# Patient Record
Sex: Female | Born: 1945
Health system: Southern US, Community
[De-identification: ages and names within clinical notes are randomized; demographics above are authoritative.]

## PROBLEM LIST (undated history)

## (undated) DIAGNOSIS — F438 Other reactions to severe stress: Secondary | ICD-10-CM

## (undated) DIAGNOSIS — C801 Malignant (primary) neoplasm, unspecified: Secondary | ICD-10-CM

## (undated) DIAGNOSIS — G5600 Carpal tunnel syndrome, unspecified upper limb: Secondary | ICD-10-CM

## (undated) DIAGNOSIS — Z86718 Personal history of other venous thrombosis and embolism: Secondary | ICD-10-CM

## (undated) DIAGNOSIS — Z7901 Long term (current) use of anticoagulants: Secondary | ICD-10-CM

## (undated) DIAGNOSIS — K219 Gastro-esophageal reflux disease without esophagitis: Secondary | ICD-10-CM

## (undated) DIAGNOSIS — R002 Palpitations: Secondary | ICD-10-CM

## (undated) DIAGNOSIS — E049 Nontoxic goiter, unspecified: Secondary | ICD-10-CM

## (undated) DIAGNOSIS — K8020 Calculus of gallbladder without cholecystitis without obstruction: Secondary | ICD-10-CM

## (undated) DIAGNOSIS — N189 Chronic kidney disease, unspecified: Secondary | ICD-10-CM

## (undated) DIAGNOSIS — F5089 Other specified eating disorder: Secondary | ICD-10-CM

## (undated) DIAGNOSIS — M1990 Unspecified osteoarthritis, unspecified site: Secondary | ICD-10-CM

## (undated) DIAGNOSIS — M353 Polymyalgia rheumatica: Secondary | ICD-10-CM

## (undated) DIAGNOSIS — R011 Cardiac murmur, unspecified: Secondary | ICD-10-CM

## (undated) DIAGNOSIS — I10 Essential (primary) hypertension: Secondary | ICD-10-CM

## (undated) DIAGNOSIS — Z86711 Personal history of pulmonary embolism: Secondary | ICD-10-CM

## (undated) DIAGNOSIS — I2699 Other pulmonary embolism without acute cor pulmonale: Secondary | ICD-10-CM

## (undated) DIAGNOSIS — M7712 Lateral epicondylitis, left elbow: Secondary | ICD-10-CM

## (undated) DIAGNOSIS — IMO0002 Reserved for concepts with insufficient information to code with codable children: Secondary | ICD-10-CM

## (undated) DIAGNOSIS — M546 Pain in thoracic spine: Secondary | ICD-10-CM

## (undated) DIAGNOSIS — T7840XA Allergy, unspecified, initial encounter: Secondary | ICD-10-CM

## (undated) DIAGNOSIS — D509 Iron deficiency anemia, unspecified: Secondary | ICD-10-CM

## (undated) DIAGNOSIS — E119 Type 2 diabetes mellitus without complications: Secondary | ICD-10-CM

## (undated) DIAGNOSIS — M65342 Trigger finger, left ring finger: Secondary | ICD-10-CM

## (undated) DIAGNOSIS — Z86010 Personal history of colonic polyps: Secondary | ICD-10-CM

## (undated) DIAGNOSIS — N19 Unspecified kidney failure: Secondary | ICD-10-CM

## (undated) DIAGNOSIS — E079 Disorder of thyroid, unspecified: Secondary | ICD-10-CM

## (undated) DIAGNOSIS — E785 Hyperlipidemia, unspecified: Secondary | ICD-10-CM

## (undated) DIAGNOSIS — D352 Benign neoplasm of pituitary gland: Secondary | ICD-10-CM

## (undated) HISTORY — DX: Personal history of other venous thrombosis and embolism: Z86.718

## (undated) HISTORY — PX: KNEE ARTHROSCOPY: SUR90

## (undated) HISTORY — DX: Type 2 diabetes mellitus without complications: E11.9

## (undated) HISTORY — DX: Essential (primary) hypertension: I10

## (undated) HISTORY — DX: Nontoxic goiter, unspecified: E04.9

## (undated) HISTORY — DX: Unspecified kidney failure: N19

## (undated) HISTORY — DX: Benign neoplasm of pituitary gland: D35.2

## (undated) HISTORY — DX: Trigger finger, left ring finger: M65.342

## (undated) HISTORY — DX: Gastro-esophageal reflux disease without esophagitis: K21.9

## (undated) HISTORY — PX: TUBAL LIGATION: SHX77

## (undated) HISTORY — DX: Pain in thoracic spine: M54.6

## (undated) HISTORY — DX: Carpal tunnel syndrome, unspecified upper limb: G56.00

## (undated) HISTORY — DX: Disorder of thyroid, unspecified: E07.9

## (undated) HISTORY — DX: Allergy, unspecified, initial encounter: T78.40XA

## (undated) HISTORY — DX: Lateral epicondylitis, left elbow: M77.12

## (undated) HISTORY — DX: Personal history of colonic polyps: Z86.010

## (undated) HISTORY — PX: UPPER GASTROINTESTINAL ENDOSCOPY: SHX188

## (undated) HISTORY — DX: Calculus of gallbladder without cholecystitis without obstruction: K80.20

## (undated) HISTORY — DX: Reserved for concepts with insufficient information to code with codable children: IMO0002

## (undated) HISTORY — DX: Iron deficiency anemia, unspecified: D50.9

## (undated) HISTORY — DX: Other reactions to severe stress: F43.8

## (undated) HISTORY — DX: Other pulmonary embolism without acute cor pulmonale: I26.99

## (undated) HISTORY — DX: Other specified eating disorder: F50.89

## (undated) HISTORY — DX: Polymyalgia rheumatica: M35.3

## (undated) HISTORY — DX: Chronic kidney disease, unspecified: N18.9

## (undated) HISTORY — DX: Unspecified osteoarthritis, unspecified site: M19.90

## (undated) HISTORY — DX: Long term (current) use of anticoagulants: Z79.01

## (undated) HISTORY — DX: Palpitations: R00.2

## (undated) HISTORY — PX: ABDOMINAL HYSTERECTOMY: SHX81

## (undated) HISTORY — PX: COLONOSCOPY: SHX174

## (undated) HISTORY — DX: Personal history of pulmonary embolism: Z86.711

## (undated) HISTORY — PX: OTHER SURGICAL HISTORY: SHX169

## (undated) HISTORY — DX: Hyperlipidemia, unspecified: E78.5

---

## 1994-08-11 HISTORY — PX: CARPAL TUNNEL RELEASE: SHX101

## 1999-08-26 ENCOUNTER — Ambulatory Visit (HOSPITAL_COMMUNITY): Admission: RE | Admit: 1999-08-26 | Discharge: 1999-08-26 | Payer: Self-pay | Admitting: Internal Medicine

## 1999-08-26 ENCOUNTER — Encounter: Payer: Self-pay | Admitting: Internal Medicine

## 2000-06-10 ENCOUNTER — Other Ambulatory Visit: Admission: RE | Admit: 2000-06-10 | Discharge: 2000-06-10 | Payer: Self-pay | Admitting: Obstetrics and Gynecology

## 2000-07-31 ENCOUNTER — Encounter: Payer: Self-pay | Admitting: Emergency Medicine

## 2000-07-31 ENCOUNTER — Emergency Department (HOSPITAL_COMMUNITY): Admission: EM | Admit: 2000-07-31 | Discharge: 2000-07-31 | Payer: Self-pay | Admitting: Emergency Medicine

## 2000-10-24 ENCOUNTER — Ambulatory Visit (HOSPITAL_COMMUNITY): Admission: RE | Admit: 2000-10-24 | Discharge: 2000-10-24 | Payer: Self-pay | Admitting: Internal Medicine

## 2000-10-24 ENCOUNTER — Encounter: Payer: Self-pay | Admitting: Internal Medicine

## 2001-07-07 ENCOUNTER — Other Ambulatory Visit: Admission: RE | Admit: 2001-07-07 | Discharge: 2001-07-07 | Payer: Self-pay | Admitting: Obstetrics and Gynecology

## 2001-09-24 ENCOUNTER — Encounter: Payer: Self-pay | Admitting: Emergency Medicine

## 2001-09-24 ENCOUNTER — Emergency Department (HOSPITAL_COMMUNITY): Admission: EM | Admit: 2001-09-24 | Discharge: 2001-09-24 | Payer: Self-pay | Admitting: Emergency Medicine

## 2001-11-10 ENCOUNTER — Encounter: Admission: RE | Admit: 2001-11-10 | Discharge: 2001-11-10 | Payer: Self-pay | Admitting: Family Medicine

## 2001-12-14 ENCOUNTER — Encounter: Admission: RE | Admit: 2001-12-14 | Discharge: 2001-12-14 | Payer: Self-pay | Admitting: Family Medicine

## 2002-01-29 ENCOUNTER — Encounter: Admission: RE | Admit: 2002-01-29 | Discharge: 2002-01-29 | Payer: Self-pay | Admitting: Orthopedic Surgery

## 2002-01-29 ENCOUNTER — Encounter: Payer: Self-pay | Admitting: Orthopedic Surgery

## 2002-02-14 ENCOUNTER — Encounter: Admission: RE | Admit: 2002-02-14 | Discharge: 2002-02-14 | Payer: Self-pay | Admitting: Sports Medicine

## 2002-03-02 ENCOUNTER — Encounter: Admission: RE | Admit: 2002-03-02 | Discharge: 2002-03-02 | Payer: Self-pay | Admitting: Family Medicine

## 2002-03-08 ENCOUNTER — Ambulatory Visit (HOSPITAL_COMMUNITY): Admission: RE | Admit: 2002-03-08 | Discharge: 2002-03-08 | Payer: Self-pay | Admitting: Cardiology

## 2002-03-10 ENCOUNTER — Encounter: Payer: Self-pay | Admitting: *Deleted

## 2002-03-10 ENCOUNTER — Ambulatory Visit (HOSPITAL_COMMUNITY): Admission: RE | Admit: 2002-03-10 | Discharge: 2002-03-10 | Payer: Self-pay | Admitting: *Deleted

## 2002-04-07 ENCOUNTER — Encounter: Admission: RE | Admit: 2002-04-07 | Discharge: 2002-04-07 | Payer: Self-pay | Admitting: Family Medicine

## 2002-05-04 ENCOUNTER — Encounter: Admission: RE | Admit: 2002-05-04 | Discharge: 2002-05-04 | Payer: Self-pay | Admitting: Family Medicine

## 2002-06-15 ENCOUNTER — Encounter: Admission: RE | Admit: 2002-06-15 | Discharge: 2002-06-15 | Payer: Self-pay | Admitting: Family Medicine

## 2002-07-12 ENCOUNTER — Other Ambulatory Visit: Admission: RE | Admit: 2002-07-12 | Discharge: 2002-07-12 | Payer: Self-pay | Admitting: Obstetrics and Gynecology

## 2002-08-26 ENCOUNTER — Encounter: Admission: RE | Admit: 2002-08-26 | Discharge: 2002-08-26 | Payer: Self-pay | Admitting: Family Medicine

## 2002-11-04 ENCOUNTER — Encounter: Admission: RE | Admit: 2002-11-04 | Discharge: 2002-11-04 | Payer: Self-pay | Admitting: Family Medicine

## 2002-11-07 ENCOUNTER — Encounter: Admission: RE | Admit: 2002-11-07 | Discharge: 2002-11-07 | Payer: Self-pay | Admitting: Family Medicine

## 2002-11-08 ENCOUNTER — Encounter: Admission: RE | Admit: 2002-11-08 | Discharge: 2002-11-08 | Payer: Self-pay | Admitting: Family Medicine

## 2002-11-11 ENCOUNTER — Emergency Department (HOSPITAL_COMMUNITY): Admission: EM | Admit: 2002-11-11 | Discharge: 2002-11-11 | Payer: Self-pay | Admitting: Emergency Medicine

## 2002-11-11 ENCOUNTER — Encounter: Payer: Self-pay | Admitting: Emergency Medicine

## 2002-12-20 ENCOUNTER — Encounter: Admission: RE | Admit: 2002-12-20 | Discharge: 2002-12-20 | Payer: Self-pay | Admitting: Family Medicine

## 2002-12-27 ENCOUNTER — Encounter: Admission: RE | Admit: 2002-12-27 | Discharge: 2002-12-27 | Payer: Self-pay | Admitting: Family Medicine

## 2003-01-06 ENCOUNTER — Ambulatory Visit (HOSPITAL_BASED_OUTPATIENT_CLINIC_OR_DEPARTMENT_OTHER): Admission: RE | Admit: 2003-01-06 | Discharge: 2003-01-06 | Payer: Self-pay | Admitting: Orthopedic Surgery

## 2003-01-18 ENCOUNTER — Ambulatory Visit (HOSPITAL_COMMUNITY): Admission: RE | Admit: 2003-01-18 | Discharge: 2003-01-18 | Payer: Self-pay | Admitting: *Deleted

## 2003-01-18 ENCOUNTER — Encounter: Admission: RE | Admit: 2003-01-18 | Discharge: 2003-01-18 | Payer: Self-pay | Admitting: Family Medicine

## 2003-01-26 ENCOUNTER — Encounter: Admission: RE | Admit: 2003-01-26 | Discharge: 2003-01-26 | Payer: Self-pay | Admitting: Family Medicine

## 2003-04-25 ENCOUNTER — Encounter: Admission: RE | Admit: 2003-04-25 | Discharge: 2003-04-25 | Payer: Self-pay | Admitting: Sports Medicine

## 2003-07-11 ENCOUNTER — Ambulatory Visit (HOSPITAL_COMMUNITY): Admission: RE | Admit: 2003-07-11 | Discharge: 2003-07-11 | Payer: Self-pay | Admitting: Orthopedic Surgery

## 2003-08-02 ENCOUNTER — Encounter: Admission: RE | Admit: 2003-08-02 | Discharge: 2003-08-02 | Payer: Self-pay | Admitting: Sports Medicine

## 2003-08-07 ENCOUNTER — Other Ambulatory Visit: Admission: RE | Admit: 2003-08-07 | Discharge: 2003-08-07 | Payer: Self-pay | Admitting: Obstetrics and Gynecology

## 2003-10-19 ENCOUNTER — Encounter: Admission: RE | Admit: 2003-10-19 | Discharge: 2003-10-19 | Payer: Self-pay | Admitting: Sports Medicine

## 2004-03-28 ENCOUNTER — Encounter: Admission: RE | Admit: 2004-03-28 | Discharge: 2004-03-28 | Payer: Self-pay | Admitting: Sports Medicine

## 2004-04-26 ENCOUNTER — Ambulatory Visit: Payer: Self-pay | Admitting: Family Medicine

## 2004-08-19 ENCOUNTER — Ambulatory Visit: Payer: Self-pay | Admitting: Family Medicine

## 2004-12-04 ENCOUNTER — Ambulatory Visit: Payer: Self-pay | Admitting: Family Medicine

## 2004-12-10 ENCOUNTER — Ambulatory Visit: Payer: Self-pay | Admitting: Family Medicine

## 2004-12-10 ENCOUNTER — Ambulatory Visit (HOSPITAL_COMMUNITY): Admission: RE | Admit: 2004-12-10 | Discharge: 2004-12-10 | Payer: Self-pay | Admitting: Family Medicine

## 2004-12-11 ENCOUNTER — Ambulatory Visit: Payer: Self-pay | Admitting: Family Medicine

## 2005-01-07 ENCOUNTER — Ambulatory Visit: Payer: Self-pay | Admitting: Family Medicine

## 2005-01-27 ENCOUNTER — Ambulatory Visit: Payer: Self-pay | Admitting: Family Medicine

## 2005-06-20 ENCOUNTER — Ambulatory Visit: Payer: Self-pay | Admitting: Family Medicine

## 2005-07-07 ENCOUNTER — Other Ambulatory Visit: Admission: RE | Admit: 2005-07-07 | Discharge: 2005-07-07 | Payer: Self-pay | Admitting: Obstetrics and Gynecology

## 2005-07-09 ENCOUNTER — Ambulatory Visit (HOSPITAL_BASED_OUTPATIENT_CLINIC_OR_DEPARTMENT_OTHER): Admission: RE | Admit: 2005-07-09 | Discharge: 2005-07-09 | Payer: Self-pay | Admitting: Orthopedic Surgery

## 2005-07-09 ENCOUNTER — Ambulatory Visit (HOSPITAL_COMMUNITY): Admission: RE | Admit: 2005-07-09 | Discharge: 2005-07-09 | Payer: Self-pay | Admitting: Orthopedic Surgery

## 2005-09-01 ENCOUNTER — Ambulatory Visit: Payer: Self-pay | Admitting: Sports Medicine

## 2005-09-08 ENCOUNTER — Ambulatory Visit (HOSPITAL_COMMUNITY): Admission: RE | Admit: 2005-09-08 | Discharge: 2005-09-08 | Payer: Self-pay | Admitting: Family Medicine

## 2005-10-24 ENCOUNTER — Ambulatory Visit: Payer: Self-pay | Admitting: Sports Medicine

## 2005-11-06 ENCOUNTER — Encounter: Admission: RE | Admit: 2005-11-06 | Discharge: 2005-11-06 | Payer: Self-pay | Admitting: Sports Medicine

## 2005-11-06 ENCOUNTER — Ambulatory Visit: Payer: Self-pay | Admitting: Family Medicine

## 2006-01-15 ENCOUNTER — Emergency Department (HOSPITAL_COMMUNITY): Admission: EM | Admit: 2006-01-15 | Discharge: 2006-01-15 | Payer: Self-pay | Admitting: Emergency Medicine

## 2006-05-18 ENCOUNTER — Ambulatory Visit: Payer: Self-pay | Admitting: Family Medicine

## 2006-05-21 ENCOUNTER — Ambulatory Visit: Payer: Self-pay | Admitting: Family Medicine

## 2006-06-04 ENCOUNTER — Encounter: Admission: RE | Admit: 2006-06-04 | Discharge: 2006-06-26 | Payer: Self-pay | Admitting: Chiropractic Medicine

## 2006-06-04 ENCOUNTER — Ambulatory Visit: Payer: Self-pay | Admitting: Family Medicine

## 2006-06-22 ENCOUNTER — Ambulatory Visit: Payer: Self-pay | Admitting: Sports Medicine

## 2006-08-06 ENCOUNTER — Ambulatory Visit: Payer: Self-pay | Admitting: Family Medicine

## 2006-08-11 ENCOUNTER — Encounter (INDEPENDENT_AMBULATORY_CARE_PROVIDER_SITE_OTHER): Payer: Self-pay | Admitting: *Deleted

## 2006-08-11 LAB — CONVERTED CEMR LAB

## 2006-08-24 ENCOUNTER — Ambulatory Visit (HOSPITAL_COMMUNITY): Admission: RE | Admit: 2006-08-24 | Discharge: 2006-08-24 | Payer: Self-pay | Admitting: Family Medicine

## 2006-08-24 ENCOUNTER — Ambulatory Visit: Payer: Self-pay | Admitting: Family Medicine

## 2006-09-04 ENCOUNTER — Ambulatory Visit: Payer: Self-pay

## 2006-09-04 ENCOUNTER — Encounter: Payer: Self-pay | Admitting: Cardiology

## 2006-09-08 ENCOUNTER — Ambulatory Visit: Payer: Self-pay

## 2006-09-29 ENCOUNTER — Encounter (INDEPENDENT_AMBULATORY_CARE_PROVIDER_SITE_OTHER): Payer: Self-pay | Admitting: Family Medicine

## 2006-09-29 ENCOUNTER — Ambulatory Visit: Payer: Self-pay | Admitting: Family Medicine

## 2006-09-29 LAB — CONVERTED CEMR LAB
BUN: 11 mg/dL (ref 6–23)
CO2: 29 meq/L (ref 19–32)
Calcium: 9.6 mg/dL (ref 8.4–10.5)
Chloride: 101 meq/L (ref 96–112)
Creatinine, Ser: 1.03 mg/dL (ref 0.40–1.20)
Glucose, Bld: 81 mg/dL (ref 70–99)
Potassium: 4.7 meq/L (ref 3.5–5.3)
Sodium: 139 meq/L (ref 135–145)

## 2006-10-08 DIAGNOSIS — K219 Gastro-esophageal reflux disease without esophagitis: Secondary | ICD-10-CM | POA: Insufficient documentation

## 2006-10-08 DIAGNOSIS — E669 Obesity, unspecified: Secondary | ICD-10-CM | POA: Insufficient documentation

## 2006-10-08 DIAGNOSIS — M159 Polyosteoarthritis, unspecified: Secondary | ICD-10-CM | POA: Insufficient documentation

## 2006-10-08 DIAGNOSIS — IMO0002 Reserved for concepts with insufficient information to code with codable children: Secondary | ICD-10-CM | POA: Insufficient documentation

## 2006-10-08 HISTORY — DX: Gastro-esophageal reflux disease without esophagitis: K21.9

## 2006-10-09 ENCOUNTER — Encounter (INDEPENDENT_AMBULATORY_CARE_PROVIDER_SITE_OTHER): Payer: Self-pay | Admitting: *Deleted

## 2006-11-27 ENCOUNTER — Ambulatory Visit: Payer: Self-pay | Admitting: Family Medicine

## 2006-11-27 DIAGNOSIS — I10 Essential (primary) hypertension: Secondary | ICD-10-CM

## 2006-11-27 HISTORY — DX: Essential (primary) hypertension: I10

## 2006-11-27 LAB — CONVERTED CEMR LAB: Hgb A1c MFr Bld: 6 %

## 2006-12-22 ENCOUNTER — Ambulatory Visit: Payer: Self-pay | Admitting: Family Medicine

## 2006-12-22 LAB — CONVERTED CEMR LAB: Blood Glucose, AC Bkfst: 112 mg/dL

## 2007-07-02 ENCOUNTER — Encounter (INDEPENDENT_AMBULATORY_CARE_PROVIDER_SITE_OTHER): Payer: Self-pay | Admitting: Family Medicine

## 2007-08-06 ENCOUNTER — Ambulatory Visit: Payer: Self-pay | Admitting: Family Medicine

## 2007-10-21 ENCOUNTER — Encounter (INDEPENDENT_AMBULATORY_CARE_PROVIDER_SITE_OTHER): Payer: Self-pay | Admitting: Family Medicine

## 2007-10-21 ENCOUNTER — Ambulatory Visit: Payer: Self-pay | Admitting: Sports Medicine

## 2007-10-21 DIAGNOSIS — E049 Nontoxic goiter, unspecified: Secondary | ICD-10-CM

## 2007-10-21 HISTORY — DX: Nontoxic goiter, unspecified: E04.9

## 2007-10-21 LAB — CONVERTED CEMR LAB
ALT: 10 units/L (ref 0–35)
AST: 16 units/L (ref 0–37)
Albumin: 3.8 g/dL (ref 3.5–5.2)
Alkaline Phosphatase: 65 units/L (ref 39–117)
BUN: 9 mg/dL (ref 6–23)
CO2: 25 meq/L (ref 19–32)
Calcium: 9 mg/dL (ref 8.4–10.5)
Chloride: 106 meq/L (ref 96–112)
Creatinine, Ser: 0.94 mg/dL (ref 0.40–1.20)
Direct LDL: 107 mg/dL — ABNORMAL HIGH
Glucose, Bld: 77 mg/dL (ref 70–99)
LDL Cholesterol: 107 mg/dL
Potassium: 4.5 meq/L (ref 3.5–5.3)
Sodium: 143 meq/L (ref 135–145)
TSH: 2.403 microintl units/mL (ref 0.350–5.50)
Total Bilirubin: 0.5 mg/dL (ref 0.3–1.2)
Total Protein: 8.2 g/dL (ref 6.0–8.3)

## 2007-10-22 ENCOUNTER — Encounter (INDEPENDENT_AMBULATORY_CARE_PROVIDER_SITE_OTHER): Payer: Self-pay | Admitting: Family Medicine

## 2007-10-26 ENCOUNTER — Telehealth: Payer: Self-pay | Admitting: *Deleted

## 2007-11-23 ENCOUNTER — Ambulatory Visit: Payer: Self-pay | Admitting: Family Medicine

## 2008-01-14 ENCOUNTER — Encounter (INDEPENDENT_AMBULATORY_CARE_PROVIDER_SITE_OTHER): Payer: Self-pay | Admitting: Family Medicine

## 2008-02-15 ENCOUNTER — Ambulatory Visit: Payer: Self-pay | Admitting: Family Medicine

## 2008-02-29 ENCOUNTER — Telehealth: Payer: Self-pay | Admitting: *Deleted

## 2008-06-05 ENCOUNTER — Ambulatory Visit: Payer: Self-pay | Admitting: Family Medicine

## 2008-06-05 DIAGNOSIS — E119 Type 2 diabetes mellitus without complications: Secondary | ICD-10-CM | POA: Insufficient documentation

## 2008-06-05 HISTORY — DX: Type 2 diabetes mellitus without complications: E11.9

## 2008-06-05 LAB — CONVERTED CEMR LAB: Hgb A1c MFr Bld: 6.1 %

## 2008-06-29 ENCOUNTER — Encounter: Payer: Self-pay | Admitting: Family Medicine

## 2008-08-08 ENCOUNTER — Encounter: Payer: Self-pay | Admitting: Family Medicine

## 2008-08-08 DIAGNOSIS — E1169 Type 2 diabetes mellitus with other specified complication: Secondary | ICD-10-CM | POA: Insufficient documentation

## 2008-08-08 DIAGNOSIS — E785 Hyperlipidemia, unspecified: Secondary | ICD-10-CM

## 2008-08-08 HISTORY — DX: Hyperlipidemia, unspecified: E78.5

## 2008-10-31 ENCOUNTER — Encounter: Payer: Self-pay | Admitting: Family Medicine

## 2008-10-31 ENCOUNTER — Ambulatory Visit: Payer: Self-pay | Admitting: Family Medicine

## 2008-10-31 LAB — CONVERTED CEMR LAB: Hgb A1c MFr Bld: 6.1 %

## 2008-11-02 ENCOUNTER — Encounter: Payer: Self-pay | Admitting: Family Medicine

## 2008-11-02 LAB — CONVERTED CEMR LAB
ALT: 13 units/L (ref 0–35)
AST: 15 units/L (ref 0–37)
Albumin: 3.6 g/dL (ref 3.5–5.2)
Alkaline Phosphatase: 71 units/L (ref 39–117)
BUN: 14 mg/dL (ref 6–23)
CO2: 23 meq/L (ref 19–32)
Calcium: 9.2 mg/dL (ref 8.4–10.5)
Chloride: 106 meq/L (ref 96–112)
Cholesterol: 187 mg/dL (ref 0–200)
Creatinine, Ser: 1.02 mg/dL (ref 0.40–1.20)
Glucose, Bld: 91 mg/dL (ref 70–99)
HDL: 63 mg/dL (ref >39)
LDL Cholesterol: 113 mg/dL — ABNORMAL HIGH (ref 0–99)
Potassium: 4.2 meq/L (ref 3.5–5.3)
Sodium: 143 meq/L (ref 135–145)
TSH: 2.484 microintl units/mL (ref 0.350–4.500)
Total Bilirubin: 0.6 mg/dL (ref 0.3–1.2)
Total CHOL/HDL Ratio: 3
Total Protein: 8.1 g/dL (ref 6.0–8.3)
Triglycerides: 53 mg/dL (ref <150)
VLDL: 11 mg/dL (ref 0–40)

## 2008-11-14 ENCOUNTER — Encounter: Payer: Self-pay | Admitting: Family Medicine

## 2008-12-07 ENCOUNTER — Ambulatory Visit (HOSPITAL_COMMUNITY): Admission: RE | Admit: 2008-12-07 | Discharge: 2008-12-07 | Payer: Self-pay | Admitting: Family Medicine

## 2008-12-07 ENCOUNTER — Ambulatory Visit: Payer: Self-pay | Admitting: Family Medicine

## 2008-12-18 ENCOUNTER — Ambulatory Visit: Payer: Self-pay

## 2009-04-06 ENCOUNTER — Ambulatory Visit: Payer: Self-pay | Admitting: Family Medicine

## 2009-04-06 DIAGNOSIS — F4389 Other reactions to severe stress: Secondary | ICD-10-CM

## 2009-04-06 DIAGNOSIS — F438 Other reactions to severe stress: Secondary | ICD-10-CM

## 2009-04-06 HISTORY — DX: Other reactions to severe stress: F43.8

## 2009-04-06 HISTORY — DX: Other reactions to severe stress: F43.89

## 2009-04-06 LAB — CONVERTED CEMR LAB: Hgb A1c MFr Bld: 6.1 %

## 2009-05-01 ENCOUNTER — Encounter: Payer: Self-pay | Admitting: Family Medicine

## 2009-08-02 ENCOUNTER — Telehealth: Payer: Self-pay | Admitting: *Deleted

## 2009-08-08 ENCOUNTER — Ambulatory Visit: Payer: Self-pay | Admitting: Family Medicine

## 2009-08-08 ENCOUNTER — Encounter: Payer: Self-pay | Admitting: Family Medicine

## 2009-08-08 DIAGNOSIS — F5089 Other specified eating disorder: Secondary | ICD-10-CM | POA: Insufficient documentation

## 2009-08-08 HISTORY — DX: Other specified eating disorder: F50.89

## 2009-08-08 LAB — CONVERTED CEMR LAB: Hgb A1c MFr Bld: 6.3 %

## 2009-08-09 ENCOUNTER — Encounter: Payer: Self-pay | Admitting: Family Medicine

## 2009-08-09 LAB — CONVERTED CEMR LAB
Ferritin: 61 ng/mL (ref 10–291)
Iron: 34 ug/dL — ABNORMAL LOW (ref 42–145)
Saturation Ratios: 12 % — ABNORMAL LOW (ref 20–55)
TIBC: 288 ug/dL (ref 250–470)
UIBC: 254 ug/dL

## 2009-08-14 LAB — CONVERTED CEMR LAB
ALT: 9 units/L (ref 0–35)
AST: 25 units/L (ref 0–37)
Albumin: 4 g/dL (ref 3.5–5.2)
Alkaline Phosphatase: 76 units/L (ref 39–117)
BUN: 17 mg/dL (ref 6–23)
CO2: 22 meq/L (ref 19–32)
Calcium: 9.2 mg/dL (ref 8.4–10.5)
Chloride: 102 meq/L (ref 96–112)
Creatinine, Ser: 1.41 mg/dL — ABNORMAL HIGH (ref 0.40–1.20)
Glucose, Bld: 88 mg/dL (ref 70–99)
HCT: 32.2 % — ABNORMAL LOW (ref 36.0–46.0)
Hemoglobin: 9.9 g/dL — ABNORMAL LOW (ref 12.0–15.0)
MCHC: 30.7 g/dL (ref 30.0–36.0)
MCV: 85.9 fL (ref 78.0–100.0)
Platelets: 272 10*3/uL (ref 150–400)
Potassium: 4.4 meq/L (ref 3.5–5.3)
RBC: 3.75 M/uL — ABNORMAL LOW (ref 3.87–5.11)
RDW: 16.1 % — ABNORMAL HIGH (ref 11.5–15.5)
Sodium: 137 meq/L (ref 135–145)
Total Bilirubin: 0.5 mg/dL (ref 0.3–1.2)
Total Protein: 8.4 g/dL — ABNORMAL HIGH (ref 6.0–8.3)
WBC: 5.5 10*3/uL (ref 4.0–10.5)

## 2009-08-15 ENCOUNTER — Telehealth: Payer: Self-pay | Admitting: *Deleted

## 2009-09-20 ENCOUNTER — Encounter: Payer: Self-pay | Admitting: Family Medicine

## 2009-10-02 ENCOUNTER — Encounter: Payer: Self-pay | Admitting: Family Medicine

## 2009-10-02 ENCOUNTER — Ambulatory Visit: Payer: Self-pay | Admitting: Family Medicine

## 2009-10-02 DIAGNOSIS — M353 Polymyalgia rheumatica: Secondary | ICD-10-CM | POA: Insufficient documentation

## 2009-10-02 DIAGNOSIS — D509 Iron deficiency anemia, unspecified: Secondary | ICD-10-CM | POA: Insufficient documentation

## 2009-10-02 HISTORY — DX: Polymyalgia rheumatica: M35.3

## 2009-10-10 ENCOUNTER — Encounter (INDEPENDENT_AMBULATORY_CARE_PROVIDER_SITE_OTHER): Payer: Self-pay | Admitting: *Deleted

## 2009-10-10 ENCOUNTER — Telehealth: Payer: Self-pay | Admitting: Family Medicine

## 2009-10-11 ENCOUNTER — Telehealth: Payer: Self-pay | Admitting: Family Medicine

## 2009-10-15 LAB — CONVERTED CEMR LAB
ALT: 11 units/L (ref 0–35)
AST: 17 units/L (ref 0–37)
Albumin: 4.2 g/dL (ref 3.5–5.2)
Alkaline Phosphatase: 70 units/L (ref 39–117)
BUN: 22 mg/dL (ref 6–23)
CO2: 25 meq/L (ref 19–32)
Calcium: 9.7 mg/dL (ref 8.4–10.5)
Chloride: 101 meq/L (ref 96–112)
Creatinine, Ser: 1.45 mg/dL — ABNORMAL HIGH (ref 0.40–1.20)
Glucose, Bld: 91 mg/dL (ref 70–99)
HCT: 33.6 % — ABNORMAL LOW (ref 36.0–46.0)
Hemoglobin: 10.3 g/dL — ABNORMAL LOW (ref 12.0–15.0)
MCHC: 30.7 g/dL (ref 30.0–36.0)
MCV: 86.2 fL (ref 78.0–100.0)
Platelets: 286 10*3/uL (ref 150–400)
Potassium: 4 meq/L (ref 3.5–5.3)
RBC: 3.9 M/uL (ref 3.87–5.11)
RDW: 15.8 % — ABNORMAL HIGH (ref 11.5–15.5)
Sodium: 138 meq/L (ref 135–145)
Total Bilirubin: 0.3 mg/dL (ref 0.3–1.2)
Total Protein: 8.5 g/dL — ABNORMAL HIGH (ref 6.0–8.3)
WBC: 9.1 10*3/uL (ref 4.0–10.5)

## 2009-10-18 ENCOUNTER — Ambulatory Visit: Payer: Self-pay | Admitting: Family Medicine

## 2009-10-18 ENCOUNTER — Encounter: Payer: Self-pay | Admitting: Family Medicine

## 2009-10-18 DIAGNOSIS — N19 Unspecified kidney failure: Secondary | ICD-10-CM | POA: Insufficient documentation

## 2009-10-18 LAB — CONVERTED CEMR LAB
ANA Titer 1: 1:160 {titer} — ABNORMAL HIGH
Albumin ELP: 45.6 % — ABNORMAL LOW (ref 55.8–66.1)
Alpha-1-Globulin: 4.2 % (ref 2.9–4.9)
Alpha-2-Globulin: 10.5 % (ref 7.1–11.8)
Anti Nuclear Antibody(ANA): POSITIVE — AB
Beta Globulin: 6.7 % (ref 4.7–7.2)
Bilirubin Urine: NEGATIVE
Blood in Urine, dipstick: NEGATIVE
Gamma Globulin: 24.5 % — ABNORMAL HIGH (ref 11.1–18.8)
Glucose, Urine, Semiquant: NEGATIVE
Ketones, urine, test strip: NEGATIVE
Nitrite: NEGATIVE
Protein, U semiquant: NEGATIVE
Specific Gravity, Urine: 1.01
Total Protein, Serum Electrophoresis: 7.9 g/dL (ref 6.0–8.3)
Urobilinogen, UA: 0.2
WBC Urine, dipstick: NEGATIVE
pH: 5.5

## 2009-10-19 ENCOUNTER — Encounter: Payer: Self-pay | Admitting: Family Medicine

## 2009-10-19 ENCOUNTER — Ambulatory Visit (HOSPITAL_COMMUNITY): Admission: RE | Admit: 2009-10-19 | Discharge: 2009-10-19 | Payer: Self-pay | Admitting: Family Medicine

## 2009-10-20 ENCOUNTER — Encounter: Payer: Self-pay | Admitting: Family Medicine

## 2009-10-22 ENCOUNTER — Ambulatory Visit (HOSPITAL_COMMUNITY): Admission: RE | Admit: 2009-10-22 | Discharge: 2009-10-22 | Payer: Self-pay | Admitting: Family Medicine

## 2009-10-23 ENCOUNTER — Telehealth: Payer: Self-pay | Admitting: Family Medicine

## 2009-10-24 ENCOUNTER — Ambulatory Visit: Payer: Self-pay | Admitting: Gastroenterology

## 2009-10-24 ENCOUNTER — Encounter: Payer: Self-pay | Admitting: Family Medicine

## 2009-10-24 DIAGNOSIS — Z860100 Personal history of colon polyps, unspecified: Secondary | ICD-10-CM | POA: Insufficient documentation

## 2009-10-24 HISTORY — DX: Personal history of colonic polyps: Z86.010

## 2009-10-24 HISTORY — DX: Personal history of colon polyps, unspecified: Z86.0100

## 2009-10-26 LAB — CONVERTED CEMR LAB
Albumin, U: DETECTED %
Alpha 1, Urine: DETECTED % — AB
Alpha 2, Urine: DETECTED % — AB
Beta, Urine: DETECTED % — AB
Collection Interval-CRCL: 24 hr
Creatinine 24 HR UR: 2108 mg/24hr — ABNORMAL HIGH (ref 700–1800)
Creatinine Clearance: 91 mL/min (ref 75–115)
Creatinine, Urine: 75.3 mg/dL
Free Kappa Lt Chains,Ur: 12.8 mg/dL — ABNORMAL HIGH (ref 0.04–1.51)
Free Kappa/Lambda Ratio: 12.31 — ABNORMAL HIGH (ref 0.46–4.00)
Free Lambda Lt Chains,Ur: 1.04 mg/dL — ABNORMAL HIGH (ref 0.08–1.01)
Gamma Globulin, Urine: DETECTED % — AB
Protein, Ur: 84 mg/24hr (ref 50–100)
Time: 24
Total Protein, Urine-Ur/day: 400 mg/24hr — ABNORMAL HIGH (ref 10–140)
Total Protein, Urine: 14.3 mg/dL
Volume, Urine: 2800 mL

## 2009-10-29 ENCOUNTER — Telehealth (INDEPENDENT_AMBULATORY_CARE_PROVIDER_SITE_OTHER): Payer: Self-pay | Admitting: Family Medicine

## 2009-11-02 ENCOUNTER — Telehealth (INDEPENDENT_AMBULATORY_CARE_PROVIDER_SITE_OTHER): Payer: Self-pay | Admitting: *Deleted

## 2009-11-02 ENCOUNTER — Ambulatory Visit: Payer: Self-pay | Admitting: Gastroenterology

## 2009-11-02 LAB — CONVERTED CEMR LAB: Fecal Occult Bld: NEGATIVE

## 2009-11-06 ENCOUNTER — Ambulatory Visit: Payer: Self-pay | Admitting: Gastroenterology

## 2009-11-06 ENCOUNTER — Ambulatory Visit (HOSPITAL_COMMUNITY): Admission: RE | Admit: 2009-11-06 | Discharge: 2009-11-06 | Payer: Self-pay | Admitting: Gastroenterology

## 2009-11-07 ENCOUNTER — Encounter (INDEPENDENT_AMBULATORY_CARE_PROVIDER_SITE_OTHER): Payer: Self-pay | Admitting: *Deleted

## 2009-11-12 ENCOUNTER — Encounter: Payer: Self-pay | Admitting: Family Medicine

## 2009-11-12 ENCOUNTER — Ambulatory Visit: Payer: Self-pay | Admitting: Family Medicine

## 2009-11-12 LAB — CONVERTED CEMR LAB: Hgb A1c MFr Bld: 6.7 %

## 2009-11-13 ENCOUNTER — Encounter: Payer: Self-pay | Admitting: Family Medicine

## 2009-11-13 ENCOUNTER — Telehealth: Payer: Self-pay | Admitting: *Deleted

## 2009-11-14 LAB — CONVERTED CEMR LAB
ALT: 11 units/L (ref 0–35)
AST: 19 units/L (ref 0–37)
Albumin: 3.9 g/dL (ref 3.5–5.2)
Alkaline Phosphatase: 63 units/L (ref 39–117)
BUN: 21 mg/dL (ref 6–23)
CO2: 22 meq/L (ref 19–32)
Calcium: 9.5 mg/dL (ref 8.4–10.5)
Chloride: 105 meq/L (ref 96–112)
Cholesterol: 176 mg/dL (ref 0–200)
Creatinine, Ser: 1.43 mg/dL — ABNORMAL HIGH (ref 0.40–1.20)
Glucose, Bld: 96 mg/dL (ref 70–99)
HCT: 32.4 % — ABNORMAL LOW (ref 36.0–46.0)
HDL: 56 mg/dL (ref >39)
Hemoglobin: 9.9 g/dL — ABNORMAL LOW (ref 12.0–15.0)
LDL Cholesterol: 108 mg/dL — ABNORMAL HIGH (ref 0–99)
MCHC: 30.6 g/dL (ref 30.0–36.0)
MCV: 86.4 fL (ref 78.0–100.0)
Platelets: 252 10*3/uL (ref 150–400)
Potassium: 4.4 meq/L (ref 3.5–5.3)
RBC: 3.75 M/uL — ABNORMAL LOW (ref 3.87–5.11)
RDW: 16.1 % — ABNORMAL HIGH (ref 11.5–15.5)
Sodium: 141 meq/L (ref 135–145)
TSH: 1.3 microintl units/mL (ref 0.350–4.500)
Total Bilirubin: 0.4 mg/dL (ref 0.3–1.2)
Total CHOL/HDL Ratio: 3.1
Total Protein: 8.2 g/dL (ref 6.0–8.3)
Triglycerides: 61 mg/dL (ref <150)
VLDL: 12 mg/dL (ref 0–40)
WBC: 4.6 10*3/uL (ref 4.0–10.5)

## 2009-11-23 ENCOUNTER — Telehealth: Payer: Self-pay | Admitting: Family Medicine

## 2009-11-23 ENCOUNTER — Encounter: Payer: Self-pay | Admitting: Family Medicine

## 2009-11-26 ENCOUNTER — Telehealth: Payer: Self-pay | Admitting: Family Medicine

## 2009-11-29 ENCOUNTER — Ambulatory Visit: Payer: Self-pay | Admitting: Family Medicine

## 2009-12-07 ENCOUNTER — Ambulatory Visit (HOSPITAL_COMMUNITY)
Admission: RE | Admit: 2009-12-07 | Discharge: 2009-12-07 | Payer: Self-pay | Source: Home / Self Care | Admitting: Family Medicine

## 2009-12-12 ENCOUNTER — Telehealth: Payer: Self-pay | Admitting: Family Medicine

## 2010-01-22 ENCOUNTER — Encounter: Payer: Self-pay | Admitting: Family Medicine

## 2010-01-31 ENCOUNTER — Encounter: Payer: Self-pay | Admitting: Family Medicine

## 2010-02-04 ENCOUNTER — Encounter: Payer: Self-pay | Admitting: Family Medicine

## 2010-02-04 ENCOUNTER — Ambulatory Visit: Payer: Self-pay | Admitting: Family Medicine

## 2010-02-05 LAB — CONVERTED CEMR LAB
ALT: <8 units/L (ref 0–35)
AST: 14 units/L (ref 0–37)
Albumin: 3.6 g/dL (ref 3.5–5.2)
Alkaline Phosphatase: 61 units/L (ref 39–117)
BUN: 19 mg/dL (ref 6–23)
Basophils Absolute: 0 10*3/uL (ref 0.0–0.1)
Basophils Relative: 0 % (ref 0–1)
CO2: 23 meq/L (ref 19–32)
Calcium: 9.2 mg/dL (ref 8.4–10.5)
Chloride: 108 meq/L (ref 96–112)
Creatinine, Ser: 1.34 mg/dL — ABNORMAL HIGH (ref 0.40–1.20)
Eosinophils Absolute: 0.1 10*3/uL (ref 0.0–0.7)
Eosinophils Relative: 3 % (ref 0–5)
Glucose, Bld: 93 mg/dL (ref 70–99)
HCT: 32.5 % — ABNORMAL LOW (ref 36.0–46.0)
Hemoglobin: 10 g/dL — ABNORMAL LOW (ref 12.0–15.0)
Lymphocytes Relative: 44 % (ref 12–46)
Lymphs Abs: 1.7 10*3/uL (ref 0.7–4.0)
MCHC: 30.8 g/dL (ref 30.0–36.0)
MCV: 87.1 fL (ref 78.0–100.0)
Monocytes Absolute: 0.4 10*3/uL (ref 0.1–1.0)
Monocytes Relative: 10 % (ref 3–12)
Neutro Abs: 1.7 10*3/uL (ref 1.7–7.7)
Neutrophils Relative %: 42 % — ABNORMAL LOW (ref 43–77)
Platelets: 232 10*3/uL (ref 150–400)
Potassium: 4.4 meq/L (ref 3.5–5.3)
RBC: 3.73 M/uL — ABNORMAL LOW (ref 3.87–5.11)
RDW: 16.8 % — ABNORMAL HIGH (ref 11.5–15.5)
Sodium: 141 meq/L (ref 135–145)
Total Bilirubin: 0.5 mg/dL (ref 0.3–1.2)
Total Protein: 8 g/dL (ref 6.0–8.3)
WBC: 3.9 10*3/uL — ABNORMAL LOW (ref 4.0–10.5)

## 2010-02-08 ENCOUNTER — Telehealth: Payer: Self-pay | Admitting: Family Medicine

## 2010-02-26 ENCOUNTER — Encounter: Payer: Self-pay | Admitting: Family Medicine

## 2010-05-07 ENCOUNTER — Telehealth: Payer: Self-pay | Admitting: Family Medicine

## 2010-05-17 ENCOUNTER — Telehealth: Payer: Self-pay | Admitting: *Deleted

## 2010-05-28 ENCOUNTER — Telehealth (INDEPENDENT_AMBULATORY_CARE_PROVIDER_SITE_OTHER): Payer: Self-pay | Admitting: *Deleted

## 2010-06-13 ENCOUNTER — Ambulatory Visit: Payer: Self-pay | Admitting: Family Medicine

## 2010-06-13 ENCOUNTER — Encounter: Payer: Self-pay | Admitting: Family Medicine

## 2010-06-13 DIAGNOSIS — M6530 Trigger finger, unspecified finger: Secondary | ICD-10-CM | POA: Insufficient documentation

## 2010-06-13 LAB — CONVERTED CEMR LAB
HCT: 32.7 % — ABNORMAL LOW (ref 36.0–46.0)
Hemoglobin: 10.3 g/dL — ABNORMAL LOW (ref 12.0–15.0)
Hgb A1c MFr Bld: 6.1 %
MCHC: 31.5 g/dL (ref 30.0–36.0)
MCV: 87.9 fL (ref 78.0–100.0)
Platelets: 252 10*3/uL (ref 150–400)
RBC: 3.72 M/uL — ABNORMAL LOW (ref 3.87–5.11)
RDW: 16.2 % — ABNORMAL HIGH (ref 11.5–15.5)
WBC: 5.4 10*3/uL (ref 4.0–10.5)

## 2010-06-18 ENCOUNTER — Encounter: Payer: Self-pay | Admitting: Family Medicine

## 2010-08-06 ENCOUNTER — Encounter: Payer: Self-pay | Admitting: Family Medicine

## 2010-08-14 ENCOUNTER — Telehealth: Payer: Self-pay | Admitting: Family Medicine

## 2010-09-10 NOTE — Consult Note (Signed)
Summary: [facility]   Imported By: Raymond Gurney 12/03/2009 12:11:37  _____________________________________________________________________  External Attachment:    Type:   Image     Comment:   External Document

## 2010-09-10 NOTE — Assessment & Plan Note (Signed)
Summary: f/u,df   Vital Signs:  Patient profile:   65 year old female Height:      69 inches Weight:      307.3 pounds BMI:     45.54 Temp:     98.2 degrees F oral Pulse rate:   71 / minute BP sitting:   129 / 67  (left arm) Cuff size:   large  Vitals Entered By: Levert Feinstein LPN (August 27, 624THL 10:27 AM) CC: f/u dm, HTN, arthritis, anxiety Is Patient Diabetic? Yes  Pain Assessment Patient in pain? yes     Location: all over   Primary Care Provider:  Patria Mane  MD  CC:  f/u dm, HTN, arthritis, and anxiety.  History of Present Illness: DM: overall doing well. taking meds as prescribed without side effects.  not checking sugars at home.  A1C reviewed today - excellent. has lost some weight since last visit.   HTN: taking meds without problems. no CP, SOB, dizziness, edema.     arthritis: pain not under good control.  arthritis in shoulder helped with steroid injection [facility] but is wearing off.  diclofenac isn't helping. hasn't tried anything like ultram before.  anxiety: situational. has some dental work coming up and thinks her anxiety is so bad she may need to be put to sleep for it.  would like to know if there is a pill to hlep anxiety so she won't have to pay even more out of pocket for her dental work.   Current Medications (verified): 1)  Aspir-81 81 Mg Tbec (Aspirin) .... Take 1 Tablet By Mouth Every Morning 2)  Diovan Hct 320-25 Mg Tabs (Valsartan-Hydrochlorothiazide) .[name].. 1 By Mouth Once Daily For Blood Pressure 3)  Metformin Hcl 500 Mg Tabs (Metformin Hcl) .[name].. 1 Po Two Times A Day For Diabetes 4)  Metoprolol Tartrate 50 Mg Tabs (Metoprolol Tartrate) .... Take 1 Tablet By Mouth Twice A Day 5)  Prilosec 20 Mg Cpdr (Omeprazole) .... Take 1 Capsule By Mouth Once A Day 6)  Tramadol Hcl 50 Mg Tabs (Tramadol Hcl) .[name].. 1 By Mouth Three Times A Day As Needed Pain.  Works [employer] When Taken With Tylenol. 7)  Alprazolam 0.5 Mg Tabs (Alprazolam) .[name].. 1-2 By  Mouth As Needed Severe Anxiety/dental Visit  Allergies (verified): 1)  Lisinopril (Lisinopril)  Past History:  Past medical, surgical, family and social histories (including risk factors) reviewed for relevance to current acute and chronic problems.  Past Medical History: Reviewed history from 10/31/2008 and no changes required. H/O Redux use,  hyperthryroidism resolved 06/04-PTU d/c`d, -- goiter Recurrent vulvovaginitis HTN Obesity Paresthesias Osteoarthrtis/ DJD GERD Back pain DM2.  stress echo normal 02/08  Past Surgical History: Reviewed history from 10/31/2008 and no changes required. ABI=1.06 (normal) - 12/14/2001 carpel tunnel release - 11/10/2001 EGD nl 12/2002 - 01/18/2003 ECHO 1/08: mild, subtle inferior hypokinesis - 09/08/2006 Holter -symptomatic PVCs only 03/00 - 01/18/2003 Hysterectomy/BSO - Supracervical - 11/10/2001 L foot surgery - 11/10/2001 R knee arthroscopy 05/04 Thyroid uptake scan normal - 02/08/2002  Family History: Reviewed history from 10/21/2007 and no changes required. father - DM, HTN, CRF, COPD, motehr - DM, HTN, PUD, MI 3s,  sisters and brothers - DM, HTN Leukemia - sister died at 59 yo Sister died of malnutrition  **early FHx of CAD - diffuse family history ot MI in 64s.  Social History: Reviewed history from 10/21/2007 and no changes required. no tob/ETOH/drugs; widow, 3 grown children Myrna Blazer, Barbette Or);  7 grandchildren and 5 great grandchildren.   On disablility 12/04 for OA knees/spine.  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  Walks occasionally.  Review of Systems       per HPI otherwise negative  Physical Exam  General:  morbidly obese pleasant AAF, NAD Mouth:  poor dentition Lungs:  Normal respiratory effort, chest expands symmetrically. Lungs are clear to auscultation, no crackles or wheezes. Heart:  Normal rate and regular rhythm. S1 and S2 normal without gallop,  click, rub. 2/6 SEM at upper sternal borders bilaterally. Extremities:  no edema.  2+ peripheral pulses.    Impression & Recommendations:  Problem # 1:  DIABETES MELLITUS, TYPE II, CONTROLLED, MILD (ICD-250.00) Assessment Unchanged well controlled.  no changes.   Her updated medication list for this problem includes:    Aspir-81 81 Mg Tbec (Aspirin) .[name]... Take 1 tablet by mouth every morning    Diovan Hct 320-25 Mg Tabs (Valsartan-hydrochlorothiazide) .[name]... 1 by mouth once daily for blood pressure    Metformin Hcl 500 Mg Tabs (Metformin hcl) .[name]... 1 po two times a day for diabetes  Orders: A1C-[facility] [id number]) [place]- Est  Level 4 [id number])  Problem # 2:  HYPERTENSION, BENIGN ESSENTIAL (ICD-401.1) Assessment: Unchanged  well controlled.  no changes. Her updated medication list for this problem includes:    Diovan Hct 320-25 Mg Tabs (Valsartan-hydrochlorothiazide) .[name]... 1 by mouth once daily for blood pressure    Metoprolol Tartrate 50 Mg Tabs (Metoprolol tartrate) .[name]... Take 1 tablet by mouth twice a day  Orders: [place]- Est  Level 4 [id number])  Problem # 3:  OSTEOARTHRITIS, MULTI SITES (ICD-715.98) Assessment: Deteriorated changed pain medications slightly.  advised return trip to sports medicine if this helped.   The following medications were removed from the medication list:    Diclofenac Sodium 75 Mg Tbec (Diclofenac sodium) .[name]... 1 by mouth two times a day for pain Her updated medication list for this problem includes:    Aspir-81 81 Mg Tbec (Aspirin) .[name]... Take 1 tablet by mouth every morning    Tramadol Hcl 50 Mg Tabs (Tramadol hcl) .[name]... 1 by mouth three times a day as needed pain.  works best when taken with tylenol.  Orders: [place]- Est  Level 4 (99214)  Problem # 4:  ANXIETY, SITUATIONAL (ICD-308.3) Assessment: New for dental visit prescribed xanax as needed.  Orders: [facility]- Est  Level 4 [id number])  Complete Medication List: 1)  Aspir-81 81 Mg Tbec (Aspirin) .... Take 1 tablet  by mouth every morning 2)  Diovan Hct 320-25 Mg Tabs (Valsartan-hydrochlorothiazide) .[name].. 1 by mouth once daily for blood pressure 3)  Metformin Hcl 500 Mg Tabs (Metformin hcl) .[name].. 1 po two times a day for diabetes 4)  Metoprolol Tartrate 50 Mg Tabs (Metoprolol tartrate) .... Take 1 tablet by mouth twice a day 5)  Prilosec 20 Mg Cpdr (Omeprazole) .... Take 1 capsule by mouth once a day 6)  Tramadol Hcl 50 Mg Tabs (Tramadol hcl) .[name].. 1 by mouth three times a day as needed pain.  works best when taken with tylenol. 7)  Alprazolam 0.5 Mg Tabs (Alprazolam) .[name].. 1-2 by mouth as needed severe anxiety/dental visit  Patient Instructions: 1)  Please follow up in 3 months or of course sooner if needed. 2)  Your A1C was 6.1 today!  Excellent! 3)  Your blood pressure looks excellent too! Prescriptions: ALPRAZOLAM 0.5 MG TABS (ALPRAZOLAM) 1-2 by mouth as needed severe anxiety/dental visit  #20 x 0  Entered and Authorized by:   Patria Mane  MD   Signed by:   Patria Mane  MD on 04/06/2009   Method used:   Print then Give to Patient   [id number] TRAMADOL HCL 50 MG TABS (TRAMADOL HCL) 1 by mouth three times a day as needed pain.  Works best when taken with tylenol.  #60 x 1   Entered and Authorized by:   Patria Mane  MD   Signed by:   Patria Mane  MD on 04/06/2009   Method used:   Electronically to        [facility]. [id number]* (retail)       [address]       [place], [place]  [address]       Ph: [phone] or [phone]       Fax: [phone]   RxID:   [id number]   Prevention & Chronic Care Immunizations   Influenza vaccine: Fluvax Non-MCR  (06/05/2008)   Influenza vaccine due: 06/05/2009    Tetanus booster: 02/08/2002: Done.   Tetanus booster due: 02/09/2012    Pneumococcal vaccine: Not documented    H. zoster vaccine: Not documented  Colorectal Screening   Hemoccult: Done.  (04/11/2002)   Hemoccult due: Not Indicated     Colonoscopy: Done.  (12/10/2002)   Colonoscopy due: 12/2012  Other Screening   Pap smear: Done.  (08/11/2006)   Pap smear due: 08/2009    Mammogram: Normal  (07/17/2008)   Mammogram due: 07/2010    DXA bone density scan: Not documented   Smoking status: never  (12/07/2008)  Diabetes Mellitus   HgbA1C: 6.1  (04/06/2009)   Hemoglobin A1C due: 01/31/2009    Eye exam: normal  (11/14/2008)   Eye exam due: 11/14/2009    Foot exam: yes  (10/31/2008)   High risk foot: Not documented   Foot care education: Not documented   Foot exam due: 10/31/2009    Urine microalbumin/creatinine ratio: Not documented   Urine microalbumin action/deferral: Not indicated  Lipids   Total Cholesterol: 187  (10/31/2008)   LDL: 113  (10/31/2008)   LDL Direct: 107  (10/21/2007)   HDL: 63  (10/31/2008)   Triglycerides: 53  (10/31/2008)    SGOT (AST): 15  (10/31/2008)   SGPT (ALT): 13  (10/31/2008)   Alkaline phosphatase: 71  (10/31/2008)   Total bilirubin: 0.6  (10/31/2008)  Hypertension   Last Blood Pressure: 129 / 67  (04/06/2009)   Serum creatinine: 1.02  (10/31/2008)   Serum potassium 4.2  (10/31/2008)  Self-Management Support :    Diabetes self-management support: Not documented    Hypertension self-management support: Not documented    Lipid self-management support: Not documented    Laboratory Results   Blood Tests   Date/Time Received: April 06, 2009 10:25 AM  Date/Time Reported: April 06, 2009 10:45 AM   HGBA1C: 6.1%   (Normal Range: Non-Diabetic - 3-6%   Control Diabetic - 6-8%)  Comments: ...........test performed by...........Marland KitchenHedy Camara, CMA     Appended Document: f/u,df     Allergies: 1)  Lisinopril (Lisinopril)   Complete Medication List: 1)  Aspir-81 81 Mg Tbec (Aspirin) .... Take 1 tablet by mouth every morning 2)  Diovan Hct 320-25 Mg Tabs (Valsartan-hydrochlorothiazide) .[name].. 1 by mouth once daily for blood pressure 3)  Metformin Hcl 500 Mg  Tabs (Metformin hcl) .[name].. 1 po two times a day for diabetes  4)  Metoprolol Tartrate 50 Mg Tabs (Metoprolol tartrate) .... Take 1 tablet by mouth twice a day 5)  Prilosec 20 Mg Cpdr (Omeprazole) .... Take 1 capsule by mouth once a day 6)  Tramadol Hcl 50 Mg Tabs (Tramadol hcl) .[name].. 1 by mouth three times a day as needed pain.  works best when taken with tylenol. 7)  Alprazolam 0.5 Mg Tabs (Alprazolam) .[name].. 1-2 by mouth as needed severe anxiety/dental visit   Prevention & Chronic Care Immunizations   Influenza vaccine: Fluvax Non-MCR  (06/05/2008)   Influenza vaccine deferral: Not available  (04/06/2009)   Influenza vaccine due: 06/05/2009    Tetanus booster: 02/08/2002: Done.   Tetanus booster due: 02/09/2012    Pneumococcal vaccine: Not documented    H. zoster vaccine: Not documented  Colorectal Screening   Hemoccult: Done.  (04/11/2002)   Hemoccult due: Not Indicated    Colonoscopy: Done.  (12/10/2002)   Colonoscopy due: 12/2012  Other Screening   Pap smear: Done.  (08/11/2006)   Pap smear due: 08/2009    Mammogram: Normal  (07/17/2008)   Mammogram due: 07/2010    DXA bone density scan: Not documented   Smoking status: never  (12/07/2008)  Diabetes Mellitus   HgbA1C: 6.1  (04/06/2009)   Hemoglobin A1C due: 01/31/2009    Eye exam: normal  (11/14/2008)   Eye exam due: 11/14/2009    Foot exam: yes  (10/31/2008)   High risk foot: Not documented   Foot care education: Not documented   Foot exam due: 10/31/2009    Urine microalbumin/creatinine ratio: Not documented   Urine microalbumin action/deferral: Not indicated    Diabetes flowsheet reviewed?: Yes   Progress toward A1C goal: At goal  Lipids   Total Cholesterol: 187  (10/31/2008)   LDL: 113  (10/31/2008)   LDL Direct: 107  (10/21/2007)   HDL: 63  (10/31/2008)   Triglycerides: 53  (10/31/2008)    SGOT (AST): 15  (10/31/2008)   SGPT (ALT): 13  (10/31/2008)   Alkaline phosphatase: 71  (10/31/2008)    Total bilirubin: 0.6  (10/31/2008)    Lipid flowsheet reviewed?: Yes   Progress toward LDL goal: Unchanged  Hypertension   Last Blood Pressure: 129 / 67  (04/06/2009)   Serum creatinine: 1.02  (10/31/2008)   Serum potassium 4.2  (10/31/2008)    Hypertension flowsheet reviewed?: Yes   Progress toward BP goal: At goal  Self-Management Support :   Personal Goals (by the next clinic visit) :     Personal A1C goal: 7  (04/06/2009)     Personal blood pressure goal: 130/80  (04/06/2009)     Personal LDL goal: 100  (04/06/2009)    Patient will work on the following items until the next clinic visit to reach self-care goals:     Medications and monitoring: take my medicines every day, bring all of my medications to every visit, examine my feet every day  (04/06/2009)     Activity: take a 30 minute walk every day  (04/06/2009)    Diabetes self-management support: Written self-care plan  (04/06/2009)   Diabetes care plan printed    Hypertension self-management support: Written self-care plan  (04/06/2009)   Hypertension self-care plan printed.    Lipid self-management support: Not documented     Lipid self-management support not done because: Not indicated  (04/06/2009)

## 2010-09-10 NOTE — Progress Notes (Signed)
Summary: refill  Phone Note Refill Request Call back at [phone] Message from:  Patient  Refills Requested: Medication #1:  METFORMIN HCL 500 MG TABS 1 po two times a day for diabetes has appt next Wed AM and needs enough to last until then  Initial call taken by: Audie Clear,  August 02, 2009 8:48 AM  Follow-up for Phone Call        told her it was refilled yesterday Follow-up by: Elige Radon RN,  August 02, 2009 8:50 AM     Appended Document: refill it was not at the pharm and she states they have not rec'd it. pls call pharmacy to verify.[name]  appt Wed.

## 2010-09-10 NOTE — Miscellaneous (Signed)
Summary: triamcinolone refill  Clinical Lists Changes  Medications: Added new medication of TRIAMCINOLONE ACETONIDE 0.1 % CREA (TRIAMCINOLONE ACETONIDE) apply to affected area three times a day as needed. Disp 60g tube - Signed Rx of TRIAMCINOLONE ACETONIDE 0.1 % CREA (TRIAMCINOLONE ACETONIDE) apply to affected area three times a day as needed. Disp 60g tube;  #60 x 3;  Signed;  Entered by: Patria Mane  MD;  Authorized by: Patria Mane  MD;  Method used: Electronically to [facility]. [id number]*, [address], [place]  [address], [place], [place]  [address], Ph: [phone] or [phone], Fax: [phone]    Prescriptions: TRIAMCINOLONE ACETONIDE 0.1 % CREA (TRIAMCINOLONE ACETONIDE) apply to affected area three times a day as needed. Disp 60g tube  #60 x 3   Entered and Authorized by:   Patria Mane  MD   Signed by:   Patria Mane  MD on 05/01/2009   Method used:   Electronically to        [facility]. [id number]* (retail)       [address]       [place],   [address]       Ph: [phone] or [phone]       Fax: [phone]   RxID:   [id number]

## 2010-09-10 NOTE — Assessment & Plan Note (Signed)
Summary: back pain,df   Vital Signs:  Patient profile:   65 year old female Weight:      301.2 pounds BMI:     44.64 Temp:     98.4 degrees F oral Pulse rate:   73 / minute Pulse rhythm:   regular BP sitting:   152 / 77  Vitals Entered By: Audelia Hives CMA (October 18, 2009 8:34 AM) CC: back pain, anemia, renal insufficiency, elevated esr Pain Assessment Patient in pain? yes     Location: lower back Intensity: 8 Type: burning Onset of pain  Constant Comments back pain on right side meds not helping, worse at night when lying down. it hurts when she coughs or takes a deep breath   Primary Care Provider:  Patria Mane  MD  CC:  back pain, anemia, renal insufficiency, and elevated esr.  History of Present Illness: 1.  right thoracic back pain been going on for one year.   pain is stabbing, and now sometimes burning. lasts 2-3 min at time on a bad day, it bothers her all day  intermittent  worse with deep breath, cough, or turn over at night.  certain movements make it worse tried muscle relaxers, tylenol,diclofenac.  not much relief better if she lays on the right side on a pillow.  massaging it makes it better.   also has hx of "bulging discs" and lumbar back pain, but this is different the pain actually does not bother her that much; she just wants to make sure there is not something serious wrong  Here second concern today is results of the labs she recently had drawn.  We went through her lab results from 2/22 and discussed the following issues:  2.  anemia--hgb 9.9 in december.  iron studies showed iron deficiency.  started on iron.  now hgb is 10.3.  no significant symptoms from this.  no longer menstruates.  no BRBPR or melena.  no hx of ulcers.  Dr Otilio Carpen has arranged a colonoscopy for her.    3.  renal insufficiency--Cr since 2008 had hovered around 1.0.  However, in December 2010, Cr found to be 1.4.  repeat bmet on 2/22 showed cr of 1.45.  had taken daily  diclofenac for a while--hard to get an exact time frame for this.  on diovan as well, but has been on this for a while (since at least 2009).  has hypertension and diabetes.  it looks like the diabetes has been well controlled.  It looks like bp in the office has often been on the higher side (but some appts had forgotten to take meds prior).    4.  elevated ESR--ESR checked and found to be 117.  also with pain in multiple joints; so question of PMR was raised and pt started on prednisone; which has helped, esp with her shoulder pain.  currently taking 10mg  daily     (931) [phone] cell (mssg ok)  Allergies: 1)  Lisinopril (Lisinopril)  Past History:  Past Medical History: Last updated: 10/31/2008 H/O Redux use,  hyperthryroidism resolved 06/04-PTU d/c`d, -- goiter Recurrent vulvovaginitis HTN Obesity Paresthesias Osteoarthrtis/ DJD GERD Back pain DM2.  stress echo normal 02/08  Review of Systems General:  Denies fatigue, fever, and loss of appetite. GU:  Complains of nocturia. MS:  Complains of low back pain and mid back pain; denies muscle weakness. Heme:  Denies abnormal bruising, fevers, and pallor.  Physical Exam  General:  obese, in no acute distress; alert,appropriate and cooperative throughout  examination Msk:  t-spine--mild ttp over right posterior rib cage.  no bruising or deformity.    shoulders--full rom of shoulders, but with pain, esp with over head movement.  5/5 strength in the major muscle groups, but pain with resistance.  no joint swelling and no joint warmth.   Additional Exam:  vital signs reviewed    Impression & Recommendations:  Problem # 1:  RENAL FAILURE (ICD-586) Assessment Unchanged perhaps this is chronic from hypertension compounded by nsaid use and an arb, but think it warrants a work-up since her cr is still high.  CrCl is 44.1 (cockcroft-gault).  will get labs and studies as below.  follow up in 3 weeks.  stop diclofenac.  left her on  the diovan for now.  avoid adding any nephro toxins Orders: Urinalysis-[facility] (00000) Sed Rate (ESR)-[facility] [phone]) ANA-[facility] [id number]) Miscellaneous Lab Charge-[facility] [id number]) SPEP- [place] [phone]) 24hr. Urine CC- [place] [phone]) 24hr. Urine TP- [place] [phone]) Ultrasound (Ultrasound) [place]- Est  Level 4 [id number])  Problem # 2:  UNSPECIFIED IRON DEFICIENCY ANEMIA (ICD-280.9) Assessment: Unchanged  continue iron.  has colonoscopy consult appt set up for 3/16.  recheck ESR  Her updated medication list for this problem includes:    Ferrous Sulfate 325 (65 Fe) Mg Tabs (Ferrous sulfate) .[name]... 1 by mouth once daily for anemia.  Orders: [facility]- Est  Level 4 ([id number])  Problem # 3:  BACK PAIN, THORACIC REGION (ICD-724.1) Assessment: Unchanged  she really does not want anything for pain; just wants reassurance that nothing serious is wrong.  try heat and ice and work up other problems as above.   check t-spine and l-spine Her updated medication list for this problem includes:    Aspir-81 81 Mg Tbec (Aspirin) .[name]... Take 1 tablet by mouth every morning  Orders: Radiology other (Radiology Other) [facility]- Est  Level 4 [id number])  Problem # 4:  other commments big picture is somewhat concerning in pt over 50 with iron deficincy anemia in non-menstruating woman, elevated esr and renal insufficiency.  can't think of anything that links this all together, but want to rule out malignancy or other ominous causes.  Complete Medication List: 1)  Aspir-81 81 Mg Tbec (Aspirin) .... Take 1 tablet by mouth every morning 2)  Diovan Hct 320-25 Mg Tabs (Valsartan-hydrochlorothiazide) .[name].. 1 by mouth once daily for blood pressure 3)  Metformin Hcl 500 Mg Tabs (Metformin hcl) .[name].. 1 po two times a day for diabetes 4)  Metoprolol Tartrate 50 Mg Tabs (Metoprolol tartrate) .... Take 1 tablet by mouth twice a day 5)  Prilosec 20 Mg Cpdr (Omeprazole) .... Take 1 capsule by mouth once a day 6)  Prednisone 10 Mg Tabs  (Prednisone) .[name].. 1 tab by mouth once daily for shoulder pain. 7)  Ferrous Sulfate 325 (65 Fe) Mg Tabs (Ferrous sulfate) .[name].. 1 by mouth once daily for anemia. 8)  Contour Blood Glucose System W/device Kit (Blood glucose monitoring suppl) .... For checking sugars once daily 9)  Bayer Contour Test Strp (Glucose blood) .... For checking sugars once daily  Patient Instructions: 1)  It was nice to see you today. 2)  For your back pain, try heat and ice.   3)  STOP the diclofenac. 4)  If you need something else for pain, call me 5)  Go get your kidney ultrasound and do your 24 hour urine test.  Horris Latino and Butch Penny will explain it to you. 6)  I will be talking with you as lab results come in. 7)  Please schedule a follow-up appointment within the next 3 weeks with Dr. [name], or with me if she is booked up.  Laboratory Results   Urine Tests  Date/Time Received: October 18, 2009 9:54 AM  Date/Time Reported: October 18, 2009 12:13 PM   Routine Urinalysis   Color: yellow Appearance: Clear Glucose: negative   (Normal Range: Negative) Bilirubin: negative   (Normal Range: Negative) Ketone: negative   (Normal Range: Negative) Spec. Gravity: 1.010   (Normal Range: 1.003-1.035) Blood: negative   (Normal Range: Negative) pH: 5.5   (Normal Range: 5.0-8.0) Protein: negative   (Normal Range: Negative) Urobilinogen: 0.2   (Normal Range: 0-1) Nitrite: negative   (Normal Range: Negative) Leukocyte Esterace: negative   (Normal Range: Negative)    Comments: ...............test performed by......Marland KitchenBonnie A. [name], MLS (ASCP)cm   Blood Tests   Date/Time Received: October 18, 2009 9:54 AM  Date/Time Reported: October 18, 2009 12:12 PM   SED rate: 80 mm/hr  Comments: ...............test performed by......Marland KitchenBonnie A. [name], MLS (ASCP)cm     Prevention & Chronic Care Immunizations   Influenza vaccine: Fluvax 3+  (08/08/2009)   Influenza vaccine deferral: Not available  (04/06/2009)   Influenza  vaccine due: 06/05/2009    Tetanus booster: 02/08/2002: Done.   Tetanus booster due: 02/09/2012    Pneumococcal vaccine: Not documented    H. zoster vaccine: Not documented  Colorectal Screening   Hemoccult: Done.  (04/11/2002)   Hemoccult due: Not Indicated    Colonoscopy: Done.  (12/10/2002)   Colonoscopy due: 12/2012  Other Screening   Pap smear: Done.  (08/11/2006)   Pap smear due: 08/2009    Mammogram: Normal  (10/06/2009)   Mammogram action/deferral: Not indicated  (10/02/2009)   Mammogram due: 10/2010    DXA bone density scan: Not documented   DXA bone density action/deferral: Deferred  (10/02/2009)   Smoking status: never  (10/02/2009)  Diabetes Mellitus   HgbA1C: 6.3  (08/08/2009)   Hemoglobin A1C due: 01/31/2009    Eye exam: normal  (11/14/2008)   Eye exam due: 11/14/2009    Foot exam: yes  (08/08/2009)   High risk foot: Not documented   Foot care education: Not documented   Foot exam due: 10/31/2009    Urine microalbumin/creatinine ratio: Not documented   Urine microalbumin action/deferral: Not indicated    Diabetes flowsheet reviewed?: Yes   Progress toward A1C goal: At goal  Lipids   Total Cholesterol: 187  (10/31/2008)   LDL: 113  (10/31/2008)   LDL Direct: 107  (10/21/2007)   HDL: 63  (10/31/2008)   Triglycerides: 53  (10/31/2008)    SGOT (AST): 17  (10/02/2009)   SGPT (ALT): 11  (10/02/2009)   Alkaline phosphatase: 70  (10/02/2009)   Total bilirubin: 0.3  (10/02/2009)    Lipid flowsheet reviewed?: Yes   Progress toward LDL goal: Unchanged    Stage of readiness to change (lipid management): Contemplation  Hypertension   Last Blood Pressure: 152 / 77  (10/18/2009)   Serum creatinine: 1.45  (10/02/2009)   Serum potassium 4.0  (10/02/2009)    Hypertension flowsheet reviewed?: Yes   Progress toward BP goal: Unchanged    Stage of readiness to change (hypertension management): Contemplation  Self-Management Support :   Personal  Goals (by the next clinic visit) :     Personal A1C goal: 8  (08/08/2009)     Personal blood pressure goal: 130/80  (04/06/2009)     Personal LDL goal: 100  (04/06/2009)    Diabetes self-management support:  Written self-care plan  (10/02/2009)    Diabetes self-management support not done because: Good outcomes  (08/08/2009)    Hypertension self-management support: BP self-monitoring log, Written self-care plan  (10/18/2009)   Hypertension self-care plan printed.    Lipid self-management support: Lipid monitoring log, Written self-care plan  (10/18/2009)   Lipid self-care plan printed.    Lipid self-management support not done because: Not indicated  (08/08/2009)

## 2010-09-10 NOTE — Consult Note (Signed)
Summary: Ophthalmology  Ophthalmology   Imported By: Audie Clear 11/21/2008 15:50:22  _____________________________________________________________________  External Attachment:    Type:   Image     Comment:   External Document  Appended Document: Ophthalmology    Clinical Lists Changes  Observations: Added new observation of POTASSIUMDUE: 10/31/2009 (11/22/2008 9:41) Added new observation of CREATNXTDUE: 10/31/2009 (11/22/2008 9:41) Added new observation of HGBA1CNXTDUE: 01/31/2009 (11/22/2008 9:41) Added new observation of DB FT EX DUE: 10/31/2009 (11/22/2008 9:41) Added new observation of DMEYEEXAMNXT: 11/14/2009 (11/22/2008 9:41) Added new observation of HDLNXTDUE: [id number] (11/22/2008 9:41) Added new observation of DBT EY CK DT: 11/14/2008 (11/14/2008 9:42) Added new observation of DIAB EYE EX: normal (11/14/2008 9:42)      Last Diabetes Foot Check:  yes (10/31/2008 8:28:02 AM) Diabetes Foot Check Next Due:  1 yr Last Diabetes Eye Exam:  normal (05/19/2007 3:25:51 PM) Diabetic Eye Exam Date:  11/14/2008 Diabetes Eye Exam Result:  normal Diabetes Eye Exam Due:  1 yr Last HGBA1C:  6.1 (10/31/2008 8:28:02 AM) HGBA1C Next Due:  3 mo Last Creatinine:  1.02 (10/31/2008 8:13:00 PM) Creatinine Next Due: 1 yr Last Potassium:  4.2 (10/31/2008 8:13:00 PM) Potassium Next Due:  1 yr

## 2010-09-10 NOTE — Progress Notes (Signed)
Summary: test results  Phone Note Call from Patient Call back at [phone]   Caller: Patient Summary of Call: pt is calling about lab test results Initial call taken by: Audie Clear,  October 29, 2009 9:07 AM  Follow-up for Phone Call        md talked to her 3/15. awaiting other results. told her I will send this to her md & will call her or md will call her Follow-up by: Elige Radon RN,  October 29, 2009 9:13 AM  Additional Follow-up for Phone Call Additional follow up Details #1::        called patient to discuss results and that at this point in time we would like for her to have a visit with her.  patient didn't answer so left message.    please let her know when she calls back that we are going to refer her to a kidney doctor that we think should be able to put all of this together.  they will let us know if we should have her visit any other specialists.   Additional Follow-up by: Patria Mane  MD,  October 29, 2009 12:22 PM    Additional Follow-up for Phone Call Additional follow up Details #2::    told her she was going to be referred to a kidney md. she was ok with that. does not want it on a wednesday & has to be after the 24th of this month.  Follow-up by: Elige Radon RN,  October 29, 2009 4:43 PM  Additional Follow-up for Phone Call Additional follow up Details #3:: Details for Additional Follow-up Action Taken: noted. will make referral. Additional Follow-up by: Marcell Barlow RN,  October 30, 2009 10:07 AM

## 2010-09-10 NOTE — Procedures (Signed)
Summary: Colonoscopy  Patient: Sidonia Knake Note: All result statuses are Final unless otherwise noted.  Tests: (1) Colonoscopy (COL)   COL Colonoscopy           [facility]     [place], [place]  [address]           COLONOSCOPY PROCEDURE REPORT           PATIENT:  Erienne, Alexy  MR#:  [id number]     BIRTHDATE:  01-14-1946, 63 yrs. old  GENDER:  female           ENDOSCOPIST:  Sandy Salaam. [name], MD     Referred by:  Madison Hickman, M.D.           PROCEDURE DATE:  11/06/2009     PROCEDURE:  Colonoscopy with snare polypectomy     ASA CLASS:  Class II     INDICATIONS:  1) heme positive stool  2) Iron Deficiency Anemia Pt     had been taking NSAIDS. Recent hemeoccult was negative.           MEDICATIONS:   Fentanyl 75 mcg IV, Versed 8 mg IV           DESCRIPTION OF PROCEDURE:   After the risks benefits and     alternatives of the procedure were thoroughly explained, informed     consent was obtained.  Digital rectal exam was performed and     revealed no abnormalities.   The [id number] endoscope was     introduced through the anus and advanced to the cecum, which was     identified by both the appendix and ileocecal valve, without     limitations.  The quality of the prep was excellent, using     MoviPrep.  The instrument was then slowly withdrawn as the colon     was fully examined.     <<PROCEDUREIMAGES>>           FINDINGS:  A sessile polyp was found in the sigmoid colon. It was     2 mm in size. It was found 30 cm from the point of entry. Polyp     was snared without cautery. Retrieval was successful (see     image011). snare polyp  Mild diverticulosis was found in the     sigmoid colon (see image013).  This was otherwise a normal     examination of the colon (see image002, image004, image005,     image006, image008, image010, image012, and image014).     Retroflexed views in the rectum revealed no abnormalities.    The    time to cecum =  1) withdraw time 07:20  minutes. The scope was     then withdrawn (time =  min) from the patient and the procedure     completed.           COMPLICATIONS:  None           ENDOSCOPIC IMPRESSION:     1) 2 mm sessile polyp in the sigmoid colon     2) Mild diverticulosis in the sigmoid colon     3) Otherwise normal examination           Fe deficiency anemia is probably related to NSAID use.  Currently     heme negative.  No further GI w/u.     RECOMMENDATIONS:     1) If the polyp(s)  removed today are proven to be adenomatous     (pre-cancerous) polyps, you will need a repeat colonoscopy in 5     years. Otherwise you should continue to follow colorectal cancer     screening guidelines for "routine risk" patients with colonoscopy     in 10 years.           REPEAT EXAM:   You will receive a letter from Dr. [name] in 1-2     weeks, after reviewing the final pathology, with followup     recommendations.           ______________________________     Sandy Salaam [name], MD           CC:           n.     eSIGNED:   Sandy Salaam. Mauri Tolen at 11/06/2009 10:17 AM           [place] Caster, [id number]  Note: An exclamation mark (!) indicates a result that was not dispersed into the flowsheet. Document Creation Date: 11/06/2009 10:18 AM _______________________________________________________________________  (1) Order result status: Final Collection or observation date-time: 11/06/2009 10:10 Requested date-time:  Receipt date-time:  Reported date-time:  Referring Physician:   Ordering Physician: Erskine Emery [phone]) Specimen Source:  Source: Tawanna Cooler Order Number: [id number] Lab site:   Appended Document: Colonoscopy Recall is in [place] for 10/2019. REV Scheduled for 12-10-09 at 9:30am. Pt. instructed to call back as needed.

## 2010-09-10 NOTE — Progress Notes (Signed)
Summary: phn msg  Phone Note Call from Patient Call back at Work Phone [phone]   Caller: Patient Summary of Call: Pt calling Dr. [name] back.  Can be reached on her cell number. Initial call taken by: Raymond Gurney,  Dec 12, 2009 3:31 PM  Follow-up for Phone Call        let  her know what previous phone note said.  she is going to think about if she wants to just wait and monitor her symptoms and watch her diet or if she wants to have an appt with the surgeons to discuss her options.   Follow-up by: Patria Mane  MD,  Dec 13, 2009 9:07 AM

## 2010-09-10 NOTE — Letter (Signed)
Summary: Lipid Letter  [facility]  [address]   [place], [place] [address]   Phone: [phone]  Fax: [phone]    11/02/2008  Mahdia Kor [address] [place], [place]  [address]  Dear Ms. Buchholz:  We have carefully reviewed your last lipid profile from 10/31/2008 and the results are noted below with a summary of recommendations for lipid management.    Cholesterol:       187     Goal: < 200   HDL "good" Cholesterol:   63     Goal: > 40 (excellent!)   LDL "bad" Cholesterol:   113     Goal: < 70   Triglycerides:       53     Goal: < 150    Overall these numbers are good but with your diabetes could be a little better.  I think it would be helpful to start you on a medicine called a statin to help get your cholesterol down a little bit further.  I think we will only need a low dose and could choose from a medicine that is on the $4 list.  Please let me know what you think of this.  Your other blood tests checking your kidney, liver and thyroid function were all normal.    TLC Diet (Therapeutic Lifestyle Change): Saturated Fats & Transfatty acids should be kept < 7% of total calories ***Reduce Saturated Fats Polyunstaurated Fat can be up to 10% of total calories Monounsaturated Fat Fat can be up to 20% of total calories Total Fat should be no greater than 25-35% of total calories Carbohydrates should be 50-60% of total calories Protein should be approximately 15% of total calories Fiber should be at least 20-30 grams a day ***Increased fiber may help lower LDL Total Cholesterol should be < 200mg /day Consider adding plant stanol/sterols to diet (example: Benacol spread) ***A higher intake of unsaturated fat may reduce Triglycerides and Increase HDL    Adjunctive Measures (may lower LIPIDS and reduce risk of Heart Attack) include: Aerobic Exercise (20-30 minutes 3-4 times a week) Limit Alcohol Consumption Weight Reduction Aspirin 75-81 mg a day  by mouth (if not allergic or contraindicated) Dietary Fiber 20-30 grams a day by mouth     Current Medications: 1)    Aspir-81 81 Mg Tbec (Aspirin) .... Take 1 tablet by mouth every morning 2)    Diovan Hct 320-25 Mg Tabs (Valsartan-hydrochlorothiazide) .[name].. 1 by mouth once daily for blood pressure 3)    Metformin Hcl 500 Mg Tabs (Metformin hcl) .[name].. 1 po two times a day.  last refill before visit with primary doctor. 4)    Metoprolol Tartrate 50 Mg Tabs (Metoprolol tartrate) .... Take 1 tablet by mouth twice a day 5)    Prilosec 20 Mg Cpdr (Omeprazole) .... Take 1 capsule by mouth once a day 6)    Diclofenac Sodium 75 Mg  Tbec (Diclofenac sodium) .[name].. 1 by mouth two times a day for pain 7)    Cyclobenzaprine Hcl 10 Mg Tabs (Cyclobenzaprine hcl) .... 1/2 - 1 tablets 1-2 times daily as needed for muscle spasm  If you have any questions, please call. We appreciate being able to work with you.   Sincerely,    [facility] Patria Mane  MD  Appended Document: Lipid Letter Mailed

## 2010-09-10 NOTE — Progress Notes (Signed)
  Phone Note Outgoing Call   Call placed by: Carin Hock MD,  October 23, 2009 12:35 PM Summary of Call: Called pt to give results of t-spine and renal ultrasound.  T-spine showed DISH (diffuse idiopathic skeletal hyperostosis).  Renal ultrasound was normal.  ANA was positive.  Explained results.  Still awaiting parts of the SPEP/UPEP to come back.  Will call pt when these are resulted.  Has follow up with Dr [name] in early April.    She may need referral to renal.  Will discuss with Dr. [name]. Initial call taken by: Carin Hock MD,  October 23, 2009 12:37 PM

## 2010-09-10 NOTE — Assessment & Plan Note (Signed)
Summary: R shoulder pain wp   Vital Signs:  Patient Profile:   65 Years Old Female Height:     69 inches Weight:      307.7 pounds BMI:     45.60 Temp:     98.3 degrees F oral Pulse rate:   74 / minute BP sitting:   136 / 78 Cuff size:   regular  Pt. in pain?   yes    Location:   back    Intensity:   8  Vitals Entered By: Elige Radon RN (February 15, 2008 8:24 AM)                  Chief Complaint:  lower back pain.    Current Allergies: LISINOPRIL (LISINOPRIL)        Complete Medication List: 1)  Aspir-81 81 Mg Tbec (Aspirin) .... Take 1 tablet by mouth every morning 2)  Diovan 160 Mg Tabs (Valsartan) .[name].. 1 by mouth daily 3)  Ibuprofen 800 Mg Tabs (Ibuprofen) .... Take 1 tablet by mouth three times a day 4)  Metformin Hcl 500 Mg Tabs (Metformin hcl) .... Two times a day 5)  Metoprolol Tartrate 50 Mg Tabs (Metoprolol tartrate) .... Take 1 tablet by mouth twice a day 6)  Prilosec 20 Mg Cpdr (Omeprazole) .... Take 1 capsule by mouth once a day 7)  Zithromax Z-pak 250 Mg Tabs (Azithromycin) .... As directed 8)  Hydromet 5-1.5 Mg/3ml Syrp (Hydrocodone-homatropine) .... One teasp three times a day as needed., 120 cc 9)  Norvasc 10 Mg Tabs (Amlodipine besylate) .[name].. 1 by mouth daily 10)  Diclofenac Sodium 75 Mg Tbec (Diclofenac sodium) .[name].. 1 by mouth bid 11)  Flexeril 10 Mg Tabs (Cyclobenzaprine hcl) .[name].. 1 by mouth at bedtime prn   Patient Instructions: 1)  Back strain - take Flexeril at night - this is a muscle relaxant that will make you sleepy.  Take Diclofenac twice a day for 2 weeks.  Take with food.  Do not take Ibuprofen, Aleve, Advil, Motrin while taking this.  You can take Tylenol.  Use a heating pad on area 20 minutse 3x/day.  Or stand in a warm shower and let teh water run down your back.    Prescriptions: FLEXERIL 10 MG  TABS (CYCLOBENZAPRINE HCL) 1 by mouth at bedtime prn  #10 x 0   Entered and Authorized by:   Hortencia Pilar MD   Signed by:   Hortencia Pilar MD on 02/15/2008   Method used:   Print then Give to Patient   RxID:   [id number] DICLOFENAC SODIUM 75 MG  TBEC (DICLOFENAC SODIUM) 1 by mouth bid  #30 x 0   Entered and Authorized by:   Hortencia Pilar MD   Signed by:   Hortencia Pilar MD on 02/15/2008   Method used:   Print then Give to Patient   RxID:   [id number]  ]  Appended Document: R shoulder pain wp      Chief Complaint:  Back Pain.  History of Present Illness: 65 yo F with:  1. back pain - started 5 days ago.  was reaching for something in church with her right hand.  has happneed before but got better quickly.  started out as a catch but now a nagging all the time.  hard to get into bed, wakes her up at night.  using tyelnol arthritis which doesn't help much.  hard for her to put heating pad on her back.  no fevers.  No urinary, fecal incontinence.  Had numbness for a bit but it went away - she attributes this is standing too much..    Current Allergies: LISINOPRIL (LISINOPRIL)      Physical Exam  General:     Well-developed,well-nourished,in no acute distress; alert,appropriate and cooperative throughout examination Msk:     good ROM at back.  pain over L. dorsi on right.  good ROM R arm.  No pain over spinous processes, paraspinous muscles.  Normal patellar reflex.  good sensation bilateral feet.    Impression & Recommendations:  Problem # 1:  BACK STRAIN ([id number]) Assessment: New see pt instrauctions. Orders: [facility]- Est Level  3 [id number])   Complete Medication List: 1)  Aspir-81 81 Mg Tbec (Aspirin) .... Take 1 tablet by mouth every morning 2)  Diovan 160 Mg Tabs (Valsartan) .[name].. 1 by mouth daily 3)  Ibuprofen 800 Mg Tabs (Ibuprofen) .... Take 1 tablet by mouth three times a day 4)  Metformin Hcl 500 Mg Tabs (Metformin hcl) .... Two times a day 5)  Metoprolol Tartrate 50 Mg Tabs (Metoprolol tartrate) .... Take 1 tablet by mouth twice a day 6)  Prilosec 20 Mg Cpdr (Omeprazole) ....  Take 1 capsule by mouth once a day 7)  Zithromax Z-pak 250 Mg Tabs (Azithromycin) .... As directed 8)  Hydromet 5-1.5 Mg/78ml Syrp (Hydrocodone-homatropine) .... One teasp three times a day as needed., 120 cc 9)  Norvasc 10 Mg Tabs (Amlodipine besylate) .[name].. 1 by mouth daily 10)  Diclofenac Sodium 75 Mg Tbec (Diclofenac sodium) .[name].. 1 by mouth bid 11)  Flexeril 10 Mg Tabs (Cyclobenzaprine hcl) .[name].. 1 by mouth at bedtime prn    ]

## 2010-09-10 NOTE — Assessment & Plan Note (Signed)
Summary: f/u meds,df   Vital Signs:  Patient profile:   65 year old female Height:      69 inches Weight:      297.1 pounds BMI:     44.03 Temp:     99.3 degrees F oral Pulse rate:   96 / minute BP sitting:   156 / 77  (left arm) Cuff size:   large  Vitals Entered By: Levert Feinstein LPN (December 29, 624THL 8:37 AM) CC: f/u DM, HTN, shoulder pain, PICA Is Patient Diabetic? Yes Did you bring your meter with you today? No Pain Assessment Patient in pain? yes     Location: headache   Primary Care Provider:  Patria Mane  MD  CC:  f/u DM, HTN, shoulder pain, and PICA.  History of Present Illness: DM: overall doing well. taking meds as prescribed and without complaints.  not checking sugars at home.  denies any symptoms of having low sugars (shakiness, etc).  HTN: didn't take meds this AM but normally reports being very good about taking meds.  doesn't check at home.  denies meds causing dizziness, SOB, edema, chest pain  shoulder pain: persists.  still with decreased ROM over head.  meds previously tried didn't help and she doesn't at this point want a different med.  not sure if she wants to have shoulder injected or not again.;  aspercreme helps somewhat when she uses it.   PICA: has noticed for past months she has been craving starch.  unsure why. denies seeing any blood in stool, urine or any vaginal bleeding.  is up to date on colonoscopy.   Habits & Providers  Alcohol-Tobacco-Diet     Tobacco Status: never  Current Medications (verified): 1)  Aspir-81 81 Mg Tbec (Aspirin) .... Take 1 Tablet By Mouth Every Morning 2)  Diovan Hct 320-25 Mg Tabs (Valsartan-Hydrochlorothiazide) .[name].. 1 By Mouth Once Daily For Blood Pressure 3)  Metformin Hcl 500 Mg Tabs (Metformin Hcl) .[name].. 1 Po Two Times A Day For Diabetes 4)  Metoprolol Tartrate 50 Mg Tabs (Metoprolol Tartrate) .... Take 1 Tablet By Mouth Twice A Day 5)  Prilosec 20 Mg Cpdr (Omeprazole) .... Take 1 Capsule By Mouth Once A  Day  Allergies (verified): 1)  Lisinopril (Lisinopril)  Past History:  Past medical, surgical, family and social histories (including risk factors) reviewed for relevance to current acute and chronic problems.  Past Medical History: Reviewed history from 10/31/2008 and no changes required. H/O Redux use,  hyperthryroidism resolved 06/04-PTU d/c`d, -- goiter Recurrent vulvovaginitis HTN Obesity Paresthesias Osteoarthrtis/ DJD GERD Back pain DM2.  stress echo normal 02/08  Past Surgical History: Reviewed history from 10/31/2008 and no changes required. ABI=1.06 (normal) - 12/14/2001 carpel tunnel release - 11/10/2001 EGD nl 12/2002 - 01/18/2003 ECHO 1/08: mild, subtle inferior hypokinesis - 09/08/2006 Holter -symptomatic PVCs only 03/00 - 01/18/2003 Hysterectomy/BSO - Supracervical - 11/10/2001 L foot surgery - 11/10/2001 R knee arthroscopy 05/04 Thyroid uptake scan normal - 02/08/2002  Family History: Reviewed history from 10/21/2007 and no changes required. father - DM, HTN, CRF, COPD, motehr - DM, HTN, PUD, MI 24s,  sisters and brothers - DM, HTN Leukemia - sister died at 88 yo Sister died of malnutrition  **early FHx of CAD - diffuse family history ot MI in 34s.  Social History: Reviewed history from 10/21/2007 and no changes required. no tob/ETOH/drugs; widow, 3 grown children Anaysha Rhude, Barbette Or); 7 grandchildren and 5 great grandchildren.   On disablility 12/04 for OA  knees/spine.  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  Walks occasionally.  Review of Systems       per HPI otherwise negative  Physical Exam  General:  morbidly obese pleasant AAF, NAD Lungs:  Normal respiratory effort, chest expands symmetrically. Lungs are clear to auscultation, no crackles or wheezes. Heart:  Normal rate and regular rhythm. S1 and S2 normal without gallop, click, rub. 2/6 SEM at upper sternal borders bilaterally.  Extremities:  no edema. 2+ pulses  Diabetes Management Exam:    Foot Exam (with socks and/or shoes not present):       Sensory-Pinprick/Light touch:          Left medial foot (L-4): normal          Left dorsal foot (L-5): normal          Left lateral foot (S-1): normal          Right medial foot (L-4): normal          Right dorsal foot (L-5): normal          Right lateral foot (S-1): normal       Sensory-Monofilament:          Left foot: normal          Right foot: normal       Inspection:          Left foot: normal          Right foot: normal       Nails:          Left foot: normal          Right foot: normal   Impression & Recommendations:  Problem # 1:  DIABETES MELLITUS, TYPE II, CONTROLLED, MILD (ICD-250.00) Assessment Unchanged doing well.  no changes  Her updated medication list for this problem includes:    Aspir-81 81 Mg Tbec (Aspirin) .[name]... Take 1 tablet by mouth every morning    Diovan Hct 320-25 Mg Tabs (Valsartan-hydrochlorothiazide) .[name]... 1 by mouth once daily for blood pressure    Metformin Hcl 500 Mg Tabs (Metformin hcl) .[name]... 1 po two times a day for diabetes  Orders: A1C-[facility] [id number]) [place]- Est  Level 4 [id number])  Problem # 2:  HYPERTENSION, BENIGN ESSENTIAL (ICD-401.1) Assessment: Deteriorated encouraged her to check bp at home, be sure to take meds before visits.   Her updated medication list for this problem includes:    Diovan Hct 320-25 Mg Tabs (Valsartan-hydrochlorothiazide) .[name]... 1 by mouth once daily for blood pressure    Metoprolol Tartrate 50 Mg Tabs (Metoprolol tartrate) .[name]... Take 1 tablet by mouth twice a day  Orders: Comp Met-[facility] [id number]) [place]- Est  Level 4 [id number])  Problem # 3:  SHOULDER PAIN, BILATERAL (ICD-719.41) Assessment: Deteriorated ? if could be polymyalgia rheumatica.  also does have hip pain at times.  check ESR.     The following medications were removed from the medication list:    Tramadol Hcl 50 Mg Tabs (Tramadol  hcl) .[name]... 1 by mouth three times a day as needed pain.  works best when taken with tylenol.    Diclofenac Sodium 75 Mg Tbec (Diclofenac sodium) .[name]... 1 by mouth two times a day as needed pain Her updated medication list for this problem includes:    Aspir-81 81 Mg Tbec (Aspirin) .[name]... Take 1 tablet by mouth every morning  Orders: Sed Rate (ESR)-[facility] ([id number]) [place]- Est  Level 4 [id number])  Problem # 4:  PICA (ICD-307.52)  Assessment: New check CBC, iron studies.  ? source of blood if low.  consider hemacult cards.   Orders: CBC-[facility] [id number]) Iron -[facility] [id number]) Iron Binding Cap (TIBC)-[facility] ([id number]) Ferritin-[facility] ([id number]) [facility]- Est  Level 4 [id number])  Complete Medication List: 1)  Aspir-81 81 Mg Tbec (Aspirin) .... Take 1 tablet by mouth every morning 2)  Diovan Hct 320-25 Mg Tabs (Valsartan-hydrochlorothiazide) .[name].. 1 by mouth once daily for blood pressure 3)  Metformin Hcl 500 Mg Tabs (Metformin hcl) .[name].. 1 po two times a day for diabetes 4)  Metoprolol Tartrate 50 Mg Tabs (Metoprolol tartrate) .... Take 1 tablet by mouth twice a day 5)  Prilosec 20 Mg Cpdr (Omeprazole) .... Take 1 capsule by mouth once a day  Other Orders: Flu Vaccine 62yrs + [id number]) Admin 1st Vaccine [phone]) Admin 1st Vaccine [facility]) [phone])  Patient Instructions: 1)  Please follow up in about 3 months or of course sooner if needed. 2)  Let me know if you need refills on any medicines.  3)  I will let you know the results of your blood tests.  4)  It is fine to continue the aspercreme to the shoulders.  5)  It was nice to see you today!  Laboratory Results   Blood Tests   Date/Time Received: August 08, 2009 8:41 AM  Date/Time Reported: August 08, 2009 10:42 AM    HGBA1C: 6.3%   (Normal Range: Non-Diabetic - 3-6%   Control Diabetic - 6-8%) SED rate: 117 mm/hr  Comments: ...........test performed by...........Marland KitchenHedy Camara, CMA      Influenza Vaccine    Vaccine Type: Fluvax 3+    Site:  left deltoid    Mfr: GlaxoSmithKline    Dose: 0.5 ml    Route: IM    Given by: Levert Feinstein LPN    Exp. Date: 02/07/2010    Lot #: [id number]    VIS given: 03/04/07 version given August 08, 2009.  Flu Vaccine Consent Questions    Do you have a history of severe allergic reactions to this vaccine? no    Any prior history of allergic reactions to egg and/or gelatin? no    Do you have a sensitivity to the preservative Thimersol? no    Do you have a past history of Guillan-Barre Syndrome? no    Do you currently have an acute febrile illness? no    Have you ever had a severe reaction to latex? no    Vaccine information given and explained to patient? yes    Are you currently pregnant? no   Prevention & Chronic Care Immunizations   Influenza vaccine: Fluvax 3+  (08/08/2009)   Influenza vaccine deferral: Not available  (04/06/2009)   Influenza vaccine due: 06/05/2009    Tetanus booster: 02/08/2002: Done.   Tetanus booster due: 02/09/2012    Pneumococcal vaccine: Not documented    H. zoster vaccine: Not documented  Colorectal Screening   Hemoccult: Done.  (04/11/2002)   Hemoccult due: Not Indicated    Colonoscopy: Done.  (12/10/2002)   Colonoscopy due: 12/2012  Other Screening   Pap smear: Done.  (08/11/2006)   Pap smear due: 08/2009    Mammogram: Normal  (07/17/2008)   Mammogram due: 07/17/2009    DXA bone density scan: Not documented   Smoking status: never  (08/08/2009)  Diabetes Mellitus   HgbA1C: 6.3  (08/08/2009)   Hemoglobin A1C due: 01/31/2009    Eye exam: normal  (11/14/2008)   Eye exam due: 11/14/2009    Foot exam: yes  (  08/08/2009)   High risk foot: Not documented   Foot care education: Not documented   Foot exam due: 10/31/2009    Urine microalbumin/creatinine ratio: Not documented   Urine microalbumin action/deferral: Not indicated    Diabetes flowsheet reviewed?: Yes   Progress toward A1C goal: At goal  Lipids   Total Cholesterol: 187   (10/31/2008)   LDL: 113  (10/31/2008)   LDL Direct: 107  (10/21/2007)   HDL: 63  (10/31/2008)   Triglycerides: 53  (10/31/2008)    SGOT (AST): 15  (10/31/2008)   SGPT (ALT): 13  (10/31/2008) CMP ordered    Alkaline phosphatase: 71  (10/31/2008)   Total bilirubin: 0.6  (10/31/2008)    Lipid flowsheet reviewed?: Yes   Progress toward LDL goal: Unchanged  Hypertension   Last Blood Pressure: 156 / 77  (08/08/2009)   Serum creatinine: 1.02  (10/31/2008)   Serum potassium 4.2  (10/31/2008) CMP ordered     Hypertension flowsheet reviewed?: Yes   Progress toward BP goal: Deteriorated  Self-Management Support :   Personal Goals (by the next clinic visit) :     Personal A1C goal: 8  (08/08/2009)     Personal blood pressure goal: 130/80  (04/06/2009)     Personal LDL goal: 100  (04/06/2009)    Diabetes self-management support: Written self-care plan  (04/06/2009)    Diabetes self-management support not done because: Good outcomes  (08/08/2009)    Hypertension self-management support: BP self-monitoring log, Written self-care plan, Education handout  (08/08/2009)   Hypertension self-care plan printed.   Hypertension education handout printed    Lipid self-management support: Not documented     Lipid self-management support not done because: Not indicated  (08/08/2009)

## 2010-09-10 NOTE — Progress Notes (Signed)
Summary: Rx Req  Phone Note Refill Request Call back at Home Phone [phone] Message from:  Patient  Refills Requested: Medication #1:  DIOVAN HCT 320-25 MG TABS 1 by mouth once daily for blood pressure  Medication #2:  METFORMIN HCL 500 MG TABS 1 po two times a day for diabetes [facility] HAS APPT ON THE 28TH.  Initial call taken by: Raymond Gurney,  May 28, 2010 4:41 PM  Follow-up for Phone Call        enough medication sent in for 2 weeks . patient notified. Follow-up by: Marcell Barlow RN,  May 28, 2010 5:16 PM

## 2010-09-10 NOTE — Letter (Signed)
Summary: Appt Reminder [facility]  [place], [place] [address]   Phone: [phone]  Fax: [phone]        November 07, 2009 MRN: [id number]    DOSHIA DILLEHAY [address] [place], [place]  [address]    Dear Ms. GUERRIERO,   You have a return appointment with Dr.Robert [name] on 12-10-09 at 9:30am. Please remember to bring a complete list of the medicines you are taking, your insurance card and your co-pay.  If you have to cancel or reschedule this appointment, please call before 5:00 pm the evening before to avoid a cancellation fee.  If you have any questions or concerns, please call [phone].    Sincerely,    Vivia Ewing LPN  Appended Document: Appt Reminder 2 Letter mailed to patient.

## 2010-09-10 NOTE — Progress Notes (Signed)
Summary: results  Phone Note Call from Patient Call back at Work Phone [phone]   Caller: Patient Summary of Call: pt wants results of recent lab work  Initial call taken by: Audie Clear,  November 23, 2009 12:14 PM  Follow-up for Phone Call        gave her an overview & discussed lipids in detail & reviewed dietary sources of cholesterol. gave xamples of how to reduce portion size & eat less meat. she loves vegt. told her to eat at least 5 servings a day & told her about the fiber bonus, good nutrition, no cholesterol. she will increase them & reduce meats Follow-up by: Elige Radon RN,  November 23, 2009 12:17 PM

## 2010-09-10 NOTE — Letter (Signed)
Summary: Diabetic Instructions  [facility]  [place], [place] [address]   Phone: [phone]  Fax: [phone]    Erin Coffey 21-Apr-1946 MRN: [id number]   X   ORAL DIABETIC MEDICATION INSTRUCTIONS METFORMIN  The day before your procedure:   Take your diabetic pill as you do normally  The day of your procedure:   Do not take your diabetic pill    We will check your blood sugar levels during the admission process and again in Recovery before discharging you home  ________________________________________________________________________  _  _   INSULIN (LONG ACTING) MEDICATION INSTRUCTIONS (Lantus, NPH, 70/30, Humulin, Novolin-N)   The day before your procedure:   Take  your regular evening dose    The day of your procedure:   Do not take your morning dose    _  _   INSULIN (SHORT ACTING) MEDICATION INSTRUCTIONS (Regular, Humulog, Novolog)   The day before your procedure:   Do not take your evening dose   The day of your procedure:   Do not take your morning dose   _  _   INSULIN PUMP MEDICATION INSTRUCTIONS  We will contact the physician managing your diabetic care for written dosage instructions for the day before your procedure and the day of your procedure.  Once we have received the instructions, we will contact you.

## 2010-09-10 NOTE — Assessment & Plan Note (Signed)
Summary: meet new md/meds/wp   Vital Signs:  Patient Profile:   65 Years Old Female Height:     69 inches Weight:      310.6 pounds BMI:     46.03 Temp:     98.4 degrees F oral Pulse rate:   71 / minute BP sitting:   157 / 84  (left arm) Cuff size:   large  Pt. in pain?   no  Vitals Entered By: Levert Feinstein LPN (October 26, 579FGE 8:27 AM)                 LDL Result Date:  10/21/2007 LDL Result:  107 direct ldl LDL Next Due:  1 yr Flex Sig Result Date:  06/05/2008 Flex Sig Next Due:  Not Indicated Last Hemoccult Result: Done. (04/11/2002 12:00:00 AM) Hemoccult Next Due:  Not Indicated Last PAP:  Done. (08/11/2006 12:00:00 AM) PAP Next Due:  3 yr Last HGBA1C:  6.0 (11/27/2006 10:33:59 AM) HGBA1C Next Due:  6 mo   Chief Complaint:  need refill on meds.  History of Present Illness: OVerall doing quite well.   Back Pain:  continues on the right side if she moves funny and causes it to act up.  using tylenol when it gets flared up which helps. previous meds she was on as needed also helped significnatly.  Using a pillow on the right side at night which also helps.  usually her pain is only bad 1 day at a time.  denies any pain radiating down the legs.  does state she has a history of DDD in her lower back dx by Dr Percell Miller of [facility].  DM:  sugars have been ranging from 87-120.  She is watching what she eats and is tolerating the metformin without diffculty.  needs a refill. is due for her eye check which she is aware of.  Dyspepsia:  omeprazole doign well for her.  HTN:  checks her BP at home.  normally running with systolics in the [id number].  is taking ARB and metoprolol without diffuclty.  norvasc caused her to feel funny.  hctz caused her to have to go to the bathroom and unable to always hold her water.  lisinopril caused side effects in the past as well.  tolerating current regimen without diffculty or side effects such as chest pain, dizziness, shortenss of breath.   medications and past medical history reviewed with patient and no changes required except as noted.     Current Allergies: LISINOPRIL (LISINOPRIL)  Past Medical History:    H/O Redux use,     hyperthryroidism resolved 06/04-PTU d/c`d, -- goiter    Recurrent vulvovaginitis    HTN    Obesity    Paresthesias    Osteoarthrtis    GERD    Back pain    Glucose intolerance.    stress echo normal 02/08    Risk Factors:  Mammogram History:     Date of Last Mammogram:  06/28/2007   PAP Smear History:     Date of Last PAP Smear:  08/11/2006   Colonoscopy History:     Date of Last Colonoscopy:  12/10/2002    Review of Systems      See HPI   Physical Exam  General:     Well-developed,well-nourished,in no acute distress; alert,appropriate and cooperative throughout examination Nose:     External nasal examination shows no deformity or inflammation. Nasal mucosa are pink and moist without lesions or exudates. Mouth:  Oral mucosa and oropharynx without lesions or exudates. no upper teeth - dentures.  her own on bottom. Neck:     goiter. Lungs:     Normal respiratory effort, chest expands symmetrically. Lungs are clear to auscultation, no crackles or wheezes. Heart:     Normal rate and regular rhythm. S1 and S2 normal without gallop, murmur, click, rub or other extra sounds. Extremities:     no edema    Impression & Recommendations:  Problem # 1:  BACK STRAIN (ICD-847.9) Assessment: Unchanged given handout on low back exercises.  also refilled meds to use as needed for back strain.  no indiction for xrays at this point.  Orders: [place]- Est  Level 4 [id number])   Problem # 2:  HYPERTENSION, BENIGN ESSENTIAL (ICD-401.1) Assessment: Unchanged very close to goal based on home numbers.  hadn't taken her med this AM.  increase diovan as likely will get to goal with this slight change. follow BP at home.  f/u on 3 months.  The following medications were removed from the  medication list:    Norvasc 10 Mg Tabs (Amlodipine besylate) .[name]... 1 by mouth daily  Her updated medication list for this problem includes:    Diovan 320 Mg Tabs (Valsartan) .[name]... 1  by mouth once daily    Metoprolol Tartrate 50 Mg Tabs (Metoprolol tartrate) .[name]... Take 1 tablet by mouth twice a day  Orders: [place]- Est  Level 4 [id number])   Problem # 3:  DIABETES MELLITUS, TYPE II, CONTROLLED, MILD (ICD-250.00) Assessment: Unchanged continue current regimen.  well controlled.  no changes.  a1c every 6 mo.  Her updated medication list for this problem includes:    Aspir-81 81 Mg Tbec (Aspirin) .[name]... Take 1 tablet by mouth every morning    Diovan 320 Mg Tabs (Valsartan) .[name]... 1  by mouth once daily    Metformin Hcl 500 Mg Tabs (Metformin hcl) .[name]... 1 po two times a day.  last refill before visit with primary doctor.  Orders: [place]- Est  Level 4 ([id number])   Problem # 4:  GASTROESOPHAGEAL REFLUX, NO ESOPHAGITIS (ICD-530.81) Assessment: Unchanged prilosec continues to work well.  no red flag symptoms.  Her updated medication list for this problem includes:    Prilosec 20 Mg Cpdr (Omeprazole) .[name]... Take 1 capsule by mouth once a day  Orders: [facility]- Est  Level 4 [id number])   Complete Medication List: 1)  Aspir-81 81 Mg Tbec (Aspirin) .... Take 1 tablet by mouth every morning 2)  Diovan 320 Mg Tabs (Valsartan) .[name].. 1  by mouth once daily 3)  Metformin Hcl 500 Mg Tabs (Metformin hcl) .[name].. 1 po two times a day.  last refill before visit with primary doctor. 4)  Metoprolol Tartrate 50 Mg Tabs (Metoprolol tartrate) .... Take 1 tablet by mouth twice a day 5)  Prilosec 20 Mg Cpdr (Omeprazole) .... Take 1 capsule by mouth once a day 6)  Diclofenac Sodium 75 Mg Tbec (Diclofenac sodium) .[name].. 1 by mouth bid 7)  Flexeril 10 Mg Tabs (Cyclobenzaprine hcl) .[name].. 1 by mouth at bedtime prn  Other Orders: A1C-[facility] [id number]) Influenza Vaccine NON MCR [id number])   Patient Instructions: 1)  Lets plan on seeing each  other again in 3 months (or of course sooner) to recheck your blood pressure to see how it is doing. 2)  Remember to get your eye exam set up.  3)  It was good to meet you!   Prescriptions: DIOVAN 320 MG TABS (VALSARTAN) 1  by  mouth once daily  #30 x 3   Entered and Authorized by:   Patria Mane  MD   Signed by:   Patria Mane  MD on 06/05/2008   Method used:   Electronically to        [facility]. [id number]* (retail)       [address]       [place], [place]  [address]       Ph: [phone] or [phone]       Fax: [phone]   RxID:   [id number] FLEXERIL 10 MG  TABS (CYCLOBENZAPRINE HCL) 1 by mouth at bedtime prn  #10 x 0   Entered and Authorized by:   Patria Mane  MD   Signed by:   Patria Mane  MD on 06/05/2008   Method used:   Electronically to        [facility]. [id number]* (retail)       [address]       [place], [place]  [address]       Ph: [phone] or [phone]       Fax: [phone]   RxID:   [id number] DICLOFENAC SODIUM 75 MG  TBEC (DICLOFENAC SODIUM) 1 by mouth bid  #30 x 0   Entered and Authorized by:   Patria Mane  MD   Signed by:   Patria Mane  MD on 06/05/2008   Method used:   Electronically to        [facility]. [id number]* (retail)       [address]       [place], [place]  [address]       Ph: [phone] or [phone]       Fax: [phone]   RxID:   [id number] METFORMIN HCL 500 MG TABS (METFORMIN HCL) 1 po two times a day.  last refill before visit with primary doctor.  #60 x 5   Entered and Authorized by:   Patria Mane  MD   Signed by:   Patria Mane  MD on 06/05/2008   Method used:   Electronically to        [facility]. [id number]* (retail)       [address]       [place], [place]  [address]       Ph: [phone] or [phone]       Fax: [phone]    RxID:   [id number]  ] Laboratory Results   Blood Tests   Date/Time Received: June 05, 2008 8:31 AM  Date/Time Reported: June 05, 2008 9:04 AM   HGBA1C: 6.1%   (Normal Range: Non-Diabetic - 3-6%   Control Diabetic - 6-8%)  Comments: ...............test performed by......Marland KitchenBonnie A. [name], [place] (ASCP)      Influenza Vaccine    Vaccine Type: Fluvax Non-MCR    Site: left deltoid    Mfr: GlaxoSmithKline    Dose: 0.5 ml    Route: IM    Given by: DELORES PATE CMA,    Exp. Date: 02/07/2009    Lot #: [id number]    VIS given: 03/04/07 version given June 05, 2008.  Flu Vaccine Consent Questions    Do you have a  history of severe allergic reactions to this vaccine? no    Any prior history of allergic reactions to egg and/or gelatin? no    Do you have a sensitivity to the preservative Thimersol? no    Do you have a past history of Guillan-Barre Syndrome? no    Do you currently have an acute febrile illness? no    Have you ever had a severe reaction to latex? no    Vaccine information given and explained to patient? yes    Are you currently pregnant? no

## 2010-09-10 NOTE — Consult Note (Signed)
Summary: [facility]  [facility]   Imported By: Beryle Lathe 11/01/2009 16:43:48  _____________________________________________________________________  External Attachment:    Type:   Image     Comment:   External Document

## 2010-09-10 NOTE — Assessment & Plan Note (Signed)
    Current Allergies: LISINOPRIL (LISINOPRIL)    Risk Factors:  Mammogram History:     Date of Last Mammogram:  06/28/2007    Results:  normal      Complete Medication List: 1)  Aspir-81 81 Mg Tbec (Aspirin) .... Take 1 tablet by mouth every morning 2)  Diovan Hct 160-25 Mg Tabs (Valsartan-hydrochlorothiazide) .... Take 1 tablet by mouth once a day 3)  Ibuprofen 800 Mg Tabs (Ibuprofen) .... Take 1 tablet by mouth three times a day 4)  Metformin Hcl 500 Mg Tabs (Metformin hcl) 5)  Metoprolol Tartrate 50 Mg Tabs (Metoprolol tartrate) .... Take 1 tablet by mouth twice a day 6)  Prilosec 20 Mg Cpdr (Omeprazole) .... Take 1 capsule by mouth once a day     ]  Preventive Care Screening  Mammogram:    Date:  06/28/2007    Next Due:  07/2008    Results:  normal

## 2010-09-10 NOTE — Progress Notes (Signed)
Summary: pls call  Phone Note Call from Patient Call back at Home Phone [phone]   Caller: Patient Summary of Call: pt returned call Initial call taken by: Audie Clear,  October 11, 2009 4:01 PM  Follow-up for Phone Call        spoke with patient about her appointment.  she will see Dr. [name] on 10/24/2009.  patient needs refill on glucose strips and fax to [facility], [phone]. will forward message to MD. Follow-up by: Marcell Barlow RN,  October 11, 2009 5:18 PM  Additional Follow-up for Phone Call Additional follow up Details #1::        did I not fax the strips to the right place? thanks Additional Follow-up by: Eugenie Norrie  MD,  October 12, 2009 10:50 AM    Additional Follow-up for Phone Call Additional follow up Details #2::    patient notified that Rx were sent on 10/10/2009. Follow-up by: Marcell Barlow RN,  October 12, 2009 11:01 AM

## 2010-09-10 NOTE — Procedures (Signed)
Summary: Instructions for procedure/[facility] (Out pt)  Instructions for procedure/[facility] (Out pt)   Imported By: Phillis Knack 10/26/2009 09:53:21  _____________________________________________________________________  External Attachment:    Type:   Image     Comment:   External Document

## 2010-09-10 NOTE — Assessment & Plan Note (Signed)
Summary: F/U HTN/[facility]   Vital Signs:  Patient Profile:   65 Years Old Female Weight:      309 pounds Temp:     99.2 degrees F Pulse rate:   82 / minute BP sitting:   157 / 81  Pt. in pain?   no  Vitals Entered By: Christen Bame CMA (Dec 22, 2006 8:50 AM)                Chief Complaint:  F/U HTN.  History of Present Illness: 65 yo w:  1. Glucose intolerance - comes in fasting to check a fasting blood sugar.  am sugards 118 or so.  postprandial is 147 or so.  No problems with vision, feet.  wears comfortable shoes.  no problems with metformin.   2. Weight - continues to loose weight.  has decreased sodas, is eating better.  exercisese 2x/weeks and plans to increase that.    3. HTN.  CP with exertion but normal stress adenosine 2/08.  No SOB, no edema.  No problems with meds.      Past Medical History:    Reviewed history from 10/08/2006 and no changes required:       H/O Redux use,        hyperthryroidism resolved 06/04-PTU d/c`d,        Recurrent vulvovaginitis       HTN       Obesity       Paresthesias       Osteoarthrtis       GERD       Back pain       Glucose intolerance.  Past Surgical History:    Reviewed history from 10/08/2006 and no changes required:       ABI=1.06 (normal) - 12/14/2001, Cardiolite-EF 71% - 03/11/1996, carpel tunnel release - 11/10/2001, colonoscopy/EGD nl 12/2002 - 01/18/2003, ECHO 1/08: mild, subtle inferior hypokinesis - 09/08/2006, echo-concentric LVH, EF 70-75% - 01/09/2002, Echo-mild to mod MR,LVH, nl EF - 03/11/1996, EF 60% - 09/08/2006, fasting CBG nl 11/03 - 06/15/2002, Holter -symptomatic PVCs only 03/00 - 01/18/2003, Hysterectomy/BSO - Supracervical - 11/10/2001, Knee injection - 12/20/2002, L foot surgery - 11/10/2001, Lipid 06/22/06 TC=164, LDL=97, HDL=53 - 06/23/2006, R knee arthroscopy 05/04 - 01/26/2003, stress test, adenosine.  No signs of ischemia.  LV upper limits of normal. - 09/14/2006, Thyroid uptake scan normal - 02/08/2002   Family History:   Reviewed history from 11/27/2006 and no changes required:       father - DM, HTN, CRF, COPD, motehr - DM, HTN, PUD, MI 55s, sisters and brothers - DM, HTN       **early FHx of CAD - diffuse family history ot MI in 20s.  Social History:    Reviewed history from 11/27/2006 and no changes required:       no tob/ETOH/drugs; widow, 3 grown children Deedee, Covin); 7 grandchildren and 3 great grandchildren.  . On disablility 12/04 for OA knees/spine.  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  Walks 2x/week.   Risk Factors:  Tobacco use:  never Passive smoke exposure:  no Drug use:  no HIV high-risk behavior:  no Alcohol use:  no Exercise:  yes    Times per week:  2    Type:  walking Seatbelt use:  100 %  Family History Risk Factors:    Family History of MI in females < 46 years old:  yes    Family History of MI  in males < 18 years old:  yes  Colonoscopy History:    Date of Last Colonoscopy:  12/10/2002  Mammogram History:    Date of Last Mammogram:  06/11/2006  PAP Smear History:    Date of Last PAP Smear:  08/11/2006    Physical Exam  General:     Well-developed,well-nourished,in no acute distress; alert,appropriate and cooperative throughout examination Neck:     No deformities, masses, or tenderness noted. Chest Wall:     No deformities, masses, or tenderness noted. Lungs:     Normal respiratory effort, chest expands symmetrically. Lungs are clear to auscultation, no crackles or wheezes. Heart:     Normal rate and regular rhythm. S1 and S2 normal without gallop, murmur, click, rub or other extra sounds. Extremities:     No clubbing, cyanosis, edema, or deformity noted with normal full range of motion of all joints.      Impression & Recommendations:  Problem # 1:  HYPERTENSION, BENIGN SYSTEMIC (ICD-401.1) Assessment: Unchanged did not take her medications today due to fasting.  Usually well controlled.   grandchildren broke her machine but checks occasionally at [employer].  No concerns.   Orders: [facility]- Est Level  3 [id number])   Problem # 2:  GLUCOSE INTOLERANCE (ICD-271.3) Assessment: Unchanged Fasting blood sugar.  112.  Doing well on metformin.   Orders: Glucose Cap-[facility] [id number]) [facility]- Est Level  3 [id number])   Problem # 3:  OBESITY, NOS (ICD-278.00) Assessment: Improved lsoing weight.  increase walking.     Laboratory Results   Blood Tests   Date/Time Recieved: Dec 22, 2006 9:20 AM  Date/Time Reported: Dec 22, 2006 9:26 AM   CBG Fasting: 112  Comments: ...................................................................DON[facility]  Dec 22, 2006 9:26 AM

## 2010-09-10 NOTE — Miscellaneous (Signed)
Summary: testing frequency  [facility] called to find out how often she tests. we have once a day. pt told them twice a day. told her I will check with pcp monday & call her back if md wants to increase times. [phone]. ref [id number].Elige Radon RN  October 19, 2009 5:03 PM  Medications: Changed medication from [place] W/DEVICE KIT (BLOOD GLUCOSE MONITORING SUPPL) for checking sugars once daily to [place] W/DEVICE KIT (BLOOD GLUCOSE MONITORING SUPPL) for checking sugars two times a day Changed medication from BAYER CONTOUR TEST  STRP (GLUCOSE BLOOD) for checking sugars once daily to BAYER CONTOUR TEST  STRP (GLUCOSE BLOOD) for checking sugars two times a day; disp QS for one month - Signed Rx of BAYER CONTOUR TEST  STRP (GLUCOSE BLOOD) for checking sugars two times a day; disp QS for one month;  #200 x 11;  Signed;  Entered by: Eugenie Norrie  MD;  Authorized by: Patria Mane  MD;  Method used: [facility]ed to [facility], , , [place]  , Ph: , Fax: [phone]    Prescriptions: BAYER CONTOUR TEST  STRP (GLUCOSE BLOOD) for checking sugars two times a day; disp QS for one month  #200 x 11   Entered by:   Eugenie Norrie  MD   Authorized by:   Patria Mane  MD   Signed by:   Eugenie Norrie  MD on 10/19/2009   Method used:   Faxed to ...       [facility]             , [place]         Ph:        Fax: [phone]   RxID:   [id number]

## 2010-09-10 NOTE — Letter (Signed)
Summary: [facility] Instructions  [facility]  [place], [place] [address]   Phone: [phone]  Fax: [phone]       Erin Coffey    March 31, 1946    MRN: [id number]        Procedure Day /Date:TUESDAY 11/06/2009     Arrival Time:8:30AM     Procedure Time:9:30AM     Location of Procedure:                     X   [facility] ( Outpatient Registration)                        [place]   Starting 5 days prior to your procedure 3/24/2011do not eat nuts, seeds, popcorn, corn, beans, peas,  salads, or any raw vegetables.  Do not take any fiber supplements (e.g. Metamucil, Citrucel, and Benefiber).  THE DAY BEFORE YOUR PROCEDURE         DATE: 11/05/2009  DAY: MONDAY  1.  Drink clear liquids the entire day-NO SOLID FOOD  2.  Do not drink anything colored red or purple.  Avoid juices with pulp.  No orange juice.  3.  Drink at least 64 oz. (8 glasses) of fluid/clear liquids during the day to prevent dehydration and help the prep work efficiently.  CLEAR LIQUIDS INCLUDE: Water Jello Ice Popsicles Tea (sugar ok, no milk/cream) Powdered fruit flavored drinks Coffee (sugar ok, no milk/cream) Gatorade Juice: apple, white grape, white cranberry  Lemonade Clear bullion, consomm, broth Carbonated beverages (any kind) Strained chicken noodle soup Hard Candy                             4.  In the morning, mix first dose of MoviPrep solution:    Empty 1 Pouch A and 1 Pouch B into the disposable container    Add lukewarm drinking water to the top line of the container. Mix to dissolve    Refrigerate (mixed solution should be used within 24 hrs)  5.  Begin drinking the prep at 5:00 p.m. The MoviPrep container is divided by 4 marks.   Every 15 minutes drink the solution down to the next mark (approximately 8 oz) until the full liter is complete.   6.  Follow completed prep with 16 oz of clear liquid of your  choice (Nothing red or purple).  Continue to drink clear liquids until bedtime.  7.  Before going to bed, mix second dose of MoviPrep solution:    Empty 1 Pouch A and 1 Pouch B into the disposable container    Add lukewarm drinking water to the top line of the container. Mix to dissolve    Refrigerate  THE DAY OF YOUR PROCEDURE      DATE:11/06/2009 DAY: TUESDAY  Beginning at 4:30a.m. (5 hours before procedure):         1. Every 15 minutes, drink the solution down to the next mark (approx 8 oz) until the full liter is complete.  2. Follow completed prep with 16 oz. of clear liquid of your choice.    3. You may drink clear liquids until 6:30AM (4 HOURS BEFORE PROCEDURE).   MEDICATION INSTRUCTIONS  Unless otherwise instructed, you should take regular prescription medications with a small sip of water   as early as possible the morning of your procedure.  Diabetic patients - see  separate instructions.   Additional medication instructions:  HOLD IRON 7 DAYS PRIOR TO PROCEDURE         OTHER INSTRUCTIONS  You will need a responsible adult at least 63 years of age to accompany you and drive you home.   This person must remain in the waiting room during your procedure.  Wear loose fitting clothing that is easily removed.  Leave jewelry and other valuables at home.  However, you may wish to bring a book to read or  an iPod/MP3 player to listen to music as you wait for your procedure to start.  Remove all body piercing jewelry and leave at home.  Total time from sign-in until discharge is approximately 2-3 hours.  You should go home directly after your procedure and rest.  You can resume normal activities the  day after your procedure.  The day of your procedure you should not:   Drive   Make legal decisions   Operate machinery   Drink alcohol   Return to work  You will receive specific instructions about eating, activities and medications before you  leave.    The above instructions have been reviewed and explained to me by   _______________________    I fully understand and can verbalize these instructions _____________________________ Date _________

## 2010-09-10 NOTE — Consult Note (Signed)
Summary: Diabetic Eye Examination Report  Diabetic Eye Examination Report   Imported By: Raymond Gurney 02/12/2010 16:21:13  _____________________________________________________________________  External Attachment:    Type:   Image     Comment:   External Document

## 2010-09-10 NOTE — Letter (Signed)
Summary: [facility]: Immunoassay Fecal Occult Blood (iFOB) Order [facility]  [address] [place], [place] [address]   Phone: [phone]  Fax: [phone]      [facility]: Immunoassay Fecal Occult Blood (iFOB) Order Form   October 24, 2009 MRN: [id number]   Erin Coffey [birth year]   Physicican Name:Kiri Hinderliter Diagnosis Code:280.9     Genella Mech CMA ([place])

## 2010-09-10 NOTE — Progress Notes (Signed)
Summary: Question  Phone Note Call from Patient Call back at Home Phone [phone]   Summary of Call: Pt has a question about lab results letter she recieved, mainly cholesterol. Initial call taken by: Drucie Ip,  October 26, 2007 12:20 PM  Follow-up for Phone Call        reviewed results with her & discussed diet & exercise. Gave specific examples of food high in chol & lower chol alternatives. suggested a food diary so md can review at next visit. told her we could print out foods & chol content for her or she can get the same via internet at [employer] Follow-up by: Elige Radon RN,  October 26, 2007 1:34 PM

## 2010-09-10 NOTE — Progress Notes (Signed)
  Phone Note Call from Patient   Caller: Patient Call For: Patria Mane  MD Summary of Call: ms Doffing called pharmacy regarding her rx, but was told it was not there.  Please call them and let me know when ready. Initial call taken by: Pamala Hurry mcgregor  Follow-up for Phone Call        [facility] make sure [facility] received [facility] prescriptions for prednisone and iron. thanks Follow-up by: Patria Mane  MD,  August 15, 2009 3:40 PM  Additional Follow-up for Phone Call Additional follow up Details #1::        LM for pt to call back. I want to confirm which [facility] she uses. I sent rx to the one in chart Additional Follow-up by: Elige Radon RN,  August 15, 2009 3:43 PM    Prescriptions: FERROUS SULFATE 325 (65 FE) MG TABS (FERROUS SULFATE) 1 by mouth once daily for anemia.  #32 x 3   Entered by:   Elige Radon RN   Authorized by:   Patria Mane  MD   Signed by:   Elige Radon RN on 08/15/2009   Method used:   Electronically to        [facility] (retail)       [place], [place]  [id number]       Ph: [phone]       Fax: [phone]   RxID:   [id number] PREDNISONE 10 MG TABS (PREDNISONE) 1.5 tabs by mouth once daily for shoulder pain.  #48 x 1   Entered by:   Elige Radon RN   Authorized by:   Patria Mane  MD   Signed by:   Elige Radon RN on 08/15/2009   Method used:   Electronically to        [facility] (retail)       [place], [place]  [id number]       Ph: [phone]       Fax: [phone]   [id number]   Appended Document:  states she just picked them up. advised methods to help with constipation form taking iron

## 2010-09-10 NOTE — Assessment & Plan Note (Signed)
Summary: meet new doc/bp meds refills/bmc   Vital Signs:  Patient profile:   65 year old female Height:      68.25 inches Weight:      294.6 pounds BMI:     44.63 Temp:     98.6 degrees F oral Pulse rate:   70 / minute BP sitting:   151 / 69  (left arm) Cuff size:   large  Vitals Entered By: Enid Skeens, CMA (June 13, 2010 2:54 PM) CC: Meet new doc Is Patient Diabetic? Yes Did you bring your meter with you today? No   Referring Provider:  Madison Hickman, MD Primary Provider:  Cletus Gash MD  CC:  Meet new doc.  History of Present Illness: Hand pain- in between pointer and middle digit on right hand. Has been bothering her for a few months. Hurts to grasp. Pt. spends lots of time cooking and it interferes with this. No shooting pains up her wrist, no numbness.   BP- runs high when she checks it at home, systolics [id number]. Denies HA, chest pain, shortness of breath.   DM- under control, 90's in am, 120's pm. Denies hypoglycemic episodes.   Anemia- hx of, pt has no current complaints of fatigue, but wonders how her blood counts are.   Preventive Screening-Counseling & Management  Alcohol-Tobacco     Alcohol drinks/day: 0     Smoking Status: never     Passive Smoke Exposure: no     Tobacco Counseling: not indicated; no tobacco use  Allergies: 1)  Lisinopril (Lisinopril)  Review of Systems       Negative except noted in HPI.   Physical Exam  General:  Well-developed,well-nourished,in no acute distress; alert,appropriate and cooperative throughout examination Head:  Normocephalic and atraumatic without obvious abnormalities.  Mouth:  Oral mucosa and oropharynx without lesions or exudates.  Teeth in good repair. Neck:  No deformities, masses, or tenderness noted. Lungs:  Normal respiratory effort, chest expands symmetrically. Lungs are clear to auscultation, no crackles or wheezes. Heart:  Normal rate and regular rhythm. S1 and S2 normal without gallop,  murmur, click, rub or other extra sounds. Abdomen:  Bowel sounds positive,abdomen soft and non-tender without masses, organomegaly or hernias noted. Msk:  Pain with palpation between second and third metatarsals in right hand.  Negative tinnells sign.  Grip strength limited by pain on right side.  No obvious swelling or deformity.  Pulses:  R and L carotid,radial,femoral,dorsalis pedis and posterior tibial pulses are full and equal bilaterally Extremities:  No lower extremity edema Neurologic:  No sensory or motor deficit in right hand compared to left.    Impression & Recommendations:  Problem # 1:  DIABETES MELLITUS, TYPE II, CONTROLLED, MILD (ICD-250.00) Pt. doing well, A1c is 6.1%.  No medication changes.  Her updated medication list for this problem includes:    Aspir-81 81 Mg Tbec (Aspirin) .[name]... Take 1 tablet by mouth every morning    Diovan Hct 320-25 Mg Tabs (Valsartan-hydrochlorothiazide) .[name]... 1 by mouth once daily for blood pressure    Metformin Hcl 500 Mg Tabs (Metformin hcl) .[name]... 1 po two times a day for diabetes  Orders: A1C-[facility] [id number]) [place]- Est  Level 4 [id number])  Problem # 2:  HYPERTENSION, BENIGN ESSENTIAL (ICD-401.1) Assessment: Unchanged Not controlled on current regimen.  Will increase metoprolol and continue to monitor.  Her updated medication list for this problem includes:    Diovan Hct 320-25 Mg Tabs (Valsartan-hydrochlorothiazide) .[name]... 1 by mouth once daily for blood  pressure    Metoprolol Tartrate 100 Mg Tabs (Metoprolol tartrate) ..... One by mouth daily  Orders: [place]- Est  Level 4 (99214)  Problem # 3:  TRIGGER FINGER, RIGHT MIDDLE (ICD-727.03) Gave pt. education info on trigger finger.  Advised pt. that she could take 1000 mg of Tylenol three times a day to control the pain.  Also advised rest.  Advised not taking NSAIDs due to history of renal insufficiency.  Will consider referral to sports medicine if pain does not improve.  Orders: [place]- Est  Level 4  [id number])  Problem # 4:  UNSPECIFIED IRON DEFICIENCY ANEMIA (ICD-280.9) Pt. is currently asymptomatic, but I will check a CBC to monitor her anemia. Continue iron supplementation.  Her updated medication list for this problem includes:    Ferrous Sulfate 325 (65 Fe) Mg Tabs (Ferrous sulfate) .[name]... 1 by mouth once daily for anemia.  Orders: CBC-[facility] [id number]) [place]- Est  Level 4 [id number])  Complete Medication List: 1)  Aspir-81 81 Mg Tbec (Aspirin) .... Take 1 tablet by mouth every morning 2)  Diovan Hct 320-25 Mg Tabs (Valsartan-hydrochlorothiazide) .[name].. 1 by mouth once daily for blood pressure 3)  Metformin Hcl 500 Mg Tabs (Metformin hcl) .[name].. 1 po two times a day for diabetes 4)  Metoprolol Tartrate 100 Mg Tabs (Metoprolol tartrate) .... One by mouth daily 5)  Prilosec 20 Mg Cpdr (Omeprazole) .... Take 1 capsule by mouth once a day 6)  Ferrous Sulfate 325 (65 Fe) Mg Tabs (Ferrous sulfate) .[name].. 1 by mouth once daily for anemia. 7)  Contour Blood Glucose System W/device Kit (Blood glucose monitoring suppl) .... For checking sugars two times a day 8)  Bayer Contour Test Strp (Glucose blood) .... For checking sugars two times a day; disp qs for one month 9)  Calcium Plus Vitamin D 500-50 Mg-unit Caps (Calcium carbonate-vitamin d) .[name].. 1 capsule by mouth once daily 10)  Ra Col-rite 250 Mg Caps (Docusate sodium) .[name].. 1 capsule by mouth once daily 11)  Triamcinolone Acetonide 0.1 % Crea (Triamcinolone acetonide) .... Apply to affected area once daily when itching  Patient Instructions: 1)  It was nice to meet you today. 2)  For your hand pain, I want you to take tylenol, two pills three times a day.  Do not take more than this. Please rest your hand as much as possible. If it does not improve, please let me know.  3)  For your blood pressure, I have increased metoprolol to 100 mg twice a day.  You may take two pills twice a day until you run out of the 50 mg size pills.  4)  I will send you a letter  with your lab results.  5)  Please contact the office if you have questions.  Prescriptions: TRIAMCINOLONE ACETONIDE 0.1 % CREA (TRIAMCINOLONE ACETONIDE) apply to affected area once daily when itching  #1 x 2   Entered and Authorized by:   Cletus Gash MD   Signed by:   Cletus Gash MD on 06/13/2010   Method used:   Electronically to        [facility] (retail)       [place], [place]  [id number]       Ph: [phone]       Fax: [phone]   [id number] METOPROLOL TARTRATE 100 MG TABS (METOPROLOL TARTRATE) one by mouth daily  #60 x 3   Entered and Authorized  by:   Cletus Gash MD   Signed by:   Cletus Gash MD on 06/13/2010   Method used:   Electronically to        [facility] (retail)       [place], [place]  [id number]       Ph: [phone]       Fax: [phone]   RxID:   [id number] METFORMIN HCL 500 MG TABS (METFORMIN HCL) 1 po two times a day for diabetes  #60 x 3   Entered and Authorized by:   Cletus Gash MD   Signed by:   Cletus Gash MD on 06/13/2010   Method used:   Electronically to        [facility] (retail)       [place], [place]  [id number]       Ph: [phone]       Fax: [phone]   [id number] DIOVAN HCT 320-25 MG TABS (VALSARTAN-HYDROCHLOROTHIAZIDE) 1 by mouth once daily for blood pressure  #30 x 3   Entered and Authorized by:   Cletus Gash MD   Signed by:   Cletus Gash MD on 06/13/2010   Method used:   Electronically to        [facility] (retail)       [place], [place]  [id number]       Ph: [phone]       Fax: [phone]   RxID:   [id number]    Orders Added: 1)  A1C-[facility] [83036] 2)  CBC-[facility] [id number] 3)  [facility]- Est  Level 4 [id number]    Laboratory Results   Blood Tests     Date/Time Received: June 13, 2010 2:59 PM  Date/Time Reported: June 13, 2010 3:18 PM   HGBA1C: 6.1%   (Normal Range: Non-Diabetic - 3-6%   Control Diabetic - 6-8%)  Comments: ...............test performed by......Marland KitchenBonnie A. [name], MLS (ASCP)cm

## 2010-09-10 NOTE — Miscellaneous (Signed)
Summary: Handicap placard request form  Clinical Lists Changes Form left in box in Triage [facility]  January 14, 2008 9:00 AM form to md chart box for signature.Gay Filler CALDWELL RN  January 14, 2008 9:45 AM   mailed to patient...WHITNEY POOLE 01/14/08 1:44pm

## 2010-09-10 NOTE — Assessment & Plan Note (Signed)
Summary: yearly physical wp   Vital Signs:  Patient profile:   65 year old female Height:      69 inches Weight:      316 pounds BMI:     46.83 Temp:     99 degrees F Pulse rate:   66 / minute BP sitting:   178 / 80  Vitals Entered By: Elray Mcgregor RN (October 31, 2008 8:43 AM)  Serial Vital Signs/Assessments:  Time      Position  BP       Pulse  Resp  Temp     By                     170/84                         Patria Mane  MD  CC: CPE Is Patient Diabetic? Yes  Pain Assessment Patient in pain? yes     Location: r hip Intensity: 8   History of Present Illness: CPE: no major complaints today.  Due for cholesterol check and Cmet check today for preventative care.  otherwise up to date on screening.  Does not desire Zostravax - cannot afford it.  HLD: not on any med currently.  depending on result today may need to be on statin.  will also check CMet today  DM2: doing well.  CBGs running 87-111 fasting in AM. has planned eye exam on Monday. Is seeing podiatrist as well for foot and nail checks.  OA/DJD: having trouble with back and hip as of recent.  worse with weather changes.  being followed by Dr Percell Miller of [facility].  Doesn't want any stronger pain meds than diclofenac  GERD: doing well with prilosec alone  HTN: has been checking BP at home and typically running 130s/60s. she thinks maybe pain is why she is elevated today.  Has noticed some edema since being off of the water pill and would like to be back on the water pill.  No chest pain.  occasional shortness of breath with exercise.  occasional palpitations but unchanged from prior.    Goiter: has been a while since she last had her thyroid checked.  weight has been up and down.  also having some tenderness in R neck.  would like her thyroid checked.   Habits & Providers     Alcohol drinks/day: 0     Tobacco Status: never     Tobacco Counseling: not indicated; no tobacco use     Does Patient Exercise:  yes     Exercise Counseling: to improve exercise regimen     Type of exercise: walking     STD Risk: never     Drug Use: never     Orthopedist: Dr Percell Miller, [facility]     Podiatrist: [facility]  Current Medications (verified): 1)  Aspir-81 81 Mg Tbec (Aspirin) .... Take 1 Tablet By Mouth Every Morning 2)  Diovan Hct 320-25 Mg Tabs (Valsartan-Hydrochlorothiazide) .[name].. 1 By Mouth Once Daily For Blood Pressure 3)  Metformin Hcl 500 Mg Tabs (Metformin Hcl) .[name].. 1 Po Two Times A Day.  Last Refill Before Visit With Primary Doctor. 4)  Metoprolol Tartrate 50 Mg Tabs (Metoprolol Tartrate) .... Take 1 Tablet By Mouth Twice A Day 5)  Prilosec 20 Mg Cpdr (Omeprazole) .... Take 1 Capsule By Mouth Once A Day 6)  Diclofenac Sodium 75 Mg  Tbec (Diclofenac Sodium) .[name].. 1 By Mouth  Two Times A Day For Pain 7)  Cyclobenzaprine Hcl 10 Mg Tabs (Cyclobenzaprine Hcl) .... 1/2 - 1 Tablets 1-2 Times Daily As Needed For Muscle Spasm  Allergies (verified): 1)  Lisinopril (Lisinopril)  Past History:  Family History:    father - DM, HTN, CRF, COPD,    motehr - DM, HTN, PUD, MI 23s,     sisters and brothers - DM, HTN    Leukemia - sister died at 104 yo    Sister died of malnutrition    **early FHx of CAD - diffuse family history ot MI in 22s. (10/21/2007)  Social History:    no tob/ETOH/drugs; widow, 3 grown children Ketrina Murchison, Candyce, Alcantara); 7 grandchildren and 5 great grandchildren.   On disablility 12/04 for OA knees/spine.  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  Walks occasionally. (10/21/2007)  Past Medical History:    H/O Redux use,     hyperthryroidism resolved 06/04-PTU d/c`d, -- goiter    Recurrent vulvovaginitis    HTN    Obesity    Paresthesias    Osteoarthrtis/ DJD    GERD    Back pain    DM2.     stress echo normal 02/08  Past Surgical History:    ABI=1.06 (normal) - 12/14/2001    carpel tunnel release -  11/10/2001    EGD nl 12/2002 - 01/18/2003    ECHO 1/08: mild, subtle inferior hypokinesis - 09/08/2006    Holter -symptomatic PVCs only 03/00 - 01/18/2003    Hysterectomy/BSO - Supracervical - 11/10/2001    L foot surgery - 11/10/2001    R knee arthroscopy 05/04    Thyroid uptake scan normal - 02/08/2002  Social History:    STD Risk:  never    Drug Use:  never  Review of Systems       per HPI otherwise negative on > 12 points  Physical Exam  General:  Well-developed,well-nourished,in no acute distress; alert,appropriate and cooperative throughout examination.  Morbidly obese Head:  Normocephalic and atraumatic without obvious abnormalities. No apparent alopecia or balding. Eyes:  No corneal or conjunctival inflammation noted. EOMI. Perrla. Vision grossly normal. Ears:  External ear exam shows no significant lesions or deformities.   Nose:  External nasal examination shows no deformity or inflammation. Nasal mucosa are pink and moist without lesions or exudates. Mouth:  Oral mucosa and oropharynx without lesions or exudates. no upper teeth - dentures.  her own on bottom..  poor dentition.   Neck:  goiter. some tenderness R submandibular lymph nodes.  all <1cm and mobile.  Lungs:  Normal respiratory effort, chest expands symmetrically. Lungs are clear to auscultation, no crackles or wheezes. Heart:  Normal rate and regular rhythm. S1 and S2 normal without gallop, murmur, click, rub or other extra sounds. Abdomen:  obese, nentender. +bs Msk:  good ROM at back.  No pain over spinous processes, paraspinous muscles.  good sensation bilateral feet. negative bilateral seated straight leg raise.   Diabetes Management Exam:    Foot Exam (with socks and/or shoes not present):       Sensory-Pinprick/Light touch:          Left medial foot (L-4): normal          Left dorsal foot (L-5): normal          Left lateral foot (S-1): normal          Right medial foot (L-4): normal  Right dorsal foot  (L-5): normal          Right lateral foot (S-1): normal       Sensory-Monofilament:          Left foot: normal          Right foot: normal       Inspection:          Left foot: normal          Right foot: normal       Nails:          Left foot: thickened          Right foot: thickened   Impression & Recommendations:  Problem # 1:  WELL WOMAN (ICD-V70.0) Assessment Unchanged  overall doing well.  working on her chronic issues.  will continue to monitor and keep up to date on screening.   Orders: [place] - Est  40-64 yrs [id number])  Problem # 2:  HYPERLIPIDEMIA (ICD-272.4) Assessment: Unchanged may need statin.  await results.  Orders: Lipid-[facility] [id number]) Comp Met-[facility] [id number])  Problem # 3:  DIABETES MELLITUS, TYPE II, CONTROLLED, MILD (ICD-250.00) Assessment: Unchanged  A1C is good.  continue to monitor. Her updated medication list for this problem includes:    Aspir-81 81 Mg Tbec (Aspirin) .[name]... Take 1 tablet by mouth every morning    Diovan Hct 320-25 Mg Tabs (Valsartan-hydrochlorothiazide) .[name]... 1 by mouth once daily for blood pressure    Metformin Hcl 500 Mg Tabs (Metformin hcl) .[name]... 1 po two times a day.  last refill before visit with primary doctor.  Orders: A1C-[facility] [id number]) TSH-[facility] [id number])  Problem # 4:  GOITER, UNSPECIFIED (ICD-240.9) Assessment: Unchanged check TSH.   Problem # 5:  OSTEOARTHRITIS, MULTI SITES (ICD-715.98) Assessment: Unchanged refilled diclofenac  Her updated medication list for this problem includes:    Aspir-81 81 Mg Tbec (Aspirin) .[name]... Take 1 tablet by mouth every morning    Diclofenac Sodium 75 Mg Tbec (Diclofenac sodium) .[name]... 1 by mouth two times a day for pain  Problem # 6:  GASTROESOPHAGEAL REFLUX, NO ESOPHAGITIS (ICD-530.81) Assessment: Unchanged doing well on prilosec  Her updated medication list for this problem includes:    Prilosec 20 Mg Cpdr (Omeprazole) .[name]... Take 1 capsule by mouth once a day  Problem #  7:  HYPERTENSION, BENIGN ESSENTIAL (ICD-401.1) Assessment: Deteriorated not at goal at visit.  monitor at home.  added back HCTZ.   Her updated medication list for this problem includes:    Diovan Hct 320-25 Mg Tabs (Valsartan-hydrochlorothiazide) .[name]... 1 by mouth once daily for blood pressure    Metoprolol Tartrate 50 Mg Tabs (Metoprolol tartrate) .[name]... Take 1 tablet by mouth twice a day  Complete Medication List: 1)  Aspir-81 81 Mg Tbec (Aspirin) .... Take 1 tablet by mouth every morning 2)  Diovan Hct 320-25 Mg Tabs (Valsartan-hydrochlorothiazide) .[name].. 1 by mouth once daily for blood pressure 3)  Metformin Hcl 500 Mg Tabs (Metformin hcl) .[name].. 1 po two times a day.  last refill before visit with primary doctor. 4)  Metoprolol Tartrate 50 Mg Tabs (Metoprolol tartrate) .... Take 1 tablet by mouth twice a day 5)  Prilosec 20 Mg Cpdr (Omeprazole) .... Take 1 capsule by mouth once a day 6)  Diclofenac Sodium 75 Mg Tbec (Diclofenac sodium) .[name].. 1 by mouth two times a day for pain 7)  Cyclobenzaprine Hcl 10 Mg Tabs (Cyclobenzaprine hcl) .... 1/2 - 1 tablets 1-2 times daily as needed for muscle spasm  Patient Instructions:  1)  Please follow up in 3 months or of course sooner if needed.  2)  we have added a water pill back to your blood pressure medicines.   be sure to keep track of your blood pressure at home and if it is still high like it has been today please let me know and get an appointment for sooner. 3)  Your diabetes A1C is 6.1 today.... EXCELLENT!  Keep up the good work!  4)  I will let you know the results of your blood tests with a letter. 5)  It was good to see you today!!! Prescriptions: CYCLOBENZAPRINE HCL 10 MG TABS (CYCLOBENZAPRINE HCL) 1/2 - 1 tablets 1-2 times daily as needed for muscle spasm  #30 x 0   Entered and Authorized by:   Patria Mane  MD   Signed by:   Patria Mane  MD on 10/31/2008   Method used:   Electronically to        [facility] [id number][facility]  (retail)       [address]       [place], [place]  [address]       Ph: [phone] or [phone]       Fax: [phone]   RxID:   [id number] DICLOFENAC SODIUM 75 MG  TBEC (DICLOFENAC SODIUM) 1 by mouth two times a day for pain  #60 x 5   Entered and Authorized by:   Patria Mane  MD   Signed by:   Patria Mane  MD on 10/31/2008   Method used:   Electronically to        [facility] [id number]* (retail)       [address]       [place], [place]  [address]       Ph: [phone] or [phone]       Fax: [phone]   RxID:   [id number] DIOVAN HCT 320-25 MG TABS (VALSARTAN-HYDROCHLOROTHIAZIDE) 1 by mouth once daily for blood pressure  #30 x 5   Entered and Authorized by:   Patria Mane  MD   Signed by:   Patria Mane  MD on 10/31/2008   Method used:   Electronically to        [facility]. [id number]* (retail)       [address]       [place], [place]  [address]       Ph: [phone] or [phone]       Fax: [phone]   RxID:   [id number]   Flex Sig Next Due:  Not Indicated  Preventive Care Screening  Colonoscopy:    Next Due:  12/2012  Pap Smear:    Next Due:  08/2009   Laboratory Results   Blood Tests   Date/Time Received: October 31, 2008 9:06 AM Date/Time Reported: October 31, 2008 9:06 AM  HGBA1C: 6.1%   (Normal Range: Non-Diabetic - 3-6%   Control Diabetic - 6-8%)  Comments: .dcb

## 2010-09-10 NOTE — Progress Notes (Signed)
Summary: Rx for bp machine  Phone Note Call from Patient Call back at [facility] Phone [phone]   Summary of Call: Pt is requesting that we write rx for bp machine.  She states she cannot afford without rx. Initial call taken by: Drucie Ip,  October 26, 2007 3:00 PM  Follow-up for Phone Call        PT [place] BP  MACHINE AND THEY ARE VERY EXPENSIVE.  SHE WOULD LIKE AN RX FOR A BP CUFF HER INSURANCE WILL COVER 80 % OF THE COST.  CELL #  [id number]   Follow-up by: Carolyne Littles,  October 26, 2007 3:28 PM  Additional Follow-up for Phone Call Additional follow up Details #1::        script given to Ayisha Pol - please fax in. Additional Follow-up by: Hortencia Pilar MD,  October 27, 2007 12:00 PM    Additional Follow-up for Phone Call Additional follow up Details #2::    Left messgae on voicemail, asking pt where she would like script faxed to? Or if she would like to come pick it up? Follow-up by: Somya Jauregui LPN,  March 18, [id number] 12:23 PM  Additional Follow-up for Phone Call Additional follow up Details #3:: Details for Additional Follow-up Action Taken: pt is returning call, would like to pick up. requesting it to be left up front Additional Follow-up by: ERIN LEVAN,  October 27, 2007 2:45 PM  Script placed up front....................................................................Kendricks Reap LPN  March 18, [id number] [id number] PM

## 2010-09-10 NOTE — Assessment & Plan Note (Signed)
Summary: cold symptoms 2 weeks wp   Vital Signs:  Patient Profile:   65 Years Old Female Weight:      308 pounds Temp:     98.4 degrees F Pulse rate:   77 / minute BP sitting:   158 / 75  Pt. in pain?   no  Vitals Entered By: Christen Bame CMA (August 06, 2007 10:50 AM)                  Chief Complaint:  cold symptoms x 2 weeks.  History of Present Illness: 2 weeks of cough and congestion.  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  Cannot shake, worried since she has diabetes.  Current Allergies: LISINOPRIL (LISINOPRIL)      Physical Exam  General:     Morbidly obese Ears:     R ear normal and L ear normal.   Nose:     no nasal discharge.   Mouth:     postnasal drip.   Lungs:     Course breath sounds no wheeze Heart:     normal rate and regular rhythm.      Impression & Recommendations:  Problem # 1:  BRONCHITIS, ACUTE (ICD-466.0)  Her updated medication list for this problem includes:    Zithromax Z-pak 250 Mg Tabs (Azithromycin) .[name]... As directed    Hydromet 5-1.5 Mg/48ml Syrp (Hydrocodone-homatropine) ..... One teasp three times a day as needed., 120 cc   Complete Medication List: 1)  Aspir-81 81 Mg Tbec (Aspirin) .... Take 1 tablet by mouth every morning 2)  Diovan Hct 160-25 Mg Tabs (Valsartan-hydrochlorothiazide) .... Take 1 tablet by mouth once a day 3)  Ibuprofen 800 Mg Tabs (Ibuprofen) .... Take 1 tablet by mouth three times a day 4)  Metformin Hcl 500 Mg Tabs (Metformin hcl) .... Two times a day 5)  Metoprolol Tartrate 50 Mg Tabs (Metoprolol tartrate) .... Take 1 tablet by mouth twice a day 6)  Prilosec 20 Mg Cpdr (Omeprazole) .... Take 1 capsule by mouth once a day 7)  Zithromax Z-pak 250 Mg Tabs (Azithromycin) .... As directed 8)  Hydromet 5-1.5 Mg/74ml Syrp (Hydrocodone-homatropine) .... One teasp three times a day as needed., 120 cc   Patient Instructions: 1)  Drink as much fluid as  you can tolerate for the next few days.    Prescriptions: HYDROMET 5-1.5 MG/5ML  SYRP (HYDROCODONE-HOMATROPINE) one teasp three times a day as needed., 120 cc  #1 x 0   Entered and Authorized by:   Tereasa Coop NP   Signed by:   Tereasa Coop NP on 08/06/2007   Method used:   Handwritten   [id number] METFORMIN HCL 500 MG TABS (METFORMIN HCL) two times a day  #60 x 6   Entered and Authorized by:   Tereasa Coop NP   Signed by:   Tereasa Coop NP on 08/06/2007   Method used:   Electronically sent to ...       [facility] [id number]*       [address]       [place], [place]  [address]       Ph: [phone] or [phone]       Fax: [phone]   RxID:   [id number] ZITHROMAX Z-PAK 250 MG  TABS (AZITHROMYCIN) as directed  #1 x 0  Entered and Authorized by:   Tereasa Coop NP   Signed by:   Tereasa Coop NP on 08/06/2007   Method used:   Electronically sent to ...       [facility] [id number]*       [facility]  [address]       [place], [place]  [address]       Ph: [phone] or [phone]       Fax: [phone]   RxID:   [id number]  ]

## 2010-09-10 NOTE — Consult Note (Signed)
Summary: [facility]   Imported By: Raymond Gurney 03/05/2010 16:43:28  _____________________________________________________________________  External Attachment:    Type:   Image     Comment:   External Document

## 2010-09-10 NOTE — Progress Notes (Signed)
Summary: phn msg  Phone Note Call from Patient Call back at [phone]   Caller: Patient Summary of Call: shoulder has started hurting her again and was given prednisone and it disrupted the creatnine - wants to know if she can try to start again since it is hurting again. Initial call taken by: Audie Clear,  May 07, 2010 4:36 PM  Follow-up for Phone Call        Phone Call Completed Discussed shoulder pain with pt.  It first began last January and Dr. [name] wrote a perscription for Prednisone in February, which helped the pain.  I told patient she could take the 10 mg tabs of prednisone again, but only for 5 days.  I also told her to monitor her blood sugars while taking the prednisone.  I also informed her she needs to make an appointment to be seen for her shoulder within a week.   Follow-up by: Cletus Gash, MD

## 2010-09-10 NOTE — Assessment & Plan Note (Signed)
Summary: f/u,df   Vital Signs:  Patient profile:   65 year old female Height:      68.25 inches Weight:      299.2 pounds BMI:     45.32 Temp:     98.3 degrees F Pulse rate:   79 / minute BP sitting:   140 / 50  Vitals Entered By: Elige Radon RN (February 04, 2010 8:35 AM)  Primary Care Provider:  Patria Mane  MD  CC:  f/u DM, HTN, back pain, and kidney function.  History of Present Illness: DM: doing well.  taking metformin as prescribed.  checking sugars - typically 120s in evenings, mornings 90s-100s.  denies any hypoglycemic episodes.  denies any GI upset with metformin  HTN: taking her meds as prescribed.  not dizzy. no chest pains.  no shortness of breath.  no swelling in legs though occasionally will have a vein pop out on her leg.  does report eating watermelon with salt recently which has raised her BP a little  back pain: saw Dr Micheline Chapman who thinks her p-ain is at least in part because of a strain.  prescibed heat which is helping.  kidneys: has appt early july with Dr Lorrene Reid.  needs repeat lab work today and this faxed to them.  denies any urinary symptoms today.  Habits & Providers  Alcohol-Tobacco-Diet     Alcohol drinks/day: 0     Tobacco Status: never     Diet Comments: diabetic  Exercise-Depression-Behavior     Does Patient Exercise: no     Have you felt down or hopeless? no     Have you felt little pleasure in things? no     Drug Use: never     Seat Belt Use: always  Current Medications (verified): 1)  Aspir-81 81 Mg Tbec (Aspirin) .... Take 1 Tablet By Mouth Every Morning 2)  Diovan Hct 320-25 Mg Tabs (Valsartan-Hydrochlorothiazide) .[name].. 1 By Mouth Once Daily For Blood Pressure 3)  Metformin Hcl 500 Mg Tabs (Metformin Hcl) .[name].. 1 Po Two Times A Day For Diabetes 4)  Metoprolol Tartrate 50 Mg Tabs (Metoprolol Tartrate) .... Take 1 Tablet By Mouth Twice A Day 5)  Prilosec 20 Mg Cpdr (Omeprazole) .... Take 1 Capsule By Mouth Once A Day 6)  Ferrous Sulfate  325 (65 Fe) Mg Tabs (Ferrous Sulfate) .[name].. 1 By Mouth Once Daily For Anemia. 7)  Contour Blood Glucose System W/device Kit (Blood Glucose Monitoring Suppl) .... For Checking Sugars Two Times A Day 8)  Bayer Contour Test  Strp (Glucose Blood) .... For Checking Sugars Two Times A Day; Disp Qs For One Month 9)  Calcium Plus Vitamin D 500-50 Mg-Unit Caps (Calcium Carbonate-Vitamin D) .[name].. 1 Capsule By Mouth Once Daily 10)  Ra Col-Rite 250 Mg Caps (Docusate Sodium) .[name].. 1 Capsule By Mouth Once Daily  Allergies (verified): 1)  Lisinopril (Lisinopril)  Past History:  Past medical, surgical, family and social histories (including risk factors) reviewed for relevance to current acute and chronic problems.  Past Medical History: Reviewed history from 01/22/2010 and no changes required. H/O Redux use,  Recurrent vulvovaginitis GERD DM2.  stress echo normal 02/08 Adenomatous Colon Polyps h/o acute renal failure - seen by [facility].  - closely monitoring her cbcs, renal functions, rheumatologic tests.  BACK PAIN, THORACIC REGION (ICD-724.1) POLYMYALGIA RHEUMATICA (ICD-725) - SHOULDER PAIN, BILATERAL (ICD-719.41) UNSPECIFIED IRON DEFICIENCY ANEMIA (ICD-280.9) - PICA (ICD-307.52) ANXIETY, SITUATIONAL (ICD-308.3) HYPERLIPIDEMIA (ICD-272.4) DIABETES MELLITUS, TYPE II, CONTROLLED, MILD (ICD-250.00) GOITER, UNSPECIFIED (ICD-240.9) -  hyperthryroidism resolved 06/04-PTU d/c`d OSTEOARTHRITIS, MULTI SITES (ICD-715.98)     BACK PAIN W/RADIATION, UNSPECIFIED (ICD-724.4) OBESITY, NOS (ICD-278.00) HYPERTENSION, BENIGN ESSENTIAL (ICD-401.1)  Past Surgical History: Reviewed history from 10/31/2008 and no changes required. ABI=1.06 (normal) - 12/14/2001 carpel tunnel release - 11/10/2001 EGD nl 12/2002 - 01/18/2003 ECHO 1/08: mild, subtle inferior hypokinesis - 09/08/2006 Holter -symptomatic PVCs only 03/00 - 01/18/2003 Hysterectomy/BSO - Supracervical - 11/10/2001 L foot surgery - 11/10/2001 R knee  arthroscopy 05/04 Thyroid uptake scan normal - 02/08/2002  Family History: Reviewed history from 01/22/2010 and no changes required. father - DM, HTN, CRF, COPD, mother - DM, HTN, PUD, MI 68s,  sisters and brothers - DM, HTN Leukemia - sister died at 52 yo Sister died of malnutrition **early FHx of CAD - diffuse family history ot MI in 79s. Family History of Colon Cancer:MGF Family History of Colon Polyps:MGF   Social History: Reviewed history from 10/24/2009 and no changes required. no tob/ETOH/drugs;  widow, 3 grown children Arnetha Rollo, Barbette Or); 7 grandchildren and 5 great grandchildren.    On disablility 12/04 for OA knees/spine.  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  Walks occasionally. Daily Caffeine Use3 per day Seat Belt Use:  always Does Patient Exercise:  no  Review of Systems       per HPI  Physical Exam  General:  obese, in no acute distress; alert,appropriate and cooperative throughout examination. VS noted - BP a little higher than normal for her but reports eating watermelon with salt Lungs:  Normal respiratory effort, chest expands symmetrically. Lungs are clear to auscultation, no crackles or wheezes. winces with taking deep breath Heart:  Normal rate and regular rhythm. S1 and S2 normal without gallop, murmur, click, rub or other extra sounds. Abdomen:  morbidly obese.  soft.  nondistended.  normal bowel sounds.   Msk:  t-spine--mild ttp over right posterior rib cage.  no bruising or deformity.    Extremities:  no edema. 2+ pulses   Impression & Recommendations:  Problem # 1:  DIABETES MELLITUS, TYPE II, CONTROLLED, MILD (ICD-250.00) Assessment Unchanged  doing well. check A1C today but I expect it will be good. f/u 3 months  Her updated medication list for this problem includes:    Aspir-81 81 Mg Tbec (Aspirin) .[name]... Take 1 tablet by mouth every morning    Diovan Hct 320-25 Mg Tabs  (Valsartan-hydrochlorothiazide) .[name]... 1 by mouth once daily for blood pressure    Metformin Hcl 500 Mg Tabs (Metformin hcl) .[name]... 1 po two times a day for diabetes  Orders: Comp Met-[facility] [id number]) A1C-[facility] [id number]) [facility]- Est  Level 4 [id number])  Labs Reviewed: Creat: 1.43 (11/12/2009)     Last Eye Exam: normal (11/14/2008) Reviewed HgBA1c results: 6.7 (11/12/2009)  6.3 (08/08/2009)  Problem # 2:  HYPERTENSION, BENIGN ESSENTIAL (ICD-401.1) Assessment: Unchanged  overall doing well.  avoid salt.  Her updated medication list for this problem includes:    Diovan Hct 320-25 Mg Tabs (Valsartan-hydrochlorothiazide) .[name]... 1 by mouth once daily for blood pressure    Metoprolol Tartrate 50 Mg Tabs (Metoprolol tartrate) .[name]... Take 1 tablet by mouth twice a day  BP today: 140/68 Prior BP: 147/71 (11/29/2009)  Labs Reviewed: K+: 4.4 (11/12/2009) Creat: : 1.43 (11/12/2009)   Chol: 176 (11/12/2009)   HDL: 56 (11/12/2009)   LDL: 108 (11/12/2009)   TG: 61 (11/12/2009)  Orders: [facility]- Est  Level 4 ([id number])  Problem # 3:  BACK PAIN, THORACIC REGION (ICD-724.1) Assessment: Unchanged  agree with dr Micheline Chapman.  continue heat as needed  Her updated medication list for this problem includes:    Aspir-81 81 Mg Tbec (Aspirin) .[name]... Take 1 tablet by mouth every morning  Orders: [facility]- Est  Level 4 [id number])  Problem # 4:  Hx of RENAL FAILURE (ICD-586) Assessment: Unchanged  check labs today and fax to dr Lorrene Reid at [facility]  Orders: [facility]- Est  Level 4 [id number])  Complete Medication List: 1)  Aspir-81 81 Mg Tbec (Aspirin) .... Take 1 tablet by mouth every morning 2)  Diovan Hct 320-25 Mg Tabs (Valsartan-hydrochlorothiazide) .[name].. 1 by mouth once daily for blood pressure 3)  Metformin Hcl 500 Mg Tabs (Metformin hcl) .[name].. 1 po two times a day for diabetes 4)  Metoprolol Tartrate 50 Mg Tabs (Metoprolol tartrate) .... Take 1 tablet by mouth twice a day 5)  Prilosec 20 Mg Cpdr (Omeprazole) ....  Take 1 capsule by mouth once a day 6)  Ferrous Sulfate 325 (65 Fe) Mg Tabs (Ferrous sulfate) .[name].. 1 by mouth once daily for anemia. 7)  Contour Blood Glucose System W/device Kit (Blood glucose monitoring suppl) .... For checking sugars two times a day 8)  Bayer Contour Test Strp (Glucose blood) .... For checking sugars two times a day; disp qs for one month 9)  Calcium Plus Vitamin D 500-50 Mg-unit Caps (Calcium carbonate-vitamin d) .[name].. 1 capsule by mouth once daily 10)  Ra Col-rite 250 Mg Caps (Docusate sodium) .[name].. 1 capsule by mouth once daily  Other Orders: CBC w/Diff-[facility] [id number])  Patient Instructions: 1)  Please follow up in 3 months or of course sooner if needed. 2)  Watch out for that watermelon with salt!  It will increase your blood pressure! 3)  It was nice to see you today and it has been a pleasure taking care of you!  Prevention & Chronic Care Immunizations   Influenza vaccine: Fluvax 3+  (08/08/2009)   Influenza vaccine deferral: Not available  (04/06/2009)   Influenza vaccine due: 06/05/2009    Tetanus booster: 02/08/2002: Done.   Tetanus booster due: 02/09/2012    Pneumococcal vaccine: Not documented    H. zoster vaccine: Not documented  Colorectal Screening   Hemoccult: Done.  (04/11/2002)   Hemoccult due: Not Indicated    Colonoscopy: DONE  (11/06/2009)   Colonoscopy due: 12/2012  Other Screening   Pap smear: Done.  (08/11/2006)   Pap smear due: 08/2009    Mammogram: Normal  (10/06/2009)   Mammogram action/deferral: Not indicated  (10/02/2009)   Mammogram due: 10/2010    DXA bone density scan: Not documented   DXA bone density action/deferral: Deferred  (10/02/2009)   Smoking status: never  (02/04/2010)  Diabetes Mellitus   HgbA1C: 6.7  (11/12/2009)   Hemoglobin A1C due: 01/31/2009    Eye exam: normal  (11/14/2008)   Eye exam due: 11/14/2009    Foot exam: yes  (08/08/2009)   High risk foot: Not documented   Foot care education: Not  documented   Foot exam due: 10/31/2009    Urine microalbumin/creatinine ratio: Not documented   Urine microalbumin action/deferral: Not indicated    Diabetes flowsheet reviewed?: Yes   Progress toward A1C goal: At goal  Lipids   Total Cholesterol: 176  (11/12/2009)   LDL: 108  (11/12/2009)   LDL Direct: 107  (10/21/2007)   HDL: 56  (11/12/2009)   Triglycerides: 61  (11/12/2009)    SGOT (AST): 19  (11/12/2009)   SGPT (ALT): 11  (11/12/2009) CMP  ordered    Alkaline phosphatase: 63  (11/12/2009)   Total bilirubin: 0.4  (11/12/2009)    Lipid flowsheet reviewed?: Yes   Progress toward LDL goal: Unchanged  Hypertension   Last Blood Pressure: 140 / 68  (02/04/2010)   Serum creatinine: 1.43  (11/12/2009)   Serum potassium 4.4  (11/12/2009) CMP ordered     Hypertension flowsheet reviewed?: Yes   Progress toward BP goal: Improved  Self-Management Support :   Personal Goals (by the next clinic visit) :     Personal A1C goal: 8  (08/08/2009)     Personal blood pressure goal: 130/80  (04/06/2009)     Personal LDL goal: 100  (04/06/2009)    Diabetes self-management support: Written self-care plan  (10/02/2009)    Diabetes self-management support not done because: Good outcomes  (08/08/2009)    Hypertension self-management support: BP self-monitoring log, Written self-care plan  (10/18/2009)    Hypertension self-management support not done because: Good outcomes  (02/04/2010)    Lipid self-management support: Lipid monitoring log, Written self-care plan  (10/18/2009)     Lipid self-management support not done because: Good outcomes  (02/04/2010)  Appended Document: Lab Order    Lab Visit  Laboratory Results   Blood Tests   Date/Time Received: February 04, 2010 9:03 AM  Date/Time Reported: February 04, 2010 9:29 AM   HGBA1C: 6.4%   (Normal Range: Non-Diabetic - 3-6%   Control Diabetic - 6-8%)  Comments: test performed by Audelia Hives CMA    Orders Today:

## 2010-09-10 NOTE — Progress Notes (Signed)
Summary: Ref Msg  Phone Note Call from Patient   Caller: Patient Summary of Call: Pt asking that her referral to the kidney center not be on a wednesday.  Would like early mornings and also please call her cell phone [phone]. Initial call taken by: Raymond Gurney,  November 02, 2009 8:38 AM  Follow-up for Phone Call        spoke with patient and this request is noted. Follow-up by: Marcell Barlow RN,  November 02, 2009 9:14 AM

## 2010-09-10 NOTE — Progress Notes (Signed)
Summary: Rx Req  Phone Note Refill Request Call back at [phone] Message from:  Patient  Needs a rx for Contour machine and the test strips to go with this.  Pt uses [facility] Fax# [phone].  Initial call taken by: Raymond Gurney,  October 10, 2009 3:48 PM  Follow-up for Phone Call        will forward to MD. Follow-up by: Marcell Barlow RN,  October 10, 2009 4:01 PM    New/Updated Medications: CONTOUR BLOOD GLUCOSE SYSTEM W/DEVICE KIT (BLOOD GLUCOSE MONITORING SUPPL) for checking sugars once daily BAYER CONTOUR TEST  STRP (GLUCOSE BLOOD) for checking sugars once daily Prescriptions: BAYER CONTOUR TEST  STRP (GLUCOSE BLOOD) for checking sugars once daily  #100 x 11   Entered and Authorized by:   Eugenie Norrie  MD   Signed by:   Eugenie Norrie  MD on 10/11/2009   Method used:   Faxed to ...       [facility] (retail)             , [place]         Ph:        Fax: [phone]   RxID:   [id number] CONTOUR BLOOD GLUCOSE SYSTEM W/DEVICE KIT (BLOOD GLUCOSE MONITORING SUPPL) for checking sugars once daily  #1 x 0   Entered and Authorized by:   Eugenie Norrie  MD   Signed by:   Eugenie Norrie  MD on 10/11/2009   Method used:   Faxed to ...       [facility] (retail)             , [place]         Ph:        Fax: [phone]   RxID:   [id number]

## 2010-09-10 NOTE — Miscellaneous (Signed)
Summary: patient summary  Clinical Lists Changes  Problems: Changed problem from RENAL FAILURE (ICD-586) to History of  RENAL FAILURE (ICD-586) Observations: Added new observation of FAMILY HX: father - DM, HTN, CRF, COPD, mother - DM, HTN, PUD, MI 30s,  sisters and brothers - DM, HTN Leukemia - sister died at 63 yo Sister died of malnutrition **early FHx of CAD - diffuse family history ot MI in 89s. Family History of Colon Cancer:MGF Family History of Colon Polyps:MGF   (01/22/2010 12:02) Added new observation of PAST MED HX: H/O Redux use,  Recurrent vulvovaginitis GERD DM2.  stress echo normal 02/08 Adenomatous Colon Polyps h/o acute renal failure - seen by [facility].  - closely monitoring her cbcs, renal functions, rheumatologic tests.  BACK PAIN, THORACIC REGION (ICD-724.1) POLYMYALGIA RHEUMATICA (ICD-725) - SHOULDER PAIN, BILATERAL (ICD-719.41) UNSPECIFIED IRON DEFICIENCY ANEMIA (ICD-280.9) - PICA (ICD-307.52) ANXIETY, SITUATIONAL (ICD-308.3) HYPERLIPIDEMIA (ICD-272.4) DIABETES MELLITUS, TYPE II, CONTROLLED, MILD (ICD-250.00) GOITER, UNSPECIFIED (ICD-240.9) - hyperthryroidism resolved 06/04-PTU d/c`d OSTEOARTHRITIS, MULTI SITES (ICD-715.98)     BACK PAIN W/RADIATION, UNSPECIFIED (ICD-724.4) OBESITY, NOS (ICD-278.00) HYPERTENSION, BENIGN ESSENTIAL (ICD-401.1)   (01/22/2010 12:02)      Past History:  Past Medical History: H/O Redux use,  Recurrent vulvovaginitis GERD DM2.  stress echo normal 02/08 Adenomatous Colon Polyps h/o acute renal failure - seen by [facility].  - closely monitoring her cbcs, renal functions, rheumatologic tests.  BACK PAIN, THORACIC REGION (ICD-724.1) POLYMYALGIA RHEUMATICA (ICD-725) - SHOULDER PAIN, BILATERAL (ICD-719.41) UNSPECIFIED IRON DEFICIENCY ANEMIA (ICD-280.9) - PICA (ICD-307.52) ANXIETY, SITUATIONAL (ICD-308.3) HYPERLIPIDEMIA (ICD-272.4) DIABETES MELLITUS, TYPE II, CONTROLLED, MILD (ICD-250.00) GOITER,  UNSPECIFIED (ICD-240.9) - hyperthryroidism resolved 06/04-PTU d/c`d OSTEOARTHRITIS, MULTI SITES (ICD-715.98)     BACK PAIN W/RADIATION, UNSPECIFIED (ICD-724.4) OBESITY, NOS (ICD-278.00) HYPERTENSION, BENIGN ESSENTIAL (ICD-401.1)  Family History: father - DM, HTN, CRF, COPD, mother - DM, HTN, PUD, MI 53s,  sisters and brothers - DM, HTN Leukemia - sister died at 69 yo Sister died of malnutrition **early FHx of CAD - diffuse family history ot MI in 19s. Family History of Colon Cancer:MGF Family History of Colon Polyps:MGF

## 2010-09-10 NOTE — Assessment & Plan Note (Signed)
Summary: FU/KH   Vital Signs:  Patient profile:   65 year old female Height:      69 inches Weight:      300.6 pounds BMI:     44.55 Temp:     98.7 degrees F oral Pulse rate:   69 / minute BP sitting:   121 / 76  (right arm) Cuff size:   regular  Vitals Entered By: Levert Feinstein LPN (April  4, 624THL [id number] AM) CC: f/u dm, ARF, HTN, anemia Is Patient Diabetic? Yes Did you bring your meter with you today? No Pain Assessment Patient in pain? yes     Location: back   Primary Care Provider:  Patria Mane  MD  CC:  f/u dm, ARF, HTN, and anemia.  History of Present Illness: DM: overall doing well.  checking sugars and typically in AMs are around 100 and in PM around 140.  not having problems with medications. denies any low sugar episodes.  not having gi upset or muscle aches with meds.  ARF: continues to urinate regularly.  wants to review all of what has happened so far with blood work, urine tests and ultrasounds.  stopped diclofenac as recommended. awaiting appt with [facility]  HTN: taking med as prescribed.  denies dizziness, lightheadedness, chest pain, shortness of breath or change in leg swelling  anemia: taking iron as prescribed.  requiring her to take stool softener with it.  still hasn't noticed any bleeding from anywhere - no periods, no gi bleed.  recent colonoscopy good.   Habits & Providers  Alcohol-Tobacco-Diet     Tobacco Status: never  Current Medications (verified): 1)  Aspir-81 81 Mg Tbec (Aspirin) .... Take 1 Tablet By Mouth Every Morning 2)  Diovan Hct 320-25 Mg Tabs (Valsartan-Hydrochlorothiazide) .[name].. 1 By Mouth Once Daily For Blood Pressure 3)  Metformin Hcl 500 Mg Tabs (Metformin Hcl) .[name].. 1 Po Two Times A Day For Diabetes 4)  Metoprolol Tartrate 50 Mg Tabs (Metoprolol Tartrate) .... Take 1 Tablet By Mouth Twice A Day 5)  Prilosec 20 Mg Cpdr (Omeprazole) .... Take 1 Capsule By Mouth Once A Day 6)  Prednisone 10 Mg Tabs (Prednisone)  .[name].. 1 Tab By Mouth Once Daily For Shoulder Pain. 7)  Ferrous Sulfate 325 (65 Fe) Mg Tabs (Ferrous Sulfate) .[name].. 1 By Mouth Once Daily For Anemia. 8)  Contour Blood Glucose System W/device Kit (Blood Glucose Monitoring Suppl) .... For Checking Sugars Two Times A Day 9)  Bayer Contour Test  Strp (Glucose Blood) .... For Checking Sugars Two Times A Day; Disp Qs For One Month 10)  Calcium Plus Vitamin D 500-50 Mg-Unit Caps (Calcium Carbonate-Vitamin D) .[name].. 1 Capsule By Mouth Once Daily 11)  Ra Col-Rite 250 Mg Caps (Docusate Sodium) .[name].. 1 Capsule By Mouth Once Daily 12)  Moviprep 100 Gm  Solr (Peg-Kcl-Nacl-Nasulf-Na Asc-C) .... As Per Prep Instructions.  Allergies (verified): 1)  Lisinopril (Lisinopril)  Past History:  PMH reviewed- no thanges required  Review of Systems       per HPI  Physical Exam  General:  obese, in no acute distress; alert,appropriate and cooperative throughout examination Lungs:  Normal respiratory effort, chest expands symmetrically. Lungs are clear to auscultation, no crackles or wheezes. Heart:  Normal rate and regular rhythm. S1 and S2 normal without gallop, murmur, click, rub or other extra sounds.   Impression & Recommendations:  Problem # 1:  DIABETES MELLITUS, TYPE II, CONTROLLED, MILD (ICD-250.00) Assessment Unchanged at goal.  no changes.  GFR 47 so okay at this time to continue metformin though may have to decrease if renal function worsens    Her updated medication list for this problem includes:    Aspir-81 81 Mg Tbec (Aspirin) .[name]... Take 1 tablet by mouth every morning    Diovan Hct 320-25 Mg Tabs (Valsartan-hydrochlorothiazide) .[name]... 1 by mouth once daily for blood pressure    Metformin Hcl 500 Mg Tabs (Metformin hcl) .[name]... 1 po two times a day for diabetes  Orders: A1C-[facility] [id number]) Lipid-[facility] [id number]) TSH-[facility] [phone]) [place]- Est  Level 4 [id number])  Labs Reviewed: Creat: 1.45 (10/02/2009)     Last Eye Exam: normal  (11/14/2008) Reviewed HgBA1c results: 6.7 (11/12/2009)  6.3 (08/08/2009)  Problem # 2:  RENAL FAILURE (ICD-586) Assessment: Unchanged  recheck today.  appt tomorrow with [facility].  likely multifactorial. extensively went over all of her tests and studies thus far.   Orders: Comp Met-[facility] [phone]) [facility]- Est  Level 4 [id number])  Labs Reviewed: BUN: 22 (10/02/2009)   Cr: 1.45 (10/02/2009)    Hgb: 10.3 (10/02/2009)   Hct: 33.6 (10/02/2009)   Ca++: 9.7 (10/02/2009)    TP: 8.5 (10/02/2009)   Alb: 4.2 (10/02/2009)  Problem # 3:  UNSPECIFIED IRON DEFICIENCY ANEMIA (ICD-280.9) Assessment: Unchanged continue iron.  check response today with CBC.  ? if partially renal etiology.  colonoscopy normal.   Her updated medication list for this problem includes:    Ferrous Sulfate 325 (65 Fe) Mg Tabs (Ferrous sulfate) .[name]... 1 by mouth once daily for anemia.  Orders: CBC-[facility] [id number]) [place]- Est  Level 4 [id number])  Problem # 4:  HYPERTENSION, BENIGN ESSENTIAL (ICD-401.1) Assessment: Unchanged  at goal  Her updated medication list for this problem includes:    Diovan Hct 320-25 Mg Tabs (Valsartan-hydrochlorothiazide) .[name]... 1 by mouth once daily for blood pressure    Metoprolol Tartrate 50 Mg Tabs (Metoprolol tartrate) .[name]... Take 1 tablet by mouth twice a day  BP today: 121/76 Prior BP: 134/82 (10/24/2009)  Labs Reviewed: K+: 4.0 (10/02/2009) Creat: : 1.45 (10/02/2009)   Chol: 187 (10/31/2008)   HDL: 63 (10/31/2008)   LDL: 113 (10/31/2008)   TG: 53 (10/31/2008)  Orders: [place]- Est  Level 4 [id number])  Complete Medication List: 1)  Aspir-81 81 Mg Tbec (Aspirin) .... Take 1 tablet by mouth every morning 2)  Diovan Hct 320-25 Mg Tabs (Valsartan-hydrochlorothiazide) .[name].. 1 by mouth once daily for blood pressure 3)  Metformin Hcl 500 Mg Tabs (Metformin hcl) .[name].. 1 po two times a day for diabetes 4)  Metoprolol Tartrate 50 Mg Tabs (Metoprolol tartrate) .... Take 1 tablet by mouth twice a day 5)   Prilosec 20 Mg Cpdr (Omeprazole) .... Take 1 capsule by mouth once a day 6)  Prednisone 10 Mg Tabs (Prednisone) .[name].. 1 tab by mouth once daily for shoulder pain. 7)  Ferrous Sulfate 325 (65 Fe) Mg Tabs (Ferrous sulfate) .[name].. 1 by mouth once daily for anemia. 8)  Contour Blood Glucose System W/device Kit (Blood glucose monitoring suppl) .... For checking sugars two times a day 9)  Bayer Contour Test Strp (Glucose blood) .... For checking sugars two times a day; disp qs for one month 10)  Calcium Plus Vitamin D 500-50 Mg-unit Caps (Calcium carbonate-vitamin d) .[name].. 1 capsule by mouth once daily 11)  Ra Col-rite 250 Mg Caps (Docusate sodium) .[name].. 1 capsule by mouth once daily 12)  Moviprep 100 Gm Solr (Peg-kcl-nacl-nasulf-na asc-c) .... As per prep instructions.  Patient Instructions: 1)  Please follow  up with me in 3 months to recheck your blood pressure, diabetes, etc.  2)  I'll let you know if there is anything we need to worry about with your blood work today. 3)  Its okay to take tylenol (acetaminophen) as needed for back pain.   Laboratory Results   Blood Tests   Date/Time Received: November 12, 2009 9:12 AM  Date/Time Reported: November 12, 2009 9:26 AM   HGBA1C: 6.7%   (Normal Range: Non-Diabetic - 3-6%   Control Diabetic - 6-8%)  Comments: ...............test performed by......Marland KitchenBonnie A. [name], MLS (ASCP)cm

## 2010-09-10 NOTE — Assessment & Plan Note (Signed)
Summary: f/u back pain/[place]   Vital Signs:  Patient profile:   65 year old female Height:      69 inches Weight:      302.8 pounds BMI:     44.88 Temp:     98.8 degrees F oral Pulse rate:   69 / minute BP sitting:   147 / 71  (left arm) Cuff size:   regular  Vitals Entered By: Levert Feinstein LPN (April 21, 624THL 624THL PM) CC: f/u back pain Is Patient Diabetic? Yes Did you bring your meter with you today? No Pain Assessment Patient in pain? no        Primary Care Provider:  Patria Mane  MD  CC:  f/u back pain.  History of Present Illness: back pain has been intermittent for months but has become more noticable over past few weeks.  it is depressing her that she doesn't know what is going on and she is worried about it.  states she feels a stabbing pain that starts under her right ribs and goes back towards her shoulder blade.  worsened with deep breath but does come sometimes without having to take a breath.  lasts seconds to minutes when it occurs.  she cannot trace any certain foods to the pain at this time.  she is taking her omeprazole as prescribed as well.  she states prednisone, ibuprofen, aleve no help (hasn't been on these for a few months now) and tylenol doesn't help either when it comes.  resolves on it's own.  denies associated shortness of breath or chest pains  has been undergoing large workup for kidney disease and serologic markers.  renal ultrasound reviewed from 10/22/09 which shows incidental gallstones but did not comment on the gallbladder architecture itself.  all Cmets have shown LFTS that were WNL.    Habits & Providers  Alcohol-Tobacco-Diet     Tobacco Status: never  Current Medications (verified): 1)  Aspir-81 81 Mg Tbec (Aspirin) .... Take 1 Tablet By Mouth Every Morning 2)  Diovan Hct 320-25 Mg Tabs (Valsartan-Hydrochlorothiazide) .[name].. 1 By Mouth Once Daily For Blood Pressure 3)  Metformin Hcl 500 Mg Tabs (Metformin Hcl) .[name].. 1 Po Two Times A Day For  Diabetes 4)  Metoprolol Tartrate 50 Mg Tabs (Metoprolol Tartrate) .... Take 1 Tablet By Mouth Twice A Day 5)  Prilosec 20 Mg Cpdr (Omeprazole) .... Take 1 Capsule By Mouth Once A Day 6)  Prednisone 10 Mg Tabs (Prednisone) .[name].. 1 Tab By Mouth Once Daily For Shoulder Pain. 7)  Ferrous Sulfate 325 (65 Fe) Mg Tabs (Ferrous Sulfate) .[name].. 1 By Mouth Once Daily For Anemia. 8)  Contour Blood Glucose System W/device Kit (Blood Glucose Monitoring Suppl) .... For Checking Sugars Two Times A Day 9)  Bayer Contour Test  Strp (Glucose Blood) .... For Checking Sugars Two Times A Day; Disp Qs For One Month 10)  Calcium Plus Vitamin D 500-50 Mg-Unit Caps (Calcium Carbonate-Vitamin D) .[name].. 1 Capsule By Mouth Once Daily 11)  Ra Col-Rite 250 Mg Caps (Docusate Sodium) .[name].. 1 Capsule By Mouth Once Daily  Allergies (verified): 1)  Lisinopril (Lisinopril)  Past History:  Past medical, surgical, family and social histories (including risk factors) reviewed for relevance to current acute and chronic problems.  Past Medical History: Reviewed history from 10/24/2009 and no changes required. H/O Redux use,  hyperthryroidism resolved 06/04-PTU d/c`d, -- goiter Recurrent vulvovaginitis HTN Obesity Paresthesias Osteoarthrtis/ DJD GERD Back pain DM2.  stress echo normal 02/08 Adenomatous Colon Polyps  Past Surgical History: Reviewed history from 10/31/2008 and no changes required. ABI=1.06 (normal) - 12/14/2001 carpel tunnel release - 11/10/2001 EGD nl 12/2002 - 01/18/2003 ECHO 1/08: mild, subtle inferior hypokinesis - 09/08/2006 Holter -symptomatic PVCs only 03/00 - 01/18/2003 Hysterectomy/BSO - Supracervical - 11/10/2001 L foot surgery - 11/10/2001 R knee arthroscopy 05/04 Thyroid uptake scan normal - 02/08/2002  Family History: Reviewed history from 10/24/2009 and no changes required. father - DM, HTN, CRF, COPD, motehr - DM, HTN, PUD, MI 59s,  sisters and brothers - DM, HTN Leukemia - sister died at 58  yo Sister died of malnutrition **early FHx of CAD - diffuse family history ot MI in 39s. Family History of Colon Cancer:MGF Family History of Colon Polyps:MGF   Social History: Reviewed history from 10/24/2009 and no changes required. no tob/ETOH/drugs;  widow, 3 grown children Asal Tignor, Barbette Or); 7 grandchildren and 5 great grandchildren.    On disablility 12/04 for OA knees/spine.  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  Walks occasionally. Daily Caffeine Use3 per day  Review of Systems       per HPI  Physical Exam  General:  obese, in no acute distress; alert,appropriate and cooperative throughout examination. VS noted Lungs:  Normal respiratory effort, chest expands symmetrically. Lungs are clear to auscultation, no crackles or wheezes. winces with taking deep breath Heart:  Normal rate and regular rhythm. S1 and S2 normal without gallop, murmur, click, rub or other extra sounds. Abdomen:  morbidly obese.  soft.  nondistended.  normal bowel sounds.  no masses.  no rebound or guarding.  mildly tender with deep palpation in RUQ that causes similar pain to that she describes.  exam somewhat limited by patient's habitus   Impression & Recommendations:  Problem # 1:  BACK PAIN, THORACIC REGION (ICD-724.1) Assessment Deteriorated i think this possibly could be related to gallstone disease.  unlikely to be something more serious (such as PNA, AAA, etc) as it has been present now for months with relatively little changes and blood work during this time has been primarily WNL. no recent travel or other risk factors to suggest PE (and again, not short of breath).  to help determine get U/S Abdomen but also CXR (since pain is pleuritic).  will call patient once results back to discuss where to go next with plan.  may need to call surgeons for referral if gallstone disease found.  Her updated medication list for this problem  includes:    Aspir-81 81 Mg Tbec (Aspirin) .[name]... Take 1 tablet by mouth every morning  Orders: CXR- 2view (CXR) Ultrasound (Ultrasound) [facility]- Est  Level 4 [id number])  Complete Medication List: 1)  Aspir-81 81 Mg Tbec (Aspirin) .... Take 1 tablet by mouth every morning 2)  Diovan Hct 320-25 Mg Tabs (Valsartan-hydrochlorothiazide) .[name].. 1 by mouth once daily for blood pressure 3)  Metformin Hcl 500 Mg Tabs (Metformin hcl) .[name].. 1 po two times a day for diabetes 4)  Metoprolol Tartrate 50 Mg Tabs (Metoprolol tartrate) .... Take 1 tablet by mouth twice a day 5)  Prilosec 20 Mg Cpdr (Omeprazole) .... Take 1 capsule by mouth once a day 6)  Prednisone 10 Mg Tabs (Prednisone) .[name].. 1 tab by mouth once daily for shoulder pain. 7)  Ferrous Sulfate 325 (65 Fe) Mg Tabs (Ferrous sulfate) .[name].. 1 by mouth once daily for anemia. 8)  Contour Blood Glucose System W/device Kit (Blood glucose monitoring suppl) .... For checking sugars two times a  day 9)  Bayer Contour Test Strp (Glucose blood) .... For checking sugars two times a day; disp qs for one month 10)  Calcium Plus Vitamin D 500-50 Mg-unit Caps (Calcium carbonate-vitamin d) .[name].. 1 capsule by mouth once daily 11)  Ra Col-rite 250 Mg Caps (Docusate sodium) .[name].. 1 capsule by mouth once daily  Patient Instructions: 1)  I think most likely your pain is from your gallbladder.  to help [name] figure this out we are going to get an ultrasound to look specifically at that and also a chest xray to be certain the lungs look okay. Once I get all of the results I will call you and we can discuss where we go next. 2)  continue tylenol for now, watch to see if certain foods make it worse or not.

## 2010-09-10 NOTE — Consult Note (Signed)
Summary: [facility]   Imported By: Raymond Gurney 11/22/2009 08:59:22  _____________________________________________________________________  External Attachment:    Type:   Image     Comment:   External Document  Appended Document: [facility] also noted on this lab work a mild anemia, mild leukopenia.  will need monitoring over time.  now ANCA negative (? 2/2 prednisone use for PMR) but ANA still 1:160.  Discussed with Dr Lorrene Reid who plans to watch  until summer, repea labs and depending on results consider renal biopsy at that time.

## 2010-09-10 NOTE — Progress Notes (Signed)
Summary: triage  Phone Note Call from Patient Call back at Work Phone [phone]   Caller: Patient Summary of Call: pt is requesting to talk to Dr [name] about her back - concerned about it not being arthritis - feels like a stabbing pain near her shoulder blade when she takes a deep breath - thinks she needs MRI Initial call taken by: Audie Clear,  November 26, 2009 3:07 PM  Follow-up for Phone Call        only happens when she takes a deep breath. hot tub or shower helps while she is in the water. comes back when she gets out. tylenol does nothing for her & cannot use NSAIDS due to her kidneys. told her I will send pcp this message & will call her tomorrow with the response. Follow-up by: Elige Radon RN,  November 26, 2009 3:27 PM  Additional Follow-up for Phone Call Additional follow up Details #1::        she should be examined in clinic to hear the whole story, do an examination and possibly order some labs/studies Additional Follow-up by: Patria Mane  MD,  November 26, 2009 9:22 PM    Additional Follow-up for Phone Call Additional follow up Details #2::    she will see her pcp thursday. had other things going on & cannot come before that Follow-up by: Elige Radon RN,  November 27, 2009 8:36 AM

## 2010-09-10 NOTE — Assessment & Plan Note (Signed)
Summary: iron def. anemia...em   History of Present Illness Visit Type: Initial Consult Primary GI MD: Erskine Emery MD [facility] Primary Provider: Patria Mane  MD Requesting Provider: Madison Hickman, MD Chief Complaint: iron def. anemia History of Present Illness:   Ms. Balderaz is a 65 year old Afro-American female referred at the request of Dr. Andria Frames for evaluation of anemia.  She had been taking diclofenac which was discontinued 2 months ago.  Prior to that she was on ibuprofen.  She has been taking supplementary iron.  Hemoglobin from October 02, 2009 was 10.3.  She has no GI complaints including change of bowel habits, abdominal pain, melena or hematochezia.  The patient has a history of colon polyps and was last examined approximately 8 years ago.  She is unsure of the type of polyp.  The patient has non-insulin-dependent diabetes mellitus and renal insufficiency.   GI Review of Systems    Reports acid reflux.      Denies abdominal pain, belching, bloating, chest pain, dysphagia with liquids, dysphagia with solids, heartburn, loss of appetite, nausea, vomiting, vomiting blood, weight loss, and  weight gain.      Reports change in bowel habits, constipation, and  diarrhea.     Denies anal fissure, black tarry stools, diverticulosis, fecal incontinence, heme positive stool, hemorrhoids, irritable bowel syndrome, jaundice, light color stool, liver problems, rectal bleeding, and  rectal pain.    Current Medications (verified): 1)  Aspir-81 81 Mg Tbec (Aspirin) .... Take 1 Tablet By Mouth Every Morning 2)  Diovan Hct 320-25 Mg Tabs (Valsartan-Hydrochlorothiazide) .[name].. 1 By Mouth Once Daily For Blood Pressure 3)  Metformin Hcl 500 Mg Tabs (Metformin Hcl) .[name].. 1 Po Two Times A Day For Diabetes 4)  Metoprolol Tartrate 50 Mg Tabs (Metoprolol Tartrate) .... Take 1 Tablet By Mouth Twice A Day 5)  Prilosec 20 Mg Cpdr (Omeprazole) .... Take 1 Capsule By Mouth Once A Day 6)  Prednisone 10 Mg Tabs  (Prednisone) .[name].. 1 Tab By Mouth Once Daily For Shoulder Pain. 7)  Ferrous Sulfate 325 (65 Fe) Mg Tabs (Ferrous Sulfate) .[name].. 1 By Mouth Once Daily For Anemia. 8)  Contour Blood Glucose System W/device Kit (Blood Glucose Monitoring Suppl) .... For Checking Sugars Two Times A Day 9)  Bayer Contour Test  Strp (Glucose Blood) .... For Checking Sugars Two Times A Day; Disp Qs For One Month 10)  Calcium Plus Vitamin D 500-50 Mg-Unit Caps (Calcium Carbonate-Vitamin D) .[name].. 1 Capsule By Mouth Once Daily 11)  Ra Col-Rite 250 Mg Caps (Docusate Sodium) .[name].. 1 Capsule By Mouth Once Daily  Allergies (verified): 1)  Lisinopril (Lisinopril)  Past History:  Past Medical History: H/O Redux use,  hyperthryroidism resolved 06/04-PTU d/c`d, -- goiter Recurrent vulvovaginitis HTN Obesity Paresthesias Osteoarthrtis/ DJD GERD Back pain DM2.  stress echo normal 02/08 Adenomatous Colon Polyps  Past Surgical History: Reviewed history from 10/31/2008 and no changes required. ABI=1.06 (normal) - 12/14/2001 carpel tunnel release - 11/10/2001 EGD nl 12/2002 - 01/18/2003 ECHO 1/08: mild, subtle inferior hypokinesis - 09/08/2006 Holter -symptomatic PVCs only 03/00 - 01/18/2003 Hysterectomy/BSO - Supracervical - 11/10/2001 L foot surgery - 11/10/2001 R knee arthroscopy 05/04 Thyroid uptake scan normal - 02/08/2002  Family History: father - DM, HTN, CRF, COPD, motehr - DM, HTN, PUD, MI 68s,  sisters and brothers - DM, HTN Leukemia - sister died at 34 yo Sister died of malnutrition **early FHx of CAD - diffuse family history ot MI in 41s. Family History of Colon Cancer:MGF Family History of  Colon Polyps:MGF   Social History: Reviewed history from 10/21/2007 and no changes required. no tob/ETOH/drugs;  widow, 3 grown children Annastazia Mcmurphy, Barbette Or); 7 grandchildren and 5 great grandchildren.    On disablility 12/04 for OA knees/spine.  Worked in Production designer, theatre/television/film.  Volunteers at  non-denominal chruch 3x/week.  Walks occasionally. Daily Caffeine Use3 per day  Review of Systems       The patient complains of arthritis/joint pain, back pain, and change in vision.  The patient denies allergy/sinus, anemia, anxiety-new, blood in urine, breast changes/lumps, confusion, cough, coughing up blood, depression-new, fainting, fatigue, fever, headaches-new, hearing problems, heart murmur, heart rhythm changes, itching, muscle pains/cramps, night sweats, nosebleeds, shortness of breath, skin rash, sleeping problems, sore throat, swelling of feet/legs, swollen lymph glands, thirst - excessive, urination - excessive, urination changes/pain, urine leakage, vision changes, and voice change.         All other systems were reviewed and were negative   Vital Signs:  Patient profile:   65 year old female Height:      69 inches Weight:      302.25 pounds BMI:     44.80 BSA:     2.46 Pulse rate:   76 / minute Pulse rhythm:   regular BP sitting:   134 / 82  (left arm) Cuff size:   regular  Vitals Entered By: Randye Lobo NCMA (October 24, 2009 9:55 AM)  Physical Exam  Additional Exam:  On physical exam she is an obese female  skin: anicteric HEENT: normocephalic; PEERLA; no nasal or pharyngeal abnormalities neck: supple nodes: no cervical lymphadenopathy chest: clear to ausculatation and percussion heart: no murmurs, gallops, or rubs abd: soft, nontender; BS normoactive; no abdominal masses, tenderness, organomegaly rectal: deferred ext: no cynanosis, clubbing, edema skeletal: no deformities neuro: oriented x 3; no focal abnormalities    Impression & Recommendations:  Problem # 1:  UNSPECIFIED IRON DEFICIENCY ANEMIA (ICD-280.9)  History of iron deficiency anemia could be NSAID-related.  Other sources of chronic GI blood loss including polyps AVMs or neoplasm are considerations.  Recommendations  #1 serial Hemoccults #2 colonoscopy  Risks, alternatives, and  complications of the procedure, including bleeding, perforation, and possible need for surgery, were explained to the patient.  Patient's questions were answered.  Orders: ZCOL (ZCOL)  Problem # 2:  PERSONAL HISTORY OF COLONIC POLYPS (ICD-V12.72)  Followup colonoscopy  Orders: ZCOL (ZCOL)  Problem # 3:  RENAL FAILURE (ICD-586) Assessment: Comment Only  Problem # 4:  DIABETES MELLITUS, TYPE II, CONTROLLED, MILD (ICD-250.00) Assessment: Comment Only  Patient Instructions: 1)  Constipation and Hemorrhoids brochure given.  2)  Colonoscopy and Flexible Sigmoidoscopy brochure given.  3)  Your colonoscopy is scheduled for 11/06/2009 at 9:30 at [facility], Per your request 4)  We are calling in your MoviPrep to your pharmacy today 5)  You need to go to the basement to pick up your hemocclt kit today 6)  The medication list was reviewed and reconciled.  All changed / newly prescribed medications were explained.  A complete medication list was provided to the patient / caregiver. Prescriptions: MOVIPREP 100 GM  SOLR (PEG-KCL-NACL-NASULF-NA ASC-C) As per prep instructions.  #1 x 0   Entered by:   Genella Mech CMA (AAMA)   Authorized by:   Inda Castle MD   Signed by:   Genella Mech CMA ([place]) on 10/24/2009   Method used:   Electronically to        [facility] ([facility])       [place]       [place], [place]  [id number]       Ph: [phone]       Fax: [phone]   RxID:   [id number]

## 2010-09-10 NOTE — Assessment & Plan Note (Signed)
Summary: right side/back not any better,df   Vital Signs:  Patient profile:   65 year old female Height:      69 inches Weight:      316.4 pounds BMI:     46.89 Temp:     98.4 degrees F oral Pulse rate:   103 / minute BP sitting:   141 / 81  (right arm) Cuff size:   large  Vitals Entered By: Levert Feinstein LPN (April 29, 624THL D34-534 PM) CC: back pain Pain Assessment Patient in pain? yes     Location: back pain Intensity: 8   History of Present Illness: 65yr old obese AAF presents with:  1) R shoulder blade pain - Has been intermittent for the last year.  No specific injury.  Will flare and then recover completely after course of Flexeril. Has been on flexeril and diclofenac x 1 month without much relief.  Pain is sharp and catches her. Positionally worsening. Cannot take a deep breath without pain.  No arm numbness or weakness.  Does also report shoulder tenderness bilaterally but no neck pain. No SOB or h/o swollen legs. No abd pain or n/v/d.  Habits & Providers     Tobacco Status: never  Allergies (verified): 1)  Lisinopril (Lisinopril)  Physical Exam  General:  morbidly obese pleasant AAF, NAD Msk:  B shoulders with diffuse tenderness but no point tenderness. symmetric and no effusions or warmth. Mild ttp over R scalupar muscles. No CVA tenderness.  ROM abduction and forward flexion only 90degrees bilaterally actively but 120 passively. Negative spurlings  R shoulder:  - limited internal ROM on R due to pain, normal extrenal ROM R - no scapular winging with pushing - normal sensation in r arm - positive empty can - pain with internal rotation and abduction against resistance - 2+ reflexes  2+ Radial pulses bilatearlly.    Impression & Recommendations:  Problem # 1:  SHOULDER PAIN, RIGHT (ICD-719.41) Assessment Unchanged Concern for rotator cuff impingement with muscle spasm but pt also with h/o OA so will start with plain films. Precepted with Dr Lindell Noe.  Pending  plain films, likely referral to sports med for further eval. Flexeril, stretches, and heat for comfort. Limited use currently.   Her updated medication list for this problem includes:    Aspir-81 81 Mg Tbec (Aspirin) .[name]... Take 1 tablet by mouth every morning    Diclofenac Sodium 75 Mg Tbec (Diclofenac sodium) .[name]... 1 by mouth two times a day for pain    Cyclobenzaprine Hcl 10 Mg Tabs (Cyclobenzaprine hcl) .[name]... 1/2 - 1 tablets 1-2 times daily as needed for muscle spasm  Orders: Diagnostic X-Ray/Fluoroscopy (Diagnostic X-Ray/Flu) [facility]- Est  Level 4 [id number])  Complete Medication List: 1)  Aspir-81 81 Mg Tbec (Aspirin) .... Take 1 tablet by mouth every morning 2)  Diovan Hct 320-25 Mg Tabs (Valsartan-hydrochlorothiazide) .[name].. 1 by mouth once daily for blood pressure 3)  Metformin Hcl 500 Mg Tabs (Metformin hcl) .[name].. 1 po two times a day.  last refill before visit with primary doctor. 4)  Metoprolol Tartrate 50 Mg Tabs (Metoprolol tartrate) .... Take 1 tablet by mouth twice a day 5)  Prilosec 20 Mg Cpdr (Omeprazole) .... Take 1 capsule by mouth once a day 6)  Diclofenac Sodium 75 Mg Tbec (Diclofenac sodium) .[name].. 1 by mouth two times a day for pain 7)  Cyclobenzaprine Hcl 10 Mg Tabs (Cyclobenzaprine hcl) .... 1/2 - 1 tablets 1-2 times daily as needed for muscle spasm  Patient  Instructions: 1)  Xrays of your shoulder. 2)  I will call you at [phone] with results and the next step. 3)  Continue the Flexeril as needed. Also heat and gentle stretching. 4)  Don't do any major movements with that right arm for now like stirring big pots. 5)  It was nice to meet you.

## 2010-09-10 NOTE — Miscellaneous (Signed)
Summary: prevention update  Clinical Lists Changes  Problems: Added new problem of HYPERLIPIDEMIA (ICD-272.4) Observations: Added new observation of HGBA1CNXTDUE: 09/05/2008 (08/08/2008 11:04) Added new observation of FLUVAXDUE: 06/05/2009 (08/08/2008 11:04)     Last HGBA1C:  6.1 (06/05/2008 8:14:32 AM) HGBA1C Next Due:  3 mo

## 2010-09-10 NOTE — Progress Notes (Signed)
Summary: results  Phone Note Call from Patient Call back at Work Phone [phone]   Caller: Patient Summary of Call: pt would like lab results  Initial call taken by: Audie Clear,  February 08, 2010 8:57 AM  Follow-up for Phone Call        she wanted to know about iron & creatnine. told her iron slightly low. states she is taking her iron pills & feels fine. wanted to know if cr is going up or down. told her it was down from last time it was checked. she wants to know what to do about the iron. told her to keep taking the pills & I will send to pcp & will call her back with Md response Follow-up by: Elige Radon RN,  February 08, 2010 9:09 AM  Additional Follow-up for Phone Call Additional follow up Details #1::        agree. continue iron supplementation.  i am happy about improvement in kidney function.  Additional Follow-up by: Patria Mane  MD,  February 09, 2010 8:55 PM    Additional Follow-up for Phone Call Additional follow up Details #2::    gave her md response. advised stool softners or prune juice daily as needed Follow-up by: Elige Radon RN,  February 12, 2010 9:41 AM

## 2010-09-10 NOTE — Letter (Signed)
Summary: Results Letter  [facility]  [place], [place] [address]   Phone: [phone]  Fax: [phone]        October 24, 2009 MRN: [id number]    [place], [place]  [address]    Dear Erin Coffey,  It is my pleasure to have treated you recently as a new patient in my office. I appreciate your confidence and the opportunity to participate in your care.  Since I do have a busy inpatient endoscopy schedule and office schedule, my office hours vary weekly. I am, however, available for emergency calls everyday through my office. If I am not available for an urgent office appointment, another one of our gastroenterologist will be able to assist you.  My well-trained staff are prepared to help you at all times. For emergencies after office hours, a physician from our Gastroenterology section is always available through my 24 hour answering service  Once again I welcome you as a new patient and I look forward to a happy and healthy relationship             Sincerely,  Inda Castle MD  This letter has been electronically signed by your physician.  Appended Document: Results Letter LETTER MAILED

## 2010-09-10 NOTE — Assessment & Plan Note (Signed)
Summary: cpe wp   Vital Signs:  Patient Profile:   65 Years Old Female Weight:      304.7 pounds Temp:     98.5 degrees F oral Pulse rate:   58 / minute BP sitting:   147 / 71  (left arm)  Pt. in pain?   yes    Location:   neck    Intensity:   5  Vitals Entered By: Levert Feinstein LPN (March 12, 579FGE 075-GRM PM)                Vision Comments: 05/2008   Chief Complaint:  physical.  History of Present Illness: obesity - lost another 5 pounds.  sisster just died of malnutriation secondary to gastric bypass surgery.  HTN: No shortness of breath, chest pain, edema.  Tolerating medications well.  Knows medication regimen.  No side effects.  bps high at home too.  ididn't take diovan-hct today b/c she doesn't take it if going out - makes her pee too much.  skips it often.  dm - saw optho in fall. taking metformin s problems.  last blood sugar 118  has had recent mammo, tetnus, pap (every 3 years),  colonoscopy 5/04.    has h/o goiter.  was on medication for it once but not anymore.  others in family also have goiter.    neck sore since he rsister died.  doesn't manifest stress verbally, represses it.    Diabetes Management History:      She has not been enrolled in the "Diabetic Education Program".  She states understanding of dietary principles and is following her diet appropriately.  No sensory loss is reported.  Self foot exams are being performed.  She is checking home blood sugars.  She says that she is exercising.  Type of exercise includes: walking.  Duration of exercise is estimated to be 30 min.  She is doing this 2 times per week.        Current Allergies: LISINOPRIL (LISINOPRIL)  Past Medical History:    H/O Redux use,     hyperthryroidism resolved 06/04-PTU d/c`d,     Recurrent vulvovaginitis    HTN    Obesity    Paresthesias    Osteoarthrtis    GERD    Back pain    Glucose intolerance.    stress echo normal 02/08   Family History:    father - DM, HTN,  CRF, COPD,    motehr - DM, HTN, PUD, MI 23s,     sisters and brothers - DM, HTN    Leukemia - sister died at 37 yo    Sister died of malnutrition        **early FHx of CAD - diffuse family history ot MI in 59s.  Social History:    no tob/ETOH/drugs; widow, 3 grown children Tomasa, Heisser); 7 grandchildren and 5 great grandchildren.   On disablility 12/04 for OA knees/spine.  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  Walks occasionally.     Physical Exam  General:     Well-developed,well-nourished,in no acute distress; alert,appropriate and cooperative throughout examination Head:     Normocephalic and atraumatic without obvious abnormalities. No apparent alopecia or balding. Eyes:     No corneal or conjunctival inflammation noted. EOMI. Perrla. Funduscopic exam benign, without hemorrhages, exudates or papilledema. Vision grossly normal. Ears:     External ear exam shows no significant lesions or deformities.  Otoscopic examination  reveals clear canals, tympanic membranes are intact bilaterally without bulging, retraction, inflammation or discharge. Hearing is grossly normal bilaterally. Nose:     External nasal examination shows no deformity or inflammation. Nasal mucosa are pink and moist without lesions or exudates. Mouth:     Oral mucosa and oropharynx without lesions or exudates. no upper teeth - dentures.  her own on bottom. Neck:     goiter. Lungs:     Normal respiratory effort, chest expands symmetrically. Lungs are clear to auscultation, no crackles or wheezes. Heart:     Normal rate and regular rhythm. S1 and S2 normal without gallop, murmur, click, rub or other extra sounds. Abdomen:     obese, nentender. +bs Msk:     neck muscles tight and sore. Neurologic:     alert & oriented X3 and cranial nerves II-XII intact.   Skin:     skin tags on neck Psych:     Cognition and judgment appear intact. Alert  and cooperative with normal attention span and concentration. No apparent delusions, illusions, hallucinations  Diabetes Management Exam:    Foot Exam (with socks and/or shoes not present):       Sensory-Pinprick/Light touch:          Left medial foot (L-4): normal          Left dorsal foot (L-5): normal          Left lateral foot (S-1): normal          Right medial foot (L-4): normal          Right dorsal foot (L-5): normal          Right lateral foot (S-1): normal       Sensory-Monofilament:          Left foot: normal          Right foot: normal       Inspection:          Left foot: normal          Right foot: normal       Nails:          Left foot: normal          Right foot: normal    Eye Exam:       Eye Exam done elsewhere          Date: 05/19/2007          Results: normal    Impression & Recommendations:  Problem # 1:  HYPERTENSION, BENIGN ESSENTIAL (ICD-401.1) Assessment: Deteriorated will switch diovan-hct to plain diovan to try to decrease peeing if it prevents her from going out.  will start norvasc for bp.  f/u 1 month. Her updated medication list for this problem includes:    Diovan 160 Mg Tabs (Valsartan) .[name]... 1 by mouth daily    Metoprolol Tartrate 50 Mg Tabs (Metoprolol tartrate) .[name]... Take 1 tablet by mouth twice a day    Norvasc 10 Mg Tabs (Amlodipine besylate) .[name]... 1 by mouth daily   Problem # 2:  GLUCOSE INTOLERANCE (ICD-271.3) Assessment: Unchanged stable good control.  need to check HbA1c at next visit. Orders: [facility]- Est  Level 4 [id number])   Problem # 3:  OBESITY, NOS (ICD-278.00) Assessment: Improved losing weight.  congrats. Orders: [facility]- Est  Level 4 ([id number])   Problem # 4:  GOITER, UNSPECIFIED (ICD-240.9) Assessment: Unchanged h/o hyperthrodism rexolved will recheck and follow as needed. Orders: [facility] [id number]) [facility]- Est  Level 4 [id number])  Complete Medication List: 1)  Aspir-81 81 Mg Tbec (Aspirin) .... Take 1 tablet by mouth  every morning 2)  Diovan 160 Mg Tabs (Valsartan) .[name].. 1 by mouth daily 3)  Ibuprofen 800 Mg Tabs (Ibuprofen) .... Take 1 tablet by mouth three times a day 4)  Metformin Hcl 500 Mg Tabs (Metformin hcl) .... Two times a day 5)  Metoprolol Tartrate 50 Mg Tabs (Metoprolol tartrate) .... Take 1 tablet by mouth twice a day 6)  Prilosec 20 Mg Cpdr (Omeprazole) .... Take 1 capsule by mouth once a day 7)  Zithromax Z-pak 250 Mg Tabs (Azithromycin) .... As directed 8)  Hydromet 5-1.5 Mg/62ml Syrp (Hydrocodone-homatropine) .... One teasp three times a day as needed., 120 cc 9)  Norvasc 10 Mg Tabs (Amlodipine besylate) .[name].. 1 by mouth daily  Other Orders: Direct LDL[facility] [phone] [phone]) Comp Met[facility] [id number])  Diabetes Management Assessment/Plan:      The following lipid goals have been established for the patient: Total cholesterol goal of 200; LDL cholesterol goal of 100; HDL cholesterol goal of 40; Triglyceride goal of 200.  Her blood pressure goal is < 130/80.       Prescriptions: NORVASC 10 MG  TABS (AMLODIPINE BESYLATE) 1 by mouth daily  #34 x 11   Entered and Authorized by:   Hortencia Pilar MD   Signed by:   Hortencia Pilar MD on 10/21/2007   Method used:   Print then Give to Patient   RxID:   [id number] METOPROLOL TARTRATE 50 MG TABS (METOPROLOL TARTRATE) Take 1 tablet by mouth twice a day  #68 x 3   Entered and Authorized by:   Hortencia Pilar MD   Signed by:   Hortencia Pilar MD on 10/21/2007   Method used:   Print then Give to Patient   RxID:   [id number] DIOVAN 160 MG  TABS (VALSARTAN) 1 by mouth daily  #34 x 11   Entered and Authorized by:   Hortencia Pilar MD   Signed by:   Hortencia Pilar MD on 10/21/2007   Method used:   Print then Give to Patient   RxID:   [id number] METOPROLOL TARTRATE 50 MG TABS (METOPROLOL TARTRATE) Take 1 tablet by mouth twice a day  #68 x 3   Entered and Authorized by:   Hortencia Pilar MD   Signed by:   Hortencia Pilar MD on 10/21/2007   Method  used:   Electronically sent to ...       [facility]. [id number]*       [address]       [place], [place]  [address]       Ph: [phone] or [phone]       Fax: [phone]   RxID:   [id number] DIOVAN 160 MG  TABS (VALSARTAN) 1 by mouth daily  #34 x 11   Entered and Authorized by:   Hortencia Pilar MD   Signed by:   Hortencia Pilar MD on 10/21/2007   Method used:   Electronically sent to ...       [facility]. [id number]*       [address]       [place], [place]  [address]       Ph: [phone] or [phone]       Fax: [phone]   RxID:   [id number] [place] 10 MG  TABS (AMLODIPINE BESYLATE) 1 by mouth daily  #34 x 11   Entered and Authorized by:   Hortencia Pilar MD   Signed by:   Hortencia Pilar MD on 10/21/2007   Method used:   Electronically sent to ...       [facility]. [id number]*       [address]       [place], [place]  [address]       Ph: [phone] or [phone]       Fax: [phone]   RxID:   [id number]  ]  Vital Signs:  Patient Profile:   65 Years Old Female Weight:      304.7 pounds Temp:     98.5 degrees F oral Pulse rate:   58 / minute BP sitting:   147 / 71    Location:   neck    Intensity:   5               Vision Comments: 05/2008

## 2010-09-10 NOTE — Assessment & Plan Note (Signed)
Summary: f/u shoulders per shaw   Vital Signs:  Patient profile:   65 year old female Height:      69 inches Weight:      311 pounds BP sitting:   130 / 70  Vitals Entered By: April Manson CMA (Dec 18, 2008 8:59 AM)  Primary Provider:  Patria Mane  MD   History of Present Illness: Pain in both shoulders but mostly the L-     Avoids using L-shoulder as much as possible  Pain is constant throughout the day and climaxes at night     ---Erin Coffey describes pain as beginning in anterior-superior region of L-shoulder and radiating down into the L-hand & Fingers; depicted as burning and nagging     ---Erin Coffey has had pain in her left shoulder since she admitted to swinging at her son in law; [name] does not remember when this occurred but describes it as "years ago"  R-shoulder pain is present as well,        ---Erin Coffey caught a 50-pound bag in 2002 and pain has been present since this incident      ----Caused a "pulling" pain that has been present since 2002       ---Pain on the right shoots down into the hand as well; pain on the R-shoulder originates in the shoulder blade      Allergies: 1)  Lisinopril (Lisinopril)  Physical Exam  Extremities:  L-shoulder is worse on exam;  Limited ER on both sides and limited IR on both; Left demonstrated more limitations than the R- Patient is unable to fully abduct her arms and begins to externally rotate her arms at approximately 75-80 degrees      During manual muscle testing of the biceps pain was present bilaterally but L-shoulder was worse       No pain with Speed's test on R-shoulder but pain is present with Speed's test in L-shoulder     Bilateral pain and discomfort, with noticeable signs of apprehension during Yergason's test; L- is worse than R-shoulder       Positive empty can test bilaterally [facility] pathology present bilaterally with more pain/discomfort on left during crossover test  Diminished sensation in medial &  lateral forearm on R  No Scapular Winging present  No pain in neck and negative Spurling's test Additional Exam:  Note Xrays reviewed severe DJD of AC joints she has spurring on humeral head spurring also present on coracoid prob intobiciptial tendon    Impression & Recommendations:  Problem # 1:  OSTEOARTHRITIS, MULTI SITES (ICD-715.98) Assessment Unchanged  Bilateral shoulder injections for significant AC arthritis and bursitis. Patient tolerated well. See instructions. FU with Dr [name] for improvement.   Her updated medication list for this problem includes:    Aspir-81 81 Mg Tbec (Aspirin) .[name]... Take 1 tablet by mouth every morning    Diclofenac Sodium 75 Mg Tbec (Diclofenac sodium) .[name]... 1 by mouth two times a day for pain  Orders: Joint Aspirate / Injection, Large (20610) Joint Aspirate / Injection, Large (20610)  Problem # 2:  SHOULDER PAIN, BILATERAL (ICD-719.41)  Her updated medication list for this problem includes:    Aspir-81 81 Mg Tbec (Aspirin) .[name]... Take 1 tablet by mouth every morning    Diclofenac Sodium 75 Mg Tbec (Diclofenac sodium) .[name]... 1 by mouth two times a day for pain    Cyclobenzaprine Hcl 10 Mg Tabs (Cyclobenzaprine hcl) .[name]... 1/2 - 1 tablets 1-2 times daily as needed for muscle spasm  Orders:  Joint Aspirate / Injection, Large (20610) Joint Aspirate / Injection, Large (20610)  should continue pendulm and ROM exercises we will see back 3 to 4 mos in [facility]  Complete Medication List: 1)  Aspir-81 81 Mg Tbec (Aspirin) .... Take 1 tablet by mouth every morning 2)  Diovan Hct 320-25 Mg Tabs (Valsartan-hydrochlorothiazide) .[name].. 1 by mouth once daily for blood pressure 3)  Metformin Hcl 500 Mg Tabs (Metformin hcl) .[name].. 1 po two times a day.  last refill before visit with primary doctor. 4)  Metoprolol Tartrate 50 Mg Tabs (Metoprolol tartrate) .... Take 1 tablet by mouth twice a day 5)  Prilosec 20 Mg Cpdr (Omeprazole) .... Take 1 capsule by mouth once a  day 6)  Diclofenac Sodium 75 Mg Tbec (Diclofenac sodium) .[name].. 1 by mouth two times a day for pain 7)  Cyclobenzaprine Hcl 10 Mg Tabs (Cyclobenzaprine hcl) .... 1/2 - 1 tablets 1-2 times daily as needed for muscle spasm  Patient Instructions: 1)  We injected both your shoulders today. 2)  You will be a little more sore for the next few days but you should notice a significant relief of your pain, especially at night, over the next week or two. 3)  Ice your shoulders for the next few days as much as possible.  4)  Work on moving your shoulders around, climbing the wall with your fingers on both sides like Dr [name] showed you.  5)  Please follow up with Dr [name] in 2-3 months for this, if not sooner.      Procedure Note Last Tetanus: Done. (02/08/2002)  Injections:  Procedure # 1: joint injection    Region: R shoulder    Technique: 3in needle used     Medication: 1cc kenalog 40    Anesthesia: 5cc marcaine  Procedure # 2: joint injection    Region: L shoulder    Technique: 3 in needle used    Medication: 1cc kenalog 40    Anesthesia: 5cc marcain    Comment: use EC for numbing  Cleaned and prepped with: alcohol Wound dressing: bandaid Instructions: ice

## 2010-09-10 NOTE — Progress Notes (Signed)
Summary: Requesting labs  Phone Note From Other Clinic Call back at [phone] ext [phone]   Caller: [facility] Summary of Call: Requesting labs from yesterday be faxed to them once the doctor has signed off on them, fax to [phone] Initial call taken by: [name],  November 13, 2009 9:48 AM  Follow-up for Phone Call        please fax over. thanks Follow-up by: Patria Mane  MD,  November 14, 2009 8:40 AM  Additional Follow-up for Phone Call Additional follow up Details #1::        Labs faxed. Additional Follow-up by: Levert Feinstein LPN,  April  6, 624THL 10:07 AM

## 2010-09-10 NOTE — Assessment & Plan Note (Signed)
Summary: f/up on lab wk,tcb   Vital Signs:  Patient profile:   65 year old female Height:      69 inches Weight:      299.3 pounds BMI:     44.36 Temp:     95.5 degrees F oral Pulse rate:   81 / minute BP sitting:   155 / 74  (left arm) Cuff size:   large  Vitals Entered By: Levert Feinstein LPN (February 22, 624THL 1:51 PM) CC: f/u labs and meds Is Patient Diabetic? No Pain Assessment Patient in pain? yes     Location: rt hip   Primary Care Provider:  Patria Mane  MD  CC:  f/u labs and meds.  History of Present Illness: PMR-ESR was 117, had good response to prednisone in 2-3 days.  better than the shoulder injections.  has stopped taking pain medicine  PICA-no symptoms now, taking iron qday.  no dark stools, ofter constipated, takes stool softener. last colonoscpy 2004.  BP- takes at home, often SBP of 145-150.  no h/a, dizziness  DM- checks CBGs at home, around 120s in AM.    Habits & Providers  Alcohol-Tobacco-Diet     Tobacco Status: never   Prevention & Chronic Care Immunizations   Influenza vaccine: Fluvax 3+  (08/08/2009)   Influenza vaccine deferral: Not available  (04/06/2009)   Influenza vaccine due: 06/05/2009    Tetanus booster: 02/08/2002: Done.   Tetanus booster due: 02/09/2012    Pneumococcal vaccine: Not documented    H. zoster vaccine: Not documented  Colorectal Screening   Hemoccult: Done.  (04/11/2002)   Hemoccult due: Not Indicated    Colonoscopy: Done.  (12/10/2002)   Colonoscopy due: 12/2012  Other Screening   Pap smear: Done.  (08/11/2006)   Pap smear due: 08/2009    Mammogram: Normal  (07/17/2008)   Mammogram action/deferral: Not indicated  (10/02/2009)   Mammogram due: 07/17/2009    DXA bone density scan: Not documented   DXA bone density action/deferral: Deferred  (10/02/2009)    Smoking status: never  (10/02/2009)  Diabetes Mellitus   HgbA1C: 6.3  (08/08/2009)   Hemoglobin A1C due: 01/31/2009    Eye exam: normal   (11/14/2008)   Eye exam due: 11/14/2009    Foot exam: yes  (08/08/2009)   High risk foot: Not documented   Foot care education: Not documented   Foot exam due: 10/31/2009    Urine microalbumin/creatinine ratio: Not documented   Urine microalbumin action/deferral: Not indicated    Diabetes flowsheet reviewed?: Yes   Progress toward A1C goal: At goal  Hypertension   Last Blood Pressure: 155 / 74  (10/02/2009)   Serum creatinine: 1.41  (08/08/2009)   Serum potassium 4.4  (08/08/2009)    Hypertension flowsheet reviewed?: Yes   Progress toward BP goal: Unchanged  Lipids   Total Cholesterol: 187  (10/31/2008)   LDL: 113  (10/31/2008)   LDL Direct: 107  (10/21/2007)   HDL: 63  (10/31/2008)   Triglycerides: 53  (10/31/2008)    SGOT (AST): 25  (08/08/2009)   SGPT (ALT): 9  (08/08/2009)   Alkaline phosphatase: 76  (08/08/2009)   Total bilirubin: 0.5  (08/08/2009)    Lipid flowsheet reviewed?: Yes   Progress toward LDL goal: Unchanged  Self-Management Support :   Personal Goals (by the next clinic visit) :     Personal A1C goal: 8  (08/08/2009)     Personal blood pressure goal: 130/80  (04/06/2009)     Personal LDL  goal: 100  (04/06/2009)    Patient will work on the following items until the next clinic visit to reach self-care goals:     Medications and monitoring: take my medicines every day, bring all of my medications to every visit, examine my feet every day  (04/06/2009)     Activity: take a 30 minute walk every day  (04/06/2009)    Diabetes self-management support: Written self-care plan  (10/02/2009)   Diabetes care plan printed    Diabetes self-management support not done because: Good outcomes  (08/08/2009)    Hypertension self-management support: Written self-care plan  (10/02/2009)   Hypertension self-care plan printed.    Lipid self-management support: Written self-care plan  (10/02/2009)   Lipid self-care plan printed.    Lipid self-management support not  done because: Not indicated  (08/08/2009)  Allergies: 1)  Lisinopril (Lisinopril)  Physical Exam  General:  obese, in no acute distress; alert,appropriate and cooperative throughout examination Neck:  No deformities, masses, or tenderness noted. Lungs:  Normal respiratory effort, chest expands symmetrically. Lungs are clear to auscultation, no crackles or wheezes. Heart:  Normal rate and regular rhythm. S1 and S2 normal without gallop, murmur, click, rub or other extra sounds. Abdomen:  Bowel sounds positive,abdomen soft and non-tender without masses, organomegaly or hernias noted. Rectal:  small external hemorrhoid, not  swollen stool FOB negative soft brown scant stool in rectal vault   Impression & Recommendations:  Problem # 1:  UNSPECIFIED IRON DEFICIENCY ANEMIA (ICD-280.9) Assessment Unchanged checked a CBC today.  has been taking iron, but low dose.  if hgb now normal, less concerning for source of bleed.  however, will send to GI for c/s because has not had colonoscopy since 2004.  would consider check for h pylori at later date. continue iron until check CBC, asked her to increase dose to BID FOB negative  Her updated medication list for this problem includes:    Ferrous Sulfate 325 (65 Fe) Mg Tabs (Ferrous sulfate) .[name]... 1 by mouth once daily for anemia.  Orders: Comp Met-[facility] [phone]) CBC-[facility] [id number]) Gastroenterology Referral (GI) [facility]- Est  Level 4 [id number])  Problem # 2:  POLYMYALGIA RHEUMATICA (ICD-725) Assessment: Improved Since symptoms are improved, asked pt to try ten mg, if symptoms return, increase dose.  Other diagnoses for high ESR are infection and malignancies.   Her updated medication list for this problem includes:    Aspir-81 81 Mg Tbec (Aspirin) .[name]... Take 1 tablet by mouth every morning    Prednisone 10 Mg Tabs (Prednisone) .[name]... 1 tab by mouth once daily for shoulder pain.  Orders: [facility]- Est  Level 4 [id number])  Problem # 3:  DIABETES MELLITUS,  TYPE II, CONTROLLED, MILD (ICD-250.00) Assessment: Unchanged last A1c 6.3, continues to check CBG, will recheck in 1 month as has been on prednisone. if cr high on this check, need to think about metoformin.  will likely keep it on and keep rechecking cr q month. Her updated medication list for this problem includes:    Aspir-81 81 Mg Tbec (Aspirin) .[name]... Take 1 tablet by mouth every morning    Diovan Hct 320-25 Mg Tabs (Valsartan-hydrochlorothiazide) .[name]... 1 by mouth once daily for blood pressure    Metformin Hcl 500 Mg Tabs (Metformin hcl) .[name]... 1 po two times a day for diabetes  Orders: [facility]- Est  Level 4 [id number])  Problem # 4:  HYPERTENSION, BENIGN ESSENTIAL (ICD-401.1) Assessment: Unchanged BP elevated today. pt only taking metop once a day.  asked her  to increase to two times a day, return to clinic for nurse visit and bp check. Her updated medication list for this problem includes:    Diovan Hct 320-25 Mg Tabs (Valsartan-hydrochlorothiazide) .[name]... 1 by mouth once daily for blood pressure    Metoprolol Tartrate 50 Mg Tabs (Metoprolol tartrate) .[name]... Take 1 tablet by mouth twice a day  Orders: [facility]- Est  Level 4 [id number])  Complete Medication List: 1)  Aspir-81 81 Mg Tbec (Aspirin) .... Take 1 tablet by mouth every morning 2)  Diovan Hct 320-25 Mg Tabs (Valsartan-hydrochlorothiazide) .[name].. 1 by mouth once daily for blood pressure 3)  Metformin Hcl 500 Mg Tabs (Metformin hcl) .[name].. 1 po two times a day for diabetes 4)  Metoprolol Tartrate 50 Mg Tabs (Metoprolol tartrate) .... Take 1 tablet by mouth twice a day 5)  Prilosec 20 Mg Cpdr (Omeprazole) .... Take 1 capsule by mouth once a day 6)  Prednisone 10 Mg Tabs (Prednisone) .[name].. 1 tab by mouth once daily for shoulder pain. 7)  Ferrous Sulfate 325 (65 Fe) Mg Tabs (Ferrous sulfate) .[name].. 1 by mouth once daily for anemia.  Patient Instructions: 1)  It was nice to meet you today 2)  We rechecked your blood work today.  I will mail you the  results. 3)  I will refer you to the GI doctor for evaluation.  They will call you with the appt. 4)  Lets try your prednisone at 10mg  per day.  Try this for 1 week.  If your shoulder symptoms return, go back to 15mg  and give me a call. 5)  for your blood pressure, take your metoprolol twice per day. 6)  Also, for your nutrition and bone health, take a calcium and vitamin D supplement.  You can take this twice a day if the tablet if 500 of calcium.   7)  Also, if you have pain, you can take 1000mg  tylenol 3 times per day. Prescriptions: PREDNISONE 10 MG TABS (PREDNISONE) 1 tab by mouth once daily for shoulder pain.  #30 x 3   Entered and Authorized by:   Marlana Salvage MD   Signed by:   Marlana Salvage MD on 10/02/2009   Method used:   Electronically to        [facility] (retail)       [place], [place]  [id number]       Ph: [phone]       Fax: [phone]   RxID:   [id number]

## 2010-09-10 NOTE — Progress Notes (Signed)
Summary: Triage  Phone Note Call from Patient Call back at [phone]   Summary of Call: Pt wants to know if she can go get an xray of her back, as it is still hurting her very badily. Initial call taken by: Drucie Ip,  February 29, 2008 3:28 PM  Follow-up for Phone Call        uses [facility] on [address]. out of meds. still having bad pain. leaving for [place] ( son's wife is having a baby). wants to know if a back xray will help & wants refills on meds. told her I will send request to md & will call her with response Follow-up by: Elige Radon RN,  February 29, 2008 3:31 PM  Additional Follow-up for Phone Call Additional follow up Details #1::        Xrays aren't very good for the back - if we find a reason to image it, we would use something else.  Willing to refill meds but should be seen soon if not getting better or if gets any fevers, loss of bowel or bladder function, changes in sensation. Additional Follow-up by: Hortencia Pilar MD,  March 01, 2008 8:33 AM    Additional Follow-up for Phone Call Additional follow up Details #2:: Follow-up by: Elige Radon RN,  March 01, 2008 8:37 AM    Prescriptions: FLEXERIL 10 MG  TABS (CYCLOBENZAPRINE HCL) 1 by mouth at bedtime prn  #10 x 0   Entered and Authorized by:   Hortencia Pilar MD   Signed by:   Hortencia Pilar MD on 03/01/2008   Method used:   Electronically sent to ...       [facility]. [id number]*       [address]       [place], [place]  [address]       Ph: [phone] or [phone]       Fax: [phone]   RxID:   [id number] DICLOFENAC SODIUM 75 MG  TBEC (DICLOFENAC SODIUM) 1 by mouth bid  #30 x 0   Entered and Authorized by:   Hortencia Pilar MD   Signed by:   Hortencia Pilar MD on 03/01/2008   Method used:   Electronically sent to ...       [facility]. [id number]*       [address] [place] [address]       [place],   [address]       Ph: [phone]  or [phone]       Fax: [phone]   RxID:   [id number]  gave her above info. she will try the meds & will call for appt if not improved. denies fever, loss of continence or sensation changes. told her to call asap if they develop.she agreed.Elige Radon RN  March 01, 2008 8:39 AM

## 2010-09-10 NOTE — Progress Notes (Signed)
Summary: refill  Phone Note Refill Request Call back at Home Phone [phone] Message from:  Patient  Refills Requested: Medication #1:  METOPROLOL TARTRATE 50 MG TABS Take 1 tablet by mouth twice a day states pharm has been trying to call it in  Initial call taken by: Audie Clear,  May 17, 2010 9:21 AM  Follow-up for Phone Call       Follow-up by: Elige Radon RN,  May 17, 2010 9:23 AM    Prescriptions: METOPROLOL TARTRATE 50 MG TABS (METOPROLOL TARTRATE) Take 1 tablet by mouth twice a day  #60 x 0   Entered by:   Elige Radon RN   Authorized by:   Cletus Gash MD   Signed by:   Elige Radon RN on 05/17/2010   Method used:   Electronically to        [facility] (retail)       [place], [place]  [id number]       Ph: [phone]       Fax: [phone]   RxID:   [id number]

## 2010-09-10 NOTE — Letter (Signed)
Summary: Generic Letter  [facility]  [address]   [place], [place] [address]   Phone: [phone]  Fax: [phone]    06/18/2010  Erin Coffey [address] [place], [place]  [address]  Dear [name],  Your complete blood counts remain stable Your hemoglobin was 10.3, a little bit higher (better) than the last time it was checked. Please continue to take your Iron supplementation.  If you have any symptoms of weakness or fatigue, or have any questions please contact the office.       Sincerely,   Cletus Gash MD  Appended Document: Generic Letter mailed

## 2010-09-10 NOTE — Letter (Signed)
Summary: Generic Letter  [facility]  [address]   [place], [place] [address]   Phone: [phone]  Fax: [phone]    10/22/2007 MRN: [id number]  [address] [place], [place]  [address]  Dear Ms. Erin Coffey,  Your electrolytes, kidney function, and liver function were normal.    Sodium                    143 mEq/L                   135-145   Potassium                 4.5 mEq/L                   3.5-5.3   Chloride                  106 mEq/L                   96-112   CO2                       25 mEq/L                    19-32   Glucose                   77 mg/dL                    70-99   BUN                       9 mg/dL                     6-23   Creatinine                0.94 mg/dL                  0.40-1.20   Bilirubin, Total          0.5 mg/dL                   0.3-1.2   Alkaline Phosphatase      65 U/L                      39-117   AST/SGOT                  16 U/L                      0-37   ALT/SGPT                  10 U/L                      0-35   Total Protein             8.2 g/dL                    6.0-8.3   Albumin                   3.8 g/dL                    3.5-5.2   Calcium  9.0 mg/dL                   8.4-10.5  Your thyroid was normal.    TSH                       2.403 uIU/mL                0.350-5.50      Your "Bad" cholesterol goal is less than 100.  Please try to work on your diet and try to exercise every day.  You are close to you goal but if you don't reach it, we will need to start a medication.  We will discuss this more at your next visit.    LDL, Direct       [H]    107 mg/dL     Sincerely,      Hortencia Pilar MD [place]

## 2010-09-10 NOTE — Assessment & Plan Note (Signed)
Summary: FU/KH   Vital Signs:  Patient Profile:   65 Years Old Female Weight:      303.7 pounds Temp:     98.4 degrees F oral Pulse rate:   75 / minute BP sitting:   134 / 76  (right arm)  Pt. in pain?   no  Vitals Entered By: Levert Feinstein LPN (April 14, 579FGE 579FGE AM)                  Chief Complaint:  f/u visit.  History of Present Illness: HTN: No shortness of breath, chest pain, edema.  Tolerating medications well.  Knows medication regimen.  No side effects.     Current Allergies: LISINOPRIL (LISINOPRIL)      Physical Exam  General:     Well-developed,well-nourished,in no acute distress; alert,appropriate and cooperative throughout examination Lungs:     Normal respiratory effort, chest expands symmetrically. Lungs are clear to auscultation, no crackles or wheezes. Heart:     Normal rate and regular rhythm. S1 and S2 normal without gallop, murmur, click, rub or other extra sounds. Extremities:     no edema    Impression & Recommendations:  Problem # 1:  HYPERTENSION, BENIGN SYSTEMIC (ICD-401.1) doing better off hctz - not peeing as much.  no side effects from new norvasc.  rtc 3 mo. Her updated medication list for this problem includes:    Diovan 160 Mg Tabs (Valsartan) .[name]... 1 by mouth daily    Metoprolol Tartrate 50 Mg Tabs (Metoprolol tartrate) .[name]... Take 1 tablet by mouth twice a day    Norvasc 10 Mg Tabs (Amlodipine besylate) .[name]... 1 by mouth daily  Orders: [facility]- Est Level  3 [id number])   Complete Medication List: 1)  Aspir-81 81 Mg Tbec (Aspirin) .... Take 1 tablet by mouth every morning 2)  Diovan 160 Mg Tabs (Valsartan) .[name].. 1 by mouth daily 3)  Ibuprofen 800 Mg Tabs (Ibuprofen) .... Take 1 tablet by mouth three times a day 4)  Metformin Hcl 500 Mg Tabs (Metformin hcl) .... Two times a day 5)  Metoprolol Tartrate 50 Mg Tabs (Metoprolol tartrate) .... Take 1 tablet by mouth twice a day 6)  Prilosec 20 Mg Cpdr (Omeprazole) .... Take 1 capsule by  mouth once a day 7)  Zithromax Z-pak 250 Mg Tabs (Azithromycin) .... As directed 8)  Hydromet 5-1.5 Mg/46ml Syrp (Hydrocodone-homatropine) .... One teasp three times a day as needed., 120 cc 9)  Norvasc 10 Mg Tabs (Amlodipine besylate) .[name].. 1 by mouth daily     ]

## 2010-09-10 NOTE — Progress Notes (Signed)
  Phone Note Outgoing Call Call back at [facility] Phone [phone]   Summary of Call: again gallstones are noted without acute inflammation.  i suspect this is the cause for her abdominal pain she describes.  called her to discuss this result and plan from here.  no answer on her phone.  please let her know there is no reason to acutely do anything about this but if she desires we can set up an appt for her with a surgeon at [facility] to discuss possibly getting gallbladder taken out for treatment.   Patria Mane  MD  Dec 12, 2009 12:10 PM Initial call taken by: Patria Mane  MD,  Dec 12, 2009 12:11 PM

## 2010-09-10 NOTE — Assessment & Plan Note (Signed)
Summary: 2 month f/u/el   Vital Signs:  Patient Profile:   65 Years Old Female Weight:      312.2 pounds Temp:     98.8 degrees F Pulse rate:   69 / minute BP sitting:   131 / 71  Pt. in pain?   no  Vitals Entered By: Sutton, (November 27, 2006 10:41 AM)                Chief Complaint:  2 mo f/u.  History of Present Illness: "f/u DM"  Highest blood sugar was 149.  Lowest 78.  Doing well on metformin and diet.  Check feet often.  Walks in tennis shoes.  seeing optopmologiest next month 5/9.    bp well controlled though she doesn't check it often at home b/c her machie broke.  no chest pain, sob, edema.    Diabetes Management History:      She has not been enrolled in the "Diabetic Education Program".  She states understanding of dietary principles and is following her diet appropriately.  No sensory loss is reported.  Self foot exams are being performed.  She is checking home blood sugars.  She says that she is exercising.  Type of exercise includes: walking.  Duration of exercise is estimated to be 30 min.  She is doing this 5 times per week.        Hypoglycemic symptoms are not occurring.  No hyperglycemic symptoms are reported.        There are no symptoms to suggest diabetic complications.  Since her last visit, no infections have occurred.  No changes have been made to her treatment plan since last visit.        Family History:    Reviewed history from 10/08/2006 and no changes required:       father - DM, HTN, CRF, COPD, motehr - DM, HTN, PUD, MI 33s, sisters and brothers - DM, HTN       **early FHx of CAD - diffuse family history ot MI in 19s.  Social History:    Reviewed history from 10/08/2006 and no changes required:       no tob/ETOH/drugs; widow, 3 grown children Julyana, Bedrick); 7 grandchildren and 3 great grandchildren.  . On disablility 12/04 for OA knees/spine.  Worked in Production designer, theatre/television/film.   Risk Factors:  Exercise:   yes    Times per week:  5    Type:  walking    Physical Exam  General:     Well-developed,well-nourished,in no acute distress; alert,appropriate and cooperative throughout examination Neck:     No deformities, masses, or tenderness noted. Chest Wall:     No deformities, masses, or tenderness noted. Lungs:     Normal respiratory effort, chest expands symmetrically. Lungs are clear to auscultation, no crackles or wheezes. Heart:     Normal rate and regular rhythm. S1 and S2 normal without gallop, murmur, click, rub or other extra sounds. Extremities:     No clubbing, cyanosis, edema, or deformity noted with normal full range of motion of all joints.    Feet without sores or ulcers.  good pedal puslses bilatearlly.    Impression & Recommendations:  Problem # 1:  DM (ICD-250.00) Assessment: Unchanged see optho next month.  blood sugars well cotnrolled on metformin.  no concerns.  understands diet.  increasing her walking.  will check HBA1C next month.  refilled metformin. Orders: [facility]- Est Level  3 [id number])  Problem # 2:  HYPERTENSION, BENIGN ESSENTIAL (ICD-401.1) Assessment: Unchanged bps wellcontrolled.  no end organ damage.  taking metorpolol, diovan HCT.  brought in bottles and has been refilling appropriately.   Orders: [facility]- Est Level  3 [id number])   Problem # 3:  OBESITY NOS (ICD-278.00) Assessment: Improved Had from 340 to 328, to 311.  Now 312. Continueing walking, diet.  encouraged and congratuatlated patient.    Other Orders: A1C-[facility] [id number])  Diabetes Management Assessment/Plan:      The following lipid goals have been established for the patient: Total cholesterol goal of 200; LDL cholesterol goal of 100; HDL cholesterol goal of 40; Triglyceride goal of 200.     Laboratory Results   Blood Tests   Date/Time Recieved: November 27, 2006 10:47 AM  Date/Time Reported: November 27, 2006 10:59 AM   HGBA1C: 6.0%   (Normal Range: Non-Diabetic - 3-6%   Control Diabetic -  6-8%)  Comments: ...................................................................DONNA [facility]  November 27, 2006 10:59 AM

## 2010-09-10 NOTE — Letter (Signed)
Summary: New Patient letter  [facility]  [address] [place], [place] [address]   Phone: [phone]  Fax: [phone]       10/10/2009 MRN: [id number]  Erin Coffey [address] [place], [place]  [address]  Dear Erin Coffey,  Welcome to the Gastroenterology Division at [facility].    You are scheduled to see Dr.  Erskine Emery on October 24, 2009 at 10:00am on the 3rd floor at [facility] [place] [facility].  We ask that you try to arrive at our office 15 minutes prior to your appointment time to allow for check-in.  We would like you to complete the enclosed self-administered evaluation form prior to your visit and bring it with you on the day of your appointment.  We will review it with you.  Also, please bring a complete list of all your medications or, if you prefer, bring the medication bottles and we will list them.  Please bring your insurance card so that we may make a copy of it.  If your insurance requires a referral to see a specialist, please bring your referral form from your primary care physician.  Co-payments are due at the time of your visit and may be paid by cash, check or credit card.     Your office visit will consist of a consult with your physician (includes a physical exam), any laboratory testing he/she may order, scheduling of any necessary diagnostic testing (e.g. x-ray, ultrasound, CT-scan), and scheduling of a procedure (e.g. Endoscopy, Colonoscopy) if required.  Please allow enough time on your schedule to allow for any/all of these possibilities.    If you cannot keep your appointment, please call [phone] to cancel or reschedule prior to your appointment date.  This allows [name] the opportunity to schedule an appointment for another patient in need of care.  If you do not cancel or reschedule by 5 p.m. the business day prior to your appointment date, you will be charged a $50.00 late cancellation/no-show fee.    Thank  you for choosing [facility] for your medical needs.  We appreciate the opportunity to care for you.  Please visit [name] at our website  to learn more about our practice.                     Sincerely,                                                             The Gastroenterology Division

## 2010-09-12 NOTE — Miscellaneous (Signed)
  Clinical Lists Changes  Problems: Removed problem of BACK PAIN, THORACIC REGION (ICD-724.1) Removed problem of SHOULDER PAIN, BILATERAL (ICD-719.41)

## 2010-09-12 NOTE — Progress Notes (Signed)
Summary: Referral  Phone Note Call from Patient   Call For: [phone] Summary of Call: Ms. Cichosz need a referral for the hand doctor.  Want a morning appt for any day except Wednesday. Initial call taken by: Eusebio Friendly,  August 14, 2010 4:22 PM

## 2010-10-07 ENCOUNTER — Other Ambulatory Visit: Payer: Self-pay | Admitting: Family Medicine

## 2010-10-14 ENCOUNTER — Other Ambulatory Visit: Payer: Self-pay | Admitting: *Deleted

## 2010-10-14 DIAGNOSIS — I10 Essential (primary) hypertension: Secondary | ICD-10-CM

## 2010-10-14 MED ORDER — VALSARTAN-HYDROCHLOROTHIAZIDE 320-25 MG PO TABS: 1.0000 | ORAL_TABLET | Freq: Every day | ORAL | Status: DC

## 2010-10-24 ENCOUNTER — Encounter: Payer: Self-pay | Admitting: *Deleted

## 2010-10-29 ENCOUNTER — Ambulatory Visit (INDEPENDENT_AMBULATORY_CARE_PROVIDER_SITE_OTHER): Payer: Medicare Other | Admitting: Family Medicine

## 2010-10-29 ENCOUNTER — Encounter: Payer: Self-pay | Admitting: Family Medicine

## 2010-10-29 VITALS — BP 101/58 | HR 71 | Temp 99.6°F | Wt 286.9 lb

## 2010-10-29 DIAGNOSIS — E785 Hyperlipidemia, unspecified: Secondary | ICD-10-CM

## 2010-10-29 DIAGNOSIS — D509 Iron deficiency anemia, unspecified: Secondary | ICD-10-CM

## 2010-10-29 DIAGNOSIS — N19 Unspecified kidney failure: Secondary | ICD-10-CM

## 2010-10-29 DIAGNOSIS — E669 Obesity, unspecified: Secondary | ICD-10-CM

## 2010-10-29 DIAGNOSIS — E119 Type 2 diabetes mellitus without complications: Secondary | ICD-10-CM

## 2010-10-29 LAB — CBC
HCT: 31.2 % — ABNORMAL LOW (ref 36.0–46.0)
Hemoglobin: 10.1 g/dL — ABNORMAL LOW (ref 12.0–15.0)
MCH: 27.7 pg (ref 26.0–34.0)
MCHC: 32.4 g/dL (ref 30.0–36.0)
MCV: 85.5 fL (ref 78.0–100.0)
Platelets: 248 10*3/uL (ref 150–400)
RBC: 3.65 MIL/uL — ABNORMAL LOW (ref 3.87–5.11)
RDW: 15.5 % (ref 11.5–15.5)
WBC: 6.7 10*3/uL (ref 4.0–10.5)

## 2010-10-29 LAB — BASIC METABOLIC PANEL
BUN: 28 mg/dL — ABNORMAL HIGH (ref 6–23)
CO2: 20 mEq/L (ref 19–32)
Calcium: 9.3 mg/dL (ref 8.4–10.5)
Chloride: 104 mEq/L (ref 96–112)
Creat: 1.89 mg/dL — ABNORMAL HIGH (ref 0.40–1.20)
Glucose, Bld: 89 mg/dL (ref 70–99)
Potassium: 4.3 mEq/L (ref 3.5–5.3)
Sodium: 138 mEq/L (ref 135–145)

## 2010-10-29 LAB — POCT GLYCOSYLATED HEMOGLOBIN (HGB A1C): Hemoglobin A1C: 6.3

## 2010-10-29 NOTE — Patient Instructions (Signed)
It was good to see you.  You have lost almost 10 lbs since November- great job, keep it up.  We will draw labs to check on your Diabetes, Kidney function, and anemia today.  Please make a lab appointment for first thing in the morning to have your cholesterol checked.  Do not eat or drink anything (water is OK) before you have that lab drawn.  I will let you know the results of your tests in 1-2 weeks.

## 2010-10-31 ENCOUNTER — Other Ambulatory Visit: Payer: Medicare Other

## 2010-10-31 DIAGNOSIS — E785 Hyperlipidemia, unspecified: Secondary | ICD-10-CM

## 2010-10-31 LAB — LIPID PANEL
Cholesterol: 181 mg/dL (ref 0–200)
HDL: 58 mg/dL (ref >39)
LDL Cholesterol: 112 mg/dL — ABNORMAL HIGH (ref 0–99)
Total CHOL/HDL Ratio: 3.1 Ratio
Triglycerides: 54 mg/dL (ref <150)
VLDL: 11 mg/dL (ref 0–40)

## 2010-10-31 NOTE — Progress Notes (Signed)
Drew pt for a fasting lipid. 1sst. AC

## 2010-11-03 LAB — GLUCOSE, CAPILLARY: Glucose-Capillary: 113 mg/dL — ABNORMAL HIGH (ref 70–99)

## 2010-11-05 ENCOUNTER — Encounter: Payer: Self-pay | Admitting: Family Medicine

## 2010-11-05 NOTE — Assessment & Plan Note (Signed)
Will check creatinine to monitor kidney function.

## 2010-11-05 NOTE — Progress Notes (Signed)
Subjective:     Patient ID: Erin Coffey, female   DOB: 01/31/1946, 65 y.o.   MRN: [id number]  HPI: Erin Coffey presents for follow up of her Diabetes, HLD, Anemia, and Chronic Kidney disease.  DM- Pt reports her blood sugars have been between 80-120 in the mornings, and 100-150's after dinner.  She reports no hypoglycemic events, and no problems with polyuria, polydipsia.  She denies any ulcers or numbness in her feet.   Obesity- pt has lost about 10 lbs since her last visit.  She says she has been eating healthier, more vegetables. She says she is "on her feet a lot" at church most days of the week, but does not necessarily exercise.   Anemia- pt is not having troublesome side effects from taking iron.  She denies fatigue or PICA symptoms at this time.  CKD- Pt is followed at [facility] for CKD III, but has not seen them since July.  She says she thinks that is the last time her kidney function was checked.  She reports no difficulty urinating, no swelling.    Review of Systems Negative except stated in HPI.     Objective:   Physical Exam BP 101/58  Pulse 71  Temp(Src) 99.6 F (37.6 C) (Oral)  Wt 286 lb 14.4 oz (130.137 kg) General appearance: alert, cooperative and no distress Eyes: conjunctivae/corneas clear. PERRL, EOM's intact. Fundi benign. Throat: lips, mucosa, and tongue normal; teeth and gums normal Neck: no adenopathy, no JVD and supple, symmetrical, trachea midline Lungs: clear to auscultation bilaterally Heart: regular rate and rhythm, S1, S2 normal, no murmur, click, rub or gallop Abdomen: soft, non-tender; bowel sounds normal; no masses,  no organomegaly Extremities: extremities normal, atraumatic, no cyanosis or edema, no ulcers, gangrene or trophic changes and good sensation througout feet.  Pulses: 2+ and symmetric

## 2010-11-05 NOTE — Assessment & Plan Note (Signed)
Will check hemoglobin.  Pt currently taking iron with no problems and no symptoms of anemia.  Continue Iron.

## 2010-11-05 NOTE — Assessment & Plan Note (Signed)
Will check a fasting lipid panel to monitor pt's HLD.  She is not currently on a statin as she has been diet controlled previously.

## 2010-11-05 NOTE — Assessment & Plan Note (Signed)
Pt has been eating healthier and has lost about 10 lbs.  Encouraged her to add exercise to her routine as well.

## 2010-11-05 NOTE — Assessment & Plan Note (Signed)
Pt continues to report good blood sugar control, will check HgA1c to monitor.  No medication changes today.

## 2010-11-06 ENCOUNTER — Telehealth: Payer: Self-pay | Admitting: Family Medicine

## 2010-11-06 NOTE — Telephone Encounter (Signed)
Erin Coffey,  Have you seen these yet?  Is it ok to send a letter?

## 2010-11-06 NOTE — Telephone Encounter (Signed)
Pt is wanting to have labs mailed to her - has not heard results

## 2010-11-19 ENCOUNTER — Other Ambulatory Visit: Payer: Self-pay | Admitting: *Deleted

## 2010-11-19 MED ORDER — METFORMIN HCL 500 MG PO TABS: 500.0000 mg | ORAL_TABLET | Freq: Two times a day (BID) | ORAL | Status: DC

## 2010-12-23 ENCOUNTER — Encounter: Payer: Self-pay | Admitting: Family Medicine

## 2010-12-27 NOTE — Op Note (Signed)
NAMEELIZABTH, MCCARRY NO.:  [id number]   MEDICAL RECORD NO.:  [id number]                   PATIENT TYPE:  AMB   LOCATION:  [place]                                  FACILITY:  [facility]   PHYSICIAN:  Ninetta Lights, M.D.              DATE OF BIRTH:  [birth year]   DATE OF PROCEDURE:  01/06/2003  DATE OF DISCHARGE:                                 OPERATIVE REPORT   PREOPERATIVE DIAGNOSIS:  Right knee degenerative medial meniscus tear with  chondromalacia patella and degenerative arthritis.   POSTOPERATIVE DIAGNOSES:  Right knee degenerative medial meniscus tear with  chondromalacia patella and degenerative arthritis with degenerative tear in  the medial and lateral meniscus and at least grade 3 changes all three  compartments with focal grade 4 changes patellofemoral joint and medial  compartment with reactive synovitis.   PROCEDURE:  Right knee exam under anesthesia, arthroscopy, partial medial  and lateral meniscectomy, chondroplasty patellofemoral joint as well as  medial and lateral compartments.  Partial synovectomy.   SURGEON:  Ninetta Lights, M.D.   ASSISTANT:  Aaron Edelman D. Petrarca, P.A.-C.   ANESTHESIA:  General.   ESTIMATED BLOOD LOSS:  Minimal.   SPECIMENS:  None.   CULTURES:  None.   COMPLICATIONS:  None.   DRESSINGS:  Soft compressive.   PROCEDURE IN DETAIL:  The patient was brought to the operating room and  placed on the operating table in supine position.  After adequate anesthesia  had been obtained the right knee was examined.  Marked patellofemoral  crepitus but stable ligaments.  Lateral tracking but did not have a lot of  tethering patellofemoral joint.  Tourniquet and leg holder applied.  The leg  was prepped and draped in the usual sterile fashion.  Three portals created,  one superolateral, one each medial and lateral peripatellar.  Inflow  catheter introduced.  The knee was extended.  Arthroscope introduced and  knee  inspected.  Grade 3 and 4 chondromalacia throughout the patellofemoral  joint.  Treated with chondroplasty to a stable surface.  Exposed bone over  the lateral patella and some on the trochlea.  Although she tracks laterally  it did not tether enough to warrant lateral release.  Hypertrophic synovitis  throughout debrided.  Cruciate ligaments intact.  Medial compartment  revealed extensive degenerative tearing, posterior half of medial meniscus,  which was saucerized out to the rim and attached to the remaining meniscus.  Grade 3 changes over the entire medial femoral condyle with some focal grade  4 changes posteriorly.  The tibia looked reasonably good.  Uneven cartilage  debrided and loose bodies removed.  The lateral compartment revealed some  grade 2 and 3 changes on the condyle but it looked considerably better than  the medial compartment.  Degenerative tear in middle and anterior third of  the meniscus debrided out until it was smooth.  At completion all recesses  examined, all loose  bodies removed.  Instruments and fluid removed.  Portals and knee injected  with Marcaine.  The portals were closed with 4-0 nylon.  A sterile  compressive dressing applied.  Anesthesia reversed.  Brought to recovery  room.  Tolerated surgery well.  No complications.                                               Ninetta Lights, M.D.    DFM/MEDQ  D:  01/06/2003  T:  01/07/2003  Job:  [id number]

## 2010-12-27 NOTE — Op Note (Signed)
[name], Erin Coffey NO.:  [id number]   MEDICAL RECORD NO.:  [id number]          PATIENT TYPE:  AMB   LOCATION:  [place]                          FACILITY:  [facility]   PHYSICIAN:  Ninetta Lights, M.D. DATE OF BIRTH:  [birth year]   DATE OF PROCEDURE:  07/09/2005  DATE OF DISCHARGE:                                 OPERATIVE REPORT   PREOPERATIVE DIAGNOSIS:  Left knee tricompartmental degenerative arthritis  with medial meniscus tears and loose body.   POSTOPERATIVE DIAGNOSIS:  Left knee tricompartmental degenerative arthritis with medial meniscus tears  and loose body, grade 3 approaching grade 4 chondromalacia, especially  lateral patella, diffuse deep grade 3 chondromalacia weight bearing dome  medial femoral condyle, torn posterior horn medial meniscus, intra-articular  chondral loose bodies, frayed radial tears mid portion of the lateral  meniscus.   PROCEDURE:  Left knee exam under anesthesia, arthroscopy, debridement of  medial and lateral meniscus, removal of loose bodies, chondroplasty of the  medial femoral condyle and patella.   SURGEON:  Ninetta Lights, M.D.   ASSISTANT:  Alyson Locket. Ricard Dillon, P.A.-C.   ANESTHESIA:  General.   ESTIMATED BLOOD LOSS:  Minimal.   TOURNIQUET TIME:  Not employed.   SPECIMENS:  None.   CULTURES:  None.   COMPLICATIONS:  None.   DRESSINGS:  Soft compressive.   PROCEDURE:  The patient was brought to the operating room and after adequate  anesthesia had been obtained, tourniquet and leg holder applied.  Prepped  and draped in the usual sterile fashion.  Three portals were created, one  superolateral and one each medial and lateral parapatellar.  Inflow catheter  introduced, knee distended, arthroscope introduced, knee inspected.  Some  lateral tracking on extreme patellofemoral joint.  Marked grade 3 changes  entire patellofemoral joint, most marked laterally where she was almost bone  on bone lateral half of the  patellofemoral joint.  Chondroplasty throughout.  Numerous chondral loose bodies throughout the knee debrided.  Medial  compartment deep grade 3 changes weight-bearing dome chondroplasty to a  stable surface.  Medial meniscus tear posterior taken down to a stable rim  tapered into remaining meniscus salvaging the anterior half.  The cruciate  ligament is intact.  All recess examined to be sure I could remove all loose  bodies that were seen.  Radial tearing mid portion lateral meniscus  debrided.  Lateral compartment, otherwise, looked reasonably good without  much degenerative change.  At completion, all surfaces examined to be sure  they were smooth.  The instruments and fluid were removed.  The portals of  the knee were injected with Marcaine.  The portals were closed with 4-0  nylon.  Sterile compressive dressing applied.  Anesthesia reversed.  Brought  to the recovery room.  Tolerated the surgery well without complications.      Ninetta Lights, M.D.  Electronically Signed     DFM/MEDQ  D:  07/09/2005  T:  07/09/2005  Job:  [id number]

## 2011-02-21 ENCOUNTER — Other Ambulatory Visit: Payer: Self-pay | Admitting: Family Medicine

## 2011-02-21 NOTE — Telephone Encounter (Signed)
Refill request

## 2011-04-28 ENCOUNTER — Emergency Department (HOSPITAL_COMMUNITY): Payer: Medicare Other

## 2011-04-28 ENCOUNTER — Ambulatory Visit: Payer: Medicare Other | Admitting: Family Medicine

## 2011-04-28 ENCOUNTER — Inpatient Hospital Stay (HOSPITAL_COMMUNITY)
Admission: EM | Admit: 2011-04-28 | Discharge: 2011-05-07 | DRG: 280 | Disposition: A | Discharge status: Home / Self Care | Payer: Medicare Other | Source: Ambulatory Visit | Attending: Cardiology | Admitting: Cardiology

## 2011-04-28 DIAGNOSIS — IMO0002 Reserved for concepts with insufficient information to code with codable children: Secondary | ICD-10-CM | POA: Diagnosis present

## 2011-04-28 DIAGNOSIS — I2692 Saddle embolus of pulmonary artery without acute cor pulmonale: Secondary | ICD-10-CM | POA: Diagnosis present

## 2011-04-28 DIAGNOSIS — I2699 Other pulmonary embolism without acute cor pulmonale: Secondary | ICD-10-CM

## 2011-04-28 DIAGNOSIS — Z7901 Long term (current) use of anticoagulants: Secondary | ICD-10-CM

## 2011-04-28 DIAGNOSIS — E785 Hyperlipidemia, unspecified: Secondary | ICD-10-CM | POA: Diagnosis present

## 2011-04-28 DIAGNOSIS — D509 Iron deficiency anemia, unspecified: Secondary | ICD-10-CM | POA: Diagnosis present

## 2011-04-28 DIAGNOSIS — R079 Chest pain, unspecified: Secondary | ICD-10-CM

## 2011-04-28 DIAGNOSIS — Z79899 Other long term (current) drug therapy: Secondary | ICD-10-CM

## 2011-04-28 DIAGNOSIS — N183 Chronic kidney disease, stage 3 unspecified: Secondary | ICD-10-CM | POA: Diagnosis present

## 2011-04-28 DIAGNOSIS — M1990 Unspecified osteoarthritis, unspecified site: Secondary | ICD-10-CM | POA: Diagnosis present

## 2011-04-28 DIAGNOSIS — E119 Type 2 diabetes mellitus without complications: Secondary | ICD-10-CM | POA: Diagnosis present

## 2011-04-28 DIAGNOSIS — Z8249 Family history of ischemic heart disease and other diseases of the circulatory system: Secondary | ICD-10-CM

## 2011-04-28 DIAGNOSIS — K219 Gastro-esophageal reflux disease without esophagitis: Secondary | ICD-10-CM | POA: Diagnosis present

## 2011-04-28 DIAGNOSIS — R7989 Other specified abnormal findings of blood chemistry: Secondary | ICD-10-CM

## 2011-04-28 DIAGNOSIS — E0590 Thyrotoxicosis, unspecified without thyrotoxic crisis or storm: Secondary | ICD-10-CM | POA: Diagnosis present

## 2011-04-28 DIAGNOSIS — I214 Non-ST elevation (NSTEMI) myocardial infarction: Principal | ICD-10-CM | POA: Diagnosis present

## 2011-04-28 DIAGNOSIS — I129 Hypertensive chronic kidney disease with stage 1 through stage 4 chronic kidney disease, or unspecified chronic kidney disease: Secondary | ICD-10-CM | POA: Diagnosis present

## 2011-04-28 HISTORY — DX: Other pulmonary embolism without acute cor pulmonale: I26.99

## 2011-04-28 LAB — LIPID PANEL
Cholesterol: 186 mg/dL (ref 0–200)
HDL: 69 mg/dL (ref >39)
LDL Cholesterol: 107 mg/dL — ABNORMAL HIGH (ref 0–99)
Total CHOL/HDL Ratio: 2.7 RATIO
Triglycerides: 52 mg/dL (ref <150)
VLDL: 10 mg/dL (ref 0–40)

## 2011-04-28 LAB — DIFFERENTIAL
Basophils Absolute: 0 10*3/uL (ref 0.0–0.1)
Basophils Relative: 0 % (ref 0–1)
Eosinophils Absolute: 0.2 10*3/uL (ref 0.0–0.7)
Eosinophils Relative: 4 % (ref 0–5)
Lymphocytes Relative: 38 % (ref 12–46)
Lymphs Abs: 1.9 10*3/uL (ref 0.7–4.0)
Monocytes Absolute: 0.3 10*3/uL (ref 0.1–1.0)
Monocytes Relative: 7 % (ref 3–12)
Neutro Abs: 2.6 10*3/uL (ref 1.7–7.7)
Neutrophils Relative %: 51 % (ref 43–77)

## 2011-04-28 LAB — COMPREHENSIVE METABOLIC PANEL
ALT: 8 U/L (ref 0–35)
AST: 15 U/L (ref 0–37)
Albumin: 3.1 g/dL — ABNORMAL LOW (ref 3.5–5.2)
Alkaline Phosphatase: 70 U/L (ref 39–117)
BUN: 21 mg/dL (ref 6–23)
CO2: 24 mEq/L (ref 19–32)
Calcium: 9.3 mg/dL (ref 8.4–10.5)
Chloride: 103 mEq/L (ref 96–112)
Creatinine, Ser: 1.44 mg/dL — ABNORMAL HIGH (ref 0.50–1.10)
GFR calc Af Amer: 44 mL/min — ABNORMAL LOW (ref >60)
GFR calc non Af Amer: 37 mL/min — ABNORMAL LOW (ref >60)
Glucose, Bld: 200 mg/dL — ABNORMAL HIGH (ref 70–99)
Potassium: 3.3 mEq/L — ABNORMAL LOW (ref 3.5–5.1)
Sodium: 139 mEq/L (ref 135–145)
Total Bilirubin: 0.3 mg/dL (ref 0.3–1.2)
Total Protein: 8.3 g/dL (ref 6.0–8.3)

## 2011-04-28 LAB — CBC
HCT: 30 % — ABNORMAL LOW (ref 36.0–46.0)
Hemoglobin: 9.8 g/dL — ABNORMAL LOW (ref 12.0–15.0)
MCH: 27.3 pg (ref 26.0–34.0)
MCHC: 32.7 g/dL (ref 30.0–36.0)
MCV: 83.6 fL (ref 78.0–100.0)
Platelets: 218 10*3/uL (ref 150–400)
RBC: 3.59 MIL/uL — ABNORMAL LOW (ref 3.87–5.11)
RDW: 14.9 % (ref 11.5–15.5)
WBC: 5 10*3/uL (ref 4.0–10.5)

## 2011-04-28 LAB — CK TOTAL AND CKMB (NOT AT ARMC)
CK, MB: 4.8 ng/mL — ABNORMAL HIGH (ref 0.3–4.0)
CK, MB: 10.1 ng/mL (ref 0.3–4.0)
Relative Index: 3.5 — ABNORMAL HIGH (ref 0.0–2.5)
Relative Index: 5.6 — ABNORMAL HIGH (ref 0.0–2.5)
Total CK: 136 U/L (ref 7–177)
Total CK: 181 U/L — ABNORMAL HIGH (ref 7–177)

## 2011-04-28 LAB — PROTIME-INR
INR: 1.09 (ref 0.00–1.49)
Prothrombin Time: 14.3 seconds (ref 11.6–15.2)

## 2011-04-28 LAB — HEMOGLOBIN A1C
Hgb A1c MFr Bld: 6.1 % — ABNORMAL HIGH (ref <5.7)
Mean Plasma Glucose: 128 mg/dL — ABNORMAL HIGH (ref <117)

## 2011-04-28 LAB — POCT I-STAT TROPONIN I: Troponin i, poc: 0.6 ng/mL (ref 0.00–0.08)

## 2011-04-28 LAB — GLUCOSE, CAPILLARY
Glucose-Capillary: 120 mg/dL — ABNORMAL HIGH (ref 70–99)
Glucose-Capillary: 136 mg/dL — ABNORMAL HIGH (ref 70–99)
Glucose-Capillary: 144 mg/dL — ABNORMAL HIGH (ref 70–99)

## 2011-04-28 LAB — PRO B NATRIURETIC PEPTIDE: Pro B Natriuretic peptide (BNP): 131 pg/mL — ABNORMAL HIGH (ref 0–125)

## 2011-04-28 LAB — CARDIAC PANEL(CRET KIN+CKTOT+MB+TROPI)
CK, MB: 11.5 ng/mL (ref 0.3–4.0)
Relative Index: 5.9 — ABNORMAL HIGH (ref 0.0–2.5)
Total CK: 194 U/L — ABNORMAL HIGH (ref 7–177)
Troponin I: 3.23 ng/mL (ref <0.30)

## 2011-04-28 LAB — HEPARIN LEVEL (UNFRACTIONATED): Heparin Unfractionated: 0.29 IU/mL — ABNORMAL LOW (ref 0.30–0.70)

## 2011-04-28 LAB — APTT: aPTT: 34 seconds (ref 24–37)

## 2011-04-28 LAB — TSH: TSH: 1.236 u[IU]/mL (ref 0.350–4.500)

## 2011-04-28 LAB — TROPONIN I: Troponin I: 2.13 ng/mL (ref <0.30)

## 2011-04-29 ENCOUNTER — Inpatient Hospital Stay (HOSPITAL_COMMUNITY): Payer: Medicare Other

## 2011-04-29 HISTORY — PX: CARDIAC CATHETERIZATION: SHX172

## 2011-04-29 LAB — CARDIAC PANEL(CRET KIN+CKTOT+MB+TROPI)
CK, MB: 8.2 ng/mL (ref 0.3–4.0)
Relative Index: 4.8 — ABNORMAL HIGH (ref 0.0–2.5)
Total CK: 170 U/L (ref 7–177)
Troponin I: 1.88 ng/mL (ref <0.30)

## 2011-04-29 LAB — CBC
HCT: 28.2 % — ABNORMAL LOW (ref 36.0–46.0)
Hemoglobin: 9.2 g/dL — ABNORMAL LOW (ref 12.0–15.0)
MCH: 27.6 pg (ref 26.0–34.0)
MCHC: 32.6 g/dL (ref 30.0–36.0)
MCV: 84.7 fL (ref 78.0–100.0)
Platelets: 177 10*3/uL (ref 150–400)
RBC: 3.33 MIL/uL — ABNORMAL LOW (ref 3.87–5.11)
RDW: 15.6 % — ABNORMAL HIGH (ref 11.5–15.5)
WBC: 7.1 10*3/uL (ref 4.0–10.5)

## 2011-04-29 LAB — BASIC METABOLIC PANEL
BUN: 23 mg/dL (ref 6–23)
CO2: 23 mEq/L (ref 19–32)
Calcium: 9 mg/dL (ref 8.4–10.5)
Chloride: 107 mEq/L (ref 96–112)
Creatinine, Ser: 1.49 mg/dL — ABNORMAL HIGH (ref 0.50–1.10)
GFR calc Af Amer: 43 mL/min — ABNORMAL LOW (ref >60)
GFR calc non Af Amer: 35 mL/min — ABNORMAL LOW (ref >60)
Glucose, Bld: 146 mg/dL — ABNORMAL HIGH (ref 70–99)
Potassium: 4.6 mEq/L (ref 3.5–5.1)
Sodium: 139 mEq/L (ref 135–145)

## 2011-04-29 LAB — GLUCOSE, CAPILLARY
Glucose-Capillary: 118 mg/dL — ABNORMAL HIGH (ref 70–99)
Glucose-Capillary: 108 mg/dL — ABNORMAL HIGH (ref 70–99)
Glucose-Capillary: 117 mg/dL — ABNORMAL HIGH (ref 70–99)
Glucose-Capillary: 130 mg/dL — ABNORMAL HIGH (ref 70–99)
Glucose-Capillary: 90 mg/dL (ref 70–99)

## 2011-04-29 LAB — D-DIMER, QUANTITATIVE: D-Dimer, Quant: 8.87 ug/mL-FEU — ABNORMAL HIGH (ref 0.00–0.48)

## 2011-04-29 LAB — HEPARIN LEVEL (UNFRACTIONATED): Heparin Unfractionated: 0.25 IU/mL — ABNORMAL LOW (ref 0.30–0.70)

## 2011-04-29 LAB — POCT ACTIVATED CLOTTING TIME: Activated Clotting Time: 111 seconds

## 2011-04-30 ENCOUNTER — Inpatient Hospital Stay (HOSPITAL_COMMUNITY): Payer: Medicare Other

## 2011-04-30 DIAGNOSIS — I214 Non-ST elevation (NSTEMI) myocardial infarction: Secondary | ICD-10-CM

## 2011-04-30 DIAGNOSIS — I369 Nonrheumatic tricuspid valve disorder, unspecified: Secondary | ICD-10-CM

## 2011-04-30 DIAGNOSIS — I2699 Other pulmonary embolism without acute cor pulmonale: Secondary | ICD-10-CM

## 2011-04-30 LAB — CBC
HCT: 26.6 % — ABNORMAL LOW (ref 36.0–46.0)
Hemoglobin: 8.7 g/dL — ABNORMAL LOW (ref 12.0–15.0)
MCH: 27.4 pg (ref 26.0–34.0)
MCHC: 32.7 g/dL (ref 30.0–36.0)
MCV: 83.9 fL (ref 78.0–100.0)
Platelets: 162 10*3/uL (ref 150–400)
RBC: 3.17 MIL/uL — ABNORMAL LOW (ref 3.87–5.11)
RDW: 15.5 % (ref 11.5–15.5)
WBC: 7.4 10*3/uL (ref 4.0–10.5)

## 2011-04-30 LAB — FOLATE: Folate: 11.1 ng/mL

## 2011-04-30 LAB — HEPARIN LEVEL (UNFRACTIONATED)
Heparin Unfractionated: 0.22 IU/mL — ABNORMAL LOW (ref 0.30–0.70)
Heparin Unfractionated: 0.27 IU/mL — ABNORMAL LOW (ref 0.30–0.70)
Heparin Unfractionated: 0.33 IU/mL (ref 0.30–0.70)

## 2011-04-30 LAB — IRON AND TIBC
Iron: 34 ug/dL — ABNORMAL LOW (ref 42–135)
Saturation Ratios: 14 % — ABNORMAL LOW (ref 20–55)
TIBC: 237 ug/dL — ABNORMAL LOW (ref 250–470)
UIBC: 203 ug/dL (ref 125–400)

## 2011-04-30 LAB — BASIC METABOLIC PANEL
BUN: 19 mg/dL (ref 6–23)
CO2: 22 mEq/L (ref 19–32)
Calcium: 8.7 mg/dL (ref 8.4–10.5)
Chloride: 107 mEq/L (ref 96–112)
Creatinine, Ser: 1.39 mg/dL — ABNORMAL HIGH (ref 0.50–1.10)
GFR calc Af Amer: 46 mL/min — ABNORMAL LOW (ref >60)
GFR calc non Af Amer: 38 mL/min — ABNORMAL LOW (ref >60)
Glucose, Bld: 117 mg/dL — ABNORMAL HIGH (ref 70–99)
Potassium: 3.8 mEq/L (ref 3.5–5.1)
Sodium: 139 mEq/L (ref 135–145)

## 2011-04-30 LAB — FERRITIN: Ferritin: 117 ng/mL (ref 10–291)

## 2011-04-30 LAB — GLUCOSE, CAPILLARY
Glucose-Capillary: 135 mg/dL — ABNORMAL HIGH (ref 70–99)
Glucose-Capillary: 85 mg/dL (ref 70–99)
Glucose-Capillary: 87 mg/dL (ref 70–99)
Glucose-Capillary: 104 mg/dL — ABNORMAL HIGH (ref 70–99)

## 2011-04-30 LAB — VITAMIN B12: Vitamin B-12: 301 pg/mL (ref 211–911)

## 2011-04-30 MED ORDER — IOHEXOL 350 MG/ML SOLN
80.0000 mL | Freq: Once | INTRAVENOUS | Status: AC | PRN
  Administered 2011-04-30: 80 mL via INTRAVENOUS

## 2011-05-01 LAB — GLUCOSE, CAPILLARY
Glucose-Capillary: 100 mg/dL — ABNORMAL HIGH (ref 70–99)
Glucose-Capillary: 98 mg/dL (ref 70–99)
Glucose-Capillary: 76 mg/dL (ref 70–99)
Glucose-Capillary: 110 mg/dL — ABNORMAL HIGH (ref 70–99)

## 2011-05-01 LAB — BASIC METABOLIC PANEL
BUN: 15 mg/dL (ref 6–23)
CO2: 22 mEq/L (ref 19–32)
Calcium: 9 mg/dL (ref 8.4–10.5)
Chloride: 107 mEq/L (ref 96–112)
Creatinine, Ser: 1.42 mg/dL — ABNORMAL HIGH (ref 0.50–1.10)
GFR calc Af Amer: 45 mL/min — ABNORMAL LOW (ref >60)
GFR calc non Af Amer: 37 mL/min — ABNORMAL LOW (ref >60)
Glucose, Bld: 100 mg/dL — ABNORMAL HIGH (ref 70–99)
Potassium: 3.7 mEq/L (ref 3.5–5.1)
Sodium: 137 mEq/L (ref 135–145)

## 2011-05-01 LAB — CBC
HCT: 26 % — ABNORMAL LOW (ref 36.0–46.0)
Hemoglobin: 8.6 g/dL — ABNORMAL LOW (ref 12.0–15.0)
MCH: 27.4 pg (ref 26.0–34.0)
MCHC: 33.1 g/dL (ref 30.0–36.0)
MCV: 82.8 fL (ref 78.0–100.0)
Platelets: 150 10*3/uL (ref 150–400)
RBC: 3.14 MIL/uL — ABNORMAL LOW (ref 3.87–5.11)
RDW: 15 % (ref 11.5–15.5)
WBC: 6.7 10*3/uL (ref 4.0–10.5)

## 2011-05-01 LAB — PROTIME-INR
INR: 1.2 (ref 0.00–1.49)
Prothrombin Time: 15.5 seconds — ABNORMAL HIGH (ref 11.6–15.2)

## 2011-05-01 LAB — HEPARIN LEVEL (UNFRACTIONATED): Heparin Unfractionated: 0.38 IU/mL (ref 0.30–0.70)

## 2011-05-01 LAB — OCCULT BLOOD X 1 CARD TO LAB, STOOL
Fecal Occult Bld: NEGATIVE
Fecal Occult Bld: NEGATIVE

## 2011-05-02 DIAGNOSIS — I2699 Other pulmonary embolism without acute cor pulmonale: Secondary | ICD-10-CM

## 2011-05-02 LAB — CBC
HCT: 27 % — ABNORMAL LOW (ref 36.0–46.0)
Hemoglobin: 8.9 g/dL — ABNORMAL LOW (ref 12.0–15.0)
MCH: 27.4 pg (ref 26.0–34.0)
MCHC: 33 g/dL (ref 30.0–36.0)
MCV: 83.1 fL (ref 78.0–100.0)
Platelets: 160 10*3/uL (ref 150–400)
RBC: 3.25 MIL/uL — ABNORMAL LOW (ref 3.87–5.11)
RDW: 14.7 % (ref 11.5–15.5)
WBC: 5.8 10*3/uL (ref 4.0–10.5)

## 2011-05-02 LAB — GLUCOSE, CAPILLARY
Glucose-Capillary: 100 mg/dL — ABNORMAL HIGH (ref 70–99)
Glucose-Capillary: 93 mg/dL (ref 70–99)
Glucose-Capillary: 78 mg/dL (ref 70–99)
Glucose-Capillary: 103 mg/dL — ABNORMAL HIGH (ref 70–99)

## 2011-05-02 LAB — PROTIME-INR
INR: 1.16 (ref 0.00–1.49)
Prothrombin Time: 15 seconds (ref 11.6–15.2)

## 2011-05-02 LAB — BASIC METABOLIC PANEL
BUN: 14 mg/dL (ref 6–23)
CO2: 22 mEq/L (ref 19–32)
Calcium: 9.1 mg/dL (ref 8.4–10.5)
Chloride: 107 mEq/L (ref 96–112)
Creatinine, Ser: 1.43 mg/dL — ABNORMAL HIGH (ref 0.50–1.10)
GFR calc Af Amer: 45 mL/min — ABNORMAL LOW (ref >60)
GFR calc non Af Amer: 37 mL/min — ABNORMAL LOW (ref >60)
Glucose, Bld: 104 mg/dL — ABNORMAL HIGH (ref 70–99)
Potassium: 3.5 mEq/L (ref 3.5–5.1)
Sodium: 137 mEq/L (ref 135–145)

## 2011-05-02 LAB — HEPARIN LEVEL (UNFRACTIONATED): Heparin Unfractionated: 0.32 IU/mL (ref 0.30–0.70)

## 2011-05-03 LAB — GLUCOSE, CAPILLARY
Glucose-Capillary: 94 mg/dL (ref 70–99)
Glucose-Capillary: 87 mg/dL (ref 70–99)
Glucose-Capillary: 91 mg/dL (ref 70–99)
Glucose-Capillary: 105 mg/dL — ABNORMAL HIGH (ref 70–99)

## 2011-05-03 LAB — PROTIME-INR
INR: 1.32 (ref 0.00–1.49)
Prothrombin Time: 16.6 seconds — ABNORMAL HIGH (ref 11.6–15.2)

## 2011-05-03 LAB — HEPARIN LEVEL (UNFRACTIONATED): Heparin Unfractionated: 0.29 IU/mL — ABNORMAL LOW (ref 0.30–0.70)

## 2011-05-04 LAB — CBC
HCT: 25.6 % — ABNORMAL LOW (ref 36.0–46.0)
Hemoglobin: 8.4 g/dL — ABNORMAL LOW (ref 12.0–15.0)
MCH: 27.4 pg (ref 26.0–34.0)
MCHC: 32.8 g/dL (ref 30.0–36.0)
MCV: 83.4 fL (ref 78.0–100.0)
Platelets: 167 10*3/uL (ref 150–400)
RBC: 3.07 MIL/uL — ABNORMAL LOW (ref 3.87–5.11)
RDW: 14.7 % (ref 11.5–15.5)
WBC: 5.4 10*3/uL (ref 4.0–10.5)

## 2011-05-04 LAB — HEPARIN LEVEL (UNFRACTIONATED): Heparin Unfractionated: 0.38 IU/mL (ref 0.30–0.70)

## 2011-05-04 LAB — URINALYSIS, DIPSTICK ONLY
Bilirubin Urine: NEGATIVE
Glucose, UA: NEGATIVE mg/dL
Hgb urine dipstick: NEGATIVE
Ketones, ur: NEGATIVE mg/dL
Nitrite: NEGATIVE
Protein, ur: NEGATIVE mg/dL
Specific Gravity, Urine: 1.012 (ref 1.005–1.030)
Urobilinogen, UA: 1 mg/dL (ref 0.0–1.0)
pH: 6 (ref 5.0–8.0)

## 2011-05-04 LAB — GLUCOSE, CAPILLARY
Glucose-Capillary: 92 mg/dL (ref 70–99)
Glucose-Capillary: 92 mg/dL (ref 70–99)
Glucose-Capillary: 94 mg/dL (ref 70–99)
Glucose-Capillary: 125 mg/dL — ABNORMAL HIGH (ref 70–99)

## 2011-05-04 LAB — PROTIME-INR
INR: 1.55 — ABNORMAL HIGH (ref 0.00–1.49)
Prothrombin Time: 18.9 seconds — ABNORMAL HIGH (ref 11.6–15.2)

## 2011-05-05 LAB — GLUCOSE, CAPILLARY
Glucose-Capillary: 85 mg/dL (ref 70–99)
Glucose-Capillary: 109 mg/dL — ABNORMAL HIGH (ref 70–99)
Glucose-Capillary: 113 mg/dL — ABNORMAL HIGH (ref 70–99)

## 2011-05-05 LAB — HEPARIN LEVEL (UNFRACTIONATED): Heparin Unfractionated: 0.51 IU/mL (ref 0.30–0.70)

## 2011-05-05 LAB — CBC
HCT: 24.9 % — ABNORMAL LOW (ref 36.0–46.0)
Hemoglobin: 8.1 g/dL — ABNORMAL LOW (ref 12.0–15.0)
MCH: 27 pg (ref 26.0–34.0)
MCHC: 32.5 g/dL (ref 30.0–36.0)
MCV: 83 fL (ref 78.0–100.0)
Platelets: 157 10*3/uL (ref 150–400)
RBC: 3 MIL/uL — ABNORMAL LOW (ref 3.87–5.11)
RDW: 14.8 % (ref 11.5–15.5)
WBC: 5.5 10*3/uL (ref 4.0–10.5)

## 2011-05-05 LAB — PROTIME-INR
INR: 1.74 — ABNORMAL HIGH (ref 0.00–1.49)
Prothrombin Time: 20.7 seconds — ABNORMAL HIGH (ref 11.6–15.2)

## 2011-05-06 LAB — CBC
HCT: 25.3 % — ABNORMAL LOW (ref 36.0–46.0)
Hemoglobin: 8.3 g/dL — ABNORMAL LOW (ref 12.0–15.0)
MCH: 27.4 pg (ref 26.0–34.0)
MCHC: 32.8 g/dL (ref 30.0–36.0)
MCV: 83.5 fL (ref 78.0–100.0)
Platelets: 193 10*3/uL (ref 150–400)
RBC: 3.03 MIL/uL — ABNORMAL LOW (ref 3.87–5.11)
RDW: 14.9 % (ref 11.5–15.5)
WBC: 6.2 10*3/uL (ref 4.0–10.5)

## 2011-05-06 LAB — PROTIME-INR
INR: 2.1 — ABNORMAL HIGH (ref 0.00–1.49)
Prothrombin Time: 23.9 seconds — ABNORMAL HIGH (ref 11.6–15.2)

## 2011-05-06 LAB — GLUCOSE, CAPILLARY
Glucose-Capillary: 109 mg/dL — ABNORMAL HIGH (ref 70–99)
Glucose-Capillary: 115 mg/dL — ABNORMAL HIGH (ref 70–99)
Glucose-Capillary: 86 mg/dL (ref 70–99)
Glucose-Capillary: 98 mg/dL (ref 70–99)

## 2011-05-06 LAB — HEPARIN LEVEL (UNFRACTIONATED): Heparin Unfractionated: 0.56 IU/mL (ref 0.30–0.70)

## 2011-05-07 LAB — CBC
HCT: 25.6 % — ABNORMAL LOW (ref 36.0–46.0)
Hemoglobin: 8.4 g/dL — ABNORMAL LOW (ref 12.0–15.0)
MCH: 27.5 pg (ref 26.0–34.0)
MCHC: 32.8 g/dL (ref 30.0–36.0)
MCV: 83.7 fL (ref 78.0–100.0)
Platelets: 194 10*3/uL (ref 150–400)
RBC: 3.06 MIL/uL — ABNORMAL LOW (ref 3.87–5.11)
RDW: 14.9 % (ref 11.5–15.5)
WBC: 6.1 10*3/uL (ref 4.0–10.5)

## 2011-05-07 LAB — HEPARIN LEVEL (UNFRACTIONATED): Heparin Unfractionated: 0.5 IU/mL (ref 0.30–0.70)

## 2011-05-07 LAB — GLUCOSE, CAPILLARY: Glucose-Capillary: 86 mg/dL (ref 70–99)

## 2011-05-07 LAB — PROTIME-INR
INR: 2.7 — ABNORMAL HIGH (ref 0.00–1.49)
Prothrombin Time: 29.1 seconds — ABNORMAL HIGH (ref 11.6–15.2)

## 2011-05-08 ENCOUNTER — Encounter: Payer: Self-pay | Admitting: Family Medicine

## 2011-05-08 ENCOUNTER — Encounter: Payer: Self-pay | Admitting: *Deleted

## 2011-05-08 DIAGNOSIS — Z86711 Personal history of pulmonary embolism: Secondary | ICD-10-CM | POA: Insufficient documentation

## 2011-05-08 DIAGNOSIS — Z7901 Long term (current) use of anticoagulants: Secondary | ICD-10-CM | POA: Insufficient documentation

## 2011-05-08 DIAGNOSIS — I2699 Other pulmonary embolism without acute cor pulmonale: Secondary | ICD-10-CM

## 2011-05-09 ENCOUNTER — Ambulatory Visit (INDEPENDENT_AMBULATORY_CARE_PROVIDER_SITE_OTHER): Payer: Medicare Other | Admitting: *Deleted

## 2011-05-09 DIAGNOSIS — Z7901 Long term (current) use of anticoagulants: Secondary | ICD-10-CM

## 2011-05-09 DIAGNOSIS — I2699 Other pulmonary embolism without acute cor pulmonale: Secondary | ICD-10-CM

## 2011-05-09 LAB — POCT INR: INR: 3.6

## 2011-05-10 LAB — CULTURE, BLOOD (ROUTINE X 2)
Culture  Setup Time: 201209231754
Culture  Setup Time: 201209231754
Culture: NO GROWTH
Culture: NO GROWTH

## 2011-05-13 ENCOUNTER — Ambulatory Visit (INDEPENDENT_AMBULATORY_CARE_PROVIDER_SITE_OTHER): Payer: Medicare Other | Admitting: *Deleted

## 2011-05-13 DIAGNOSIS — I2699 Other pulmonary embolism without acute cor pulmonale: Secondary | ICD-10-CM

## 2011-05-13 DIAGNOSIS — Z7901 Long term (current) use of anticoagulants: Secondary | ICD-10-CM

## 2011-05-13 LAB — POCT INR: INR: 1.8

## 2011-05-15 ENCOUNTER — Ambulatory Visit (INDEPENDENT_AMBULATORY_CARE_PROVIDER_SITE_OTHER): Payer: Medicare Other | Admitting: Family Medicine

## 2011-05-15 ENCOUNTER — Encounter: Payer: Self-pay | Admitting: Family Medicine

## 2011-05-15 VITALS — BP 155/81 | HR 51 | Temp 98.0°F | Ht 68.25 in | Wt 276.1 lb

## 2011-05-15 DIAGNOSIS — Z23 Encounter for immunization: Secondary | ICD-10-CM

## 2011-05-15 DIAGNOSIS — I10 Essential (primary) hypertension: Secondary | ICD-10-CM

## 2011-05-15 DIAGNOSIS — D509 Iron deficiency anemia, unspecified: Secondary | ICD-10-CM

## 2011-05-15 DIAGNOSIS — I2699 Other pulmonary embolism without acute cor pulmonale: Secondary | ICD-10-CM

## 2011-05-15 MED ORDER — TRIAMCINOLONE ACETONIDE 0.1 % EX CREA: TOPICAL_CREAM | Freq: Every day | CUTANEOUS | Status: DC

## 2011-05-15 NOTE — Patient Instructions (Signed)
It was good to see you, I am glad you are feeling better.  Please call the office if your blood pressure at home is greater than [phone] systolic (top number).  [name] will be in contact with me about your INR (coumadin level).  Remember if you are having any bleeding that will not stop, you should seek medical care.

## 2011-05-18 DIAGNOSIS — D509 Iron deficiency anemia, unspecified: Secondary | ICD-10-CM | POA: Insufficient documentation

## 2011-05-18 NOTE — Assessment & Plan Note (Signed)
Continue iron replacement, will re-check hemoglobin next visit.

## 2011-05-18 NOTE — Assessment & Plan Note (Signed)
Symptomatically much improved.  Continue coumadin, adjust per coumadin clinic recs..  Will discuss duration of treatment at next visit (3 months).

## 2011-05-18 NOTE — Assessment & Plan Note (Signed)
Slightly elevated today, but pt reports normal pressures at home.  Pt had medications decreased in hospital due to low bp's.  Will continue to follow, consider increasing again if problems with elevated readings continue.

## 2011-05-18 NOTE — Progress Notes (Signed)
  Subjective:    Patient ID: Erin Coffey, female    DOB: Oct 28, 1945, 65 y.o.   MRN: [id number]  HPI  Pt presents for follow up after hospitalization.  She presented with chest pain and was thought to be having an MI, but cath was negative, and she was found to have a PE.  She was started on coumadin.  Erin Coffey is following in our Coumadin clinic for management.   Patient had problems with low blood pressures while in the hospital and her beta blocker was cut in half.  She reports blood pressures at home [id number] - [id number] systolic.    Overall, patient says she feels much better, denies dyspnea and chest pain.  Pt denies any bleeding or tarry stools. Blood sugars have been well controlled.   Review of Systems See HPI.     Objective:   Physical Exam  BP 155/81  Pulse 51  Temp(Src) 98 F (36.7 C) (Oral)  Ht 5' 8.25" (1.734 m)  Wt 276 lb 1.6 oz (125.238 kg)  BMI 41.67 kg/m2 General appearance: alert, cooperative and no distress Eyes: conjunctivae/corneas clear. PERRL, EOM's intact. Fundi benign. Neck: no adenopathy, no JVD, supple, symmetrical, trachea midline and thyroid not enlarged, symmetric, no tenderness/mass/nodules Back: symmetric, no curvature. ROM normal. No CVA tenderness. Lungs: clear to auscultation bilaterally Heart: regular rate and rhythm, S1, S2 normal, no murmur, click, rub or gallop Abdomen: soft, non-tender; bowel sounds normal; no masses,  no organomegaly Extremities: extremities normal, atraumatic, no cyanosis or edema Pulses: 2+ and symmetric      Assessment & Plan:

## 2011-05-20 ENCOUNTER — Ambulatory Visit (INDEPENDENT_AMBULATORY_CARE_PROVIDER_SITE_OTHER): Payer: Medicare Other | Admitting: *Deleted

## 2011-05-20 DIAGNOSIS — I2699 Other pulmonary embolism without acute cor pulmonale: Secondary | ICD-10-CM

## 2011-05-20 DIAGNOSIS — Z7901 Long term (current) use of anticoagulants: Secondary | ICD-10-CM

## 2011-05-20 LAB — POCT INR: INR: 1.8

## 2011-05-20 NOTE — H&P (Signed)
NAMEMarland Kitchen  Erin Coffey, Erin Coffey NO.:  [id number]  MEDICAL RECORD NO.:  [id number]  LOCATION:  2038                         FACILITY:  [facility]  PHYSICIAN:  Erin Coffey, M.D.  DATE OF BIRTH:  July 19, 1946  DATE OF ADMISSION:  04/28/2011 DATE OF DISCHARGE:                             HISTORY & PHYSICAL   Primary care physician is at the [facility].  PRIMARY CARDIOLOGIST:  None.  CHIEF COMPLAINT:  Chest pain and elevated cardiac enzymes.  HISTORY OF PRESENT ILLNESS:  Erin Coffey is a 65 year old female with no previous history of coronary artery disease.  She was wakened by tachy palpitations this a.m.  She sat up in chair to take her blood pressure which was 110/53 with a heart rate of 62.  When she stood up, she developed dizziness.  She woke up from the floor but denies any injuries.  When she got up, she developed chest pain that she describes as a pressure.  It was a 9/10.  It was associated with diaphoresis, nausea, and shortness of breath.  The palpitations continued.  She contacted EMS.  They gave her aspirin and sublingual nitroglycerin x2 which lessened the pain.  She felt groggy and sleepy and weak.  By arrival in the emergency room, her pain was a 7 or 8/10.  She received morphine 2 mg as well as Zofran 4 mg.  The initial symptoms of chest pain and nausea, etc., resolved.  In the ambulance, she began having left-sided back pain.  She has had this before but all of the other symptoms are new.  She has a remote history of being on Redux and had palpitations years ago with this but she describes those as sharp stabbing type chest pains that were extremely brief and resolved once the medication was discontinued.  She has never had symptoms like the one she had today.  Currently she is resting comfortably.  PAST MEDICAL HISTORY: 1. Hypertension. 2. Diabetes. 3. Morbid obesity. 4. Family history of coronary artery disease. 5. Dyslipidemia with  an elevated LDL. 6. Reflux symptoms. 7. Degenerative joint disease and degenerative disk disease. 8. Hyperthyroidism, status post PTU in 2004. 9. History of Redux use. 10.Chronic kidney disease stage III. 11.History of iron deficiency anemia.  SURGICAL HISTORY:  She is status post bilateral arthroscopic knee surgery and tubal ligation.  ALLERGIES:  LISINOPRIL which cause cough.  CURRENT MEDICATIONS: 1. Diovan HCT 320/25 mg daily. 2. Metformin 500 mg b.i.d. 3. Metoprolol tartrate 100 mg daily. 4. Fish oil 1200 mg 3 days a week. 5. Iron 65 mg 3 days a week. 6. Colace 2 tablets daily p.r.n., approximately 3 days a week. 7. Prilosec OTC fairly often.  SOCIAL HISTORY:  She lives in [place] with family nearby.  She is disabled because of disk disease.  She has no history of alcohol, tobacco, or drug abuse.  She feels that she eats a fairly good diabetic diet and her hemoglobin A1c is usually in the 6 or low 7.  FAMILY HISTORY:  Mother was in her 67s when she died with a history of cardiac enlargement.  Her father was almost 14 when he died and he had a history of  EtOH abuse but also coronary artery disease and siblings have coronary artery disease.  REVIEW OF SYSTEMS:  She had diaphoresis today as well as the palpitations, chest pain, and shortness of breath described above.  She has never had syncope before.  She was alone and no seizure activity was witnessed.  There was no incontinence.  She has chronic dyspnea on exertion but feels that she is able to walk around a lot on flat ground without any difficulty.  She still feels weak.  She has chronic arthralgias and joint pains.  The nausea has resolved and she feels that her reflux symptoms are fairly well controlled.  She has not had any melena.  She has probable chronic constipation.  Full 14 point review of systems is otherwise negative except as stated in the HPI.  PHYSICAL EXAMINATION:  VITAL SIGNS:  Temperature is 97.8,  blood pressure 107/59, heart rate 92, respiratory rate 20, O2 saturation 99% on 2 liters. GENERAL:  She is a well-developed, well-nourished African American female in no acute distress at rest. HEENT:  Normal. NECK:  There is no lymphadenopathy, thyromegaly, bruit, or JVD noted. CV:  Heart is regular in rate and rhythm with an S1, S2 and a systolic murmur is noted at the right upper sternal border and the left lower sternal border.  Distal pulses are intact in all 4 extremities. LUNGS:  Clear to auscultation bilaterally. SKIN:  No rashes or lesions are noted. ABDOMEN:  Soft and nontender with active bowel sounds. EXTREMITIES:  There is no cyanosis, clubbing, or edema noted. MUSCULOSKELETAL:  There is no joint deformity or effusion and no spine or CVA tenderness.  Her chest and left side of her back up are nontender. NEURO:  She is alert and oriented.  Cranial nerves II-XII grossly intact.  CHEST X-RAY:  No acute disease.  EKG:  Sinus rhythm rate 96 with no ST elevation.  An EKG that was performed after her symptoms resolved showed diffuse inferolateral T- wave flattening of unclear significance.  LABORATORY VALUES:  Hemoglobin 9.8, hematocrit 30, WBCs 5.0, platelets 218,000.  Sodium 139, potassium 3.3 (supplemented), chloride 103, CO2 24, BUN 21, creatinine 1.44, glucose 200, GFR 44.  BNP 131, CK-MB 136/4.8 with a repeat pending and troponin I 0.60, repeat pending, INR 1.09.  IMPRESSION:  [name] was seen today by Dr. [name], the patient evaluated and the data reviewed.  She is a 65 year old African American female with a history of diabetes, hypertension, and chronic kidney disease who presents with dizziness and syncope followed by diaphoresis and chest pain.  Her EKG shows no acute changes but initial cardiac enzymes are positive.  We will admit for myocardial infarction rule out. Her Diovan and HCTZ will be held.  She will be gently hydrated.  We will follow her serial  cardiac enzymes and check a lipid profile and TSH as well as hemoglobin A1c.  If her cardiac enzymes are positive, she will need a cardiac catheterization.  The risks and benefits of the procedure were discussed with the patient and her family and she agrees to proceed.  We will check orthostatic vital signs as well.  Her potassium has been supplemented by the ER physician.     Rosaria Ferries, PA-C   ______________________________ Erin Coffey, M.D.    RB/MEDQ  D:  04/28/2011  T:  04/28/2011  Job:  [id number]  Electronically Signed by Rosaria Ferries PA-C on 05/19/2011 06:54:43 AM Electronically Signed by Aahil Fredin Coffey M.D. on 05/20/2011 01:02:09 PM

## 2011-05-20 NOTE — Discharge Summary (Signed)
[name], VULLO NO.:  [id number]  MEDICAL RECORD NO.:  [id number]  LOCATION:  2038                         FACILITY:  [facility]  PHYSICIAN:  Peter M. [name], M.D.  DATE OF BIRTH:  Dec 24, 1945  DATE OF ADMISSION:  04/28/2011 DATE OF DISCHARGE:  05/07/2011                              DISCHARGE SUMMARY   PROCEDURES: 1. Cardiac catheterization. 2. Coronary arteriogram. 3. Two-D echocardiogram. 4. CT angiogram of the chest. 5. CT of the head without contrast. 6. Two-view chest x-ray.  PRIMARY FINAL DISCHARGE DIAGNOSIS:  Saddle pulmonary embolus with additional pulmonary emboli at the bifurcation of the main pulmonary arteries, moderate to large clot burden.  SECONDARY DIAGNOSES: 1. Non-ST-segment elevation myocardial infarction, type 2 with no     significant coronary artery disease at cath. 2. Morbid obesity. 3. Diabetes. 4. Hypertension. 5. Family history of coronary artery disease. 6. Dyslipidemia with an elevated LDL. 7. Gastroesophageal reflux disease. 8. Degenerative joint disease and degenerative disk disease. 9. Hyperthyroidism status post propylthiouracil in 2004 with a TSH     this admission with a normal limits at 1.2-6. 10.Iron deficiency anemia with a hemoglobin of 8.4 and hematocrit 25.6     at discharge. 11.Chronic kidney disease stage III with a BUN of 14, creatinine 1.43,     and GFR of 45 this admission. 12.Status post arthroscopic knee surgery and tubal ligation. 13.Allergy or intolerance to LISINOPRIL with cough.  TIME AT DISCHARGE:  41 minutes.  HOSPITAL COURSE:  Erin Coffey is a 65 year old female with no previous history of coronary artery disease.  She had palpitations and dizziness as well as syncope and chest pain.  She came to the hospital where her initial cardiac enzymes showed some elevation.  She has multiple cardiac risk factors and her initial troponin was elevated.  She was admitted for further evaluation and  treatment.  She ruled in for a non-ST-segment elevation MI with a peak CK-MB of 194/11.5 and a peak troponin of 3.23.  She was taken to the Cath Lab on April 29, 2011.  The cardiac catheterization showed no significant coronary artery disease.  A D-dimer was ordered which was elevated at 8.87.  The next day she had a CT angiogram of the chest which showed subtle pulmonary embolus extending into the lower lobe pulmonary arteries with overall moderate to large clot burden.  She had been anticoagulated with heparin because of the MI.  Coumadin was added to her medication regimen.  She was anemic on admission with a hemoglobin of 9.8 and a hematocrit of 30.  This was followed closely and by discharge she was at 8.4 with a hematocrit of 25.6.  She was iron deficient with an iron level of 34, but her B12 and ferritin levels were within normal limits.  She will receive iron supplementation and follow up with primary care.  She was loaded with Coumadin while the heparin was continued.  By May 06, 2011, she was therapeutic at 2.1 and then on May 07, 2011, she was at 2.7.  An echocardiogram had been done which showed an EF of 55-60% and grade 1 diastolic dysfunction.  There was increased right atrial pressure and a reduction in right  ventricular systolic function with pulmonary arterial pressure mildly to moderately increased.  This will be followed as an outpatient.  Her blood sugars were managed with sliding scale insulin and hemoglobin A1c was 6.1.  She had some fevers and blood cultures were drawn, but these are negative so far.  Her fevers resolved and her white count did not trend up significantly, so no antibiotics were added.  By May 07, 2011, she had fully cross covered with heparin to Coumadin.  She is followed by Dr. Barbra Sarks who stated she would be happy to follow her Coumadin as an outpatient. Dr. [name] felt that Erin Coffey was stable for discharge on  May 07, 2011, in improved condition.  DISCHARGE INSTRUCTIONS: 1. Her activity level is to be increased gradually. 2. She is encouraged to stick to a low-sodium, diabetic diet. 3. She is to call the [place] office for problems with the cath site. 4. She is to get an INR check on May 09, 2011, at 1:30 at the     [facility] and follow up with Dr. Barbra Sarks on     May 15, 2011, at 10 a.m. 5. She is to follow up with Dr. [name] as needed.  DISCHARGE MEDICATIONS: 1. Tylenol OTC 2 tablets q.6 h. p.r.n. 2. Fish oil 1 capsule 3 times a week. 3. Lopressor 100 mg is discontinued. 4. Lopressor 50 mg b.i.d. 5. Metformin 500 mg b.i.d. 6. Colace 100 mg 2 tablets b.i.d. 7. Diovan/hydrochlorothiazide is discontinued. 8. Diovan 160 mg daily. 9. Benadryl 25 mg p.r.n. 10.Iron 65 mg t.i.d. is discontinued. 11.Prilosec OTC daily. 12.Coumadin 5 mg, take as directed.     Rosaria Ferries, PA-C   ______________________________ Peter M. [name], M.D.   RB/MEDQ  D:  05/07/2011  T:  05/07/2011  Job:  [id number]  Electronically Signed by Rosaria Ferries PA-C on 05/19/2011 06:54:08 AM Electronically Signed by PETER [name] M.D. on 05/20/2011 01:02:14 PM

## 2011-05-22 NOTE — Cardiovascular Report (Signed)
  NAMELOYE, HAFEY NO.:  [id number]  MEDICAL RECORD NO.:  [id number]  LOCATION:  2038                         FACILITY:  [facility]  PHYSICIAN:  Loretha Brasil. Lia Foyer, MD, FACCDATE OF BIRTH:  [birth year]  DATE OF PROCEDURE:  04/29/2011 DATE OF DISCHARGE:                           CARDIAC CATHETERIZATION   INDICATIONS:  Erin Coffey is a 65 year old woman who presented with both syncope and positive cardiac enzymes.  There are no definite electrocardiographic changes.  She does have a history of some diabetes and some renal insufficiency.  Her creatinine clearance today was in excess of 60 mL.  She was brought to the catheterization laboratory and we discussed the risks, she had been seen earlier by Dr. [name].  Of note, the patient is anemic.  PROCEDURE: 1. Left heart catheterization. 2. Selective coronary arteriography.  DESCRIPTION OF PROCEDURE:  The procedure was performed from the right femoral artery.  We used a Smart needle to gain access.  4-French catheters were utilized.  Views of left and right coronary arteries were obtained.  A 3DRC was used for the right coronary injection.  Central aortic and left ventricular pressures were measured with pigtail, pullback was performed.  No ventriculography was performed to reduce contrast load.  HEMODYNAMIC DATA: 1. Central aortic pressure 111/72, mean 89. 2. LV pressure 122/18. 3. There was no gradient or pullback across the aortic valve.  ANGIOGRAPHIC DATA: 1. The left main coronary artery is very large in caliber and free of     critical disease. 2. The left anterior descending artery is a large-caliber vessel that     divides distally into a diagonal and an LAD bifurcation, and the     vessel is large in caliber and without significant focal     obstruction. 3. There is a large ramus intermedius.  It divides proximally into 2     moderate sized branches, it is free of critical disease. 4. The circumflex  is a large-caliber vessel that courses posteriorly     and provides a marginal and then provides 2 posterolateral branches     and a posterior descending system.  I am unable to identify high-     grade obstruction in this vessel which is nearly a 6-7 mm vessel     angiographically.  The distal vessel terminates as a PDA and     appears to be free of critical disease.  CONCLUSIONS:  Large caliber coronary vessels without significant evidence of high-grade focal obstruction.  DISPOSITION:  The patient clearly had positive enzymes and syncope.  She has not had a D-dimer.  We will go ahead and get a D-dimer and reevaluate her situation.  If positive, she may require a CT angio to rule out pulmonary embolus.     Loretha Brasil. Lia Foyer, MD, [facility]     TDS/MEDQ  D:  04/29/2011  T:  04/29/2011  Job:  [id number]  cc:   Peter M. [name], M.D. CV Laboratory  Electronically Signed by Bing Quarry MD [facility] on 05/22/2011 05:43:18 AM

## 2011-05-30 ENCOUNTER — Ambulatory Visit (INDEPENDENT_AMBULATORY_CARE_PROVIDER_SITE_OTHER): Payer: Medicare Other | Admitting: *Deleted

## 2011-05-30 DIAGNOSIS — I2699 Other pulmonary embolism without acute cor pulmonale: Secondary | ICD-10-CM

## 2011-05-30 DIAGNOSIS — Z7901 Long term (current) use of anticoagulants: Secondary | ICD-10-CM

## 2011-05-30 LAB — POCT INR: INR: 2.4

## 2011-06-13 ENCOUNTER — Ambulatory Visit (INDEPENDENT_AMBULATORY_CARE_PROVIDER_SITE_OTHER): Payer: Medicare Other | Admitting: *Deleted

## 2011-06-13 DIAGNOSIS — Z7901 Long term (current) use of anticoagulants: Secondary | ICD-10-CM

## 2011-06-13 DIAGNOSIS — I2699 Other pulmonary embolism without acute cor pulmonale: Secondary | ICD-10-CM

## 2011-06-13 LAB — POCT INR: INR: 2.2

## 2011-06-30 ENCOUNTER — Other Ambulatory Visit: Payer: Self-pay | Admitting: Family Medicine

## 2011-06-30 NOTE — Telephone Encounter (Signed)
Refill request

## 2011-07-02 ENCOUNTER — Ambulatory Visit (INDEPENDENT_AMBULATORY_CARE_PROVIDER_SITE_OTHER): Payer: Medicare Other | Admitting: *Deleted

## 2011-07-02 DIAGNOSIS — Z7901 Long term (current) use of anticoagulants: Secondary | ICD-10-CM

## 2011-07-02 DIAGNOSIS — I2699 Other pulmonary embolism without acute cor pulmonale: Secondary | ICD-10-CM

## 2011-07-02 LAB — POCT INR: INR: 1.8

## 2011-07-07 ENCOUNTER — Ambulatory Visit: Payer: Medicare Other

## 2011-07-17 ENCOUNTER — Ambulatory Visit (INDEPENDENT_AMBULATORY_CARE_PROVIDER_SITE_OTHER): Payer: Medicare Other | Admitting: *Deleted

## 2011-07-17 DIAGNOSIS — I2699 Other pulmonary embolism without acute cor pulmonale: Secondary | ICD-10-CM

## 2011-07-17 DIAGNOSIS — Z7901 Long term (current) use of anticoagulants: Secondary | ICD-10-CM

## 2011-07-17 LAB — POCT INR: INR: 2.4

## 2011-07-31 ENCOUNTER — Ambulatory Visit (INDEPENDENT_AMBULATORY_CARE_PROVIDER_SITE_OTHER): Payer: Medicare Other | Admitting: *Deleted

## 2011-07-31 DIAGNOSIS — I2699 Other pulmonary embolism without acute cor pulmonale: Secondary | ICD-10-CM

## 2011-07-31 DIAGNOSIS — Z7901 Long term (current) use of anticoagulants: Secondary | ICD-10-CM

## 2011-07-31 LAB — POCT INR: INR: 2.5

## 2011-08-28 ENCOUNTER — Ambulatory Visit (INDEPENDENT_AMBULATORY_CARE_PROVIDER_SITE_OTHER): Payer: Medicare Other | Admitting: *Deleted

## 2011-08-28 DIAGNOSIS — I2699 Other pulmonary embolism without acute cor pulmonale: Secondary | ICD-10-CM

## 2011-08-28 DIAGNOSIS — Z7901 Long term (current) use of anticoagulants: Secondary | ICD-10-CM

## 2011-08-28 LAB — POCT INR: INR: 2.2

## 2011-09-11 ENCOUNTER — Other Ambulatory Visit: Payer: Self-pay | Admitting: Physician Assistant

## 2011-09-12 ENCOUNTER — Other Ambulatory Visit: Payer: Self-pay | Admitting: Physician Assistant

## 2011-09-15 ENCOUNTER — Other Ambulatory Visit: Payer: Self-pay | Admitting: Family Medicine

## 2011-09-15 MED ORDER — POLYSACCHARIDE IRON COMPLEX 150 MG PO CAPS: 150.0000 mg | ORAL_CAPSULE | Freq: Two times a day (BID) | ORAL | Status: DC

## 2011-09-25 ENCOUNTER — Ambulatory Visit (INDEPENDENT_AMBULATORY_CARE_PROVIDER_SITE_OTHER): Payer: Medicare Other | Admitting: *Deleted

## 2011-09-25 DIAGNOSIS — Z7901 Long term (current) use of anticoagulants: Secondary | ICD-10-CM

## 2011-09-25 DIAGNOSIS — I2699 Other pulmonary embolism without acute cor pulmonale: Secondary | ICD-10-CM

## 2011-09-25 LAB — POCT INR: INR: 2.7

## 2011-10-01 ENCOUNTER — Other Ambulatory Visit: Payer: Self-pay | Admitting: Family Medicine

## 2011-10-02 NOTE — Telephone Encounter (Signed)
Refill request

## 2011-10-07 ENCOUNTER — Encounter: Payer: Self-pay | Admitting: Family Medicine

## 2011-10-07 ENCOUNTER — Ambulatory Visit (INDEPENDENT_AMBULATORY_CARE_PROVIDER_SITE_OTHER): Payer: Medicare Other | Admitting: Family Medicine

## 2011-10-07 ENCOUNTER — Other Ambulatory Visit (HOSPITAL_COMMUNITY)
Admission: RE | Admit: 2011-10-07 | Discharge: 2011-10-07 | Disposition: A | Discharge status: Home / Self Care | Payer: Medicare Other | Source: Ambulatory Visit | Attending: Family Medicine | Admitting: Family Medicine

## 2011-10-07 VITALS — BP 140/72 | HR 58 | Temp 98.7°F | Ht 68.25 in | Wt 277.0 lb

## 2011-10-07 DIAGNOSIS — I10 Essential (primary) hypertension: Secondary | ICD-10-CM

## 2011-10-07 DIAGNOSIS — D509 Iron deficiency anemia, unspecified: Secondary | ICD-10-CM

## 2011-10-07 DIAGNOSIS — E119 Type 2 diabetes mellitus without complications: Secondary | ICD-10-CM

## 2011-10-07 DIAGNOSIS — Z01419 Encounter for gynecological examination (general) (routine) without abnormal findings: Secondary | ICD-10-CM | POA: Insufficient documentation

## 2011-10-07 DIAGNOSIS — Z124 Encounter for screening for malignant neoplasm of cervix: Secondary | ICD-10-CM

## 2011-10-07 LAB — CBC
HCT: 34.9 % — ABNORMAL LOW (ref 36.0–46.0)
Hemoglobin: 10.6 g/dL — ABNORMAL LOW (ref 12.0–15.0)
MCH: 26.5 pg (ref 26.0–34.0)
MCHC: 30.4 g/dL (ref 30.0–36.0)
MCV: 87.3 fL (ref 78.0–100.0)
Platelets: 226 10*3/uL (ref 150–400)
RBC: 4 MIL/uL (ref 3.87–5.11)
RDW: 16.9 % — ABNORMAL HIGH (ref 11.5–15.5)
WBC: 4.4 10*3/uL (ref 4.0–10.5)

## 2011-10-07 LAB — BASIC METABOLIC PANEL
BUN: 16 mg/dL (ref 6–23)
CO2: 25 mEq/L (ref 19–32)
Calcium: 9.1 mg/dL (ref 8.4–10.5)
Chloride: 105 mEq/L (ref 96–112)
Creat: 1.19 mg/dL — ABNORMAL HIGH (ref 0.50–1.10)
Glucose, Bld: 90 mg/dL (ref 70–99)
Potassium: 4.1 mEq/L (ref 3.5–5.3)
Sodium: 140 mEq/L (ref 135–145)

## 2011-10-07 LAB — POCT GLYCOSYLATED HEMOGLOBIN (HGB A1C): Hemoglobin A1C: 5.7

## 2011-10-07 LAB — POCT UA - GLUCOSE/PROTEIN: Glucose, UA: NEGATIVE

## 2011-10-07 NOTE — Assessment & Plan Note (Signed)
Elevated reading in clinic today, but well controlled at home.  Will not make changes today, but will monitor closely.  Will also check bmet.

## 2011-10-07 NOTE — Patient Instructions (Signed)
It was good to see you today.  Your Hemoglobin A1C is  Lab Results  Component Value Date   HGBA1C 5.7 10/07/2011  .  Remember your goal for A1C is less than 7.  Your goal for fasting morning blood sugar is 80-120.  Great Job.   Also, great job on your weight loss.  You weigh 277 pounds, you have lost about 10 pounds in the past year.    Please think about the risks and benefits of continuing coumadin.  We can talk about it more at your next visit.

## 2011-10-07 NOTE — Assessment & Plan Note (Signed)
Pap done today, pt up to date on mammogram.  No abnormalities on exam today.

## 2011-10-07 NOTE — Progress Notes (Signed)
  Subjective:    Patient ID: Erin Coffey, female    DOB: 12/04/1945, 66 y.o.   MRN: [id number]  HPI  Erin Coffey comes in for her Well woman exam and for follow up.  She had her last pap in 2010.  She has never had an abnormal pap.  She says this is going to be her last pap smear.  She says she had her mammogram and it was normal.   BP- she checks it at home and it is usually 120's-130's/ 70's- 80's.  She denies any chest pain, shortness of breath, dyspnea.    DM- she says she is doing well on her current medications, and her blood sugar is almost always under 120 first thing in the morning.  No hypoglycemic episodes.  She is going to the eye doctor soon.   PE- pt still taking coumadin, she has not had much difficulty with it.  She wants to know if it affects her liver or her kidneys.  No blood in stool or urine.  She does say she seems to bruise more easily that before.   Anemia- pt with iron deficiency anemia, causes fatigue.  She is taking her iron, and colace for constipation.  She says she is feeling a bit better from an energy standpoint.   Review of Systems Pertinent items in HPI.     Objective:   Physical Exam BP 140/72  Pulse 58  Temp(Src) 98.7 F (37.1 C) (Oral)  Ht 5' 8.25" (1.734 m)  Wt 277 lb (125.646 kg)  BMI 41.81 kg/m2 General appearance: alert, cooperative and no distress Eyes: conjunctivae/corneas clear. PERRL, EOM's intact. Fundi benign. Neck: no adenopathy, no carotid bruit, no JVD, supple, symmetrical, trachea midline and thyroid not enlarged, symmetric, no tenderness/mass/nodules Lungs: clear to auscultation bilaterally Breasts: normal appearance, no masses or tenderness Heart: regular rate and rhythm, S1, S2 normal, no murmur, click, rub or gallop Abdomen: soft, non-tender; bowel sounds normal; no masses,  no organomegaly Pelvic: cervix normal in appearance, external genitalia normal, no adnexal masses or tenderness, no cervical motion tenderness,  rectovaginal septum normal, vagina normal without discharge and Surgical absence of uterus and ovaries.  Extremities: extremities normal, atraumatic, no cyanosis or edema Pulses: 2+ and symmetric       Assessment & Plan:

## 2011-10-07 NOTE — Assessment & Plan Note (Signed)
Will re-check cbc to monitor.

## 2011-10-07 NOTE — Assessment & Plan Note (Signed)
Well controlled, pt has appt for eye exam.  Will check urine micro albumin. No medication changes.

## 2011-10-21 ENCOUNTER — Ambulatory Visit (INDEPENDENT_AMBULATORY_CARE_PROVIDER_SITE_OTHER): Payer: Medicare Other | Admitting: *Deleted

## 2011-10-21 DIAGNOSIS — I2699 Other pulmonary embolism without acute cor pulmonale: Secondary | ICD-10-CM

## 2011-10-21 DIAGNOSIS — Z7901 Long term (current) use of anticoagulants: Secondary | ICD-10-CM

## 2011-10-21 LAB — POCT INR: INR: 2.1

## 2011-10-31 ENCOUNTER — Encounter: Payer: Self-pay | Admitting: Family Medicine

## 2011-11-20 ENCOUNTER — Ambulatory Visit (INDEPENDENT_AMBULATORY_CARE_PROVIDER_SITE_OTHER): Payer: Medicare Other | Admitting: *Deleted

## 2011-11-20 DIAGNOSIS — Z7901 Long term (current) use of anticoagulants: Secondary | ICD-10-CM

## 2011-11-20 DIAGNOSIS — I2699 Other pulmonary embolism without acute cor pulmonale: Secondary | ICD-10-CM

## 2011-11-20 LAB — POCT INR: INR: 2.1

## 2011-12-18 ENCOUNTER — Ambulatory Visit (INDEPENDENT_AMBULATORY_CARE_PROVIDER_SITE_OTHER): Payer: Medicare Other | Admitting: *Deleted

## 2011-12-18 DIAGNOSIS — I2699 Other pulmonary embolism without acute cor pulmonale: Secondary | ICD-10-CM

## 2011-12-18 DIAGNOSIS — Z7901 Long term (current) use of anticoagulants: Secondary | ICD-10-CM

## 2011-12-18 LAB — POCT INR: INR: 2.1

## 2011-12-29 ENCOUNTER — Telehealth: Payer: Self-pay | Admitting: Family Medicine

## 2011-12-29 NOTE — Telephone Encounter (Signed)
Pt bumped her toe Saturday night and now her toe is burning - not bleeding - is on coumadin and would like to speak to nurse

## 2011-12-29 NOTE — Telephone Encounter (Signed)
Bumped left 4th toe getting out of shower.  No bruising, bleeding, or swelling.  Applied ice for few minutes but unable to tolerate ice.  C/o toe "burning."  Work-in appt scheduled for tomorrow morning with [facility].  Nolene Ebbs, RN

## 2011-12-30 ENCOUNTER — Ambulatory Visit: Payer: Medicare Other

## 2012-01-15 ENCOUNTER — Ambulatory Visit (INDEPENDENT_AMBULATORY_CARE_PROVIDER_SITE_OTHER): Payer: Medicare Other | Admitting: *Deleted

## 2012-01-15 DIAGNOSIS — I2699 Other pulmonary embolism without acute cor pulmonale: Secondary | ICD-10-CM

## 2012-01-15 DIAGNOSIS — Z7901 Long term (current) use of anticoagulants: Secondary | ICD-10-CM

## 2012-01-15 LAB — POCT INR: INR: 2

## 2012-01-28 ENCOUNTER — Encounter: Payer: Self-pay | Admitting: Cardiology

## 2012-02-05 ENCOUNTER — Ambulatory Visit (INDEPENDENT_AMBULATORY_CARE_PROVIDER_SITE_OTHER): Payer: Medicare Other | Admitting: *Deleted

## 2012-02-05 ENCOUNTER — Ambulatory Visit: Payer: Medicare Other

## 2012-02-05 ENCOUNTER — Encounter: Payer: Self-pay | Admitting: Family Medicine

## 2012-02-05 ENCOUNTER — Ambulatory Visit (INDEPENDENT_AMBULATORY_CARE_PROVIDER_SITE_OTHER): Payer: Medicare Other | Admitting: Family Medicine

## 2012-02-05 VITALS — BP 149/76 | HR 58 | Temp 99.1°F | Ht 68.25 in | Wt 280.0 lb

## 2012-02-05 DIAGNOSIS — Z86711 Personal history of pulmonary embolism: Secondary | ICD-10-CM

## 2012-02-05 DIAGNOSIS — E119 Type 2 diabetes mellitus without complications: Secondary | ICD-10-CM

## 2012-02-05 DIAGNOSIS — I2699 Other pulmonary embolism without acute cor pulmonale: Secondary | ICD-10-CM

## 2012-02-05 DIAGNOSIS — Z7901 Long term (current) use of anticoagulants: Secondary | ICD-10-CM

## 2012-02-05 DIAGNOSIS — I10 Essential (primary) hypertension: Secondary | ICD-10-CM

## 2012-02-05 LAB — POCT GLYCOSYLATED HEMOGLOBIN (HGB A1C): Hemoglobin A1C: 5.7

## 2012-02-05 LAB — POCT INR: INR: 2.1

## 2012-02-05 MED ORDER — VALSARTAN 320 MG PO TABS: 320.0000 mg | ORAL_TABLET | Freq: Every day | ORAL | Status: DC

## 2012-02-05 MED ORDER — CYCLOBENZAPRINE HCL 5 MG PO TABS: 5.0000 mg | ORAL_TABLET | Freq: Every evening | ORAL | Status: AC | PRN

## 2012-02-05 NOTE — Assessment & Plan Note (Signed)
Elevated today, and she reports some elevated pressures at home.  Will double diovan and have her come in about 2 weeks for nurse visit for BP check.  I also asked her to bring her wrist cuff to compare.

## 2012-02-05 NOTE — Assessment & Plan Note (Signed)
Patient has been taking coumadin for about 9 months.  Spent the majority of this visit counseling about the risks of chronic coumadin vs. The risk of repeat VTE.  Patient and I plan to decide in about 3 months if she wants to stop taking coumadin after a year of treatment or continue it chronically.

## 2012-02-05 NOTE — Patient Instructions (Signed)
It was good to see you.  Your blood pressure today was BP: 149/76 mmHg.  Remember your goal blood pressure is about 120/80.  Please be sure to take your medication every day.  I am increasing your Diovan.  Please make a nurse visit for two weeks to come in and have your blood pressure checked.   Your Hemoglobin A1C today:  Lab Results  Component Value Date   HGBA1C 5.7 02/05/2012  Great Job!  Please come back and see me in about 3 months and we will talk about whether or not you want to stop taking coumadin or not.

## 2012-02-05 NOTE — Progress Notes (Signed)
  Subjective:    Patient ID: Erin Coffey, female    DOB: August 07, 1946, 66 y.o.   MRN: [id number]  HPI  Erin Coffey comes in for follow up  HTN- she says she has a wrist cuff at home and it ranges from 130/80-150/90.  Denies dyspnea, palpitations, chest pain, LE edema.  No dizziness.  Took BP medications today.    DM- taking metformin, no hypo or hypoglycemic episodes that she knows of.  Does not check her blood sugar regularly.  No polyuria or polydypsia.   Chronic coumadin use due to PE last September- taking coumadin without much difficulty.  Never had stomach ulcer or GI bleed.  She has only had one PE, and has no family history of clotting disorders.   Past Medical History  Diagnosis Date  . Allergy   . Arthritis   . Anemia, iron deficiency   . Diabetes mellitus   . GERD (gastroesophageal reflux disease)   . Hypertension   . Chronic kidney disease   . Thyroid disease   . Pulmonary embolism 04/28/11   Family History  Problem Relation Age of Onset  . Diabetes Mother   . Heart disease Mother   . Hypertension Mother   . Heart disease Father   . Hypertension Father   . Cancer Sister   . Diabetes Sister   . Hypertension Brother    History  Substance Use Topics  . Smoking status: Never Smoker   . Smokeless tobacco: Never Used  . Alcohol Use: No    Review of Systems Pertinent items in HPI.     Objective:   Physical Exam BP 149/76  Pulse 58  Temp 99.1 F (37.3 C) (Oral)  Ht 5' 8.25" (1.734 m)  Wt 280 lb (127.007 kg)  BMI 42.26 kg/m2 General appearance: alert, cooperative and no distress Throat: lips, mucosa, and tongue normal; teeth and gums normal Lungs: clear to auscultation bilaterally Heart: regular rate and rhythm, S1, S2 normal, no murmur, click, rub or gallop Abdomen: soft, non-tender; bowel sounds normal; no masses,  no organomegaly Extremities: extremities normal, atraumatic, no cyanosis or edema Pulses: 2+ and symmetric       Assessment & Plan:

## 2012-02-05 NOTE — Assessment & Plan Note (Signed)
Well controlled, continue metformin.

## 2012-03-04 ENCOUNTER — Ambulatory Visit (INDEPENDENT_AMBULATORY_CARE_PROVIDER_SITE_OTHER): Payer: Medicare Other | Admitting: *Deleted

## 2012-03-04 DIAGNOSIS — Z7901 Long term (current) use of anticoagulants: Secondary | ICD-10-CM

## 2012-03-04 DIAGNOSIS — I2699 Other pulmonary embolism without acute cor pulmonale: Secondary | ICD-10-CM

## 2012-03-04 DIAGNOSIS — Z86711 Personal history of pulmonary embolism: Secondary | ICD-10-CM

## 2012-03-04 LAB — POCT INR: INR: 2

## 2012-03-17 ENCOUNTER — Telehealth: Payer: Self-pay | Admitting: Family Medicine

## 2012-03-17 NOTE — Telephone Encounter (Signed)
Attempted call back. Message left on voicemail to return call.

## 2012-03-17 NOTE — Telephone Encounter (Signed)
Patient is calling because she has dropped a heavy can on her foot and she has injured her toe.  She is concerned because she is on blood thinner and she doesn't know if she needs to be seen.

## 2012-03-17 NOTE — Telephone Encounter (Signed)
Spoke with patient .  Denies any change of color , redness, swelling. She is able to move toe as usual.  States she does have normal feeling in her feet.  Advised to watch for changes and call back if any changes noted. Consulted with Dr. Lindell Noe.

## 2012-04-01 ENCOUNTER — Ambulatory Visit (INDEPENDENT_AMBULATORY_CARE_PROVIDER_SITE_OTHER): Payer: Medicare Other | Admitting: *Deleted

## 2012-04-01 DIAGNOSIS — Z7901 Long term (current) use of anticoagulants: Secondary | ICD-10-CM

## 2012-04-01 DIAGNOSIS — Z86711 Personal history of pulmonary embolism: Secondary | ICD-10-CM

## 2012-04-01 DIAGNOSIS — I2699 Other pulmonary embolism without acute cor pulmonale: Secondary | ICD-10-CM

## 2012-04-01 LAB — POCT INR: INR: 2.3

## 2012-04-05 ENCOUNTER — Other Ambulatory Visit: Payer: Self-pay | Admitting: Family Medicine

## 2012-04-29 ENCOUNTER — Encounter: Payer: Self-pay | Admitting: Family Medicine

## 2012-04-29 ENCOUNTER — Ambulatory Visit (INDEPENDENT_AMBULATORY_CARE_PROVIDER_SITE_OTHER): Payer: Medicare Other | Admitting: Family Medicine

## 2012-04-29 ENCOUNTER — Ambulatory Visit (INDEPENDENT_AMBULATORY_CARE_PROVIDER_SITE_OTHER): Payer: Medicare Other | Admitting: *Deleted

## 2012-04-29 VITALS — BP 176/76 | HR 70 | Temp 99.1°F | Ht 68.25 in | Wt 285.0 lb

## 2012-04-29 DIAGNOSIS — Z86711 Personal history of pulmonary embolism: Secondary | ICD-10-CM

## 2012-04-29 DIAGNOSIS — E669 Obesity, unspecified: Secondary | ICD-10-CM

## 2012-04-29 DIAGNOSIS — E119 Type 2 diabetes mellitus without complications: Secondary | ICD-10-CM

## 2012-04-29 DIAGNOSIS — I10 Essential (primary) hypertension: Secondary | ICD-10-CM

## 2012-04-29 DIAGNOSIS — Z23 Encounter for immunization: Secondary | ICD-10-CM

## 2012-04-29 DIAGNOSIS — Z7901 Long term (current) use of anticoagulants: Secondary | ICD-10-CM

## 2012-04-29 DIAGNOSIS — I2699 Other pulmonary embolism without acute cor pulmonale: Secondary | ICD-10-CM

## 2012-04-29 DIAGNOSIS — E785 Hyperlipidemia, unspecified: Secondary | ICD-10-CM

## 2012-04-29 DIAGNOSIS — N19 Unspecified kidney failure: Secondary | ICD-10-CM

## 2012-04-29 LAB — POCT INR: INR: 2.6

## 2012-04-29 LAB — POCT GLYCOSYLATED HEMOGLOBIN (HGB A1C): Hemoglobin A1C: 5.8

## 2012-04-29 MED ORDER — CYCLOBENZAPRINE HCL 5 MG PO TABS: 5.0000 mg | ORAL_TABLET | Freq: Three times a day (TID) | ORAL | Status: DC | PRN

## 2012-04-29 MED ORDER — METOPROLOL TARTRATE 50 MG PO TABS: 50.0000 mg | ORAL_TABLET | Freq: Two times a day (BID) | ORAL | Status: DC

## 2012-04-29 MED ORDER — TETANUS-DIPHTH-ACELL PERTUSSIS 5-2.5-18.5 LF-MCG/0.5 IM SUSP: 0.5000 mL | Freq: Once | INTRAMUSCULAR | Status: DC

## 2012-04-29 NOTE — Assessment & Plan Note (Signed)
LDL last year was 107, goal is <100.  Will re-check fasting lipids.

## 2012-04-29 NOTE — Patient Instructions (Signed)
It was good to see you today.  Your Hemoglobin A1C is  Lab Results  Component Value Date   HGBA1C 5.8 04/29/2012  .  Remember your goal for A1C is less than 7.  Your goal for fasting morning blood sugar is 80-120.  Great Job.   Your repeat blood pressure today was 138/72.  Please check your blood pressure three times a week, and if it is running above 140/90 on a regular basis, please call the office for an appointment.   For your lab work, please make a lab appointment at the front desk, and when you come to have the labs draw, please do not eat or drink anything (but water) for 8 hours before the appointment.

## 2012-04-29 NOTE — Assessment & Plan Note (Signed)
Initial reading elevated, but re-check was 138/72.  She has been well controlled at home.  I will not make BP medication changes today, but have asked her to check BP's regularly, contact me if consistently above 140/90.

## 2012-04-29 NOTE — Assessment & Plan Note (Signed)
Cr. Has been stable around 1.8- will check renal function with lab draw.

## 2012-04-29 NOTE — Progress Notes (Signed)
Patient ID: Erin Coffey, female   DOB: 05/22/1946, 66 y.o.   MRN: [id number]  Subjective:    HPI:   Patient comes in for follow up of chronic medical problems  Hx of PE, on coumadin: Ki just passed the year anniversary of being hospitalized for a pulmonary embolus.  She has been taking coumadin since then.  This was her first blood clot, and she does not have a family history of blood clots.  She and I have discussed whether or not she should stop taking the coumadin after a year of anticoagulation over her past few check ups.  She says today that she is not having any problems with the coumadin, and the thought of stopping it makes her nervous.  She asks if the coumadin with cause any liver or kidney problems.    Palpitations: she says this has been going on for mor than a year.  She has occasional episodes that her heart races for a few minutes.  She says they can start when she is lying down in bed, are not brought on by anything in particular, nor are they relived by any thing except sometimes taking a deep breath helps.  She denies any associated chest pain, headaches, vision changes.   DM: Patient is taking metformin.  Patient is checking blood sugars.  Fasting sugars range from 80 to 124.  No hyper or hypoglycemic episodes, no polyuria or polydypisa.  Saw eye doctor last June, they reported no problems.   HTN: Taking metoprolol and diovan without difficulty.  Denies chest pain, dizziness, LE edema.  Patient does check blood pressure, they run around 130/75 at home.   HLD- not taking medications, LDL last year was 107, patient has been trying to do life-style modifications.   Past Medical History  Diagnosis Date  . Allergy   . Arthritis   . Anemia, iron deficiency   . Diabetes mellitus   . GERD (gastroesophageal reflux disease)   . Hypertension   . Chronic kidney disease   . Thyroid disease   . Pulmonary embolism 04/28/11   Family History  Problem Relation Age of Onset   . Diabetes Mother   . Heart disease Mother   . Hypertension Mother   . Heart disease Father   . Hypertension Father   . Cancer Sister   . Diabetes Sister   . Hypertension Brother    History  Substance Use Topics  . Smoking status: Never Smoker   . Smokeless tobacco: Never Used  . Alcohol Use: No    ROS:  Negative except stated in HPI    Objective:  Physical Exam:  BP 176/76  Pulse 70  Temp 99.1 F (37.3 C) (Oral)  Ht 5' 8.25" (1.734 m)  Wt 285 lb (129.275 kg)  BMI 43.02 kg/m2 General appearance: alert, cooperative and no distress Neck: no adenopathy,supple, symmetrical, trachea midline and thyroid not enlarged, symmetric, no tenderness/mass/nodules Lungs: clear to auscultation bilaterally Heart: regular rate and rhythm, S1, S2 normal, no murmur, click, rub or gallop Extremities: extremities normal, atraumatic, no cyanosis or edema Pulses: 2+ and symmetric       Assessment & Plan:

## 2012-04-29 NOTE — Assessment & Plan Note (Signed)
Relatively stable, but considering risk for metabolic syndrome, will check CMET ensure not fatty liver changes.

## 2012-04-29 NOTE — Assessment & Plan Note (Signed)
Well controlled on metformin, no changes to medications.

## 2012-04-30 LAB — COMPREHENSIVE METABOLIC PANEL
ALT: 9 U/L (ref 0–35)
AST: 15 U/L (ref 0–37)
Albumin: 3.6 g/dL (ref 3.5–5.2)
Alkaline Phosphatase: 61 U/L (ref 39–117)
BUN: 14 mg/dL (ref 6–23)
CO2: 27 mEq/L (ref 19–32)
Calcium: 9.1 mg/dL (ref 8.4–10.5)
Chloride: 107 mEq/L (ref 96–112)
Creat: 1.18 mg/dL — ABNORMAL HIGH (ref 0.50–1.10)
Glucose, Bld: 73 mg/dL (ref 70–99)
Potassium: 4.3 mEq/L (ref 3.5–5.3)
Sodium: 140 mEq/L (ref 135–145)
Total Bilirubin: 0.4 mg/dL (ref 0.3–1.2)
Total Protein: 7.6 g/dL (ref 6.0–8.3)

## 2012-05-03 ENCOUNTER — Encounter: Payer: Self-pay | Admitting: Family Medicine

## 2012-05-04 ENCOUNTER — Other Ambulatory Visit: Payer: Self-pay | Admitting: Physician Assistant

## 2012-05-06 ENCOUNTER — Other Ambulatory Visit: Payer: Medicare Other

## 2012-05-06 DIAGNOSIS — E669 Obesity, unspecified: Secondary | ICD-10-CM

## 2012-05-06 DIAGNOSIS — E119 Type 2 diabetes mellitus without complications: Secondary | ICD-10-CM

## 2012-05-06 NOTE — Progress Notes (Signed)
FLP DONE TODAY Erin Coffey 

## 2012-05-07 ENCOUNTER — Encounter: Payer: Self-pay | Admitting: Family Medicine

## 2012-05-07 LAB — LIPID PANEL
Cholesterol: 188 mg/dL (ref 0–200)
HDL: 72 mg/dL (ref >39)
LDL Cholesterol: 106 mg/dL — ABNORMAL HIGH (ref 0–99)
Total CHOL/HDL Ratio: 2.6 Ratio
Triglycerides: 51 mg/dL (ref <150)
VLDL: 10 mg/dL (ref 0–40)

## 2012-05-27 ENCOUNTER — Ambulatory Visit (INDEPENDENT_AMBULATORY_CARE_PROVIDER_SITE_OTHER): Payer: Medicare Other | Admitting: *Deleted

## 2012-05-27 ENCOUNTER — Ambulatory Visit: Payer: Medicare Other | Admitting: Family Medicine

## 2012-05-27 DIAGNOSIS — Z86711 Personal history of pulmonary embolism: Secondary | ICD-10-CM

## 2012-05-27 DIAGNOSIS — Z7901 Long term (current) use of anticoagulants: Secondary | ICD-10-CM

## 2012-05-27 LAB — POCT INR: INR: 3.2

## 2012-06-10 ENCOUNTER — Ambulatory Visit (INDEPENDENT_AMBULATORY_CARE_PROVIDER_SITE_OTHER): Payer: Medicare Other | Admitting: *Deleted

## 2012-06-10 DIAGNOSIS — Z7901 Long term (current) use of anticoagulants: Secondary | ICD-10-CM

## 2012-06-10 DIAGNOSIS — Z86711 Personal history of pulmonary embolism: Secondary | ICD-10-CM

## 2012-06-10 LAB — POCT INR: INR: 2.6

## 2012-06-24 ENCOUNTER — Encounter: Payer: Self-pay | Admitting: Family Medicine

## 2012-06-24 ENCOUNTER — Ambulatory Visit (INDEPENDENT_AMBULATORY_CARE_PROVIDER_SITE_OTHER): Payer: Medicare Other | Admitting: Family Medicine

## 2012-06-24 VITALS — BP 177/74 | HR 66 | Temp 98.1°F | Ht 68.25 in | Wt 290.0 lb

## 2012-06-24 DIAGNOSIS — R1011 Right upper quadrant pain: Secondary | ICD-10-CM

## 2012-06-24 DIAGNOSIS — Z7901 Long term (current) use of anticoagulants: Secondary | ICD-10-CM

## 2012-06-24 LAB — CBC WITH DIFFERENTIAL/PLATELET
Basophils Absolute: 0 10*3/uL (ref 0.0–0.1)
Basophils Relative: 0 % (ref 0–1)
Eosinophils Absolute: 0.1 10*3/uL (ref 0.0–0.7)
Eosinophils Relative: 3 % (ref 0–5)
HCT: 33.3 % — ABNORMAL LOW (ref 36.0–46.0)
Hemoglobin: 11 g/dL — ABNORMAL LOW (ref 12.0–15.0)
Lymphocytes Relative: 42 % (ref 12–46)
Lymphs Abs: 1.9 10*3/uL (ref 0.7–4.0)
MCH: 27.4 pg (ref 26.0–34.0)
MCHC: 33 g/dL (ref 30.0–36.0)
MCV: 82.8 fL (ref 78.0–100.0)
Monocytes Absolute: 0.5 10*3/uL (ref 0.1–1.0)
Monocytes Relative: 12 % (ref 3–12)
Neutro Abs: 1.9 10*3/uL (ref 1.7–7.7)
Neutrophils Relative %: 43 % (ref 43–77)
Platelets: 222 10*3/uL (ref 150–400)
RBC: 4.02 MIL/uL (ref 3.87–5.11)
RDW: 16.1 % — ABNORMAL HIGH (ref 11.5–15.5)
WBC: 4.5 10*3/uL (ref 4.0–10.5)

## 2012-06-24 LAB — COMPREHENSIVE METABOLIC PANEL
ALT: 11 U/L (ref 0–35)
AST: 16 U/L (ref 0–37)
Albumin: 3.9 g/dL (ref 3.5–5.2)
Alkaline Phosphatase: 69 U/L (ref 39–117)
BUN: 14 mg/dL (ref 6–23)
CO2: 25 mEq/L (ref 19–32)
Calcium: 9.4 mg/dL (ref 8.4–10.5)
Chloride: 107 mEq/L (ref 96–112)
Creat: 1.26 mg/dL — ABNORMAL HIGH (ref 0.50–1.10)
Glucose, Bld: 129 mg/dL — ABNORMAL HIGH (ref 70–99)
Potassium: 4.5 mEq/L (ref 3.5–5.3)
Sodium: 139 mEq/L (ref 135–145)
Total Bilirubin: 0.4 mg/dL (ref 0.3–1.2)
Total Protein: 8.2 g/dL (ref 6.0–8.3)

## 2012-06-24 LAB — POCT URINALYSIS DIPSTICK
Bilirubin, UA: NEGATIVE
Ketones, UA: NEGATIVE
Nitrite, UA: NEGATIVE
Protein, UA: 30
Spec Grav, UA: 1.02
Urobilinogen, UA: 0.2
pH, UA: 6

## 2012-06-24 LAB — POCT UA - MICROSCOPIC ONLY

## 2012-06-24 NOTE — Assessment & Plan Note (Signed)
Continue coumadin right now, will have to d/c if she needs surgery.

## 2012-06-24 NOTE — Patient Instructions (Signed)
I am sorry your side is still hurting.  I am going to get some lab work and an ultrasound to find out what is going on.  I will call you next week after the ultrasound is done so we can discuss the results.

## 2012-06-24 NOTE — Assessment & Plan Note (Signed)
Pt with known history of gall stones, she is afebrile with normal vital signs, does not appear to be acutely ill with Cholecystitis.   - will check UA for blood to decrease concern for kidney stone - will check CMET and CBC to look for signs of cholecystitis - will repeat RUQ [name] as last one was in 2011 - Consider referral to surgery pending results.  I did spend some time discussing indications for gall bladder removal and risks of surgery as this patient is on coumadin for hx of PE and also has HTN, HLD, DM.  Fortunately she does not have hx of CAD.

## 2012-06-24 NOTE — Progress Notes (Signed)
  Subjective:    Patient ID: Erin Coffey, female    DOB: 10/31/1945, 66 y.o.   MRN: [id number]  HPI  Ms Rumple comes in for right upper quadrant pain that radiates around her flank.  It has been going on for at least a month, but she has had several episodes in the past too.  She says that a few weeks ago she had some nausea and one episode of vomiting, but denies bilious vomiting.  She says the pain seems worse at night when she lays down to go to bed, but it is now constant ache throughout the day, and she has intermittent sharp stabbing pains too.  She had an [name] in 2011 that showed gall stones, but no cholecystitis or choledocholithiasis.  She says the pain is worse if she eats greasy foods.    Past Medical History  Diagnosis Date  . Allergy   . Arthritis   . Anemia, iron deficiency   . Diabetes mellitus   . GERD (gastroesophageal reflux disease)   . Hypertension   . Chronic kidney disease   . Thyroid disease   . Pulmonary embolism 04/28/11   Past Surgical History  Procedure Date  . Abdominal hysterectomy   . Tubal ligation   . Knee arthroscopy Bilateral  . Cardiac catheterization 04/29/11    Clean cardiac cath, no blockages.    Family History  Problem Relation Age of Onset  . Diabetes Mother   . Heart disease Mother   . Hypertension Mother   . Heart disease Father   . Hypertension Father   . Cancer Sister   . Diabetes Sister   . Hypertension Brother    History  Substance Use Topics  . Smoking status: Never Smoker   . Smokeless tobacco: Never Used  . Alcohol Use: No     Review of Systems See HPI    Objective:   Physical Exam  BP 177/74  Pulse 66  Temp 98.1 F (36.7 C) (Oral)  Ht 5' 8.25" (1.734 m)  Wt 290 lb (131.543 kg)  BMI 43.77 kg/m2 General appearance: alert, cooperative and no distress Lungs: clear to auscultation bilaterally Heart: RRR, no m/r/g Back: No CVA tenderness Abdomen: +BS, soft, mild RUQ pain, no rebound or guarding  Extremities:  extremities normal, atraumatic, no cyanosis or edema Pulses: 2+ and symmetric       Assessment & Plan:

## 2012-06-25 ENCOUNTER — Ambulatory Visit (HOSPITAL_COMMUNITY)
Admission: RE | Admit: 2012-06-25 | Discharge: 2012-06-25 | Disposition: A | Discharge status: Home / Self Care | Payer: Medicare Other | Source: Ambulatory Visit | Attending: Family Medicine | Admitting: Family Medicine

## 2012-06-25 ENCOUNTER — Telehealth: Payer: Self-pay | Admitting: Family Medicine

## 2012-06-25 DIAGNOSIS — R1011 Right upper quadrant pain: Secondary | ICD-10-CM

## 2012-06-25 DIAGNOSIS — K8020 Calculus of gallbladder without cholecystitis without obstruction: Secondary | ICD-10-CM | POA: Insufficient documentation

## 2012-06-25 DIAGNOSIS — D739 Disease of spleen, unspecified: Secondary | ICD-10-CM | POA: Insufficient documentation

## 2012-06-25 DIAGNOSIS — K7689 Other specified diseases of liver: Secondary | ICD-10-CM | POA: Insufficient documentation

## 2012-06-25 HISTORY — DX: Calculus of gallbladder without cholecystitis without obstruction: K80.20

## 2012-06-25 NOTE — Telephone Encounter (Signed)
Called Erin Coffey to discuss results of abdominal US and lab tests.  Told her she does still have gall stones, and they are likely causing her pain.  However, no signs of infection or blockage of biliary tree.  Told her if she wants to I can refer her to surgery to discuss elective gall bladder removal, but we would have to stop her coumadin for that.  She says that the pain is intermittent and she feels she can deal with it, she does not want to have surgery if she does not have to, she just wanted to make sure nothing dangerous was going on.    I reviewed red flags that might mean her gall bladder is infected or there is a blockage: persistent nausea/vomiting, bilious vomiting, fevers, increasing abdominal pain.  She voices understanding.   Kaikoa Magro 06/25/2012 10:41 AM

## 2012-07-04 ENCOUNTER — Other Ambulatory Visit: Payer: Self-pay | Admitting: Family Medicine

## 2012-07-15 ENCOUNTER — Ambulatory Visit (INDEPENDENT_AMBULATORY_CARE_PROVIDER_SITE_OTHER): Payer: Medicare Other | Admitting: *Deleted

## 2012-07-15 DIAGNOSIS — Z7901 Long term (current) use of anticoagulants: Secondary | ICD-10-CM

## 2012-07-15 DIAGNOSIS — Z86711 Personal history of pulmonary embolism: Secondary | ICD-10-CM

## 2012-07-15 LAB — POCT INR: INR: 2.8

## 2012-07-26 ENCOUNTER — Telehealth: Payer: Self-pay | Admitting: Family Medicine

## 2012-07-26 MED ORDER — CYCLOBENZAPRINE HCL 5 MG PO TABS: 5.0000 mg | ORAL_TABLET | Freq: Three times a day (TID) | ORAL | Status: DC | PRN

## 2012-07-26 NOTE — Telephone Encounter (Signed)
Pt went to [place] last night and forgot her muscle relaxer pills, wants to know if we can call in script - she is there for 2 weeks  [facility] - [address] [place], [place]

## 2012-07-26 NOTE — Telephone Encounter (Signed)
Advised pt of med sent to pharmacy

## 2012-07-26 NOTE — Telephone Encounter (Signed)
Rx sent to pharmacy   

## 2012-08-19 ENCOUNTER — Ambulatory Visit (INDEPENDENT_AMBULATORY_CARE_PROVIDER_SITE_OTHER): Payer: Medicare Other | Admitting: *Deleted

## 2012-08-19 DIAGNOSIS — Z86711 Personal history of pulmonary embolism: Secondary | ICD-10-CM

## 2012-08-19 DIAGNOSIS — Z7901 Long term (current) use of anticoagulants: Secondary | ICD-10-CM

## 2012-08-19 LAB — POCT INR: INR: 2.7

## 2012-09-23 ENCOUNTER — Ambulatory Visit: Payer: Medicare Other | Admitting: *Deleted

## 2012-09-23 NOTE — Progress Notes (Signed)
Encounter opened in error [facility], MLS

## 2012-10-01 ENCOUNTER — Telehealth: Payer: Self-pay | Admitting: Family Medicine

## 2012-10-01 ENCOUNTER — Encounter: Payer: Self-pay | Admitting: Family Medicine

## 2012-10-01 ENCOUNTER — Ambulatory Visit (INDEPENDENT_AMBULATORY_CARE_PROVIDER_SITE_OTHER): Payer: Medicare Other | Admitting: *Deleted

## 2012-10-01 ENCOUNTER — Ambulatory Visit (INDEPENDENT_AMBULATORY_CARE_PROVIDER_SITE_OTHER): Payer: Medicare Other | Admitting: Family Medicine

## 2012-10-01 VITALS — BP 150/73 | HR 57 | Temp 99.1°F | Ht 69.0 in | Wt 291.2 lb

## 2012-10-01 DIAGNOSIS — R002 Palpitations: Secondary | ICD-10-CM | POA: Insufficient documentation

## 2012-10-01 DIAGNOSIS — R1011 Right upper quadrant pain: Secondary | ICD-10-CM

## 2012-10-01 DIAGNOSIS — K8020 Calculus of gallbladder without cholecystitis without obstruction: Secondary | ICD-10-CM

## 2012-10-01 DIAGNOSIS — D509 Iron deficiency anemia, unspecified: Secondary | ICD-10-CM

## 2012-10-01 DIAGNOSIS — E119 Type 2 diabetes mellitus without complications: Secondary | ICD-10-CM

## 2012-10-01 DIAGNOSIS — I10 Essential (primary) hypertension: Secondary | ICD-10-CM

## 2012-10-01 DIAGNOSIS — E785 Hyperlipidemia, unspecified: Secondary | ICD-10-CM

## 2012-10-01 DIAGNOSIS — Z86711 Personal history of pulmonary embolism: Secondary | ICD-10-CM

## 2012-10-01 DIAGNOSIS — Z7901 Long term (current) use of anticoagulants: Secondary | ICD-10-CM

## 2012-10-01 LAB — POCT INR: INR: 2.9

## 2012-10-01 LAB — POCT GLYCOSYLATED HEMOGLOBIN (HGB A1C): Hemoglobin A1C: 6

## 2012-10-01 NOTE — Patient Instructions (Signed)
It was good to see you today.  Your Hemoglobin A1C is  Lab Results  Component Value Date   HGBA1C 6.0 10/01/2012  .  Remember your goal for A1C is less than 7.  Your goal for fasting morning blood sugar is 80-120.  Great Job!   Your blood pressure today was BP: 150/73 mmHg.  Remember your goal blood pressure is about 140/80.  Please be sure to take your medication every day.  I want you to check your blood pressures several times a week and call the office for an appointment if it is running above 150/90.   I will have the nurses call you to schedule the HIDA Scan (to look at the gall bladder function).    Please make your next lab appointment for the morning and do not eat or drink before hand so we can get your blood work. I will send you a letter with your lab results, or call you if anything is abnormal.

## 2012-10-01 NOTE — Assessment & Plan Note (Signed)
Well controlled, no medication changes.

## 2012-10-01 NOTE — Telephone Encounter (Signed)
Noted. Erin Coffey, Erin Coffey

## 2012-10-01 NOTE — Telephone Encounter (Signed)
Erin Coffey called back to say that she will be out of town btwn 3/13 and 3/20.  Please wait until she return or schedule appt for other tests before she leaves.

## 2012-10-01 NOTE — Progress Notes (Signed)
  Subjective:    Patient ID: Erin Coffey, female    DOB: February 28, 1946, 67 y.o.   MRN: [id number]  HPI:  Erin Coffey comes in for follow up:   RUQ/Flank Pain: this is still bothering her.  She has known gall stones, but [name] did not show cholelithiasis, and she has had normal lab tests.  She also has degenerative disc disease in her back.  She describes the pain as staring in her back and wrapping around to the front.  She does not associate it with meals, but does think some dietary changes (avoiding fried foods) have helped. No fevers chills, nausea, vomiting.   DM: Taking metformin without difficultly.  No hypo or hyperglycemia.  No polyuria or polydypsia.   Palpitations: she has had these for a long time, has difficulty saying why she decided to bring this up right now.  She says she has occasional palpitations, it happens spontaneously, often when sitting still, but she does not associate it with emotional stress.  Does not associate with exertion or chest pain.  She takes a few deep breaths and it stops.    HLD: has not had lipids checked in >1 year, Takes fish oil but no other medications.  Not getting much exercise, trying to watch cholesterol in her diet.   Past Medical History  Diagnosis Date  . Allergy   . Arthritis   . Anemia, iron deficiency   . Diabetes mellitus   . GERD (gastroesophageal reflux disease)   . Hypertension   . Chronic kidney disease   . Thyroid disease   . Pulmonary embolism 04/28/11    History  Substance Use Topics  . Smoking status: Never Smoker   . Smokeless tobacco: Never Used  . Alcohol Use: No    Family History  Problem Relation Age of Onset  . Diabetes Mother   . Heart disease Mother   . Hypertension Mother   . Heart disease Father   . Hypertension Father   . Cancer Sister   . Diabetes Sister   . Hypertension Brother      ROS Pertinent items in HPI    Objective:  Physical Exam:  BP 150/73  Pulse 57  Temp(Src) 99.1 F (37.3 C)  (Oral)  Ht 5\' 9"  (1.753 m)  Wt 291 lb 4 oz (132.11 kg)  BMI 42.99 kg/m2 General appearance: alert, cooperative and no distress Head: Normocephalic, without obvious abnormality, atraumatic Lungs: clear to auscultation bilaterally Heart: regular rate and rhythm, S1, S2 normal, no murmur, click, rub or gallop ABD: +BS, obese, soft, mild RUQ tenderness to palpation, but negative Murphy's sign.  Pulses: 2+ and symmetric       Assessment & Plan:

## 2012-10-01 NOTE — Assessment & Plan Note (Signed)
Slightly elevated today, I have asked her to start monitoring it at home again to see if we need to make adjustments.

## 2012-10-01 NOTE — Assessment & Plan Note (Signed)
Normal heart sounds today with pulse of 57.  Discussed possibility of SVT vs. intermittent A-fib (already on coumadin).  Offered referral to Cards, which she declines.  I reviewed signs of MI to go to ER for, and will check TSH.

## 2012-10-01 NOTE — Assessment & Plan Note (Signed)
Unclear if this is biliary pain or not.  Will obtain HIDA scan.

## 2012-10-01 NOTE — Addendum Note (Signed)
Addended by: Cletus Gash on: 10/01/2012 05:36 PM   Modules accepted: Orders

## 2012-10-01 NOTE — Assessment & Plan Note (Signed)
Recheck lipids

## 2012-10-04 ENCOUNTER — Other Ambulatory Visit: Payer: Self-pay | Admitting: Family Medicine

## 2012-10-14 ENCOUNTER — Encounter (HOSPITAL_COMMUNITY): Payer: Medicare Other

## 2012-10-14 ENCOUNTER — Encounter (HOSPITAL_COMMUNITY)
Admission: RE | Admit: 2012-10-14 | Discharge: 2012-10-14 | Disposition: A | Discharge status: Home / Self Care | Payer: Medicare Other | Source: Ambulatory Visit | Attending: Family Medicine | Admitting: Family Medicine

## 2012-10-14 ENCOUNTER — Telehealth: Payer: Self-pay | Admitting: Family Medicine

## 2012-10-14 DIAGNOSIS — M549 Dorsalgia, unspecified: Secondary | ICD-10-CM

## 2012-10-14 DIAGNOSIS — R1011 Right upper quadrant pain: Secondary | ICD-10-CM | POA: Insufficient documentation

## 2012-10-14 DIAGNOSIS — K8020 Calculus of gallbladder without cholecystitis without obstruction: Secondary | ICD-10-CM | POA: Insufficient documentation

## 2012-10-14 MED ORDER — TECHNETIUM TC 99M MEBROFENIN IV KIT
5.0000 | PACK | Freq: Once | INTRAVENOUS | Status: AC | PRN
  Administered 2012-10-14: 5 via INTRAVENOUS

## 2012-10-14 NOTE — Telephone Encounter (Signed)
Called patient to discuss hepatobiliary imaging.  This imaging was relatively normal- making it less likely that her pain is from her gall bladder.  This makes me more suspicious of pain coming from her back and radiating around.  She wants to get to the bottom of the pain.  I have ordered x-rays of her thoracic spine, she can go to [facility] to have them done.    Shakari Qazi 10/14/2012 3:41 PM

## 2012-11-01 ENCOUNTER — Telehealth: Payer: Self-pay | Admitting: Family Medicine

## 2012-11-01 NOTE — Telephone Encounter (Signed)
Pt is asking about her referral to have an xray - (see orders but no referral) is that necessary to have referral in?

## 2012-11-01 NOTE — Telephone Encounter (Signed)
I think there was confusion when she called [facility].  I advised that the orders are in the computer, and to tell them that when she goes and they should be able to see them. Pt agreeable Jeff Mccallum, Moses Manners, Good question!!  Xrays, MRI, Ultrasounds, etc. are orders and not referrals. Lanis Storlie, Salome Spotted

## 2012-11-04 ENCOUNTER — Ambulatory Visit
Admission: RE | Admit: 2012-11-04 | Discharge: 2012-11-04 | Disposition: A | Discharge status: Home / Self Care | Payer: Medicare Other | Source: Ambulatory Visit | Attending: Family Medicine | Admitting: Family Medicine

## 2012-11-04 ENCOUNTER — Other Ambulatory Visit: Payer: Self-pay | Admitting: Family Medicine

## 2012-11-04 ENCOUNTER — Ambulatory Visit (INDEPENDENT_AMBULATORY_CARE_PROVIDER_SITE_OTHER): Payer: Medicare Other | Admitting: *Deleted

## 2012-11-04 DIAGNOSIS — Z7901 Long term (current) use of anticoagulants: Secondary | ICD-10-CM

## 2012-11-04 DIAGNOSIS — I10 Essential (primary) hypertension: Secondary | ICD-10-CM

## 2012-11-04 DIAGNOSIS — Z86711 Personal history of pulmonary embolism: Secondary | ICD-10-CM

## 2012-11-04 DIAGNOSIS — D509 Iron deficiency anemia, unspecified: Secondary | ICD-10-CM

## 2012-11-04 DIAGNOSIS — M549 Dorsalgia, unspecified: Secondary | ICD-10-CM

## 2012-11-04 DIAGNOSIS — R002 Palpitations: Secondary | ICD-10-CM

## 2012-11-04 LAB — CBC
HCT: 31.7 % — ABNORMAL LOW (ref 36.0–46.0)
Hemoglobin: 10.3 g/dL — ABNORMAL LOW (ref 12.0–15.0)
MCH: 26.6 pg (ref 26.0–34.0)
MCHC: 32.5 g/dL (ref 30.0–36.0)
MCV: 81.9 fL (ref 78.0–100.0)
Platelets: 206 10*3/uL (ref 150–400)
RBC: 3.87 MIL/uL (ref 3.87–5.11)
RDW: 16.9 % — ABNORMAL HIGH (ref 11.5–15.5)
WBC: 4.4 10*3/uL (ref 4.0–10.5)

## 2012-11-04 LAB — BASIC METABOLIC PANEL
BUN: 17 mg/dL (ref 6–23)
CO2: 26 mEq/L (ref 19–32)
Calcium: 9.2 mg/dL (ref 8.4–10.5)
Chloride: 107 mEq/L (ref 96–112)
Creat: 1.18 mg/dL — ABNORMAL HIGH (ref 0.50–1.10)
Glucose, Bld: 90 mg/dL (ref 70–99)
Potassium: 4.6 mEq/L (ref 3.5–5.3)
Sodium: 140 mEq/L (ref 135–145)

## 2012-11-04 LAB — POCT INR: INR: 2.8

## 2012-11-04 LAB — LDL CHOLESTEROL, DIRECT: Direct LDL: 93 mg/dL

## 2012-11-04 LAB — TSH: TSH: 1.67 u[IU]/mL (ref 0.350–4.500)

## 2012-11-05 ENCOUNTER — Telehealth: Payer: Self-pay | Admitting: Family Medicine

## 2012-11-05 ENCOUNTER — Encounter: Payer: Self-pay | Admitting: Family Medicine

## 2012-11-05 DIAGNOSIS — IMO0002 Reserved for concepts with insufficient information to code with codable children: Secondary | ICD-10-CM

## 2012-11-05 NOTE — Telephone Encounter (Signed)
Called patient to notify her X-ray of her back is read.  There were no acute changes, there is some spurring - meaning arthritis, and this could be causing her pain.    I told her that if the pain bothers her a lot, she could go see a doctor that does cortisone injections into the back, and I told her they would want to get an MRI of her back to make sure they think those kinds of shots would help her.   She says she does not want to do that right now, just glad to understand where her pain is coming from.

## 2012-11-15 NOTE — Telephone Encounter (Addendum)
Pt is asking to be referred to a phys therapist for her back - likes mornings and not Wednesday

## 2012-11-15 NOTE — Telephone Encounter (Signed)
Reviewed last encounter and x-ray for back pain. Referral sent for PT.  Please inform patient.

## 2012-11-15 NOTE — Addendum Note (Signed)
Addended by: Boykin Nearing on: 11/15/2012 05:43 PM   Modules accepted: Orders

## 2012-11-16 NOTE — Telephone Encounter (Signed)
LMOVM for pt to return call.  Please inform when she returns call that we have sent her referral to PT and they should be contacting her. Marino Rogerson, Salome Spotted

## 2012-11-30 ENCOUNTER — Ambulatory Visit: Payer: Medicare Other | Attending: Family Medicine | Admitting: Physical Therapy

## 2012-11-30 DIAGNOSIS — M545 Low back pain, unspecified: Secondary | ICD-10-CM | POA: Insufficient documentation

## 2012-11-30 DIAGNOSIS — IMO0001 Reserved for inherently not codable concepts without codable children: Secondary | ICD-10-CM | POA: Insufficient documentation

## 2012-11-30 DIAGNOSIS — M2560 Stiffness of unspecified joint, not elsewhere classified: Secondary | ICD-10-CM | POA: Insufficient documentation

## 2012-11-30 DIAGNOSIS — M546 Pain in thoracic spine: Secondary | ICD-10-CM | POA: Insufficient documentation

## 2012-12-09 ENCOUNTER — Ambulatory Visit: Payer: Medicare Other | Admitting: Physical Therapy

## 2012-12-09 ENCOUNTER — Ambulatory Visit (INDEPENDENT_AMBULATORY_CARE_PROVIDER_SITE_OTHER): Payer: Medicare Other | Admitting: *Deleted

## 2012-12-09 DIAGNOSIS — Z7901 Long term (current) use of anticoagulants: Secondary | ICD-10-CM

## 2012-12-09 DIAGNOSIS — Z86711 Personal history of pulmonary embolism: Secondary | ICD-10-CM

## 2012-12-09 LAB — POCT INR: INR: 2.8

## 2012-12-10 ENCOUNTER — Ambulatory Visit: Payer: Medicare Other | Admitting: Physical Therapy

## 2012-12-16 ENCOUNTER — Ambulatory Visit: Payer: Medicare Other | Admitting: Physical Therapy

## 2012-12-17 ENCOUNTER — Ambulatory Visit: Payer: Medicare Other | Admitting: Physical Therapy

## 2012-12-20 ENCOUNTER — Ambulatory Visit (INDEPENDENT_AMBULATORY_CARE_PROVIDER_SITE_OTHER): Payer: Medicare Other | Admitting: Family Medicine

## 2012-12-20 ENCOUNTER — Encounter: Payer: Self-pay | Admitting: Family Medicine

## 2012-12-20 VITALS — BP 170/89 | HR 61 | Temp 97.9°F | Ht 69.0 in | Wt 290.0 lb

## 2012-12-20 DIAGNOSIS — M546 Pain in thoracic spine: Secondary | ICD-10-CM

## 2012-12-20 HISTORY — DX: Pain in thoracic spine: M54.6

## 2012-12-20 MED ORDER — TRAMADOL HCL 50 MG PO TABS: 50.0000 mg | ORAL_TABLET | Freq: Three times a day (TID) | ORAL | Status: DC | PRN

## 2012-12-20 NOTE — Patient Instructions (Signed)
Please try taking the tramadol as needed for pain, especially at night time.  Also be sure to continue tot take your calcium and Vitamin D supplements.   I am referring you to see the Orthopedist for your back pain.

## 2012-12-20 NOTE — Assessment & Plan Note (Signed)
Patient with worsening pain, now radiating around her side.  Concern for disc pathology given symptoms.  She has tried physical therapy.  Will refer to [facility] to consider MRI and/or epidural steroid injections if they feel these would benefit her.  In the mean time, Rx for tramadol to help control pain, and advised to continue tylenol PRN.

## 2012-12-20 NOTE — Progress Notes (Signed)
  Subjective:    Patient ID: Erin Coffey, female    DOB: 1946-04-04, 67 y.o.   MRN: [id number]  HPI  Erin Coffey comes in with worsening back/side pain.  She has had issues for several months with this.  She initially described it as RUQ/Side pain and was worked up for gall bladder etiologies, but no cause of pain was found.  She had X-rays of her spine done that showed some degenerative changes.  She went to Physical therapy.  The pain has been getting worse.  It starts in her mid (thoracic) back, and radiates around her R side.  It hurts especially when she is lying down trying to go to sleep.  She denies weakness/numbness in hands or legs, incontinence.  She is taking tylenol for the pain, says it helps the arthritis in other parts of her body but not this.   Review of Systems See HPI    Objective:   Physical Exam BP 170/89  Pulse 61  Temp(Src) 97.9 F (36.6 C) (Oral)  Ht 5\' 9"  (1.753 m)  Wt 290 lb (131.543 kg)  BMI 42.81 kg/m2 General appearance: alert, cooperative, no distress and morbidly obese Back: no spinal tenderness to palpation in cervical or thoracic spine.  There is paraspinal tenderness in thoracic region. She has normal strength, sensation, and reflexes of upper extremities, and normal cervical range of motion.  She has pain with thoracic range of motion (twisting/leaning).      Assessment & Plan:

## 2013-01-06 ENCOUNTER — Ambulatory Visit (INDEPENDENT_AMBULATORY_CARE_PROVIDER_SITE_OTHER): Payer: Medicare Other | Admitting: *Deleted

## 2013-01-06 DIAGNOSIS — Z7901 Long term (current) use of anticoagulants: Secondary | ICD-10-CM

## 2013-01-06 DIAGNOSIS — Z86711 Personal history of pulmonary embolism: Secondary | ICD-10-CM

## 2013-01-06 LAB — POCT INR: INR: 2.6

## 2013-01-21 ENCOUNTER — Telehealth: Payer: Self-pay | Admitting: *Deleted

## 2013-01-21 NOTE — Telephone Encounter (Signed)
Immunization entered as historical and all immunizations entered [facility] -  ew

## 2013-01-26 ENCOUNTER — Other Ambulatory Visit: Payer: Self-pay | Admitting: Family Medicine

## 2013-02-10 ENCOUNTER — Telehealth: Payer: Self-pay | Admitting: Family Medicine

## 2013-02-10 ENCOUNTER — Ambulatory Visit (INDEPENDENT_AMBULATORY_CARE_PROVIDER_SITE_OTHER): Payer: Medicare Other | Admitting: *Deleted

## 2013-02-10 DIAGNOSIS — Z7901 Long term (current) use of anticoagulants: Secondary | ICD-10-CM

## 2013-02-10 DIAGNOSIS — Z86711 Personal history of pulmonary embolism: Secondary | ICD-10-CM

## 2013-02-10 LAB — POCT INR: INR: 3

## 2013-02-10 NOTE — Telephone Encounter (Signed)
Due to scheduling and the holiday weekend, this will be completed on Monday 02/14/13. I will mail it when completed.  Thank you, Basil Blakesley M. Leoma Folds, M.D.

## 2013-02-10 NOTE — Telephone Encounter (Signed)
Patient dropped off handicapped placard form to be filled out.  Please mail when completed.

## 2013-02-10 NOTE — Telephone Encounter (Signed)
Handicap Placard placed in Dr. Darnelle Going box for signature.  Lauralyn Primes

## 2013-02-14 NOTE — Telephone Encounter (Signed)
Handicap Placard mailed to [place], [place], [place] [address].  Lauralyn Primes

## 2013-02-17 ENCOUNTER — Other Ambulatory Visit: Payer: Self-pay

## 2013-03-10 ENCOUNTER — Ambulatory Visit (INDEPENDENT_AMBULATORY_CARE_PROVIDER_SITE_OTHER): Payer: Medicare Other | Admitting: Family Medicine

## 2013-03-10 ENCOUNTER — Ambulatory Visit (INDEPENDENT_AMBULATORY_CARE_PROVIDER_SITE_OTHER): Payer: Medicare Other | Admitting: *Deleted

## 2013-03-10 ENCOUNTER — Encounter: Payer: Self-pay | Admitting: Family Medicine

## 2013-03-10 VITALS — BP 173/85 | HR 67 | Temp 98.2°F | Wt 285.0 lb

## 2013-03-10 DIAGNOSIS — N19 Unspecified kidney failure: Secondary | ICD-10-CM

## 2013-03-10 DIAGNOSIS — M546 Pain in thoracic spine: Secondary | ICD-10-CM

## 2013-03-10 DIAGNOSIS — Z7901 Long term (current) use of anticoagulants: Secondary | ICD-10-CM

## 2013-03-10 DIAGNOSIS — E119 Type 2 diabetes mellitus without complications: Secondary | ICD-10-CM

## 2013-03-10 DIAGNOSIS — Z86711 Personal history of pulmonary embolism: Secondary | ICD-10-CM

## 2013-03-10 DIAGNOSIS — I10 Essential (primary) hypertension: Secondary | ICD-10-CM

## 2013-03-10 LAB — POCT INR: INR: 2.8

## 2013-03-10 LAB — POCT GLYCOSYLATED HEMOGLOBIN (HGB A1C): Hemoglobin A1C: 5.9

## 2013-03-10 MED ORDER — TRAMADOL HCL 50 MG PO TABS: 50.0000 mg | ORAL_TABLET | Freq: Four times a day (QID) | ORAL | Status: DC | PRN

## 2013-03-10 MED ORDER — METFORMIN HCL 500 MG PO TABS: ORAL_TABLET | ORAL | Status: DC

## 2013-03-10 MED ORDER — METOPROLOL TARTRATE 50 MG PO TABS: ORAL_TABLET | ORAL | Status: DC

## 2013-03-10 NOTE — Progress Notes (Signed)
Patient ID: Erin Coffey, female   DOB: 01/18/1946, 66 y.o.   MRN: [id number]  [name], MD Phone: [phone]   Subjective: HPI: Patient is a 67 y.o. female presenting to clinic today for follow up appointment. Concerns today include: None, just to meet new doctor  1. Back pain- Followed by Dr. Hennie Duos for back pain. Improving. Still on Ultram, and needs refill. No new concerns.  2. CKD - Followed by Dr. Lorrene Reid, last seen July 17. No changes, she states everything is ok. Dr. Lorrene Reid is however concerned about her iron being low.  3. DM-  High at home: 124 Low at home: 80's, no symptoms Taking medications: Metformi Side effects: None ROS: denies fever, chills, dizziness, LOC, polyuria, polydipsia, numbness or tingling in extremities or chest pain. Last eye exam: June 2014 - Dr. Sabra Heck (on [address]) Nephropathy screen indicated?: Followed by [facility] Last flu, zoster and/or pneumovax:  4. Hypertension Blood pressure at home: 140's/70's Blood pressure today:  173/85 Taking Meds: Valsartan, Metoprolol BID Side effects:  None ROS: Denies headache, visual changes, nausea, vomiting, chest pain, abdominal pain or shortness of breath.  5. Chronic anticoagulation Had 2 blood clots in the past. I am not familiar enough with her history at this point, but she has been on Coumadin for 2 years per her report. Due for INR today  History Reviewed: Non smoker. Health Maintenance: Needs pneumonia vaccine  ROS: Please see HPI above.  Objective: Office vital signs reviewed. BP 173/85  Pulse 67  Temp(Src) 98.2 F (36.8 C) (Oral)  Wt 285 lb (129.275 kg)  BMI 42.07 kg/m2  Physical Examination:  General: Awake, alert. NAD. Pleasant HEENT: Atraumatic, normocephalic. MMM Neck: No masses palpated. No LAD Pulm: CTAB, no wheezes Cardio: RRR, no murmurs appreciated Abdomen:+BS, soft, nontender, nondistended Extremities: No edema, good  distal pulses. Arthritis in hands Neuro: Grossly intact  Assessment: 67 y.o. female follow up.  Plan: See Problem List and After Visit Summary

## 2013-03-10 NOTE — Assessment & Plan Note (Signed)
Elevated in triage, but reports better blood pressures at home. SInce this is my first time meeting her, will continue current medications and recheck at next visit before changing meds.

## 2013-03-10 NOTE — Assessment & Plan Note (Signed)
Good reported blood sugar. Continue Metformin. A1C today pending.

## 2013-03-10 NOTE — Assessment & Plan Note (Signed)
INR today pending. Continue Coumadin for now until I can review her chart then we can discuss stopping the anticoagulation

## 2013-03-10 NOTE — Assessment & Plan Note (Signed)
Followed by Ortho with some improvement. Tramadol refilled.

## 2013-03-10 NOTE — Assessment & Plan Note (Signed)
Stable. Followed by [facility]

## 2013-03-10 NOTE — Patient Instructions (Addendum)
It was nice to meet you today!  I am glad everything is going well.  If you need addition refills, please let your pharmacy know.  I will see you back after Sept 26 for blood work and follow up appointment.  Estanislado Surgeon M. Setareh Rom, M.D.

## 2013-03-23 ENCOUNTER — Other Ambulatory Visit: Payer: Self-pay | Admitting: Physical Medicine and Rehabilitation

## 2013-03-23 DIAGNOSIS — M549 Dorsalgia, unspecified: Secondary | ICD-10-CM

## 2013-03-28 ENCOUNTER — Ambulatory Visit
Admission: RE | Admit: 2013-03-28 | Discharge: 2013-03-28 | Disposition: A | Discharge status: Home / Self Care | Payer: Medicare Other | Source: Ambulatory Visit | Attending: Physical Medicine and Rehabilitation | Admitting: Physical Medicine and Rehabilitation

## 2013-03-28 DIAGNOSIS — M549 Dorsalgia, unspecified: Secondary | ICD-10-CM

## 2013-03-31 ENCOUNTER — Telehealth: Payer: Self-pay | Admitting: Family Medicine

## 2013-03-31 NOTE — Telephone Encounter (Signed)
Please see note below. Tildon Husky, RN-BSN

## 2013-03-31 NOTE — Telephone Encounter (Signed)
Pt called and would like Dr. Sheral Apley to call and explain what her MRI results that she received in the mail. JW

## 2013-04-01 NOTE — Telephone Encounter (Signed)
This MRI was ordered by her orthopedist. Please have her call them to discuss the results and their plans.  Thank you Calum Cormier M. Shaneca Orne, M.D.

## 2013-04-01 NOTE — Telephone Encounter (Signed)
Pt states that she did not understand what their office said " they were using big words and I couldn't understand and I just wanted someone to explain it better" Pulled up pt record and explained MRI results - pt verbalized appreciation and states that the plan is to send her to neurosurgeon ( explained what that was ) - no further questions. Tildon Husky, RN-BSN

## 2013-04-01 NOTE — Telephone Encounter (Signed)
Patient is asking for Dr. Sheral Apley to explain them better to her.  She said that the orthopedist said that there was a lesion but she doesn't know what that means and they couldn't explain it to her so she was hoping that Dr. Sheral Apley could explain it better.

## 2013-04-07 ENCOUNTER — Ambulatory Visit (INDEPENDENT_AMBULATORY_CARE_PROVIDER_SITE_OTHER): Payer: Medicare Other | Admitting: *Deleted

## 2013-04-07 DIAGNOSIS — Z7901 Long term (current) use of anticoagulants: Secondary | ICD-10-CM

## 2013-04-07 DIAGNOSIS — Z86711 Personal history of pulmonary embolism: Secondary | ICD-10-CM

## 2013-04-07 LAB — POCT INR: INR: 2.9

## 2013-04-21 ENCOUNTER — Telehealth: Payer: Self-pay | Admitting: Family Medicine

## 2013-04-21 NOTE — Telephone Encounter (Signed)
Patient dropped off paper to filled out for handicapped plate.  Please call her when completed.

## 2013-04-22 ENCOUNTER — Other Ambulatory Visit: Payer: Self-pay | Admitting: Family Medicine

## 2013-04-25 NOTE — Telephone Encounter (Signed)
Forms that need completion and signature was placed in you box. Maven Rosander, Lewie Loron

## 2013-04-25 NOTE — Telephone Encounter (Signed)
Forms completed and returned.  Eliodoro Gullett M. Barbarita Hutmacher, M.D.

## 2013-05-05 ENCOUNTER — Ambulatory Visit (INDEPENDENT_AMBULATORY_CARE_PROVIDER_SITE_OTHER): Payer: Medicare Other | Admitting: Family Medicine

## 2013-05-05 ENCOUNTER — Ambulatory Visit (INDEPENDENT_AMBULATORY_CARE_PROVIDER_SITE_OTHER): Payer: Medicare Other | Admitting: *Deleted

## 2013-05-05 ENCOUNTER — Encounter: Payer: Self-pay | Admitting: Family Medicine

## 2013-05-05 VITALS — BP 160/80 | HR 62 | Temp 98.9°F | Wt 281.0 lb

## 2013-05-05 DIAGNOSIS — Z23 Encounter for immunization: Secondary | ICD-10-CM

## 2013-05-05 DIAGNOSIS — Z86718 Personal history of other venous thrombosis and embolism: Secondary | ICD-10-CM | POA: Insufficient documentation

## 2013-05-05 DIAGNOSIS — M25569 Pain in unspecified knee: Secondary | ICD-10-CM

## 2013-05-05 DIAGNOSIS — Z86711 Personal history of pulmonary embolism: Secondary | ICD-10-CM

## 2013-05-05 DIAGNOSIS — M546 Pain in thoracic spine: Secondary | ICD-10-CM

## 2013-05-05 DIAGNOSIS — M25561 Pain in right knee: Secondary | ICD-10-CM

## 2013-05-05 DIAGNOSIS — I10 Essential (primary) hypertension: Secondary | ICD-10-CM

## 2013-05-05 DIAGNOSIS — Z7901 Long term (current) use of anticoagulants: Secondary | ICD-10-CM

## 2013-05-05 HISTORY — DX: Personal history of other venous thrombosis and embolism: Z86.718

## 2013-05-05 LAB — COMPREHENSIVE METABOLIC PANEL
ALT: 13 U/L (ref 0–35)
AST: 20 U/L (ref 0–37)
Albumin: 3.8 g/dL (ref 3.5–5.2)
Alkaline Phosphatase: 59 U/L (ref 39–117)
BUN: 12 mg/dL (ref 6–23)
CO2: 24 mEq/L (ref 19–32)
Calcium: 9.3 mg/dL (ref 8.4–10.5)
Chloride: 107 mEq/L (ref 96–112)
Creat: 1.15 mg/dL — ABNORMAL HIGH (ref 0.50–1.10)
Glucose, Bld: 76 mg/dL (ref 70–99)
Potassium: 4.3 mEq/L (ref 3.5–5.3)
Sodium: 140 mEq/L (ref 135–145)
Total Bilirubin: 0.5 mg/dL (ref 0.3–1.2)
Total Protein: 8.3 g/dL (ref 6.0–8.3)

## 2013-05-05 LAB — CBC
HCT: 31.4 % — ABNORMAL LOW (ref 36.0–46.0)
Hemoglobin: 10.6 g/dL — ABNORMAL LOW (ref 12.0–15.0)
MCH: 27.5 pg (ref 26.0–34.0)
MCHC: 33.8 g/dL (ref 30.0–36.0)
MCV: 81.6 fL (ref 78.0–100.0)
Platelets: 208 10*3/uL (ref 150–400)
RBC: 3.85 MIL/uL — ABNORMAL LOW (ref 3.87–5.11)
RDW: 16.3 % — ABNORMAL HIGH (ref 11.5–15.5)
WBC: 5 10*3/uL (ref 4.0–10.5)

## 2013-05-05 LAB — POCT INR: INR: 3.8

## 2013-05-05 NOTE — Progress Notes (Signed)
Patient ID: Erin Coffey, female   DOB: 02-15-1946, 67 y.o.   MRN: [id number]  [facility] Erin Coffey M. Erin Scheiber, MD Phone: [phone]   Subjective: HPI: Patient is a 67 y.o. female presenting to clinic today for follow up appointment. Concerns today include leg pain  1. Leg pain - Feels sharp stabbing pain in posterior right leg for last several months. It does not last long. Comes and goes. Does not know of triggers. She notices it more when she is sitting in her recliner. Has not missed any coumadin doses. She has plans to fly to [place] in a few days, and again in November.   2. Back pain - Followed by neuro. Had MRI that showed a cyst, but does not seem to be correlated to her back pain. She states overall it is well controlled at this time.   3. Hypertension Blood pressure at home: Usually ok at home, states it is always higher at the doctor's office Blood pressure today: 160/80 Taking Meds: Yes, no missed doses Side effects: None ROS: Denies headache, visual changes, nausea, vomiting, chest pain, abdominal pain or shortness of breath.  History Reviewed: Never smoker. Health Maintenance: Needs Flu shot today and Prevnar   ROS: Please see HPI above.  Objective: Office vital signs reviewed. BP 160/80  Pulse 62  Temp(Src) 98.9 F (37.2 C) (Oral)  Wt 281 lb (127.461 kg)  BMI 41.48 kg/m2  Physical Examination:  General: Awake, alert. NAD HEENT: Atraumatic, normocephalic Neck: No masses palpated. No LAD Pulm: CTAB, no wheezes Cardio: RRR, no murmurs appreciated Abdomen:+BS, soft, nontender, nondistended Extremities: No edema. No posterior calf pain, no redness. Negative homans Neuro: Grossly intact  Assessment: 67 y.o. female follow up appointment  Plan: See Problem List and After Visit Summary

## 2013-05-05 NOTE — Assessment & Plan Note (Signed)
BP still elevated. She did not want recheck. Con't current regimen and follow up. CMEt and CBC today.

## 2013-05-05 NOTE — Assessment & Plan Note (Signed)
Unlikely her leg pain is related to DVT since she is on Coumadin and her history does not sound suspicious. Will check PT/INR today and send for duplex ultrasound to evaluate for bakers cyst. Given precautions for travel. F/u in 1 month.

## 2013-05-05 NOTE — Assessment & Plan Note (Signed)
Followed by neuro. No changes.

## 2013-05-05 NOTE — Patient Instructions (Signed)
We will schedule you for an ultrasound after your trip.  I will call you with your lab results if anything comes back abnormal.  If anything changes during your travel (leg swelling, increased pain, shortness of breath, coughing up blood) please seek medical care.  I will see you in 4 weeks for a check up, or sooner if you need anything.  Arjay Jaskiewicz M. Jefferie Holston, M.D.

## 2013-05-18 ENCOUNTER — Encounter (HOSPITAL_COMMUNITY): Payer: Medicare Other

## 2013-05-19 ENCOUNTER — Ambulatory Visit (INDEPENDENT_AMBULATORY_CARE_PROVIDER_SITE_OTHER): Payer: Medicare Other | Admitting: *Deleted

## 2013-05-19 ENCOUNTER — Ambulatory Visit (HOSPITAL_COMMUNITY)
Admission: RE | Admit: 2013-05-19 | Discharge: 2013-05-19 | Disposition: A | Discharge status: Home / Self Care | Payer: Medicare Other | Source: Ambulatory Visit | Attending: Family Medicine | Admitting: Family Medicine

## 2013-05-19 DIAGNOSIS — Z7901 Long term (current) use of anticoagulants: Secondary | ICD-10-CM

## 2013-05-19 DIAGNOSIS — M79609 Pain in unspecified limb: Secondary | ICD-10-CM

## 2013-05-19 DIAGNOSIS — Z86711 Personal history of pulmonary embolism: Secondary | ICD-10-CM

## 2013-05-19 DIAGNOSIS — Z86718 Personal history of other venous thrombosis and embolism: Secondary | ICD-10-CM

## 2013-05-19 DIAGNOSIS — M25561 Pain in right knee: Secondary | ICD-10-CM

## 2013-05-19 LAB — POCT INR: INR: 1.9

## 2013-05-19 NOTE — Progress Notes (Signed)
VASCULAR LAB PRELIMINARY  PRELIMINARY  PRELIMINARY  PRELIMINARY  Right lower extremity venous duplex completed.    Preliminary report:  Right:  No evidence of DVT, superficial thrombosis, or Baker's cyst.  Diane Mochizuki, RVS 05/19/2013, 12:16 PM

## 2013-06-02 ENCOUNTER — Ambulatory Visit (INDEPENDENT_AMBULATORY_CARE_PROVIDER_SITE_OTHER): Payer: Medicare Other | Admitting: *Deleted

## 2013-06-02 DIAGNOSIS — Z7901 Long term (current) use of anticoagulants: Secondary | ICD-10-CM

## 2013-06-02 DIAGNOSIS — Z86711 Personal history of pulmonary embolism: Secondary | ICD-10-CM

## 2013-06-02 LAB — POCT INR: INR: 2.5

## 2013-06-30 ENCOUNTER — Ambulatory Visit (INDEPENDENT_AMBULATORY_CARE_PROVIDER_SITE_OTHER): Payer: Medicare Other | Admitting: *Deleted

## 2013-06-30 DIAGNOSIS — Z86711 Personal history of pulmonary embolism: Secondary | ICD-10-CM

## 2013-06-30 DIAGNOSIS — Z7901 Long term (current) use of anticoagulants: Secondary | ICD-10-CM

## 2013-06-30 LAB — POCT INR: INR: 2.6

## 2013-07-28 ENCOUNTER — Ambulatory Visit (INDEPENDENT_AMBULATORY_CARE_PROVIDER_SITE_OTHER): Payer: Medicare Other | Admitting: *Deleted

## 2013-07-28 DIAGNOSIS — Z7901 Long term (current) use of anticoagulants: Secondary | ICD-10-CM

## 2013-07-28 DIAGNOSIS — Z86711 Personal history of pulmonary embolism: Secondary | ICD-10-CM

## 2013-07-28 LAB — POCT INR: INR: 3.7

## 2013-08-01 ENCOUNTER — Ambulatory Visit (INDEPENDENT_AMBULATORY_CARE_PROVIDER_SITE_OTHER): Payer: Medicare Other | Admitting: *Deleted

## 2013-08-01 DIAGNOSIS — Z7901 Long term (current) use of anticoagulants: Secondary | ICD-10-CM

## 2013-08-01 DIAGNOSIS — Z86711 Personal history of pulmonary embolism: Secondary | ICD-10-CM

## 2013-08-01 LAB — POCT INR: INR: 2.5

## 2013-08-07 ENCOUNTER — Other Ambulatory Visit: Payer: Self-pay | Admitting: Family Medicine

## 2013-09-01 ENCOUNTER — Ambulatory Visit (INDEPENDENT_AMBULATORY_CARE_PROVIDER_SITE_OTHER): Payer: Medicare Other | Admitting: *Deleted

## 2013-09-01 DIAGNOSIS — Z86711 Personal history of pulmonary embolism: Secondary | ICD-10-CM

## 2013-09-01 DIAGNOSIS — Z7901 Long term (current) use of anticoagulants: Secondary | ICD-10-CM

## 2013-09-01 LAB — POCT INR: INR: 2.8

## 2013-09-09 ENCOUNTER — Encounter: Payer: Self-pay | Admitting: Family Medicine

## 2013-09-09 ENCOUNTER — Ambulatory Visit (INDEPENDENT_AMBULATORY_CARE_PROVIDER_SITE_OTHER): Payer: Medicare Other | Admitting: Family Medicine

## 2013-09-09 VITALS — BP 130/88 | HR 72 | Temp 98.5°F | Ht 69.0 in | Wt 275.0 lb

## 2013-09-09 DIAGNOSIS — E119 Type 2 diabetes mellitus without complications: Secondary | ICD-10-CM

## 2013-09-09 DIAGNOSIS — M1990 Unspecified osteoarthritis, unspecified site: Secondary | ICD-10-CM

## 2013-09-09 DIAGNOSIS — D509 Iron deficiency anemia, unspecified: Secondary | ICD-10-CM

## 2013-09-09 DIAGNOSIS — I10 Essential (primary) hypertension: Secondary | ICD-10-CM

## 2013-09-09 LAB — CBC
HCT: 32.6 % — ABNORMAL LOW (ref 36.0–46.0)
Hemoglobin: 10.8 g/dL — ABNORMAL LOW (ref 12.0–15.0)
MCH: 27.6 pg (ref 26.0–34.0)
MCHC: 33.1 g/dL (ref 30.0–36.0)
MCV: 83.2 fL (ref 78.0–100.0)
Platelets: 216 10*3/uL (ref 150–400)
RBC: 3.92 MIL/uL (ref 3.87–5.11)
RDW: 16.7 % — ABNORMAL HIGH (ref 11.5–15.5)
WBC: 4.6 10*3/uL (ref 4.0–10.5)

## 2013-09-09 LAB — LIPID PANEL
Cholesterol: 189 mg/dL (ref 0–200)
HDL: 64 mg/dL (ref >39)
LDL Cholesterol: 115 mg/dL — ABNORMAL HIGH (ref 0–99)
Total CHOL/HDL Ratio: 3 Ratio
Triglycerides: 50 mg/dL (ref <150)
VLDL: 10 mg/dL (ref 0–40)

## 2013-09-09 LAB — POCT GLYCOSYLATED HEMOGLOBIN (HGB A1C): Hemoglobin A1C: 5.8

## 2013-09-09 NOTE — Progress Notes (Signed)
Patient ID: Erin Coffey, female   DOB: 1945/12/08, 68 y.o.   MRN: [id number]    Subjective: HPI: Patient is a 68 y.o. female presenting to clinic today for follow up. Concerns today include left leg pain  1. Hypertension Blood pressure at home: 130's/70's Blood pressure today: 150/70 at triage, recheck was 130/88 Taking Meds: Diovan, metoprolol Side effects: None, tolerating well. ROS: Denies headache, visual changes, nausea, vomiting, chest pain, abdominal pain or shortness of breath.  2. Diabetes:  Does not check at home Taking medications: Metformin twice daily Side effects: None ROS: denies fever, chills, dizziness, LOC, polyuria, polydipsia, numbness or tingling in extremities or chest pain. Last eye exam: May 2014, Dr. Sabra Heck Last foot exam: Needs today Nephropathy screen indicated?: Follow[facility] Lor[facility] at [facility] Last flu, zoster and/or pneumovax: Had flu shot this year, UTD on all others  3. Leg pain- 2 days ago. Used Biofreeze and propped leg up and pain went away. States felt heavy, but went away. Does not feel like previous clot. No missed coumadin doses, travel was many weeks ago. No injury. No problems walking.   History Reviewed: Non smoker. Health Maintenance: Colonoscopy 2011  ROS: Please see HPI above.  Objective: Office vital signs reviewed. BP 150/70  Pulse 72  Temp(Src) 98.5 F (36.9 C) (Oral)  Ht 5\' 9"  (1.753 m)  Wt 275 lb (124.739 kg)  BMI 40.59 kg/m2  Physical Examination:  General: Awake, alert. NAD HEENT: Atraumatic, normocephalic Neck: No masses palpated. No LAD Pulm: CTAB, no wheezes Cardio: RRR, no murmurs appreciated Abdomen: Obese, +BS, soft, nontender, nondistended Extremities: No edema. TTP behind left knee without mass or fluctuance  Neuro: Grossly intact  Assessment: 68 y.o. female follow up  Plan: See Problem List and After Visit Summary

## 2013-09-09 NOTE — Patient Instructions (Addendum)
Decrease Metformin to once daily.  I will see you back in May or June. Please let me know if you need anything before then. We will do a memory test at that time.  Erin Coffey, M.D.

## 2013-09-12 ENCOUNTER — Encounter: Payer: Self-pay | Admitting: Family Medicine

## 2013-09-12 NOTE — Assessment & Plan Note (Signed)
Pain in leg does not seem to be like DVT and her coumadin has been stable. Most likely from OA. Exam is normal. Con't Tylenol and Ultram prn. Return if pain worsens or changes.

## 2013-09-12 NOTE — Assessment & Plan Note (Signed)
Chronic due to kidneys. Will check CBC today. Con't iron supplementation and f/u with [facility] as previously scheduled.

## 2013-09-12 NOTE — Assessment & Plan Note (Signed)
Well controlled for a long period of time. WIll decrease metformin to once daily as patient is not ready to stop completely. Recheck A!C in 3-6 months.

## 2013-09-12 NOTE — Assessment & Plan Note (Signed)
BP improved with rest. Con't current regimen

## 2013-09-21 ENCOUNTER — Other Ambulatory Visit: Payer: Self-pay | Admitting: *Deleted

## 2013-09-21 MED ORDER — METFORMIN HCL 500 MG PO TABS: ORAL_TABLET | ORAL | Status: DC

## 2013-09-29 ENCOUNTER — Ambulatory Visit (INDEPENDENT_AMBULATORY_CARE_PROVIDER_SITE_OTHER): Payer: Medicare Other | Admitting: *Deleted

## 2013-09-29 DIAGNOSIS — Z7901 Long term (current) use of anticoagulants: Secondary | ICD-10-CM

## 2013-09-29 DIAGNOSIS — Z86711 Personal history of pulmonary embolism: Secondary | ICD-10-CM

## 2013-09-29 LAB — POCT INR: INR: 3.5

## 2013-10-06 ENCOUNTER — Ambulatory Visit: Payer: Medicare Other

## 2013-10-13 ENCOUNTER — Ambulatory Visit (INDEPENDENT_AMBULATORY_CARE_PROVIDER_SITE_OTHER): Payer: Medicare Other | Admitting: *Deleted

## 2013-10-13 DIAGNOSIS — Z7901 Long term (current) use of anticoagulants: Secondary | ICD-10-CM

## 2013-10-13 DIAGNOSIS — Z86711 Personal history of pulmonary embolism: Secondary | ICD-10-CM

## 2013-10-13 LAB — POCT INR: INR: 3.1

## 2013-10-18 ENCOUNTER — Other Ambulatory Visit: Payer: Self-pay | Admitting: *Deleted

## 2013-10-18 MED ORDER — METOPROLOL TARTRATE 50 MG PO TABS: ORAL_TABLET | ORAL | Status: DC

## 2013-10-20 ENCOUNTER — Other Ambulatory Visit: Payer: Self-pay | Admitting: Family Medicine

## 2013-10-24 ENCOUNTER — Other Ambulatory Visit: Payer: Self-pay | Admitting: Family Medicine

## 2013-10-24 MED ORDER — TRAMADOL HCL 50 MG PO TABS: 50.0000 mg | ORAL_TABLET | Freq: Four times a day (QID) | ORAL | Status: DC | PRN

## 2013-10-27 ENCOUNTER — Encounter: Payer: Self-pay | Admitting: Home Health Services

## 2013-10-27 ENCOUNTER — Ambulatory Visit (INDEPENDENT_AMBULATORY_CARE_PROVIDER_SITE_OTHER): Payer: Medicare Other | Admitting: Home Health Services

## 2013-10-27 ENCOUNTER — Ambulatory Visit (INDEPENDENT_AMBULATORY_CARE_PROVIDER_SITE_OTHER): Payer: Medicare Other | Admitting: *Deleted

## 2013-10-27 VITALS — BP 138/70 | HR 50 | Temp 97.9°F | Ht 69.0 in | Wt 277.0 lb

## 2013-10-27 DIAGNOSIS — Z86711 Personal history of pulmonary embolism: Secondary | ICD-10-CM

## 2013-10-27 DIAGNOSIS — Z0000 Encounter for general adult medical examination without abnormal findings: Secondary | ICD-10-CM

## 2013-10-27 DIAGNOSIS — Z7901 Long term (current) use of anticoagulants: Secondary | ICD-10-CM

## 2013-10-27 LAB — POCT INR: INR: 3.1

## 2013-10-27 NOTE — Progress Notes (Signed)
Patient here for annual wellness visit, patient reports: Risk Factors/Conditions needing evaluation or treatment: Pt does not have any new risk factors that need evaluations. Home Safety: Pt lives by self in 1 story home.  Pt reports having smoke detectors and adaptive equipment in bathroom.  Other Information: Corrective lens: Pt wears daily corrective lens.  Pt has regular eye exams. Dentures: Pt has full upper denture.   Memory: Pt denies any memory problems. Patient's Mini Mental Score (recorded in doc. flowsheet): 30 Balance/Gait: Pt reported 1 fall this past year and hurt her left shoulder.  We discussed strategies for home safety and fall prevention. Med Adherence: We discussed importance of taking prescribed medications daily for DM, HTN, and Cholesterol.  Pt reports 0 missed days in the past 2 weeks.  ADL/IADL:  Pt reports independence in all functions. BMI/Exercise:  We discussed BMI and to continue with her weight loss.  Pt reports no regular exercise routine but does have an active lifestyle. Bladder:  Pt reports no problems with bladder control.    Annual Wellness Visit Requirements Recorded Today In  Medical, family, social history Past Medical, Family, Social History Section  Current providers Care team  Current medications Medications  Wt, BP, Ht, BMI Vital signs  Hearing assessment (welcome visit) Hearing/vision  Tobacco, alcohol, illicit drug use History  ADL Nurse Assessment  Depression Screening Nurse Assessment  Cognitive impairment Nurse Assessment  Mini Mental Status Document Flowsheet  Fall Risk Fall/Depression  Home Safety Progress Note  End of Life Planning (welcome visit) Social Documentation  Medicare preventative services Progress Note  Risk factors/conditions needing evaluation/treatment Progress Note  Personalized health advice Patient Instructions, goals, letter  Diet & Exercise Social Documentation  Emergency Contact Social Documentation  Seat  Belts Social Documentation  Sun exposure/protection Social Documentation    Agree with above. Amber M. Hairford, M.D.

## 2013-11-01 ENCOUNTER — Other Ambulatory Visit: Payer: Self-pay | Admitting: Family Medicine

## 2013-11-03 ENCOUNTER — Ambulatory Visit (INDEPENDENT_AMBULATORY_CARE_PROVIDER_SITE_OTHER): Payer: Medicare Other | Admitting: Family Medicine

## 2013-11-03 ENCOUNTER — Encounter: Payer: Self-pay | Admitting: Family Medicine

## 2013-11-03 DIAGNOSIS — M545 Low back pain, unspecified: Secondary | ICD-10-CM

## 2013-11-03 MED ORDER — ACETAMINOPHEN-CODEINE 300-30 MG PO TABS: 1.0000 | ORAL_TABLET | ORAL | Status: DC | PRN

## 2013-11-03 MED ORDER — CYCLOBENZAPRINE HCL 5 MG PO TABS: ORAL_TABLET | ORAL | Status: DC

## 2013-11-03 NOTE — Patient Instructions (Signed)
Cervical Sprain A cervical sprain is an injury in the neck in which the strong, fibrous tissues (ligaments) that connect your neck bones stretch or tear. Cervical sprains can range from mild to severe. Severe cervical sprains can cause the neck vertebrae to be unstable. This can lead to damage of the spinal cord and can result in serious nervous system problems. The amount of time it takes for a cervical sprain to get better depends on the cause and extent of the injury. Most cervical sprains heal in 1 to 3 weeks. CAUSES  Severe cervical sprains may be caused by:   Contact sport injuries (such as from football, rugby, wrestling, hockey, auto racing, gymnastics, diving, martial arts, or boxing).   Motor vehicle collisions.   Whiplash injuries. This is an injury from a sudden forward-and backward whipping movement of the head and neck.  Falls.  Mild cervical sprains may be caused by:   Being in an awkward position, such as while cradling a telephone between your ear and shoulder.   Sitting in a chair that does not offer proper support.   Working at a poorly designed computer station.   Looking up or down for long periods of time.  SYMPTOMS   Pain, soreness, stiffness, or a burning sensation in the front, back, or sides of the neck. This discomfort may develop immediately after the injury or slowly, 24 hours or more after the injury.   Pain or tenderness directly in the middle of the back of the neck.   Shoulder or upper back pain.   Limited ability to move the neck.   Headache.   Dizziness.   Weakness, numbness, or tingling in the hands or arms.   Muscle spasms.   Difficulty swallowing or chewing.   Tenderness and swelling of the neck.  DIAGNOSIS  Most of the time your health care provider can diagnose a cervical sprain by taking your history and doing a physical exam. Your health care provider will ask about previous neck injuries and any known neck  problems, such as arthritis in the neck. X-rays may be taken to find out if there are any other problems, such as with the bones of the neck. Other tests, such as a CT scan or MRI, may also be needed.  TREATMENT  Treatment depends on the severity of the cervical sprain. Mild sprains can be treated with rest, keeping the neck in place (immobilization), and pain medicines. Severe cervical sprains are immediately immobilized. Further treatment is done to help with pain, muscle spasms, and other symptoms and may include:  Medicines, such as pain relievers, numbing medicines, or muscle relaxants.   Physical therapy. This may involve stretching exercises, strengthening exercises, and posture training. Exercises and improved posture can help stabilize the neck, strengthen muscles, and help stop symptoms from returning.  HOME CARE INSTRUCTIONS   Put ice on the injured area.   Put ice in a plastic bag.   Place a towel between your skin and the bag.   Leave the ice on for 15 20 minutes, 3 4 times a day.   If your injury was severe, you may have been given a cervical collar to wear. A cervical collar is a two-piece collar designed to keep your neck from moving while it heals.  Do not remove the collar unless instructed by your health care provider.  If you have long hair, keep it outside of the collar.  Ask your health care provider before making any adjustments to your collar.   Minor adjustments may be required over time to improve comfort and reduce pressure on your chin or on the back of your head.  Ifyou are allowed to remove the collar for cleaning or bathing, follow your health care provider's instructions on how to do so safely.  Keep your collar clean by wiping it with mild soap and water and drying it completely. If the collar you have been given includes removable pads, remove them every 1 2 days and hand wash them with soap and water. Allow them to air dry. They should be completely  dry before you wear them in the collar.  If you are allowed to remove the collar for cleaning and bathing, wash and dry the skin of your neck. Check your skin for irritation or sores. If you see any, tell your health care provider.  Do not drive while wearing the collar.   Only take over-the-counter or prescription medicines for pain, discomfort, or fever as directed by your health care provider.   Keep all follow-up appointments as directed by your health care provider.   Keep all physical therapy appointments as directed by your health care provider.   Make any needed adjustments to your workstation to promote good posture.   Avoid positions and activities that make your symptoms worse.   Warm up and stretch before being active to help prevent problems.  SEEK MEDICAL CARE IF:   Your pain is not controlled with medicine.   You are unable to decrease your pain medicine over time as planned.   Your activity level is not improving as expected.  SEEK IMMEDIATE MEDICAL CARE IF:   You develop any bleeding.  You develop stomach upset.  You have signs of an allergic reaction to your medicine.   Your symptoms get worse.   You develop new, unexplained symptoms.   You have numbness, tingling, weakness, or paralysis in any part of your body.  MAKE SURE YOU:   Understand these instructions.  Will watch your condition.  Will get help right away if you are not doing well or get worse. Document Released: 05/25/2007 Document Revised: 05/18/2013 Document Reviewed: 02/02/2013 [employer] Patient Information 2014 [employer].  
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
## 2013-11-10 ENCOUNTER — Ambulatory Visit (INDEPENDENT_AMBULATORY_CARE_PROVIDER_SITE_OTHER): Payer: Medicare Other | Admitting: *Deleted

## 2013-11-10 DIAGNOSIS — Z7901 Long term (current) use of anticoagulants: Secondary | ICD-10-CM

## 2013-11-10 DIAGNOSIS — Z86711 Personal history of pulmonary embolism: Secondary | ICD-10-CM

## 2013-11-10 LAB — POCT INR: INR: 3

## 2013-11-24 ENCOUNTER — Ambulatory Visit (INDEPENDENT_AMBULATORY_CARE_PROVIDER_SITE_OTHER): Payer: Medicare Other | Admitting: *Deleted

## 2013-11-24 DIAGNOSIS — Z7901 Long term (current) use of anticoagulants: Secondary | ICD-10-CM

## 2013-11-24 DIAGNOSIS — Z86711 Personal history of pulmonary embolism: Secondary | ICD-10-CM

## 2013-11-24 LAB — POCT INR: INR: 3

## 2013-12-23 ENCOUNTER — Ambulatory Visit (INDEPENDENT_AMBULATORY_CARE_PROVIDER_SITE_OTHER): Payer: Medicare Other | Admitting: Family Medicine

## 2013-12-23 ENCOUNTER — Ambulatory Visit (INDEPENDENT_AMBULATORY_CARE_PROVIDER_SITE_OTHER): Payer: Medicare Other | Admitting: *Deleted

## 2013-12-23 ENCOUNTER — Encounter: Payer: Self-pay | Admitting: Family Medicine

## 2013-12-23 VITALS — BP 178/90 | HR 58 | Temp 98.6°F | Wt 282.0 lb

## 2013-12-23 DIAGNOSIS — E119 Type 2 diabetes mellitus without complications: Secondary | ICD-10-CM

## 2013-12-23 DIAGNOSIS — I10 Essential (primary) hypertension: Secondary | ICD-10-CM

## 2013-12-23 DIAGNOSIS — M1990 Unspecified osteoarthritis, unspecified site: Secondary | ICD-10-CM

## 2013-12-23 DIAGNOSIS — Z7901 Long term (current) use of anticoagulants: Secondary | ICD-10-CM

## 2013-12-23 DIAGNOSIS — Z86711 Personal history of pulmonary embolism: Secondary | ICD-10-CM

## 2013-12-23 DIAGNOSIS — D509 Iron deficiency anemia, unspecified: Secondary | ICD-10-CM

## 2013-12-23 LAB — POCT INR: INR: 2.7

## 2013-12-23 LAB — POCT GLYCOSYLATED HEMOGLOBIN (HGB A1C): Hemoglobin A1C: 5.9

## 2013-12-23 MED ORDER — TRAMADOL HCL 50 MG PO TABS: 50.0000 mg | ORAL_TABLET | Freq: Four times a day (QID) | ORAL | Status: DC | PRN

## 2013-12-23 NOTE — Assessment & Plan Note (Signed)
Iron panel today

## 2013-12-23 NOTE — Assessment & Plan Note (Addendum)
A: A1C today 5.9. Doing well.  P: - Bmet  - Con't Metformin - F/u 3 months

## 2013-12-23 NOTE — Patient Instructions (Signed)
It was good to see you today.  I will send you a letter with your results.  You will need labs and a pneumonia shot in the fall.  You will need cholesterol check and your colonoscopy next spring.  Amber M. Hairford, M.D.

## 2013-12-23 NOTE — Assessment & Plan Note (Signed)
A: Slightly above goal, but previously doing well.   P: - No changes today - Recheck in 3 months and consider adjusting medications - F/u with [facility]

## 2013-12-23 NOTE — Assessment & Plan Note (Signed)
Refilled tramadol today #60/2R

## 2013-12-23 NOTE — Progress Notes (Signed)
Patient ID: Erin Coffey, female   DOB: 1946-06-02, 68 y.o.   MRN: [id number]    Subjective: HPI: Patient is a 68 y.o. female presenting to clinic today for follow up appointment. Concerns today include pain in right side  1. Diabetes: Does not check Taking medications: Metformin 500mg  in the mornings Side effects: None ROS: denies fever, chills, dizziness, LOC, polyuria, polydipsia, numbness or tingling in extremities or chest pain. Last eye exam: Due June 2 Last foot exam: Today Nephropathy screen indicated?: Due today Last flu, zoster and/or pneumovax: UTD  2. Hypertension Blood pressure today: 160/82 on recheck Taking Meds: Yes Side effects: None ROS: Denies headache, visual changes, nausea, vomiting, chest pain, abdominal pain or shortness of breath.  3. Pain Pain in right side of back, chronic. No new trauma. On Tramadol, needs refill today. H/o FM.   History Reviewed: Non smoker. Health Maintenance: Mammogram March 2015- Recommend one year. Colonoscopy 2011 recommend 5 year  ROS: Please see HPI above.  Objective: Office vital signs reviewed. BP 178/90  Pulse 58  Temp(Src) 98.6 F (37 C) (Oral)  Wt 282 lb (127.914 kg)  Physical Examination:  General: Awake, alert. NAD HEENT: Atraumatic, normocephalic. MMM Neck: No masses palpated. No LAD Back: NO TTP, good ROM Pulm: CTAB, no wheezes Cardio: RRR, no murmurs appreciated Abdomen:+BS, soft, nontender, nondistended Extremities: No edema Foot exam: Wnl, no open sores or skin breakdown. Sensation intact. Pulses normal. Neuro: Grossly intact  Assessment: 68 y.o. female follow up  Plan: See Problem List and After Visit Summary

## 2013-12-24 LAB — BASIC METABOLIC PANEL
BUN: 16 mg/dL (ref 6–23)
CO2: 26 mEq/L (ref 19–32)
Calcium: 9.1 mg/dL (ref 8.4–10.5)
Chloride: 106 mEq/L (ref 96–112)
Creat: 1.17 mg/dL — ABNORMAL HIGH (ref 0.50–1.10)
Glucose, Bld: 87 mg/dL (ref 70–99)
Potassium: 4.7 mEq/L (ref 3.5–5.3)
Sodium: 140 mEq/L (ref 135–145)

## 2013-12-24 LAB — IBC PANEL
%SAT: 16 % — ABNORMAL LOW (ref 20–55)
TIBC: 258 ug/dL (ref 250–470)
UIBC: 217 ug/dL (ref 125–400)

## 2013-12-24 LAB — IRON: Iron: 41 ug/dL — ABNORMAL LOW (ref 42–145)

## 2013-12-26 ENCOUNTER — Encounter: Payer: Self-pay | Admitting: Family Medicine

## 2014-01-19 ENCOUNTER — Ambulatory Visit (INDEPENDENT_AMBULATORY_CARE_PROVIDER_SITE_OTHER): Payer: Medicare Other | Admitting: *Deleted

## 2014-01-19 DIAGNOSIS — Z86711 Personal history of pulmonary embolism: Secondary | ICD-10-CM

## 2014-01-19 DIAGNOSIS — Z7901 Long term (current) use of anticoagulants: Secondary | ICD-10-CM

## 2014-01-19 LAB — POCT INR: INR: 3.6

## 2014-01-23 ENCOUNTER — Other Ambulatory Visit: Payer: Self-pay | Admitting: Family Medicine

## 2014-02-02 ENCOUNTER — Ambulatory Visit (INDEPENDENT_AMBULATORY_CARE_PROVIDER_SITE_OTHER): Payer: Medicare Other | Admitting: *Deleted

## 2014-02-02 DIAGNOSIS — E785 Hyperlipidemia, unspecified: Secondary | ICD-10-CM

## 2014-02-02 DIAGNOSIS — Z86711 Personal history of pulmonary embolism: Secondary | ICD-10-CM

## 2014-02-02 DIAGNOSIS — Z7901 Long term (current) use of anticoagulants: Secondary | ICD-10-CM

## 2014-02-02 DIAGNOSIS — E119 Type 2 diabetes mellitus without complications: Secondary | ICD-10-CM

## 2014-02-02 LAB — LDL CHOLESTEROL, DIRECT: Direct LDL: 98 mg/dL

## 2014-02-02 LAB — POCT INR: INR: 3.5

## 2014-02-02 NOTE — Addendum Note (Signed)
Addended by: [name], Roth Ress on: 02/02/2014 02:54 PM   Modules accepted: Orders

## 2014-02-03 ENCOUNTER — Encounter: Payer: Self-pay | Admitting: Family Medicine

## 2014-02-16 ENCOUNTER — Ambulatory Visit (INDEPENDENT_AMBULATORY_CARE_PROVIDER_SITE_OTHER): Payer: Medicare Other | Admitting: *Deleted

## 2014-02-16 DIAGNOSIS — Z86711 Personal history of pulmonary embolism: Secondary | ICD-10-CM

## 2014-02-16 DIAGNOSIS — Z7901 Long term (current) use of anticoagulants: Secondary | ICD-10-CM

## 2014-02-16 LAB — POCT INR: INR: 2.9

## 2014-03-16 ENCOUNTER — Ambulatory Visit (INDEPENDENT_AMBULATORY_CARE_PROVIDER_SITE_OTHER): Payer: Medicare Other | Admitting: Family Medicine

## 2014-03-16 ENCOUNTER — Encounter: Payer: Self-pay | Admitting: Family Medicine

## 2014-03-16 ENCOUNTER — Ambulatory Visit (INDEPENDENT_AMBULATORY_CARE_PROVIDER_SITE_OTHER): Payer: Medicare Other | Admitting: *Deleted

## 2014-03-16 VITALS — BP 126/70 | HR 60 | Ht 69.0 in | Wt 281.0 lb

## 2014-03-16 DIAGNOSIS — I10 Essential (primary) hypertension: Secondary | ICD-10-CM

## 2014-03-16 DIAGNOSIS — E119 Type 2 diabetes mellitus without complications: Secondary | ICD-10-CM

## 2014-03-16 DIAGNOSIS — Z7901 Long term (current) use of anticoagulants: Secondary | ICD-10-CM

## 2014-03-16 DIAGNOSIS — Z86711 Personal history of pulmonary embolism: Secondary | ICD-10-CM

## 2014-03-16 DIAGNOSIS — M1990 Unspecified osteoarthritis, unspecified site: Secondary | ICD-10-CM

## 2014-03-16 LAB — POCT INR: INR: 2.9

## 2014-03-16 LAB — POCT GLYCOSYLATED HEMOGLOBIN (HGB A1C): Hemoglobin A1C: 5.9

## 2014-03-16 NOTE — Patient Instructions (Signed)
Nice to meet you. Your diabetes is doing well. You do not need the second pneumonia shot until your next visit. We will plan to have you back in 3 months.

## 2014-03-16 NOTE — Assessment & Plan Note (Signed)
Patient with osteoarthritis of the back followed by Percell Miller and Noemi Chapel. Has been well controlled on tramadol and tylenol. No neurological deficits. She is to continue current treatment and f/u if worsens.

## 2014-03-16 NOTE — Assessment & Plan Note (Signed)
Is at goal at this time. Will continue current medications. F/u in 3 months with [name] and f/u with [facility] as scheduled later this month.

## 2014-03-16 NOTE — Assessment & Plan Note (Signed)
Had discussion with patient regarding risks and benefits of continued coumadin therapy. Patient had what appears to be an unprovoked PE in 2012. Given that there was no precipitating factor she would likely benefit from continued coumadin therapy. She agrees with this and notes she would be more comfortable continuing this as long as it is not causing harm. She is to continue INR checks.

## 2014-03-16 NOTE — Progress Notes (Signed)
   Patient ID: Erin Coffey, female   DOB: 1946/07/28, 68 y.o.   MRN: [id number]  HPI Review of Systems  Physical Exam  [name], MD Phone: [phone]  Erin Coffey is a 68 y.o. female who presents today for f/u.  Back pain: patient reports no current back pain. She notes she was told her back pain was caused by arthritis. She notes she had an MRI of her back last year that revealed a thoracic cyst. She was followed by [facility] for this back pain and sent to neurology when they got this MRI. She states the neurologist told her it was not changing compared to prior scans and should not cause an issue and she would need to have repeat scans every 1-2 years to follow this. She denies any leg weakness or numbness, saddle paraesthesias, urinary incontinence, or bowel incontinence. She denies fevers, chills, night sweats, or unintentional weight loss. She denies a personal history of cancer. She currently takes Tramadol + 2 tylenols every 3-4 days for her back pain due to her osteoarthritis and reports her pain is currently well-controlled.   DIABETES Disease Monitoring: Blood Sugar ranges-does not check often, last check was 118 Polyuria/phagia/dipsia- no      Visual problems- no - last eye exam June 1015 Medications: Compliance- taking metformin Hypoglycemic symptoms- once a week if she does not eat  HYPERTENSION Disease Monitoring Home BP Monitoring not checking Chest pain- no    Dyspnea- no Medications Compliance-  Taking valsartan and metoprolol. Lightheadedness-  no  Edema- no  History of PE: Patient diagnosed with PE in 04/28/11. She has been on warfarin since that time. She had no precipitating cause. No prior travel, surgery, or malignancy. No DVT prior. She has been on coumadin since that time. She currently denies any abnormal bleeding or bruising. She also denies any falls within the past 12 months. Patient notes she would like to continue Warfarin at this time  as she has not had any issues with it and she would feel more comfortable taking this given her clot history and future airline travel plans.  Patient is a nonsmoker    ROS: Per HPI   Physical Exam Filed Vitals:   03/16/14 1342  BP: 126/70  Pulse: 60    Gen: Well NAD HEENT: PERRL,  MMM Lungs: CTABL Nl WOB Heart: RRR no MRG Neuro: 5/5 strength in bilateral quads, hamstrings, plantar and dorsiflexion, sensation to light touch intact in bilateral LE, normal gait, 2+ patellar reflexes Back: no tenderness to palpation of any portion of her back Exts: Non edematous BL  LE, warm and well perfused.    Assessment/Plan: Please see individual problem list.  # Healthcare maintenance: needs flu shot when available.

## 2014-03-16 NOTE — Assessment & Plan Note (Signed)
A1c remains at goal. Will continue metformin. F/u in 3 months.

## 2014-04-13 ENCOUNTER — Ambulatory Visit (INDEPENDENT_AMBULATORY_CARE_PROVIDER_SITE_OTHER): Payer: Medicare Other | Admitting: *Deleted

## 2014-04-13 DIAGNOSIS — Z86711 Personal history of pulmonary embolism: Secondary | ICD-10-CM

## 2014-04-13 DIAGNOSIS — Z7901 Long term (current) use of anticoagulants: Secondary | ICD-10-CM

## 2014-04-13 LAB — POCT INR: INR: 3.7

## 2014-04-21 ENCOUNTER — Other Ambulatory Visit: Payer: Self-pay | Admitting: *Deleted

## 2014-04-23 MED ORDER — WARFARIN SODIUM 5 MG PO TABS: ORAL_TABLET | ORAL | Status: DC

## 2014-04-23 MED ORDER — METOPROLOL TARTRATE 50 MG PO TABS: ORAL_TABLET | ORAL | Status: DC

## 2014-04-27 ENCOUNTER — Ambulatory Visit (INDEPENDENT_AMBULATORY_CARE_PROVIDER_SITE_OTHER): Payer: Medicare Other | Admitting: *Deleted

## 2014-04-27 DIAGNOSIS — Z7901 Long term (current) use of anticoagulants: Secondary | ICD-10-CM

## 2014-04-27 DIAGNOSIS — Z86711 Personal history of pulmonary embolism: Secondary | ICD-10-CM

## 2014-04-27 LAB — POCT INR: INR: 2.6

## 2014-05-18 ENCOUNTER — Ambulatory Visit (INDEPENDENT_AMBULATORY_CARE_PROVIDER_SITE_OTHER): Payer: Medicare Other | Admitting: *Deleted

## 2014-05-18 DIAGNOSIS — Z86711 Personal history of pulmonary embolism: Secondary | ICD-10-CM

## 2014-05-18 DIAGNOSIS — Z7901 Long term (current) use of anticoagulants: Secondary | ICD-10-CM

## 2014-05-18 LAB — POCT INR: INR: 3.1

## 2014-05-24 ENCOUNTER — Other Ambulatory Visit: Payer: Self-pay | Admitting: *Deleted

## 2014-05-24 MED ORDER — METFORMIN HCL 500 MG PO TABS: ORAL_TABLET | ORAL | Status: DC

## 2014-05-24 NOTE — Telephone Encounter (Signed)
Shane,pharmacist request a 90 day supply.  Pt is has not taken medication in a while.  Please send in [place]. Derl Barrow, RN

## 2014-05-24 NOTE — Telephone Encounter (Signed)
Refill sent to pharmacy.   

## 2014-06-08 ENCOUNTER — Ambulatory Visit (INDEPENDENT_AMBULATORY_CARE_PROVIDER_SITE_OTHER): Payer: Medicare Other | Admitting: *Deleted

## 2014-06-08 DIAGNOSIS — Z86711 Personal history of pulmonary embolism: Secondary | ICD-10-CM

## 2014-06-08 DIAGNOSIS — Z23 Encounter for immunization: Secondary | ICD-10-CM

## 2014-06-08 DIAGNOSIS — Z7901 Long term (current) use of anticoagulants: Secondary | ICD-10-CM

## 2014-06-08 LAB — POCT INR: INR: 2.6

## 2014-06-29 ENCOUNTER — Ambulatory Visit (INDEPENDENT_AMBULATORY_CARE_PROVIDER_SITE_OTHER): Payer: Medicare Other | Admitting: *Deleted

## 2014-06-29 DIAGNOSIS — Z7901 Long term (current) use of anticoagulants: Secondary | ICD-10-CM

## 2014-06-29 DIAGNOSIS — Z86711 Personal history of pulmonary embolism: Secondary | ICD-10-CM

## 2014-06-29 LAB — POCT INR: INR: 3.2

## 2014-07-03 ENCOUNTER — Encounter: Payer: Self-pay | Admitting: Family Medicine

## 2014-07-03 ENCOUNTER — Ambulatory Visit (INDEPENDENT_AMBULATORY_CARE_PROVIDER_SITE_OTHER): Payer: Medicare Other | Admitting: Family Medicine

## 2014-07-03 VITALS — BP 140/80 | HR 68 | Temp 98.1°F | Ht 69.0 in | Wt 280.0 lb

## 2014-07-03 DIAGNOSIS — S46819A Strain of other muscles, fascia and tendons at shoulder and upper arm level, unspecified arm, initial encounter: Secondary | ICD-10-CM | POA: Insufficient documentation

## 2014-07-03 DIAGNOSIS — I10 Essential (primary) hypertension: Secondary | ICD-10-CM

## 2014-07-03 DIAGNOSIS — E119 Type 2 diabetes mellitus without complications: Secondary | ICD-10-CM

## 2014-07-03 DIAGNOSIS — S46812A Strain of other muscles, fascia and tendons at shoulder and upper arm level, left arm, initial encounter: Secondary | ICD-10-CM

## 2014-07-03 LAB — POCT GLYCOSYLATED HEMOGLOBIN (HGB A1C): Hemoglobin A1C: 5.7

## 2014-07-03 MED ORDER — BACLOFEN 10 MG PO TABS: 10.0000 mg | ORAL_TABLET | Freq: Three times a day (TID) | ORAL | Status: DC

## 2014-07-03 MED ORDER — METFORMIN HCL 500 MG PO TABS: ORAL_TABLET | ORAL | Status: DC

## 2014-07-03 MED ORDER — TRAMADOL HCL 50 MG PO TABS: 50.0000 mg | ORAL_TABLET | Freq: Four times a day (QID) | ORAL | Status: DC | PRN

## 2014-07-03 NOTE — Patient Instructions (Signed)
Nice to see you. You likely have a pulled muscle.  We will treat you with a muscle relaxer and tramadol. You can apply heat to the area. You may also try capsacin to apply to the area of your breast.  If you develop chest pain, shortness of breath, or sweating with this or it comes on and does not go away please seek medical attention.

## 2014-07-03 NOTE — Assessment & Plan Note (Signed)
At goal on recheck. Will continue current regimen.

## 2014-07-03 NOTE — Progress Notes (Signed)
Patient ID: Yensi Daughety, female   DOB: Oct 07, 1945, 68 y.o.   MRN: [id number]  [name], MD Phone: [phone]  Erin Coffey is a 68 y.o. female who presents today for f/u.  Left shoulder blade pain: in infraspinatous region there is pain if she presses on the area. She notes this radiates down the back of her left arm. It is a pulling/jabbing pain. Does not hurt with movement or exertion. Has been present for 2 weeks. More recently has had tingling in the lateral aspect of her left breast. No CP, SOB, or diaphoresis with this. This is not exertional. Has a long history of left shoulder pain. No weakness of UE with this.   HYPERTENSION Disease Monitoring Home BP Monitoring 138/75 last night Chest pain- no    Dyspnea- no Medications Compliance-  taking.  Edema- no  DIABETES Disease Monitoring: Blood Sugar ranges-124 the other evening Polyuria/phagia/dipsia- no      Medications: Compliance- taking metformin once daily Hypoglycemic symptoms- no  Patient is a nonsmoker.   ROS: Per HPI   Physical Exam Filed Vitals:   07/03/14 0854  BP: 140/80  Pulse:   Temp:     Gen: Well NAD HEENT: PERRL,  MMM Lungs: CTABL Nl WOB Heart: RRR no MRG MSK: left infraspinatus with tenderness and mild spasm on palpation, no pain with internal or external ROM, there is pain in bilateral shoulders with empty can testing, neg speeds test Neuro: 5/5 strength in bilateral biceps, triceps, grip, sensation to light touch intact in bilateral UE Breast: bilateral breast exam with no masses , rash, or decreased sensation Exts: Non edematous BL  LE, warm and well perfused.    Assessment/Plan: Please see individual problem list.  [name], MD [place] PGY-3

## 2014-07-03 NOTE — Assessment & Plan Note (Addendum)
Patient with likely strain of infraspinatus muscle on left given pain and tenderness and spasm of the area. There is likely inflammation in the area that has irritated the long thoracic nerve leading to intermittent tingling. No chest pain, dyspnea, or diaphoresis to indicate cardiac cause. Normal breast exam. Will treat with baclofen and refill tramadol. Advised to use capsacin cream on the area and heating pads. Given return precautions. F/u in one month.   Precepted with Dr Mingo Amber

## 2014-07-03 NOTE — Assessment & Plan Note (Signed)
At goal. Could consider coming off metformin in the future. Continue current regimen.

## 2014-07-04 ENCOUNTER — Telehealth: Payer: Self-pay | Admitting: *Deleted

## 2014-07-04 NOTE — Telephone Encounter (Signed)
Pt informed. Fleeger, Jessica Dawn  

## 2014-07-04 NOTE — Telephone Encounter (Signed)
-----   Message from [name], MD sent at 07/03/2014  5:42 PM EST ----- Please call the patient and let her know her A1c was 5.7. This is well controlled. Thanks.

## 2014-07-27 ENCOUNTER — Ambulatory Visit (INDEPENDENT_AMBULATORY_CARE_PROVIDER_SITE_OTHER): Payer: Medicare Other | Admitting: *Deleted

## 2014-07-27 ENCOUNTER — Ambulatory Visit: Payer: Medicare Other

## 2014-07-27 DIAGNOSIS — Z7901 Long term (current) use of anticoagulants: Secondary | ICD-10-CM

## 2014-07-27 DIAGNOSIS — Z86711 Personal history of pulmonary embolism: Secondary | ICD-10-CM

## 2014-07-27 LAB — POCT INR: INR: 2.4

## 2014-08-24 ENCOUNTER — Ambulatory Visit (INDEPENDENT_AMBULATORY_CARE_PROVIDER_SITE_OTHER): Payer: 59 | Admitting: *Deleted

## 2014-08-24 ENCOUNTER — Ambulatory Visit (INDEPENDENT_AMBULATORY_CARE_PROVIDER_SITE_OTHER): Payer: Medicare Other | Admitting: *Deleted

## 2014-08-24 DIAGNOSIS — Z7901 Long term (current) use of anticoagulants: Secondary | ICD-10-CM

## 2014-08-24 DIAGNOSIS — Z86711 Personal history of pulmonary embolism: Secondary | ICD-10-CM | POA: Diagnosis not present

## 2014-08-24 DIAGNOSIS — Z23 Encounter for immunization: Secondary | ICD-10-CM | POA: Diagnosis not present

## 2014-08-24 LAB — POCT INR: INR: 2.3

## 2014-09-21 ENCOUNTER — Ambulatory Visit (INDEPENDENT_AMBULATORY_CARE_PROVIDER_SITE_OTHER): Payer: 59 | Admitting: *Deleted

## 2014-09-21 DIAGNOSIS — Z7901 Long term (current) use of anticoagulants: Secondary | ICD-10-CM

## 2014-09-21 DIAGNOSIS — E785 Hyperlipidemia, unspecified: Secondary | ICD-10-CM | POA: Diagnosis not present

## 2014-09-21 DIAGNOSIS — Z86711 Personal history of pulmonary embolism: Secondary | ICD-10-CM

## 2014-09-21 DIAGNOSIS — I129 Hypertensive chronic kidney disease with stage 1 through stage 4 chronic kidney disease, or unspecified chronic kidney disease: Secondary | ICD-10-CM | POA: Diagnosis not present

## 2014-09-21 DIAGNOSIS — N183 Chronic kidney disease, stage 3 (moderate): Secondary | ICD-10-CM | POA: Diagnosis not present

## 2014-09-21 DIAGNOSIS — D649 Anemia, unspecified: Secondary | ICD-10-CM | POA: Diagnosis not present

## 2014-09-21 LAB — POCT INR: INR: 2.7

## 2014-10-16 ENCOUNTER — Other Ambulatory Visit: Payer: Self-pay | Admitting: Family Medicine

## 2014-10-16 NOTE — Telephone Encounter (Signed)
Refill given. Patient needs to follow-up for HTN in the next 1-2 months. Thanks.

## 2014-10-17 NOTE — Telephone Encounter (Signed)
Letter mailed. Mairen Wallenstein,CMA  

## 2014-10-19 ENCOUNTER — Ambulatory Visit (INDEPENDENT_AMBULATORY_CARE_PROVIDER_SITE_OTHER): Payer: Medicare Other | Admitting: *Deleted

## 2014-10-19 DIAGNOSIS — Z86711 Personal history of pulmonary embolism: Secondary | ICD-10-CM

## 2014-10-19 LAB — POCT INR: INR: 3.6

## 2014-10-19 NOTE — Progress Notes (Signed)
Patient reports eating less greens than normal. She is going to go back to her normal diet so we will not make any changes to her coumadin today. Patient will follow up in two weeks for a recheck. Busick, Kevin Fenton

## 2014-10-26 DIAGNOSIS — Z1231 Encounter for screening mammogram for malignant neoplasm of breast: Secondary | ICD-10-CM | POA: Diagnosis not present

## 2014-10-26 DIAGNOSIS — Z803 Family history of malignant neoplasm of breast: Secondary | ICD-10-CM | POA: Diagnosis not present

## 2014-11-02 ENCOUNTER — Ambulatory Visit (INDEPENDENT_AMBULATORY_CARE_PROVIDER_SITE_OTHER): Payer: Medicare Other | Admitting: Family Medicine

## 2014-11-02 ENCOUNTER — Ambulatory Visit (INDEPENDENT_AMBULATORY_CARE_PROVIDER_SITE_OTHER): Payer: Medicare Other | Admitting: *Deleted

## 2014-11-02 ENCOUNTER — Encounter: Payer: Self-pay | Admitting: Family Medicine

## 2014-11-02 VITALS — BP 138/78 | HR 59 | Temp 98.5°F | Ht 69.0 in | Wt 279.0 lb

## 2014-11-02 DIAGNOSIS — Z7901 Long term (current) use of anticoagulants: Secondary | ICD-10-CM | POA: Diagnosis not present

## 2014-11-02 DIAGNOSIS — Z86711 Personal history of pulmonary embolism: Secondary | ICD-10-CM

## 2014-11-02 DIAGNOSIS — E119 Type 2 diabetes mellitus without complications: Secondary | ICD-10-CM

## 2014-11-02 DIAGNOSIS — I10 Essential (primary) hypertension: Secondary | ICD-10-CM | POA: Diagnosis not present

## 2014-11-02 DIAGNOSIS — M65342 Trigger finger, left ring finger: Secondary | ICD-10-CM | POA: Diagnosis not present

## 2014-11-02 DIAGNOSIS — M7712 Lateral epicondylitis, left elbow: Secondary | ICD-10-CM

## 2014-11-02 HISTORY — DX: Trigger finger, left ring finger: M65.342

## 2014-11-02 LAB — POCT INR: INR: 2.4

## 2014-11-02 MED ORDER — VALSARTAN 320 MG PO TABS: 320.0000 mg | ORAL_TABLET | Freq: Every day | ORAL | Status: DC

## 2014-11-02 MED ORDER — TENNIS ELBOW STRAP MISC
1.0000 | Freq: Every day | Status: DC
Start: 2014-11-02 — End: 2015-06-25

## 2014-11-02 NOTE — Patient Instructions (Signed)
Nice to see you. You should start wearing the splint for your finger on the left index finger.  Please pick up the brace for your elbow. Please do the exercises provided. I would like to see you back in 1 month to see how your hand and wrist are doing.

## 2014-11-02 NOTE — Assessment & Plan Note (Signed)
Apparent tennis elbow on left. Will treat with bracing and exercises. Advised could ice the area as well.  Continue tylenol. Avoid NSAIDs due to renal function and HTN.

## 2014-11-02 NOTE — Assessment & Plan Note (Signed)
A1c previously at goal. Tolerating medication. Will continue metformin and recheck A1c in 2 months.

## 2014-11-02 NOTE — Assessment & Plan Note (Signed)
Possible trigger finger on left hand. Discussed bracing (already has brace at home) and icing the area. Patient declined injection when this was mentioned.

## 2014-11-02 NOTE — Progress Notes (Signed)
Patient ID: Erin Coffey, female   DOB: [birth year], 69 y.o.   MRN: [id number]  [name], MD Phone: [phone]  Erin Coffey is a 69 y.o. female who presents today for f/u.  HYPERTENSION Disease Monitoring Home BP Monitoring last checked 2 days ago and was 147/68, typically in the 0000000 systolic. Highest has been 154/78 Chest pain- no    Dyspnea- no Medications Compliance-  Taking metoprolol and diovan.   Edema- no  DIABETES Disease Monitoring: Blood Sugar ranges-not checking Polyuria/phagia/dipsia- no      Optho: is to see in june Medications: Compliance- taking metformin Hypoglycemic symptoms- no  Left elbow pain: notes for past 2-3 weeks has had pain around her lateral epicondyle in left elbow that shoots down the back of her arm to her hand. Occurs intermittently with movement of the elbow. No history of this. No specific injury. Notes some weakness relating to pain. No numbness. Has not improved. Tylenol helps some.   Also notes some discomfort in left palmar surface index finger with movement. Notes history of trigger finger previous, though cannot remember which hand it was in.    PMH: nonsmoker.   ROS: Per HPI   Physical Exam Filed Vitals:   11/02/14 0858  BP: 138/78  Pulse:   Temp:     Gen: Well NAD HEENT: PERRL,  MMM Lungs: CTABL Nl WOB Heart: RRR  MSK: left lateral epicondyle with mild tenderness, pain is reproduced with resisted supination, no tenderness along any other aspect of left arm, no apparent tenderness of any finger in left hand, no nodularity, mild decrease in flexion of left index finger, right UE normal Neuro: 5/5 strength in bilateral biceps, triceps, grip, sensation to light touch intact in bilateral UE Exts: Non edematous BL  LE, warm and well perfused.    Assessment/Plan: Please see individual problem list.  [name], MD [place] PGY-3

## 2014-11-02 NOTE — Assessment & Plan Note (Addendum)
Improved on recheck though still not at goal. Patient reports just seeing [facility], so will request records to see if they made any recent changes to her medication regimen and to evaluate her lab results. Will likely add amlodipine to her regimen once confirming no recent changes at nephrology appointment.

## 2014-11-08 ENCOUNTER — Telehealth: Payer: Self-pay | Admitting: Family Medicine

## 2014-11-08 MED ORDER — AMLODIPINE BESYLATE 5 MG PO TABS: 5.0000 mg | ORAL_TABLET | Freq: Every day | ORAL | Status: DC

## 2014-11-08 NOTE — Telephone Encounter (Signed)
Received and reviewed [facility] most recent notes on this patient. Her BP was high at their last visit and they recommended starting a low dose amlodipine if it remained high. Given her CKD and DM her BP goal should be <130/90. BP has been higher than this recently, so will add amlodipine 5 mg daily. She will follow-up with me at the end of April for recheck.

## 2014-11-17 ENCOUNTER — Other Ambulatory Visit: Payer: Self-pay | Admitting: Family Medicine

## 2014-11-30 ENCOUNTER — Ambulatory Visit: Payer: Medicare Other

## 2014-12-07 ENCOUNTER — Encounter: Payer: Self-pay | Admitting: Family Medicine

## 2014-12-07 ENCOUNTER — Ambulatory Visit (INDEPENDENT_AMBULATORY_CARE_PROVIDER_SITE_OTHER): Payer: Medicare Other | Admitting: Family Medicine

## 2014-12-07 ENCOUNTER — Ambulatory Visit (INDEPENDENT_AMBULATORY_CARE_PROVIDER_SITE_OTHER): Payer: Medicare Other | Admitting: *Deleted

## 2014-12-07 VITALS — BP 124/70 | Temp 98.3°F | Ht 69.0 in | Wt 281.0 lb

## 2014-12-07 DIAGNOSIS — G5600 Carpal tunnel syndrome, unspecified upper limb: Secondary | ICD-10-CM | POA: Insufficient documentation

## 2014-12-07 DIAGNOSIS — I10 Essential (primary) hypertension: Secondary | ICD-10-CM

## 2014-12-07 DIAGNOSIS — T148XXA Other injury of unspecified body region, initial encounter: Secondary | ICD-10-CM | POA: Insufficient documentation

## 2014-12-07 DIAGNOSIS — Z7901 Long term (current) use of anticoagulants: Secondary | ICD-10-CM

## 2014-12-07 DIAGNOSIS — T148 Other injury of unspecified body region: Secondary | ICD-10-CM | POA: Diagnosis not present

## 2014-12-07 DIAGNOSIS — G5602 Carpal tunnel syndrome, left upper limb: Secondary | ICD-10-CM

## 2014-12-07 DIAGNOSIS — Z86711 Personal history of pulmonary embolism: Secondary | ICD-10-CM

## 2014-12-07 DIAGNOSIS — E119 Type 2 diabetes mellitus without complications: Secondary | ICD-10-CM

## 2014-12-07 HISTORY — DX: Carpal tunnel syndrome, unspecified upper limb: G56.00

## 2014-12-07 LAB — LIPID PANEL
Cholesterol: 201 mg/dL — ABNORMAL HIGH (ref 0–200)
HDL: 83 mg/dL (ref >=46)
LDL Cholesterol: 106 mg/dL — ABNORMAL HIGH (ref 0–99)
Total CHOL/HDL Ratio: 2.4 Ratio
Triglycerides: 60 mg/dL (ref <150)
VLDL: 12 mg/dL (ref 0–40)

## 2014-12-07 LAB — BASIC METABOLIC PANEL
BUN: 14 mg/dL (ref 6–23)
CO2: 24 mEq/L (ref 19–32)
Calcium: 9 mg/dL (ref 8.4–10.5)
Chloride: 104 mEq/L (ref 96–112)
Creat: 1.13 mg/dL — ABNORMAL HIGH (ref 0.50–1.10)
Glucose, Bld: 89 mg/dL (ref 70–99)
Potassium: 4.2 mEq/L (ref 3.5–5.3)
Sodium: 138 mEq/L (ref 135–145)

## 2014-12-07 LAB — POCT INR: INR: 3.6

## 2014-12-07 LAB — POCT GLYCOSYLATED HEMOGLOBIN (HGB A1C): Hemoglobin A1C: 5.8

## 2014-12-07 MED ORDER — WRIST SPLINT/COCK-UP/LEFT L MISC: 1.0000 | Freq: Every day | Status: DC

## 2014-12-07 NOTE — Assessment & Plan Note (Signed)
Improved control with addition of amlodipine. Will check BMET today. Continue valsartan, amlodipine, and metoprolol.

## 2014-12-07 NOTE — Progress Notes (Signed)
Patient ID: Erin Coffey, female   DOB: Aug 11, 1946, 69 y.o.   MRN: [id number]  [name], MD Phone: [phone]  Erin Coffey is a 69 y.o. female who presents today for f/u.  DIABETES Disease Monitoring: Blood Sugar ranges-not checking Polyuria/phagia/dipsia- no       Medications: Compliance- taking metformin  Hypoglycemic symptoms- no  HYPERTENSION Disease Monitoring Home BP Monitoring 136/67 right after our machine today, 119/60 this morning Chest pain- no    Dyspnea- no Medications Compliance-  Taking amlodipine, valsartan, and metoprolol.  Edema- no  Left hand pain: notes one month of left hand pain in the palm. Associated with pain on clenching fist and decreased ability to clinch fist. No other strength issues. No numbness. Has history of RA (though no apparent lab work in out system), trigger finger, and right handed carpal tunnel. States this feels like carpal tunnel to her. Not much relief with tylenol or tramadol. Denies fevers and erythema.  In the process of giving the patient her AVS she brought up a nickel sized bruise appearing lesion on her right anteromedial knee. She had not noticed this previously. There is no pain in the area. No injury to the area. No mass.  PMH: nonsmoker.   ROS: Per HPI   Physical Exam Filed Vitals:   12/07/14 0848  BP: 124/70  Temp: 98.3 F (36.8 C)    Gen: Well NAD HEENT: PERRL,  MMM Lungs: CTABL Nl WOB Heart: RRR  Skin: right anteromedial knee with nickel sized dark blue/black/violaceous flat lesion, no underlying nodules or masses, nontender MSK: left tenel and phalen testing positive, no wrist swelling, no erythema of bilateral hands Neuro: CN 2-12 intact, 4/5 strength left grip, 5/5 strength in right grip, bilateral biceps, triceps, wrist extension, flexion, ulnar deviation, radial deviation, finger abductors, quads, hamstrings, plantar and dorsiflexion, sensation to light touch intact in bilateral UE and LE,  normal gait Exts: Non edematous BL  LE, warm and well perfused.    Assessment/Plan: Please see individual problem list.  [name], MD [place] PGY-3

## 2014-12-07 NOTE — Patient Instructions (Addendum)
Nice to see you. We are going to refer you to orthopedic surgery for evaluation of your wrist. You likely have carpal tunnel and this is leading to your discomfort and grip issues.  If this gets worse or you develop fever or redness please let us know.  Please keep an eye on the spot on your leg. If it gets bigger, becomes painful, develops a mass, or you develop fevers or sweats please let us know.

## 2014-12-07 NOTE — Assessment & Plan Note (Signed)
A1c well controlled. Will need to consider trial off metformin in the future.

## 2014-12-07 NOTE — Assessment & Plan Note (Signed)
Small apparent bruise on knee. No underlying lesion or firmness to indicate a mass. Discussed that this is likely a bruise and she should monitor this. If this enlarges, becomes painful, or a mass appears, or develops fevers or sweats she will follow-up.

## 2014-12-07 NOTE — Assessment & Plan Note (Addendum)
Exam and history consistent with carpal tunnel. Left sided with mild decrease in grip strength, otherwise neurologically intact. No erythema or fever to indicate infection. Brace prescribed. Will refer to ortho for consideration of release surgery given grip issues and prior history of release performed on the right hand. Given return precautions.

## 2014-12-08 DIAGNOSIS — G5602 Carpal tunnel syndrome, left upper limb: Secondary | ICD-10-CM | POA: Diagnosis not present

## 2014-12-20 ENCOUNTER — Telehealth: Payer: Self-pay | Admitting: Family Medicine

## 2014-12-20 DIAGNOSIS — E785 Hyperlipidemia, unspecified: Secondary | ICD-10-CM

## 2014-12-20 MED ORDER — ATORVASTATIN CALCIUM 40 MG PO TABS: 40.0000 mg | ORAL_TABLET | Freq: Every day | ORAL | Status: DC

## 2014-12-20 NOTE — Telephone Encounter (Signed)
Called and advised patient of lipid panel results. Discussed that she would benefit from a statin to decrease the risk of MI and stroke. Patient expressed concern regarding how this would affect her liver and I advised that these medications have potential to affect the liver and we could check her LFTs. She is coming tomorrow for coumadin check and will have cmet at that time. Sent lipitor to the pharmacy, though advised not to fill until we know what her LFTs are. She voiced understanding.

## 2014-12-21 ENCOUNTER — Ambulatory Visit (INDEPENDENT_AMBULATORY_CARE_PROVIDER_SITE_OTHER): Payer: Medicare Other | Admitting: *Deleted

## 2014-12-21 DIAGNOSIS — E785 Hyperlipidemia, unspecified: Secondary | ICD-10-CM | POA: Diagnosis not present

## 2014-12-21 DIAGNOSIS — Z7901 Long term (current) use of anticoagulants: Secondary | ICD-10-CM | POA: Diagnosis not present

## 2014-12-21 DIAGNOSIS — Z86711 Personal history of pulmonary embolism: Secondary | ICD-10-CM | POA: Diagnosis not present

## 2014-12-21 LAB — COMPREHENSIVE METABOLIC PANEL
ALT: 14 U/L (ref 0–35)
AST: 22 U/L (ref 0–37)
Albumin: 3.3 g/dL — ABNORMAL LOW (ref 3.5–5.2)
Alkaline Phosphatase: 62 U/L (ref 39–117)
BUN: 11 mg/dL (ref 6–23)
CO2: 21 mEq/L (ref 19–32)
Calcium: 9.2 mg/dL (ref 8.4–10.5)
Chloride: 105 mEq/L (ref 96–112)
Creat: 1.27 mg/dL — ABNORMAL HIGH (ref 0.50–1.10)
Glucose, Bld: 98 mg/dL (ref 70–99)
Potassium: 4.8 mEq/L (ref 3.5–5.3)
Sodium: 138 mEq/L (ref 135–145)
Total Bilirubin: 0.6 mg/dL (ref 0.2–1.2)
Total Protein: 7.4 g/dL (ref 6.0–8.3)

## 2014-12-21 LAB — POCT INR: INR: 2.5

## 2014-12-21 NOTE — Progress Notes (Signed)
Drew CMET

## 2014-12-28 ENCOUNTER — Telehealth: Payer: Self-pay | Admitting: Family Medicine

## 2014-12-28 DIAGNOSIS — I10 Essential (primary) hypertension: Secondary | ICD-10-CM

## 2014-12-28 NOTE — Telephone Encounter (Signed)
Called and informed of CMET results. LFTs normal. Cr up minimally from 1.13 to 1.27. She has not started any new medications and has not been taking any NSAIDs. Advised that this increase could be within lab error or might represent worsening of kidney function. Will plan to recheck this at her next coumadin clinic visit on 01/18/15. Will place future order.

## 2015-01-03 ENCOUNTER — Other Ambulatory Visit: Payer: Self-pay | Admitting: Family Medicine

## 2015-01-03 DIAGNOSIS — N189 Chronic kidney disease, unspecified: Secondary | ICD-10-CM

## 2015-01-18 ENCOUNTER — Ambulatory Visit (INDEPENDENT_AMBULATORY_CARE_PROVIDER_SITE_OTHER): Payer: Medicare Other | Admitting: *Deleted

## 2015-01-18 DIAGNOSIS — Z86711 Personal history of pulmonary embolism: Secondary | ICD-10-CM | POA: Diagnosis not present

## 2015-01-18 DIAGNOSIS — N189 Chronic kidney disease, unspecified: Secondary | ICD-10-CM | POA: Diagnosis not present

## 2015-01-18 DIAGNOSIS — Z7901 Long term (current) use of anticoagulants: Secondary | ICD-10-CM

## 2015-01-18 LAB — BASIC METABOLIC PANEL
BUN: 16 mg/dL (ref 6–23)
CO2: 23 mEq/L (ref 19–32)
Calcium: 9.4 mg/dL (ref 8.4–10.5)
Chloride: 107 mEq/L (ref 96–112)
Creat: 1.19 mg/dL — ABNORMAL HIGH (ref 0.50–1.10)
Glucose, Bld: 125 mg/dL — ABNORMAL HIGH (ref 70–99)
Potassium: 4.1 mEq/L (ref 3.5–5.3)
Sodium: 140 mEq/L (ref 135–145)

## 2015-01-18 LAB — POCT INR: INR: 2.6

## 2015-01-22 ENCOUNTER — Encounter: Payer: Self-pay | Admitting: Family Medicine

## 2015-01-22 DIAGNOSIS — E119 Type 2 diabetes mellitus without complications: Secondary | ICD-10-CM | POA: Diagnosis not present

## 2015-01-22 LAB — HM DIABETES EYE EXAM

## 2015-02-15 ENCOUNTER — Ambulatory Visit (INDEPENDENT_AMBULATORY_CARE_PROVIDER_SITE_OTHER): Payer: Medicare Other | Admitting: *Deleted

## 2015-02-15 DIAGNOSIS — Z7901 Long term (current) use of anticoagulants: Secondary | ICD-10-CM

## 2015-02-15 DIAGNOSIS — Z86711 Personal history of pulmonary embolism: Secondary | ICD-10-CM

## 2015-02-15 LAB — POCT INR: INR: 2.6

## 2015-02-20 ENCOUNTER — Other Ambulatory Visit: Payer: Self-pay | Admitting: *Deleted

## 2015-02-20 MED ORDER — TRIAMCINOLONE ACETONIDE 0.1 % EX CREA: TOPICAL_CREAM | Freq: Two times a day (BID) | CUTANEOUS | Status: DC

## 2015-02-20 NOTE — Telephone Encounter (Signed)
Patient called with prescription request for Triamcinolone Cream 0.1%. Please accept prescription request, however please only provide 2 refills. (I am unable to e-prescribe). Thank you!

## 2015-02-20 NOTE — Telephone Encounter (Signed)
Dr. Dallas Schimke for instructions how often do you want pt to use cream? Epifania Littrell, CMA.

## 2015-03-05 MED ORDER — TRIAMCINOLONE ACETONIDE 0.1 % EX CREA: TOPICAL_CREAM | Freq: Two times a day (BID) | CUTANEOUS | Status: DC

## 2015-03-05 NOTE — Addendum Note (Signed)
Addended by: Derl Barrow on: 03/05/2015 11:19 AM   Modules accepted: Orders

## 2015-03-05 NOTE — Telephone Encounter (Signed)
Pt is calling about this request. States the pharmacy has not received it and really needs it. Thanks, [employer], ASA

## 2015-03-22 ENCOUNTER — Ambulatory Visit (INDEPENDENT_AMBULATORY_CARE_PROVIDER_SITE_OTHER): Payer: Medicare Other | Admitting: *Deleted

## 2015-03-22 DIAGNOSIS — Z7901 Long term (current) use of anticoagulants: Secondary | ICD-10-CM | POA: Diagnosis not present

## 2015-03-22 DIAGNOSIS — Z86711 Personal history of pulmonary embolism: Secondary | ICD-10-CM

## 2015-03-22 LAB — POCT INR: INR: 3.4

## 2015-04-05 ENCOUNTER — Ambulatory Visit: Payer: Medicare Other

## 2015-04-06 ENCOUNTER — Ambulatory Visit (INDEPENDENT_AMBULATORY_CARE_PROVIDER_SITE_OTHER): Payer: Medicare Other | Admitting: *Deleted

## 2015-04-06 DIAGNOSIS — Z7901 Long term (current) use of anticoagulants: Secondary | ICD-10-CM

## 2015-04-06 DIAGNOSIS — Z86711 Personal history of pulmonary embolism: Secondary | ICD-10-CM | POA: Diagnosis not present

## 2015-04-06 LAB — POCT INR: INR: 2.9

## 2015-04-13 ENCOUNTER — Ambulatory Visit: Payer: Medicare Other | Admitting: Internal Medicine

## 2015-04-17 ENCOUNTER — Other Ambulatory Visit: Payer: Self-pay | Admitting: Family Medicine

## 2015-04-19 ENCOUNTER — Encounter: Payer: Self-pay | Admitting: Family Medicine

## 2015-04-19 ENCOUNTER — Ambulatory Visit (INDEPENDENT_AMBULATORY_CARE_PROVIDER_SITE_OTHER): Payer: Medicare Other | Admitting: Family Medicine

## 2015-04-19 VITALS — BP 145/56 | HR 59 | Temp 98.5°F | Ht 69.0 in | Wt 277.4 lb

## 2015-04-19 DIAGNOSIS — Z7289 Other problems related to lifestyle: Secondary | ICD-10-CM | POA: Diagnosis not present

## 2015-04-19 DIAGNOSIS — Z609 Problem related to social environment, unspecified: Secondary | ICD-10-CM

## 2015-04-19 DIAGNOSIS — Z0000 Encounter for general adult medical examination without abnormal findings: Secondary | ICD-10-CM | POA: Diagnosis not present

## 2015-04-19 DIAGNOSIS — Z23 Encounter for immunization: Secondary | ICD-10-CM | POA: Diagnosis not present

## 2015-04-19 DIAGNOSIS — Z114 Encounter for screening for human immunodeficiency virus [HIV]: Secondary | ICD-10-CM

## 2015-04-19 MED ORDER — METOPROLOL TARTRATE 50 MG PO TABS: 50.0000 mg | ORAL_TABLET | Freq: Two times a day (BID) | ORAL | Status: DC

## 2015-04-19 MED ORDER — POLYSACCHARIDE IRON COMPLEX 150 MG PO CAPS: 150.0000 mg | ORAL_CAPSULE | Freq: Two times a day (BID) | ORAL | Status: DC

## 2015-04-19 NOTE — Patient Instructions (Addendum)
  Erin Coffey , Thank you for taking time to come for your Medicare Wellness Visit. I appreciate your ongoing commitment to your health goals. Please review the following plan we discussed and let me know if I can assist you in the future.   These are the goals we discussed: Goals    . Have 3 meals a day       This is a list of the screening recommended for you and due dates:  Health Maintenance  Topic Date Due  .  Hepatitis C: One time screening is recommended by Center for Disease Control  (CDC) for  adults born from [birth year] through 1963.   January 28, 1946  . DEXA scan (bone density measurement)  02/25/2013  . Complete foot exam   12/24/2014  . Flu Shot  03/12/2015  . Hemoglobin A1C  06/08/2015  . Mammogram  10/25/2015  . Eye exam for diabetics  01/22/2016  . Colon Cancer Screening  11/07/2019  . Tetanus Vaccine  01/21/2023  . Shingles Vaccine  Completed  . Pneumonia vaccines  Completed   Major Risk Factors needing evaluation or treatment  Family History of Heart Disease  Obesity Yes  Diabetes Yes   Hypertension Yes   Hypercholesterolemia Yes  Inactivity Yes   At risk of falling No  Inadequate support  Yes  Abuse or Neglect No  Smoking No  Second-hand smoke exposure No   Cardiovascular disease No   Alcohol use No   Oral Health Yes Teeth extraction and dentures  Hearing Impairment No  Vision Impairment No   Pain / Fatigue Yes   Memory Impairment No  Difficulty taking medications as directedNo  Driving Safety No  Depression No

## 2015-04-19 NOTE — Progress Notes (Signed)
Subjective:   Erin Coffey is a 69 y.o. female who presents for Medicare Annual (Subsequent) preventive examination.  Review of Systems:   MiniCog Coffey: Passed        Objective:     Vitals: BP 145/56 mmHg  Pulse 59  Temp(Src) 98.5 F (36.9 C) (Oral)  Ht 5\' 9"  (1.753 m)  Wt 277 lb 6 oz (125.816 kg)  BMI 40.94 kg/m2  Tobacco History  Smoking status  . Never Smoker   Smokeless tobacco  . Never Used     Counseling given: Not Answered   Past Medical History  Diagnosis Date  . Allergy   . Arthritis   . Anemia, iron deficiency   . Diabetes mellitus   . GERD (gastroesophageal reflux disease)   . Hypertension   . Chronic kidney disease   . Thyroid disease   . Pulmonary embolism 04/28/11   Past Surgical History  Procedure Laterality Date  . Abdominal hysterectomy    . Tubal ligation    . Knee arthroscopy  Bilateral  . Cardiac catheterization  04/29/11    Clean cardiac cath, no blockages.    Family History  Problem Relation Age of Onset  . Diabetes Mother   . Heart disease Mother   . Hypertension Mother   . Heart disease Father   . Hypertension Father   . Diabetes Sister   . Heart disease Sister   . Hypertension Brother   . Cancer Sister 61    lukemia  . Cirrhosis Sister     alcohol  . Kidney disease Sister   . Diabetes Sister   . Diabetes Brother    History  Sexual Activity  . Sexual Activity: Not Currently    Outpatient Encounter Prescriptions as of 04/19/2015  Medication Sig  . amLODipine (NORVASC) 5 MG tablet Take 1 tablet (5 mg total) by mouth daily.  06/19/2015 atorvastatin (LIPITOR) 40 MG tablet Take 1 tablet (40 mg total) by mouth daily.  . Blood Glucose Monitoring Suppl (CONTOUR BLOOD GLUCOSE SYSTEM) W/DEVICE KIT Check blood sugars once daily in am.  . Blood Glucose Monitoring Suppl (RELION CONFIRM GLUCOSE MONITOR) W/DEVICE KIT by Does not apply route.  . docusate sodium (COLACE) 100 MG capsule Take 250 mg by mouth daily.   . Elastic  Bandages & Supports (TENNIS ELBOW STRAP) MISC 1 Device by Does not apply route daily.  . Elastic Bandages & Supports (WRIST SPLINT/COCK-UP/LEFT L) MISC 1 Device by Does not apply route daily.  [name] FERREX 150 150 MG capsule take 1 capsule by mouth twice a day  . fish oil-omega-3 fatty acids 1000 MG capsule Take 1,200 mg by mouth daily.   . metFORMIN (GLUCOPHAGE) 500 MG tablet take 1 tablet by mouth twice a day with food  . metoprolol (LOPRESSOR) 50 MG tablet take 1 tablet by mouth twice a day  . omeprazole (PRILOSEC) 20 MG capsule Take 20 mg by mouth daily.    . traMADol (ULTRAM) 50 MG tablet Take 1 tablet (50 mg total) by mouth every 6 (six) hours as needed.  . triamcinolone cream (KENALOG) 0.1 % Apply topically 2 (two) times daily.  . valsartan (DIOVAN) 320 MG tablet Take 1 tablet (320 mg total) by mouth daily.  [name] warfarin (COUMADIN) 5 MG tablet take 1-2 tablets by mouth every evening (START WITH 1 AND 1/2 TABLETS DAILY)  . Acetaminophen-Codeine (TYLENOL/CODEINE #3) 300-30 MG per tablet Take 1 tablet by mouth every 4 (four) hours as needed for pain. (Patient not  taking: Reported on 04/19/2015)  . baclofen (LIORESAL) 10 MG tablet Take 1 tablet (10 mg total) by mouth 3 (three) times daily. (Patient not taking: Reported on 04/19/2015)  . glucose blood (BAYER CONTOUR Coffey) Coffey strip Check blood sugars two times a day   No facility-administered encounter medications on file as of 04/19/2015.    Activities of Daily Living No flowsheet data found.  Patient Care Team: Smiley Houseman, MD as PCP - General Marica Otter, OD (Optometry) Jamal Maes, MD (Nephrology)    Assessment:     Exercise Activities and Dietary recommendations   Take time to eat three meals a day  Goals    . Have 3 meals a day      Fall Risk Fall Risk  04/19/2015 12/07/2014 11/02/2014 07/03/2014 10/27/2013  Falls in the past year? No No No No Yes  Number falls in past yr: - - - - 1  Injury with Fall? - - - - Yes  Risk  Factor Category  - - - - High Fall Risk  Risk for fall due to : - - - - Impaired balance/gait   Depression Screen PHQ 2/9 Scores 04/19/2015 12/07/2014 11/02/2014 07/03/2014  PHQ - 2 Score 0 0 0 1     Cognitive Testing MMSE - Mini Mental State Exam 10/27/2013  Orientation to time 5  Orientation to Place 5  Registration 3  Attention/ Calculation 5  Recall 3  Language- name 2 objects 2  Language- repeat 1  Language- follow 3 step command 3  Language- read & follow direction 1  Write a sentence 1  Copy design 1  Total score 30    Immunization History  Administered Date(s) Administered  . Influenza Split 05/15/2011, 04/29/2012  . Influenza Whole 06/05/2008, 08/08/2009  . Influenza,inj,Quad PF,36+ Mos 05/05/2013, 06/08/2014  . Pneumococcal Conjugate-13 05/05/2013  . Pneumococcal Polysaccharide-23 08/24/2014  . Td 02/08/2002  . Tdap 01/20/2013  . Zoster 12/20/2012   Screening Tests Health Maintenance  Topic Date Due  . Hepatitis C Screening  03/05/1946  . DEXA SCAN  02/25/2013  . FOOT EXAM  12/24/2014  . INFLUENZA VACCINE  03/12/2015  . URINE MICROALBUMIN  12/09/2016 (Originally 02/25/1958)  . HEMOGLOBIN A1C  06/08/2015  . MAMMOGRAM  10/25/2015  . OPHTHALMOLOGY EXAM  01/22/2016  . COLONOSCOPY  11/07/2019  . TETANUS/TDAP  01/21/2023  . ZOSTAVAX  Completed  . PNA vac Low Risk Adult  Completed      Plan:    During the course of the visit the patient was educated and counseled about the following appropriate screening and preventive services:   Vaccines to include Pneumoccal, Influenza, Hepatitis B, Td, Zostavax, HCV  Electrocardiogram  Cardiovascular Disease  Colorectal cancer screening  Bone density screening  Diabetes screening  Glaucoma screening  Mammography/PAP  Nutrition counseling   Patient Instructions (the written plan) was given to the patient.   Renesmae Donahey D, MD  04/19/2015

## 2015-04-20 ENCOUNTER — Encounter: Payer: Self-pay | Admitting: Family Medicine

## 2015-04-20 ENCOUNTER — Other Ambulatory Visit: Payer: Self-pay | Admitting: Family Medicine

## 2015-04-20 LAB — HIV ANTIBODY (ROUTINE TESTING W REFLEX): HIV 1&2 Ab, 4th Generation: NONREACTIVE

## 2015-04-20 LAB — HEPATITIS C ANTIBODY: HCV Ab: NEGATIVE

## 2015-04-23 ENCOUNTER — Encounter: Payer: Self-pay | Admitting: Family Medicine

## 2015-04-26 ENCOUNTER — Other Ambulatory Visit: Payer: Self-pay | Admitting: Family Medicine

## 2015-04-30 ENCOUNTER — Other Ambulatory Visit: Payer: Self-pay | Admitting: Internal Medicine

## 2015-04-30 NOTE — Telephone Encounter (Signed)
Pt called and needs refills on her Warfarin and Tramadol. jw

## 2015-04-30 NOTE — Telephone Encounter (Signed)
Hello Erin Coffey, This patient requested a refill of her coumadin. I have a quick question about her dosing. According to her INR check visits, she is currently on 5mg  Monday and Thursday, then 7.5mg  all other days of the week. Her prescription that she requested doesn't specifically mention this dosing regimen. Do I need to change her dosing on her prescription? Thank you for your help.

## 2015-05-01 MED ORDER — WARFARIN SODIUM 5 MG PO TABS: ORAL_TABLET | ORAL | Status: DC

## 2015-05-01 MED ORDER — TRAMADOL HCL 50 MG PO TABS: 50.0000 mg | ORAL_TABLET | Freq: Four times a day (QID) | ORAL | Status: DC | PRN

## 2015-05-01 NOTE — Telephone Encounter (Signed)
It's best to write take as directed under free text instead of a specific dose as the dosing may change as results may vary. Some patient's have been denied refills by the pharmacy because the dosing instructions say they should not be out after we changed their dosing instructions.

## 2015-05-02 ENCOUNTER — Other Ambulatory Visit: Payer: Self-pay | Admitting: Neurosurgery

## 2015-05-02 DIAGNOSIS — M546 Pain in thoracic spine: Secondary | ICD-10-CM

## 2015-05-02 NOTE — Telephone Encounter (Signed)
Please call in.  Thank you

## 2015-05-02 NOTE — Telephone Encounter (Signed)
Message left for pharmacy with tramadol refill. Merve Hotard,CMA

## 2015-05-03 ENCOUNTER — Ambulatory Visit: Payer: Medicare Other

## 2015-05-03 ENCOUNTER — Ambulatory Visit (INDEPENDENT_AMBULATORY_CARE_PROVIDER_SITE_OTHER): Payer: Medicare Other | Admitting: *Deleted

## 2015-05-03 DIAGNOSIS — Z7901 Long term (current) use of anticoagulants: Secondary | ICD-10-CM

## 2015-05-03 DIAGNOSIS — Z86711 Personal history of pulmonary embolism: Secondary | ICD-10-CM | POA: Diagnosis not present

## 2015-05-03 LAB — POCT INR: INR: 3.4

## 2015-05-10 ENCOUNTER — Other Ambulatory Visit: Payer: Self-pay | Admitting: Internal Medicine

## 2015-05-10 ENCOUNTER — Ambulatory Visit (INDEPENDENT_AMBULATORY_CARE_PROVIDER_SITE_OTHER): Payer: Medicare Other | Admitting: Family Medicine

## 2015-05-10 ENCOUNTER — Other Ambulatory Visit: Payer: Self-pay | Admitting: Family Medicine

## 2015-05-10 ENCOUNTER — Encounter: Payer: Self-pay | Admitting: Family Medicine

## 2015-05-10 VITALS — BP 173/64 | HR 61 | Temp 98.7°F | Ht 69.0 in | Wt 274.9 lb

## 2015-05-10 DIAGNOSIS — B360 Pityriasis versicolor: Secondary | ICD-10-CM | POA: Diagnosis not present

## 2015-05-10 MED ORDER — KETOCONAZOLE 2 % EX CREA: 1.0000 "application " | TOPICAL_CREAM | Freq: Two times a day (BID) | CUTANEOUS | Status: DC

## 2015-05-10 MED ORDER — METFORMIN HCL 500 MG PO TABS: 500.0000 mg | ORAL_TABLET | Freq: Two times a day (BID) | ORAL | Status: DC

## 2015-05-10 NOTE — Patient Instructions (Signed)
The rash you are having is a skin fungal infection. Fungus grows in moist, dark areas such as under the breasts.  Apply the ketoconazole antifungal medicine twice a day under the breasts and groin.   Once improved you should dry and keep the areas dry to prevent the fungus from growing back. You can use over the counter dry powder to help with this if needed.  You can continue to use the over the counter steroid cream you have as well in about 1-2 weeks. For now just use the antifungal cream.  For the boil under your stomach, apply warm compresses, if it starts draining a lot of fluid or gets painful, grows in size you should come back to the clinic.

## 2015-05-11 NOTE — Progress Notes (Signed)
   Subjective:    Patient ID: Erin Coffey, female    DOB: 07/23/1946, 69 y.o.   MRN: [id number]  HPI  Patient presents for Same Day Appointment  CC: Rash  # Rash:  Under both breasts and groin  Very itchy  Using otc steroid cream with some improvement ROS: no fevers or chills  Social Hx: never smoker  Review of Systems   See HPI for ROS. All other systems reviewed and are negative.  Past medical history, surgical, family, and social history reviewed and updated in the EMR as appropriate.  Objective:  BP 173/64 mmHg  Pulse 61  Temp(Src) 98.7 F (37.1 C) (Oral)  Ht 5\' 9"  (1.753 m)  Wt 274 lb 14.4 oz (124.694 kg)  BMI 40.58 kg/m2 Vitals and nursing note reviewed  Chaperone breast for exam  General: NAD Skin: erythematous annular rash underneath both breasts and groin consistent with candida   Assessment & Plan:  Tinea versicolor - rx ketoconazole cream. See avs for patient instructions. Follow up if not improved.

## 2015-05-14 ENCOUNTER — Ambulatory Visit
Admission: RE | Admit: 2015-05-14 | Discharge: 2015-05-14 | Disposition: A | Discharge status: Home / Self Care | Payer: Medicare Other | Source: Ambulatory Visit | Attending: Neurosurgery | Admitting: Neurosurgery

## 2015-05-14 DIAGNOSIS — M546 Pain in thoracic spine: Secondary | ICD-10-CM

## 2015-05-14 MED ORDER — GADOBENATE DIMEGLUMINE 529 MG/ML IV SOLN
20.0000 mL | Freq: Once | INTRAVENOUS | Status: AC | PRN
  Administered 2015-05-14: 20 mL via INTRAVENOUS

## 2015-05-16 ENCOUNTER — Ambulatory Visit: Payer: Medicare Other

## 2015-05-17 ENCOUNTER — Ambulatory Visit (INDEPENDENT_AMBULATORY_CARE_PROVIDER_SITE_OTHER): Payer: Medicare Other | Admitting: *Deleted

## 2015-05-17 DIAGNOSIS — Z7901 Long term (current) use of anticoagulants: Secondary | ICD-10-CM | POA: Diagnosis not present

## 2015-05-17 DIAGNOSIS — Z86711 Personal history of pulmonary embolism: Secondary | ICD-10-CM

## 2015-05-17 LAB — POCT INR: INR: 2.9

## 2015-05-31 ENCOUNTER — Ambulatory Visit (INDEPENDENT_AMBULATORY_CARE_PROVIDER_SITE_OTHER): Payer: Medicare Other | Admitting: *Deleted

## 2015-05-31 DIAGNOSIS — Z7901 Long term (current) use of anticoagulants: Secondary | ICD-10-CM

## 2015-05-31 DIAGNOSIS — Z86711 Personal history of pulmonary embolism: Secondary | ICD-10-CM | POA: Diagnosis not present

## 2015-05-31 LAB — POCT INR: INR: 3

## 2015-06-14 ENCOUNTER — Ambulatory Visit (INDEPENDENT_AMBULATORY_CARE_PROVIDER_SITE_OTHER): Payer: Medicare Other | Admitting: *Deleted

## 2015-06-14 DIAGNOSIS — Z86711 Personal history of pulmonary embolism: Secondary | ICD-10-CM | POA: Diagnosis not present

## 2015-06-14 DIAGNOSIS — Z7901 Long term (current) use of anticoagulants: Secondary | ICD-10-CM

## 2015-06-14 LAB — POCT INR: INR: 2.2

## 2015-06-25 ENCOUNTER — Encounter: Payer: Self-pay | Admitting: Internal Medicine

## 2015-06-25 ENCOUNTER — Ambulatory Visit (INDEPENDENT_AMBULATORY_CARE_PROVIDER_SITE_OTHER): Payer: Medicare Other | Admitting: *Deleted

## 2015-06-25 ENCOUNTER — Ambulatory Visit (INDEPENDENT_AMBULATORY_CARE_PROVIDER_SITE_OTHER): Payer: Medicare Other | Admitting: Internal Medicine

## 2015-06-25 VITALS — BP 132/80 | HR 50 | Temp 98.3°F | Ht 69.0 in | Wt 273.0 lb

## 2015-06-25 DIAGNOSIS — Z86711 Personal history of pulmonary embolism: Secondary | ICD-10-CM | POA: Diagnosis not present

## 2015-06-25 DIAGNOSIS — M75102 Unspecified rotator cuff tear or rupture of left shoulder, not specified as traumatic: Secondary | ICD-10-CM | POA: Diagnosis not present

## 2015-06-25 DIAGNOSIS — Z7901 Long term (current) use of anticoagulants: Secondary | ICD-10-CM | POA: Diagnosis not present

## 2015-06-25 DIAGNOSIS — R252 Cramp and spasm: Secondary | ICD-10-CM | POA: Diagnosis not present

## 2015-06-25 DIAGNOSIS — I10 Essential (primary) hypertension: Secondary | ICD-10-CM

## 2015-06-25 DIAGNOSIS — E119 Type 2 diabetes mellitus without complications: Secondary | ICD-10-CM

## 2015-06-25 DIAGNOSIS — M67912 Unspecified disorder of synovium and tendon, left shoulder: Secondary | ICD-10-CM

## 2015-06-25 DIAGNOSIS — M67919 Unspecified disorder of synovium and tendon, unspecified shoulder: Secondary | ICD-10-CM | POA: Insufficient documentation

## 2015-06-25 LAB — POCT INR: INR: 2.8

## 2015-06-25 LAB — POCT GLYCOSYLATED HEMOGLOBIN (HGB A1C): Hemoglobin A1C: 5.6

## 2015-06-25 MED ORDER — BACLOFEN 10 MG PO TABS: 10.0000 mg | ORAL_TABLET | Freq: Every day | ORAL | Status: DC | PRN

## 2015-06-25 MED ORDER — PREDNISONE 20 MG PO TABS: 40.0000 mg | ORAL_TABLET | Freq: Every day | ORAL | Status: DC

## 2015-06-25 MED ORDER — METOPROLOL TARTRATE 25 MG PO TABS: 25.0000 mg | ORAL_TABLET | Freq: Two times a day (BID) | ORAL | Status: DC

## 2015-06-25 NOTE — Progress Notes (Signed)
Patient ID: Erin Coffey, female   DOB: 10-25-1945, 69 y.o.   MRN: [id number] Date of Visit: 06/25/2015   HPI: Left Shoulder Pain:  - intermittent (at rest or with activity) sharp/throbbing pain for the past few weeks. Pain starts in her posterior left shoulder and radiates down left arm. At times it radiates to her left neck and she has a pressure sensation in her left ear. Notes that her shoulder makes "cruchy noises"  - denies chest pain, sob, nausea associated with this; no history of MI/CVA - no numbness or weakness of left arm  - no recent injury - has history of OA and is seen by Percell Miller and [facility]; has not been seen there for about 1 yr - has been using Tylenol and Tramadol only at bedtime, which helps for a while. She does not like overusing this medication - has had steroid injections in the past bilaterally but states PO steroids work better for her   Bilateral Anterior Thigh Cramps:  - this occurs only when traveling for long periods on a plane  - notes of cramping and numbness that improve with "walking it out"  - chronic; has been using baclofen PRN when traveling  - no falls or weakness  Bradycardia: - denies dizziness or lightheadedness.  - takes medications as prescribed   ROS: See HPI.  [place]: reviewed   PHYSICAL EXAM: BP 132/80 mmHg  Pulse 50  Temp(Src) 98.3 F (36.8 C) (Oral)  Ht 5\' 9"  (1.753 m)  Wt 273 lb (123.832 kg)  BMI 40.30 kg/m2 GEN: NAD CV: RRR, no murmurs, rubs, or gallops PULM: CTAB, normal effort SKIN: No rash or cyanosis; warm and well-perfused EXTR: No lower extremity edema or calf tenderness MSK:  Shoulder: Symmetric. Right shoulder normal ROM. Left shoulder: pain limits ROM to 90 degrees of forward flexion, abduction and internal rotation. Normal strength bilaterally. Normal sensation to light touch. Tenderness to palpation bicipital groove and acromion; some soreness noted on musculature over scapula. NEER test positive. Hawkins  positive. Empty can test positive HIP: normal ROM, normal LE strength, normal patellar reflexes. Normal sensation to light touch bilaterally. Norma DP pulses bilaterally. No swelling of LE.  PSYCH: Mood and affect euthymic, normal rate and volume of speech NEURO: Awake, alert, no focal deficits grossly, normal speech   ASSESSMENT/PLAN:  Health maintenance:  - asked to make a well woman visit to discuss health maintenance   Rotator cuff dysfunction Special tests (neer, hawkins, empty can) positive. No weakness or decreased sensation.  - encouraged to take Tramadol and Tylenol more frequently than once a day in the short term - prednisone 40mg  for 5 days  - asked to follow up with ortho due to history of OA  Thigh cramp Symptoms possibly due to nerve palsy after prolonged sitting (traveling). Not consistent with claudication as symptoms of cramping and numbness improve with walking. No falls/weakness. No saddle anesthesia, no urinary incontinence/retention, no bowel incontinence. Exam is normal today.  - advised to ambulate frequently when in a plane  - Baclofen daily PRN     FOLLOW UP: - asked to make a well woman visit to discuss health maintenance   Smiley Houseman, MD [place]

## 2015-06-25 NOTE — Assessment & Plan Note (Addendum)
Symptoms possibly due to nerve palsy after prolonged sitting (traveling). Not consistent with claudication as symptoms of cramping and numbness improve with walking. No falls/weakness. No saddle anesthesia, no urinary incontinence/retention, no bowel incontinence. Exam is normal today.  - advised to ambulate frequently when in a plane  - Baclofen daily PRN

## 2015-06-25 NOTE — Assessment & Plan Note (Signed)
BP stable. Noted HR in 50s with repeat check. Denies dizziness and lightheadedness.  - Decrease dose of Metoprolol to 25mg  BID

## 2015-06-25 NOTE — Patient Instructions (Signed)
For your shoulder, you can take Tylenol and Tramadol more frequently than once a day until your symptoms improve. I also sent a steroid medication for your shoulder. Please follow up with your orthopedic doctor  We decreased the dose of your Metoprolol because your heart rate was slightly low  Follow up appointment for a well woman visit.

## 2015-06-25 NOTE — Assessment & Plan Note (Signed)
Special tests (neer, hawkins, empty can) positive. No weakness or decreased sensation.  - encouraged to take Tramadol and Tylenol more frequently than once a day in the short term - prednisone 40mg  for 5 days  - asked to follow up with ortho due to history of OA

## 2015-06-28 ENCOUNTER — Ambulatory Visit: Payer: Medicare Other

## 2015-07-26 ENCOUNTER — Ambulatory Visit (INDEPENDENT_AMBULATORY_CARE_PROVIDER_SITE_OTHER): Payer: Medicare Other | Admitting: *Deleted

## 2015-07-26 DIAGNOSIS — Z7901 Long term (current) use of anticoagulants: Secondary | ICD-10-CM | POA: Diagnosis not present

## 2015-07-26 LAB — POCT INR: INR: 3.1

## 2015-08-23 ENCOUNTER — Ambulatory Visit (INDEPENDENT_AMBULATORY_CARE_PROVIDER_SITE_OTHER): Payer: Medicare Other | Admitting: *Deleted

## 2015-08-23 DIAGNOSIS — Z7901 Long term (current) use of anticoagulants: Secondary | ICD-10-CM

## 2015-08-23 LAB — POCT INR: INR: 2.4

## 2015-09-20 ENCOUNTER — Ambulatory Visit (INDEPENDENT_AMBULATORY_CARE_PROVIDER_SITE_OTHER): Payer: Medicare Other | Admitting: *Deleted

## 2015-09-20 DIAGNOSIS — Z7901 Long term (current) use of anticoagulants: Secondary | ICD-10-CM | POA: Diagnosis not present

## 2015-09-20 LAB — POCT INR: INR: 2.4

## 2015-10-03 ENCOUNTER — Other Ambulatory Visit: Payer: Self-pay | Admitting: Internal Medicine

## 2015-10-03 NOTE — Telephone Encounter (Signed)
Pt called and would like a refill on her Prednisone. She said that this really helps with the inflammation. Jw

## 2015-10-05 MED ORDER — PREDNISONE 20 MG PO TABS: 40.0000 mg | ORAL_TABLET | Freq: Every day | ORAL | Status: DC

## 2015-10-05 NOTE — Telephone Encounter (Signed)
Please let patient know that Prednisone is not a long term medication. During our visit, we discussed that patient will follow up with ortho for her symptoms. I will refill for 5 days but she needs to follow up with Ortho

## 2015-10-05 NOTE — Telephone Encounter (Signed)
Spoke with patient and she is aware of this.  States that she has an appt with pcp on the 3rd also. Praveen Coia,CMA

## 2015-10-15 ENCOUNTER — Other Ambulatory Visit: Payer: Self-pay | Admitting: Family Medicine

## 2015-10-18 ENCOUNTER — Ambulatory Visit (INDEPENDENT_AMBULATORY_CARE_PROVIDER_SITE_OTHER): Payer: Medicare Other | Admitting: *Deleted

## 2015-10-18 DIAGNOSIS — Z7901 Long term (current) use of anticoagulants: Secondary | ICD-10-CM | POA: Diagnosis not present

## 2015-10-18 LAB — POCT INR: INR: 2.7

## 2015-10-19 ENCOUNTER — Ambulatory Visit (INDEPENDENT_AMBULATORY_CARE_PROVIDER_SITE_OTHER): Payer: Medicare Other | Admitting: Family Medicine

## 2015-10-19 VITALS — BP 133/50 | HR 60 | Temp 98.4°F | Ht 69.0 in | Wt 278.4 lb

## 2015-10-19 DIAGNOSIS — R195 Other fecal abnormalities: Secondary | ICD-10-CM | POA: Insufficient documentation

## 2015-10-19 LAB — CBC
HCT: 32.2 % — ABNORMAL LOW (ref 36.0–46.0)
Hemoglobin: 10.7 g/dL — ABNORMAL LOW (ref 12.0–15.0)
MCH: 28.2 pg (ref 26.0–34.0)
MCHC: 33.2 g/dL (ref 30.0–36.0)
MCV: 84.7 fL (ref 78.0–100.0)
MPV: 9.3 fL (ref 8.6–12.4)
Platelets: 210 10*3/uL (ref 150–400)
RBC: 3.8 MIL/uL — ABNORMAL LOW (ref 3.87–5.11)
RDW: 16.5 % — ABNORMAL HIGH (ref 11.5–15.5)
WBC: 4.4 10*3/uL (ref 4.0–10.5)

## 2015-10-19 NOTE — Assessment & Plan Note (Signed)
Dark stools for 3-4 days post likely due to Kaopectate use as well as iron supplements.  However, does have a history of diverticulosis on colonoscopy in 2011 as well as currently being treated with blood thinners for history of PE.  No signs of brisk GI bleed; She doesn't not have any chest pain, dizziness is not tachycardic.  Possible abdominal discomfort due to dyspepsia, gastritis related to recent steroid use.  - Check CBC and Hemoccult cards - Advised following up if develops chest pain, shortness of breath, frank bleeding or worsening abdominal pain - If CBC shows worsening anemia and Hemoccult cards positive, will refer back to GI for possible colonoscopy

## 2015-10-19 NOTE — Patient Instructions (Signed)
It was great seeing you today.  Your daughter.  Stools are most likely due to your iron supplements and Kaopectate.  However, we will check red blood cells today and sent to home with stool cards to check for bleeding.  I will call you with the results.   Next Appointment  Please call to make an appointment if you frank blood with bowel movements or develop lightheadedness, SOB or chest pain.   If you have any questions or concerns before then, please call the clinic at [phone].  Take Care,   Dr Phill Myron

## 2015-10-19 NOTE — Progress Notes (Signed)
   Subjective:    Patient ID: Erin Coffey, female    DOB: 11-17-1945, 70 y.o.   MRN: [id number]  Seen for Same day visit for   CC: dark stools   She reports some upset stomach and mild abdominal discomfort for the past 5-6 days.  Abdominal discomfort is localized to her umbilicus, but she denies pain.  Food seems to make the discomfort worse, and she has been taking Kaopectate, which makes the discomfort better.  She comes into clinic today because she developed dark stools over the last 3-4 days.  Denies frank blood in stool.  Denies history of GI bleed.  She has had a hysterectomy but no other abdominal pelvic surgeries.  She is on Coumadin for history of PE, and her INR has been well-controlled.  She reports history of colonoscopy had some polyps years ago.  She was recently treated with 40 mg prednisone for 5 days for shoulder pain; denies NSAID use.  Denies fevers, nausea, vomiting, diarrhea.  Endorses some mild constipation she treats with stool softeners.  Has been using iron supplements for months.   Smoking history noted  Review of Systems   See HPI for ROS. Objective:  BP 153/51 mmHg  Pulse 73  Temp(Src) 98.4 F (36.9 C) (Oral)  Wt 278 lb 6.4 oz (126.281 kg)  General: NAD Cardiac: RRR, normal heart sounds, no murmurs.  Respiratory: CTAB, normal effort Abdomen: soft, nontender, nondistended,  Bowel sounds present Extremities: no edema or cyanosis. WWP. Skin: warm and dry, no rashes noted    Assessment & Plan:   Dark stools Dark stools for 3-4 days post likely due to Kaopectate use as well as iron supplements.  However, does have a history of diverticulosis on colonoscopy in 2011 as well as currently being treated with blood thinners for history of PE.  No signs of brisk GI bleed; She doesn't not have any chest pain, dizziness is not tachycardic.  Possible abdominal discomfort due to dyspepsia, gastritis related to recent steroid use.  - Check CBC and Hemoccult cards -  Advised following up if develops chest pain, shortness of breath, frank bleeding or worsening abdominal pain - If CBC shows worsening anemia and Hemoccult cards positive, will refer back to GI for possible colonoscopy

## 2015-10-22 LAB — POC HEMOCCULT BLD/STL (HOME/3-CARD/SCREEN)
Card #2 Fecal Occult Blod, POC: NEGATIVE
Card #3 Fecal Occult Blood, POC: POSITIVE
Fecal Occult Blood, POC: NEGATIVE

## 2015-10-22 NOTE — Addendum Note (Signed)
Addended by: [place] Pink on: 10/22/2015 03:56 PM   Modules accepted: Orders

## 2015-10-31 ENCOUNTER — Other Ambulatory Visit: Payer: Self-pay | Admitting: Internal Medicine

## 2015-11-09 ENCOUNTER — Encounter: Payer: Self-pay | Admitting: Family Medicine

## 2015-11-12 ENCOUNTER — Ambulatory Visit (INDEPENDENT_AMBULATORY_CARE_PROVIDER_SITE_OTHER): Payer: Medicare Other | Admitting: Internal Medicine

## 2015-11-12 ENCOUNTER — Ambulatory Visit (INDEPENDENT_AMBULATORY_CARE_PROVIDER_SITE_OTHER): Payer: Medicare Other | Admitting: *Deleted

## 2015-11-12 ENCOUNTER — Encounter: Payer: Self-pay | Admitting: Gastroenterology

## 2015-11-12 ENCOUNTER — Encounter: Payer: Self-pay | Admitting: Internal Medicine

## 2015-11-12 VITALS — BP 142/64 | HR 57 | Temp 98.1°F | Ht 69.0 in | Wt 281.2 lb

## 2015-11-12 DIAGNOSIS — E119 Type 2 diabetes mellitus without complications: Secondary | ICD-10-CM

## 2015-11-12 DIAGNOSIS — Z0000 Encounter for general adult medical examination without abnormal findings: Secondary | ICD-10-CM

## 2015-11-12 DIAGNOSIS — Z7901 Long term (current) use of anticoagulants: Secondary | ICD-10-CM

## 2015-11-12 DIAGNOSIS — Z0189 Encounter for other specified special examinations: Secondary | ICD-10-CM | POA: Diagnosis not present

## 2015-11-12 DIAGNOSIS — E785 Hyperlipidemia, unspecified: Secondary | ICD-10-CM

## 2015-11-12 DIAGNOSIS — D649 Anemia, unspecified: Secondary | ICD-10-CM | POA: Diagnosis not present

## 2015-11-12 LAB — POCT INR: INR: 2.9

## 2015-11-12 LAB — POCT GLYCOSYLATED HEMOGLOBIN (HGB A1C): Hemoglobin A1C: 5.8

## 2015-11-12 MED ORDER — KETOCONAZOLE 2 % EX CREA: 1.0000 "application " | TOPICAL_CREAM | Freq: Two times a day (BID) | CUTANEOUS | Status: DC

## 2015-11-12 MED ORDER — TRAMADOL HCL 50 MG PO TABS: 50.0000 mg | ORAL_TABLET | Freq: Four times a day (QID) | ORAL | Status: DC | PRN

## 2015-11-12 MED ORDER — VALSARTAN 320 MG PO TABS: 320.0000 mg | ORAL_TABLET | Freq: Every day | ORAL | Status: DC

## 2015-11-12 NOTE — Patient Instructions (Addendum)
Make sure you make an appointment with your orthopedic doctor  I made a referral to a GI doctor; you should get a call about an appointment date and time.

## 2015-11-12 NOTE — Progress Notes (Signed)
Patient ID: Erin Coffey, female   DOB: Oct 12, 1945, 70 y.o.   MRN: [id number]  Date of Visit: 11/12/2015   HPI:  Patient presents today for a well woman exam.   Concerns today: none Periods: hysterectomy (cervix still present): declined Contraception: none Pelvic symptoms: none Sexual activity: noe STD Screening: none Pap smear status: Last pap 09/2011; there is only one report on file for patient  Exercise: walking three times a week for 1 hour Diet: not balanced; vegetables, stopped drinking sodas  Smoking: no Alcohol: no Drugs: none Mood: no issues Dentist: about 1 year ago  Had a recent history of dark stools. Was seen in clinic on 10/19/15. CBC was obtained and three occult stool cars. One of the three stool cards were positive. Patient has a history of diverticulosis noted on colonoscopy. Dark stools have resolved since she stopped taking her iron supplements.   ROS: See HPI  [place]:  Cancers in family:  Cousin: breast (40s)  PHYSICAL EXAM: BP 142/64 mmHg  Pulse 57  Temp(Src) 98.1 F (36.7 C) (Oral)  Ht 5\' 9"  (1.753 m)  Wt 281 lb 3.2 oz (127.551 kg)  BMI 41.51 kg/m2 Gen: NAD, pleasant, cooperative HEENT: NCAT, PERRL, no palpable thyromegaly or anterior cervical lymphadenopathy Heart: RRR, no murmurs Lungs: CTAB, NWOB Abdomen: soft, nontender to palpation Neuro: grossly nonfocal, speech normal.   ASSESSMENT/PLAN:  # Health maintenance:  -STD screening: declined  -pap smear: patient declined as she reports she has never had an abnormal pap -mammogram: last week  -lipid screening: not fasting today; ordered as a future order, patient to make lab visit fasting -DEXA: did not discuss today; will discuss at next visit  -immunizations: up to date  #History of Anemia with recent FOBT positive: - will refer to GI for further evaluation   Smiley Houseman, MD PGY 1 Family Medicine

## 2015-11-15 ENCOUNTER — Telehealth: Payer: Self-pay | Admitting: Internal Medicine

## 2015-11-15 DIAGNOSIS — E119 Type 2 diabetes mellitus without complications: Secondary | ICD-10-CM

## 2015-11-15 NOTE — Assessment & Plan Note (Signed)
Called patient to discuss recent hemoglobin A1c of 5.8. Patient was decreased to 1 tablet of Metformin 500mg  daily from BID. Patient would like to stop Metformin and see if DM can be managed with diet and exercise.  - will follow up in 3 months for repeat A1c

## 2015-11-15 NOTE — Telephone Encounter (Signed)
DM type 2 goal A1C below 7.5 Called patient to discuss recent hemoglobin A1c of 5.8. Patient was decreased to 1 tablet of Metformin 500mg  daily from BID. Patient would like to stop Metformin and see if DM can be managed with diet and exercise.  - will follow up in 3 months for repeat A1c

## 2015-12-04 ENCOUNTER — Telehealth: Payer: Self-pay

## 2015-12-04 ENCOUNTER — Encounter: Payer: Self-pay | Admitting: Gastroenterology

## 2015-12-04 ENCOUNTER — Ambulatory Visit (INDEPENDENT_AMBULATORY_CARE_PROVIDER_SITE_OTHER): Payer: Medicare Other | Admitting: Gastroenterology

## 2015-12-04 VITALS — BP 160/88 | HR 60 | Ht 69.0 in | Wt 283.4 lb

## 2015-12-04 DIAGNOSIS — K625 Hemorrhage of anus and rectum: Secondary | ICD-10-CM

## 2015-12-04 DIAGNOSIS — R195 Other fecal abnormalities: Secondary | ICD-10-CM

## 2015-12-04 NOTE — Telephone Encounter (Signed)
RE: Erin Coffey DOB: 1946/02/04 MRN: [id number]    Dear Dr Dennie Fetters,    We have scheduled the above patient for an endoscopic procedure. Our records show that she is on anticoagulation therapy.   Please advise as to how long the patient may come off her therapy of Coumadin prior to the procedure, which is scheduled for 12-17-2015.  Please fax back/ or route the completed form to Magdalene River CMA  at [phone].   Sincerely,   Elias Else

## 2015-12-04 NOTE — Patient Instructions (Addendum)
You have been scheduled for a colonoscopy. Please follow written instructions given to you at your visit today.  Please pick up your prep supplies at the pharmacy within the next 1-3 days. If you use inhalers (even only as needed), please bring them with you on the day of your procedure. Your physician has requested that you go to www.startemmi.com and enter the access code given to you at your visit today. This web site gives a general overview about your procedure. However, you should still follow specific instructions given to you by our office regarding your preparation for the procedure.  You will be contaced by our office prior to your procedure for directions on holding your Coumadin/Warfarin.  If you do not hear from our office 1 week prior to your scheduled procedure, please call [phone] to discuss.  Thank you for choosing [facility]  Dr Wilfrid Lund III

## 2015-12-04 NOTE — Progress Notes (Signed)
[facility] Consult Note:  History: Erin Coffey 12/04/2015  Referring physician: Smiley Houseman, MD  Reason for consult/chief complaint: Hematochezia and Gas   Subjective HPI:  Erin Coffey was referred by her PCP for her recent heme positive stool and some digestive symptoms. Taking prednisone for a shoulder problem, and then developed some dyspepsia with bloating and gas. She reported that her stools were dark, but she believes now it was probably due to her iron therapy. Dark stool stopped when she discontinued the iron. Her, she also saw some bright red blood on the stool on several occasions, including yesterday, and a recent fecal occult blood test was positive by her PCP. She had a colonoscopy in March 2011 with hyperplastic polyp. She reports that her digestive symptoms resolved when she stopped the prednisone, her bowel habits are regular  ROS:  Review of Systems  Constitutional: Negative for appetite change and unexpected weight change.  HENT: Negative for mouth sores and voice change. Dental problem: .lastcbc.   Eyes: Negative for pain and redness.  Respiratory: Negative for cough and shortness of breath.   Cardiovascular: Negative for chest pain and palpitations.  Genitourinary: Negative for dysuria and hematuria.  Musculoskeletal: Positive for arthralgias. Negative for myalgias.  Skin: Negative for pallor and rash.  Neurological: Negative for weakness and headaches.  Hematological: Negative for adenopathy.     Past Medical History: Past Medical History  Diagnosis Date  . Allergy   . Arthritis   . Anemia, iron deficiency   . Diabetes mellitus   . GERD (gastroesophageal reflux disease)   . Hypertension   . Chronic kidney disease   . Thyroid disease   . Pulmonary embolism (Erin Coffey) 04/28/11  . Cholelithiasis 06/25/2012  . Goiter, unspecified 10/21/2007    Qualifier: Diagnosis of  By: Erin Morn MD, Erin Coffey    . Carpal tunnel syndrome 12/07/2014   . BACK PAIN W/RADIATION, UNSPECIFIED 10/08/2006    Qualifier: Diagnosis of  By: [name]    . Trigger ring finger of left hand 11/02/2014  . Left tennis elbow 11/02/2014  . RENAL FAILURE 10/18/2009    Qualifier: History of  By: [name]  MD, Colletta [place]    . UNSPECIFIED IRON DEFICIENCY ANEMIA 10/02/2009    Qualifier: Diagnosis of  By: [name] MD, Apolonio Schneiders    . Polymyalgia rheumatica ([place]) 10/02/2009    Qualifier: Diagnosis of  By: [name] MD, Apolonio Schneiders    . Pica 08/08/2009    Qualifier: Diagnosis of  By: [name], [name]    . Personal history of colonic polyps 10/24/2009    Qualifier: Diagnosis of  By: [name] MD, Sandy Salaam   . DM type 2 goal A1C below 7.5 06/05/2008    Qualifier: Diagnosis of  By: [name]  MD, Colletta [place]    . GASTROESOPHAGEAL REFLUX, NO ESOPHAGITIS 10/08/2006    Qualifier: Diagnosis of  By: [name]    . HYPERTENSION, BENIGN ESSENTIAL 11/27/2006    Qualifier: Diagnosis of  By: Erin Morn MD, [place]    . Palpitations 10/01/2012  . HYPERLIPIDEMIA 08/08/2008    Qualifier: Diagnosis of  By: [name]  MD, Colletta [place]    . Long term (current) use of anticoagulants 05/08/2011  . Hx of pulmonary embolus 05/08/2011  . History of DVT (deep vein thrombosis) 05/05/2013  . Back pain, thoracic 12/20/2012  . ANXIETY, SITUATIONAL 04/06/2009    Qualifier: Diagnosis of  By: [name]  MD, Colletta [place]       Past Surgical History: Past Surgical History  Procedure Laterality Date  .  Abdominal hysterectomy    . Tubal ligation    . Knee arthroscopy  Bilateral  . Cardiac catheterization  04/29/11    Clean cardiac cath, no blockages.   . Carpal tunnel release Right 1996     Family History: Family History  Problem Relation Age of Onset  . Diabetes Mother   . Heart disease Mother   . Hypertension Mother   . Heart disease Father   . Hypertension Father   . Diabetes Sister   . Heart disease Sister   . Hypertension Brother   . Cancer Sister 36    lukemia  . Cirrhosis Sister     alcohol  . Kidney disease Sister   .  Diabetes Sister   . Diabetes Brother     Social History: Social History   Social History  . Marital Status: Widowed    Spouse Name: N/A  . Number of Children: 3  . Years of Education: 10   Occupational History  . Retired- [employer]  . Smoking status: Never Smoker   . Smokeless tobacco: Never Used  . Alcohol Use: No  . Drug Use: No  . Sexual Activity: Not Currently   Other Topics Concern  . None   Social History Narrative   Health Care POA:    Emergency Contact: 1. Baltazar Apo [phone] (First contact)                                      2. daughter, Marlene Lard, [phone] (second contact)   Two dgt and one son   End of Life Plan: gave pt AD information phamplet   Who lives with you: self   Any pets: none   Diet: Pt has a varied diet of protein, starch and vegetables.   Exercise: Pt reports being very active   Seatbelts: Pt reports wearing seatbelt when in vehicles.    Hobbies: cooking, crossword puzzles.          Allergies: Allergies  Allergen Reactions  . Lisinopril     REACTION: cough    Outpatient Meds: Current Outpatient Prescriptions  Medication Sig Dispense Refill  . amLODipine (NORVASC) 5 MG tablet take 1 tablet by mouth once daily 90 tablet 1  . atorvastatin (LIPITOR) 40 MG tablet Take 1 tablet (40 mg total) by mouth daily. 90 tablet 3  . baclofen (LIORESAL) 10 MG tablet Take 1 tablet (10 mg total) by mouth daily as needed for muscle spasms. 30 each 0  . Blood Glucose Monitoring Suppl (CONTOUR BLOOD GLUCOSE SYSTEM) W/DEVICE KIT Check blood sugars once daily in am.    . Blood Glucose Monitoring Suppl (RELION CONFIRM GLUCOSE MONITOR) W/DEVICE KIT by Does not apply route.    . docusate sodium (COLACE) 100 MG capsule Take 250 mg by mouth daily.     . fish oil-omega-3 fatty acids 1000 MG capsule Take 1,200 mg by mouth daily.     [name] glucose blood (BAYER CONTOUR TEST) test strip Check blood sugars two times a day     . iron polysaccharides (FERREX 150) 150 MG capsule Take 1 capsule (150 mg total) by mouth 2 (two) times daily. 60 capsule 5  . ketoconazole (NIZORAL) 2 % cream Apply 1 application topically 2 (two) times daily. To affected areas 60 g 0  . metoprolol tartrate (LOPRESSOR) 25 MG tablet Take 1 tablet (25 mg total) by  mouth 2 (two) times daily. 180 tablet 3  . omeprazole (PRILOSEC) 20 MG capsule Take 20 mg by mouth daily. As needed    . traMADol (ULTRAM) 50 MG tablet Take 1 tablet (50 mg total) by mouth every 6 (six) hours as needed. 60 tablet 0  . triamcinolone cream (KENALOG) 0.1 % Apply topically 2 (two) times daily. 30 g 2  . valsartan (DIOVAN) 320 MG tablet Take 1 tablet (320 mg total) by mouth daily. 30 tablet 11  . warfarin (COUMADIN) 5 MG tablet Take as directed 45 tablet 11   No current facility-administered medications for this visit.      ___________________________________________________________________ Objective  Exam:  BP 160/88 mmHg  Pulse 60  Ht '5\' 9"'$  (1.753 m)  Wt 283 lb 6.4 oz (128.549 kg)  BMI 41.83 kg/m2   General: this is a(n) Obese woman with good muscle mass   Eyes: sclera anicteric, no redness  ENT: oral mucosa moist without lesions, no cervical or supraclavicular lymphadenopathy, good dentition  CV: RRR without murmur, S1/S2, no JVD, no peripheral edema  Resp: clear to auscultation bilaterally, normal RR and effort noted  GI: soft, no tenderness, with active bowel sounds. No guarding or palpable organomegaly noted.  Skin; warm and dry, no rash or jaundice noted  Neuro: awake, alert and oriented x 3. Normal gross motor function and fluent speech  Labs:  Lab Results  Component Value Date   WBC 4.4 10/19/2015   HGB 10.7* 10/19/2015   HCT 32.2* 10/19/2015   MCV 84.7 10/19/2015   PLT 210 10/19/2015    Assessment: Encounter Diagnoses  Name Primary?  . Heme positive stool Yes  . Rectal bleeding       Plan:  Colonoscopy once we get an  answer from her PCP about managing her Coumadin. She needs to be off Coumadin 4 days prior, and the question is whether she needs bridging Lovenox with a history of DVT and PE in 2012  Thank you for the courtesy of this consult.  Please call me with any questions or concerns.  Nelida Meuse III

## 2015-12-06 NOTE — Telephone Encounter (Signed)
Covering inbox for Dr. Dallas Schimke while she is away. Will defer this question to her since she is more familiar with this patient's medical history. Dr. Dallas Schimke will return to clinic on May 1st. Thank you.   Smitty Cords, MD [place], PGY-1

## 2015-12-10 ENCOUNTER — Ambulatory Visit (INDEPENDENT_AMBULATORY_CARE_PROVIDER_SITE_OTHER): Payer: Medicare Other | Admitting: *Deleted

## 2015-12-10 ENCOUNTER — Telehealth: Payer: Self-pay | Admitting: Internal Medicine

## 2015-12-10 DIAGNOSIS — E785 Hyperlipidemia, unspecified: Secondary | ICD-10-CM

## 2015-12-10 DIAGNOSIS — Z7901 Long term (current) use of anticoagulants: Secondary | ICD-10-CM | POA: Diagnosis not present

## 2015-12-10 LAB — LIPID PANEL
Cholesterol: 150 mg/dL (ref 125–200)
HDL: 80 mg/dL (ref >=46)
LDL Cholesterol: 61 mg/dL (ref <130)
Total CHOL/HDL Ratio: 1.9 Ratio (ref <=5.0)
Triglycerides: 47 mg/dL (ref <150)
VLDL: 9 mg/dL (ref <30)

## 2015-12-10 LAB — POCT INR: INR: 2.4

## 2015-12-10 MED ORDER — GLUCOSE BLOOD VI STRP
ORAL_STRIP | Status: DC
Start: 2015-12-10 — End: 2020-10-29

## 2015-12-10 MED ORDER — RELION CONFIRM GLUCOSE MONITOR W/DEVICE KIT: PACK | Status: DC

## 2015-12-10 NOTE — Telephone Encounter (Signed)
Glucometer and strips sent to pharmacy. Please call patient and inform her of this. Thank you

## 2015-12-10 NOTE — Telephone Encounter (Signed)
Patient asks for a new glucose monitor and strips ASAP because the monitor is not working for a week. Please, follow up.

## 2015-12-11 NOTE — Telephone Encounter (Signed)
Notified pt of Rx at pharmacy.

## 2015-12-11 NOTE — Telephone Encounter (Signed)
Dear Mr. Erin Coffey,   According to the clinic visit note by GI physician who saw Ms. Erin Coffey to set up the colonoscopy, Erin Coffey needs to stop her coumadin 4 days prior to her procedure. The note mentioned to touch base with me regarding if the patient will need to be bridged with Lovenox during this time. After reviewing her chart and speaking with the supervising attending, she does not need to be on a Lovenox bridge.   Thank you and let me know if I can help with anything else  Smiley Houseman, MD PGY 1 Family Medicine

## 2015-12-11 NOTE — Telephone Encounter (Signed)
Please advise. Pt has the procedure coming up on 12-17-15

## 2015-12-12 ENCOUNTER — Encounter: Payer: Self-pay | Admitting: Internal Medicine

## 2015-12-12 NOTE — Telephone Encounter (Signed)
Left message for pt to return call for blood thinner directions.

## 2015-12-12 NOTE — Telephone Encounter (Signed)
Pt contacted. She states clear understanding to hold her coumadin 4 days before her procedure.

## 2015-12-17 ENCOUNTER — Ambulatory Visit (AMBULATORY_SURGERY_CENTER): Payer: Medicare Other | Admitting: Gastroenterology

## 2015-12-17 ENCOUNTER — Encounter: Payer: Self-pay | Admitting: Gastroenterology

## 2015-12-17 VITALS — BP 141/74 | HR 52 | Temp 97.3°F | Resp 14 | Ht 69.0 in | Wt 283.0 lb

## 2015-12-17 DIAGNOSIS — R195 Other fecal abnormalities: Secondary | ICD-10-CM

## 2015-12-17 LAB — GLUCOSE, CAPILLARY
Glucose-Capillary: 70 mg/dL (ref 65–99)
Glucose-Capillary: 165 mg/dL — ABNORMAL HIGH (ref 65–99)

## 2015-12-17 MED ORDER — DEXTROSE 5 % IV SOLN: INTRAVENOUS | Status: DC

## 2015-12-17 NOTE — Op Note (Signed)
[name] Procedure Date: 12/17/2015 8:23 AM MRN: [id number] Endoscopist: [place]. Loletha Coffey , MD Age: 70 Date of Birth: 05-May-1946 Gender: Female Procedure:                Colonoscopy Indications:              Heme positive stool Medicines:                Monitored Anesthesia Care Procedure:                Pre-Anesthesia Assessment:                           - Prior to the procedure, a History and Physical                            was performed, and patient medications and                            allergies were reviewed. The patient's tolerance of                            previous anesthesia was also reviewed. The risks                            and benefits of the procedure and the sedation                            options and risks were discussed with the patient.                            All questions were answered, and informed consent                            was obtained. Prior Anticoagulants: The patient has                            taken Coumadin (warfarin), last dose was 5 days                            prior to procedure. ASA Grade Assessment: II - A                            patient with mild systemic disease. After reviewing                            the risks and benefits, the patient was deemed in                            satisfactory condition to undergo the procedure.                           After obtaining informed consent, the colonoscope  was passed under direct vision. Throughout the                            procedure, the patient's blood pressure, pulse, and                            oxygen saturations were monitored continuously. The                            Model CF-HQ190L [id number]) scope was introduced                            through the anus and advanced to the the cecum,                            identified by appendiceal orifice and ileocecal            valve. The colonoscopy was performed without                            difficulty. The patient tolerated the procedure                            well. The quality of the bowel preparation was                            good. The ileocecal valve, appendiceal orifice, and                            rectum were photographed. The bowel preparation                            used was Miralax. Scope In: 8:29:02 AM Scope Out: 8:39:41 AM Scope Withdrawal Time: 0 hours 8 minutes 53 seconds  Total Procedure Duration: 0 hours 10 minutes 39 seconds  Findings:                 The perianal and digital rectal examinations were                            normal.                           Multiple medium-mouthed diverticula were found in                            the left colon.                           Internal hemorrhoids were found during                            retroflexion. The hemorrhoids were medium-sized and                            Grade I (internal hemorrhoids that do not prolapse).  The exam was otherwise without abnormality. Complications:            No immediate complications. Estimated Blood Loss:     Estimated blood loss: none. Impression:               - Diverticulosis in the left colon.                           - Internal hemorrhoids.                           - The examination was otherwise normal.                           - No specimens collected.                           - No cause for heme positive stool was discovered. Recommendation:           - Patient has a contact number available for                            emergencies. The signs and symptoms of potential                            delayed complications were discussed with the                            patient. Return to normal activities tomorrow.                            Written discharge instructions were provided to the                            patient.                            - Resume previous diet.                           - Continue present medications.                           - Resume Coumadin (warfarin) at prior dose today.                           - Repeat colonoscopy in 10 years for screening                            purposes. Erin L. Loletha Carrow, MD 12/17/2015 8:44:59 AM This report has been signed electronically.

## 2015-12-17 NOTE — Progress Notes (Signed)
To pacu vss patent wreport to rn

## 2015-12-17 NOTE — Patient Instructions (Signed)
YOU HAD AN ENDOSCOPIC PROCEDURE TODAY AT [facility]:   Refer to the procedure report that was given to you for any specific questions about what was found during the examination.  If the procedure report does not answer your questions, please call your gastroenterologist to clarify.  If you requested that your care partner not be given the details of your procedure findings, then the procedure report has been included in a sealed envelope for you to review at your convenience later.  YOU SHOULD EXPECT: Some feelings of bloating in the abdomen. Passage of more gas than usual.  Walking can help get rid of the air that was put into your GI tract during the procedure and reduce the bloating. If you had a lower endoscopy (such as a colonoscopy or flexible sigmoidoscopy) you may notice spotting of blood in your stool or on the toilet paper. If you underwent a bowel prep for your procedure, you may not have a normal bowel movement for a few days.  Please Note:  You might notice some irritation and congestion in your nose or some drainage.  This is from the oxygen used during your procedure.  There is no need for concern and it should clear up in a day or so.  SYMPTOMS TO REPORT IMMEDIATELY:   Following lower endoscopy (colonoscopy or flexible sigmoidoscopy):  Excessive amounts of blood in the stool  Significant tenderness or worsening of abdominal pains  Swelling of the abdomen that is new, acute  Fever of 100F or higher   Following upper endoscopy (EGD)  Vomiting of blood or coffee ground material  New chest pain or pain under the shoulder blades  Painful or persistently difficult swallowing  New shortness of breath  Fever of 100F or higher  Black, tarry-looking stools  For urgent or emergent issues, a gastroenterologist can be reached at any hour by calling [phone].   DIET: Your first meal following the procedure should be a small meal and then it is ok to progress to  your normal diet. Heavy or fried foods are harder to digest and may make you feel nauseous or bloated.  Likewise, meals heavy in dairy and vegetables can increase bloating.  Drink plenty of fluids but you should avoid alcoholic beverages for 24 hours.  ACTIVITY:  You should plan to take it easy for the rest of today and you should NOT DRIVE or use heavy machinery until tomorrow (because of the sedation medicines used during the test).   Resume coumadin at prior dose today. Please read all handouts given to you today.  FOLLOW UP: Our staff will call the number listed on your records the next business day following your procedure to check on you and address any questions or concerns that you may have regarding the information given to you following your procedure. If we do not reach you, we will leave a message.  However, if you are feeling well and you are not experiencing any problems, there is no need to return our call.  We will assume that you have returned to your regular daily activities without incident.  If any biopsies were taken you will be contacted by phone or by letter within the next 1-3 weeks.  Please call us at [phone] if you have not heard about the biopsies in 3 weeks.    SIGNATURES/CONFIDENTIALITY: You and/or your care partner have signed paperwork which will be entered into your electronic medical record.  These signatures attest to the  fact that that the information above on your After Visit Summary has been reviewed and is understood.  Full responsibility of the confidentiality of this discharge information lies with you and/or your care-partner.  Thank you for letting us take care of your healthcare needs today.

## 2015-12-18 ENCOUNTER — Telehealth: Payer: Self-pay | Admitting: *Deleted

## 2015-12-18 NOTE — Telephone Encounter (Signed)
  Follow up Call-  Call back number 12/17/2015  Post procedure Call Back phone  # [phone]  Permission to leave phone message Yes     Patient questions:  Do you have a fever, pain , or abdominal swelling? No. Pain Score  0 *  Have you tolerated food without any problems? Yes.    Have you been able to return to your normal activities? Yes.    Do you have any questions about your discharge instructions: Diet   No. Medications  No. Follow up visit  No.  Do you have questions or concerns about your Care? No.  Actions: * If pain score is 4 or above: No action needed, pain <4.

## 2015-12-21 ENCOUNTER — Ambulatory Visit (INDEPENDENT_AMBULATORY_CARE_PROVIDER_SITE_OTHER): Payer: Medicare Other | Admitting: *Deleted

## 2015-12-21 DIAGNOSIS — Z7901 Long term (current) use of anticoagulants: Secondary | ICD-10-CM

## 2015-12-21 LAB — POCT INR: INR: 1.4

## 2015-12-23 ENCOUNTER — Other Ambulatory Visit: Payer: Self-pay | Admitting: Family Medicine

## 2015-12-24 NOTE — Telephone Encounter (Signed)
No longer a [facility] patient

## 2016-01-01 ENCOUNTER — Ambulatory Visit: Payer: Medicare Other

## 2016-01-03 ENCOUNTER — Ambulatory Visit: Payer: Medicare Other

## 2016-01-04 ENCOUNTER — Ambulatory Visit (INDEPENDENT_AMBULATORY_CARE_PROVIDER_SITE_OTHER): Payer: Medicare Other | Admitting: *Deleted

## 2016-01-04 DIAGNOSIS — Z7901 Long term (current) use of anticoagulants: Secondary | ICD-10-CM

## 2016-01-04 LAB — POCT INR: INR: 2.4

## 2016-01-10 ENCOUNTER — Telehealth: Payer: Self-pay | Admitting: Internal Medicine

## 2016-01-10 NOTE — Telephone Encounter (Signed)
Spoke with Erin Coffey about her CBGs of 120. We decided to continue to hold Metformin as her A1c has been stable. She is due to repeat check in about 1 month. Patient understands plan.

## 2016-01-28 LAB — HM DIABETES EYE EXAM

## 2016-01-31 ENCOUNTER — Ambulatory Visit (INDEPENDENT_AMBULATORY_CARE_PROVIDER_SITE_OTHER): Payer: Medicare Other | Admitting: *Deleted

## 2016-01-31 DIAGNOSIS — E119 Type 2 diabetes mellitus without complications: Secondary | ICD-10-CM

## 2016-01-31 DIAGNOSIS — Z7901 Long term (current) use of anticoagulants: Secondary | ICD-10-CM

## 2016-01-31 LAB — POCT INR: INR: 3.7

## 2016-01-31 LAB — POCT GLYCOSYLATED HEMOGLOBIN (HGB A1C): Hemoglobin A1C: 5.7

## 2016-02-14 ENCOUNTER — Ambulatory Visit: Payer: Medicare Other | Admitting: Internal Medicine

## 2016-02-21 ENCOUNTER — Ambulatory Visit (INDEPENDENT_AMBULATORY_CARE_PROVIDER_SITE_OTHER): Payer: Medicare Other | Admitting: *Deleted

## 2016-02-21 DIAGNOSIS — Z7901 Long term (current) use of anticoagulants: Secondary | ICD-10-CM

## 2016-02-21 LAB — POCT INR: INR: 3.1

## 2016-03-20 ENCOUNTER — Ambulatory Visit (INDEPENDENT_AMBULATORY_CARE_PROVIDER_SITE_OTHER): Payer: Medicare Other | Admitting: *Deleted

## 2016-03-20 DIAGNOSIS — Z7901 Long term (current) use of anticoagulants: Secondary | ICD-10-CM

## 2016-03-20 LAB — POCT INR: INR: 2.8

## 2016-04-27 NOTE — Progress Notes (Signed)
[name] Phone: [phone]   Date of Visit: 04/29/2016   HPI:  DM2: - last A1c: 5.7 (01/31/2016) <  5.8 (11/2015) < 5.6 (06/2015) - CBGs: ~124 in the afternoon, 120 AM  - Eye Exam: 01/2016 without retinopathy  - Foot Exam: completed today, wnl  - on ARB (allergy to ACE inhibitor) - UTD on PNA vaccines  - Stopped Metformin in May 2017 to determine if DM can be diet controlled - weight is up today to 292 from 283 in April; is walking 3-4 times a week for 1hr on average for the past 5 months. Reports she started drinking sodas but is stopping. Not eating sweets anymore. Reports she is watching   HTN: - at last visit in 11/2015, decreased Metoprolol to 25mg  BID due to bradycardia (patient denied symptoms) - Current medications: Norvasc 5mg , Metoprolol 25mg  BID, Valsartan 320mg  daily - Compliance: no missed doses  - BP record: Checks BP at home: 117/53, 124/53 both yesterday. This is how her BP usually runs. Checks everyday; Has a wrist BP monitor.  - Denies CP, SOB, LE swelling, HA, no lightheadedness or dizziness, no orthopnea   - initially elevated in clinic but repeat BP wnl.   History of Normocytic Anemia: - recent CBC in 10/2015 with hgb 10.7 (10.8 2 years ago) - Colonoscopy in 12/2015 with diverticula and Grade I internal hemorrhoids - no EGD per chart review  - Last iron panel in 2015 - Iron supplement: Taking daily instead of BID due constipation  - Does have CKD Stage III, followed by [facility]  - is on coumadin; denies melena or BRBPR - denies epigastric pain; no hx of PUD  Chronic Pain: Lower back pain: Takes Tramadol 1 tab one-twice a week; also takes Tylenol PRN  Baclofen: every other week PRN for muscle spasm   Symptoms are well controlled on PRN meds. No new symptoms/worsening symptoms   ROS: See HPI.  [place]:  PMH:  HTN (with some bradycardia), DM2 (well controlled; last A1c 5.80), HLD, Obesity Warfarin Use: Note from 04/2012:  started due to PE. Pt is nervous about stopping. Does not seem to have indication for chronic coumadin. Hx of one PE; no FH of clots  Anemia with Hx of Dark Stools: FOBT positive in 10/2015. Colonoscopy in 12/2015 with Multiple medium-mouthed diverticula and internal hemorrhoids, Grade I (internal hemorrhoids that do not prolapse). OA: Followed by [facility] and [facility]) GER S/p Supracervical Abdominal Hysterectomy  PHYSICAL EXAM: BP (!) 168/67   Pulse 61   Ht 5\' 9"  (1.753 m)   Wt 292 lb (132.5 kg)   BMI 43.12 kg/m   Repeat BP 138/64 GEN: NAD CV: RRR, systolic murmur (patient reports she has had murmur for years), rubs, or gallops PULM: CTAB, normal effort SKIN: No rash or cyanosis; warm and well-perfused EXTR: No lower extremity edema or calf tenderness PSYCH: Mood and affect euthymic, normal rate and volume of speech NEURO: Awake, alert, no focal deficits grossly, normal speech  Diabetic Foot Exam - Simple   Simple Foot Form Visual Inspection No deformities, no ulcerations, no other skin breakdown bilaterally:  Yes Sensation Testing Intact to touch and monofilament testing bilaterally:  Yes Pulse Check Posterior Tibialis and Dorsalis pulse intact bilaterally:  Yes Comments     ASSESSMENT/PLAN:  Health maintenance:  - Dexa Scan (provided contact information today) - Flu Vaccine today  HYPERTENSION, BENIGN ESSENTIAL Controlled. Initial BP elevated but repeat BP at goal. Continue current regimen: Norvasc 5mg ,  Metoprolol 25mg  BID, Valsartan 320mg  daily.  - BMP today   DM type 2 goal A1C below 7.5 A1c today 5.8 from 5.7. Currently diet controlled. Patient reports of drinking more soda recently. Discussed the importance of maintaining a carb modified diet. Encouraged continued exercise and weight loss. I think A1c can now be checked q 6 months since she has been fairly stable off Metformin since April.  - follow up 6 months  OSTEOARTHRITIS, [place] Well controlled on  very small amount of Tramadol (per patient 1 tab 1-2 times a week) - refill Tramadol today   Normocytic anemia Colonoscopy in 12/2015 without concern for cancer. Is taking Iron supplement daily (not BID due to constipation despite using colace when this does occur). Last iron panel in 2015. Possibly also contributed by CKD, followed by [facility]. Hgb 10.7 10/2015 stable from 2 years ago.  - repeat Iron panel today - continue Fe supplement   FOLLOW UP: Follow up in 6 mo for HTN and DM   Smiley Houseman, MD PGY [place]

## 2016-04-29 ENCOUNTER — Ambulatory Visit (INDEPENDENT_AMBULATORY_CARE_PROVIDER_SITE_OTHER): Payer: Medicare Other | Admitting: Internal Medicine

## 2016-04-29 ENCOUNTER — Encounter: Payer: Self-pay | Admitting: Internal Medicine

## 2016-04-29 ENCOUNTER — Ambulatory Visit (INDEPENDENT_AMBULATORY_CARE_PROVIDER_SITE_OTHER): Payer: Medicare Other | Admitting: *Deleted

## 2016-04-29 VITALS — BP 138/64 | HR 61 | Ht 69.0 in | Wt 292.0 lb

## 2016-04-29 DIAGNOSIS — Z7901 Long term (current) use of anticoagulants: Secondary | ICD-10-CM

## 2016-04-29 DIAGNOSIS — E119 Type 2 diabetes mellitus without complications: Secondary | ICD-10-CM

## 2016-04-29 DIAGNOSIS — Z23 Encounter for immunization: Secondary | ICD-10-CM

## 2016-04-29 DIAGNOSIS — N189 Chronic kidney disease, unspecified: Secondary | ICD-10-CM | POA: Insufficient documentation

## 2016-04-29 DIAGNOSIS — D649 Anemia, unspecified: Secondary | ICD-10-CM | POA: Diagnosis not present

## 2016-04-29 DIAGNOSIS — I10 Essential (primary) hypertension: Secondary | ICD-10-CM | POA: Diagnosis not present

## 2016-04-29 DIAGNOSIS — E2839 Other primary ovarian failure: Secondary | ICD-10-CM

## 2016-04-29 LAB — BASIC METABOLIC PANEL WITH GFR
BUN: 13 mg/dL (ref 7–25)
CO2: 27 mmol/L (ref 20–31)
Calcium: 9 mg/dL (ref 8.6–10.4)
Chloride: 105 mmol/L (ref 98–110)
Creat: 1.22 mg/dL — ABNORMAL HIGH (ref 0.50–0.99)
GFR, Est African American: 53 mL/min — ABNORMAL LOW (ref >=60)
GFR, Est Non African American: 46 mL/min — ABNORMAL LOW (ref >=60)
Glucose, Bld: 92 mg/dL (ref 65–99)
Potassium: 4.5 mmol/L (ref 3.5–5.3)
Sodium: 139 mmol/L (ref 135–146)

## 2016-04-29 LAB — IRON AND TIBC
%SAT: 23 % (ref 11–50)
Iron: 60 ug/dL (ref 45–160)
TIBC: 256 ug/dL (ref 250–450)
UIBC: 196 ug/dL (ref 125–400)

## 2016-04-29 LAB — POCT GLYCOSYLATED HEMOGLOBIN (HGB A1C): Hemoglobin A1C: 5.8

## 2016-04-29 LAB — POCT INR: INR: 2.7

## 2016-04-29 LAB — FERRITIN: Ferritin: 42 ng/mL (ref 20–288)

## 2016-04-29 MED ORDER — AMLODIPINE BESYLATE 5 MG PO TABS: 5.0000 mg | ORAL_TABLET | Freq: Every day | ORAL | 1 refills | Status: DC

## 2016-04-29 MED ORDER — BACLOFEN 10 MG PO TABS: 10.0000 mg | ORAL_TABLET | Freq: Every day | ORAL | 0 refills | Status: DC | PRN

## 2016-04-29 MED ORDER — TRAMADOL HCL 50 MG PO TABS: 50.0000 mg | ORAL_TABLET | Freq: Two times a day (BID) | ORAL | 0 refills | Status: DC | PRN

## 2016-04-29 NOTE — Assessment & Plan Note (Signed)
A1c today 5.8 from 5.7. Currently diet controlled. Patient reports of drinking more soda recently. Discussed the importance of maintaining a carb modified diet. Encouraged continued exercise and weight loss. I think A1c can now be checked q 6 months since she has been fairly stable off Metformin since April.  - follow up 6 months

## 2016-04-29 NOTE — Assessment & Plan Note (Signed)
Colonoscopy in 12/2015 without concern for cancer. Is taking Iron supplement daily (not BID due to constipation despite using colace when this does occur). Last iron panel in 2015. Possibly also contributed by CKD, followed by [facility]. Hgb 10.7 10/2015 stable from 2 years ago.  - repeat Iron panel today - continue Fe supplement

## 2016-04-29 NOTE — Assessment & Plan Note (Signed)
Well controlled on very small amount of Tramadol (per patient 1 tab 1-2 times a week) - refill Tramadol today

## 2016-04-29 NOTE — Patient Instructions (Addendum)
We will get some blood work to check your anemia. I also ordered DEXA scan to screen for osteoporosis.   Iron-Rich Diet Iron is a mineral that helps your body to produce hemoglobin. Hemoglobin is a protein in your red blood cells that carries oxygen to your body's tissues. Eating too little iron may cause you to feel weak and tired, and it can increase your risk for infection. Eating enough iron is necessary for your body's metabolism, muscle function, and nervous system. Iron is naturally found in many foods. It can also be added to foods or fortified in foods. There are two types of dietary iron:  Heme iron. Heme iron is absorbed by the body more easily than nonheme iron. Heme iron is found in meat, poultry, and fish.  Nonheme iron. Nonheme iron is found in dietary supplements, iron-fortified grains, beans, and vegetables. You may need to follow an iron-rich diet if:  You have been diagnosed with iron deficiency or iron-deficiency anemia.  You have a condition that prevents you from absorbing dietary iron, such as:  Infection in your intestines.  Celiac disease. This involves long-lasting (chronic) inflammation of your intestines.  You do not eat enough iron.  You eat a diet that is high in foods that impair iron absorption.  You have lost a lot of blood.  You have heavy bleeding during your menstrual cycle.  You are pregnant. WHAT IS MY PLAN? Your health care provider may help you to determine how much iron you need per day based on your condition. Generally, when a person consumes sufficient amounts of iron in the diet, the following iron needs are met:  Men.  64-8 years old: 11 mg per day.  83-43 years old: 8 mg per day.  Women.   58-21 years old: 15 mg per day.  31-3 years old: 18 mg per day.  Over 23 years old: 8 mg per day.  Pregnant women: 27 mg per day.  Breastfeeding women: 9 mg per day. WHAT DO I NEED TO KNOW ABOUT AN IRON-RICH DIET?  Eat fresh fruits  and vegetables that are high in vitamin C along with foods that are high in iron. This will help increase the amount of iron that your body absorbs from food, especially with foods containing nonheme iron. Foods that are high in vitamin C include oranges, peppers, tomatoes, and mango.  Take iron supplements only as directed by your health care provider. Overdose of iron can be life-threatening. If you were prescribed iron supplements, take them with orange juice or a vitamin C supplement.  Cook foods in pots and pans that are made from iron.   Eat nonheme iron-containing foods alongside foods that are high in heme iron. This helps to improve your iron absorption.   Certain foods and drinks contain compounds that impair iron absorption. Avoid eating these foods in the same meal as iron-rich foods or with iron supplements. These include:  Coffee, black tea, and red wine.  Milk, dairy products, and foods that are high in calcium.  Beans, soybeans, and peas.  Whole grains.  When eating foods that contain both nonheme iron and compounds that impair iron absorption, follow these tips to absorb iron better.   Soak beans overnight before cooking.  Soak whole grains overnight and drain them before using.  Ferment flours before baking, such as using yeast in bread dough. WHAT FOODS CAN I EAT? Grains Iron-fortified breakfast cereal. Iron-fortified whole-wheat bread. Enriched rice. Sprouted grains. Vegetables Spinach. Potatoes with skin.  Green peas. Broccoli. Red and green bell peppers. Fermented vegetables. Fruits Prunes. Raisins. Oranges. Strawberries. Mango. Grapefruit. Meats and Other Protein Sources Beef liver. Oysters. Beef. Shrimp. [place]. Chicken. [place]. Sardines. Chickpeas. Nuts. Tofu. Beverages Tomato juice. Fresh orange juice. Prune juice. Hibiscus tea. Fortified instant breakfast shakes. Condiments Tahini. Fermented soy sauce. Sweets and Desserts Black-strap molasses.   Other Wheat germ. The items listed above may not be a complete list of recommended foods or beverages. Contact your dietitian for more options. WHAT FOODS ARE NOT RECOMMENDED? Grains Whole grains. Bran cereal. Bran flour. Oats. Vegetables Artichokes. Brussels sprouts. Kale. Fruits Blueberries. Raspberries. Strawberries. Figs. Meats and Other Protein Sources Soybeans. Products made from soy protein. Dairy Milk. Cream. Cheese. Yogurt. Cottage cheese. Beverages Coffee. Black tea. Red wine. Sweets and Desserts Cocoa. Chocolate. Ice cream. Other Basil. Oregano. Parsley. The items listed above may not be a complete list of foods and beverages to avoid. Contact your dietitian for more information.   This information is not intended to replace advice given to you by your health care provider. Make sure you discuss any questions you have with your health care provider.   Document Released: 03/11/2005 Document Revised: 08/18/2014 Document Reviewed: 02/22/2014 Elsevier Interactive Patient Education [employer].

## 2016-04-29 NOTE — Assessment & Plan Note (Signed)
Controlled. Initial BP elevated but repeat BP at goal. Continue current regimen: Norvasc 5mg , Metoprolol 25mg  BID, Valsartan 320mg  daily.  - BMP today

## 2016-05-01 ENCOUNTER — Encounter: Payer: Self-pay | Admitting: Internal Medicine

## 2016-05-01 NOTE — Progress Notes (Signed)
Sent letter to patient (routed to admin) reporting of stable labs

## 2016-05-06 ENCOUNTER — Other Ambulatory Visit: Payer: Self-pay | Admitting: *Deleted

## 2016-05-06 NOTE — Telephone Encounter (Signed)
Patient needs a refill on her warfarin.  She takes 5mg  every other day (4 days out of the week) and the other 3 days she is taking 7.5mg .  She needs a new script sent to her pharmacy to reflect this dose.  She will be out of medication starting Friday. Jazmin Hartsell,CMA

## 2016-05-07 MED ORDER — WARFARIN SODIUM 5 MG PO TABS: ORAL_TABLET | ORAL | 11 refills | Status: DC

## 2016-05-28 ENCOUNTER — Telehealth: Payer: Self-pay | Admitting: Internal Medicine

## 2016-05-28 DIAGNOSIS — M8580 Other specified disorders of bone density and structure, unspecified site: Secondary | ICD-10-CM | POA: Insufficient documentation

## 2016-05-28 MED ORDER — CALCIUM CARBONATE-VITAMIN D 500-400 MG-UNIT PO TABS: 1.0000 | ORAL_TABLET | Freq: Two times a day (BID) | ORAL | 0 refills | Status: DC

## 2016-05-28 NOTE — Telephone Encounter (Signed)
Called patient regarding her DEXA scan. She was diagnosed with osteopenia. We discussed starting Vit D and Calcium. Patient agreed. Rx sent to pharmacy.

## 2016-05-28 NOTE — Assessment & Plan Note (Signed)
Started on Vitamin D and Ca

## 2016-05-29 ENCOUNTER — Ambulatory Visit: Payer: Medicare Other

## 2016-05-30 ENCOUNTER — Ambulatory Visit (INDEPENDENT_AMBULATORY_CARE_PROVIDER_SITE_OTHER): Payer: Medicare Other | Admitting: *Deleted

## 2016-05-30 DIAGNOSIS — Z7901 Long term (current) use of anticoagulants: Secondary | ICD-10-CM

## 2016-05-30 LAB — POCT INR: INR: 2.5

## 2016-06-05 ENCOUNTER — Encounter: Payer: Self-pay | Admitting: Internal Medicine

## 2016-06-08 ENCOUNTER — Other Ambulatory Visit: Payer: Self-pay | Admitting: Internal Medicine

## 2016-06-26 ENCOUNTER — Other Ambulatory Visit: Payer: Self-pay | Admitting: Internal Medicine

## 2016-06-26 ENCOUNTER — Ambulatory Visit (INDEPENDENT_AMBULATORY_CARE_PROVIDER_SITE_OTHER): Payer: Medicare Other | Admitting: *Deleted

## 2016-06-26 DIAGNOSIS — Z7901 Long term (current) use of anticoagulants: Secondary | ICD-10-CM | POA: Diagnosis not present

## 2016-06-26 LAB — POCT INR: INR: 2.9

## 2016-06-26 NOTE — Telephone Encounter (Signed)
Patient asks refills for Ferrex and Triamcinolone ASAP. Please, follow up.

## 2016-06-27 MED ORDER — POLYSACCHARIDE IRON COMPLEX 150 MG PO CAPS: 150.0000 mg | ORAL_CAPSULE | Freq: Every day | ORAL | 5 refills | Status: DC

## 2016-06-27 MED ORDER — TRIAMCINOLONE ACETONIDE 0.1 % EX CREA: TOPICAL_CREAM | Freq: Two times a day (BID) | CUTANEOUS | 2 refills | Status: DC

## 2016-07-24 ENCOUNTER — Ambulatory Visit (INDEPENDENT_AMBULATORY_CARE_PROVIDER_SITE_OTHER): Payer: Medicare Other | Admitting: *Deleted

## 2016-07-24 DIAGNOSIS — Z7901 Long term (current) use of anticoagulants: Secondary | ICD-10-CM

## 2016-07-24 LAB — POCT INR: INR: 2.3

## 2016-07-25 ENCOUNTER — Ambulatory Visit: Payer: Medicare Other

## 2016-08-21 ENCOUNTER — Ambulatory Visit (INDEPENDENT_AMBULATORY_CARE_PROVIDER_SITE_OTHER): Payer: Medicare Other | Admitting: *Deleted

## 2016-08-21 DIAGNOSIS — Z23 Encounter for immunization: Secondary | ICD-10-CM

## 2016-08-21 DIAGNOSIS — Z5181 Encounter for therapeutic drug level monitoring: Secondary | ICD-10-CM

## 2016-08-21 DIAGNOSIS — Z86711 Personal history of pulmonary embolism: Secondary | ICD-10-CM

## 2016-08-21 DIAGNOSIS — Z7901 Long term (current) use of anticoagulants: Secondary | ICD-10-CM | POA: Diagnosis not present

## 2016-08-21 LAB — POCT INR: INR: 2.9

## 2016-09-09 ENCOUNTER — Telehealth: Payer: Self-pay | Admitting: Internal Medicine

## 2016-09-09 DIAGNOSIS — E118 Type 2 diabetes mellitus with unspecified complications: Secondary | ICD-10-CM

## 2016-09-09 NOTE — Telephone Encounter (Signed)
Ok to put future order for pt?

## 2016-09-09 NOTE — Telephone Encounter (Signed)
Pt is calling because she is here on 09/11/16 for her INR and would ike the doctor to put in orders so that she can check her A1C. She has not had this check in over a year and would like to see where she stands. jw

## 2016-09-09 NOTE — Telephone Encounter (Signed)
Future order placed. Jazmin Hartsell,CMA

## 2016-09-09 NOTE — Telephone Encounter (Signed)
Patient is aware that future order has been placed .[name],CMA

## 2016-09-09 NOTE — Telephone Encounter (Signed)
She did have one done in 04/2016 which was 5.8. You can repeat A1c.

## 2016-09-11 ENCOUNTER — Ambulatory Visit (INDEPENDENT_AMBULATORY_CARE_PROVIDER_SITE_OTHER): Payer: Medicare Other | Admitting: *Deleted

## 2016-09-11 DIAGNOSIS — E118 Type 2 diabetes mellitus with unspecified complications: Secondary | ICD-10-CM | POA: Diagnosis not present

## 2016-09-11 LAB — POCT GLYCOSYLATED HEMOGLOBIN (HGB A1C): Hemoglobin A1C: 5.7

## 2016-09-11 LAB — POCT INR: INR: 3.3

## 2016-09-12 ENCOUNTER — Telehealth: Payer: Self-pay | Admitting: *Deleted

## 2016-09-12 NOTE — Telephone Encounter (Signed)
-----   Message from Smiley Houseman, MD sent at 09/12/2016  3:31 PM EST ----- Please let Erin Coffey know that her diabetes is well controlled with her A1c at 5.7. If she is okay with this, we can recheck A1c in 6 months since it has been very stable.

## 2016-09-12 NOTE — Telephone Encounter (Signed)
Spoke with patient and she is aware of this. Erin Coffey,CMA

## 2016-09-18 ENCOUNTER — Ambulatory Visit (INDEPENDENT_AMBULATORY_CARE_PROVIDER_SITE_OTHER): Payer: Medicare Other | Admitting: *Deleted

## 2016-09-18 DIAGNOSIS — Z7901 Long term (current) use of anticoagulants: Secondary | ICD-10-CM | POA: Diagnosis not present

## 2016-09-18 LAB — POCT INR: INR: 2.7

## 2016-10-03 ENCOUNTER — Ambulatory Visit (INDEPENDENT_AMBULATORY_CARE_PROVIDER_SITE_OTHER): Payer: Medicare Other | Admitting: *Deleted

## 2016-10-03 DIAGNOSIS — Z86711 Personal history of pulmonary embolism: Secondary | ICD-10-CM | POA: Diagnosis not present

## 2016-10-03 DIAGNOSIS — Z5181 Encounter for therapeutic drug level monitoring: Secondary | ICD-10-CM

## 2016-10-03 DIAGNOSIS — Z7901 Long term (current) use of anticoagulants: Secondary | ICD-10-CM | POA: Diagnosis not present

## 2016-10-03 LAB — POCT INR: INR: 3.1

## 2016-10-21 ENCOUNTER — Encounter (HOSPITAL_COMMUNITY): Payer: Self-pay

## 2016-10-21 ENCOUNTER — Emergency Department (HOSPITAL_COMMUNITY): Payer: Medicare Other

## 2016-10-21 ENCOUNTER — Emergency Department (HOSPITAL_COMMUNITY)
Admission: EM | Admit: 2016-10-21 | Discharge: 2016-10-21 | Disposition: A | Discharge status: Home / Self Care | Payer: Medicare Other | Attending: Emergency Medicine | Admitting: Emergency Medicine

## 2016-10-21 DIAGNOSIS — Y92009 Unspecified place in unspecified non-institutional (private) residence as the place of occurrence of the external cause: Secondary | ICD-10-CM | POA: Insufficient documentation

## 2016-10-21 DIAGNOSIS — W000XXA Fall on same level due to ice and snow, initial encounter: Secondary | ICD-10-CM | POA: Insufficient documentation

## 2016-10-21 DIAGNOSIS — I129 Hypertensive chronic kidney disease with stage 1 through stage 4 chronic kidney disease, or unspecified chronic kidney disease: Secondary | ICD-10-CM | POA: Insufficient documentation

## 2016-10-21 DIAGNOSIS — Z79899 Other long term (current) drug therapy: Secondary | ICD-10-CM | POA: Diagnosis not present

## 2016-10-21 DIAGNOSIS — Y999 Unspecified external cause status: Secondary | ICD-10-CM | POA: Diagnosis not present

## 2016-10-21 DIAGNOSIS — E1122 Type 2 diabetes mellitus with diabetic chronic kidney disease: Secondary | ICD-10-CM | POA: Insufficient documentation

## 2016-10-21 DIAGNOSIS — N189 Chronic kidney disease, unspecified: Secondary | ICD-10-CM | POA: Insufficient documentation

## 2016-10-21 DIAGNOSIS — M545 Low back pain, unspecified: Secondary | ICD-10-CM

## 2016-10-21 DIAGNOSIS — Z7901 Long term (current) use of anticoagulants: Secondary | ICD-10-CM | POA: Diagnosis not present

## 2016-10-21 DIAGNOSIS — Y9301 Activity, walking, marching and hiking: Secondary | ICD-10-CM | POA: Diagnosis not present

## 2016-10-21 DIAGNOSIS — W19XXXA Unspecified fall, initial encounter: Secondary | ICD-10-CM

## 2016-10-21 MED ORDER — ACETAMINOPHEN 500 MG PO TABS
1000.0000 mg | ORAL_TABLET | Freq: Once | ORAL | Status: AC
  Administered 2016-10-21: 1000 mg via ORAL
  Filled 2016-10-21: qty 2

## 2016-10-21 MED ORDER — KETOROLAC TROMETHAMINE 30 MG/ML IJ SOLN
15.0000 mg | Freq: Once | INTRAMUSCULAR | Status: DC
  Filled 2016-10-21: qty 1

## 2016-10-21 MED ORDER — DIAZEPAM 5 MG PO TABS
5.0000 mg | ORAL_TABLET | Freq: Once | ORAL | Status: AC
  Administered 2016-10-21: 5 mg via ORAL
  Filled 2016-10-21: qty 1

## 2016-10-21 MED ORDER — OXYCODONE HCL 5 MG PO TABS
5.0000 mg | ORAL_TABLET | Freq: Once | ORAL | Status: AC
  Administered 2016-10-21: 5 mg via ORAL
  Filled 2016-10-21: qty 1

## 2016-10-21 NOTE — ED Provider Notes (Signed)
[facility] DEPT Provider Note   CSN: [id number] Arrival date & time: 10/21/16  1120     History   Chief Complaint Chief Complaint  Patient presents with  . Fall    HPI Erin Coffey is a 71 y.o. female.  71 yo F with a chief complaint of a fall. Patient was walking out to her car today and slipped on some ice. Landed on her left buttock. Complaining of pain to her left low back and her left buttock. Denies head injury denies loss consciousness denies chest pain, abdominal pain, sob.     The history is provided by the patient.  Fall  This is a new problem. The current episode started 3 to 5 hours ago. The problem occurs rarely. The problem has been resolved. Pertinent negatives include no chest pain, no headaches and no shortness of breath. Nothing aggravates the symptoms. Nothing relieves the symptoms. She has tried nothing for the symptoms. The treatment provided no relief.    Past Medical History:  Diagnosis Date  . Allergy   . Anemia, iron deficiency   . ANXIETY, SITUATIONAL 04/06/2009   Qualifier: Diagnosis of  By: [name]  MD, Colletta [place]    . Arthritis   . BACK PAIN W/RADIATION, UNSPECIFIED 10/08/2006   Qualifier: Diagnosis of  By: [name]    . Back pain, thoracic 12/20/2012  . Carpal tunnel syndrome 12/07/2014  . Cholelithiasis 06/25/2012  . Chronic kidney disease   . Diabetes mellitus   . DM type 2 goal A1C below 7.5 06/05/2008   Qualifier: Diagnosis of  By: [name]  MD, Colletta [place]    . GASTROESOPHAGEAL REFLUX, NO ESOPHAGITIS 10/08/2006   Qualifier: Diagnosis of  By: [name]    . GERD (gastroesophageal reflux disease)   . Goiter, unspecified 10/21/2007   Qualifier: Diagnosis of  By: [name] MD, [name]    . History of DVT (deep vein thrombosis) 05/05/2013  . Hx of pulmonary embolus 05/08/2011  . HYPERLIPIDEMIA 08/08/2008   Qualifier: Diagnosis of  By: [name]  MD, Colletta [place]    . Hypertension   . HYPERTENSION, BENIGN ESSENTIAL 11/27/2006   Qualifier: Diagnosis of   By: [name] MD, [place]    . Left tennis elbow 11/02/2014  . Long term (current) use of anticoagulants 05/08/2011  . Palpitations 10/01/2012  . Personal history of colonic polyps 10/24/2009   Qualifier: Diagnosis of  By: [name] MD, Sandy Salaam   . Pica 08/08/2009   Qualifier: Diagnosis of  By: [name], [name]    . Polymyalgia rheumatica ([place]) 10/02/2009   Qualifier: Diagnosis of  By: [name] MD, Apolonio Schneiders    . Pulmonary embolism ([place]) 04/28/11  . RENAL FAILURE 10/18/2009   Qualifier: History of  By: [name]  MD, Colletta [place]    . Thyroid disease   . Trigger ring finger of left hand 11/02/2014  . UNSPECIFIED IRON DEFICIENCY ANEMIA 10/02/2009   Qualifier: Diagnosis of  By: [name] MD, Apolonio Schneiders      Patient Active Problem List   Diagnosis Date Noted  . Osteopenia 05/28/2016  . Chronic kidney disease 04/29/2016  . Normocytic anemia 04/29/2016  . Dark stools 10/19/2015  . Rotator cuff dysfunction 06/25/2015  . Thigh cramp 06/25/2015  . Long term (current) use of anticoagulants 05/08/2011  . HYPERLIPIDEMIA 08/08/2008  . DM type 2 goal A1C below 7.5 06/05/2008  . HYPERTENSION, BENIGN ESSENTIAL 11/27/2006  . OBESITY, NOS 10/08/2006  . GASTROESOPHAGEAL REFLUX, NO ESOPHAGITIS 10/08/2006  . OSTEOARTHRITIS, MULTI SITES 10/08/2006    Past Surgical History:  Procedure Laterality Date  . ABDOMINAL HYSTERECTOMY    . CARDIAC CATHETERIZATION  04/29/11   Clean cardiac cath, no blockages.   . CARPAL TUNNEL RELEASE Right 1996  . KNEE ARTHROSCOPY  Bilateral  . TUBAL LIGATION      OB History    No data available       Home Medications    Prior to Admission medications   Medication Sig Start Date End Date Taking? Authorizing Provider  amLODipine (NORVASC) 5 MG tablet Take 1 tablet (5 mg total) by mouth daily. 04/29/16   Smiley Houseman, MD  atorvastatin (LIPITOR) 40 MG tablet take 1 tablet by mouth once daily 12/24/15   Smiley Houseman, MD  baclofen (LIORESAL) 10 MG tablet Take 1 tablet (10 mg total)  by mouth daily as needed for muscle spasms. 04/29/16   Smiley Houseman, MD  Blood Glucose Monitoring Suppl ([place]) w/Device KIT Please provide 1 glucometer 12/10/15   Smiley Houseman, MD  calcium-vitamin D (OSCAL-500) 500-400 MG-UNIT tablet Take 1 tablet by mouth 2 (two) times daily. 05/28/16   Smiley Houseman, MD  docusate sodium (COLACE) 100 MG capsule Take 250 mg by mouth daily.     Historical Provider, MD  fish oil-omega-3 fatty acids 1000 MG capsule Take 1,200 mg by mouth daily.     Historical Provider, MD  glucose blood test strip Use as instructed 12/10/15   Smiley Houseman, MD  iron polysaccharides (FERREX 150) 150 MG capsule Take 1 capsule (150 mg total) by mouth daily. 06/27/16   Smiley Houseman, MD  ketoconazole (NIZORAL) 2 % cream Apply 1 application topically 2 (two) times daily. To affected areas 11/12/15   Smiley Houseman, MD  metoprolol tartrate (LOPRESSOR) 25 MG tablet take 1 tablet by mouth twice a day 06/09/16   Smiley Houseman, MD  omeprazole (PRILOSEC) 20 MG capsule Take 20 mg by mouth as needed. Reported on 12/17/2015    Historical Provider, MD  traMADol (ULTRAM) 50 MG tablet Take 1 tablet (50 mg total) by mouth every 12 (twelve) hours as needed. 04/29/16   Smiley Houseman, MD  triamcinolone cream (KENALOG) 0.1 % Apply topically 2 (two) times daily. 06/27/16   Smiley Houseman, MD  valsartan (DIOVAN) 320 MG tablet Take 1 tablet (320 mg total) by mouth daily. 11/12/15   Smiley Houseman, MD  warfarin (COUMADIN) 5 MG tablet Take as directed 05/07/16   Smiley Houseman, MD    Family History Family History  Problem Relation Age of Onset  . Diabetes Mother   . Heart disease Mother   . Hypertension Mother   . Heart disease Father   . Hypertension Father   . Diabetes Sister   . Heart disease Sister   . Hypertension Brother   . Cancer Sister 93    lukemia  . Cirrhosis Sister     alcohol  . Kidney disease Sister   .  Diabetes Sister   . Diabetes Brother   . Colon cancer Neg Hx     Social History Social History  Substance Use Topics  . Smoking status: Never Smoker  . Smokeless tobacco: Never Used  . Alcohol use No     Allergies   Lisinopril   Review of Systems Review of Systems  Constitutional: Negative for chills and fever.  HENT: Negative for congestion and rhinorrhea.   Eyes: Negative for redness and visual disturbance.  Respiratory: Negative for shortness of breath and wheezing.  Cardiovascular: Negative for chest pain and palpitations.  Gastrointestinal: Negative for nausea and vomiting.  Genitourinary: Negative for dysuria and urgency.  Musculoskeletal: Positive for arthralgias, back pain and myalgias.  Skin: Negative for pallor and wound.  Neurological: Negative for dizziness and headaches.     Physical Exam Updated Vital Signs BP 166/61   Pulse 63   Temp 98 F (36.7 C) (Oral)   Resp 18   Ht _0  (1.753 m)   Wt 292 lb (132.5 kg)   SpO2 100%   BMI 43.12 kg/m   Physical Exam  Constitutional: She is oriented to person, place, and time. She appears well-developed and well-nourished. No distress.  HENT:  Head: Normocephalic and atraumatic.  Eyes: EOM are normal. Pupils are equal, round, and reactive to light.  Neck: Normal range of motion. Neck supple.  Cardiovascular: Normal rate and regular rhythm.  Exam reveals no gallop and no friction rub.   No murmur heard. Pulmonary/Chest: Effort normal. She has no wheezes. She has no rales.  Abdominal: Soft. She exhibits no distension and no mass. There is no tenderness. There is no guarding.  Musculoskeletal: She exhibits tenderness (tender palpation worst about the left SI joint. No midline tenderness). She exhibits no edema.  Pulse motor and sensation intact distally.  Neurological: She is alert and oriented to person, place, and time.  Skin: Skin is warm and dry. She is not diaphoretic.  Psychiatric: She has a normal mood  and affect. Her behavior is normal.  Nursing note and vitals reviewed.    ED Treatments / Results  Labs (all labs ordered are listed, but only abnormal results are displayed) Labs Reviewed - No data to display  EKG  EKG Interpretation None       Radiology Dg Lumbar Spine Complete  Result Date: 10/21/2016 CLINICAL DATA:  Fall, low back pain EXAM: LUMBAR SPINE - COMPLETE 4+ VIEW COMPARISON:  None. FINDINGS: Five lumbar type vertebral bodies. Normal lumbar lordosis. No evidence of fracture or dislocation. Vertebral body heights are maintained. Mild degenerative changes, most prominent at L5-S1. Visualized bony pelvis appears intact. IMPRESSION: Negative. Electronically Signed   By: Julian Hy M.D.   On: 10/21/2016 12:47   Dg Hip Unilat W Or Wo Pelvis 2-3 Views Left  Result Date: 10/21/2016 CLINICAL DATA:  Fall, left hip pain EXAM: DG HIP (WITH OR WITHOUT PELVIS) 2-3V LEFT COMPARISON:  None. FINDINGS: No fracture or dislocation is seen. Follow hip joint spaces are symmetric. Mild degenerative changes of the pubic symphysis. Mild degenerative changes at L4-5 and L5-S1. IMPRESSION: No fracture or dislocation is seen. Mild degenerative changes. Electronically Signed   By: Julian Hy M.D.   On: 10/21/2016 12:47    Procedures Procedures (including critical care time)  Medications Ordered in ED Medications  acetaminophen (TYLENOL) tablet 1,000 mg (not administered)  ketorolac (TORADOL) 30 MG/ML injection 15 mg (not administered)  oxyCODONE (Oxy IR/ROXICODONE) immediate release tablet 5 mg (not administered)  diazepam (VALIUM) tablet 5 mg (not administered)     Initial Impression / Assessment and Plan / ED Course  I have reviewed the triage vital signs and the nursing notes.  Pertinent labs & imaging results that were available during my care of the patient were reviewed by me and considered in my medical decision making (see chart for details).     71 yo F With a  chief complaints of left-sided low back pain after a fall. Plain films are negative. Patient's pain was improved with meds here.  D/c home.   1:11 PM:  I have discussed the diagnosis/risks/treatment options with the patient and family and believe the pt to be eligible for discharge home to follow-up with PCP. We also discussed returning to the ED immediately if new or worsening sx occur. We discussed the sx which are most concerning (e.g., sudden worsening pain, fever, inability to tolerate by mouth) that necessitate immediate return. Medications administered to the patient during their visit and any new prescriptions provided to the patient are listed below.  Medications given during this visit Medications  acetaminophen (TYLENOL) tablet 1,000 mg (not administered)  ketorolac (TORADOL) 30 MG/ML injection 15 mg (not administered)  oxyCODONE (Oxy IR/ROXICODONE) immediate release tablet 5 mg (not administered)  diazepam (VALIUM) tablet 5 mg (not administered)     The patient appears reasonably screen and/or stabilized for discharge and I doubt any other medical condition or other [facility] requiring further screening, evaluation, or treatment in the ED at this time prior to discharge.    Final Clinical Impressions(s) / ED Diagnoses   Final diagnoses:  Acute left-sided low back pain without sciatica  Fall in home, initial encounter    New Prescriptions New Prescriptions   No medications on file     Deno Etienne, DO 10/21/16 1311

## 2016-10-21 NOTE — Discharge Planning (Signed)
Pt up for discharge. EDCM reviewed chart for possible CM needs.  No needs identified or communicated.  

## 2016-10-21 NOTE — ED Triage Notes (Signed)
Per Pt, Pt reports walking to her car this morning and falling on the ice. Pt reports impact on the left side. Denies LOC or hitting head. Complains of Lower back pain. Denies and N/V. Pt took a muscle relaxer without relief.

## 2016-10-21 NOTE — ED Notes (Signed)
Place patient in room got patient indressed

## 2016-10-21 NOTE — ED Notes (Signed)
Patient transported to X-ray 

## 2016-10-21 NOTE — Discharge Instructions (Signed)
Take 4 over the counter ibuprofen tablets 3 times a day or 2 over-the-counter naproxen tablets twice a day for pain. Also take tylenol 1000mg (2 extra strength) four times a day.   Follow up with your family doc.  if you're concerned about the NSAIDs(ibuprofen or naproxyn) affecting your kidneys please discuss this with her family physician.

## 2016-10-24 ENCOUNTER — Emergency Department (HOSPITAL_COMMUNITY): Payer: Medicare Other

## 2016-10-24 ENCOUNTER — Emergency Department (HOSPITAL_BASED_OUTPATIENT_CLINIC_OR_DEPARTMENT_OTHER): Admit: 2016-10-24 | Discharge: 2016-10-24 | Disposition: A | Discharge status: Home / Self Care | Payer: Medicare Other

## 2016-10-24 ENCOUNTER — Encounter (HOSPITAL_COMMUNITY): Payer: Self-pay

## 2016-10-24 ENCOUNTER — Telehealth: Payer: Self-pay | Admitting: Internal Medicine

## 2016-10-24 ENCOUNTER — Emergency Department (HOSPITAL_COMMUNITY)
Admission: EM | Admit: 2016-10-24 | Discharge: 2016-10-24 | Disposition: A | Discharge status: Home / Self Care | Payer: Medicare Other | Attending: Emergency Medicine | Admitting: Emergency Medicine

## 2016-10-24 DIAGNOSIS — M79604 Pain in right leg: Secondary | ICD-10-CM | POA: Insufficient documentation

## 2016-10-24 DIAGNOSIS — M79609 Pain in unspecified limb: Secondary | ICD-10-CM

## 2016-10-24 DIAGNOSIS — I129 Hypertensive chronic kidney disease with stage 1 through stage 4 chronic kidney disease, or unspecified chronic kidney disease: Secondary | ICD-10-CM | POA: Insufficient documentation

## 2016-10-24 DIAGNOSIS — Z79899 Other long term (current) drug therapy: Secondary | ICD-10-CM | POA: Diagnosis not present

## 2016-10-24 DIAGNOSIS — Z7901 Long term (current) use of anticoagulants: Secondary | ICD-10-CM | POA: Insufficient documentation

## 2016-10-24 DIAGNOSIS — E1122 Type 2 diabetes mellitus with diabetic chronic kidney disease: Secondary | ICD-10-CM | POA: Insufficient documentation

## 2016-10-24 DIAGNOSIS — N189 Chronic kidney disease, unspecified: Secondary | ICD-10-CM | POA: Diagnosis not present

## 2016-10-24 LAB — CBC WITH DIFFERENTIAL/PLATELET
Basophils Absolute: 0 10*3/uL (ref 0.0–0.1)
Basophils Relative: 0 %
Eosinophils Absolute: 0.2 10*3/uL (ref 0.0–0.7)
Eosinophils Relative: 4 %
HCT: 34.7 % — ABNORMAL LOW (ref 36.0–46.0)
Hemoglobin: 11.2 g/dL — ABNORMAL LOW (ref 12.0–15.0)
Lymphocytes Relative: 39 %
Lymphs Abs: 1.8 10*3/uL (ref 0.7–4.0)
MCH: 27.9 pg (ref 26.0–34.0)
MCHC: 32.3 g/dL (ref 30.0–36.0)
MCV: 86.3 fL (ref 78.0–100.0)
Monocytes Absolute: 0.5 10*3/uL (ref 0.1–1.0)
Monocytes Relative: 11 %
Neutro Abs: 2.1 10*3/uL (ref 1.7–7.7)
Neutrophils Relative %: 46 %
Platelets: 177 10*3/uL (ref 150–400)
RBC: 4.02 MIL/uL (ref 3.87–5.11)
RDW: 16.7 % — ABNORMAL HIGH (ref 11.5–15.5)
WBC: 4.6 10*3/uL (ref 4.0–10.5)

## 2016-10-24 LAB — BASIC METABOLIC PANEL
Anion gap: 8 (ref 5–15)
BUN: 15 mg/dL (ref 6–20)
CO2: 26 mmol/L (ref 22–32)
Calcium: 9.2 mg/dL (ref 8.9–10.3)
Chloride: 103 mmol/L (ref 101–111)
Creatinine, Ser: 1.11 mg/dL — ABNORMAL HIGH (ref 0.44–1.00)
GFR calc Af Amer: 58 mL/min — ABNORMAL LOW (ref >60)
GFR calc non Af Amer: 50 mL/min — ABNORMAL LOW (ref >60)
Glucose, Bld: 128 mg/dL — ABNORMAL HIGH (ref 65–99)
Potassium: 4.1 mmol/L (ref 3.5–5.1)
Sodium: 137 mmol/L (ref 135–145)

## 2016-10-24 LAB — PROTIME-INR
INR: 2.89
Prothrombin Time: 30.9 seconds — ABNORMAL HIGH (ref 11.4–15.2)

## 2016-10-24 LAB — POC OCCULT BLOOD, ED: Fecal Occult Bld: NEGATIVE

## 2016-10-24 NOTE — ED Triage Notes (Signed)
Pt states seen Tuesday, fell. Pt states seen here for same, discharged. Pt states new onset R leg pain from hip to knee. Pt states hx of DVT to R leg. Pt states similar pain. Pt with no obvious redness or swelling to R leg. Pt ambulatory at triage.

## 2016-10-24 NOTE — ED Notes (Signed)
Pt to xray. Report given to [employer]

## 2016-10-24 NOTE — Telephone Encounter (Signed)
Please have patient go to ED or urgent care for further evaluation.  Derl Barrow, RN

## 2016-10-24 NOTE — ED Notes (Signed)
Attempted to reach vascular without success.

## 2016-10-24 NOTE — Telephone Encounter (Signed)
Pt fell on Tuesday. She went to ED on Tuesday and nothing was broken. She is now having pain in back of right knee. This is the same place she had blood clots in 2012. She is on coumadin. She wants to know what to do.

## 2016-10-24 NOTE — Progress Notes (Signed)
*  PRELIMINARY RESULTS* Vascular Ultrasound Right lower extremity venous duplex has been completed.  Preliminary findings: No evidence of DVT or baker's cyst   Landry Mellow, RDMS, RVT  10/24/2016, 5:40 PM

## 2016-10-24 NOTE — ED Provider Notes (Signed)
[facility]-EMERGENCY DEPT Provider Note   CSN: [id number] Arrival date & time: 10/24/16  1547     History   Chief Complaint Chief Complaint  Patient presents with  . Leg Pain    HPI Erin Coffey is a 71 y.o. female.  HPI  [name] Seat on Tuesday seen here for back and hip pain and x-rays done and were negative. Patient discharged home. Patient with persistent right knee and posterior thigh pain since that time. States is similar to previous DVTs and she's had. She is on Coumadin and has been therapeutic with the most recent INR 3.1. No synovial leg swelling. She is able to walk on it. She says that when she fell she did twist her knee in a weird direction. Has ortho appt on Tuesday. No cp, sob.  On ros does report dark stools after medications in ER on Tuesday.   Past Medical History:  Diagnosis Date  . Allergy   . Anemia, iron deficiency   . ANXIETY, SITUATIONAL 04/06/2009   Qualifier: Diagnosis of  By: Sandi Mealy  MD, Judeth Cornfield    . Arthritis   . BACK PAIN W/RADIATION, UNSPECIFIED 10/08/2006   Qualifier: Diagnosis of  By: Knox Royalty    . Back pain, thoracic 12/20/2012  . Carpal tunnel syndrome 12/07/2014  . Cholelithiasis 06/25/2012  . Chronic kidney disease   . Diabetes mellitus   . DM type 2 goal A1C below 7.5 06/05/2008   Qualifier: Diagnosis of  By: Sandi Mealy  MD, Judeth Cornfield    . GASTROESOPHAGEAL REFLUX, NO ESOPHAGITIS 10/08/2006   Qualifier: Diagnosis of  By: Knox Royalty    . GERD (gastroesophageal reflux disease)   . Goiter, unspecified 10/21/2007   Qualifier: Diagnosis of  By: Gavin Potters MD, [name]    . History of DVT (deep vein thrombosis) 05/05/2013  . Hx of pulmonary embolus 05/08/2011  . HYPERLIPIDEMIA 08/08/2008   Qualifier: Diagnosis of  By: Sandi Mealy  MD, Judeth Cornfield    . Hypertension   . HYPERTENSION, BENIGN ESSENTIAL 11/27/2006   Qualifier: Diagnosis of  By: Gavin Potters MD, [name]    . Left tennis elbow 11/02/2014  . Long term (current) use of anticoagulants 05/08/2011  . Palpitations 10/01/2012   . Personal history of colonic polyps 10/24/2009   Qualifier: Diagnosis of  By: Arlyce Dice MD, Barbette Hair   . Pica 08/08/2009   Qualifier: Diagnosis of  By: [place], [name]    . Polymyalgia rheumatica ([facility]) 10/02/2009   Qualifier: Diagnosis of  By: Hulen Luster MD, Fleet Contras    . Pulmonary embolism ([facility]) 04/28/11  . RENAL FAILURE 10/18/2009   Qualifier: History of  By: Sandi Mealy  MD, Judeth Cornfield    . Thyroid disease   . Trigger ring finger of left hand 11/02/2014  . UNSPECIFIED IRON DEFICIENCY ANEMIA 10/02/2009   Qualifier: Diagnosis of  By: Hulen Luster MD, Fleet Contras      Patient Active Problem List   Diagnosis Date Noted  . Osteopenia 05/28/2016  . Chronic kidney disease 04/29/2016  . Normocytic anemia 04/29/2016  . Dark stools 10/19/2015  . Rotator cuff dysfunction 06/25/2015  . Thigh cramp 06/25/2015  . Long term (current) use of anticoagulants 05/08/2011  . HYPERLIPIDEMIA 08/08/2008  . DM type 2 goal A1C below 7.5 06/05/2008  . HYPERTENSION, BENIGN ESSENTIAL 11/27/2006  . OBESITY, NOS 10/08/2006  . GASTROESOPHAGEAL REFLUX, NO ESOPHAGITIS 10/08/2006  . OSTEOARTHRITIS, MULTI SITES 10/08/2006    Past Surgical History:  Procedure Laterality Date  . ABDOMINAL HYSTERECTOMY    . CARDIAC CATHETERIZATION  04/29/11   Clean  cardiac cath, no blockages.   . CARPAL TUNNEL RELEASE Right 1996  . KNEE ARTHROSCOPY  Bilateral  . TUBAL LIGATION      OB History    No data available       Home Medications    Prior to Admission medications   Medication Sig Start Date End Date Taking? Authorizing Provider  amLODipine (NORVASC) 5 MG tablet Take 1 tablet (5 mg total) by mouth daily. 04/29/16  Yes Smiley Houseman, MD  atorvastatin (LIPITOR) 40 MG tablet take 1 tablet by mouth once daily Patient taking differently: 3 days a week only 12/24/15  Yes Smiley Houseman, MD  baclofen (LIORESAL) 10 MG tablet Take 1 tablet (10 mg total) by mouth daily as needed for muscle spasms. 04/29/16  Yes Smiley Houseman, MD    Blood Glucose Monitoring Suppl ([place]) w/Device KIT Please provide 1 glucometer 12/10/15  Yes Smiley Houseman, MD  calcium-vitamin D (OSCAL-500) 500-400 MG-UNIT tablet Take 1 tablet by mouth 2 (two) times daily. 05/28/16  Yes Smiley Houseman, MD  docusate sodium (COLACE) 100 MG capsule Take 250 mg by mouth daily.    Yes Historical Provider, MD  fish oil-omega-3 fatty acids 1000 MG capsule Take 1,200 mg by mouth daily.    Yes Historical Provider, MD  glucose blood test strip Use as instructed 12/10/15  Yes Smiley Houseman, MD  iron polysaccharides (FERREX 150) 150 MG capsule Take 1 capsule (150 mg total) by mouth daily. 06/27/16  Yes Smiley Houseman, MD  ketoconazole (NIZORAL) 2 % cream Apply 1 application topically 2 (two) times daily. To affected areas 11/12/15  Yes Smiley Houseman, MD  metoprolol tartrate (LOPRESSOR) 25 MG tablet take 1 tablet by mouth twice a day 06/09/16  Yes Smiley Houseman, MD  omeprazole (PRILOSEC) 20 MG capsule Take 20 mg by mouth as needed. Reported on 12/17/2015   Yes Historical Provider, MD  traMADol (ULTRAM) 50 MG tablet Take 1 tablet (50 mg total) by mouth every 12 (twelve) hours as needed. 04/29/16  Yes Smiley Houseman, MD  triamcinolone cream (KENALOG) 0.1 % Apply topically 2 (two) times daily. 06/27/16  Yes Smiley Houseman, MD  valsartan (DIOVAN) 320 MG tablet Take 1 tablet (320 mg total) by mouth daily. 11/12/15  Yes Smiley Houseman, MD  warfarin (COUMADIN) 5 MG tablet Take as directed Patient taking differently: Take 5-7.5 mg by mouth See admin instructions. Take as directed.  alternates between 7.5 mg and 5 mg daily. -Last dose 7.5 mg (10-23-16) 05/07/16  Yes Smiley Houseman, MD    Family History Family History  Problem Relation Age of Onset  . Diabetes Mother   . Heart disease Mother   . Hypertension Mother   . Heart disease Father   . Hypertension Father   . Diabetes Sister   . Heart disease Sister    . Hypertension Brother   . Cancer Sister 42    lukemia  . Cirrhosis Sister     alcohol  . Kidney disease Sister   . Diabetes Sister   . Diabetes Brother   . Colon cancer Neg Hx     Social History Social History  Substance Use Topics  . Smoking status: Never Smoker  . Smokeless tobacco: Never Used  . Alcohol use No     Allergies   Lisinopril   Review of Systems Review of Systems  All other systems reviewed and are negative.    Physical Exam  Updated Vital Signs BP (!) 149/60   Pulse 66   Temp 99.3 F (37.4 C) (Oral)   Resp 16   SpO2 100%   Physical Exam  Constitutional: She is oriented to person, place, and time. She appears well-developed and well-nourished.  HENT:  Head: Normocephalic and atraumatic.  Eyes: Conjunctivae and EOM are normal.  Neck: Normal range of motion.  Cardiovascular: Normal rate and regular rhythm.   Pulmonary/Chest: No stridor. No respiratory distress.  Abdominal: Soft. She exhibits no distension.  Musculoskeletal: Normal range of motion. She exhibits tenderness (with axial loading of right leg). She exhibits no edema or deformity.  Neurological: She is alert and oriented to person, place, and time.  Skin: Skin is warm and dry.  Nursing note and vitals reviewed.    ED Treatments / Results  Labs (all labs ordered are listed, but only abnormal results are displayed) Labs Reviewed  PROTIME-INR - Abnormal; Notable for the following:       Result Value   Prothrombin Time 30.9 (*)    All other components within normal limits  CBC WITH DIFFERENTIAL/PLATELET - Abnormal; Notable for the following:    Hemoglobin 11.2 (*)    HCT 34.7 (*)    RDW 16.7 (*)    All other components within normal limits  BASIC METABOLIC PANEL - Abnormal; Notable for the following:    Glucose, Bld 128 (*)    Creatinine, Ser 1.11 (*)    GFR calc non Af Amer 50 (*)    GFR calc Af Amer 58 (*)    All other components within normal limits  OCCULT BLOOD X 1  CARD TO LAB, STOOL  POC OCCULT BLOOD, ED    EKG  EKG Interpretation None       Radiology Dg Knee Complete 4 Views Right  Result Date: 10/24/2016 CLINICAL DATA:  Initial evaluation for acute trauma, fall. EXAM: RIGHT KNEE - COMPLETE 4+ VIEW COMPARISON:  None available. FINDINGS: No acute fracture dislocation. Moderate tricompartmental degenerative osteoarthrosis. No joint effusion. Osteopenia. No soft tissue abnormality. IMPRESSION: *No acute fracture or dislocation. *Moderate tricompartmental degenerative osteoarthrosis. Electronically Signed   By: Jeannine Boga M.D.   On: 10/24/2016 19:54    Procedures Procedures (including critical care time)  Medications Ordered in ED Medications - No data to display   Initial Impression / Assessment and Plan / ED Course  I have reviewed the triage vital signs and the nursing notes.  Pertinent labs & imaging results that were available during my care of the patient were reviewed by me and considered in my medical decision making (see chart for details).    Will eval for dvt vs fracture.  Will also do a hemoccult.   Workup negative. Plan for dc w/ pcp/ortho follow up.   Final Clinical Impressions(s) / ED Diagnoses   Final diagnoses:  Right leg pain      Merrily Pew, MD 10/24/16 2043

## 2016-10-25 ENCOUNTER — Other Ambulatory Visit: Payer: Self-pay | Admitting: Internal Medicine

## 2016-10-28 NOTE — Telephone Encounter (Signed)
Please remind patient that she was last seen in clinic on 04/2016. We decided on 6 month follow up. Per chart review, it does not look like she has a follow up appt scheduled. Thank you

## 2016-10-28 NOTE — Telephone Encounter (Signed)
Apt made for 3/23 with PCP. Pt aware and voiced understanding.

## 2016-10-31 ENCOUNTER — Other Ambulatory Visit: Payer: Self-pay | Admitting: Internal Medicine

## 2016-10-31 ENCOUNTER — Ambulatory Visit (INDEPENDENT_AMBULATORY_CARE_PROVIDER_SITE_OTHER): Payer: Medicare Other | Admitting: *Deleted

## 2016-10-31 ENCOUNTER — Ambulatory Visit (INDEPENDENT_AMBULATORY_CARE_PROVIDER_SITE_OTHER): Payer: Medicare Other | Admitting: Internal Medicine

## 2016-10-31 ENCOUNTER — Encounter: Payer: Self-pay | Admitting: Internal Medicine

## 2016-10-31 VITALS — BP 132/80 | HR 55 | Temp 98.1°F | Wt 290.0 lb

## 2016-10-31 DIAGNOSIS — M8580 Other specified disorders of bone density and structure, unspecified site: Secondary | ICD-10-CM

## 2016-10-31 DIAGNOSIS — M545 Low back pain, unspecified: Secondary | ICD-10-CM

## 2016-10-31 DIAGNOSIS — M859 Disorder of bone density and structure, unspecified: Secondary | ICD-10-CM | POA: Diagnosis not present

## 2016-10-31 LAB — POCT INR: INR: 3.3

## 2016-10-31 NOTE — Patient Instructions (Addendum)
Please follow up in 5 months.  Please try heat packs and continue stretches  You can also try to take Baclofen

## 2016-10-31 NOTE — Progress Notes (Signed)
[name]  [facility] Phone: [phone]   Date of Visit: 10/31/2016   HPI:  Low back pain:  - reports that she fell on black ice on 3/13 and has had low back pain since then. She also has chronic back pain but the fall exacerbated her symptoms. Was seen in the ED and had x-ray of lumbar spine which was unremarkable other than for some mild degenerative changes on L5-S1. Left hip x-ray showed no acute fracture but some mild degenerative changes.  - reports that she was seen by her orthopedic doctor on Monday and she received 2 steroid shots in her her back which have helped. Symptoms are slowly improving. No weakness, numbness or tingling in her lower extremities. Reports she is also doing low back stretching exercises. Takes Tylenol at times. Takes baclofen rarely.  - she also reports that she has been diagnosed with rheumatoid arthritis about 10 years ago. She has not been on any RA specific medications and has never seen a rheumatologist. She declines a referral today.   Osteopenia:  - is taking Vit D and Calcium  - has never had Vit D levels checked  HLD:  - Taking Atorvastatin three times a week because she is afraid that it may harm her liver.  - Last lipid was 12/2015 and was very controlled with the above regimen - no muscle aches other than what is noted above and no RUQ pain.   ROS: See HPI.  [place]:  PMH:  HTN , DM2 (well controlled), HLD, CKD III, Obesity Warfarin Use: Note from 04/2012: started due to PE. Pt is nervous about stopping. Does not seem to have indication for chronic coumadin. Hx of one PE; no FH of clots  Anemia with Hx of Dark Stools: FOBT positive in 10/2015. Colonoscopy in 12/2015 with Multiple medium-mouthed diverticula and internal hemorrhoids, Grade I (internal hemorrhoids that do not prolapse). OA: Followed by Manson Passey Percell Miller and Para March) Osteopenia GER S/p Supracervical Abdominal Hysterectomy  PHYSICAL EXAM: BP 132/80   Pulse (!) 55    Temp 98.1 F (36.7 C) (Oral)   Wt 290 lb (131.5 kg)   SpO2 99%   BMI 42.83 kg/m  GEN: NAD, pleasant  HEENT: Atraumatic, normocephalic, neck supple, EOMI, sclera clear  CV: RRR, no murmurs, rubs, or gallops PULM: CTAB, normal effort ABD: Soft, nontender, nondistended, NABS, no organomegaly MSK: normal gait. no midline tenderness to palpation of spine. Tenderness to palpation of the right upper gluteal muscles. Lower extremity strength 5/5 bilaterally. Normal sensation to light touch. Negative straight leg raise.  SKIN: No rash or cyanosis; warm and well-perfused EXTR: No lower extremity edema or calf tenderness PSYCH: Mood and affect euthymic, normal rate and volume of speech NEURO: Awake, alert, no focal deficits grossly, normal speech  Diabetic Foot Exam - Simple   Simple Foot Form Visual Inspection No deformities, no ulcerations, no other skin breakdown bilaterally:  Yes Sensation Testing Intact to touch and monofilament testing bilaterally:  Yes Pulse Check Posterior Tibialis and Dorsalis pulse intact bilaterally:  Yes Comments     ASSESSMENT/PLAN: Low back pain without sciatica, unspecified back pain laterality, unspecified chronicity Acute exacerbation of chronic pain. s/p steroid injections at ortho. Continue Tylenol PRN.  Encouraged to take Baclofen more regularly during this acute exacerbation. Not good candidate for NSAIDs. Continue Stretching exercises.   Osteopenia, unspecified location Currently on daily Vit D and Ca but level has never been checked. Will check to see if she has a deficiency in  which case she will need a higher dose of Vit D rather than maintenance dose.  - VITAMIN D 25 Hydroxy (Vit-D Deficiency, Fractures)  HLD:  Continue Atorvastatin 3 days a week per patient preference.  Repeat Lipid due in May   Follow up in ~5 months.   Smiley Houseman, MD PGY [place]

## 2016-11-01 LAB — VITAMIN D 25 HYDROXY (VIT D DEFICIENCY, FRACTURES): Vit D, 25-Hydroxy: 33.9 ng/mL (ref 30.0–100.0)

## 2016-11-05 ENCOUNTER — Telehealth: Payer: Self-pay | Admitting: Internal Medicine

## 2016-11-05 NOTE — Telephone Encounter (Signed)
-----   Message from Alveda Reasons, MD sent at 11/01/2016  8:32 AM EDT -----   ----- Message ----- From: Lavone Neri Lab Results In Sent: 11/01/2016   8:13 AM To: Alveda Reasons, MD

## 2016-11-05 NOTE — Telephone Encounter (Signed)
Called patient to report of normal Vit D level. Continue current Ca-Vit D

## 2016-11-11 ENCOUNTER — Other Ambulatory Visit: Payer: Self-pay | Admitting: *Deleted

## 2016-11-11 MED ORDER — BACLOFEN 10 MG PO TABS: 10.0000 mg | ORAL_TABLET | Freq: Every day | ORAL | 0 refills | Status: DC | PRN

## 2016-11-24 ENCOUNTER — Encounter: Payer: Self-pay | Admitting: Internal Medicine

## 2016-11-28 ENCOUNTER — Ambulatory Visit: Payer: Medicare Other

## 2016-12-01 ENCOUNTER — Other Ambulatory Visit: Payer: Self-pay | Admitting: Family Medicine

## 2016-12-01 ENCOUNTER — Ambulatory Visit (INDEPENDENT_AMBULATORY_CARE_PROVIDER_SITE_OTHER): Payer: Medicare Other | Admitting: *Deleted

## 2016-12-01 DIAGNOSIS — Z7901 Long term (current) use of anticoagulants: Secondary | ICD-10-CM | POA: Diagnosis not present

## 2016-12-01 LAB — POCT INR: INR: >5

## 2016-12-01 LAB — PROTIME-INR
INR: 4.2 — ABNORMAL HIGH (ref 0.8–1.2)
Prothrombin Time: 42.8 s — ABNORMAL HIGH (ref 9.1–12.0)

## 2016-12-04 LAB — PROTIME-INR

## 2016-12-08 ENCOUNTER — Ambulatory Visit (INDEPENDENT_AMBULATORY_CARE_PROVIDER_SITE_OTHER): Payer: Medicare Other | Admitting: *Deleted

## 2016-12-08 DIAGNOSIS — Z7901 Long term (current) use of anticoagulants: Secondary | ICD-10-CM | POA: Diagnosis not present

## 2016-12-08 LAB — POCT INR: INR: 2.5

## 2016-12-25 ENCOUNTER — Ambulatory Visit (INDEPENDENT_AMBULATORY_CARE_PROVIDER_SITE_OTHER): Payer: Medicare Other | Admitting: *Deleted

## 2016-12-25 DIAGNOSIS — Z7901 Long term (current) use of anticoagulants: Secondary | ICD-10-CM | POA: Diagnosis not present

## 2016-12-25 LAB — POCT INR: INR: 4.8

## 2017-01-01 ENCOUNTER — Ambulatory Visit: Payer: Medicare Other

## 2017-01-01 NOTE — Progress Notes (Signed)
   [facility] Phone: [phone]   Date of Visit: 01/02/2017   HPI:  Left Sided Back Pain:  - has chronic pain but has had a flare of symptoms since 10/2016 after she slipped and fell on ice. She was seen in the ED and x-ray lumbar spine, hip and right knee. Showed no acute abnormality. Showed mild degenerative changes in L5-S1.  - usually her pain is controlled with Tylenol daily and rarely needs Tramadol, however she has been needing her Tramamdol 50 BID  - reports of intermittent weakness. No falls.  - no numbness and no saddle anesthesia.  - no urinary retention or incontinence or bowel incontinence.   ROS: See HPI.  [place]:  HTN , DM2, HLD, CKD III, Obesity Warfarin Use: Note from 04/2012: started due to PE. Pt is nervous about stopping. Does not seem to have indication for chronic coumadin. Hx of one PE;no FH of clots Anemia with Hx of Dark Stools: Colonoscopy in 12/2015 with Multiple medium-mouthed diverticula and internal hemorrhoids, Grade I (internal hemorrhoids that do not prolapse). Internal Hemorrhoids Diverticulosis OA: Followed by Manson Passey Percell Miller and Para March) Osteopenia GER S/p Supracervical Abdominal Hysterectomy  PHYSICAL EXAM: BP (!) 138/58   Pulse 72   Temp 99 F (37.2 C) (Oral)   Ht 5\' 9"  (1.753 m)   Wt 288 lb (130.6 kg)   SpO2 99%   BMI 42.53 kg/m  GEN: NAD ABD: Soft, nontender, nondistended, NABS, no organomegaly MSK: paraspinal tenderness of the right lumbar area. No spinal tenderness. Straight leg raise is negative bilaterally. Gait is normal. Patellar reflex normal bilaterally  SKIN: No rash or cyanosis; warm and well-perfused EXTR: No lower extremity edema or calf tenderness PSYCH: Mood and affect euthymic, normal rate and volume of speech NEURO: Awake, alert, no focal deficits grossly, normal speech   ASSESSMENT/PLAN:  Health maintenance:  -lipid panel future   Back pain No significant abnormalities on x-ray. No red  flags noted. Will try pain management with increasing Tramadol to 100mg  and continue to use Tylenol. Referral to physical therapy. Red flags discussed. Follow up in 4 weeks if symptoms do not improve or sooner if symptoms worsen.    Smiley Houseman, MD PGY [place]

## 2017-01-02 ENCOUNTER — Ambulatory Visit (INDEPENDENT_AMBULATORY_CARE_PROVIDER_SITE_OTHER): Payer: Medicare Other | Admitting: Internal Medicine

## 2017-01-02 ENCOUNTER — Ambulatory Visit (INDEPENDENT_AMBULATORY_CARE_PROVIDER_SITE_OTHER): Payer: Medicare Other | Admitting: *Deleted

## 2017-01-02 ENCOUNTER — Encounter: Payer: Self-pay | Admitting: Internal Medicine

## 2017-01-02 VITALS — BP 138/58 | HR 72 | Temp 99.0°F | Ht 69.0 in | Wt 288.0 lb

## 2017-01-02 DIAGNOSIS — G8929 Other chronic pain: Secondary | ICD-10-CM | POA: Diagnosis not present

## 2017-01-02 DIAGNOSIS — E7849 Other hyperlipidemia: Secondary | ICD-10-CM

## 2017-01-02 DIAGNOSIS — Z7901 Long term (current) use of anticoagulants: Secondary | ICD-10-CM | POA: Diagnosis not present

## 2017-01-02 DIAGNOSIS — M545 Low back pain, unspecified: Secondary | ICD-10-CM

## 2017-01-02 DIAGNOSIS — E784 Other hyperlipidemia: Secondary | ICD-10-CM | POA: Diagnosis not present

## 2017-01-02 LAB — POCT INR: INR: 1.8

## 2017-01-02 MED ORDER — TRAMADOL HCL 50 MG PO TABS: 100.0000 mg | ORAL_TABLET | Freq: Two times a day (BID) | ORAL | 0 refills | Status: DC | PRN

## 2017-01-02 NOTE — Patient Instructions (Signed)
We referred you to physical therapy  We increased your Tramadol since you have a flare of your back pain. I would continue to try Tylenol as well  Follow up in 4 weeks if symptoms do not improve

## 2017-01-05 DIAGNOSIS — M549 Dorsalgia, unspecified: Secondary | ICD-10-CM | POA: Insufficient documentation

## 2017-01-05 NOTE — Assessment & Plan Note (Signed)
No significant abnormalities on x-ray. No red flags noted. Will try pain management with increasing Tramadol to 100mg  and continue to use Tylenol. Referral to physical therapy. Red flags discussed. Follow up in 4 weeks if symptoms do not improve or sooner if symptoms worsen.

## 2017-01-07 ENCOUNTER — Other Ambulatory Visit: Payer: Self-pay | Admitting: Neurosurgery

## 2017-01-07 DIAGNOSIS — M546 Pain in thoracic spine: Secondary | ICD-10-CM

## 2017-01-08 ENCOUNTER — Ambulatory Visit (INDEPENDENT_AMBULATORY_CARE_PROVIDER_SITE_OTHER): Payer: Medicare Other | Admitting: *Deleted

## 2017-01-08 DIAGNOSIS — E784 Other hyperlipidemia: Secondary | ICD-10-CM | POA: Diagnosis not present

## 2017-01-08 DIAGNOSIS — Z7901 Long term (current) use of anticoagulants: Secondary | ICD-10-CM

## 2017-01-08 DIAGNOSIS — E7849 Other hyperlipidemia: Secondary | ICD-10-CM

## 2017-01-08 LAB — POCT INR: INR: 3.4

## 2017-01-09 LAB — LIPID PANEL
Chol/HDL Ratio: 2.1 ratio (ref 0.0–4.4)
Cholesterol, Total: 162 mg/dL (ref 100–199)
HDL: 76 mg/dL (ref >39)
LDL Calculated: 76 mg/dL (ref 0–99)
Triglycerides: 51 mg/dL (ref 0–149)
VLDL Cholesterol Cal: 10 mg/dL (ref 5–40)

## 2017-01-12 ENCOUNTER — Ambulatory Visit: Payer: Medicare Other | Admitting: Physical Therapy

## 2017-01-12 LAB — HM DIABETES EYE EXAM

## 2017-01-20 ENCOUNTER — Ambulatory Visit
Admission: RE | Admit: 2017-01-20 | Discharge: 2017-01-20 | Disposition: A | Discharge status: Home / Self Care | Payer: Medicare Other | Source: Ambulatory Visit | Attending: Neurosurgery | Admitting: Neurosurgery

## 2017-01-20 ENCOUNTER — Encounter: Payer: Self-pay | Admitting: Internal Medicine

## 2017-01-20 DIAGNOSIS — M546 Pain in thoracic spine: Secondary | ICD-10-CM

## 2017-01-20 DIAGNOSIS — H524 Presbyopia: Secondary | ICD-10-CM | POA: Insufficient documentation

## 2017-01-20 DIAGNOSIS — H5203 Hypermetropia, bilateral: Secondary | ICD-10-CM | POA: Insufficient documentation

## 2017-01-20 DIAGNOSIS — H35031 Hypertensive retinopathy, right eye: Secondary | ICD-10-CM | POA: Insufficient documentation

## 2017-01-20 MED ORDER — GADOBENATE DIMEGLUMINE 529 MG/ML IV SOLN
20.0000 mL | Freq: Once | INTRAVENOUS | Status: AC | PRN
  Administered 2017-01-20: 20 mL via INTRAVENOUS

## 2017-01-22 ENCOUNTER — Ambulatory Visit (INDEPENDENT_AMBULATORY_CARE_PROVIDER_SITE_OTHER): Payer: Medicare Other | Admitting: *Deleted

## 2017-01-22 DIAGNOSIS — Z7901 Long term (current) use of anticoagulants: Secondary | ICD-10-CM

## 2017-01-22 LAB — POCT INR: INR: 3.9

## 2017-02-05 ENCOUNTER — Ambulatory Visit (INDEPENDENT_AMBULATORY_CARE_PROVIDER_SITE_OTHER): Payer: Medicare Other | Admitting: *Deleted

## 2017-02-05 DIAGNOSIS — Z7901 Long term (current) use of anticoagulants: Secondary | ICD-10-CM | POA: Diagnosis not present

## 2017-02-05 DIAGNOSIS — E119 Type 2 diabetes mellitus without complications: Secondary | ICD-10-CM

## 2017-02-05 LAB — POCT GLYCOSYLATED HEMOGLOBIN (HGB A1C): Hemoglobin A1C: 5.8

## 2017-02-05 LAB — POCT INR: INR: 3.5

## 2017-02-12 ENCOUNTER — Encounter: Payer: Self-pay | Admitting: Internal Medicine

## 2017-02-12 ENCOUNTER — Telehealth: Payer: Self-pay | Admitting: Pharmacist

## 2017-02-12 NOTE — Telephone Encounter (Signed)
02/12/17 Patient is potential candidate for transition from warfarin to [place].  Per PCP note patient has been on warfarin since 04/2012 due to PE and patient has no indication for chronic coumadin but patient hesitant to stop.  Called patient today to discuss stopping/switching to [place].  Patient is nervous to stop warfarin as she does not want another blood clot.  Patient prefers to stay on warfarin instead of DOAC for now.  Instructed patient to discuss with PCP at next visit.  Bennye Alm, PharmD, [place] PGY2 Pharmacy Resident [phone]

## 2017-02-19 ENCOUNTER — Ambulatory Visit (INDEPENDENT_AMBULATORY_CARE_PROVIDER_SITE_OTHER): Payer: Medicare Other | Admitting: *Deleted

## 2017-02-19 DIAGNOSIS — Z7901 Long term (current) use of anticoagulants: Secondary | ICD-10-CM | POA: Diagnosis not present

## 2017-02-19 LAB — POCT INR: INR: 2.7

## 2017-02-23 ENCOUNTER — Telehealth: Payer: Self-pay | Admitting: Internal Medicine

## 2017-02-23 MED ORDER — LOSARTAN POTASSIUM 100 MG PO TABS: 100.0000 mg | ORAL_TABLET | Freq: Every day | ORAL | 0 refills | Status: DC

## 2017-02-23 NOTE — Telephone Encounter (Signed)
Rx for losartan sent (similar dose to valsartan). Discontinue Valsartan. Please inform patient

## 2017-02-23 NOTE — Telephone Encounter (Signed)
Pt states her BP medication has been recalled, pt is on  Valsartan. Pt is currently out of town, if PCP wants to call in something different please use [facility] on [address] in [place]. ep

## 2017-02-24 NOTE — Telephone Encounter (Signed)
Pt contacted and informed of rx change, pt was appreciative and had no further concerns.

## 2017-03-12 ENCOUNTER — Ambulatory Visit (INDEPENDENT_AMBULATORY_CARE_PROVIDER_SITE_OTHER): Payer: Medicare Other | Admitting: *Deleted

## 2017-03-12 DIAGNOSIS — Z7901 Long term (current) use of anticoagulants: Secondary | ICD-10-CM

## 2017-03-12 LAB — POCT INR: INR: 1.7

## 2017-03-19 ENCOUNTER — Ambulatory Visit: Payer: Medicare Other

## 2017-03-24 ENCOUNTER — Ambulatory Visit (INDEPENDENT_AMBULATORY_CARE_PROVIDER_SITE_OTHER): Payer: Medicare Other | Admitting: *Deleted

## 2017-03-24 ENCOUNTER — Ambulatory Visit (INDEPENDENT_AMBULATORY_CARE_PROVIDER_SITE_OTHER): Payer: Medicare Other | Admitting: Internal Medicine

## 2017-03-24 ENCOUNTER — Encounter: Payer: Self-pay | Admitting: Internal Medicine

## 2017-03-24 VITALS — BP 142/75 | HR 60 | Temp 98.0°F | Wt 285.0 lb

## 2017-03-24 DIAGNOSIS — K5730 Diverticulosis of large intestine without perforation or abscess without bleeding: Secondary | ICD-10-CM | POA: Insufficient documentation

## 2017-03-24 DIAGNOSIS — R1011 Right upper quadrant pain: Secondary | ICD-10-CM | POA: Diagnosis not present

## 2017-03-24 DIAGNOSIS — K648 Other hemorrhoids: Secondary | ICD-10-CM | POA: Insufficient documentation

## 2017-03-24 DIAGNOSIS — Z7901 Long term (current) use of anticoagulants: Secondary | ICD-10-CM

## 2017-03-24 LAB — POCT INR: INR: 3.7

## 2017-03-24 NOTE — Progress Notes (Signed)
   [facility] Phone: [phone]   Date of Visit: 03/24/2017   HPI:  Abdominal/Back Pain:  - Patient reports of right flying pain with radiation to the abdomen for the past month - Symptoms are "steady nagging pain with intermittent sharp pain" - Symptoms have been worsening over the past month -Symptoms worse after eating. She reports she tries to stay away from fatty foods - Reports of a history of constipation but uses MiraLAX. Her last bowel movement was yesterday and was soft. No blood in her stool - No nausea, vomiting, fevers. Reports of some gas - She has a known history of gallstones - No rashes around the area where she has pain  ROS: See HPI.  [place]:  PMH: HTN Hypertensive Retinopathy of R eye GER DM2 OA Osteopenia CKD HLD Obesity  Long term use of anticoagulants   PHYSICAL EXAM: BP (!) 142/75   Pulse 60   Temp 98 F (36.7 C) (Oral)   Wt 285 lb (129.3 kg)   SpO2 99%   BMI 42.09 kg/m  GEN: NAD, nontoxic CV: RRR, no murmurs, rubs, or gallops PULM: CTAB, normal effort ABD: Soft, tenderness palpation on the right upper quadrant and some on the epigastric region as well without any guarding or rebound. Good bowel sounds. MSK: Currently no tenderness around the flank. No CVA tenderness SKIN: No rash or cyanosis; warm and well-perfused PSYCH: Mood and affect euthymic, normal rate and volume of speech NEURO: Awake, alert, no focal deficits grossly, normal speech   ASSESSMENT/PLAN: RUQ pain Symptoms are most consistent with symptomatic cholelithiasis. She has a known history of gallstones. I do not think she has cholecystitis. Differentials include hepatitis, pancreatitis. Declined any pain medications today. Discussed eating plain foods and avoiding receiver fatty foods. Will get blood work including CBC, CMP, lipase. Ordered ultrasound of the right upper quadrant abdomen. Discussed ED precautions. - US Abdomen Limited RUQ; Future -  Lipase - Comprehensive metabolic panel - CBC  Smiley Houseman, MD PGY [place]

## 2017-03-24 NOTE — Patient Instructions (Addendum)
I think your abdominal pain is related to your gall stones We will get labs today. Will call with the results I also ordered an ultrasound to look at your gal bladder   You can make a nurse visit to make sure your wrist blood pressure cuff is accurate with our blood pressure monitors.

## 2017-03-25 ENCOUNTER — Other Ambulatory Visit: Payer: Self-pay | Admitting: *Deleted

## 2017-03-25 LAB — COMPREHENSIVE METABOLIC PANEL
ALT: 16 IU/L (ref 0–32)
AST: 24 IU/L (ref 0–40)
Albumin/Globulin Ratio: 1.1 — ABNORMAL LOW (ref 1.2–2.2)
Albumin: 4.2 g/dL (ref 3.6–4.8)
Alkaline Phosphatase: 80 IU/L (ref 39–117)
BUN/Creatinine Ratio: 11 — ABNORMAL LOW (ref 12–28)
BUN: 14 mg/dL (ref 8–27)
Bilirubin Total: 0.5 mg/dL (ref 0.0–1.2)
CO2: 22 mmol/L (ref 20–29)
Calcium: 9.4 mg/dL (ref 8.7–10.3)
Chloride: 105 mmol/L (ref 96–106)
Creatinine, Ser: 1.31 mg/dL — ABNORMAL HIGH (ref 0.57–1.00)
GFR calc Af Amer: 48 mL/min/{1.73_m2} — ABNORMAL LOW (ref >59)
GFR calc non Af Amer: 42 mL/min/{1.73_m2} — ABNORMAL LOW (ref >59)
Globulin, Total: 4 g/dL (ref 1.5–4.5)
Glucose: 89 mg/dL (ref 65–99)
Potassium: 4.5 mmol/L (ref 3.5–5.2)
Sodium: 140 mmol/L (ref 134–144)
Total Protein: 8.2 g/dL (ref 6.0–8.5)

## 2017-03-25 LAB — CBC
Hematocrit: 34.6 % (ref 34.0–46.6)
Hemoglobin: 11.1 g/dL (ref 11.1–15.9)
MCH: 28.4 pg (ref 26.6–33.0)
MCHC: 32.1 g/dL (ref 31.5–35.7)
MCV: 89 fL (ref 79–97)
Platelets: 219 10*3/uL (ref 150–379)
RBC: 3.91 x10E6/uL (ref 3.77–5.28)
RDW: 16.4 % — ABNORMAL HIGH (ref 12.3–15.4)
WBC: 4.9 10*3/uL (ref 3.4–10.8)

## 2017-03-25 LAB — LIPASE: Lipase: 39 U/L (ref 14–72)

## 2017-03-25 MED ORDER — ATORVASTATIN CALCIUM 40 MG PO TABS: 40.0000 mg | ORAL_TABLET | Freq: Every day | ORAL | 3 refills | Status: DC

## 2017-03-30 ENCOUNTER — Ambulatory Visit (HOSPITAL_COMMUNITY)
Admission: RE | Admit: 2017-03-30 | Discharge: 2017-03-30 | Disposition: A | Discharge status: Home / Self Care | Payer: Medicare Other | Source: Ambulatory Visit | Attending: Family Medicine | Admitting: Family Medicine

## 2017-03-30 DIAGNOSIS — R1011 Right upper quadrant pain: Secondary | ICD-10-CM

## 2017-03-30 DIAGNOSIS — K8020 Calculus of gallbladder without cholecystitis without obstruction: Secondary | ICD-10-CM | POA: Diagnosis not present

## 2017-03-31 ENCOUNTER — Telehealth: Payer: Self-pay | Admitting: Internal Medicine

## 2017-03-31 NOTE — Telephone Encounter (Signed)
Called patient to discuss labs and [name]. Symptoms most consistent with symptomatic cholelithiasis. We discussed options of management including elective surgery. Patient would like to try conservative management with diet modifications. We also talked about possibly referring to GI for medication management of this. She would like to try diet changes first.   I printed information for her regarding this and placed it at front desk for pick up. Patient informed.

## 2017-04-07 ENCOUNTER — Ambulatory Visit: Payer: Medicare Other

## 2017-04-09 ENCOUNTER — Ambulatory Visit (INDEPENDENT_AMBULATORY_CARE_PROVIDER_SITE_OTHER): Payer: Medicare Other | Admitting: *Deleted

## 2017-04-09 DIAGNOSIS — Z7901 Long term (current) use of anticoagulants: Secondary | ICD-10-CM

## 2017-04-09 DIAGNOSIS — Z23 Encounter for immunization: Secondary | ICD-10-CM | POA: Diagnosis not present

## 2017-04-09 LAB — POCT INR: INR: 3.8

## 2017-04-21 ENCOUNTER — Other Ambulatory Visit: Payer: Self-pay | Admitting: Internal Medicine

## 2017-04-23 ENCOUNTER — Ambulatory Visit (INDEPENDENT_AMBULATORY_CARE_PROVIDER_SITE_OTHER): Payer: Medicare Other | Admitting: *Deleted

## 2017-04-23 DIAGNOSIS — Z7901 Long term (current) use of anticoagulants: Secondary | ICD-10-CM | POA: Diagnosis not present

## 2017-04-23 LAB — POCT INR: INR: 2

## 2017-04-30 ENCOUNTER — Ambulatory Visit (INDEPENDENT_AMBULATORY_CARE_PROVIDER_SITE_OTHER): Payer: Medicare Other | Admitting: Internal Medicine

## 2017-04-30 ENCOUNTER — Encounter: Payer: Self-pay | Admitting: Internal Medicine

## 2017-04-30 VITALS — BP 122/58 | HR 60 | Temp 98.9°F | Ht 69.0 in | Wt 280.4 lb

## 2017-04-30 DIAGNOSIS — R109 Unspecified abdominal pain: Secondary | ICD-10-CM

## 2017-04-30 MED ORDER — SIMETHICONE 125 MG PO CHEW: 125.0000 mg | CHEWABLE_TABLET | Freq: Four times a day (QID) | ORAL | 0 refills | Status: DC | PRN

## 2017-04-30 MED ORDER — POLYETHYLENE GLYCOL 3350 17 GM/SCOOP PO POWD: 17.0000 g | Freq: Two times a day (BID) | ORAL | 1 refills | Status: DC | PRN

## 2017-04-30 NOTE — Progress Notes (Signed)
Subjective:    Erin Coffey - 71 y.o. female MRN [id number]  Date of birth: March 30, 1946  HPI  Erin Coffey is here for follow up of abdominal pain. Seen on 8/14 for RUQ pain. RUQ [name] ordered and showed cholelithiasis. PCP discussed results with patient who wished to try conservative management with diet modifications.   Today, patient reports continued abdominal pain since August. Abdominal pain is overall improving. Has eliminated fried food from her diet. She still has RUQ pain with eating but not as frequently now. She is also endorsing LLQ pain and "gassiness". Reports history of constipation. She did not have a bowel movement for a couple days. Had a large BM this morning with some relief of her LLQ pain but reports she still feels "fullness" and discomfort this AM. Her stool was hard and she had to strain to pass it. She is not currently using Miralax because she was using it once per day without relief. She denies nausea, vomiting, dysuria, pelvic pain, vaginal discharge, vaginal bleeding, hematochezia and fevers. She has chronic back pain that has remained unchanged.    -  reports that she has never smoked. She has never used smokeless tobacco. - Review of Systems: Per HPI. - Past Medical History: Patient Active Problem List   Diagnosis Date Noted  . Diverticulosis of colon 03/24/2017  . Internal hemorrhoid 03/24/2017  . Hypertensive retinopathy of right eye 01/20/2017  . Hypermetropia of both eyes 01/20/2017  . Presbyopia 01/20/2017  . Back pain 01/05/2017  . Osteopenia 05/28/2016  . Chronic kidney disease 04/29/2016  . Normocytic anemia 04/29/2016  . Dark stools 10/19/2015  . Rotator cuff dysfunction 06/25/2015  . Thigh cramp 06/25/2015  . Long term (current) use of anticoagulants 05/08/2011  . HYPERLIPIDEMIA 08/08/2008  . Type 2 diabetes mellitus with hemoglobin A1c goal of less than 7.5% ([place]) 06/05/2008  . HYPERTENSION, BENIGN ESSENTIAL 11/27/2006  . OBESITY,  NOS 10/08/2006  . GASTROESOPHAGEAL REFLUX, NO ESOPHAGITIS 10/08/2006  . OSTEOARTHRITIS, MULTI SITES 10/08/2006   - Medications: reviewed and updated   Objective:   Physical Exam BP (!) 122/58   Pulse 60   Temp 98.9 F (37.2 C) (Oral)   Ht 5\' 9"  (1.753 m)   Wt 280 lb 6.4 oz (127.2 kg)   LMP  (Exact Date)   SpO2 98%   BMI 41.41 kg/m  Gen: NAD, alert, cooperative with exam, well-appearing Abd: SNTND, BS present, no guarding or organomegaly Skin: no rashes, normal turgor   Assessment & Plan:   1. Abdominal pain, unspecified abdominal location Suspect multifactorial. Patient still having some pain that seems to be related to cholelithiasis. However, no red flag symptoms or exam findings that would be concerning for cholecystitis. Patient declines GI and surgery referral. Labs negative for hepatitis and pancreatitis 1 month ago and patient reporting improvement of pain in the RUQ since last visit. Constipation seems to have worsened over the past few weeks. Constipation likely the cause of LLQ discomfort and gassiness. Abdominal exam is benign and reassuring that vitals are stable. Patient continues to be able to pass stools. Will treat with increased amount of Miralax starting BID with ability to increase to TID if needed. Will prescribe Simethicone. Discussed importance of adequate hydration and fiber in diet. Return precautions of fevers, worsening of abdominal pain, inability to have bowel movement, blood in stool, nausea/vomiting discussed. Follow up with PCP.  - simethicone (MYLICON) 456 MG chewable tablet; Chew 1 tablet (125 mg total) by mouth every  6 (six) hours as needed for flatulence.  Dispense: 30 tablet; Refill: 0 - polyethylene glycol powder (GLYCOLAX/MIRALAX) powder; Take 17 g by mouth 2 (two) times daily as needed.  Dispense: 3350 g; Refill: [place], D.O. 04/30/2017, 2:32 PM PGY-3, [place]

## 2017-04-30 NOTE — Patient Instructions (Signed)
You can take the Miralax twice per day and increase to three times a day if you need to. I have also prescribed Simethicone which helps with gas pains. If you have worsening of your abdominal pain, significant nausea/vomiting or fevers you need to be seen again. Follow up with Dr. Dallas Schimke.

## 2017-05-06 ENCOUNTER — Other Ambulatory Visit: Payer: Self-pay | Admitting: Internal Medicine

## 2017-05-07 ENCOUNTER — Ambulatory Visit (INDEPENDENT_AMBULATORY_CARE_PROVIDER_SITE_OTHER): Payer: Medicare Other | Admitting: *Deleted

## 2017-05-07 DIAGNOSIS — Z7901 Long term (current) use of anticoagulants: Secondary | ICD-10-CM

## 2017-05-07 LAB — POCT INR: INR: 1.9

## 2017-05-11 ENCOUNTER — Ambulatory Visit: Payer: Medicare Other | Admitting: Internal Medicine

## 2017-05-21 ENCOUNTER — Other Ambulatory Visit: Payer: Self-pay | Admitting: *Deleted

## 2017-05-21 ENCOUNTER — Ambulatory Visit (INDEPENDENT_AMBULATORY_CARE_PROVIDER_SITE_OTHER): Payer: Medicare Other | Admitting: *Deleted

## 2017-05-21 DIAGNOSIS — Z7901 Long term (current) use of anticoagulants: Secondary | ICD-10-CM

## 2017-05-21 LAB — POCT INR: INR: 2.2

## 2017-05-21 MED ORDER — LOSARTAN POTASSIUM 100 MG PO TABS: 100.0000 mg | ORAL_TABLET | Freq: Every day | ORAL | 0 refills | Status: DC

## 2017-06-04 ENCOUNTER — Ambulatory Visit (INDEPENDENT_AMBULATORY_CARE_PROVIDER_SITE_OTHER): Payer: Medicare Other | Admitting: *Deleted

## 2017-06-04 DIAGNOSIS — Z7901 Long term (current) use of anticoagulants: Secondary | ICD-10-CM | POA: Diagnosis not present

## 2017-06-04 LAB — POCT INR: INR: 1.8

## 2017-06-05 ENCOUNTER — Other Ambulatory Visit: Payer: Self-pay | Admitting: Internal Medicine

## 2017-06-18 ENCOUNTER — Ambulatory Visit (INDEPENDENT_AMBULATORY_CARE_PROVIDER_SITE_OTHER): Payer: Medicare Other | Admitting: *Deleted

## 2017-06-18 DIAGNOSIS — Z7901 Long term (current) use of anticoagulants: Secondary | ICD-10-CM

## 2017-06-19 LAB — PROTIME-INR
INR: 2.5 — ABNORMAL HIGH (ref 0.8–1.2)
Prothrombin Time: 25 s — ABNORMAL HIGH (ref 9.1–12.0)

## 2017-06-23 ENCOUNTER — Telehealth: Payer: Self-pay | Admitting: *Deleted

## 2017-06-23 NOTE — Telephone Encounter (Signed)
Patient left message on nurse line requesting return call to discuss losartan recall. Discussed with Pharm D. Patient has a rx bottle not a manufacturer bottle so unable to determine manufacturer or lot #. Patient will contact pharmacy for this information. Hubbard Hartshorn, RN, BSN

## 2017-06-23 NOTE — Telephone Encounter (Signed)
Noted  

## 2017-06-24 NOTE — Telephone Encounter (Signed)
Patient notified that recall only pertains to combination of losartan and HCTZ. This does not include patient's losartan 100 mg tabs. Patient very appreciative of call. Hubbard Hartshorn, RN, BSN

## 2017-07-09 ENCOUNTER — Ambulatory Visit (INDEPENDENT_AMBULATORY_CARE_PROVIDER_SITE_OTHER): Payer: Medicare Other | Admitting: *Deleted

## 2017-07-09 ENCOUNTER — Other Ambulatory Visit: Payer: Self-pay | Admitting: Internal Medicine

## 2017-07-09 DIAGNOSIS — Z7901 Long term (current) use of anticoagulants: Secondary | ICD-10-CM

## 2017-07-09 LAB — POCT INR: INR: 2.8

## 2017-07-13 NOTE — Progress Notes (Signed)
   [facility] Phone: [phone]   Date of Visit: 07/14/2017   HPI:  Medication management: -Patient reports that she found out that 1 of her medications for blood pressure has been recalled. -She has not been taking her losartan for the past 2-4 days.  She has been taking her Norvasc, metoprolol as prescribed -Her blood pressure today is 142/60 with a heart rate of 56 -She does check her blood pressures at home and reports they have been in the 1 teens for systolic. -Blood denies any lightheadedness or dizziness  ROS: See HPI.  [place]:  PMH: HTN Hypertensive Retinopathy of R eye GER DM2 OA Osteopenia CKD HLD Obesity  Long term use of anticoagulants   PHYSICAL EXAM: BP (!) 142/60   Pulse (!) 56   Temp 98.7 F (37.1 C) (Oral)   Wt 276 lb (125.2 kg)   SpO2 99%   BMI 40.76 kg/m  GEN: NAD CV: RRR, no murmurs, rubs, or gallops PULM: CTAB, normal effort SKIN: No rash or cyanosis; warm and well-perfused EXTR: No lower extremity edema or calf tenderness PSYCH: Mood and affect euthymic, normal rate and volume of speech NEURO: Awake, alert, no focal deficits grossly, normal speech   ASSESSMENT/PLAN:  Health maintenance:  -Patient declined diabetic foot exam today and reports she will do it at the next visit  Medication management: We discussed that only certain lot number was affected by the recall.  And according to my research it is not her dose that she is on.  Like to be sure I recommended that she call her pharmacy to verify that her lot number is not affected.  Additionally her blood pressure is essentially at goal at 142/60.  She reports that she has not been taking her losartan 100 mg daily for the past 3-4 days.  Therefore to avoid hypotension will decrease losartan to 25 mg daily.  Additionally her pulse is 56 and she is asymptomatic.  Repeat heart rate check was around the same number.  Therefore we will decrease metoprolol tartrate to  12.5 mg twice daily.  This patient to monitor her blood pressure at home and also come in for nurse visit for blood pressure check in 1 week.  Patient to call me if home blood pressures are above 140/90.   Smiley Houseman, MD PGY [place]

## 2017-07-14 ENCOUNTER — Other Ambulatory Visit: Payer: Self-pay

## 2017-07-14 ENCOUNTER — Encounter: Payer: Self-pay | Admitting: Internal Medicine

## 2017-07-14 ENCOUNTER — Ambulatory Visit (INDEPENDENT_AMBULATORY_CARE_PROVIDER_SITE_OTHER): Payer: Medicare Other | Admitting: Internal Medicine

## 2017-07-14 VITALS — BP 142/60 | HR 56 | Temp 98.7°F | Wt 276.0 lb

## 2017-07-14 DIAGNOSIS — Z79899 Other long term (current) drug therapy: Secondary | ICD-10-CM

## 2017-07-14 MED ORDER — METOPROLOL TARTRATE 25 MG PO TABS: 12.5000 mg | ORAL_TABLET | Freq: Two times a day (BID) | ORAL | 0 refills | Status: DC

## 2017-07-14 MED ORDER — LOSARTAN POTASSIUM 25 MG PO TABS: 25.0000 mg | ORAL_TABLET | Freq: Every day | ORAL | 0 refills | Status: DC

## 2017-07-14 NOTE — Patient Instructions (Signed)
We decreased your Metoprolol to 12.5mg  twice a day We also decreased your Losartan to 25mg  daily. Check your blood pressure and make sure it is < 140/90. If it is consistently elevated, please call me  Make a nurse visit in about 1 week for blood pressure check.

## 2017-07-15 ENCOUNTER — Encounter: Payer: Self-pay | Admitting: Internal Medicine

## 2017-07-21 ENCOUNTER — Ambulatory Visit: Payer: Medicare Other

## 2017-07-24 ENCOUNTER — Ambulatory Visit (INDEPENDENT_AMBULATORY_CARE_PROVIDER_SITE_OTHER): Payer: Medicare Other | Admitting: *Deleted

## 2017-07-24 VITALS — BP 144/78 | HR 61

## 2017-07-24 DIAGNOSIS — I10 Essential (primary) hypertension: Secondary | ICD-10-CM

## 2017-07-24 DIAGNOSIS — Z0130 Encounter for examination of blood pressure without abnormal findings: Secondary | ICD-10-CM

## 2017-07-24 NOTE — Progress Notes (Signed)
   Patient presents for BP check after decreasing losartan to 25 mg daily Had also decreased metoprolol to 12.5 mg bid but by day four began having heart palpitations so she resumed taking 25 mg bid and palpitations stopped.  Home BP readings have ranged 114-134 / 53-59 Home heart rate 51-63  Vitals:   07/24/17 1030  BP: (!) 144/78  Pulse: 61  SpO2: 99%   Patient states except for arthritis pain she feels "really good."  L. Silvano Rusk, RN, BSN

## 2017-08-06 ENCOUNTER — Ambulatory Visit: Payer: Medicare Other

## 2017-08-06 ENCOUNTER — Ambulatory Visit (INDEPENDENT_AMBULATORY_CARE_PROVIDER_SITE_OTHER): Payer: Medicare Other | Admitting: *Deleted

## 2017-08-06 DIAGNOSIS — Z7901 Long term (current) use of anticoagulants: Secondary | ICD-10-CM | POA: Diagnosis not present

## 2017-08-06 LAB — POCT INR: INR: 2.1

## 2017-08-20 ENCOUNTER — Telehealth: Payer: Self-pay | Admitting: *Deleted

## 2017-08-20 DIAGNOSIS — I10 Essential (primary) hypertension: Secondary | ICD-10-CM

## 2017-08-20 NOTE — Telephone Encounter (Signed)
Patient called in asking what to do now that there has been a new losartan recall. Instructed patient to call pharmacy as they will have a list of lot numbers to know if patient's med has been recalled. Hubbard Hartshorn, RN, BSN

## 2017-08-24 ENCOUNTER — Other Ambulatory Visit: Payer: Self-pay | Admitting: Internal Medicine

## 2017-08-24 DIAGNOSIS — I10 Essential (primary) hypertension: Secondary | ICD-10-CM

## 2017-08-24 MED ORDER — LOSARTAN POTASSIUM 25 MG PO TABS: 25.0000 mg | ORAL_TABLET | Freq: Every day | ORAL | 0 refills | Status: DC

## 2017-08-24 NOTE — Telephone Encounter (Signed)
Patient left message on nurse line stating she has received letter from [facility] stating her losartan has been recalled and she needs to know what to do now. Please call pt at [phone]. Hubbard Hartshorn, RN, BSN

## 2017-08-24 NOTE — Addendum Note (Signed)
Addended by: Smiley Houseman on: 08/24/2017 07:51 PM   Modules accepted: Orders

## 2017-08-24 NOTE — Telephone Encounter (Signed)
Called patient back. Asked to discard her current losartan pills. Check BMP to ensure Cr is stable. Resent Rx for losartan.

## 2017-08-25 ENCOUNTER — Other Ambulatory Visit: Payer: Medicare Other

## 2017-08-25 DIAGNOSIS — I10 Essential (primary) hypertension: Secondary | ICD-10-CM

## 2017-08-26 ENCOUNTER — Telehealth: Payer: Self-pay | Admitting: *Deleted

## 2017-08-26 LAB — BASIC METABOLIC PANEL
BUN/Creatinine Ratio: 13 (ref 12–28)
BUN: 18 mg/dL (ref 8–27)
CO2: 22 mmol/L (ref 20–29)
Calcium: 9.3 mg/dL (ref 8.7–10.3)
Chloride: 103 mmol/L (ref 96–106)
Creatinine, Ser: 1.4 mg/dL — ABNORMAL HIGH (ref 0.57–1.00)
GFR calc Af Amer: 44 mL/min/{1.73_m2} — ABNORMAL LOW (ref >59)
GFR calc non Af Amer: 38 mL/min/{1.73_m2} — ABNORMAL LOW (ref >59)
Glucose: 72 mg/dL (ref 65–99)
Potassium: 4.7 mmol/L (ref 3.5–5.2)
Sodium: 138 mmol/L (ref 134–144)

## 2017-08-26 NOTE — Telephone Encounter (Signed)
Patient left message on nurse line requesting return call from PCP as soon as possible. No details given. Hubbard Hartshorn, RN, BSN

## 2017-08-27 ENCOUNTER — Other Ambulatory Visit: Payer: Self-pay | Admitting: Internal Medicine

## 2017-08-27 DIAGNOSIS — Z7901 Long term (current) use of anticoagulants: Secondary | ICD-10-CM

## 2017-08-27 DIAGNOSIS — Z86711 Personal history of pulmonary embolism: Secondary | ICD-10-CM

## 2017-08-27 NOTE — Telephone Encounter (Signed)
Patient left message on nurse line stating that med that was sent to pharmacy on 1/14 (losartan) is same med that has been recalled. Please call pt at [phone] to discuss. Hubbard Hartshorn, RN, BSN

## 2017-08-27 NOTE — Telephone Encounter (Signed)
Please refer to other phone note.  °

## 2017-08-27 NOTE — Telephone Encounter (Signed)
Called patient to clarify. The losartan sent to the pharmacy is from a different manufacturer and is okay to take. I also discussed with her about her long term anticoagulation. The recommendations are not clear cut on weather patient should be on life long anticoag or not. Per chart review, what she had seemed like a unprovoked PE but her PE was pretty extensive. There for will refer to hematology for recommendations.

## 2017-09-03 ENCOUNTER — Encounter: Payer: Self-pay | Admitting: Hematology

## 2017-09-03 ENCOUNTER — Ambulatory Visit (INDEPENDENT_AMBULATORY_CARE_PROVIDER_SITE_OTHER): Payer: Medicare Other | Admitting: *Deleted

## 2017-09-03 ENCOUNTER — Telehealth: Payer: Self-pay | Admitting: Hematology

## 2017-09-03 DIAGNOSIS — Z7901 Long term (current) use of anticoagulants: Secondary | ICD-10-CM

## 2017-09-03 LAB — POCT INR: INR: 2.7

## 2017-09-03 NOTE — Telephone Encounter (Signed)
Appt has been scheduled for the pt to see Dr. Irene Limbo on 2/26 at 10am. Pt aware to arrive 30 minutes early. Address verified. Letter mailed to the pt and referring office notified.

## 2017-10-01 ENCOUNTER — Ambulatory Visit (INDEPENDENT_AMBULATORY_CARE_PROVIDER_SITE_OTHER): Payer: Medicare Other | Admitting: *Deleted

## 2017-10-01 ENCOUNTER — Other Ambulatory Visit: Payer: Self-pay | Admitting: Internal Medicine

## 2017-10-01 DIAGNOSIS — Z7901 Long term (current) use of anticoagulants: Secondary | ICD-10-CM

## 2017-10-01 LAB — POCT INR: INR: 1.9

## 2017-10-06 ENCOUNTER — Telehealth: Payer: Self-pay

## 2017-10-06 ENCOUNTER — Inpatient Hospital Stay: Payer: Medicare Other | Attending: Hematology | Admitting: Hematology

## 2017-10-06 VITALS — BP 188/76 | HR 74 | Temp 97.9°F | Resp 20 | Ht 69.0 in | Wt 281.8 lb

## 2017-10-06 DIAGNOSIS — Z7901 Long term (current) use of anticoagulants: Secondary | ICD-10-CM | POA: Diagnosis not present

## 2017-10-06 DIAGNOSIS — Z79899 Other long term (current) drug therapy: Secondary | ICD-10-CM

## 2017-10-06 DIAGNOSIS — J90 Pleural effusion, not elsewhere classified: Secondary | ICD-10-CM | POA: Insufficient documentation

## 2017-10-06 DIAGNOSIS — I2692 Saddle embolus of pulmonary artery without acute cor pulmonale: Secondary | ICD-10-CM

## 2017-10-06 DIAGNOSIS — Z86718 Personal history of other venous thrombosis and embolism: Secondary | ICD-10-CM | POA: Insufficient documentation

## 2017-10-06 DIAGNOSIS — D6859 Other primary thrombophilia: Secondary | ICD-10-CM

## 2017-10-06 DIAGNOSIS — Z86711 Personal history of pulmonary embolism: Secondary | ICD-10-CM | POA: Diagnosis not present

## 2017-10-06 NOTE — Progress Notes (Signed)
HEMATOLOGY/ONCOLOGY CONSULTATION NOTE  Date of Service: 10/06/2017  Patient Care Team: Smiley Houseman, MD as PCP - General Marica Otter, OD (Optometry) Jamal Maes, MD (Nephrology) Karie Chimera, MD as Consulting Physician (Neurosurgery)  CHIEF COMPLAINTS/PURPOSE OF CONSULTATION:  H/o of PE, long term use of Anticoagulants    HISTORY OF PRESENTING ILLNESS:   Erin Coffey is a wonderful 72 y.o. female who has been referred to [name] by her PCP Dr. Dorina Hoyer for evaluation of long term anticoagulants.   She has been on Warfarin since 04/2011 due to a history of PE.   She notes she has been on Coumadin since 2012. She started having pain in 04/28/11 in her legs and lower back. She had sharp pain in her chest that morning and rose before passing out. She was taken to hospital and CT Angio showed saddle pulmonary embolus with numerous other clots.  She denies known Provoking/trigger factors that were considered to an etiology for her PE.  She denies having further workup at that time. She notes she has had 3 children with no blood clots, knee surgery and hysterectomy with no blood clots.   She denies family history of blood or clotting disorders. She notes her sister passed from leukemia.  Given her unprovoked PE she has been on Coumadin since. She has been tolerating Coumadin well.  She has been improving in her health, she is no longer on DM medication. She is overall doing well.  No new leg swelling or pain. No chest pain or shortness of breathness at this time.   On review of symptoms, pt notes to having gallstones. Mild arthritis pain. She denies abdominal pain.    MEDICAL HISTORY:  Past Medical History:  Diagnosis Date  . Allergy   . Anemia, iron deficiency   . ANXIETY, SITUATIONAL 04/06/2009   Qualifier: Diagnosis of  By: [name]  MD, Colletta [place]    . Arthritis   . BACK PAIN W/RADIATION, UNSPECIFIED 10/08/2006   Qualifier: Diagnosis of  By: [name]      . Back pain, thoracic 12/20/2012  . Carpal tunnel syndrome 12/07/2014  . Cholelithiasis 06/25/2012  . Chronic kidney disease   . Diabetes mellitus   . DM type 2 goal A1C below 7.5 06/05/2008   Qualifier: Diagnosis of  By: [name]  MD, Colletta [place]    . GASTROESOPHAGEAL REFLUX, NO ESOPHAGITIS 10/08/2006   Qualifier: Diagnosis of  By: [name]    . GERD (gastroesophageal reflux disease)   . Goiter, unspecified 10/21/2007   Qualifier: Diagnosis of  By: [name] MD, [name]    . History of DVT (deep vein thrombosis) 05/05/2013  . Hx of pulmonary embolus 05/08/2011  . HYPERLIPIDEMIA 08/08/2008   Qualifier: Diagnosis of  By: [name]  MD, Colletta [place]    . Hypertension   . HYPERTENSION, BENIGN ESSENTIAL 11/27/2006   Qualifier: Diagnosis of  By: [name] MD, [place]    . Left tennis elbow 11/02/2014  . Long term (current) use of anticoagulants 05/08/2011  . Palpitations 10/01/2012  . Personal history of colonic polyps 10/24/2009   Qualifier: Diagnosis of  By: [name] MD, Sandy Salaam   . Pica 08/08/2009   Qualifier: Diagnosis of  By: [name], [name]    . Polymyalgia rheumatica ([place]) 10/02/2009   Qualifier: Diagnosis of  By: [name] MD, Apolonio Schneiders    . Pulmonary embolism ([place]) 04/28/11  . RENAL FAILURE 10/18/2009   Qualifier: History of  By: [name]  MD, Colletta [place]    . Thyroid disease   .  Trigger ring finger of left hand 11/02/2014  . UNSPECIFIED IRON DEFICIENCY ANEMIA 10/02/2009   Qualifier: Diagnosis of  By: [name] MD, Apolonio Schneiders      SURGICAL HISTORY: Past Surgical History:  Procedure Laterality Date  . ABDOMINAL HYSTERECTOMY    . CARDIAC CATHETERIZATION  04/29/11   Clean cardiac cath, no blockages.   . CARPAL TUNNEL RELEASE Right 1996  . KNEE ARTHROSCOPY  Bilateral  . TUBAL LIGATION      SOCIAL HISTORY: Social History   Socioeconomic History  . Marital status: Widowed    Spouse name: Not on file  . Number of children: 3  . Years of education: 10  . Highest education level: Not on file  Social Needs  .  Financial resource strain: Not on file  . Food insecurity - worry: Not on file  . Food insecurity - inability: Not on file  . Transportation needs - medical: Not on file  . Transportation needs - non-medical: Not on file  Occupational History  . Occupation: Retired- Airline pilot: [employer]  Tobacco Use  . Smoking status: Never Smoker  . Smokeless tobacco: Never Used  Substance and Sexual Activity  . Alcohol use: No    Alcohol/week: 0.0 oz  . Drug use: No  . Sexual activity: Not Currently  Other Topics Concern  . Not on file  Social History Narrative   Health Care POA:    Emergency Contact: 1. Baltazar Apo [phone] (First contact)                                      2. daughter, Marlene Lard, [phone] (second contact)   Two dgt and one son   End of Life Plan: gave pt AD information phamplet   Who lives with you: self   Any pets: none   Diet: Pt has a varied diet of protein, starch and vegetables.   Exercise: Pt reports being very active   Seatbelts: Pt reports wearing seatbelt when in vehicles.    Hobbies: cooking, crossword puzzles.       FAMILY HISTORY: Family History  Problem Relation Age of Onset  . Diabetes Mother   . Heart disease Mother   . Hypertension Mother   . Heart disease Father   . Hypertension Father   . Diabetes Sister   . Heart disease Sister   . Hypertension Brother   . Cancer Sister 1       lukemia  . Cirrhosis Sister        alcohol  . Kidney disease Sister   . Diabetes Sister   . Diabetes Brother   . Colon cancer Neg Hx     ALLERGIES:  is allergic to lisinopril.  MEDICATIONS:  Current Outpatient Medications  Medication Sig Dispense Refill  . amLODipine (NORVASC) 5 MG tablet take 1 tablet by mouth once daily 90 tablet 1  . atorvastatin (LIPITOR) 40 MG tablet Take 1 tablet (40 mg total) by mouth daily. 90 tablet 3  . baclofen (LIORESAL) 10 MG tablet Take 1 tablet (10 mg total) by mouth daily as needed for muscle spasms.  (Patient not taking: Reported on 03/24/2017) 30 each 0  . Blood Glucose Monitoring Suppl (RELION CONFIRM GLUCOSE MONITOR) w/Device KIT Please provide 1 glucometer 1 kit 0  . calcium-vitamin D (OSCAL-500) 500-400 MG-UNIT tablet Take 1 tablet by mouth 2 (two) times daily. 60 tablet 0  .  fish oil-omega-3 fatty acids 1000 MG capsule Take 1,200 mg by mouth daily.     [name] glucose blood test strip Use as instructed 100 each 12  . iron polysaccharides (FERREX 150) 150 MG capsule Take 1 capsule (150 mg total) by mouth daily. 60 capsule 5  . ketoconazole (NIZORAL) 2 % cream apply to affected area as directed twice a day 60 g 0  . losartan (COZAAR) 25 MG tablet take 1 tablet by mouth once daily 30 tablet 3  . metoprolol tartrate (LOPRESSOR) 25 MG tablet Take 0.5 tablets (12.5 mg total) by mouth 2 (two) times daily. 180 tablet 0  . omeprazole (PRILOSEC) 20 MG capsule Take 20 mg by mouth as needed. Reported on 12/17/2015    . polyethylene glycol powder (GLYCOLAX/MIRALAX) powder Take 17 g by mouth 2 (two) times daily as needed. 3350 g 1  . simethicone (MYLICON) 086 MG chewable tablet Chew 1 tablet (125 mg total) by mouth every 6 (six) hours as needed for flatulence. 30 tablet 0  . traMADol (ULTRAM) 50 MG tablet Take 2 tablets (100 mg total) by mouth every 12 (twelve) hours as needed. 120 tablet 0  . triamcinolone cream (KENALOG) 0.1 % apply to affected area twice a day 30 g 2  . warfarin (COUMADIN) 5 MG tablet take as directed 45 tablet 11   No current facility-administered medications for this visit.     REVIEW OF SYSTEMS:   .10 Point review of Systems was done is negative except as noted above.   PHYSICAL EXAMINATION: ECOG PERFORMANCE STATUS: 1 - Symptomatic but completely ambulatory  . Vitals:   10/06/17 0935  BP: (!) 188/76  Pulse: 74  Resp: 20  Temp: 97.9 F (36.6 C)  SpO2: 100%   Filed Weights   10/06/17 0935  Weight: 281 lb 12.8 oz (127.8 kg)   .Body mass index is 41.61  kg/m. [name] GENERAL:alert, in no acute distress and comfortable SKIN: no acute rashes, no significant lesions EYES: conjunctiva are pink and non-injected, sclera anicteric OROPHARYNX: MMM, no exudates, no oropharyngeal erythema or ulceration NECK: supple, no JVD LYMPH:  no palpable lymphadenopathy in the cervical, axillary or inguinal regions LUNGS: clear to auscultation b/l with normal respiratory effort HEART: regular rate & rhythm ABDOMEN:  normoactive bowel sounds , non tender, not distended. Extremity: no pedal edema PSYCH: alert & oriented x 3 with fluent speech NEURO: no focal motor/sensory deficits   LABORATORY DATA:  I have reviewed the data as listed  . CBC Latest Ref Rng & Units 03/24/2017 10/24/2016 10/19/2015  WBC 3.4 - 10.8 x10E3/uL 4.9 4.6 4.4  Hemoglobin 11.1 - 15.9 g/dL 11.1 11.2(L) 10.7(L)  Hematocrit 34.0 - 46.6 % 34.6 34.7(L) 32.2(L)  Platelets 150 - 379 x10E3/uL 219 177 210    . CMP Latest Ref Rng & Units 08/25/2017 03/24/2017 10/24/2016  Glucose 65 - 99 mg/dL 72 89 128(H)  BUN 8 - 27 mg/dL '18 14 15  '$ Creatinine 0.57 - 1.00 mg/dL 1.40(H) 1.31(H) 1.11(H)  Sodium 134 - 144 mmol/L 138 140 137  Potassium 3.5 - 5.2 mmol/L 4.7 4.5 4.1  Chloride 96 - 106 mmol/L 103 105 103  CO2 20 - 29 mmol/L '22 22 26  '$ Calcium 8.7 - 10.3 mg/dL 9.3 9.4 9.2  Total Protein 6.0 - 8.5 g/dL - 8.2 -  Total Bilirubin 0.0 - 1.2 mg/dL - 0.5 -  Alkaline Phos 39 - 117 IU/L - 80 -  AST 0 - 40 IU/L - 24 -  ALT 0 -  32 IU/L - 16 -    RADIOGRAPHIC STUDIES: I have personally reviewed the radiological images as listed and agreed with the findings in the report. No results found.   CT Angio Chest PE 04/30/11 IMPRESSION: Saddle pulmonary embolus with additional pulmonary emboli at the bifurcation of the main pulmonary arteries and extending into the lower lobe pulmonary arteries bilaterally.  Overall clot burden is moderate to large.  Mild cardiomegaly.  No definite findings on CT to suggest  right heart strain.  Small right pleural effusion.  Critical Value/emergent results were called by telephone at the time of interpretation on 04/30/2011  at 1100 hours  to  Dr. Peter [name], who verbally acknowledged these results.   ASSESSMENT & PLAN:    Erin Coffey is a 72 y.o. African-American female with  1. H/o Saddle Pulmonary Embolism  -She has a unprovoked Saddle PE on 04/28/11 -Currently on Warfarin since 04/2011 -No significant family history of blood or clotting disorders.  -She is a non-smoker  No recurrent VTE events on coumadin while on anticoagulation. No issues with bleeding on coumadin.  PLAN:  -I discussed her unprovoked PE was significant and the likelihood of recurrence with another significant VTE is still high (10-15% per year off anticoagulation).  -I do not recommend discontinuing her long term anticoagulant given her risk for bleeding is less then her risk of another significant clot. -She denies bleeding issues with coumadin. She is tolerating well. I discussed with her other anticoagulation options. I discussed the risk of bleeding and failing blood thinners is lower with newer medication.  -She opted to continue with Coumadin and will have PCP continue to check her coumadin levels regularly.  -She does have the option to do further blood workup to rule out any blood disorder that contributed to her PE. Results would not change her duration of blood thinners. She declined further blood testing for now.  -I recommend age appropriate cancer screening with her PCP and other physicians. She is due for mammogram in 10/2017 and her last colonoscopy was 12/17/15 -patient had severe questions which were answered in details. -she will continue to f/u with her PCP for continued monitoring and mx of Coumadin.    2.  Past Medical History:  Diagnosis Date  . Allergy   . Anemia, iron deficiency   . ANXIETY, SITUATIONAL 04/06/2009   Qualifier: Diagnosis of  By:  [name]  MD, Colletta [place]    . Arthritis   . BACK PAIN W/RADIATION, UNSPECIFIED 10/08/2006   Qualifier: Diagnosis of  By: [name]    . Back pain, thoracic 12/20/2012  . Carpal tunnel syndrome 12/07/2014  . Cholelithiasis 06/25/2012  . Chronic kidney disease   . Diabetes mellitus   . DM type 2 goal A1C below 7.5 06/05/2008   Qualifier: Diagnosis of  By: [name]  MD, Colletta [place]    . GASTROESOPHAGEAL REFLUX, NO ESOPHAGITIS 10/08/2006   Qualifier: Diagnosis of  By: [name]    . GERD (gastroesophageal reflux disease)   . Goiter, unspecified 10/21/2007   Qualifier: Diagnosis of  By: [name] MD, [name]    . History of DVT (deep vein thrombosis) 05/05/2013  . Hx of pulmonary embolus 05/08/2011  . HYPERLIPIDEMIA 08/08/2008   Qualifier: Diagnosis of  By: [name]  MD, Colletta Maryland    . Hypertension   . HYPERTENSION, BENIGN ESSENTIAL 11/27/2006   Qualifier: Diagnosis of  By: [name] MD, [place]    . Left tennis elbow 11/02/2014  . Long term (current) use of anticoagulants  05/08/2011  . Palpitations 10/01/2012  . Personal history of colonic polyps 10/24/2009   Qualifier: Diagnosis of  By: [name] MD, Sandy Salaam   . Pica 08/08/2009   Qualifier: Diagnosis of  By: [name], [name]    . Polymyalgia rheumatica ([place]) 10/02/2009   Qualifier: Diagnosis of  By: [name] MD, Apolonio Schneiders    . Pulmonary embolism ([place]) 04/28/11  . RENAL FAILURE 10/18/2009   Qualifier: History of  By: [name]  MD, Colletta [place]    . Thyroid disease   . Trigger ring finger of left hand 11/02/2014  . UNSPECIFIED IRON DEFICIENCY ANEMIA 10/02/2009   Qualifier: Diagnosis of  By: [name] MD, Apolonio Schneiders    -DM well controlled --Continue to be followed by PCP for this.    RTC with Dr. Irene Limbo only as needed    All of the patients questions were answered with apparent satisfaction. The patient knows to call the clinic with any problems, questions or concerns.  I spent 40 minutes counseling the patient face to face. The total time spent in the appointment was 50 minutes and  more than 50% was on counseling and direct patient cares.    Sullivan Lone MD [place] AAHIVMS [facility] Hematology/Oncology Physician [facility]  (Office):       [phone] (Work cell):  [phone] (Fax):           [phone]  10/06/2017 9:41 AM  This document serves as a record of services personally performed by Sullivan Lone, MD. It was created on his behalf by Joslyn Devon, a trained medical scribe. The creation of this record is based on the scribe's personal observations and the provider's statements to them.    .I have reviewed the above documentation for accuracy and completeness, and I agree with the above. Brunetta Genera MD mS

## 2017-10-06 NOTE — Telephone Encounter (Signed)
RTC with Dr Irene Limbo as needed Per 2/26 los

## 2017-10-07 ENCOUNTER — Other Ambulatory Visit: Payer: Self-pay | Admitting: Internal Medicine

## 2017-10-12 ENCOUNTER — Telehealth: Payer: Self-pay

## 2017-10-12 NOTE — Telephone Encounter (Signed)
Returned patient call. Discussed that she needs to get in touch with pharmacy to ensure the lot numbers for Losartan and Norvasc are not the ones affected by the recall.   We also discussed the option of switching from Coumadin to NOAC. Patient is not interested in this and feels safer getting INR checked.

## 2017-10-12 NOTE — Telephone Encounter (Signed)
Pt left message on nurse line. Would like to speak to MD regarding losartan recall. Pt call back [phone] Wallace Cullens, RN

## 2017-10-15 ENCOUNTER — Ambulatory Visit (INDEPENDENT_AMBULATORY_CARE_PROVIDER_SITE_OTHER): Payer: Medicare Other | Admitting: *Deleted

## 2017-10-15 DIAGNOSIS — E119 Type 2 diabetes mellitus without complications: Secondary | ICD-10-CM | POA: Diagnosis not present

## 2017-10-15 DIAGNOSIS — Z7901 Long term (current) use of anticoagulants: Secondary | ICD-10-CM

## 2017-10-15 LAB — POCT GLYCOSYLATED HEMOGLOBIN (HGB A1C): Hemoglobin A1C: 5.8

## 2017-10-15 LAB — POCT INR: INR: 2.7

## 2017-10-16 ENCOUNTER — Other Ambulatory Visit: Payer: Self-pay | Admitting: Internal Medicine

## 2017-10-26 ENCOUNTER — Telehealth: Payer: Self-pay

## 2017-10-26 NOTE — Telephone Encounter (Signed)
Pt called to update pharmacy to [place]. Updated in epic Wallace Cullens, RN

## 2017-10-29 ENCOUNTER — Ambulatory Visit (INDEPENDENT_AMBULATORY_CARE_PROVIDER_SITE_OTHER): Payer: Medicare Other | Admitting: Family Medicine

## 2017-10-29 DIAGNOSIS — J019 Acute sinusitis, unspecified: Secondary | ICD-10-CM | POA: Diagnosis not present

## 2017-10-29 DIAGNOSIS — J329 Chronic sinusitis, unspecified: Secondary | ICD-10-CM

## 2017-10-29 MED ORDER — AMOXICILLIN-POT CLAVULANATE 875-125 MG PO TABS: 1.0000 | ORAL_TABLET | Freq: Two times a day (BID) | ORAL | 0 refills | Status: DC

## 2017-10-29 NOTE — Patient Instructions (Signed)
I sent in an antibiotic prescription for your sinus infection. You can also use Afrin nasal spray for quick relief - but not more than three days. I suggest you get on a daily non sedating antihistamine (Allegra, claritin or zyrtec) every day during the spring allergy season. Of course, see [name] again if not improving.

## 2017-10-30 ENCOUNTER — Encounter: Payer: Self-pay | Admitting: Family Medicine

## 2017-10-30 NOTE — Assessment & Plan Note (Signed)
Meets criteria for antibiotics for acute sinusitis.

## 2017-10-30 NOTE — Progress Notes (Signed)
   Subjective:    Patient ID: Erin Coffey, female    DOB: 20-Nov-1945, 72 y.o.   MRN: [id number]  HPI C/O sinus pressure, bilateral ear pain, lower jaw pin (edentulous in lower jaw) and cough.  SX have been present x 7 days.  No fever or shortness of breath.  Has had previous sinus infections - most associated with springtime allergies.  Not taking any regular anti allergy medicine.  She rates her facial pain/pressure as severe.    Review of Systems     Objective:   Physical Exam  Sinuses frontal and maxillary bilateral tender to percussion.  No overlying skin changes.  TMs normal.  Throat/mouth normal. Neck no significant nodes. Lungs clear.        Assessment & Plan:

## 2017-11-12 ENCOUNTER — Ambulatory Visit (INDEPENDENT_AMBULATORY_CARE_PROVIDER_SITE_OTHER): Payer: Medicare Other | Admitting: *Deleted

## 2017-11-12 DIAGNOSIS — Z7901 Long term (current) use of anticoagulants: Secondary | ICD-10-CM

## 2017-11-12 LAB — POCT INR: INR: 2

## 2017-11-23 DIAGNOSIS — Z1231 Encounter for screening mammogram for malignant neoplasm of breast: Secondary | ICD-10-CM | POA: Diagnosis not present

## 2017-12-02 NOTE — Progress Notes (Signed)
   [facility] Phone: [phone]   Date of Visit: 12/03/2017   HPI:  HTN:  - Medication: Norvasc 5mg  daily, Losartan 25mg  daily - no issues with medication  - no HA, chest pain, LE swelling - watching her salt intake   Chronic Anticoagulation:  - was seen by heme to see if she needed to be on lifelong anticoagulation due to her history of PE x 1.  - discussed the benefits of switching to Xarelto. Patient is not interested.   HLD: - taking Atorvastatin 40mg  three times a week. If she takes daily, she does get muscle aches.   ROS: See HPI.  [place]:  PMH: HTN GER DM2 CKD3 Colonic Diverticulosis Osteopenia OA Obesity HLD  PHYSICAL EXAM: BP 124/72   Pulse (!) 58   Temp 97.9 F (36.6 C) (Oral)   Wt 281 lb (127.5 kg)   SpO2 99%   BMI 41.50 kg/m  GEN: NAD CV: RRR, no murmurs, rubs, or gallops PULM: CTAB, normal effort SKIN: No rash or cyanosis; warm and well-perfused EXTR: No lower extremity edema or calf tenderness PSYCH: Mood and affect euthymic, normal rate and volume of speech NEURO: Awake, alert, no focal deficits grossly, normal speech Diabetic Foot Exam - Simple   Simple Foot Form Visual Inspection No deformities, no ulcerations, no other skin breakdown bilaterally:  Yes Sensation Testing Intact to touch and monofilament testing bilaterally:  Yes Pulse Check Posterior Tibialis and Dorsalis pulse intact bilaterally:  Yes Comments     ASSESSMENT/PLAN:  Health maintenance:  - Shingrix sent to pharmacy   HYPERTENSION, BENIGN ESSENTIAL BP well controlled. Continue current regimen   Long term (current) use of anticoagulants discussed the benefits of switching to Xarelto. Patient is not interested.   HYPERLIPIDEMIA Lipid panel as future order. Continue Lipitor 40mg  three times a week.   Smiley Houseman, MD PGY [place]

## 2017-12-03 ENCOUNTER — Encounter: Payer: Self-pay | Admitting: Internal Medicine

## 2017-12-03 ENCOUNTER — Other Ambulatory Visit: Payer: Self-pay

## 2017-12-03 ENCOUNTER — Ambulatory Visit (INDEPENDENT_AMBULATORY_CARE_PROVIDER_SITE_OTHER): Payer: Medicare Other | Admitting: Internal Medicine

## 2017-12-03 VITALS — BP 124/72 | HR 58 | Temp 97.9°F | Wt 281.0 lb

## 2017-12-03 DIAGNOSIS — I10 Essential (primary) hypertension: Secondary | ICD-10-CM | POA: Diagnosis not present

## 2017-12-03 DIAGNOSIS — Z23 Encounter for immunization: Secondary | ICD-10-CM

## 2017-12-03 DIAGNOSIS — Z7901 Long term (current) use of anticoagulants: Secondary | ICD-10-CM | POA: Diagnosis not present

## 2017-12-03 DIAGNOSIS — E785 Hyperlipidemia, unspecified: Secondary | ICD-10-CM | POA: Diagnosis not present

## 2017-12-03 LAB — POCT INR: INR: 2.5

## 2017-12-03 MED ORDER — ZOSTER VAC RECOMB ADJUVANTED 50 MCG/0.5ML IM SUSR: 0.5000 mL | Freq: Once | INTRAMUSCULAR | 1 refills | Status: AC

## 2017-12-03 NOTE — Assessment & Plan Note (Signed)
Lipid panel as future order. Continue Lipitor 40mg  three times a week.

## 2017-12-03 NOTE — Patient Instructions (Addendum)
A1c 5.8 in 10/2017  I sent the prescription for shingles vaccine to the pharmacy.   Follow up in about 5 months

## 2017-12-03 NOTE — Assessment & Plan Note (Signed)
discussed the benefits of switching to Xarelto. Patient is not interested.

## 2017-12-03 NOTE — Assessment & Plan Note (Signed)
BP well-controlled -Continue current regimen 

## 2017-12-31 ENCOUNTER — Other Ambulatory Visit: Payer: Self-pay | Admitting: *Deleted

## 2017-12-31 ENCOUNTER — Ambulatory Visit (INDEPENDENT_AMBULATORY_CARE_PROVIDER_SITE_OTHER): Payer: Medicare Other | Admitting: *Deleted

## 2017-12-31 DIAGNOSIS — E785 Hyperlipidemia, unspecified: Secondary | ICD-10-CM | POA: Diagnosis not present

## 2017-12-31 DIAGNOSIS — Z7901 Long term (current) use of anticoagulants: Secondary | ICD-10-CM

## 2017-12-31 DIAGNOSIS — Z86711 Personal history of pulmonary embolism: Secondary | ICD-10-CM

## 2017-12-31 LAB — HM DIABETES EYE EXAM

## 2017-12-31 LAB — POCT INR: INR: 2 (ref 2.0–3.0)

## 2017-12-31 MED ORDER — BACLOFEN 10 MG PO TABS: 10.0000 mg | ORAL_TABLET | Freq: Every day | ORAL | 0 refills | Status: DC | PRN

## 2017-12-31 MED ORDER — TRAMADOL HCL 50 MG PO TABS: 50.0000 mg | ORAL_TABLET | Freq: Two times a day (BID) | ORAL | 0 refills | Status: DC | PRN

## 2018-01-01 ENCOUNTER — Encounter: Payer: Self-pay | Admitting: Internal Medicine

## 2018-01-01 LAB — LIPID PANEL
Chol/HDL Ratio: 2.1 ratio (ref 0.0–4.4)
Cholesterol, Total: 160 mg/dL (ref 100–199)
HDL: 77 mg/dL (ref >39)
LDL Calculated: 75 mg/dL (ref 0–99)
Triglycerides: 40 mg/dL (ref 0–149)
VLDL Cholesterol Cal: 8 mg/dL (ref 5–40)

## 2018-01-07 ENCOUNTER — Other Ambulatory Visit: Payer: Self-pay | Admitting: Neurosurgery

## 2018-01-07 DIAGNOSIS — S32010A Wedge compression fracture of first lumbar vertebra, initial encounter for closed fracture: Secondary | ICD-10-CM

## 2018-01-09 ENCOUNTER — Other Ambulatory Visit: Payer: Medicare Other

## 2018-01-12 ENCOUNTER — Ambulatory Visit
Admission: RE | Admit: 2018-01-12 | Discharge: 2018-01-12 | Disposition: A | Discharge status: Home / Self Care | Payer: Medicare Other | Source: Ambulatory Visit | Attending: Neurosurgery | Admitting: Neurosurgery

## 2018-01-12 DIAGNOSIS — S22000A Wedge compression fracture of unspecified thoracic vertebra, initial encounter for closed fracture: Secondary | ICD-10-CM | POA: Diagnosis not present

## 2018-01-12 DIAGNOSIS — S32010A Wedge compression fracture of first lumbar vertebra, initial encounter for closed fracture: Secondary | ICD-10-CM

## 2018-01-12 MED ORDER — GADOBENATE DIMEGLUMINE 529 MG/ML IV SOLN
10.0000 mL | Freq: Once | INTRAVENOUS | Status: AC | PRN
  Administered 2018-01-12: 10 mL via INTRAVENOUS

## 2018-01-14 ENCOUNTER — Encounter: Payer: Self-pay | Admitting: Internal Medicine

## 2018-01-21 DIAGNOSIS — S32010A Wedge compression fracture of first lumbar vertebra, initial encounter for closed fracture: Secondary | ICD-10-CM | POA: Diagnosis not present

## 2018-01-22 DIAGNOSIS — E119 Type 2 diabetes mellitus without complications: Secondary | ICD-10-CM | POA: Diagnosis not present

## 2018-01-28 ENCOUNTER — Ambulatory Visit (INDEPENDENT_AMBULATORY_CARE_PROVIDER_SITE_OTHER): Payer: Medicare Other | Admitting: Family Medicine

## 2018-01-28 ENCOUNTER — Other Ambulatory Visit (INDEPENDENT_AMBULATORY_CARE_PROVIDER_SITE_OTHER): Payer: Medicare Other | Admitting: *Deleted

## 2018-01-28 ENCOUNTER — Encounter: Payer: Self-pay | Admitting: Family Medicine

## 2018-01-28 ENCOUNTER — Other Ambulatory Visit: Payer: Self-pay

## 2018-01-28 DIAGNOSIS — N183 Chronic kidney disease, stage 3 unspecified: Secondary | ICD-10-CM

## 2018-01-28 DIAGNOSIS — M545 Low back pain, unspecified: Secondary | ICD-10-CM

## 2018-01-28 DIAGNOSIS — Z7901 Long term (current) use of anticoagulants: Secondary | ICD-10-CM

## 2018-01-28 DIAGNOSIS — M159 Polyosteoarthritis, unspecified: Secondary | ICD-10-CM | POA: Diagnosis not present

## 2018-01-28 LAB — POCT INR: INR: 3.2 — AB (ref 2.0–3.0)

## 2018-01-28 MED ORDER — GABAPENTIN 100 MG PO CAPS: 100.0000 mg | ORAL_CAPSULE | Freq: Three times a day (TID) | ORAL | 3 refills | Status: DC

## 2018-01-28 MED ORDER — PREDNISONE 20 MG PO TABS: 20.0000 mg | ORAL_TABLET | Freq: Two times a day (BID) | ORAL | 0 refills | Status: DC

## 2018-01-28 NOTE — Patient Instructions (Signed)
The prednisone is short term only for inflammation. The gabapentin is a nerve pain medicine that may be a good long term medicine for you.   Both are OK with the coumadin.

## 2018-01-29 ENCOUNTER — Other Ambulatory Visit: Payer: Self-pay

## 2018-01-29 ENCOUNTER — Encounter: Payer: Self-pay | Admitting: Family Medicine

## 2018-01-29 DIAGNOSIS — M545 Low back pain, unspecified: Secondary | ICD-10-CM | POA: Insufficient documentation

## 2018-01-29 DIAGNOSIS — M159 Polyosteoarthritis, unspecified: Secondary | ICD-10-CM

## 2018-01-29 MED ORDER — PREDNISONE 20 MG PO TABS: 20.0000 mg | ORAL_TABLET | Freq: Two times a day (BID) | ORAL | 0 refills | Status: DC

## 2018-01-29 NOTE — Telephone Encounter (Signed)
Pt called and stated that [facility] does not have her prednisone.  She would like these send to [facility] on [address].  Sent as requested and pt informed. Thecla Forgione, Salome Spotted, CMA

## 2018-01-29 NOTE — Assessment & Plan Note (Signed)
No NSAID.

## 2018-01-29 NOTE — Progress Notes (Signed)
   Subjective:    Patient ID: Erin Coffey, female    DOB: 1945/11/28, 73 y.o.   MRN: [id number]  HPI  Complicated story of acute on chronic left hip pain.  Patient has had severe left hip pain x 4 days.  Difficulty in ambulation.  Feels the leg will buckle on her at times.  No recent falls.  Has been having left sided chronic pain since a fall on ice 10/2016.  She has had considerable WU.  Has known osteoarthritis of left hip and lumbar spine plus degenerative disc disease of lumbar spine.  Reviewed thoracic spine films on 01/13/18.  Suspect old L1 compression fx is from fall of 18 months ago.  Also reviewed bone density which was good - this was not an osteoporotic compression fracture.  States does not have numbness or tingling.  She has muscle relaxer and tramadol which have not helped the acute pain.  She has CKD 3 with creat=1.4 plus on coumadin so avoid NSAIDs    Review of Systems     Objective:   Physical ExamAntalgic gait due to left leg pain.  Walks bent forward in lumbar region.  Pain on internal and external rotation of hip.  No numbness or gross weakness.        Assessment & Plan:

## 2018-01-29 NOTE — Assessment & Plan Note (Signed)
No NSAID

## 2018-01-29 NOTE — Assessment & Plan Note (Signed)
Burst of steroids.  Add gabapentin.  Response will determine when follow up with PCP.

## 2018-01-29 NOTE — Assessment & Plan Note (Signed)
Hip and/or facet arthritis likely playing a role.

## 2018-02-01 NOTE — Telephone Encounter (Signed)
Called pharmacy, patient picked up Rx on Saturday

## 2018-02-12 ENCOUNTER — Other Ambulatory Visit: Payer: Self-pay | Admitting: *Deleted

## 2018-02-12 ENCOUNTER — Ambulatory Visit (INDEPENDENT_AMBULATORY_CARE_PROVIDER_SITE_OTHER): Payer: Medicare Other | Admitting: *Deleted

## 2018-02-12 DIAGNOSIS — Z7901 Long term (current) use of anticoagulants: Secondary | ICD-10-CM | POA: Diagnosis not present

## 2018-02-12 LAB — POCT INR: INR: 3.3 — AB (ref 2.0–3.0)

## 2018-02-12 MED ORDER — LOSARTAN POTASSIUM 25 MG PO TABS: 25.0000 mg | ORAL_TABLET | Freq: Every day | ORAL | 3 refills | Status: DC

## 2018-02-25 ENCOUNTER — Other Ambulatory Visit: Payer: Self-pay

## 2018-02-25 ENCOUNTER — Ambulatory Visit (INDEPENDENT_AMBULATORY_CARE_PROVIDER_SITE_OTHER): Payer: Medicare Other | Admitting: *Deleted

## 2018-02-25 ENCOUNTER — Ambulatory Visit (INDEPENDENT_AMBULATORY_CARE_PROVIDER_SITE_OTHER): Payer: Medicare Other | Admitting: Family Medicine

## 2018-02-25 ENCOUNTER — Encounter: Payer: Self-pay | Admitting: Family Medicine

## 2018-02-25 VITALS — BP 132/60 | HR 71 | Temp 99.1°F | Ht 69.0 in | Wt 283.0 lb

## 2018-02-25 DIAGNOSIS — K529 Noninfective gastroenteritis and colitis, unspecified: Secondary | ICD-10-CM

## 2018-02-25 DIAGNOSIS — Z7901 Long term (current) use of anticoagulants: Secondary | ICD-10-CM | POA: Diagnosis not present

## 2018-02-25 DIAGNOSIS — I129 Hypertensive chronic kidney disease with stage 1 through stage 4 chronic kidney disease, or unspecified chronic kidney disease: Secondary | ICD-10-CM | POA: Diagnosis not present

## 2018-02-25 DIAGNOSIS — E1122 Type 2 diabetes mellitus with diabetic chronic kidney disease: Secondary | ICD-10-CM | POA: Diagnosis not present

## 2018-02-25 DIAGNOSIS — E785 Hyperlipidemia, unspecified: Secondary | ICD-10-CM | POA: Diagnosis not present

## 2018-02-25 DIAGNOSIS — D649 Anemia, unspecified: Secondary | ICD-10-CM | POA: Diagnosis not present

## 2018-02-25 DIAGNOSIS — R109 Unspecified abdominal pain: Secondary | ICD-10-CM | POA: Diagnosis not present

## 2018-02-25 DIAGNOSIS — N183 Chronic kidney disease, stage 3 (moderate): Secondary | ICD-10-CM | POA: Diagnosis not present

## 2018-02-25 LAB — POCT INR: INR: 2.7 (ref 2.0–3.0)

## 2018-02-25 NOTE — Patient Instructions (Signed)

## 2018-02-25 NOTE — Progress Notes (Signed)
    Subjective:  Erin Coffey is a 72 y.o. female who presents to the [facility] today with a chief complaint of nausea/diarrhea.   HPI:  Started on Monday night. Ate 2 jelly sandwiches with cocacola. Got chicken from [employer] and mashed potatoes with gravy and broccoli. Then had sudden onset nausea, had to make herself throw up to get rid of the food, afterwards had diarrhea. Also with abdominal pain central, cramping. Nausea/diarrhea is slowly getting better. Yesterday had 10+ episodes of diarrhea, today only had 1. Drinking gatorade and water, staying down well. Just feels very tired and drained.  Also has muscle cramping in her arms and legs. Took muscle relaxer baclofen which helped.    ROS: Per HPI  Objective:  Physical Exam: BP 132/60   Pulse 71   Temp 99.1 F (37.3 C) (Oral)   Ht 5\' 9"  (1.753 m)   Wt 283 lb (128.4 kg)   SpO2 99%   BMI 41.79 kg/m   Gen: NAD, resting comfortably CV: RRR with no murmurs appreciated Pulm: NWOB, CTAB with no crackles, wheezes, or rhonchi GI: Normal bowel sounds present. Soft, Nontender, Nondistended. MSK: no edema, cyanosis, or clubbing noted Skin: warm, dry Neuro: grossly normal, moves all extremities Psych: Normal affect and thought content   Assessment/Plan:  1. Noninfectious gastroenteritis, unspecified type Patient is well appearing, afebrile, with a benign abdominal exam. Likely a self-limited gastroenteritis that is resolving. Muscle aches/cramping were likely also from dehydration given significant amount of diarrhea over the last few days. Recommended continued supportive care at home with good po hydration. Given return precautions.   Bufford Lope, DO PGY-3, [facility] 02/25/2018 10:46 AM

## 2018-03-05 DIAGNOSIS — N183 Chronic kidney disease, stage 3 (moderate): Secondary | ICD-10-CM | POA: Diagnosis not present

## 2018-03-25 ENCOUNTER — Ambulatory Visit (INDEPENDENT_AMBULATORY_CARE_PROVIDER_SITE_OTHER): Payer: Medicare Other | Admitting: *Deleted

## 2018-03-25 DIAGNOSIS — Z7901 Long term (current) use of anticoagulants: Secondary | ICD-10-CM

## 2018-03-25 LAB — POCT INR: INR: 2.5 (ref 2.0–3.0)

## 2018-04-23 ENCOUNTER — Ambulatory Visit (INDEPENDENT_AMBULATORY_CARE_PROVIDER_SITE_OTHER): Payer: Medicare Other | Admitting: *Deleted

## 2018-04-23 ENCOUNTER — Ambulatory Visit: Payer: Medicare Other | Admitting: Family Medicine

## 2018-04-23 DIAGNOSIS — Z7901 Long term (current) use of anticoagulants: Secondary | ICD-10-CM | POA: Diagnosis not present

## 2018-04-23 LAB — POCT INR: INR: 2.5 (ref 2.0–3.0)

## 2018-05-21 ENCOUNTER — Ambulatory Visit (INDEPENDENT_AMBULATORY_CARE_PROVIDER_SITE_OTHER): Payer: Medicare Other | Admitting: *Deleted

## 2018-05-21 DIAGNOSIS — E119 Type 2 diabetes mellitus without complications: Secondary | ICD-10-CM

## 2018-05-21 DIAGNOSIS — Z7901 Long term (current) use of anticoagulants: Secondary | ICD-10-CM

## 2018-05-21 LAB — POCT GLYCOSYLATED HEMOGLOBIN (HGB A1C): HbA1c, POC (controlled diabetic range): 5.9 % (ref 0.0–7.0)

## 2018-05-21 LAB — POCT INR: INR: 3.4 — AB (ref 2.0–3.0)

## 2018-05-31 ENCOUNTER — Other Ambulatory Visit: Payer: Self-pay

## 2018-05-31 ENCOUNTER — Encounter: Payer: Self-pay | Admitting: Family Medicine

## 2018-05-31 ENCOUNTER — Ambulatory Visit (INDEPENDENT_AMBULATORY_CARE_PROVIDER_SITE_OTHER): Payer: Medicare Other | Admitting: Family Medicine

## 2018-05-31 VITALS — BP 150/74 | HR 75 | Temp 98.1°F | Wt 288.0 lb

## 2018-05-31 DIAGNOSIS — R062 Wheezing: Secondary | ICD-10-CM | POA: Insufficient documentation

## 2018-05-31 DIAGNOSIS — Z23 Encounter for immunization: Secondary | ICD-10-CM

## 2018-05-31 DIAGNOSIS — E119 Type 2 diabetes mellitus without complications: Secondary | ICD-10-CM

## 2018-05-31 DIAGNOSIS — L602 Onychogryphosis: Secondary | ICD-10-CM | POA: Insufficient documentation

## 2018-05-31 DIAGNOSIS — Z7901 Long term (current) use of anticoagulants: Secondary | ICD-10-CM | POA: Diagnosis not present

## 2018-05-31 DIAGNOSIS — B353 Tinea pedis: Secondary | ICD-10-CM | POA: Insufficient documentation

## 2018-05-31 LAB — POCT INR: INR: 2.7 (ref 2.0–3.0)

## 2018-05-31 MED ORDER — TERBINAFINE HCL 1 % EX CREA: 1.0000 "application " | TOPICAL_CREAM | Freq: Two times a day (BID) | CUTANEOUS | 0 refills | Status: DC

## 2018-05-31 MED ORDER — BACLOFEN 10 MG PO TABS: 10.0000 mg | ORAL_TABLET | Freq: Every day | ORAL | 0 refills | Status: DC | PRN

## 2018-05-31 NOTE — Assessment & Plan Note (Signed)
INR goal today.  Discussed risk of Coumadin and newer and safer option DOAC for anticoagulation.  Uninterested in transitioning at this time. -Monitor INR 2 to 4 weeks as per protocol

## 2018-05-31 NOTE — Assessment & Plan Note (Signed)
Right great to with deformed nail leading to improper footwear.  Given patient's diabetes and peripheral neuropathy.  Concern for risk of developing foot ulcer.  Will refer to podiatry for further assessment.

## 2018-05-31 NOTE — Assessment & Plan Note (Signed)
Assessment: 1.  Diabetes.  Well-controlled 2.  Blood Pressure.  Above goal today 3.  Lipids.  Not assessed in over a year. 4.  Foot Care.  Normal  Recommendations: 1.  Patient is counseled on appropriate foot care.  Referring to podiatry for further assessment. 2.  BP elevated today 150/70.  We will follow-up at next appointment 3.  Plan to recheck lipid panel today. 4.  Return to clinic in 4-6 wks

## 2018-05-31 NOTE — Patient Instructions (Signed)
It was a pleasure to see you today! Thank you for choosing [facility] for your primary care. Erin Coffey was seen for Diabetes Follow UP.   1. For your feet, we are giving you a cream for athelete's foot. I am also referring you to podiatry for toe nail evaluation and diabetic foot ware.   2. For you diabetes, we are getting labs to check your blood and cholesterol.   3. For your wheeze, we will monitor it for now. Follow up in clinic in about 1 month or sooner if needed.   Best,  Marny Lowenstein, MD, MS FAMILY MEDICINE RESIDENT - PGY2 05/31/2018 9:24 AM

## 2018-05-31 NOTE — Progress Notes (Addendum)
Subjective:  Erin Coffey is a 72 y.o. female who presents to the [facility] today with for INR check and to meet new PCP and discuss diabetes follow-up.   HPI:  Diabetes mellitus type 2 Well controlled with A1c less than 7.5%.  Not on any medications.  Denies any signs or symptoms of hypoglycemia.  Long-term anticoagulation for unprovoked multiple pulmonary emboli Patient currently controlled on warfarin.  INR goal.  Patient likes to be able to "check with going on with me" she is on warfarin.  Discussed risk benefits of transitioning to DOAC.  Patient not interested at this time.  Right slight burning and sores Patient gets right foot burning and sores.  This is long term problem.  Also gets very itchy.  She will sometimes apply a cream to this for itching.  She does not know the name of the cream.  It does sometimes help.  She does not know of any open sores or bad itching at this time.  Onychogryphosis Right second toenail large deformed, gets rubbed with poor fitting shoes.  Wheeze Patient gets wheeze in the mornings.  This is happened over the past month.  Patient has some cough in the morning. Has not been worsening.   Denies fevers and chills.  Denies shortness of breath or chest pain.  Improves throughout the day.  Denies history of any asthma or any respiratory disease.  Ambulatory pulse ox remained 99%.  Need for flu immunization Patient getting flu vaccine today.  ROS: Per HPI  PMH: No history of chronic respiratory illness such as asthma or COPD.   Objective:  Physical Exam: BP (!) 150/74   Pulse 75   Temp 98.1 F (36.7 C) (Oral)   Wt 288 lb (130.6 kg)   SpO2 99%   BMI 42.53 kg/m   Gen: NAD, resting comfortably CV: RRR with no murmurs appreciated.  No JVD. Pulm: NWOB, CTAB with no crackles, wheezes, or rhonchi GI: Normal bowel sounds present. Soft, Nontender, Nondistended. MSK: no edema, cyanosis, or clubbing noted Skin: warm, dry Neuro: grossly normal,  moves all extremities Psych: Normal affect and thought content  Results for orders placed or performed in visit on 05/31/18 (from the past 72 hour(s))  POCT INR     Status: None   Collection Time: 05/31/18  8:58 AM  Result Value Ref Range   INR 2.7 2.0 - 3.0   Diabetic Foot Exam - Simple   Simple Foot Form Diabetic Foot exam was performed with the following findings:  Yes 05/31/2018  9:35 AM  Visual Inspection No deformities, no ulcerations, no other skin breakdown bilaterally:  Yes Sensation Testing Intact to touch and monofilament testing bilaterally:  Yes Pulse Check Posterior Tibialis and Dorsalis pulse intact bilaterally:  Yes Comments      Assessment/Plan:  Type 2 diabetes mellitus with hemoglobin A1c goal of less than 7.5% (HCC) Assessment: 1.  Diabetes.  Well-controlled 2.  Blood Pressure.  Above goal today 3.  Lipids.  Not assessed in over a year. 4.  Foot Care.  Normal  Recommendations: 1.  Patient is counseled on appropriate foot care.  Referring to podiatry for further assessment. 2.  BP elevated today 150/70.  We will follow-up at next appointment 3.  Plan to recheck lipid panel today. 4.  Return to clinic in 4-6 wks   Onychogryphosis Right great to with deformed nail leading to improper footwear.  Given patient's diabetes and peripheral neuropathy.  Concern for risk of developing foot  ulcer.  Will refer to podiatry for further assessment.  Tinea pedis of right foot Scaling and peeling between fourth and fifth toes on right foot.  No open ulcers or wounds.  Given history of itching and turning pain, likely tinea pedis. -Trial Lotrimin  Long term (current) use of anticoagulants INR goal today.  Discussed risk of Coumadin and newer and safer option DOAC for anticoagulation.  Uninterested in transitioning at this time. -Monitor INR 2 to 4 weeks as per protocol  Wheeze Patient complaining of chest pain wheeze in the a.m. for the past month.  Associated  cough.  Not associated with URI symptoms, chest pain, shortness of breath.  Patient therapeutic INR so PE is not suspected at this time.  Cardiorespiratory exam unremarkable.  No signs of wheeze today.  Amatory pulse ox normal.  Unsure of exact etiology at this time.  No reason to start intervention today.   - Will monitor clinically.  Patient is agreeable to this plan.  - Return precautions given. - Patient follow-up in 1 month to reassess.    Lab Orders     CBC with Differential     Lipid Panel     POCT INR  Meds ordered this encounter  Medications  . terbinafine (LAMISIL) 1 % cream    Sig: Apply 1 application topically 2 (two) times daily.    Dispense:  30 g    Refill:  0  . baclofen (LIORESAL) 10 MG tablet    Sig: Take 1 tablet (10 mg total) by mouth daily as needed for muscle spasms.    Dispense:  30 each    Refill:  0      Marny Lowenstein, MD, MS FAMILY MEDICINE RESIDENT - PGY2 05/31/2018 9:50 AM

## 2018-05-31 NOTE — Assessment & Plan Note (Signed)
Patient complaining of chest pain wheeze in the a.m. for the past month.  Associated cough.  Not associated with URI symptoms, chest pain, shortness of breath.  Patient therapeutic INR so PE is not suspected at this time.  Cardiorespiratory exam unremarkable.  No signs of wheeze today.  Amatory pulse ox normal.  Unsure of exact etiology at this time.  No reason to start intervention today.   - Will monitor clinically.  Patient is agreeable to this plan.  - Return precautions given. - Patient follow-up in 1 month to reassess.

## 2018-05-31 NOTE — Assessment & Plan Note (Signed)
Scaling and peeling between fourth and fifth toes on right foot.  No open ulcers or wounds.  Given history of itching and turning pain, likely tinea pedis. -Trial Lotrimin

## 2018-06-01 LAB — LIPID PANEL
Chol/HDL Ratio: 2 ratio (ref 0.0–4.4)
Cholesterol, Total: 163 mg/dL (ref 100–199)
HDL: 81 mg/dL (ref >39)
LDL Calculated: 73 mg/dL (ref 0–99)
Triglycerides: 43 mg/dL (ref 0–149)
VLDL Cholesterol Cal: 9 mg/dL (ref 5–40)

## 2018-06-01 LAB — CBC WITH DIFFERENTIAL/PLATELET
Basophils Absolute: 0 10*3/uL (ref 0.0–0.2)
Basos: 1 %
EOS (ABSOLUTE): 0.1 10*3/uL (ref 0.0–0.4)
Eos: 4 %
Hematocrit: 33.1 % — ABNORMAL LOW (ref 34.0–46.6)
Hemoglobin: 11 g/dL — ABNORMAL LOW (ref 11.1–15.9)
Immature Grans (Abs): 0 10*3/uL (ref 0.0–0.1)
Immature Granulocytes: 0 %
Lymphocytes Absolute: 1.5 10*3/uL (ref 0.7–3.1)
Lymphs: 39 %
MCH: 29.2 pg (ref 26.6–33.0)
MCHC: 33.2 g/dL (ref 31.5–35.7)
MCV: 88 fL (ref 79–97)
Monocytes Absolute: 0.5 10*3/uL (ref 0.1–0.9)
Monocytes: 13 %
Neutrophils Absolute: 1.6 10*3/uL (ref 1.4–7.0)
Neutrophils: 43 %
Platelets: 184 10*3/uL (ref 150–450)
RBC: 3.77 x10E6/uL (ref 3.77–5.28)
RDW: 14 % (ref 12.3–15.4)
WBC: 3.7 10*3/uL (ref 3.4–10.8)

## 2018-06-03 ENCOUNTER — Encounter: Payer: Self-pay | Admitting: Family Medicine

## 2018-06-21 ENCOUNTER — Ambulatory Visit: Payer: Medicare Other | Admitting: Podiatry

## 2018-06-21 ENCOUNTER — Ambulatory Visit (INDEPENDENT_AMBULATORY_CARE_PROVIDER_SITE_OTHER): Payer: Medicare Other

## 2018-06-21 ENCOUNTER — Encounter: Payer: Self-pay | Admitting: Podiatry

## 2018-06-21 ENCOUNTER — Other Ambulatory Visit: Payer: Self-pay | Admitting: Podiatry

## 2018-06-21 VITALS — BP 153/78 | HR 66

## 2018-06-21 DIAGNOSIS — M79675 Pain in left toe(s): Secondary | ICD-10-CM

## 2018-06-21 DIAGNOSIS — M79674 Pain in right toe(s): Secondary | ICD-10-CM

## 2018-06-21 DIAGNOSIS — M2041 Other hammer toe(s) (acquired), right foot: Secondary | ICD-10-CM

## 2018-06-21 DIAGNOSIS — M79671 Pain in right foot: Secondary | ICD-10-CM

## 2018-06-21 DIAGNOSIS — B351 Tinea unguium: Secondary | ICD-10-CM | POA: Diagnosis not present

## 2018-06-21 NOTE — Progress Notes (Signed)
Subjective:   Patient ID: Erin Coffey, female   DOB: 72 y.o.   MRN: [id number]   HPI Patient presents with chronic nail disease 1-5 both feet and also complains of the right big toenail being quite draining and at times more painful than the adjacent nails.  All nails are thickened and she cannot cut them and patient does not smoke likes to be active and has diabetes under reasonably good control   Review of Systems  All other systems reviewed and are negative.       Objective:  Physical Exam  Constitutional: She appears well-developed and well-nourished.  Cardiovascular: Intact distal pulses.  Pulmonary/Chest: Effort normal.  Musculoskeletal: Normal range of motion.  Neurological: She is alert.  Skin: Skin is warm.  Nursing note and vitals reviewed.   Neurovascular status was found to be intact with muscle strength within normal limits.  Patient is found to have thickened dystrophic nailbeds 1-5 both feet with incurvation of a more significant nature with looseness of the nailbed right big toe with pain upon palpation.  Patient does have good digital perfusion and is well oriented x3     Assessment:  Chronic nail disease with pain 1-5 both feet with damage right hallux nail with thickness of the nailbed noted     Plan:  H&P conditions reviewed and discussed ultimately the nail removal right will probably be necessary.  She wants to get this done but cannot do it today and at this point I did debride nailbeds 1-5 both feet carefully taking out the corners and referred her for routine nail care.  I gave instructions on what to do if any issues were to occur and gave her instructions on good diabetic foot care  X-ray right did indicate she is got moderate arthritis of the metatarsal phalangeal joint but no indications of stress fracture or other indications of pathology

## 2018-06-21 NOTE — Patient Instructions (Signed)

## 2018-06-29 ENCOUNTER — Ambulatory Visit: Payer: Medicare Other | Admitting: Family Medicine

## 2018-07-02 ENCOUNTER — Ambulatory Visit: Payer: Medicare Other

## 2018-07-02 ENCOUNTER — Ambulatory Visit: Payer: Medicare Other | Admitting: *Deleted

## 2018-07-02 DIAGNOSIS — Z7901 Long term (current) use of anticoagulants: Secondary | ICD-10-CM

## 2018-07-02 LAB — POCT INR: INR: 3 (ref 2.0–3.0)

## 2018-07-09 ENCOUNTER — Other Ambulatory Visit: Payer: Self-pay | Admitting: Family Medicine

## 2018-07-15 ENCOUNTER — Other Ambulatory Visit: Payer: Self-pay | Admitting: Orthopedic Surgery

## 2018-07-15 DIAGNOSIS — M545 Low back pain, unspecified: Secondary | ICD-10-CM

## 2018-07-15 DIAGNOSIS — M25552 Pain in left hip: Secondary | ICD-10-CM

## 2018-07-15 DIAGNOSIS — M5416 Radiculopathy, lumbar region: Secondary | ICD-10-CM

## 2018-07-19 ENCOUNTER — Telehealth: Payer: Self-pay | Admitting: *Deleted

## 2018-07-19 NOTE — Telephone Encounter (Signed)
Pt calls because her left leg/foot has been swelling for the past 2 days.  Denies SOB or redness/warmth of leg  Given her history of blood clots ran by Dr. Nori Riis.  She is on warfarin and her levels have been therapeutic.   Pt is agreeable to coming for an INR check tomorrow.  Per Dr. Nori Riis when she comes in, if a doppler is needed we can order and schedule under her name.  Fle[place], Salome Spotted, CMA

## 2018-07-20 ENCOUNTER — Ambulatory Visit (INDEPENDENT_AMBULATORY_CARE_PROVIDER_SITE_OTHER): Payer: Medicare Other | Admitting: Family Medicine

## 2018-07-20 ENCOUNTER — Ambulatory Visit (INDEPENDENT_AMBULATORY_CARE_PROVIDER_SITE_OTHER): Payer: Medicare Other | Admitting: *Deleted

## 2018-07-20 ENCOUNTER — Encounter: Payer: Self-pay | Admitting: Family Medicine

## 2018-07-20 ENCOUNTER — Other Ambulatory Visit: Payer: Self-pay

## 2018-07-20 VITALS — BP 150/78 | HR 57 | Temp 98.6°F | Wt 288.4 lb

## 2018-07-20 DIAGNOSIS — M7989 Other specified soft tissue disorders: Secondary | ICD-10-CM | POA: Diagnosis not present

## 2018-07-20 DIAGNOSIS — R200 Anesthesia of skin: Secondary | ICD-10-CM

## 2018-07-20 DIAGNOSIS — Z7901 Long term (current) use of anticoagulants: Secondary | ICD-10-CM | POA: Diagnosis not present

## 2018-07-20 LAB — POCT INR: INR: 2.6 (ref 2.0–3.0)
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
## 2018-07-20 NOTE — Patient Instructions (Addendum)
Thank you for coming to see me today. It was a pleasure! Today we talked about:   Your leg swelling. Please go tot he ED if your experience any vision changes, changes in speech or weakness in one limb that is new.   Please follow up next week for a blood pressure check here by one our our nurses.   If you have any questions or concerns, please do not hesitate to call the office at [phone].  Take Care,   [name] Emma Birchler, DO  Stroke Prevention Some health problems and behaviors may make it more likely for you to have a stroke. Below are ways to lessen your risk of having a stroke.  Be active for at least 30 minutes on most or all days.  Do not smoke. Try not to be around others who smoke.  Do not drink too much alcohol. ? Do not have more than 2 drinks a day if you are a man. ? Do not have more than 1 drink a day if you are a woman and are not pregnant.  Eat healthy foods, such as fruits and vegetables. If you were put on a specific diet, follow the diet as told.  Keep your cholesterol levels under control through diet and medicines. Look for foods that are low in saturated fat, trans fat, cholesterol, and are high in fiber.  If you have diabetes, follow all diet plans and take your medicine as told.  Ask your doctor if you need treatment to lower your blood pressure. If you have high blood pressure (hypertension), follow all diet plans and take your medicine as told by your doctor.  If you are 63-78 years old, have your blood pressure checked every 3-5 years. If you are age 14 or older, have your blood pressure checked every year.  Keep a healthy weight. Eat foods that are low in calories, salt, saturated fat, trans fat, and cholesterol.  Do not take drugs.  Avoid birth control pills, if this applies. Talk to your doctor about the risks of taking birth control pills.  Talk to your doctor if you have sleep problems (sleep apnea).  Take all medicine as told by your  doctor. ? You may be told to take aspirin or blood thinner medicine. Take this medicine as told by your doctor. ? Understand your medicine instructions.  Make sure any other conditions you have are being taken care of.  Get help right away if:  You suddenly lose feeling (you feel numb) or have weakness in your face, arm, or leg.  Your face or eyelid hangs down to one side.  You suddenly feel confused.  You have trouble talking (aphasia) or understanding what people are saying.  You suddenly have trouble seeing in one or both eyes.  You suddenly have trouble walking.  You are dizzy.  You lose your balance or your movements are clumsy (uncoordinated).  You suddenly have a very bad headache and you do not know the cause.  You have new chest pain.  Your heart feels like it is fluttering or skipping a beat (irregular heartbeat). Do not wait to see if the symptoms above go away. Get help right away. Call your local emergency services (911 in [place] Do not drive yourself to the hospital. This information is not intended to replace advice given to you by your health care provider. Make sure you discuss any questions you have with your health care provider. Document Released: 01/27/2012 Document Revised: 01/03/2016 Document Reviewed:  01/28/2013 Elsevier Interactive Patient Education  [employer].

## 2018-07-21 ENCOUNTER — Ambulatory Visit: Payer: Medicare Other

## 2018-07-22 DIAGNOSIS — R200 Anesthesia of skin: Secondary | ICD-10-CM | POA: Insufficient documentation

## 2018-07-22 DIAGNOSIS — M7989 Other specified soft tissue disorders: Secondary | ICD-10-CM | POA: Insufficient documentation

## 2018-07-22 NOTE — Assessment & Plan Note (Signed)
Patient seen for INR check today and noted to have some leg swelling. Patient with no pitting edema on exam and reports that this is not a new problem. Mild nonpitting edema on exam. Patient denies any CP or SOB. BP slightly elevated, and will return for a nurse visit for BP check by next week. Patient overall well-appearing and lungs CTAB with O2 sat of 99%. Patient will follow up with PCP if swelling continues or gets worse.

## 2018-07-22 NOTE — Assessment & Plan Note (Signed)
Reports some numbness in L arm and L leg. Patient reports it started Sunday and has been stable. She thinks it is related to her bursitis in her L shoulder and chronic leg pain. She is not concerned with a stroke. She has had no focal weakness or speech change. Patient counseled extensively on signs of a stroke and encouraged to go to the ED if she notices any signs. She does have high BP and will follow up for a nurse visit and her PCP.

## 2018-07-27 ENCOUNTER — Ambulatory Visit: Payer: Medicare Other | Admitting: *Deleted

## 2018-07-27 DIAGNOSIS — I10 Essential (primary) hypertension: Secondary | ICD-10-CM

## 2018-07-27 MED ORDER — METOPROLOL TARTRATE 25 MG PO TABS: 12.5000 mg | ORAL_TABLET | Freq: Two times a day (BID) | ORAL | 0 refills | Status: DC

## 2018-07-27 NOTE — Progress Notes (Signed)
Pt is here for a BP check today.  Her BP on 07/20/18 was 150/78  Today: 142/70 and is asymptomiatic.  She is currently on Amlodipine 5 mg, losartan 25 mg and metoprolol 25 mg.  She is currently out of metoprolol (she ran out last night).  One refill sent into pharmacy.    Next appt with PCP is 08/17/17.  Kairo Laubacher, Salome Spotted, CMA

## 2018-07-30 ENCOUNTER — Ambulatory Visit: Payer: Medicare Other

## 2018-07-31 ENCOUNTER — Other Ambulatory Visit: Payer: Self-pay | Admitting: Internal Medicine

## 2018-07-31 ENCOUNTER — Ambulatory Visit
Admission: RE | Admit: 2018-07-31 | Discharge: 2018-07-31 | Disposition: A | Discharge status: Home / Self Care | Payer: Medicare Other | Source: Ambulatory Visit | Attending: Orthopedic Surgery | Admitting: Orthopedic Surgery

## 2018-07-31 DIAGNOSIS — M160 Bilateral primary osteoarthritis of hip: Secondary | ICD-10-CM | POA: Diagnosis not present

## 2018-07-31 DIAGNOSIS — M25552 Pain in left hip: Secondary | ICD-10-CM

## 2018-07-31 DIAGNOSIS — M545 Low back pain, unspecified: Secondary | ICD-10-CM

## 2018-07-31 DIAGNOSIS — M5416 Radiculopathy, lumbar region: Secondary | ICD-10-CM

## 2018-07-31 DIAGNOSIS — M48061 Spinal stenosis, lumbar region without neurogenic claudication: Secondary | ICD-10-CM | POA: Diagnosis not present

## 2018-08-05 DIAGNOSIS — M25552 Pain in left hip: Secondary | ICD-10-CM | POA: Diagnosis not present

## 2018-08-16 NOTE — Progress Notes (Signed)
Subjective:   Patient ID: Erin Coffey    DOB: Apr 14, 1946, 73 y.o. female   MRN: [id number]  Erin Coffey is a 73 y.o. female with a history of HTN, GERD, diverticulosis, T2DM, OA, osteopenia, CKD, HLD, obesity here for   Hypertension: - Medications: amlodipine 5mg , losartan 25mg , metoprolol 25mg  - Compliance: good - Checking BP at home: yes, 120s typically, occasionally will be in the 150s SBP. - Denies any SOB, CP, vision changes, medication SEs, or symptoms of hypotension - Diet: eats leafy vegetables 3 times per week. No breads, potatoes, starches.  - Exercise: walks a lot at church 6 days per church  L Leg numbness - has h/o chronic L hip radicular pain. CT lumbar spine and L hip 07/2018 with L foraminal stenosis at L4-L5 with possible compression of L4 nerve. Moderate degenerative chondral thinning. - endorses swelling, numbness, pain along L leg for the past few weeks. States she can feel her leg "puffing up" when she walks - been going on for about a month - no redness appreciated. - feels pain at rest and when walking. Helps to prop her L thigh up. - cramp in character, starts in outside of L hip area and goes down leg - no fevers - h/o PE in 2012, chronically on coumadin. No missed doses. - previously taken gabapentin, not taken in a few weeks.  - uses tramadol sparingly, helps with pain  Review of Systems:  Per HPI.  [place], medications and smoking status reviewed.  Objective:   BP (!) 148/70   Pulse 80   Temp 99 F (37.2 C) (Oral)   Wt 292 lb 6 oz (132.6 kg)   SpO2 98%   BMI 43.18 kg/m  Vitals and nursing note reviewed.  General: morbidly obese female, in no acute distress with non-toxic appearance CV: regular rate and rhythm without murmurs, rubs, or gallops, no lower extremity edema. Lungs: clear to auscultation bilaterally with normal work of breathing Skin: warm, dry, no rashes or lesions Extremities: warm and well perfused MSK: ROM grossly  intact, 5/5 strength in RLE, chronic 4/5 strength LLE. LE not tender to palpation. Gait normal. Neuro: Alert and oriented, speech normal. No sensory deficits in LE.  Assessment & Plan:   HYPERTENSION, BENIGN ESSENTIAL 148/70 today, reports took meds today. Given reports of 120s SBP at home, will not adjust medication today. Instructed to keep log of BP and f/u with PCP in one month for potential medication adjustment.  Chronic left hip pain Contributing to L leg radicular symptoms as described above. Instructed to take gabapentin for nerve pain. Low utility for prednisone at this point given chronicity (months-years). Discussed referral to neurosurgery given foraminal stenosis on MRI although patient declined and likelihood of candidacy for surgery is low given morbid obesity. Discussed weight loss through exercise.   OBESITY, NOS Likely contributing to chronic diseases (HTN, DM2) and back/hip pain associated with radicular sx as above. Advised weight loss through diet and exercise. Has to watch leafy green vegetable intake as is on coumadin.  Orders Placed This Encounter  Procedures  . POCT INR    This back office order was created through the Results Console.   Meds ordered this encounter  Medications  . warfarin (COUMADIN) 5 MG tablet    Sig: TAKE AS DIRECTED FOR 30 DAYS    Dispense:  45 tablet    Refill:  0  . polyethylene glycol powder (GLYCOLAX/MIRALAX) powder    Sig: Take 17 g by mouth 2 (  two) times daily as needed.    Dispense:  3350 g    Refill:  1  . triamcinolone cream (KENALOG) 0.1 %    Sig: apply to affected area twice a day    Dispense:  30 g    Refill:  [place], DO PGY-2, [facility] 08/17/2018 4:30 PM

## 2018-08-17 ENCOUNTER — Other Ambulatory Visit: Payer: Self-pay

## 2018-08-17 ENCOUNTER — Ambulatory Visit (INDEPENDENT_AMBULATORY_CARE_PROVIDER_SITE_OTHER): Payer: Medicare Other | Admitting: Family Medicine

## 2018-08-17 ENCOUNTER — Ambulatory Visit: Payer: Medicare Other | Admitting: Family Medicine

## 2018-08-17 VITALS — BP 148/70 | HR 80 | Temp 99.0°F | Wt 292.4 lb

## 2018-08-17 DIAGNOSIS — R109 Unspecified abdominal pain: Secondary | ICD-10-CM | POA: Diagnosis not present

## 2018-08-17 DIAGNOSIS — N898 Other specified noninflammatory disorders of vagina: Secondary | ICD-10-CM

## 2018-08-17 DIAGNOSIS — M25552 Pain in left hip: Secondary | ICD-10-CM | POA: Diagnosis not present

## 2018-08-17 DIAGNOSIS — Z6841 Body Mass Index (BMI) 40.0 and over, adult: Secondary | ICD-10-CM

## 2018-08-17 DIAGNOSIS — I10 Essential (primary) hypertension: Secondary | ICD-10-CM | POA: Diagnosis not present

## 2018-08-17 DIAGNOSIS — G8929 Other chronic pain: Secondary | ICD-10-CM | POA: Insufficient documentation

## 2018-08-17 DIAGNOSIS — Z7901 Long term (current) use of anticoagulants: Secondary | ICD-10-CM

## 2018-08-17 LAB — POCT INR: INR: 2.6 (ref 2.0–3.0)

## 2018-08-17 MED ORDER — POLYETHYLENE GLYCOL 3350 17 GM/SCOOP PO POWD: 17.0000 g | Freq: Two times a day (BID) | ORAL | 1 refills | Status: DC | PRN

## 2018-08-17 MED ORDER — WARFARIN SODIUM 5 MG PO TABS: ORAL_TABLET | ORAL | 0 refills | Status: DC

## 2018-08-17 MED ORDER — TRIAMCINOLONE ACETONIDE 0.1 % EX CREA: TOPICAL_CREAM | CUTANEOUS | 2 refills | Status: DC

## 2018-08-17 NOTE — Assessment & Plan Note (Signed)
Likely contributing to chronic diseases (HTN, DM2) and back/hip pain associated with radicular sx as above. Advised weight loss through diet and exercise. Has to watch leafy green vegetable intake as is on coumadin.

## 2018-08-17 NOTE — Patient Instructions (Addendum)
It was great to see you!  Our plans for today:  - Start taking your gabapentin again, this will help your pain. I do not think you have a blood clot in your leg.  - Keep a record of your blood pressures and follow up with Dr. Grandville Silos in one month. He may adjust your medications at that visit.  Take care and seek immediate care sooner if you develop any concerns.   Dr. [facility]

## 2018-08-17 NOTE — Assessment & Plan Note (Signed)
Contributing to L leg radicular symptoms as described above. Instructed to take gabapentin for nerve pain. Low utility for prednisone at this point given chronicity (months-years). Discussed referral to neurosurgery given foraminal stenosis on MRI although patient declined and likelihood of candidacy for surgery is low given morbid obesity. Discussed weight loss through exercise.

## 2018-08-17 NOTE — Assessment & Plan Note (Signed)
148/70 today, reports took meds today. Given reports of 120s SBP at home, will not adjust medication today. Instructed to keep log of BP and f/u with PCP in one month for potential medication adjustment.

## 2018-08-19 ENCOUNTER — Ambulatory Visit: Payer: Medicare Other

## 2018-09-02 ENCOUNTER — Ambulatory Visit: Payer: Medicare Other | Admitting: Podiatry

## 2018-09-02 DIAGNOSIS — N183 Chronic kidney disease, stage 3 (moderate): Secondary | ICD-10-CM | POA: Diagnosis not present

## 2018-09-02 DIAGNOSIS — E785 Hyperlipidemia, unspecified: Secondary | ICD-10-CM | POA: Diagnosis not present

## 2018-09-02 DIAGNOSIS — D649 Anemia, unspecified: Secondary | ICD-10-CM | POA: Diagnosis not present

## 2018-09-02 DIAGNOSIS — E1122 Type 2 diabetes mellitus with diabetic chronic kidney disease: Secondary | ICD-10-CM | POA: Diagnosis not present

## 2018-09-02 DIAGNOSIS — I129 Hypertensive chronic kidney disease with stage 1 through stage 4 chronic kidney disease, or unspecified chronic kidney disease: Secondary | ICD-10-CM | POA: Diagnosis not present

## 2018-09-16 ENCOUNTER — Ambulatory Visit (INDEPENDENT_AMBULATORY_CARE_PROVIDER_SITE_OTHER): Payer: Medicare Other | Admitting: *Deleted

## 2018-09-16 DIAGNOSIS — Z7901 Long term (current) use of anticoagulants: Secondary | ICD-10-CM

## 2018-09-16 LAB — POCT INR: INR: 2.9 (ref 2.0–3.0)

## 2018-10-14 ENCOUNTER — Other Ambulatory Visit: Payer: Self-pay | Admitting: *Deleted

## 2018-10-14 ENCOUNTER — Ambulatory Visit (INDEPENDENT_AMBULATORY_CARE_PROVIDER_SITE_OTHER): Payer: Medicare Other | Admitting: *Deleted

## 2018-10-14 DIAGNOSIS — Z7901 Long term (current) use of anticoagulants: Secondary | ICD-10-CM | POA: Diagnosis not present

## 2018-10-14 LAB — POCT INR: INR: 2.2 (ref 2.0–3.0)

## 2018-10-18 ENCOUNTER — Other Ambulatory Visit: Payer: Self-pay | Admitting: Family Medicine

## 2018-10-18 ENCOUNTER — Other Ambulatory Visit: Payer: Self-pay | Admitting: *Deleted

## 2018-10-18 DIAGNOSIS — Z7901 Long term (current) use of anticoagulants: Secondary | ICD-10-CM

## 2018-10-20 MED ORDER — ATORVASTATIN CALCIUM 40 MG PO TABS: 40.0000 mg | ORAL_TABLET | Freq: Every day | ORAL | 3 refills | Status: DC

## 2018-10-22 ENCOUNTER — Other Ambulatory Visit: Payer: Self-pay

## 2018-10-22 MED ORDER — AMLODIPINE BESYLATE 5 MG PO TABS: 5.0000 mg | ORAL_TABLET | Freq: Every day | ORAL | 3 refills | Status: DC

## 2018-11-11 ENCOUNTER — Ambulatory Visit (INDEPENDENT_AMBULATORY_CARE_PROVIDER_SITE_OTHER): Payer: Medicare Other | Admitting: *Deleted

## 2018-11-11 ENCOUNTER — Encounter: Payer: Self-pay | Admitting: Family Medicine

## 2018-11-11 ENCOUNTER — Other Ambulatory Visit: Payer: Self-pay | Admitting: Family Medicine

## 2018-11-11 ENCOUNTER — Other Ambulatory Visit: Payer: Self-pay

## 2018-11-11 DIAGNOSIS — E119 Type 2 diabetes mellitus without complications: Secondary | ICD-10-CM | POA: Diagnosis not present

## 2018-11-11 DIAGNOSIS — Z7901 Long term (current) use of anticoagulants: Secondary | ICD-10-CM | POA: Diagnosis not present

## 2018-11-11 LAB — POCT INR: INR: 2.4 (ref 2.0–3.0)

## 2018-11-11 LAB — POCT GLYCOSYLATED HEMOGLOBIN (HGB A1C): Hemoglobin A1C: 6.1 % — AB (ref 4.0–5.6)

## 2018-11-11 MED ORDER — WARFARIN SODIUM 5 MG PO TABS: ORAL_TABLET | ORAL | 0 refills | Status: DC

## 2018-11-30 ENCOUNTER — Ambulatory Visit: Payer: Medicare Other | Admitting: Podiatry

## 2018-11-30 ENCOUNTER — Encounter: Payer: Self-pay | Admitting: Podiatry

## 2018-11-30 ENCOUNTER — Other Ambulatory Visit: Payer: Self-pay

## 2018-11-30 VITALS — Temp 97.3°F

## 2018-11-30 DIAGNOSIS — E119 Type 2 diabetes mellitus without complications: Secondary | ICD-10-CM

## 2018-11-30 DIAGNOSIS — B351 Tinea unguium: Secondary | ICD-10-CM | POA: Diagnosis not present

## 2018-11-30 DIAGNOSIS — M79674 Pain in right toe(s): Secondary | ICD-10-CM | POA: Diagnosis not present

## 2018-11-30 DIAGNOSIS — M79675 Pain in left toe(s): Secondary | ICD-10-CM | POA: Diagnosis not present

## 2018-11-30 NOTE — Patient Instructions (Signed)
Diabetes Mellitus and Foot Care  Foot care is an important part of your health, especially when you have diabetes. Diabetes may cause you to have problems because of poor blood flow (circulation) to your feet and legs, which can cause your skin to:   Become thinner and drier.   Break more easily.   Heal more slowly.   Peel and crack.  You may also have nerve damage (neuropathy) in your legs and feet, causing decreased feeling in them. This means that you may not notice minor injuries to your feet that could lead to more serious problems. Noticing and addressing any potential problems early is the best way to prevent future foot problems.  How to care for your feet  Foot hygiene   Wash your feet daily with warm water and mild soap. Do not use hot water. Then, pat your feet and the areas between your toes until they are completely dry. Do not soak your feet as this can dry your skin.   Trim your toenails straight across. Do not dig under them or around the cuticle. File the edges of your nails with an emery board or nail file.   Apply a moisturizing lotion or petroleum jelly to the skin on your feet and to dry, brittle toenails. Use lotion that does not contain alcohol and is unscented. Do not apply lotion between your toes.  Shoes and socks   Wear clean socks or stockings every day. Make sure they are not too tight. Do not wear knee-high stockings since they may decrease blood flow to your legs.   Wear shoes that fit properly and have enough cushioning. Always look in your shoes before you put them on to be sure there are no objects inside.   To break in new shoes, wear them for just a few hours a day. This prevents injuries on your feet.  Wounds, scrapes, corns, and calluses   Check your feet daily for blisters, cuts, bruises, sores, and redness. If you cannot see the bottom of your feet, use a mirror or ask someone for help.   Do not cut corns or calluses or try to remove them with medicine.   If you  find a minor scrape, cut, or break in the skin on your feet, keep it and the skin around it clean and dry. You may clean these areas with mild soap and water. Do not clean the area with peroxide, alcohol, or iodine.   If you have a wound, scrape, corn, or callus on your foot, look at it several times a day to make sure it is healing and not infected. Check for:  ? Redness, swelling, or pain.  ? Fluid or blood.  ? Warmth.  ? Pus or a bad smell.  General instructions   Do not cross your legs. This may decrease blood flow to your feet.   Do not use heating pads or hot water bottles on your feet. They may burn your skin. If you have lost feeling in your feet or legs, you may not know this is happening until it is too late.   Protect your feet from hot and cold by wearing shoes, such as at the beach or on hot pavement.   Schedule a complete foot exam at least once a year (annually) or more often if you have foot problems. If you have foot problems, report any cuts, sores, or bruises to your health care provider immediately.  Contact a health care provider if:     You have a medical condition that increases your risk of infection and you have any cuts, sores, or bruises on your feet.   You have an injury that is not healing.   You have redness on your legs or feet.   You feel burning or tingling in your legs or feet.   You have pain or cramps in your legs and feet.   Your legs or feet are numb.   Your feet always feel cold.   You have pain around a toenail.  Get help right away if:   You have a wound, scrape, corn, or callus on your foot and:  ? You have pain, swelling, or redness that gets worse.  ? You have fluid or blood coming from the wound, scrape, corn, or callus.  ? Your wound, scrape, corn, or callus feels warm to the touch.  ? You have pus or a bad smell coming from the wound, scrape, corn, or callus.  ? You have a fever.  ? You have a red line going up your leg.  Summary   Check your feet every day  for cuts, sores, red spots, swelling, and blisters.   Moisturize feet and legs daily.   Wear shoes that fit properly and have enough cushioning.   If you have foot problems, report any cuts, sores, or bruises to your health care provider immediately.   Schedule a complete foot exam at least once a year (annually) or more often if you have foot problems.  This information is not intended to replace advice given to you by your health care provider. Make sure you discuss any questions you have with your health care provider.  Document Released: 07/25/2000 Document Revised: 09/09/2017 Document Reviewed: 08/29/2016  Elsevier Interactive Patient Education  2019 [employer] Inc[employer]

## 2018-11-30 NOTE — Progress Notes (Signed)
Subjective: Erin Coffey presents for preventative diabetic foot care today with painful, thick toenails 1-5 b/l that she cannot cut and which interfere with daily activities.  Pain is aggravated when wearing enclosed shoe gear and relieved with periodic professional debridement.   Erin Coffey is also on blood thinner, Coumadin.   Bonnita Hollow, MD is her PCP and last visit was 05/31/2018.   Current Outpatient Medications:  .  amLODipine (NORVASC) 5 MG tablet, Take 1 tablet (5 mg total) by mouth daily., Disp: 90 tablet, Rfl: 3 .  atorvastatin (LIPITOR) 40 MG tablet, Take 1 tablet (40 mg total) by mouth daily., Disp: 90 tablet, Rfl: 3 .  baclofen (LIORESAL) 10 MG tablet, Take 1 tablet (10 mg total) by mouth daily as needed for muscle spasms., Disp: 30 each, Rfl: 0 .  Blood Glucose Monitoring Suppl (RELION CONFIRM GLUCOSE MONITOR) w/Device KIT, Please provide 1 glucometer, Disp: 1 kit, Rfl: 0 .  calcium-vitamin D (OSCAL-500) 500-400 MG-UNIT tablet, Take 1 tablet by mouth 2 (two) times daily., Disp: 60 tablet, Rfl: 0 .  fish oil-omega-3 fatty acids 1000 MG capsule, Take 1,200 mg by mouth daily. , Disp: , Rfl:  .  gabapentin (NEURONTIN) 100 MG capsule, Take 1 capsule (100 mg total) by mouth 3 (three) times daily., Disp: 90 capsule, Rfl: 3 .  glucose blood test strip, Use as instructed, Disp: 100 each, Rfl: 12 .  iron polysaccharides (FERREX 150) 150 MG capsule, Take 1 capsule (150 mg total) by mouth daily., Disp: 60 capsule, Rfl: 5 .  ketoconazole (NIZORAL) 2 % cream, APPLY TO AFFECTED AREA TWICE A DAY, Disp: 60 g, Rfl: 0 .  losartan (COZAAR) 25 MG tablet, TAKE 1 TABLET BY MOUTH ONCE DAILY, Disp: 90 tablet, Rfl: 3 .  metoprolol tartrate (LOPRESSOR) 25 MG tablet, Take 1/2 (one-half) tablet by mouth twice daily, Disp: 180 tablet, Rfl: 0 .  omeprazole (PRILOSEC) 20 MG capsule, Take 20 mg by mouth as needed. Reported on 12/17/2015, Disp: , Rfl:  .  polyethylene glycol powder (GLYCOLAX/MIRALAX)  powder, Take 17 g by mouth 2 (two) times daily as needed., Disp: 3350 g, Rfl: 1 .  predniSONE (DELTASONE) 20 MG tablet, Take 1 tablet (20 mg total) by mouth 2 (two) times daily with a meal., Disp: 10 tablet, Rfl: 0 .  simethicone (MYLICON) 509 MG chewable tablet, Chew 1 tablet (125 mg total) by mouth every 6 (six) hours as needed for flatulence., Disp: 30 tablet, Rfl: 0 .  traMADol (ULTRAM) 50 MG tablet, Take 1 tablet (50 mg total) by mouth every 12 (twelve) hours as needed., Disp: 60 tablet, Rfl: 0 .  triamcinolone cream (KENALOG) 0.1 %, apply to affected area twice a day, Disp: 30 g, Rfl: 2 .  warfarin (COUMADIN) 5 MG tablet, Take as directed by coumadin clinic., Disp: 120 tablet, Rfl: 0  Allergies  Allergen Reactions  . Lisinopril Cough    Objective: Vitals:   11/30/18 1119  Temp: (!) 97.3 F (36.3 C)   Vascular Examination: Capillary refill time <3 seconds  x 10 digits  Dorsalis pedis and Posterior tibial pulses palpable b/l  Digital hair absent x 10 digits  Skin temperature gradient WNL b/l  Dermatological Examination: Skin with normal turgor, texture and tone b/l  Toenails 1-5 b/l discolored, thick, dystrophic with subungual debris and pain with palpation to nailbeds due to thickness of nails.  Musculoskeletal: Muscle strength 5/5 to all LE muscle groups  No gross bony deformities b/l.  No pain, crepitus or  joint limitation noted with ROM.   Neurological: Sensation intact with 10 gram monofilament.  Vibratory sensation intact.  Assessment: Painful onychomycosis toenails 1-5 b/l  NIDDM  Plan: 1. Continue diabetic foot care principles. Literature dispensed. 2. Toenails 1-5 b/l were debrided in length and girth without iatrogenic bleeding. 3. Patient to continue soft, supportive shoe gear daily. 4. Patient to report any pedal injuries to medical professional immediately. 5. Follow up 3 months.  6. Patient/POA to call should there be a concern in the interim.

## 2018-12-09 ENCOUNTER — Other Ambulatory Visit: Payer: Self-pay

## 2018-12-09 ENCOUNTER — Other Ambulatory Visit (INDEPENDENT_AMBULATORY_CARE_PROVIDER_SITE_OTHER): Payer: Medicare Other | Admitting: *Deleted

## 2018-12-09 DIAGNOSIS — Z7901 Long term (current) use of anticoagulants: Secondary | ICD-10-CM | POA: Diagnosis not present

## 2018-12-09 LAB — POCT INR: INR: 2.4 (ref 2.0–3.0)

## 2019-01-06 ENCOUNTER — Ambulatory Visit (INDEPENDENT_AMBULATORY_CARE_PROVIDER_SITE_OTHER): Payer: Medicare Other | Admitting: *Deleted

## 2019-01-06 ENCOUNTER — Other Ambulatory Visit: Payer: Self-pay

## 2019-01-06 DIAGNOSIS — Z7901 Long term (current) use of anticoagulants: Secondary | ICD-10-CM

## 2019-01-06 LAB — POCT INR: INR: 1.8 — AB (ref 2.0–3.0)

## 2019-01-07 DIAGNOSIS — Z1231 Encounter for screening mammogram for malignant neoplasm of breast: Secondary | ICD-10-CM | POA: Diagnosis not present

## 2019-01-17 ENCOUNTER — Other Ambulatory Visit: Payer: Self-pay | Admitting: Family Medicine

## 2019-01-20 ENCOUNTER — Ambulatory Visit (INDEPENDENT_AMBULATORY_CARE_PROVIDER_SITE_OTHER): Payer: Medicare Other | Admitting: *Deleted

## 2019-01-20 ENCOUNTER — Other Ambulatory Visit: Payer: Self-pay

## 2019-01-20 ENCOUNTER — Telehealth: Payer: Self-pay

## 2019-01-20 DIAGNOSIS — Z7901 Long term (current) use of anticoagulants: Secondary | ICD-10-CM

## 2019-01-20 LAB — POCT INR: INR: 2.7 (ref 2.0–3.0)

## 2019-01-20 NOTE — Telephone Encounter (Signed)
Patient calls nurse line stating her Metoprolol prescription was written wrong. Patient states she takes (1) 25mg  tab BID. However, it was sent in for 1/2 25mg  tab BID. Please advise.

## 2019-01-21 MED ORDER — METOPROLOL TARTRATE 25 MG PO TABS: ORAL_TABLET | ORAL | 0 refills | Status: DC

## 2019-01-21 NOTE — Telephone Encounter (Signed)
Resent as needed

## 2019-01-24 DIAGNOSIS — H35033 Hypertensive retinopathy, bilateral: Secondary | ICD-10-CM | POA: Diagnosis not present

## 2019-01-24 DIAGNOSIS — H524 Presbyopia: Secondary | ICD-10-CM | POA: Diagnosis not present

## 2019-01-24 DIAGNOSIS — H2513 Age-related nuclear cataract, bilateral: Secondary | ICD-10-CM | POA: Diagnosis not present

## 2019-01-24 LAB — HM DIABETES EYE EXAM

## 2019-01-28 ENCOUNTER — Ambulatory Visit (INDEPENDENT_AMBULATORY_CARE_PROVIDER_SITE_OTHER): Payer: Medicare Other | Admitting: Family Medicine

## 2019-01-28 ENCOUNTER — Other Ambulatory Visit: Payer: Self-pay

## 2019-01-28 VITALS — BP 168/80 | HR 73 | Temp 98.5°F

## 2019-01-28 DIAGNOSIS — J0111 Acute recurrent frontal sinusitis: Secondary | ICD-10-CM

## 2019-01-28 MED ORDER — AMOXICILLIN 875 MG PO TABS: 875.0000 mg | ORAL_TABLET | Freq: Two times a day (BID) | ORAL | 0 refills | Status: DC

## 2019-01-28 NOTE — Assessment & Plan Note (Signed)
Since this has been going on for 2 weeks and symptoms are worsening, we will prescribe amoxicillin 875 mg twice daily for 10 days.  Also counseled patient to continue symptom relief with drinking hot tea at night with honey and picking up the generic version of Claritin, Zyrtec, or Allegra taking this once per day.  Clinical exam is reassuring.  Counseled patient that most sinus infections are viral, but since hers has been going on for so long, will try a course of antibiotics.  Patient advised to call in 5 days if she has not improved at all.

## 2019-01-28 NOTE — Progress Notes (Signed)
   Subjective:    Erin Coffey - 73 y.o. female MRN [id number]  Date of birth: October 12, 1945  CC:  Erin Coffey is here for possible sinus infection and ear infection.  HPI: Patient reports that for the past 2 weeks, she has had left-sided sinus pressure along with frequent headache, jaw pain, and ear pain, all of which have been worsening.  She says this happens to her especially when it is more raining outside.  She denies mucopurulent rhinorrhea but does have a productive cough, although this seems to be acute on chronic.  She is taken Tylenol sinus for relief, which is minimally helpful.  She says that the antibiotic she got at her last visit due to acute sinusitis were helpful for her.  Health Maintenance:  Health Maintenance Due  Topic Date Due  . OPHTHALMOLOGY EXAM  01/01/2019    -  reports that she has never smoked. She has never used smokeless tobacco. - Review of Systems: Per HPI. - Past Medical History: Patient Active Problem List   Diagnosis Date Noted  . Chronic left hip pain 08/17/2018  . Leg swelling 07/22/2018  . Left sided numbness 07/22/2018  . Onychogryphosis 05/31/2018  . Tinea pedis of right foot 05/31/2018  . Low back pain potentially associated with radiculopathy 01/29/2018  . Acute sinus infection 10/29/2017  . Diverticulosis of colon 03/24/2017  . Internal hemorrhoid 03/24/2017  . Hypertensive retinopathy of right eye 01/20/2017  . Hypermetropia of both eyes 01/20/2017  . Presbyopia 01/20/2017  . Back pain 01/05/2017  . Osteopenia 05/28/2016  . Chronic kidney disease 04/29/2016  . Normocytic anemia 04/29/2016  . Dark stools 10/19/2015  . Rotator cuff dysfunction 06/25/2015  . Thigh cramp 06/25/2015  . Long term (current) use of anticoagulants 05/08/2011  . HYPERLIPIDEMIA 08/08/2008  . Type 2 diabetes mellitus with hemoglobin A1c goal of less than 7.5% ([place]) 06/05/2008  . HYPERTENSION, BENIGN ESSENTIAL 11/27/2006  . OBESITY, NOS 10/08/2006   . GASTROESOPHAGEAL REFLUX, NO ESOPHAGITIS 10/08/2006  . Osteoarthritis, multiple sites 10/08/2006   - Medications: reviewed and updated   Objective:   Physical Exam BP (!) 168/80   Pulse 73   Temp 98.5 F (36.9 C)   SpO2 98%  Gen: NAD, alert, cooperative with exam, well-appearing, appears stated age HEENT: NCAT, PERRL, clear conjunctiva, oropharynx clear, supple neck, TMs clear bilaterally CV: RRR, good S1/S2, no murmur Resp: CTABL, no wheezes, non-labored Psych: good insight, alert and oriented        Assessment & Plan:   Acute sinus infection Since this has been going on for 2 weeks and symptoms are worsening, we will prescribe amoxicillin 875 mg twice daily for 10 days.  Also counseled patient to continue symptom relief with drinking hot tea at night with honey and picking up the generic version of Claritin, Zyrtec, or Allegra taking this once per day.  Clinical exam is reassuring.  Counseled patient that most sinus infections are viral, but since hers has been going on for so long, will try a course of antibiotics.  Patient advised to call in 5 days if she has not improved at all.    Maia Breslow, M.D. 01/28/2019, 8:56 AM PGY-2, [place]

## 2019-01-28 NOTE — Patient Instructions (Signed)
It was nice meeting you today Ms. Erin Coffey!  I am sending in amoxicillin for your sinus infection.  Please take this medication twice daily for 10 days.  It would also be helpful for you to pick up the generic version of Allegra, Claritin, or Zyrtec and take this once per day until your symptoms improve.  If you do not feel better after about 5 days or starts to experience shortness of breath, please let us know.  I hope you feel better soon.  If you have any questions or concerns, please feel free to call the clinic.   Be well,  Dr. Shan Levans

## 2019-02-07 ENCOUNTER — Other Ambulatory Visit: Payer: Self-pay | Admitting: *Deleted

## 2019-02-07 MED ORDER — ONETOUCH VERIO VI STRP: ORAL_STRIP | 12 refills | Status: DC

## 2019-02-07 NOTE — Telephone Encounter (Signed)
Pt called because she needs strips for her meter.  Unsure of what kind of meter she has.  LMOVM for pt to return call.   According to pharmacy sheet.  [employer] covers one touch verio.  Will await callback. Christen Bame, CMA

## 2019-02-07 NOTE — Telephone Encounter (Signed)
Will forward to attending that is [place] certified to sign. Christen Bame, CMA

## 2019-02-07 NOTE — Addendum Note (Signed)
Addended by: Christen Bame D on: 02/07/2019 12:29 PM   Modules accepted: Orders

## 2019-02-10 MED ORDER — ONETOUCH VERIO W/DEVICE KIT: PACK | 0 refills | Status: DC

## 2019-02-10 MED ORDER — ONETOUCH DELICA LANCING DEV MISC: 0 refills | Status: DC

## 2019-02-10 MED ORDER — ONETOUCH DELICA LANCETS 33G MISC: 12 refills | Status: DC

## 2019-02-10 NOTE — Addendum Note (Signed)
Addended by: Christen Bame D on: 02/10/2019 12:51 PM   Modules accepted: Orders

## 2019-02-10 NOTE — Telephone Encounter (Signed)
Pt has old one touch meter that is no longer covered by insurance and the verio strips will not fit it.  Will send in for new verio meter.  Christen Bame, CMA

## 2019-02-17 ENCOUNTER — Other Ambulatory Visit: Payer: Self-pay

## 2019-02-17 ENCOUNTER — Ambulatory Visit (INDEPENDENT_AMBULATORY_CARE_PROVIDER_SITE_OTHER): Payer: Medicare Other | Admitting: *Deleted

## 2019-02-17 DIAGNOSIS — Z7901 Long term (current) use of anticoagulants: Secondary | ICD-10-CM

## 2019-02-17 DIAGNOSIS — E119 Type 2 diabetes mellitus without complications: Secondary | ICD-10-CM

## 2019-02-17 LAB — POCT INR: INR: 3 (ref 2.0–3.0)

## 2019-02-17 LAB — POCT GLYCOSYLATED HEMOGLOBIN (HGB A1C): HbA1c, POC (controlled diabetic range): 5.8 % (ref 0.0–7.0)

## 2019-02-25 ENCOUNTER — Other Ambulatory Visit: Payer: Self-pay

## 2019-02-25 NOTE — Patient Outreach (Signed)
[facility]) Care Management  02/25/2019  Erin Coffey 01-21-1946 [id number]   Medication Adherence call to [name] spoke with patient she is past due on Atorvastatin 40 mg patient explain she is only taking it on Tuesday,Thrusday and Saturday patient has plenty for 2 more month patient will order when due [name] is showing past due under [place].   [facility] Management Direct Dial [phone]  Fax [phone] [email or web address]

## 2019-03-01 ENCOUNTER — Other Ambulatory Visit: Payer: Self-pay

## 2019-03-01 ENCOUNTER — Encounter: Payer: Self-pay | Admitting: Podiatry

## 2019-03-01 ENCOUNTER — Ambulatory Visit (INDEPENDENT_AMBULATORY_CARE_PROVIDER_SITE_OTHER): Payer: Medicare Other | Admitting: Podiatry

## 2019-03-01 DIAGNOSIS — M79675 Pain in left toe(s): Secondary | ICD-10-CM | POA: Diagnosis not present

## 2019-03-01 DIAGNOSIS — E119 Type 2 diabetes mellitus without complications: Secondary | ICD-10-CM | POA: Diagnosis not present

## 2019-03-01 DIAGNOSIS — M79674 Pain in right toe(s): Secondary | ICD-10-CM

## 2019-03-01 DIAGNOSIS — B351 Tinea unguium: Secondary | ICD-10-CM | POA: Diagnosis not present

## 2019-03-01 DIAGNOSIS — Z9229 Personal history of other drug therapy: Secondary | ICD-10-CM

## 2019-03-01 NOTE — Patient Instructions (Signed)

## 2019-03-03 ENCOUNTER — Emergency Department (HOSPITAL_COMMUNITY): Payer: Medicare Other

## 2019-03-03 ENCOUNTER — Encounter (HOSPITAL_COMMUNITY): Payer: Self-pay

## 2019-03-03 ENCOUNTER — Ambulatory Visit (INDEPENDENT_AMBULATORY_CARE_PROVIDER_SITE_OTHER): Payer: Medicare Other | Admitting: Family Medicine

## 2019-03-03 ENCOUNTER — Other Ambulatory Visit: Payer: Self-pay

## 2019-03-03 ENCOUNTER — Observation Stay (HOSPITAL_COMMUNITY)
Admission: EM | Admit: 2019-03-03 | Discharge: 2019-03-04 | Disposition: A | Discharge status: Home / Self Care | Payer: Medicare Other | Attending: Family Medicine | Admitting: Family Medicine

## 2019-03-03 ENCOUNTER — Ambulatory Visit (HOSPITAL_COMMUNITY)
Admission: RE | Admit: 2019-03-03 | Discharge: 2019-03-03 | Disposition: A | Discharge status: Home / Self Care | Payer: Medicare Other | Source: Ambulatory Visit | Attending: Family Medicine | Admitting: Family Medicine

## 2019-03-03 ENCOUNTER — Encounter: Payer: Self-pay | Admitting: Family Medicine

## 2019-03-03 VITALS — BP 172/72 | HR 78 | Wt 297.2 lb

## 2019-03-03 DIAGNOSIS — E785 Hyperlipidemia, unspecified: Secondary | ICD-10-CM | POA: Insufficient documentation

## 2019-03-03 DIAGNOSIS — Z7901 Long term (current) use of anticoagulants: Secondary | ICD-10-CM | POA: Diagnosis not present

## 2019-03-03 DIAGNOSIS — E1122 Type 2 diabetes mellitus with diabetic chronic kidney disease: Secondary | ICD-10-CM | POA: Insufficient documentation

## 2019-03-03 DIAGNOSIS — N179 Acute kidney failure, unspecified: Secondary | ICD-10-CM | POA: Diagnosis not present

## 2019-03-03 DIAGNOSIS — Z6841 Body Mass Index (BMI) 40.0 and over, adult: Secondary | ICD-10-CM | POA: Insufficient documentation

## 2019-03-03 DIAGNOSIS — Z86711 Personal history of pulmonary embolism: Secondary | ICD-10-CM | POA: Insufficient documentation

## 2019-03-03 DIAGNOSIS — R079 Chest pain, unspecified: Secondary | ICD-10-CM

## 2019-03-03 DIAGNOSIS — Z86718 Personal history of other venous thrombosis and embolism: Secondary | ICD-10-CM | POA: Insufficient documentation

## 2019-03-03 DIAGNOSIS — Z0184 Encounter for antibody response examination: Secondary | ICD-10-CM | POA: Diagnosis not present

## 2019-03-03 DIAGNOSIS — E669 Obesity, unspecified: Secondary | ICD-10-CM | POA: Diagnosis not present

## 2019-03-03 DIAGNOSIS — Z888 Allergy status to other drugs, medicaments and biological substances status: Secondary | ICD-10-CM | POA: Insufficient documentation

## 2019-03-03 DIAGNOSIS — R0602 Shortness of breath: Secondary | ICD-10-CM | POA: Diagnosis not present

## 2019-03-03 DIAGNOSIS — I2699 Other pulmonary embolism without acute cor pulmonale: Secondary | ICD-10-CM | POA: Diagnosis not present

## 2019-03-03 DIAGNOSIS — Z1159 Encounter for screening for other viral diseases: Secondary | ICD-10-CM | POA: Diagnosis not present

## 2019-03-03 DIAGNOSIS — I2609 Other pulmonary embolism with acute cor pulmonale: Secondary | ICD-10-CM | POA: Diagnosis not present

## 2019-03-03 DIAGNOSIS — M8580 Other specified disorders of bone density and structure, unspecified site: Secondary | ICD-10-CM | POA: Diagnosis not present

## 2019-03-03 DIAGNOSIS — D649 Anemia, unspecified: Secondary | ICD-10-CM | POA: Diagnosis not present

## 2019-03-03 DIAGNOSIS — N183 Chronic kidney disease, stage 3 unspecified: Secondary | ICD-10-CM

## 2019-03-03 DIAGNOSIS — I129 Hypertensive chronic kidney disease with stage 1 through stage 4 chronic kidney disease, or unspecified chronic kidney disease: Secondary | ICD-10-CM | POA: Insufficient documentation

## 2019-03-03 DIAGNOSIS — Z8249 Family history of ischemic heart disease and other diseases of the circulatory system: Secondary | ICD-10-CM | POA: Insufficient documentation

## 2019-03-03 DIAGNOSIS — K219 Gastro-esophageal reflux disease without esophagitis: Secondary | ICD-10-CM | POA: Insufficient documentation

## 2019-03-03 DIAGNOSIS — E119 Type 2 diabetes mellitus without complications: Secondary | ICD-10-CM

## 2019-03-03 DIAGNOSIS — I10 Essential (primary) hypertension: Secondary | ICD-10-CM

## 2019-03-03 LAB — TROPONIN I (HIGH SENSITIVITY)
Troponin I (High Sensitivity): 7 ng/L (ref <18)
Troponin I (High Sensitivity): 6 ng/L (ref <18)

## 2019-03-03 LAB — COMPREHENSIVE METABOLIC PANEL
ALT: 18 U/L (ref 0–44)
AST: 28 U/L (ref 15–41)
Albumin: 3.4 g/dL — ABNORMAL LOW (ref 3.5–5.0)
Alkaline Phosphatase: 66 U/L (ref 38–126)
Anion gap: 8 (ref 5–15)
BUN: 14 mg/dL (ref 8–23)
CO2: 22 mmol/L (ref 22–32)
Calcium: 9.3 mg/dL (ref 8.9–10.3)
Chloride: 108 mmol/L (ref 98–111)
Creatinine, Ser: 1.3 mg/dL — ABNORMAL HIGH (ref 0.44–1.00)
GFR calc Af Amer: 48 mL/min — ABNORMAL LOW (ref >60)
GFR calc non Af Amer: 41 mL/min — ABNORMAL LOW (ref >60)
Glucose, Bld: 102 mg/dL — ABNORMAL HIGH (ref 70–99)
Potassium: 4 mmol/L (ref 3.5–5.1)
Sodium: 138 mmol/L (ref 135–145)
Total Bilirubin: 0.8 mg/dL (ref 0.3–1.2)
Total Protein: 8.5 g/dL — ABNORMAL HIGH (ref 6.5–8.1)

## 2019-03-03 LAB — CBC
HCT: 35.6 % — ABNORMAL LOW (ref 36.0–46.0)
Hemoglobin: 11.3 g/dL — ABNORMAL LOW (ref 12.0–15.0)
MCH: 28.9 pg (ref 26.0–34.0)
MCHC: 31.7 g/dL (ref 30.0–36.0)
MCV: 91 fL (ref 80.0–100.0)
Platelets: 205 10*3/uL (ref 150–400)
RBC: 3.91 MIL/uL (ref 3.87–5.11)
RDW: 15.8 % — ABNORMAL HIGH (ref 11.5–15.5)
WBC: 4.4 10*3/uL (ref 4.0–10.5)
nRBC: 0 % (ref 0.0–0.2)

## 2019-03-03 LAB — PROTIME-INR
INR: 2.3 — ABNORMAL HIGH (ref 0.8–1.2)
Prothrombin Time: 25.4 seconds — ABNORMAL HIGH (ref 11.4–15.2)

## 2019-03-03 LAB — CBG MONITORING, ED: Glucose-Capillary: 76 mg/dL (ref 70–99)

## 2019-03-03 LAB — SARS CORONAVIRUS 2 BY RT PCR (HOSPITAL ORDER, PERFORMED IN ~~LOC~~ HOSPITAL LAB): SARS Coronavirus 2: NEGATIVE

## 2019-03-03 LAB — BRAIN NATRIURETIC PEPTIDE: B Natriuretic Peptide: 21.1 pg/mL (ref 0.0–100.0)

## 2019-03-03 MED ORDER — ENOXAPARIN SODIUM 150 MG/ML ~~LOC~~ SOLN
1.0000 mg/kg | Freq: Two times a day (BID) | SUBCUTANEOUS | Status: DC
  Administered 2019-03-03 – 2019-03-04 (×3): 135 mg via SUBCUTANEOUS
  Filled 2019-03-03 (×4): qty 0.9

## 2019-03-03 MED ORDER — POLYSACCHARIDE IRON COMPLEX 150 MG PO CAPS: 150.0000 mg | ORAL_CAPSULE | Freq: Every day | ORAL | 5 refills | Status: DC

## 2019-03-03 MED ORDER — AMLODIPINE BESYLATE 5 MG PO TABS
5.0000 mg | ORAL_TABLET | Freq: Every day | ORAL | Status: DC
  Administered 2019-03-03 – 2019-03-04 (×2): 5 mg via ORAL
  Filled 2019-03-03 (×2): qty 1

## 2019-03-03 MED ORDER — KETOCONAZOLE 2 % EX CREA: TOPICAL_CREAM | CUTANEOUS | 0 refills | Status: DC

## 2019-03-03 MED ORDER — ACETAMINOPHEN 325 MG PO TABS
650.0000 mg | ORAL_TABLET | Freq: Four times a day (QID) | ORAL | Status: DC | PRN
  Administered 2019-03-03: 650 mg via ORAL
  Filled 2019-03-03: qty 2

## 2019-03-03 MED ORDER — RAMELTEON 8 MG PO TABS
8.0000 mg | ORAL_TABLET | Freq: Every day | ORAL | Status: AC
  Administered 2019-03-04: 8 mg via ORAL
  Filled 2019-03-03: qty 1

## 2019-03-03 MED ORDER — ATORVASTATIN CALCIUM 40 MG PO TABS
40.0000 mg | ORAL_TABLET | Freq: Every day | ORAL | Status: DC
  Administered 2019-03-03: 40 mg via ORAL
  Filled 2019-03-03 (×2): qty 1

## 2019-03-03 MED ORDER — IOHEXOL 350 MG/ML SOLN
85.0000 mL | Freq: Once | INTRAVENOUS | Status: AC | PRN
  Administered 2019-03-03: 85 mL via INTRAVENOUS

## 2019-03-03 MED ORDER — DICLOFENAC SODIUM 1 % TD GEL: 2.0000 g | Freq: Four times a day (QID) | TRANSDERMAL | Status: DC

## 2019-03-03 MED ORDER — SODIUM CHLORIDE 0.9 % IV BOLUS
500.0000 mL | Freq: Once | INTRAVENOUS | Status: AC
  Administered 2019-03-03: 500 mL via INTRAVENOUS

## 2019-03-03 MED ORDER — DICLOFENAC SODIUM 1 % TD GEL
2.0000 g | Freq: Four times a day (QID) | TRANSDERMAL | Status: DC
  Administered 2019-03-03 – 2019-03-04 (×3): 2 g via TOPICAL
  Filled 2019-03-03: qty 100

## 2019-03-03 MED ORDER — AMLODIPINE BESYLATE 10 MG PO TABS: 10.0000 mg | ORAL_TABLET | Freq: Every day | ORAL | 1 refills | Status: DC

## 2019-03-03 MED ORDER — WARFARIN SODIUM 5 MG PO TABS: ORAL_TABLET | ORAL | 0 refills | Status: DC

## 2019-03-03 NOTE — Progress Notes (Signed)
ANTICOAGULATION CONSULT NOTE - Initial Consult  Pharmacy Consult for Lovenox Indication: pulmonary embolus  Allergies  Allergen Reactions  . Lisinopril Cough    Patient Measurements: Height: 5\' 9"  (175.3 cm) Weight: 297 lb (134.7 kg) IBW/kg (Calculated) : 66.2  Vital Signs: Temp: 99 F (37.2 C) (07/23 0939) BP: 159/55 (07/23 1515) Pulse Rate: 58 (07/23 1515)  Labs: Recent Labs    03/03/19 0951 03/03/19 1202  HGB 11.3*  --   HCT 35.6*  --   PLT 205  --   LABPROT 25.4*  --   INR 2.3*  --   CREATININE 1.30*  --   TROPONINIHS 7 6    Estimated Creatinine Clearance: 58.7 mL/min (A) (by C-G formula based on SCr of 1.3 mg/dL (H)).   Medical History: Past Medical History:  Diagnosis Date  . Allergy   . Anemia, iron deficiency   . ANXIETY, SITUATIONAL 04/06/2009   Qualifier: Diagnosis of  By: [name]  MD, Colletta [place]    . Arthritis   . BACK PAIN W/RADIATION, UNSPECIFIED 10/08/2006   Qualifier: Diagnosis of  By: [name]    . Back pain, thoracic 12/20/2012  . Carpal tunnel syndrome 12/07/2014  . Cholelithiasis 06/25/2012  . Chronic kidney disease   . Diabetes mellitus   . DM type 2 goal A1C below 7.5 06/05/2008   Qualifier: Diagnosis of  By: [name]  MD, Colletta [place]    . GASTROESOPHAGEAL REFLUX, NO ESOPHAGITIS 10/08/2006   Qualifier: Diagnosis of  By: [name]    . GERD (gastroesophageal reflux disease)   . Goiter, unspecified 10/21/2007   Qualifier: Diagnosis of  By: [name] MD, [name]    . History of DVT (deep vein thrombosis) 05/05/2013  . Hx of pulmonary embolus 05/08/2011  . HYPERLIPIDEMIA 08/08/2008   Qualifier: Diagnosis of  By: [name]  MD, Colletta [place]    . Hypertension   . HYPERTENSION, BENIGN ESSENTIAL 11/27/2006   Qualifier: Diagnosis of  By: [name] MD, [place]    . Left tennis elbow 11/02/2014  . Long term (current) use of anticoagulants 05/08/2011  . Palpitations 10/01/2012  . Personal history of colonic polyps 10/24/2009   Qualifier: Diagnosis of  By: [name] MD, [place]   . Pica 08/08/2009   Qualifier: Diagnosis of  By: [name], [name]    . Polymyalgia rheumatica ([place]) 10/02/2009   Qualifier: Diagnosis of  By: [name] MD, Apolonio Schneiders    . Pulmonary embolism ([place]) 04/28/11  . RENAL FAILURE 10/18/2009   Qualifier: History of  By: [name]  MD, Colletta [place]    . Thyroid disease   . Trigger ring finger of left hand 11/02/2014  . UNSPECIFIED IRON DEFICIENCY ANEMIA 10/02/2009   Qualifier: Diagnosis of  By: [name] MD, Apolonio Schneiders      Assessment: Pt is a 73YOF w/ c/o chest pressure that started about a week ago. Of note, pt is on coumadin PTA (last dose 7/22) and INR is therapeutic at 2.3. CT showed mild PE in RLL w/o evidence of heart strain. Pt's last INR was 3 on 7/9. Appears pt was therapeutic when PE developed. Hgb 11.3 and Plts 205. Pharmacy to dose Lovenox.  Goal of Therapy:  Anti-Xa level 0.6-1 units/ml 4hrs after LMWH dose given Monitor platelets by anticoagulation protocol: Yes   Plan:  Lovenox 135mg  SQ q12h Consider switching pt to [place] outpatient Monitor renal function and clinical progression, s/sx of bleed  Natale Lay 03/03/2019,3:40 PM

## 2019-03-03 NOTE — ED Triage Notes (Signed)
Pt sent here by PCP for PE rule out. Pt reports she had some chest pressure that started about a week ago. Does have a history of a PE in 2012 and takes warfarin. Reports some shortness of breath with exertion but none at rest. Pt is alert and oriented upon ED arrival. Reports she did not take her blood pressure medication last evening.

## 2019-03-03 NOTE — ED Provider Notes (Addendum)
[facility] EMERGENCY DEPARTMENT Provider Note   CSN: [id number] Arrival date & time: 03/03/19  9702    History   Chief Complaint Chief Complaint  Patient presents with  . Chest Pain    HPI Erin Coffey is a 73 y.o. female with a PMH of T2DM, DVT, PE x 2 in 2012 on warfarin, polymalgia rheumatic, HTN, GERD, CKD, and HLD presenting with constant central chest pain radiating to left shoulder onset 5 days ago. Patient reports symptoms have been significantly worse today. Patient describes chest pain as a pressure on her chest. Patient states nothing makes symptoms better or worse. Patient states symptoms are similar to her previous pulmonary embolisms. Patient reports shortness of breath with exertion. Patient denies leg edema, leg pain, cough, congestion, fever, sick exposures, recent travel, or recent surgery. Patient denies nausea, vomiting, or abdominal pain. Patient denies dizziness, weakness, or syncope. Patient states she has been compliant with her medications Patient states she was evaluated by her PCP and advised to come to the ER.      HPI  Past Medical History:  Diagnosis Date  . Allergy   . Anemia, iron deficiency   . ANXIETY, SITUATIONAL 04/06/2009   Qualifier: Diagnosis of  By: [name]  MD, Colletta Maryland    . Arthritis   . BACK PAIN W/RADIATION, UNSPECIFIED 10/08/2006   Qualifier: Diagnosis of  By: [name]    . Back pain, thoracic 12/20/2012  . Carpal tunnel syndrome 12/07/2014  . Cholelithiasis 06/25/2012  . Chronic kidney disease   . Diabetes mellitus   . DM type 2 goal A1C below 7.5 06/05/2008   Qualifier: Diagnosis of  By: [name]  MD, Colletta Maryland    . GASTROESOPHAGEAL REFLUX, NO ESOPHAGITIS 10/08/2006   Qualifier: Diagnosis of  By: [name]    . GERD (gastroesophageal reflux disease)   . Goiter, unspecified 10/21/2007   Qualifier: Diagnosis of  By: [name] MD, [name]    . History of DVT (deep vein thrombosis) 05/05/2013  . Hx of pulmonary embolus  05/08/2011  . HYPERLIPIDEMIA 08/08/2008   Qualifier: Diagnosis of  By: [name]  MD, Colletta Maryland    . Hypertension   . HYPERTENSION, BENIGN ESSENTIAL 11/27/2006   Qualifier: Diagnosis of  By: [name] MD, [place]    . Left tennis elbow 11/02/2014  . Long term (current) use of anticoagulants 05/08/2011  . Palpitations 10/01/2012  . Personal history of colonic polyps 10/24/2009   Qualifier: Diagnosis of  By: [name] MD, Sandy Sala[place]   . Pica 08/08/2009   Qualifier: Diagnosis of  By: [name], [name]    . Polymyalgia rheumatica ([place]) 10/02/2009   Qualifier: Diagnosis of  By: [name] MD, Apolonio Schneiders    . Pulmonary embolism ([place]) 04/28/11  . RENAL FAILURE 10/18/2009   Qualifier: History of  By: [name]  MD, Colletta Maryland    . Thyroid disease   . Trigger ring finger of left hand 11/02/2014  . UNSPECIFIED IRON DEFICIENCY ANEMIA 10/02/2009   Qualifier: Diagnosis of  By: [name] MD, Apolonio Schneiders      Patient Active Problem List   Diagnosis Date Noted  . Chronic left hip pain 08/17/2018  . Leg swelling 07/22/2018  . Left sided numbness 07/22/2018  . Onychogryphosis 05/31/2018  . Tinea pedis of right foot 05/31/2018  . Low back pain potentially associated with radiculopathy 01/29/2018  . Acute sinus infection 10/29/2017  . Diverticulosis of colon 03/24/2017  . Internal hemorrhoid 03/24/2017  . Hypertensive retinopathy of right eye 01/20/2017  . Hypermetropia of  both eyes 01/20/2017  . Presbyopia 01/20/2017  . Back pain 01/05/2017  . Osteopenia 05/28/2016  . Chronic kidney disease 04/29/2016  . Normocytic anemia 04/29/2016  . Dark stools 10/19/2015  . Rotator cuff dysfunction 06/25/2015  . Thigh cramp 06/25/2015  . Long term (current) use of anticoagulants 05/08/2011  . HYPERLIPIDEMIA 08/08/2008  . Type 2 diabetes mellitus with hemoglobin A1c goal of less than 7.5% ([place]) 06/05/2008  . HYPERTENSION, BENIGN ESSENTIAL 11/27/2006  . OBESITY, NOS 10/08/2006  . GASTROESOPHAGEAL REFLUX, NO ESOPHAGITIS 10/08/2006  .  Osteoarthritis, multiple sites 10/08/2006    Past Surgical History:  Procedure Laterality Date  . ABDOMINAL HYSTERECTOMY    . CARDIAC CATHETERIZATION  04/29/11   Clean cardiac cath, no blockages.   . CARPAL TUNNEL RELEASE Right 1996  . KNEE ARTHROSCOPY  Bilateral  . TUBAL LIGATION       OB History   No obstetric history on file.      Home Medications    Prior to Admission medications   Medication Sig Start Date End Date Taking? Authorizing Provider  amLODipine (NORVASC) 10 MG tablet Take 1 tablet (10 mg total) by mouth daily. Patient taking differently: Take 5 mg by mouth daily.  03/03/19  Yes Bonnita Hollow, MD  atorvastatin (LIPITOR) 40 MG tablet Take 1 tablet (40 mg total) by mouth daily. Patient taking differently: Take 40 mg by mouth every Tuesday, Thursday, and Saturday at 6 PM.  10/20/18  Yes Bonnita Hollow, MD  baclofen (LIORESAL) 10 MG tablet Take 1 tablet (10 mg total) by mouth daily as needed for muscle spasms. 05/31/18  Yes Bonnita Hollow, MD  calcium-vitamin D (OSCAL-500) 500-400 MG-UNIT tablet Take 1 tablet by mouth 2 (two) times daily. 05/28/16  Yes Smiley Houseman, MD  diclofenac sodium (VOLTAREN) 1 % GEL Apply 2 g topically 4 (four) times daily. Patient taking differently: Apply 2 g topically 4 (four) times daily. Back 03/03/19  Yes Bonnita Hollow, MD  fish oil-omega-3 fatty acids 1000 MG capsule Take 1,200 mg by mouth daily.    Yes [provider]  gabapentin (NEURONTIN) 100 MG capsule Take 1 capsule (100 mg total) by mouth 3 (three) times daily. Patient taking differently: Take 100 mg by mouth as needed (pain in hip).  01/28/18  Yes Hensel, Jamal Collin, MD  iron polysaccharides (FERREX 150) 150 MG capsule Take 1 capsule (150 mg total) by mouth daily. 03/03/19  Yes Bonnita Hollow, MD  ketoconazole (NIZORAL) 2 % cream APPLY TO AFFECTED AREA TWICE A DAY Patient taking differently: Apply 1 application topically daily as needed ([place]).  03/03/19   Yes Bonnita Hollow, MD  losartan (COZAAR) 25 MG tablet TAKE 1 TABLET BY MOUTH ONCE DAILY Patient taking differently: Take 25 mg by mouth daily.  07/12/18  Yes Bonnita Hollow, MD  metoprolol tartrate (LOPRESSOR) 25 MG tablet Take 1 tablet by mouth twice daily Patient taking differently: Take 25 mg by mouth 2 (two) times daily.  01/21/19  Yes Bonnita Hollow, MD  omeprazole (PRILOSEC) 20 MG capsule Take 20 mg by mouth as needed (Heartburn). Reported on 12/17/2015   Yes [provider]  polyethylene glycol powder (GLYCOLAX/MIRALAX) powder Take 17 g by mouth 2 (two) times daily as needed. 08/17/18  Yes Rory Percy, DO  triamcinolone cream (KENALOG) 0.1 % apply to affected area twice a day Patient taking differently: Apply 1 application topically as needed ([place]).  08/17/18  Yes Rory Percy, DO  warfarin (COUMADIN) 5  MG tablet Take as directed by coumadin clinic. Patient taking differently: Take 5 mg by mouth See admin instructions. Take 7.5 mg on Monday and Thursday in the evening Take 5 mg all the other days 03/03/19  Yes Bonnita Hollow, MD  amoxicillin (AMOXIL) 875 MG tablet Take 1 tablet (875 mg total) by mouth 2 (two) times daily. Patient not taking: Reported on 03/03/2019 01/28/19   Kathrene Alu, MD  Blood Glucose Monitoring Suppl (ONETOUCH VERIO) w/Device KIT Use as instructed to test sugars once daily.  Dx Code: E11.9 02/10/19   Martyn Malay, MD  glucose blood [facility] VERIO) test strip Use as instructed to test sugars once daily.  Dx Code: E11.9 02/07/19   Josephine Igo B, MD  glucose blood test strip Use as instructed 12/10/15   Smiley Houseman, MD  Lancet Devices (ONE TOUCH DELICA LANCING DEV) MISC Use as instructed to test sugars once daily.  Dx Code: E11.9 02/10/19   Martyn Malay, MD  OneTouch Delica Lancets 65V MISC Use as instructed to test sugars once daily.  Dx Code: E11.9 02/10/19   Martyn Malay, MD  predniSONE (DELTASONE) 20 MG tablet Take 1 tablet  (20 mg total) by mouth 2 (two) times daily with a meal. Patient not taking: Reported on 03/03/2019 01/29/18   Zenia Resides, MD  simethicone (MYLICON) 374 MG chewable tablet Chew 1 tablet (125 mg total) by mouth every 6 (six) hours as needed for flatulence. Patient not taking: Reported on 03/03/2019 04/30/17   Nicolette Bang, DO  traMADol (ULTRAM) 50 MG tablet Take 1 tablet (50 mg total) by mouth every 12 (twelve) hours as needed. Patient not taking: Reported on 03/03/2019 12/31/17   Smiley Houseman, MD    Family History Family History  Problem Relation Age of Onset  . Diabetes Mother   . Heart disease Mother   . Hypertension Mother   . Heart disease Father   . Hypertension Father   . Diabetes Sister   . Heart disease Sister   . Hypertension Brother   . Cancer Sister 37       lukemia  . Cirrhosis Sister        alcohol  . Kidney disease Sister   . Diabetes Sister   . Diabetes Brother   . Colon cancer Neg Hx     Social History Social History   Tobacco Use  . Smoking status: Never Smoker  . Smokeless tobacco: Never Used  Substance Use Topics  . Alcohol use: No    Alcohol/week: 0.0 standard drinks  . Drug use: No     Allergies   Lisinopril   Review of Systems Review of Systems  Constitutional: Negative for activity change, appetite change, chills, diaphoresis, fatigue, fever and unexpected weight change.  HENT: Negative for congestion and rhinorrhea.   Respiratory: Positive for shortness of breath. Negative for cough, chest tightness and wheezing.   Cardiovascular: Positive for chest pain. Negative for palpitations and leg swelling.  Gastrointestinal: Negative for abdominal pain, nausea and vomiting.  Endocrine: Negative for cold intolerance and heat intolerance.  Musculoskeletal: Negative for back pain.  Skin: Negative for rash.  Allergic/Immunologic: Negative for immunocompromised state.  Neurological: Negative for dizziness, syncope, weakness and  light-headedness.  Psychiatric/Behavioral: Negative for agitation and behavioral problems. The patient is not nervous/anxious.      Physical Exam Updated Vital Signs BP (!) 159/55   Pulse (!) 58   Temp 99 F (37.2 C)   Resp  17   Ht _0  (1.753 m)   Wt 134.7 kg   SpO2 100%   BMI 43.86 kg/m   Physical Exam Vitals signs and nursing note reviewed.  Constitutional:      General: She is not in acute distress.    Appearance: She is well-developed. She is not diaphoretic.  HENT:     Head: Normocephalic and atraumatic.  Neck:     Musculoskeletal: Normal range of motion and neck supple.     Vascular: No JVD.  Cardiovascular:     Rate and Rhythm: Normal rate and regular rhythm.     Pulses: Normal pulses.          Radial pulses are 2+ on the right side and 2+ on the left side.       Dorsalis pedis pulses are 2+ on the right side and 2+ on the left side.     Heart sounds: Normal heart sounds. No murmur. No friction rub. No gallop.   Pulmonary:     Effort: Pulmonary effort is normal. No respiratory distress.     Breath sounds: Normal breath sounds. No wheezing, rhonchi or rales.     Comments: Patient is speaking in full sentences without difficulty. Oxygen saturation is 100% on room air. Chest:     Chest wall: Tenderness (Central chest tenderness to palpation.) present.  Abdominal:     Palpations: Abdomen is soft.     Tenderness: There is no abdominal tenderness.  Musculoskeletal: Normal range of motion.     Right lower leg: She exhibits no tenderness. No edema.     Left lower leg: She exhibits no tenderness. No edema.  Skin:    Capillary Refill: Capillary refill takes less than 2 seconds.     Coloration: Skin is not pale.     Findings: No rash.  Neurological:     Mental Status: She is alert.      ED Treatments / Results  Labs (all labs ordered are listed, but only abnormal results are displayed) Labs Reviewed  CBC - Abnormal; Notable for the following components:       Result Value   Hemoglobin 11.3 (*)    HCT 35.6 (*)    RDW 15.8 (*)    All other components within normal limits  COMPREHENSIVE METABOLIC PANEL - Abnormal; Notable for the following components:   Glucose, Bld 102 (*)    Creatinine, Ser 1.30 (*)    Total Protein 8.5 (*)    Albumin 3.4 (*)    GFR calc non Af Amer 41 (*)    GFR calc Af Amer 48 (*)    All other components within normal limits  PROTIME-INR - Abnormal; Notable for the following components:   Prothrombin Time 25.4 (*)    INR 2.3 (*)    All other components within normal limits  SARS CORONAVIRUS 2 (HOSPITAL ORDER, [facility])  BRAIN NATRIURETIC PEPTIDE  TROPONIN I (HIGH SENSITIVITY)  TROPONIN I (HIGH SENSITIVITY)    EKG EKG Interpretation  Date/Time:  Thursday March 03 2019 09:39:07 EDT Ventricular Rate:  69 PR Interval:    QRS Duration: 97 QT Interval:  436 QTC Calculation: 468 R Axis:   5 Text Interpretation:  Sinus rhythm Probable left atrial enlargement Low voltage, precordial leads No acute changes No significant change since last tracing Confirmed by Varney Biles ([id number]) on 03/03/2019 11:04:57 AM Also confirmed by Varney Biles [id number]), editor Philomena Doheny [phone])  on 03/03/2019 11:08:13 AM  Radiology Ct Angio Chest Pe W/cm &/or Wo Cm  Result Date: 03/03/2019 CLINICAL DATA:  Chest pressure for 2 days, history of prior pulmonary embolism EXAM: CT ANGIOGRAPHY CHEST WITH CONTRAST TECHNIQUE: Multidetector CT imaging of the chest was performed using the standard protocol during bolus administration of intravenous contrast. Multiplanar CT image reconstructions and MIPs were obtained to evaluate the vascular anatomy. CONTRAST:  27m OMNIPAQUE IOHEXOL 350 MG/ML SOLN COMPARISON:  04/30/2011 FINDINGS: Cardiovascular: Thoracic aorta demonstrates atherosclerotic calcifications without aneurysmal dilatation or evidence of dissection. Heart is mildly enlarged in size, stable in appearance. No  significant coronary calcifications are seen. The pulmonary artery shows a normal branching pattern bilaterally. Filling defects are noted in the right lower lobe pulmonary arterial branch consistent with mild pulmonary emboli. The overall embolus burden is significantly less than that seen on prior exam. No evidence of right heart strain is seen. Mediastinum/Nodes: The esophagus is within normal limits. No hilar or mediastinal adenopathy is seen. The thoracic inlet is within normal. Lungs/Pleura: The lungs are well aerated bilaterally. No focal infiltrate or sizable effusion is seen. Focal parenchymal nodules are noted. Upper Abdomen: Visualized upper abdomen is within normal limits. Musculoskeletal: Degenerative changes of the thoracic spine are seen. No acute bony abnormality is noted. Review of the MIP images confirms the above findings. IMPRESSION: Mild pulmonary emboli in the right lower lobe without evidence of right heart strain. Aortic Atherosclerosis (ICD10-I70.0). Electronically Signed   By: MInez CatalinaM.D.   On: 03/03/2019 15:09   Dg Chest Port 1 View  Result Date: 03/03/2019 CLINICAL DATA:  Chest pain EXAM: PORTABLE CHEST 1 VIEW COMPARISON:  04/28/2011 FINDINGS: Mild cardiac enlargement. Negative for heart failure or edema. No infiltrate effusion or mass. Lungs well aerated. IMPRESSION: No active disease. Electronically Signed   By: CFranchot GalloM.D.   On: 03/03/2019 10:31    Procedures .Critical Care Performed by: HArville Lime PA-C Authorized by: HArville Lime PA-C   Critical care provider statement:    Critical care time (minutes):  42   Critical care was time spent personally by me on the following activities:  Discussions with consultants, evaluation of patient's response to treatment, examination of patient, ordering and performing treatments and interventions, ordering and review of laboratory studies, ordering and review of radiographic studies, pulse oximetry,  re-evaluation of patient's condition, obtaining history from patient or surrogate and review of old charts   (including critical care time)  Medications Ordered in ED Medications  sodium chloride 0.9 % bolus 500 mL (500 mLs Intravenous New Bag/Given 03/03/19 1458)  iohexol (OMNIPAQUE) 350 MG/ML injection 85 mL (85 mLs Intravenous Contrast Given 03/03/19 1446)    Initial Impression / Assessment and Plan / ED Course  I have reviewed the triage vital signs and the nursing notes.  Pertinent labs & imaging results that were available during my care of the patient were reviewed by me and considered in my medical decision making (see chart for details).  Clinical Course as of Mar 03 1539  Thu Mar 03, 2019  1037 No active disease.  DG Chest Port 1 View [AH]  1037 Low hemoglobin noted at 11.3. This appears to be baseline when compared to previous values.   Hemoglobin(!): 11.3 [AH]  1038 INR within therapeutic range noted at 2.3.  INR(!): 2.3 [AH]  1214 Initial troponin is 7.  Troponin I (High Sensitivity): 7 [AH]  1305 Second troponin is 6.   Troponin I (High Sensitivity): 6 [AH]  1517 Mild pulmonary  emboli in the right lower lobe without evidence of right heart strain.    CT Angio Chest PE W/Cm &/Or Wo Cm [AH]    Clinical Course User Index [AH] Arville Lime, PA-C      Patient presents with chest pain and shortness of breath on exertion. Troponin levels were 7 and then 6. EKG without acute changes. CXR reveals no active disease. INR is 2.3. Creatinine elevated at 1.30. Provided gentle IVF prior to CTA. CTA ordered due to symptoms and risk factors. CTA reveals mild PE in right lower lobe without right heart strain. Ordered Lovenox. Consulted family practice for admission. Family practice has agreed to admit patient.   Findings and plan of care discussed with supervising physician Dr. Kathrynn Humble who personally evaluated and examined this patient.  Final Clinical Impressions(s) / ED  Diagnoses   Final diagnoses:  Chest pain in adult  Shortness of breath  Acute pulmonary embolism, unspecified pulmonary embolism type, unspecified whether acute cor pulmonale present [facility])    ED Discharge Orders    None       Arville Lime, PA-C 03/03/19 1547    Arville Lime, PA-C 03/03/19 1549    Varney Biles, MD 03/06/19 1413

## 2019-03-03 NOTE — Progress Notes (Signed)
Subjective:  Erin Coffey is a 73 y.o. female who presents to the [facility] today with a chief complaint of chest pain.   HPI: Patient presenting with 4 to 5 days of progressively worsening chest "pressure".  She says that this feels like she had in the past when she had her pulmonary embolism.  Patient has had at least 2 unprovoked VTEs.  Patient is on warfarin therapy.  Patient was last therapeutic on INR at 7/9 with INR of 3.0.  Patient does not endorse any dyspnea.  Says that she has some indigestion associated with this chest pain.  Does deny that this feels like heartburn though.  Movement makes it worse.  ROS: Per HPI  PMH: Does not use IV drugs  Objective:  Physical Exam: BP (!) 172/72   Pulse 78   Wt 297 lb 3.2 oz (134.8 kg)   SpO2 99%   BMI 43.89 kg/m     Ambulatory pulse ox is 98%. Gen: NAD, resting comfortably CV: RRR with no murmurs appreciated Pulm: NWOB, CTAB with no crackles, wheezes, or rhonchi GI: Normal bowel sounds present. Soft, Nontender, Nondistended. MSK: no edema, cyanosis, or clubbing noted Skin: warm, dry Neuro: grossly normal, moves all extremities Psych: Normal affect and thought content  EKG: Sinus bradycardia without T wave derangement  Assessment/Plan:  Chest pain, concern for possible pulmonary embolism Although less therapeutic on warfarin approximately 2 weeks ago, cannot rule in that patient is therapeutic now.  Given that patient does feel like she her prior to pulmonary embolisms, concern that she may have repeat pulmonary embolism and I am sending patient to the emergency department for further evaluation of chest pain.  EKG was without ST abnormalities.  Was satting well on room air even when ambulating.  D-dimer not elevated impression left prior pulmonary embolism.  Will likely need CTA to rule out.  Case was precepted with Dr. Owens Shark.  Health maintenance Patient needs labs as ordered below.  We will make these future and patient can  call and make a lab appointment to have these drawn.  Plan check creatinine for renal function due to diabetes and hypertension, microalbuminemia for diabetic nephropathy, CBC because patient has history of iron deficiency anemia, lipid panel due to hyperlipidemia.  Patient is already on a statin for lipid therapy and losartan for hypertension/renal protection.  Hypertension Patient with persistent blood pressure SBP greater than 150 since October 2019 to present.  Patient says that she forgot to take her blood pressure medication last night.  That being said, still likely needs to have titration of amlodipine.  Encourage patient to take 5 mgx2 until she runs out of her home prescription.  Will refill at 10 mg.  Patient to follow-up in 1 month for blood pressure check.  Medication refills Patient needs medication refills as refill below.  This does not include the Norvasc as mentioned above.  Lab Orders     Lipid panel     CBC with Differential     Basic Metabolic Panel     Microalbumin/Creatinine Ratio, Urine  Meds ordered this encounter  Medications  . amLODipine (NORVASC) 10 MG tablet    Sig: Take 1 tablet (10 mg total) by mouth daily.    Dispense:  30 tablet    Refill:  1  . ketoconazole (NIZORAL) 2 % cream    Sig: APPLY TO AFFECTED AREA TWICE A DAY    Dispense:  60 g    Refill:  0  .  warfarin (COUMADIN) 5 MG tablet    Sig: Take as directed by coumadin clinic.    Dispense:  120 tablet    Refill:  0     Marny Lowenstein, MD, MS FAMILY MEDICINE RESIDENT - PGY3 03/03/2019 8:52 AM

## 2019-03-03 NOTE — H&P (Addendum)
[facility] Admission History and Physical Service Pager: [phone]  Patient name: Erin Coffey Medical record number: [id number] Date of birth: 1946/06/24 Age: 73 y.o. Gender: female  Primary Care Provider: Bonnita Hollow, MD Consultants: none Code Status: full Preferred Emergency Contact: son in [place].[phone]   Chief Complaint: chest pulling  Assessment and Plan: Erin Coffey is a 73 y.o. female presenting with chest discomfort. PMH is significant for Pulmonary Embolism, DVT, HTN, Diabetes, CKD  Chest discomfort 2/2 acute PE- unusual presentation given patient is adherent to warfarin regimen and has been in therapeutic range of 2.3 on admission with a negative d-dimer. Additionally  ECG shows patient does not have tachycardia or S1Q3T3. However, CTA was positive for mild PE in the the RLL.  Previous CTA 2012 positive for saddle PE. Patient remains non-hypoxic on room air and denies dyspnea. Given description of chest discomfort, ACS was evaluated for but had negative troponin or ischemia on ECG. Given description of mild orthopnea over the past week, could consider CHF as contributor but chest xray negative for effusion or edema and negative for LE edema. BNP 21.1.  Because she is compliant with her Warfarin and INR is therapeutic at 2.3, a possible cause of her resistance to anticoagulants could be a neoplasm. Patient UTD colonoscopy negative for malignancy and last mammogram negative but is due for repeat. Had hysterectomy. She denies weight loss but endorses some night sweats. Another risk factor for this explanation is a first-degree relative with leukemia. Also concerning for a multiple myeloma spectrum disease as contributing to hypercoagulable state is an elevated serum protein (8.5) and low albumin (3.4) but less likely as she doesn't strongly embody "CRAB" criteria and those labs are just marginally outside of normal values.  Obesity and AA are risk factors. Patient negative for other hypercoagulable risk factors ie no estrogen supplementation/smoking/obvious infection/no known family or personal history of clotting disorder. Uncertain if coagulation studies have been done in the past. Additionally, patient endorses history of GERD and improvement of chest discomfort with use of gas-x so low suspicion that symptom is related to reflux. Patient started on lovenox treatment dose in ED. -Admit to Telemed, Attending Dr. Owens Shark  -Continue Lovenox 135 mg daily, per pharm consult -Discontinue Warfarin, consider DOAC -Cardiac monitoring -24hr urine protein UPEP - SPEP - light chains - consult heme/onc am, appreciate recs - CBC with diff - repeat ECG - repeat BMP am to verify protein values - protonix  Diabetes- Diet Controlled- Last HbA1C 6.1 on 11/11/2018.  CBG 102 today - Average carb diet - Monitor CBG with am labs  HTN- chronic pt states normally well controlled with home med Amlodipine '5mg'$  daily On admission BP 179/66  -continue Amlodipine 5 mg daily -monitor BP  AKI on CKDIII- Baseline Cr 1.31-1.40, on admission Cr 1.30 -continue to monitor, BMP am - avoid nephrotoxic agents   FEN/GI:  regular diet Prophylaxis: Lovenox  Disposition: TeleMed, Attending Dr. Owens Shark  History of Present Illness:  [name] is a 73 y.o. female presenting with what she describes as chest pressure gradual onset 5 days ago. Substernal, non-radiating chest "pulling", similar to previous PEs.  Felt like she needed to belch and thought she was having heartburn and took Gas X with some relief. She reports some shortness of breath when walking but no more than usual.  She has stated that she has been mostly sedentary for the last few months as her working situation has cut down.  She reports that her exercise has decreased as she used to walk to work every day but that has decreased.  She is taking warfarin 1.'3mg'$  on Monday and  Thursdays then 1 mg po Sunday,Tuesday, Friday and Saturday for a previous PE and states that she has not missed any doses.  She denies any nausea and vomiting.  Denies any hematemesis, hematuria, or bloody stool. She reports having a headache but relates it to her hunger. Denies any blurry vision, blackouts. She has had some wheezing, but no h/o asthma. atributes it to allergies-environmental.  She reports no leg pain or swelling but does recall having some acute pain in her right groin area this week.   Review Of Systems: Per HPI with the following additions:   Review of Systems  Constitutional: Negative for chills, diaphoresis and fever.  HENT: Negative for nosebleeds.   Eyes: Negative for blurred vision.  Respiratory: Positive for shortness of breath and wheezing. Negative for cough, hemoptysis and sputum production.   Cardiovascular: Positive for chest pain. Negative for palpitations, orthopnea, claudication and leg swelling.  Gastrointestinal: Positive for heartburn. Negative for abdominal pain, blood in stool, constipation, diarrhea, nausea and vomiting.  Genitourinary: Negative for dysuria, frequency, hematuria and urgency.  Musculoskeletal: Negative for myalgias.  Neurological: Negative for dizziness, weakness and headaches.  Endo/Heme/Allergies: Positive for environmental allergies. Does not bruise/bleed easily.  Psychiatric/Behavioral: Positive for hallucinations. Negative for substance abuse.    Patient Active Problem List   Diagnosis Date Noted  . Pulmonary emboli ([place]) 03/03/2019  . Chronic left hip pain 08/17/2018  . Leg swelling 07/22/2018  . Left sided numbness 07/22/2018  . Onychogryphosis 05/31/2018  . Tinea pedis of right foot 05/31/2018  . Low back pain potentially associated with radiculopathy 01/29/2018  . Acute sinus infection 10/29/2017  . Diverticulosis of colon 03/24/2017  . Internal hemorrhoid 03/24/2017  . Hypertensive retinopathy of right eye 01/20/2017  .  Hypermetropia of both eyes 01/20/2017  . Presbyopia 01/20/2017  . Back pain 01/05/2017  . Osteopenia 05/28/2016  . Chronic kidney disease 04/29/2016  . Normocytic anemia 04/29/2016  . Dark stools 10/19/2015  . Rotator cuff dysfunction 06/25/2015  . Thigh cramp 06/25/2015  . Long term (current) use of anticoagulants 05/08/2011  . HYPERLIPIDEMIA 08/08/2008  . Type 2 diabetes mellitus with hemoglobin A1c goal of less than 7.5% ([place]) 06/05/2008  . HYPERTENSION, BENIGN ESSENTIAL 11/27/2006  . OBESITY, NOS 10/08/2006  . GASTROESOPHAGEAL REFLUX, NO ESOPHAGITIS 10/08/2006  . Osteoarthritis, multiple sites 10/08/2006    Past Medical History: Past Medical History:  Diagnosis Date  . Allergy   . Anemia, iron deficiency   . ANXIETY, SITUATIONAL 04/06/2009   Qualifier: Diagnosis of  By: [name]  MD, Colletta Maryland    . Arthritis   . BACK PAIN W/RADIATION, UNSPECIFIED 10/08/2006   Qualifier: Diagnosis of  By: [name]    . Back pain, thoracic 12/20/2012  . Carpal tunnel syndrome 12/07/2014  . Cholelithiasis 06/25/2012  . Chronic kidney disease   . Diabetes mellitus   . DM type 2 goal A1C below 7.5 06/05/2008   Qualifier: Diagnosis of  By: [name]  MD, Colletta Maryland    . GASTROESOPHAGEAL REFLUX, NO ESOPHAGITIS 10/08/2006   Qualifier: Diagnosis of  By: [name]    . GERD (gastroesophageal reflux disease)   . Goiter, unspecified 10/21/2007   Qualifier: Diagnosis of  By: [name] MD, [name]    . History of DVT (deep vein thrombosis) 05/05/2013  . Hx of pulmonary embolus 05/08/2011  . HYPERLIPIDEMIA  08/08/2008   Qualifier: Diagnosis of  By: [name]  MD, Colletta Maryland    . Hypertension   . HYPERTENSION, BENIGN ESSENTIAL 11/27/2006   Qualifier: Diagnosis of  By: [name] MD, [place]    . Left tennis elbow 11/02/2014  . Long term (current) use of anticoagulants 05/08/2011  . Palpitations 10/01/2012  . Personal history of colonic polyps 10/24/2009   Qualifier: Diagnosis of  By: [name] MD, Sandy Salaam   . Pica 08/08/2009    Qualifier: Diagnosis of  By: [name], [name]    . Polymyalgia rheumatica ([place]) 10/02/2009   Qualifier: Diagnosis of  By: [name] MD, Apolonio Schneiders    . Pulmonary embolism ([place]) 04/28/11  . RENAL FAILURE 10/18/2009   Qualifier: History of  By: [name]  MD, Colletta Maryland    . Thyroid disease   . Trigger ring finger of left hand 11/02/2014  . UNSPECIFIED IRON DEFICIENCY ANEMIA 10/02/2009   Qualifier: Diagnosis of  By: [name] MD, Apolonio Schneiders      Past Surgical History: Past Surgical History:  Procedure Laterality Date  . ABDOMINAL HYSTERECTOMY    . CARDIAC CATHETERIZATION  04/29/11   Clean cardiac cath, no blockages.   . CARPAL TUNNEL RELEASE Right 1996  . KNEE ARTHROSCOPY  Bilateral  . TUBAL LIGATION      Social History: Social History   Tobacco Use  . Smoking status: Never Smoker  . Smokeless tobacco: Never Used  Substance Use Topics  . Alcohol use: No    Alcohol/week: 0.0 standard drinks  . Drug use: No   Additional social history: Lives alone Please also refer to relevant sections of EMR.  Family History: Family History  Problem Relation Age of Onset  . Diabetes Mother   . Heart disease Mother   . Hypertension Mother   . Heart disease Father   . Hypertension Father   . Diabetes Sister   . Heart disease Sister   . Hypertension Brother   . Cancer Sister 19       lukemia  . Cirrhosis Sister        alcohol  . Kidney disease Sister   . Diabetes Sister   . Diabetes Brother   . Colon cancer Neg Hx     Allergies and Medications: Allergies  Allergen Reactions  . Lisinopril Cough   No current facility-administered medications on file prior to encounter.    Current Outpatient Medications on File Prior to Encounter  Medication Sig Dispense Refill  . amLODipine (NORVASC) 10 MG tablet Take 1 tablet (10 mg total) by mouth daily. (Patient taking differently: Take 5 mg by mouth daily. ) 30 tablet 1  . atorvastatin (LIPITOR) 40 MG tablet Take 1 tablet (40 mg total) by mouth daily. (Patient  taking differently: Take 40 mg by mouth every Tuesday, Thursday, and Saturday at 6 PM. ) 90 tablet 3  . baclofen (LIORESAL) 10 MG tablet Take 1 tablet (10 mg total) by mouth daily as needed for muscle spasms. 30 each 0  . calcium-vitamin D (OSCAL-500) 500-400 MG-UNIT tablet Take 1 tablet by mouth 2 (two) times daily. 60 tablet 0  . diclofenac sodium (VOLTAREN) 1 % GEL Apply 2 g topically 4 (four) times daily. (Patient taking differently: Apply 2 g topically 4 (four) times daily. Back)    . fish oil-omega-3 fatty acids 1000 MG capsule Take 1,200 mg by mouth daily.     [name] gabapentin (NEURONTIN) 100 MG capsule Take 1 capsule (100 mg total) by mouth 3 (three) times daily. (Patient taking differently: Take  100 mg by mouth as needed (pain in hip). ) 90 capsule 3  . iron polysaccharides (FERREX 150) 150 MG capsule Take 1 capsule (150 mg total) by mouth daily. 60 capsule 5  . ketoconazole (NIZORAL) 2 % cream APPLY TO AFFECTED AREA TWICE A DAY (Patient taking differently: Apply 1 application topically daily as needed (Rash). ) 60 g 0  . losartan (COZAAR) 25 MG tablet TAKE 1 TABLET BY MOUTH ONCE DAILY (Patient taking differently: Take 25 mg by mouth daily. ) 90 tablet 3  . metoprolol tartrate (LOPRESSOR) 25 MG tablet Take 1 tablet by mouth twice daily (Patient taking differently: Take 25 mg by mouth 2 (two) times daily. ) 180 tablet 0  . omeprazole (PRILOSEC) 20 MG capsule Take 20 mg by mouth as needed (Heartburn). Reported on 12/17/2015    . polyethylene glycol powder (GLYCOLAX/MIRALAX) powder Take 17 g by mouth 2 (two) times daily as needed. 3350 g 1  . triamcinolone cream (KENALOG) 0.1 % apply to affected area twice a day (Patient taking differently: Apply 1 application topically as needed ([place]). ) 30 g 2  . warfarin (COUMADIN) 5 MG tablet Take as directed by coumadin clinic. (Patient taking differently: Take 5 mg by mouth See admin instructions. Take 7.5 mg on Monday and Thursday in the evening Take 5 mg  all the other days) 120 tablet 0  . amoxicillin (AMOXIL) 875 MG tablet Take 1 tablet (875 mg total) by mouth 2 (two) times daily. (Patient not taking: Reported on 03/03/2019) 20 tablet 0  . Blood Glucose Monitoring Suppl (ONETOUCH VERIO) w/Device KIT Use as instructed to test sugars once daily.  Dx Code: E11.9 1 kit 0  . glucose blood (ONETOUCH VERIO) test strip Use as instructed to test sugars once daily.  Dx Code: E11.9 100 each 12  . glucose blood test strip Use as instructed 100 each 12  . Lancet Devices (ONE TOUCH DELICA LANCING DEV) MISC Use as instructed to test sugars once daily.  Dx Code: E11.9 1 each 0  . OneTouch Delica Lancets 48O MISC Use as instructed to test sugars once daily.  Dx Code: E11.9 100 each 12  . predniSONE (DELTASONE) 20 MG tablet Take 1 tablet (20 mg total) by mouth 2 (two) times daily with a meal. (Patient not taking: Reported on 03/03/2019) 10 tablet 0  . simethicone (MYLICON) 707 MG chewable tablet Chew 1 tablet (125 mg total) by mouth every 6 (six) hours as needed for flatulence. (Patient not taking: Reported on 03/03/2019) 30 tablet 0  . traMADol (ULTRAM) 50 MG tablet Take 1 tablet (50 mg total) by mouth every 12 (twelve) hours as needed. (Patient not taking: Reported on 03/03/2019) 60 tablet 0    Objective: BP (!) 159/55   Pulse (!) 58   Temp 99 F (37.2 C)   Resp 17   Ht '5\' 9"'$  (1.753 m)   Wt 134.7 kg   SpO2 100%   BMI 43.86 kg/m  Exam: General: Alert, pleasant and oriented, no apparent distress  Eyes: PEERLA ENTM: No pharyengeal erythema Neck: nontender, negative thyromegaly or cervical lymphadenopthy Cardiovascular: RRR with no murmurs noted. Negative LE edema. Negative tenderness or swelling to palpation of lower extremities from groin to feet. There was strong pedal pulses. Respiratory: CTA bilaterally  Gastrointestinal: Bowel sounds present. No abdominal pain, obese MSK: Upper extremity strength 5/5 bilaterally, Lower extremity strength 5/5  bilaterally  Derm: No rashes noted Neuro: A&Ox4, no focal deficits Psych: Behavior and speech appropriate to situation  Labs and Imaging: CBC BMET  Recent Labs  Lab 03/03/19 0951  WBC 4.4  HGB 11.3*  HCT 35.6*  PLT 205   Recent Labs  Lab 03/03/19 0951  NA 138  K 4.0  CL 108  CO2 22  BUN 14  CREATININE 1.30*  GLUCOSE 102*  CALCIUM 9.3     EKG: Sinus rhythm Probable left atrial enlargement Low voltage, precordial leads No acute changes No significant change since last tracing Confirmed by Varney Biles [phone]) on 03/03/2019 11:04:57 AM Also confirmed by Varney Biles [phone]), editor Philomena Doheny [phone]) on 03/03/2019 11:08:13 AM  Ct Angio Chest Pe W/cm &/or Wo Cm  Result Date: 03/03/2019 CLINICAL DATA:  Chest pressure for 2 days, history of prior pulmonary embolism EXAM: CT ANGIOGRAPHY CHEST WITH CONTRAST TECHNIQUE: Multidetector CT imaging of the chest was performed using the standard protocol during bolus administration of intravenous contrast. Multiplanar CT image reconstructions and MIPs were obtained to evaluate the vascular anatomy. CONTRAST:  21m OMNIPAQUE IOHEXOL 350 MG/ML SOLN COMPARISON:  04/30/2011 FINDINGS: Cardiovascular: Thoracic aorta demonstrates atherosclerotic calcifications without aneurysmal dilatation or evidence of dissection. Heart is mildly enlarged in size, stable in appearance. No significant coronary calcifications are seen. The pulmonary artery shows a normal branching pattern bilaterally. Filling defects are noted in the right lower lobe pulmonary arterial branch consistent with mild pulmonary emboli. The overall embolus burden is significantly less than that seen on prior exam. No evidence of right heart strain is seen. Mediastinum/Nodes: The esophagus is within normal limits. No hilar or mediastinal adenopathy is seen. The thoracic inlet is within normal. Lungs/Pleura: The lungs are well aerated bilaterally. No focal infiltrate or sizable effusion  is seen. Focal parenchymal nodules are noted. Upper Abdomen: Visualized upper abdomen is within normal limits. Musculoskeletal: Degenerative changes of the thoracic spine are seen. No acute bony abnormality is noted. Review of the MIP images confirms the above findings. IMPRESSION: Mild pulmonary emboli in the right lower lobe without evidence of right heart strain. Aortic Atherosclerosis (ICD10-I70.0). Electronically Signed   By: MInez CatalinaM.D.   On: 03/03/2019 15:09   Dg Chest Port 1 View  Result Date: 03/03/2019 CLINICAL DATA:  Chest pain EXAM: PORTABLE CHEST 1 VIEW COMPARISON:  04/28/2011 FINDINGS: Mild cardiac enlargement. Negative for heart failure or edema. No infiltrate effusion or mass. Lungs well aerated. IMPRESSION: No active disease. Electronically Signed   By: CFranchot GalloM.D.   On: 03/03/2019 10:31    ARicharda Osmond DO 03/03/2019, 4:07 PM PGY-2, [place] pager: [phone] text pages welcome

## 2019-03-03 NOTE — Progress Notes (Signed)
FMTS Attending Brief Note: Dorris Singh, MD  Personal pager:  [phone] [place] Service Pager:  [phone]  Patient seen and discussed with Dr. Ouida Sills and Volanda Napoleon. Patient seen at 1700.  In brief, Erin Coffey is a pleasant 73 year old woman with history significant for venous thromboembolic disease on warfarin, obesity, chronic kidney disease stage III and remote history of polymyalgia rheumatica  presenting with chest pain, similar to prior episodes of pulmonary emboli.  The patient was seen in clinic earlier today at which time I discussed her case with Dr. Grandville Silos. The patient reports her chest pain is improved.  She reports 4 to 5 days of chest pressure which worsens with exertion.  She denies dyspnea.  She denies lower extremity edema.  She denies pain radiating down her arm or up the neck.  She denies any issues with cost or taking her warfarin medication.  Review of systems negative for weight loss, positive for weight gain negative for rash, melena, hematochezia, abdominal pain abdominal fullness.   Pertinent medical history significant for history of both pulmonary embolism and venous thromboembolic disease.  She has also has a medical history significant for obesity.  She is up-to-date on her colonoscopy which showed only diverticulosis with hemorrhoids due to thousand 14.  She is due for mammogram last of which was in 2018.  She had a hysterectomy for benign reasons.  She is a non-smoker.  She does not drink alcohol she lives alone.  She denies a family history of embolic disease.  She reports a  family history of breast cancer and chart review shows her sister had leukemia.  Vital signs are notable for hypertension.  Her cough is rather small in the emergency department.  She is breathing comfortably on room air.  Cardiac exam S1-S2 are normal regular rate and rhythm.  Her S2 is regular.  Lungs clear bilaterally.  Abdomen soft, nontender nondistended.  Lower extremities with trace to 1+  edema which is symmetric.   CTPA notable for right lower lobe pulmonary emboli. EKG reviewed which shows sinus rhythm, with significant artifact.  Labs are notable for normocytic anemia she has been present.  Metabolic panel notable for baseline creatinine elevated serum protein and low albumin. BNP and troponins are both negative. INR 2.3, therapeutic.   1. Acute pulmonary embolism, with symptoms while on warfarin. .  The patient has been therapeutic with no warfarin both on today's check and on her last INR check on 9 July.  She denies any difficulties with taking her medications.  Her embolism on warfarin therapy is concerning for an underlying malignancy and may necessitate the transition to an alternative.  I discussed this with her. Will discuss  with Hematology, follows with Dr. Irene Limbo.  Her weight makes using a DOAC less preferable, but as she has now failed warfarin therapy would consider edoxaban or apixaban. 2. The patient can have age-appropriate cancer screening.  She is up-to-date on her colonoscopy.  Will schedule for outpatient mammogram.  Additionally, will evaluate for myeloma given her elevated serum protein in setting of low albumin, normocytic anemia, and elevated creatinine.

## 2019-03-03 NOTE — Discharge Summary (Signed)
[facility] Discharge Summary  Patient name: Erin Coffey Medical record number: [id number] Date of birth: 1946-03-18 Age: 73 y.o. Gender: female Date of Admission: 03/03/2019  Date of Discharge: 03/04/2019 Admitting Physician: Martyn Malay, MD  Primary Care Provider: Bonnita Hollow, MD Consultants: Curbside Heme/Onc  Indication for Hospitalization: PE  Discharge Diagnoses/Problem List:  Mild PE while compliant with warfarin DM HTN AKI on CKD  Disposition: Home  Discharge Condition:stable  Discharge Exam:  General: NAD, pleasant, able to participate in exam Cardiac: RRR, normal heart sounds, no murmurs. 2+ radial and PT pulses bilaterally Respiratory: CTAB, normal effort, No wheezes, rales or rhonchi Abdomen: soft, nontender, nondistended, no hepatic or splenomegaly, +BS Extremities: no edema or cyanosis. Skin: warm and dry, no rashes noted Neuro: alert and oriented x4, no focal deficits Psych: Normal affect and mood  Brief Hospital Course:  Patient presented directly from clinic after found to have chest discomfort similar to previous PE. She arrived to ED ORA without dyspnea or hypoxia and only mild chest discomfort. CTA showed mild PE even though D-dimer was negative and INR was 2.4. patient was started on treatment dose of lovenox. Hypercoagulable ddx evaluation initiated.    On 7/24 patient firmly requested discharge home to family, workup was still pending but patient was stable so she was discharged with close followup.  Issues for Follow Up:  1. Heme/Onc suggested verbally to change anticoagulation to Eliquis, this was ordered on D/C, please ensure patient has this mediciation and is taking appropriately.  They were initially ordered a starter pack and will need refills of their running dose. 2. Multiple labs were drawn to evaluate for potential malignancy/MM as potential etioology of PE while anticoagulated.  Please check status  of these labs.  Significant Procedures: none  Significant Labs and Imaging:  Recent Labs  Lab 03/03/19 0951 03/04/19 0820  WBC 4.4 4.1  HGB 11.3* 10.8*  HCT 35.6* 32.9*  PLT 205 172   Recent Labs  Lab 03/03/19 0951 03/04/19 0820  NA 138 136  K 4.0 3.9  CL 108 106  CO2 22 21*  GLUCOSE 102* 177*  BUN 14 11  CREATININE 1.30* 1.33*  CALCIUM 9.3 9.1  ALKPHOS 66 59  AST 28 26  ALT 18 17  ALBUMIN 3.4* 3.2*      Results/Tests Pending at Time of Discharge: Multiple Myeloma/cancer workup as potential etiology of PE while anticoagulated  Discharge Medications:  Allergies as of 03/04/2019      Reactions   Lisinopril Cough      Medication List    STOP taking these medications   amoxicillin 875 MG tablet Commonly known as: AMOXIL   omeprazole 20 MG capsule Commonly known as: PRILOSEC   predniSONE 20 MG tablet Commonly known as: DELTASONE   simethicone 125 MG chewable tablet Commonly known as: MYLICON   traMADol 50 MG tablet Commonly known as: ULTRAM   warfarin 5 MG tablet Commonly known as: COUMADIN     TAKE these medications   amLODipine 5 MG tablet Commonly known as: NORVASC Take 1 tablet (5 mg total) by mouth daily. What changed:   medication strength  how much to take   atorvastatin 40 MG tablet Commonly known as: LIPITOR Take 1 tablet (40 mg total) by mouth daily. What changed: when to take this   baclofen 10 MG tablet Commonly known as: LIORESAL Take 1 tablet (10 mg total) by mouth daily as needed for muscle spasms.   calcium-vitamin D  500-400 MG-UNIT tablet Commonly known as: OSCAL-500 Take 1 tablet by mouth 2 (two) times daily.   diclofenac sodium 1 % Gel Commonly known as: VOLTAREN Apply 2 g topically 4 (four) times daily. What changed: additional instructions   Eliquis DVT/PE Starter Pack 5 MG Tabs Take as directed on package: start with two-'5mg'$  tablets twice daily for 7 days. On day 8, switch to one-'5mg'$  tablet twice daily.    fish oil-omega-3 fatty acids 1000 MG capsule Take 1,200 mg by mouth daily.   gabapentin 100 MG capsule Commonly known as: NEURONTIN Take 1 capsule (100 mg total) by mouth 3 (three) times daily. What changed:   when to take this  reasons to take this   glucose blood test strip Use as instructed   OneTouch Verio test strip Generic drug: glucose blood Use as instructed to test sugars once daily.  Dx Code: E11.9   iron polysaccharides 150 MG capsule Commonly known as: Ferrex 150 Take 1 capsule (150 mg total) by mouth daily.   ketoconazole 2 % cream Commonly known as: NIZORAL APPLY TO AFFECTED AREA TWICE A DAY What changed:   how much to take  how to take this  when to take this  reasons to take this  additional instructions   losartan 25 MG tablet Commonly known as: COZAAR TAKE 1 TABLET BY MOUTH ONCE DAILY   metoprolol tartrate 25 MG tablet Commonly known as: LOPRESSOR Take 1 tablet by mouth twice daily What changed:   how much to take  how to take this  when to take this  additional instructions   ONE TOUCH DELICA LANCING DEV Misc Use as instructed to test sugars once daily.  Dx Code: L24.4   OneTouch Delica Lancets 01U Misc Use as instructed to test sugars once daily.  Dx Code: E11.9   OneTouch Verio w/Device Kit Use as instructed to test sugars once daily.  Dx Code: E11.9   polyethylene glycol powder 17 GM/SCOOP powder Commonly known as: GLYCOLAX/MIRALAX Take 17 g by mouth 2 (two) times daily as needed.   triamcinolone cream 0.1 % Commonly known as: KENALOG apply to affected area twice a day What changed:   how much to take  how to take this  when to take this  reasons to take this  additional instructions       Discharge Instructions: Please refer to Patient Instructions section of EMR for full details.  Patient was counseled important signs and symptoms that should prompt return to medical care, changes in medications, dietary  instructions, activity restrictions, and follow up appointments.   Follow-Up Appointments: Follow-up Information    Martyn Malay, MD. Go on 03/07/2019.   Specialty: Family Medicine Why: 10am Contact information: [place] [address] [phone]           Carollee Leitz, MD 03/05/2019, 4:39 PM PGY-1, [place]

## 2019-03-03 NOTE — ED Notes (Signed)
2W unable to take report at this time.

## 2019-03-03 NOTE — Patient Instructions (Addendum)
It was a pleasure to see you today! Thank you for choosing [facility] for your primary care. Erin Coffey was seen for check up and chest pain.   We are checking your cholesterol, blood count, and kidney function with blood and urine test.   3. For you blood pressure, we are increasing your amlodipine to 10 mg. Please come back in 1 month to check you blood pressure.   Best,  Marny Lowenstein, MD, MS FAMILY MEDICINE RESIDENT - PGY3 03/03/2019 8:54 AM

## 2019-03-03 NOTE — ED Notes (Signed)
ED TO INPATIENT HANDOFF REPORT  ED Nurse Name and Phone #:  Elmyra Ricks [phone]  S Name/Age/Gender Erin Coffey 73 y.o. female Room/Bed: 028C/028C  Code Status   Code Status: Not on file  Home/SNF/Other Home Patient oriented to: self, place, time and situation Is this baseline? Yes   Triage Complete: Triage complete  Chief Complaint CP; R/O PE  Triage Note Pt sent here by PCP for PE rule out. Pt reports she had some chest pressure that started about a week ago. Does have a history of a PE in 2012 and takes warfarin. Reports some shortness of breath with exertion but none at rest. Pt is alert and oriented upon ED arrival. Reports she did not take her blood pressure medication last evening.    Allergies Allergies  Allergen Reactions  . Lisinopril Cough    Level of Care/Admitting Diagnosis ED Disposition    ED Disposition Condition Comment   Admit  Hospital Area: [place] [[id number]]  Level of Care: Telemetry Medical [104]  Covid Evaluation: Asymptomatic Screening Protocol (No Symptoms)  Diagnosis: Pulmonary emboli [facility]) [[id number]]  Admitting Physician: Richarda Osmond [[id number]]  Attending Physician: Martyn Malay [[id number]]  PT Class (Do Not Modify): Observation [104]  PT Acc Code (Do Not Modify): Observation [10022]       B Medical/Surgery History Past Medical History:  Diagnosis Date  . Allergy   . Anemia, iron deficiency   . ANXIETY, SITUATIONAL 04/06/2009   Qualifier: Diagnosis of  By: [name]  MD, Colletta [place]    . Arthritis   . BACK PAIN W/RADIATION, UNSPECIFIED 10/08/2006   Qualifier: Diagnosis of  By: [name]    . Back pain, thoracic 12/20/2012  . Carpal tunnel syndrome 12/07/2014  . Cholelithiasis 06/25/2012  . Chronic kidney disease   . Diabetes mellitus   . DM type 2 goal A1C below 7.5 06/05/2008   Qualifier: Diagnosis of  By: [name]  MD, Colletta [place]    . GASTROESOPHAGEAL REFLUX, NO ESOPHAGITIS 10/08/2006   Qualifier: Diagnosis of   By: [name]    . GERD (gastroesophageal reflux disease)   . Goiter, unspecified 10/21/2007   Qualifier: Diagnosis of  By: [name] MD, [name]    . History of DVT (deep vein thrombosis) 05/05/2013  . Hx of pulmonary embolus 05/08/2011  . HYPERLIPIDEMIA 08/08/2008   Qualifier: Diagnosis of  By: [name]  MD, Colletta [place]    . Hypertension   . HYPERTENSION, BENIGN ESSENTIAL 11/27/2006   Qualifier: Diagnosis of  By: [name] MD, [place]    . Left tennis elbow 11/02/2014  . Long term (current) use of anticoagulants 05/08/2011  . Palpitations 10/01/2012  . Personal history of colonic polyps 10/24/2009   Qualifier: Diagnosis of  By: [name] MD, Sandy Salaam   . Pica 08/08/2009   Qualifier: Diagnosis of  By: [name], [name]    . Polymyalgia rheumatica ([place]) 10/02/2009   Qualifier: Diagnosis of  By: [name] MD, Apolonio Schneiders    . Pulmonary embolism ([place]) 04/28/11  . RENAL FAILURE 10/18/2009   Qualifier: History of  By: [name]  MD, Colletta [place]    . Thyroid disease   . Trigger ring finger of left hand 11/02/2014  . UNSPECIFIED IRON DEFICIENCY ANEMIA 10/02/2009   Qualifier: Diagnosis of  By: [name] MD, Apolonio Schneiders     Past Surgical History:  Procedure Laterality Date  . ABDOMINAL HYSTERECTOMY    . CARDIAC CATHETERIZATION  04/29/11   Clean cardiac cath, no blockages.   . CARPAL TUNNEL RELEASE Right 1996  .  KNEE ARTHROSCOPY  Bilateral  . TUBAL LIGATION       A IV Location/Drains/Wounds Patient Lines/Drains/Airways Status   Active Line/Drains/Airways    Name:   Placement date:   Placement time:   Site:   Days:   Peripheral IV 03/03/19 Left Antecubital   03/03/19    0953    Antecubital   less than 1          Intake/Output Last 24 hours No intake or output data in the 24 hours ending 03/03/19 1628  Labs/Imaging Results for orders placed or performed during the hospital encounter of 03/03/19 (from the past 48 hour(s))  CBC     Status: Abnormal   Collection Time: 03/03/19  9:51 AM  Result Value Ref Range   WBC 4.4 4.0 -  10.5 K/uL   RBC 3.91 3.87 - 5.11 MIL/uL   Hemoglobin 11.3 (L) 12.0 - 15.0 g/dL   HCT 35.6 (L) 36.0 - 46.0 %   MCV 91.0 80.0 - 100.0 fL   MCH 28.9 26.0 - 34.0 pg   MCHC 31.7 30.0 - 36.0 g/dL   RDW 15.8 (H) 11.5 - 15.5 %   Platelets 205 150 - 400 K/uL   nRBC 0.0 0.0 - 0.2 %    Comment: Performed at [facility], [address], [place], [place] [address]  Troponin I (High Sensitivity)     Status: None   Collection Time: 03/03/19  9:51 AM  Result Value Ref Range   Troponin I (High Sensitivity) 7 <18 ng/L    Comment: (NOTE) Elevated high sensitivity troponin I (hsTnI) values and significant  changes across serial measurements may suggest ACS but many other  chronic and acute conditions are known to elevate hsTnI results.  Refer to the "Links" section for chest pain algorithms and additional  guidance. Performed at [facility], [place] [address] [place], [place] [address]   Comprehensive metabolic panel     Status: Abnormal   Collection Time: 03/03/19  9:51 AM  Result Value Ref Range   Sodium 138 135 - 145 mmol/L   Potassium 4.0 3.5 - 5.1 mmol/L   Chloride 108 98 - 111 mmol/L   CO2 22 22 - 32 mmol/L   Glucose, Bld 102 (H) 70 - 99 mg/dL   BUN 14 8 - 23 mg/dL   Creatinine, Ser 1.30 (H) 0.44 - 1.00 mg/dL   Calcium 9.3 8.9 - 10.3 mg/dL   Total Protein 8.5 (H) 6.5 - 8.1 g/dL   Albumin 3.4 (L) 3.5 - 5.0 g/dL   AST 28 15 - 41 U/L   ALT 18 0 - 44 U/L   Alkaline Phosphatase 66 38 - 126 U/L   Total Bilirubin 0.8 0.3 - 1.2 mg/dL   GFR calc non Af Amer 41 (L) >60 mL/min   GFR calc Af Amer 48 (L) >60 mL/min   Anion gap 8 5 - 15    Comment: Performed at [facility] [address] [place], [place] [address]  Protime-INR     Status: Abnormal   Collection Time: 03/03/19  9:51 AM  Result Value Ref Range   Prothrombin Time 25.4 (H) 11.4 - 15.2 seconds   INR 2.3 (H) 0.8 - 1.2    Comment: (NOTE) INR goal varies based on device and disease states. Performed at [facility], [place] [address], [place], [place] [address]   Brain natriuretic peptide     Status: None   Collection Time:  03/03/19  9:51 AM  Result Value Ref Range   B Natriuretic Peptide 21.1 0.0 - 100.0 pg/mL    Comment: Performed at [facility] [address] [place], [place] [address]  Troponin I (High Sensitivity)     Status: None   Collection Time: 03/03/19 12:02 PM  Result Value Ref Range   Troponin I (High Sensitivity) 6 <18 ng/L    Comment: (NOTE) Elevated high sensitivity troponin I (hsTnI) values and significant  changes across serial measurements may suggest ACS but many other  chronic and acute conditions are known to elevate hsTnI results.  Refer to the "Links" section for chest pain algorithms and additional  guidance. Performed at [facility], [place] [address] [place], [place] [address]   CBG monitoring, ED     Status: None   Collection Time: 03/03/19  3:47 PM  Result Value Ref Range   Glucose-Capillary 76 70 - 99 mg/dL   Ct Angio Chest Pe W/cm &/or Wo Cm  Result Date: 03/03/2019 CLINICAL DATA:  Chest pressure for 2 days, history of prior pulmonary embolism EXAM: CT ANGIOGRAPHY CHEST WITH CONTRAST TECHNIQUE: Multidetector CT imaging of the chest was performed using the standard protocol during bolus administration of intravenous contrast. Multiplanar CT image reconstructions and MIPs were obtained to evaluate the vascular anatomy. CONTRAST:  76mL OMNIPAQUE IOHEXOL 350 MG/ML SOLN COMPARISON:  04/30/2011 FINDINGS: Cardiovascular: Thoracic aorta demonstrates atherosclerotic calcifications without aneurysmal dilatation or evidence of dissection. Heart is mildly enlarged in size, stable in appearance. No significant coronary calcifications are seen. The pulmonary artery shows a normal branching pattern bilaterally. Filling defects are noted in the right lower lobe pulmonary arterial branch consistent with mild pulmonary emboli. The overall embolus burden is  significantly less than that seen on prior exam. No evidence of right heart strain is seen. Mediastinum/Nodes: The esophagus is within normal limits. No hilar or mediastinal adenopathy is seen. The thoracic inlet is within normal. Lungs/Pleura: The lungs are well aerated bilaterally. No focal infiltrate or sizable effusion is seen. Focal parenchymal nodules are noted. Upper Abdomen: Visualized upper abdomen is within normal limits. Musculoskeletal: Degenerative changes of the thoracic spine are seen. No acute bony abnormality is noted. Review of the MIP images confirms the above findings. IMPRESSION: Mild pulmonary emboli in the right lower lobe without evidence of right heart strain. Aortic Atherosclerosis (ICD10-I70.0). Electronically Signed   By: Inez Catalina M.D.   On: 03/03/2019 15:09   Dg Chest Port 1 View  Result Date: 03/03/2019 CLINICAL DATA:  Chest pain EXAM: PORTABLE CHEST 1 VIEW COMPARISON:  04/28/2011 FINDINGS: Mild cardiac enlargement. Negative for heart failure or edema. No infiltrate effusion or mass. Lungs well aerated. IMPRESSION: No active disease. Electronically Signed   By: Franchot Gallo M.D.   On: 03/03/2019 10:31    Pending Labs Unresulted Labs (From admission, onward)    Start     Ordered   03/03/19 1527  SARS Coronavirus 2 (CEPHEID - Performed in [place] hospital lab), Hosp Order  (Asymptomatic Patients Labs)  Once,   STAT    Question:  Rule Out  Answer:  Yes   03/03/19 1527          Vitals/Pain Today's Vitals   03/03/19 1300 03/03/19 1326 03/03/19 1400 03/03/19 1515  BP: (!) 152/56 (!) 154/70 (!) 149/63 (!) 159/55  Pulse: (!) 56 (!) 56 (!) 59 (!) 58  Resp: 16 15 18 17   Temp:      SpO2: 100% 100%  100% 100%  Weight:      Height:      PainSc:        Isolation Precautions No active isolations  Medications Medications  enoxaparin (LOVENOX) injection 135 mg (135 mg Subcutaneous Given 03/03/19 1553)  sodium chloride 0.9 % bolus 500 mL (500 mLs Intravenous  New Bag/Given 03/03/19 1458)  iohexol (OMNIPAQUE) 350 MG/ML injection 85 mL (85 mLs Intravenous Contrast Given 03/03/19 1446)    Mobility walks Low fall risk   Focused Assessments Pulmonary Assessment Handoff:  Lung sounds:   O2 Device: Room Air        R Recommendations: See Admitting Provider Note  Report given to:   Additional Notes:

## 2019-03-03 NOTE — Plan of Care (Signed)
  Problem: Health Behavior/Discharge Planning: Goal: Ability to manage health-related needs will improve Outcome: Progressing   Problem: Nutrition: Goal: Adequate nutrition will be maintained Outcome: Progressing   Problem: Coping: Goal: Level of anxiety will decrease Outcome: Progressing   Problem: Pain Managment: Goal: General experience of comfort will improve Outcome: Progressing   

## 2019-03-04 LAB — MICROALBUMIN / CREATININE URINE RATIO
Creatinine, Urine: 50.3 mg/dL
Microalb/Creat Ratio: 232 mg/g creat — ABNORMAL HIGH (ref 0–29)
Microalbumin, Urine: 116.6 ug/mL

## 2019-03-04 LAB — COMPREHENSIVE METABOLIC PANEL
ALT: 17 U/L (ref 0–44)
AST: 26 U/L (ref 15–41)
Albumin: 3.2 g/dL — ABNORMAL LOW (ref 3.5–5.0)
Alkaline Phosphatase: 59 U/L (ref 38–126)
Anion gap: 9 (ref 5–15)
BUN: 11 mg/dL (ref 8–23)
CO2: 21 mmol/L — ABNORMAL LOW (ref 22–32)
Calcium: 9.1 mg/dL (ref 8.9–10.3)
Chloride: 106 mmol/L (ref 98–111)
Creatinine, Ser: 1.33 mg/dL — ABNORMAL HIGH (ref 0.44–1.00)
GFR calc Af Amer: 46 mL/min — ABNORMAL LOW (ref >60)
GFR calc non Af Amer: 40 mL/min — ABNORMAL LOW (ref >60)
Glucose, Bld: 177 mg/dL — ABNORMAL HIGH (ref 70–99)
Potassium: 3.9 mmol/L (ref 3.5–5.1)
Sodium: 136 mmol/L (ref 135–145)
Total Bilirubin: 0.8 mg/dL (ref 0.3–1.2)
Total Protein: 7.9 g/dL (ref 6.5–8.1)

## 2019-03-04 LAB — CBC WITH DIFFERENTIAL/PLATELET
Abs Immature Granulocytes: 0.01 10*3/uL (ref 0.00–0.07)
Basophils Absolute: 0 10*3/uL (ref 0.0–0.1)
Basophils Relative: 0 %
Eosinophils Absolute: 0.2 10*3/uL (ref 0.0–0.5)
Eosinophils Relative: 4 %
HCT: 32.9 % — ABNORMAL LOW (ref 36.0–46.0)
Hemoglobin: 10.8 g/dL — ABNORMAL LOW (ref 12.0–15.0)
Immature Granulocytes: 0 %
Lymphocytes Relative: 33 %
Lymphs Abs: 1.3 10*3/uL (ref 0.7–4.0)
MCH: 29.3 pg (ref 26.0–34.0)
MCHC: 32.8 g/dL (ref 30.0–36.0)
MCV: 89.2 fL (ref 80.0–100.0)
Monocytes Absolute: 0.5 10*3/uL (ref 0.1–1.0)
Monocytes Relative: 11 %
Neutro Abs: 2.1 10*3/uL (ref 1.7–7.7)
Neutrophils Relative %: 52 %
Platelets: 172 10*3/uL (ref 150–400)
RBC: 3.69 MIL/uL — ABNORMAL LOW (ref 3.87–5.11)
RDW: 15.8 % — ABNORMAL HIGH (ref 11.5–15.5)
WBC: 4.1 10*3/uL (ref 4.0–10.5)
nRBC: 0 % (ref 0.0–0.2)

## 2019-03-04 LAB — PROTIME-INR
INR: 2.6 — ABNORMAL HIGH (ref 0.8–1.2)
Prothrombin Time: 27.3 seconds — ABNORMAL HIGH (ref 11.4–15.2)

## 2019-03-04 MED ORDER — ELIQUIS 5 MG VTE STARTER PACK: ORAL_TABLET | ORAL | 0 refills | Status: DC

## 2019-03-04 MED ORDER — METOPROLOL TARTRATE 25 MG PO TABS
25.0000 mg | ORAL_TABLET | Freq: Two times a day (BID) | ORAL | Status: DC
  Administered 2019-03-04: 25 mg via ORAL
  Filled 2019-03-04: qty 1

## 2019-03-04 MED ORDER — AMLODIPINE BESYLATE 5 MG PO TABS: 5.0000 mg | ORAL_TABLET | Freq: Every day | ORAL | 0 refills | Status: DC

## 2019-03-04 MED FILL — ELIQUIS STARTER PACK 5 MG T: 5 | 30 days supply | Qty: 74 | Fill #0

## 2019-03-04 NOTE — Progress Notes (Addendum)
[facility] pharmacy filled the eliquis for patient and 30 day was given to patient.  Patient was dc beforec benefit check could be done. NCM was not consulted for Eliquis.

## 2019-03-04 NOTE — Progress Notes (Signed)
Patient instructed on AVS including medication changes and follow up appointments.  Patient did receive eliquis from pharmacy and had question regarding an interaction with one she was already taking.  Called pharmacy and answered questions for patient.  No further questions noted. PIV removed.  Tele removed and verified with ccmd.  Patient dressed and taken to personal car via wheelchair.  Daughter waiting to drive patient home.

## 2019-03-04 NOTE — Discharge Instructions (Addendum)
You have had your anticoagulation changed from warfarin to apixaban.  There is a change of doses while you get started on this new medication so read your instructions carefully and call your doctor with any questions. Please go to your followup appt with Dr. Owens Shark on Monday.  We have also changed your amlodipine dose.   Information on my medicine - ELIQUIS (apixaban)   Why was Eliquis prescribed for you? Eliquis was prescribed to treat blood clots that may have been found in the veins of your legs (deep vein thrombosis) or in your lungs (pulmonary embolism) and to reduce the risk of them occurring again.  What do You need to know about Eliquis ? The starting dose is 10 mg (two 5 mg tablets) taken TWICE daily for the FIRST SEVEN (7) DAYS, then  the dose is reduced to ONE 5 mg tablet taken TWICE daily.  Eliquis may be taken with or without food.   Try to take the dose about the same time in the morning and in the evening. If you have difficulty swallowing the tablet whole please discuss with your pharmacist how to take the medication safely.  Take Eliquis exactly as prescribed and DO NOT stop taking Eliquis without talking to the doctor who prescribed the medication.  Stopping may increase your risk of developing a new blood clot.  Refill your prescription before you run out.  After discharge, you should have regular check-up appointments with your healthcare provider that is prescribing your Eliquis.    What do you do if you miss a dose? If a dose of ELIQUIS is not taken at the scheduled time, take it as soon as possible on the same day and twice-daily administration should be resumed. The dose should not be doubled to make up for a missed dose.  Important Safety Information A possible side effect of Eliquis is bleeding. You should call your healthcare provider right away if you experience any of the following: ? Bleeding from an injury or your nose that does not stop. ? Unusual  colored urine (red or dark brown) or unusual colored stools (red or black). ? Unusual bruising for unknown reasons. ? A serious fall or if you hit your head (even if there is no bleeding).  Some medicines may interact with Eliquis and might increase your risk of bleeding or clotting while on Eliquis. To help avoid this, consult your healthcare provider or pharmacist prior to using any new prescription or non-prescription medications, including herbals, vitamins, non-steroidal anti-inflammatory drugs (NSAIDs) and supplements.  This website has more information on Eliquis (apixaban): http://www.eliquis.com/eliquis/home

## 2019-03-04 NOTE — Progress Notes (Addendum)
Family Medicine Teaching Service Daily Progress Note Intern Pager: [phone]  Patient name: Erin Coffey Medical record number: [id number] Date of birth: Feb 29, 1946 Age: 73 y.o. Gender: female  Primary Care Provider: Bonnita Hollow, MD Consultants: Heme/Onc Code Status: Full  Pt Overview and Major Events to Date:  07/23- Admitted  Assessment and Plan:  Erin Coffey is a 73 y.o. female presenting with chest discomfort. PMH is significant for Pulmonary Embolism, DVT, HTN, Diabetes, CKD  Chest discomfort 2/2 acute PE- stable No hypoxia, no shortness of breath or chest pressure today.  She is willing to start DOAC   -Continue Lovenox 135 mg daily, per pharm consult -Warfarin discontinued  -Cardiac monitoring - SPEP pending - light chains pending - consult heme/onc am, appreciate recs - protonix -Oncology will see pending workup  Diabetes-chronic stable Diet Controlled- Last HbA1C 6.1 on 11/11/2018.  CBG 76 yesterday - Average carb diet - Monitor CBG with am labs  HTN- chronic stable pt states normally well controlled with home med Amlodipine 5mg  daily BP 147/61 today,On admission BP 179/66  -continue Amlodipine 5 mg daily -monitor BP  AKI on CKDIII- Baseline Cr 1.31-1.40, on admission Cr 1.30 -continue to monitor -avoid nephrotoxic agents   FEN/GI: Regular Diet PPx: Lovenox  Disposition: Home when medical work up complete  Subjective:  Pt sitting in bed eating breakfast.  Feeling much better today, denies any chest pressure, shortness of breath, n/v.  Wanting to go home.    Objective: Temp:  [98 F (36.7 C)-99 F (37.2 C)] 98.5 F (36.9 C) (07/24 0810) Pulse Rate:  [55-98] 82 (07/24 0810) Resp:  [11-23] 19 (07/24 0810) BP: (131-179)/(51-81) 147/61 (07/24 0810) SpO2:  [100 %] 100 % (07/24 0810) Weight:  [133.7 kg-134.7 kg] 133.7 kg (07/24 0548) Physical Exam: General: NAD, pleasant, able to participate in exam Cardiac: RRR, normal heart  sounds, no murmurs. 2+ radial and PT pulses bilaterally Respiratory: CTAB, normal effort, No wheezes, rales or rhonchi Abdomen: soft, nontender, nondistended, no hepatic or splenomegaly, +BS Extremities: no edema or cyanosis. Skin: warm and dry, no rashes noted Neuro: alert and oriented x4, no focal deficits Psych: Normal affect and mood  Laboratory: Recent Labs  Lab 03/03/19 0951  WBC 4.4  HGB 11.3*  HCT 35.6*  PLT 205   Recent Labs  Lab 03/03/19 0951  NA 138  K 4.0  CL 108  CO2 22  BUN 14  CREATININE 1.30*  CALCIUM 9.3  PROT 8.5*  BILITOT 0.8  ALKPHOS 66  ALT 18  AST 28  GLUCOSE 102*      Imaging/Diagnostic Tests: Ct Angio Chest Pe W/cm &/or Wo Cm  Result Date: 03/03/2019 CLINICAL DATA:  Chest pressure for 2 days, history of prior pulmonary embolism EXAM: CT ANGIOGRAPHY CHEST WITH CONTRAST TECHNIQUE: Multidetector CT imaging of the chest was performed using the standard protocol during bolus administration of intravenous contrast. Multiplanar CT image reconstructions and MIPs were obtained to evaluate the vascular anatomy. CONTRAST:  65mL OMNIPAQUE IOHEXOL 350 MG/ML SOLN COMPARISON:  04/30/2011 FINDINGS: Cardiovascular: Thoracic aorta demonstrates atherosclerotic calcifications without aneurysmal dilatation or evidence of dissection. Heart is mildly enlarged in size, stable in appearance. No significant coronary calcifications are seen. The pulmonary artery shows a normal branching pattern bilaterally. Filling defects are noted in the right lower lobe pulmonary arterial branch consistent with mild pulmonary emboli. The overall embolus burden is significantly less than that seen on prior exam. No evidence of right heart strain is seen. Mediastinum/Nodes: The esophagus is  within normal limits. No hilar or mediastinal adenopathy is seen. The thoracic inlet is within normal. Lungs/Pleura: The lungs are well aerated bilaterally. No focal infiltrate or sizable effusion is seen.  Focal parenchymal nodules are noted. Upper Abdomen: Visualized upper abdomen is within normal limits. Musculoskeletal: Degenerative changes of the thoracic spine are seen. No acute bony abnormality is noted. Review of the MIP images confirms the above findings. IMPRESSION: Mild pulmonary emboli in the right lower lobe without evidence of right heart strain. Aortic Atherosclerosis (ICD10-I70.0). Electronically Signed   By: Inez Catalina M.D.   On: 03/03/2019 15:09   Dg Chest Port 1 View  Result Date: 03/03/2019 CLINICAL DATA:  Chest pain EXAM: PORTABLE CHEST 1 VIEW COMPARISON:  04/28/2011 FINDINGS: Mild cardiac enlargement. Negative for heart failure or edema. No infiltrate effusion or mass. Lungs well aerated. IMPRESSION: No active disease. Electronically Signed   By: Franchot Gallo M.D.   On: 03/03/2019 10:31    Carollee Leitz, MD 03/04/2019, 8:28 AM PGY-1, [place] [facility] Intern pager: [phone], text pages welcome

## 2019-03-04 NOTE — Plan of Care (Signed)
  Problem: Health Behavior/Discharge Planning: °Goal: Ability to manage health-related needs will improve °Outcome: Progressing °  °Problem: Nutrition: °Goal: Adequate nutrition will be maintained °Outcome: Progressing °  °Problem: Coping: °Goal: Level of anxiety will decrease °Outcome: Progressing °  °Problem: Elimination: °Goal: Will not experience complications related to bowel motility °Outcome: Progressing °  °Problem: Pain Managment: °Goal: General experience of comfort will improve °Outcome: Progressing °  °Problem: Safety: °Goal: Ability to remain free from injury will improve °Outcome: Progressing °  °Problem: Skin Integrity: °Goal: Risk for impaired skin integrity will decrease °Outcome: Progressing °  °

## 2019-03-05 ENCOUNTER — Encounter: Payer: Self-pay | Admitting: Family Medicine

## 2019-03-05 DIAGNOSIS — R809 Proteinuria, unspecified: Secondary | ICD-10-CM

## 2019-03-05 DIAGNOSIS — E1129 Type 2 diabetes mellitus with other diabetic kidney complication: Secondary | ICD-10-CM | POA: Insufficient documentation

## 2019-03-05 LAB — HIV ANTIBODY (ROUTINE TESTING W REFLEX): HIV Screen 4th Generation wRfx: NONREACTIVE

## 2019-03-05 NOTE — Addendum Note (Signed)
Addended by: Bonnita Hollow on: 03/05/2019 08:43 PM   Modules accepted: Level of Service

## 2019-03-06 NOTE — Progress Notes (Signed)
Subjective: Erin Coffey is a 73 y.o. y.o. female who is diabetic and on long term blood thinner, Coumadin. She presents today with painful, discolored, thick toenails  which interfere with daily activities. Pain is aggravated when wearing enclosed shoe gear. Pain is relieved with periodic professional debridement.  Patient's PCP is Bonnita Hollow, MD and last date seen was 03/03/2019.   Current Outpatient Medications:  .  amLODipine (NORVASC) 5 MG tablet, Take 1 tablet (5 mg total) by mouth daily., Disp: 30 tablet, Rfl: 0 .  atorvastatin (LIPITOR) 40 MG tablet, Take 1 tablet (40 mg total) by mouth daily. (Patient taking differently: Take 40 mg by mouth every Tuesday, Thursday, and Saturday at 6 PM. ), Disp: 90 tablet, Rfl: 3 .  baclofen (LIORESAL) 10 MG tablet, Take 1 tablet (10 mg total) by mouth daily as needed for muscle spasms., Disp: 30 each, Rfl: 0 .  Blood Glucose Monitoring Suppl (ONETOUCH VERIO) w/Device KIT, Use as instructed to test sugars once daily.  Dx Code: E11.9, Disp: 1 kit, Rfl: 0 .  calcium-vitamin D (OSCAL-500) 500-400 MG-UNIT tablet, Take 1 tablet by mouth 2 (two) times daily., Disp: 60 tablet, Rfl: 0 .  diclofenac sodium (VOLTAREN) 1 % GEL, Apply 2 g topically 4 (four) times daily. (Patient taking differently: Apply 2 g topically 4 (four) times daily. Back), Disp: , Rfl:  .  Eliquis DVT/PE Starter Pack (ELIQUIS STARTER PACK) 5 MG TABS, Take as directed on package: start with two-25m tablets twice daily for 7 days. On day 8, switch to one-563mtablet twice daily., Disp: 1 each, Rfl: 0 .  fish oil-omega-3 fatty acids 1000 MG capsule, Take 1,200 mg by mouth daily. , Disp: , Rfl:  .  gabapentin (NEURONTIN) 100 MG capsule, Take 1 capsule (100 mg total) by mouth 3 (three) times daily. (Patient taking differently: Take 100 mg by mouth as needed (pain in hip). ), Disp: 90 capsule, Rfl: 3 .  glucose blood (ONETOUCH VERIO) test strip, Use as instructed to test sugars once daily.   Dx Code: E11.9, Disp: 100 each, Rfl: 12 .  glucose blood test strip, Use as instructed, Disp: 100 each, Rfl: 12 .  iron polysaccharides (FERREX 150) 150 MG capsule, Take 1 capsule (150 mg total) by mouth daily., Disp: 60 capsule, Rfl: 5 .  ketoconazole (NIZORAL) 2 % cream, APPLY TO AFFECTED AREA TWICE A DAY (Patient taking differently: Apply 1 application topically daily as needed (Rash). ), Disp: 60 g, Rfl: 0 .  Lancet Devices (ONE TOUCH DELICA LANCING DEV) MISC, Use as instructed to test sugars once daily.  Dx Code: E11.9, Disp: 1 each, Rfl: 0 .  losartan (COZAAR) 25 MG tablet, TAKE 1 TABLET BY MOUTH ONCE DAILY (Patient taking differently: Take 25 mg by mouth daily. ), Disp: 90 tablet, Rfl: 3 .  metoprolol tartrate (LOPRESSOR) 25 MG tablet, Take 1 tablet by mouth twice daily (Patient taking differently: Take 25 mg by mouth 2 (two) times daily. ), Disp: 180 tablet, Rfl: 0 .  OneTouch Delica Lancets 3329HISC, Use as instructed to test sugars once daily.  Dx Code: E11.9, Disp: 100 each, Rfl: 12 .  polyethylene glycol powder (GLYCOLAX/MIRALAX) powder, Take 17 g by mouth 2 (two) times daily as needed., Disp: 3350 g, Rfl: 1 .  triamcinolone cream (KENALOG) 0.1 %, apply to affected area twice a day (Patient taking differently: Apply 1 application topically as needed ([place]). ), Disp: 30 g, Rfl: 2   Allergies  Allergen Reactions  .  Lisinopril Cough     Objective: Vascular Examination: Capillary refill time <3 seconds x 10 digits.  Dorsalis pedis pulses palpable b/l.  Posterior tibial pulses palpable b/l.  No digital hair x 10 digits.  Skin temperature gradient WNL b/l.  Dermatological Examination: Skin with normal turgor, texture and tone b/l.  Toenails 1-5 b/l discolored, thick, dystrophic with subungual debris and pain with palpation to nailbeds due to thickness of nails.  Musculoskeletal: Muscle strength 5/5 to all LE muscle groups.  No gross bony deformities b/l.  No pain,  crepitus or joint limitation noted b/l LE.  Neurological: Sensation intact with 10 gram monofilament.  Vibratory sensation intact.  Assessment: Painful onychomycosis toenails 1-5 b/l in patient on blood thinner.  NIDDM  Plan: 1. Continue diabetic foot care principles.  2. Toenails 1-5 b/l were debrided in length and girth without iatrogenic bleeding. 3. Patient to continue soft, supportive shoe gear daily. 4. Patient to report any pedal injuries to medical professional immediately. 5. Avoid self trimming due to use of blood thinner. 6. Follow up 3 months.  7. Patient/POA to call should there be a concern in the interim.

## 2019-03-07 ENCOUNTER — Ambulatory Visit (INDEPENDENT_AMBULATORY_CARE_PROVIDER_SITE_OTHER): Payer: Medicare Other | Admitting: Family Medicine

## 2019-03-07 ENCOUNTER — Other Ambulatory Visit: Payer: Self-pay

## 2019-03-07 VITALS — BP 142/80 | HR 61

## 2019-03-07 DIAGNOSIS — E785 Hyperlipidemia, unspecified: Secondary | ICD-10-CM

## 2019-03-07 DIAGNOSIS — I10 Essential (primary) hypertension: Secondary | ICD-10-CM

## 2019-03-07 DIAGNOSIS — M8088XA Other osteoporosis with current pathological fracture, vertebra(e), initial encounter for fracture: Secondary | ICD-10-CM | POA: Insufficient documentation

## 2019-03-07 DIAGNOSIS — K219 Gastro-esophageal reflux disease without esophagitis: Secondary | ICD-10-CM

## 2019-03-07 DIAGNOSIS — E119 Type 2 diabetes mellitus without complications: Secondary | ICD-10-CM

## 2019-03-07 DIAGNOSIS — R1013 Epigastric pain: Secondary | ICD-10-CM | POA: Diagnosis not present

## 2019-03-07 DIAGNOSIS — D6859 Other primary thrombophilia: Secondary | ICD-10-CM | POA: Diagnosis not present

## 2019-03-07 DIAGNOSIS — I2699 Other pulmonary embolism without acute cor pulmonale: Secondary | ICD-10-CM

## 2019-03-07 DIAGNOSIS — M8088XS Other osteoporosis with current pathological fracture, vertebra(e), sequela: Secondary | ICD-10-CM

## 2019-03-07 DIAGNOSIS — M159 Polyosteoarthritis, unspecified: Secondary | ICD-10-CM | POA: Diagnosis not present

## 2019-03-07 DIAGNOSIS — R779 Abnormality of plasma protein, unspecified: Secondary | ICD-10-CM

## 2019-03-07 LAB — PROTEIN ELECTROPHORESIS, SERUM
A/G Ratio: 0.8 (ref 0.7–1.7)
Albumin ELP: 3.3 g/dL (ref 2.9–4.4)
Alpha-1-Globulin: 0.2 g/dL (ref 0.0–0.4)
Alpha-2-Globulin: 0.8 g/dL (ref 0.4–1.0)
Beta Globulin: 1.4 g/dL — ABNORMAL HIGH (ref 0.7–1.3)
Gamma Globulin: 1.8 g/dL (ref 0.4–1.8)
Globulin, Total: 4.2 g/dL — ABNORMAL HIGH (ref 2.2–3.9)
Total Protein ELP: 7.5 g/dL (ref 6.0–8.5)

## 2019-03-07 MED ORDER — APIXABAN 5 MG PO TABS: 5.0000 mg | ORAL_TABLET | Freq: Two times a day (BID) | ORAL | 3 refills | Status: DC

## 2019-03-07 MED ORDER — AMLODIPINE BESYLATE 5 MG PO TABS: 5.0000 mg | ORAL_TABLET | Freq: Every day | ORAL | 3 refills | Status: DC

## 2019-03-07 MED ORDER — LOSARTAN POTASSIUM 25 MG PO TABS: 25.0000 mg | ORAL_TABLET | Freq: Every day | ORAL | 3 refills | Status: DC

## 2019-03-07 MED ORDER — BACLOFEN 10 MG PO TABS: 10.0000 mg | ORAL_TABLET | Freq: Every day | ORAL | 0 refills | Status: DC | PRN

## 2019-03-07 MED ORDER — FAMOTIDINE 20 MG PO TABS: 20.0000 mg | ORAL_TABLET | Freq: Two times a day (BID) | ORAL | 3 refills | Status: DC

## 2019-03-07 MED ORDER — GABAPENTIN 100 MG PO CAPS: 100.0000 mg | ORAL_CAPSULE | Freq: Three times a day (TID) | ORAL | 3 refills | Status: DC

## 2019-03-07 NOTE — Assessment & Plan Note (Signed)
Given patient's recent pulmonary embolism, lab abnormalities noted below and osteoporotic fracture on her prior MRI of the spine myeloma certainly does seem possible.  UPEP and SPEP are pending.  Will route note to primary care provider and recommend that the patient be considered for bisphosphonate therapy.

## 2019-03-07 NOTE — Progress Notes (Signed)
  Patient Name: Erin Coffey Date of Birth: [birth year] Date of Visit: 03/07/19 PCP: Bonnita Hollow, MD  Chief Complaint: follow up from hospital, discuss acid reflux   Subjective: Erin Coffey is a pleasant 73 y.o. with medical history significant for thoracic compression fracture (2019), pulmonary embolism on chronic anticoagulation, hypertension, degenerative disc disease, morbid obesity and dyslipidemia presenting today for checkup and hospital follow-up.  Pulmonary embolism The patient has a history of multiple pulmonary emboli.  She follows with Dr. Irene Limbo at [facility].  She is previously very well controlled on warfarin therapy.  She was recently admitted to the hospital for a recurrent right-sided pulmonary embolism.  Her INR was therapeutic during this time and in the days preceding this event.  She did not miss any doses.  She is transition to apixaban she is currently on the loading dose.  She denies any adverse side effects.  She denies easy bleeding or bruising, chest pain, dyspnea on exertion.  She reports she overall feels much better.  Acid reflux The patient reports she was taken off her PPI while she was in the hospital.  She reports some burning sensation in her upper abdomen.  No chest pain no dyspnea.  She feels otherwise well    ROS: Per HPI.   I have reviewed the patient's medical, surgical, family, and social history as appropriate.  Vitals:   03/07/19 1015  BP: (!) 142/80  Pulse: 61  SpO2: 100%   HEENT: Sclera anicteric. Dentition is moderate. Appears well hydrated. Neck: Supple Cardiac: Regular rate and rhythm. Normal S1/S2. No murmurs, rubs, or gallops appreciated. Lungs: Clear bilaterally to ascultation.  Abdomen: Obese abdomen Extremities: Warm, well perfused without edema.  Skin: Warm, dry Psych: Pleasant and appropriate     HYPERTENSION, BENIGN ESSENTIAL Discussed and recommended the patient take losartan at night and amlodipine at  night.  Her blood pressure is at goal today on repeat.  Repeat metabolic panel.  Pulmonary emboli ([place]) The patient is on her apixaban starter pack at this time.  I prescribed apixaban 5 mg twice daily to start once she runs out of the starter pack.  I reviewed bleeding precautions.  We discussed side effects at length.  Repeat CBC today  GASTROESOPHAGEAL REFLUX, NO ESOPHAGITIS Patient has stopped omeprazole.  Will start H2 blocker.  Osteoporotic compression fracture of spine ([place]) Given patient's recent pulmonary embolism, lab abnormalities noted below and osteoporotic fracture on her prior MRI of the spine myeloma certainly does seem possible.  UPEP and SPEP are pending.  Will route note to primary care provider and recommend that the patient be considered for bisphosphonate therapy.  Elevated serum protein level Urine and serum protein electrophoresis are pending.  Pending results will refer to hematology.  Given history of compression fracture, normocytic anemia, elevated serum protein will plan for referral back to hematology.     Return to care in 2 months, recommend discussion of bisphosphonate therapy at follow-up.   Erin Singh, MD  Family Medicine Teaching Service

## 2019-03-07 NOTE — Patient Instructions (Signed)
It was wonderful to see you today.  Thank you for choosing [facility].   Please call [phone] with any questions about today's appointment.  Please be sure to schedule follow up at the front  desk before you leave today.   Velva Molinari, MD  Family Medicine    

## 2019-03-07 NOTE — Assessment & Plan Note (Signed)
Discussed and recommended the patient take losartan at night and amlodipine at night.  Her blood pressure is at goal today on repeat.  Repeat metabolic panel.

## 2019-03-07 NOTE — Assessment & Plan Note (Signed)
Patient has stopped omeprazole.  Will start H2 blocker.

## 2019-03-07 NOTE — Assessment & Plan Note (Signed)
Urine and serum protein electrophoresis are pending.  Pending results will refer to hematology.  Given history of compression fracture, normocytic anemia, elevated serum protein will plan for referral back to hematology.

## 2019-03-07 NOTE — Assessment & Plan Note (Signed)
The patient is on her apixaban starter pack at this time.  I prescribed apixaban 5 mg twice daily to start once she runs out of the starter pack.  I reviewed bleeding precautions.  We discussed side effects at length.  Repeat CBC today

## 2019-03-08 LAB — COMPREHENSIVE METABOLIC PANEL
ALT: 89 IU/L — ABNORMAL HIGH (ref 0–32)
AST: 147 IU/L — ABNORMAL HIGH (ref 0–40)
Albumin/Globulin Ratio: 1.1 — ABNORMAL LOW (ref 1.2–2.2)
Albumin: 4 g/dL (ref 3.7–4.7)
Alkaline Phosphatase: 73 IU/L (ref 39–117)
BUN/Creatinine Ratio: 11 — ABNORMAL LOW (ref 12–28)
BUN: 13 mg/dL (ref 8–27)
Bilirubin Total: 0.6 mg/dL (ref 0.0–1.2)
CO2: 21 mmol/L (ref 20–29)
Calcium: 9.3 mg/dL (ref 8.7–10.3)
Chloride: 104 mmol/L (ref 96–106)
Creatinine, Ser: 1.2 mg/dL — ABNORMAL HIGH (ref 0.57–1.00)
GFR calc Af Amer: 53 mL/min/{1.73_m2} — ABNORMAL LOW (ref >59)
GFR calc non Af Amer: 46 mL/min/{1.73_m2} — ABNORMAL LOW (ref >59)
Globulin, Total: 3.7 g/dL (ref 1.5–4.5)
Glucose: 92 mg/dL (ref 65–99)
Potassium: 4.6 mmol/L (ref 3.5–5.2)
Sodium: 141 mmol/L (ref 134–144)
Total Protein: 7.7 g/dL (ref 6.0–8.5)

## 2019-03-08 LAB — CBC
Hematocrit: 33.5 % — ABNORMAL LOW (ref 34.0–46.6)
Hemoglobin: 11.1 g/dL (ref 11.1–15.9)
MCH: 29 pg (ref 26.6–33.0)
MCHC: 33.1 g/dL (ref 31.5–35.7)
MCV: 88 fL (ref 79–97)
Platelets: 203 10*3/uL (ref 150–450)
RBC: 3.83 x10E6/uL (ref 3.77–5.28)
RDW: 14.7 % (ref 11.7–15.4)
WBC: 4.8 10*3/uL (ref 3.4–10.8)

## 2019-03-08 LAB — LIPID PANEL
Chol/HDL Ratio: 2.1 ratio (ref 0.0–4.4)
Cholesterol, Total: 156 mg/dL (ref 100–199)
HDL: 75 mg/dL (ref >39)
LDL Calculated: 70 mg/dL (ref 0–99)
Triglycerides: 53 mg/dL (ref 0–149)
VLDL Cholesterol Cal: 11 mg/dL (ref 5–40)

## 2019-03-08 LAB — KAPPA/LAMBDA LIGHT CHAINS
Kappa free light chain: 98 mg/L — ABNORMAL HIGH (ref 3.3–19.4)
Kappa, lambda light chain ratio: 1.93 — ABNORMAL HIGH (ref 0.26–1.65)
Lambda free light chains: 50.7 mg/L — ABNORMAL HIGH (ref 5.7–26.3)

## 2019-03-09 ENCOUNTER — Telehealth: Payer: Self-pay | Admitting: Family Medicine

## 2019-03-09 DIAGNOSIS — I2699 Other pulmonary embolism without acute cor pulmonale: Secondary | ICD-10-CM

## 2019-03-09 NOTE — Telephone Encounter (Signed)
Called patient regarding results.  I discussed them at length.  Elevated ALT and AST Previously negative HIV and hepatitis C.  I recommend repeat hepatic function panel and consideration of right upper quadrant ultrasound at follow-up.  If persistently elevated closely review medication list.  Unlikely related to Eliquis  Abnormal UPEP  Given patient's recurrent pulmonary emboli and abnormal kappa lambda ratio will refer to hematology for further evaluation.  The patient previous to Dr.Kate.   Creatinine is stable no need to further adjust medications.  All questions were answered.  Patient is scheduled for follow-up with Dr. Grandville Silos her primary care provider

## 2019-03-10 ENCOUNTER — Other Ambulatory Visit: Payer: Self-pay

## 2019-03-10 ENCOUNTER — Inpatient Hospital Stay (HOSPITAL_COMMUNITY): Admission: RE | Admit: 2019-03-10 | Payer: Medicare Other | Source: Ambulatory Visit

## 2019-03-10 ENCOUNTER — Ambulatory Visit (INDEPENDENT_AMBULATORY_CARE_PROVIDER_SITE_OTHER): Payer: Medicare Other | Admitting: Family Medicine

## 2019-03-10 ENCOUNTER — Ambulatory Visit (HOSPITAL_COMMUNITY)
Admission: RE | Admit: 2019-03-10 | Discharge: 2019-03-10 | Disposition: A | Discharge status: Home / Self Care | Payer: Medicare Other | Source: Ambulatory Visit | Attending: Family Medicine | Admitting: Family Medicine

## 2019-03-10 VITALS — BP 125/70 | HR 73

## 2019-03-10 DIAGNOSIS — I824Y1 Acute embolism and thrombosis of unspecified deep veins of right proximal lower extremity: Secondary | ICD-10-CM | POA: Insufficient documentation

## 2019-03-10 DIAGNOSIS — M79604 Pain in right leg: Secondary | ICD-10-CM | POA: Diagnosis not present

## 2019-03-10 DIAGNOSIS — M79606 Pain in leg, unspecified: Secondary | ICD-10-CM | POA: Insufficient documentation

## 2019-03-10 NOTE — Progress Notes (Signed)
RLE venous duplex       has been completed. Preliminary results can be found under CV proc through chart review. June Leap, BS, RDMS, RVT    No call report number given. Attempted call report to Dr. Erin Hearing office line. Waited on hold with no answer. Will let patient leave since negative.

## 2019-03-10 NOTE — Progress Notes (Signed)
   Subjective:    Patient ID: Erin Coffey, female    DOB: May 09, 1946, 73 y.o.   MRN: [id number]   CC:  HPI: Patient presenting today for concern of DVT.  States that she began to have leg pain 1 day ago.  It was very sharp behind her calf radiating to back of the knee.  States this feels exactly like when she had a blood clot in 2012.  Patient has a history of frequent clotting and a recent PE. Patient recently seen on 03/07/2019 by Dr. Owens Shark for hospital follow-up of pulmonary embolism.  At that time was on loading dose of apixaban with starter pack and was prescribed apixaban 5 mg twice daily by Dr. Owens Shark at that appointment.  CBC showing normal platelets of 203.  Last Doppler studies from 10/2016 showing no evidence of DVT in the right lower extremity or Baker's cyst.  Patient does have a history of pulmonary embolism in July 2020, therapeutic on warfarin.  Per hematology it was decided that she would transition to apixaban.  Patient was referred to hematology on 7/29 for abnormal UPEP.  Patient states she is able to ambulate normally.  Has been taking her blood thinners as prescribed twice a day.  Has not had a hematology appointment yet, is waiting for them to call her back.  Does have an appointment with her PCP set up for 8/13.  Denies any fevers or rashes.  States that pain does radiate somewhat to her thigh.  States that while she was admitted in the hospital she was told by a doctor that she would need a Doppler ultrasound of her lower extremities before discharge and this was never done.  Denies any pain or swelling in her left lower extremity.  Denies any trauma.    Objective:  BP 125/70   Pulse 73   SpO2 99%  Vitals and nursing note reviewed  General: well nourished, in no acute distress HEENT: normocephalic Extremities: no edema or cyanosis. Warm, well perfused.  Tenderness to palpation of back of right calf leading to back popliteal fossa.  Negative Homans sign. Skin: warm and  dry, no rashes noted Neuro: alert and oriented, no focal deficits   Assessment & Plan:    Leg pain Patient with right lower extremity pain which is consistent with her previous DVT.  Patient is currently on apixaban at recommended dose and taking it appropriately, low likelihood for DVT if patient is taking this appropriately.  However given concern that she was recently transitioned off of warfarin to a new blood thinner patient may be intolerant to this so we will obtain right lower extremity Doppler ultrasound to ensure she has not formed a clot while on apixaban.  Can consider MSK origin if Dopplers negative.  If negative then can use Voltaren gel until follow-up with PCP in 2 weeks.  Strict return precautions given.  Advised patient that if ultrasound results are positive she will likely need to be admitted to the hospital.    Return in about 2 weeks (around 03/24/2019).   Caroline More, DO, PGY-3

## 2019-03-10 NOTE — Patient Instructions (Signed)
It was a pleasure seeing you today.   Today we discussed your leg pain  For your leg pain: Since it feels exactly like when you had a previous DVT I will obtain an ultrasound of your leg.  The ultrasound is scheduled for 2PM at [facility].   We will call you with the results, if they are positive you will need to go to the hospital for admission  Please follow up in 1 to 2 weeks or sooner if symptoms persist or worsen. Please call the clinic immediately if you have any concerns.   Our clinic's number is [phone]. Please call with questions or concerns.   Please go to the emergency room if have worsening pain, shortness of breath, chest pain.   Thank you,  Caroline More, DO

## 2019-03-10 NOTE — Assessment & Plan Note (Signed)
Patient with right lower extremity pain which is consistent with her previous DVT.  Patient is currently on apixaban at recommended dose and taking it appropriately, low likelihood for DVT if patient is taking this appropriately.  However given concern that she was recently transitioned off of warfarin to a new blood thinner patient may be intolerant to this so we will obtain right lower extremity Doppler ultrasound to ensure she has not formed a clot while on apixaban.  Can consider MSK origin if Dopplers negative.  If negative then can use Voltaren gel until follow-up with PCP in 2 weeks.  Strict return precautions given.  Advised patient that if ultrasound results are positive she will likely need to be admitted to the hospital.

## 2019-03-11 ENCOUNTER — Encounter: Payer: Self-pay | Admitting: Family Medicine

## 2019-03-11 NOTE — Progress Notes (Signed)
Please call patient and inform her that her lower extremity ultrasound was negative for DVT.

## 2019-03-17 ENCOUNTER — Telehealth: Payer: Self-pay | Admitting: Hematology

## 2019-03-17 ENCOUNTER — Ambulatory Visit: Payer: Medicare Other

## 2019-03-17 DIAGNOSIS — E1122 Type 2 diabetes mellitus with diabetic chronic kidney disease: Secondary | ICD-10-CM | POA: Diagnosis not present

## 2019-03-17 DIAGNOSIS — R809 Proteinuria, unspecified: Secondary | ICD-10-CM | POA: Diagnosis not present

## 2019-03-17 DIAGNOSIS — N183 Chronic kidney disease, stage 3 (moderate): Secondary | ICD-10-CM | POA: Diagnosis not present

## 2019-03-17 DIAGNOSIS — I129 Hypertensive chronic kidney disease with stage 1 through stage 4 chronic kidney disease, or unspecified chronic kidney disease: Secondary | ICD-10-CM | POA: Diagnosis not present

## 2019-03-17 DIAGNOSIS — D649 Anemia, unspecified: Secondary | ICD-10-CM | POA: Diagnosis not present

## 2019-03-17 NOTE — Telephone Encounter (Signed)
Received a new hem referral from Dr. Dorris Singh for PE. Erin Coffey has been cld and scheduled to see Dr. Irene Limbo on 8/27 at 10am. She's been made aware to arrive 20 minutes early.

## 2019-03-18 ENCOUNTER — Other Ambulatory Visit: Payer: Self-pay

## 2019-03-18 ENCOUNTER — Emergency Department (HOSPITAL_COMMUNITY): Payer: Medicare Other

## 2019-03-18 ENCOUNTER — Telehealth (INDEPENDENT_AMBULATORY_CARE_PROVIDER_SITE_OTHER): Payer: Medicare Other | Admitting: Family Medicine

## 2019-03-18 ENCOUNTER — Encounter (HOSPITAL_COMMUNITY): Payer: Self-pay | Admitting: Emergency Medicine

## 2019-03-18 ENCOUNTER — Emergency Department (HOSPITAL_COMMUNITY)
Admission: EM | Admit: 2019-03-18 | Discharge: 2019-03-19 | Disposition: A | Discharge status: Home / Self Care | Payer: Medicare Other | Attending: Emergency Medicine | Admitting: Emergency Medicine

## 2019-03-18 DIAGNOSIS — M50221 Other cervical disc displacement at C4-C5 level: Secondary | ICD-10-CM | POA: Diagnosis not present

## 2019-03-18 DIAGNOSIS — Z7901 Long term (current) use of anticoagulants: Secondary | ICD-10-CM | POA: Insufficient documentation

## 2019-03-18 DIAGNOSIS — E1122 Type 2 diabetes mellitus with diabetic chronic kidney disease: Secondary | ICD-10-CM | POA: Insufficient documentation

## 2019-03-18 DIAGNOSIS — R0602 Shortness of breath: Secondary | ICD-10-CM | POA: Diagnosis not present

## 2019-03-18 DIAGNOSIS — N189 Chronic kidney disease, unspecified: Secondary | ICD-10-CM | POA: Insufficient documentation

## 2019-03-18 DIAGNOSIS — R0789 Other chest pain: Secondary | ICD-10-CM | POA: Insufficient documentation

## 2019-03-18 DIAGNOSIS — I129 Hypertensive chronic kidney disease with stage 1 through stage 4 chronic kidney disease, or unspecified chronic kidney disease: Secondary | ICD-10-CM | POA: Diagnosis not present

## 2019-03-18 DIAGNOSIS — Z20828 Contact with and (suspected) exposure to other viral communicable diseases: Secondary | ICD-10-CM | POA: Diagnosis not present

## 2019-03-18 DIAGNOSIS — M75122 Complete rotator cuff tear or rupture of left shoulder, not specified as traumatic: Secondary | ICD-10-CM | POA: Diagnosis not present

## 2019-03-18 DIAGNOSIS — R079 Chest pain, unspecified: Secondary | ICD-10-CM

## 2019-03-18 DIAGNOSIS — M50223 Other cervical disc displacement at C6-C7 level: Secondary | ICD-10-CM | POA: Diagnosis not present

## 2019-03-18 DIAGNOSIS — Z79899 Other long term (current) drug therapy: Secondary | ICD-10-CM | POA: Diagnosis not present

## 2019-03-18 DIAGNOSIS — M5030 Other cervical disc degeneration, unspecified cervical region: Secondary | ICD-10-CM | POA: Diagnosis not present

## 2019-03-18 DIAGNOSIS — M19012 Primary osteoarthritis, left shoulder: Secondary | ICD-10-CM | POA: Diagnosis not present

## 2019-03-18 DIAGNOSIS — I2699 Other pulmonary embolism without acute cor pulmonale: Secondary | ICD-10-CM | POA: Diagnosis not present

## 2019-03-18 DIAGNOSIS — M5104 Intervertebral disc disorders with myelopathy, thoracic region: Secondary | ICD-10-CM | POA: Diagnosis not present

## 2019-03-18 LAB — CBC WITH DIFFERENTIAL/PLATELET
Abs Immature Granulocytes: 0.02 10*3/uL (ref 0.00–0.07)
Basophils Absolute: 0 10*3/uL (ref 0.0–0.1)
Basophils Relative: 0 %
Eosinophils Absolute: 0.2 10*3/uL (ref 0.0–0.5)
Eosinophils Relative: 4 %
HCT: 35 % — ABNORMAL LOW (ref 36.0–46.0)
Hemoglobin: 11 g/dL — ABNORMAL LOW (ref 12.0–15.0)
Immature Granulocytes: 0 %
Lymphocytes Relative: 37 %
Lymphs Abs: 1.7 10*3/uL (ref 0.7–4.0)
MCH: 28.6 pg (ref 26.0–34.0)
MCHC: 31.4 g/dL (ref 30.0–36.0)
MCV: 91.1 fL (ref 80.0–100.0)
Monocytes Absolute: 0.6 10*3/uL (ref 0.1–1.0)
Monocytes Relative: 13 %
Neutro Abs: 2.1 10*3/uL (ref 1.7–7.7)
Neutrophils Relative %: 46 %
Platelets: 216 10*3/uL (ref 150–400)
RBC: 3.84 MIL/uL — ABNORMAL LOW (ref 3.87–5.11)
RDW: 15.8 % — ABNORMAL HIGH (ref 11.5–15.5)
WBC: 4.8 10*3/uL (ref 4.0–10.5)
nRBC: 0 % (ref 0.0–0.2)

## 2019-03-18 LAB — BASIC METABOLIC PANEL
Anion gap: 11 (ref 5–15)
BUN: 15 mg/dL (ref 8–23)
CO2: 23 mmol/L (ref 22–32)
Calcium: 9.6 mg/dL (ref 8.9–10.3)
Chloride: 105 mmol/L (ref 98–111)
Creatinine, Ser: 1.27 mg/dL — ABNORMAL HIGH (ref 0.44–1.00)
GFR calc Af Amer: 49 mL/min — ABNORMAL LOW (ref >60)
GFR calc non Af Amer: 42 mL/min — ABNORMAL LOW (ref >60)
Glucose, Bld: 85 mg/dL (ref 70–99)
Potassium: 4.6 mmol/L (ref 3.5–5.1)
Sodium: 139 mmol/L (ref 135–145)

## 2019-03-18 LAB — TROPONIN I (HIGH SENSITIVITY)
Troponin I (High Sensitivity): 6 ng/L (ref <18)
Troponin I (High Sensitivity): 6 ng/L (ref <18)

## 2019-03-18 LAB — SARS CORONAVIRUS 2 BY RT PCR (HOSPITAL ORDER, PERFORMED IN ~~LOC~~ HOSPITAL LAB): SARS Coronavirus 2: NEGATIVE

## 2019-03-18 MED ORDER — DICLOFENAC SODIUM 3 % TD GEL: 1.0000 "application " | Freq: Two times a day (BID) | TRANSDERMAL | 0 refills | Status: DC | PRN

## 2019-03-18 MED ORDER — IOHEXOL 350 MG/ML SOLN
100.0000 mL | Freq: Once | INTRAVENOUS | Status: AC | PRN
  Administered 2019-03-18: 100 mL via INTRAVENOUS

## 2019-03-18 NOTE — ED Provider Notes (Signed)
Patient is a 73 year old female with a past medical history of type 2 diabetes mellitus, hypertension, hyperlipidemia, recent diagnosis of pulmonary embolism on anticoagulation presenting for left-sided chest pain.  She is pending evaluation by family practice, her primary care team, to determine admission.  1946 Received call from Dr. Maudie Mercury of family practice who recommends that patient receive MRI shoulder is a feel that patient is having reproducible musculoskeletal pain.  They will complete the discharge of the patient as appropriate but do not feel that patient's symptoms are cardiovascular at this time. Appreciate their involvement.    Albesa Seen, PA-C 03/18/19 2046    Malvin Johns, MD 03/31/19 [phone]

## 2019-03-18 NOTE — ED Provider Notes (Signed)
[facility] EMERGENCY DEPARTMENT Provider Note   CSN: [id number] Arrival date & time: 03/18/19  1046    History   Chief Complaint Chief Complaint  Patient presents with  . Shortness of Breath    HPI Erin Coffey is a 73 y.o. female.     Patient is a 73 year old female who presents with chest pain.  She had a recently diagnosed PE while on Coumadin and was switched to Eliquis.  That was on her right side.  She states that last night she started having some pain in her left chest.  She describes as a pressure type feeling.  She says it radiates a little bit to her back.  She has some associated exertional shortness of breath.  The chest pain does not change with exertion but she gets more short of breath on exertion.  She denies any increased leg swelling.  No fevers.  No cough or cold symptoms.  She had a telehealth visit this morning by her PCP who sent her here for further evaluation.  She states the pain feels similar to when she had the PE on the right side.     Past Medical History:  Diagnosis Date  . Allergy   . Anemia, iron deficiency   . ANXIETY, SITUATIONAL 04/06/2009   Qualifier: Diagnosis of  By: [name]  MD, Colletta [place]    . Arthritis   . BACK PAIN W/RADIATION, UNSPECIFIED 10/08/2006   Qualifier: Diagnosis of  By: [name]    . Back pain, thoracic 12/20/2012  . Carpal tunnel syndrome 12/07/2014  . Cholelithiasis 06/25/2012  . Chronic kidney disease   . Diabetes mellitus   . DM type 2 goal A1C below 7.5 06/05/2008   Qualifier: Diagnosis of  By: [name]  MD, Colletta [place]    . GASTROESOPHAGEAL REFLUX, NO ESOPHAGITIS 10/08/2006   Qualifier: Diagnosis of  By: [name]    . GERD (gastroesophageal reflux disease)   . Goiter, unspecified 10/21/2007   Qualifier: Diagnosis of  By: [name] MD, [name]    . History of DVT (deep vein thrombosis) 05/05/2013  . Hx of pulmonary embolus 05/08/2011  . HYPERLIPIDEMIA 08/08/2008   Qualifier: Diagnosis of  By: [name]  MD,  Colletta [place]    . Hypertension   . HYPERTENSION, BENIGN ESSENTIAL 11/27/2006   Qualifier: Diagnosis of  By: [name] MD, [place]    . Left tennis elbow 11/02/2014  . Long term (current) use of anticoagulants 05/08/2011  . Palpitations 10/01/2012  . Personal history of colonic polyps 10/24/2009   Qualifier: Diagnosis of  By: [name] MD, Sandy Salaam   . Pica 08/08/2009   Qualifier: Diagnosis of  By: [name], [name]    . Polymyalgia rheumatica ([place]) 10/02/2009   Qualifier: Diagnosis of  By: [name] MD, Apolonio Schneiders    . Pulmonary embolism ([place]) 04/28/11  . RENAL FAILURE 10/18/2009   Qualifier: History of  By: [name]  MD, Colletta [place]    . Thyroid disease   . Trigger ring finger of left hand 11/02/2014  . UNSPECIFIED IRON DEFICIENCY ANEMIA 10/02/2009   Qualifier: Diagnosis of  By: [name] MD, Apolonio Schneiders      Patient Active Problem List   Diagnosis Date Noted  . Leg pain 03/10/2019  . Primary hypercoagulable state ([place]) 03/07/2019  . Osteoporotic compression fracture of spine ([place]) 03/07/2019  . Elevated serum protein level 03/07/2019  . Pulmonary emboli ([place]) 03/03/2019  . Chronic left hip pain 08/17/2018  . Leg swelling 07/22/2018  . Left sided numbness 07/22/2018  .  Onychogryphosis 05/31/2018  . Tinea pedis of right foot 05/31/2018  . Low back pain potentially associated with radiculopathy 01/29/2018  . Diverticulosis of colon 03/24/2017  . Hypertensive retinopathy of right eye 01/20/2017  . Hypermetropia of both eyes 01/20/2017  . Back pain 01/05/2017  . Osteopenia 05/28/2016  . Chronic kidney disease 04/29/2016  . Normocytic anemia 04/29/2016  . Dark stools 10/19/2015  . Rotator cuff dysfunction 06/25/2015  . Long term (current) use of anticoagulants 05/08/2011  . HYPERLIPIDEMIA 08/08/2008  . Type 2 diabetes mellitus with hemoglobin A1c goal of less than 7.5% ([place]) 06/05/2008  . HYPERTENSION, BENIGN ESSENTIAL 11/27/2006  . GASTROESOPHAGEAL REFLUX, NO ESOPHAGITIS 10/08/2006  . Osteoarthritis, multiple  sites 10/08/2006    Past Surgical History:  Procedure Laterality Date  . ABDOMINAL HYSTERECTOMY    . CARDIAC CATHETERIZATION  04/29/11   Clean cardiac cath, no blockages.   . CARPAL TUNNEL RELEASE Right 1996  . KNEE ARTHROSCOPY  Bilateral  . TUBAL LIGATION       OB History   No obstetric history on file.      Home Medications    Prior to Admission medications   Medication Sig Start Date End Date Taking? Authorizing Provider  amLODipine (NORVASC) 5 MG tablet Take 1 tablet (5 mg total) by mouth at bedtime. 03/07/19   Martyn Malay, MD  apixaban (ELIQUIS) 5 MG TABS tablet Take 1 tablet (5 mg total) by mouth 2 (two) times daily. Begin after completing starterpack for eliquis 03/24/19   Martyn Malay, MD  atorvastatin (LIPITOR) 40 MG tablet Take 1 tablet (40 mg total) by mouth daily. Patient taking differently: Take 40 mg by mouth every Tuesday, Thursday, and Saturday at 6 PM.  10/20/18   Bonnita Hollow, MD  baclofen (LIORESAL) 10 MG tablet Take 1 tablet (10 mg total) by mouth daily as needed for muscle spasms. 03/07/19   Martyn Malay, MD  Blood Glucose Monitoring Suppl (ONETOUCH VERIO) w/Device KIT Use as instructed to test sugars once daily.  Dx Code: E11.9 02/10/19   Martyn Malay, MD  calcium-vitamin D (OSCAL-500) 500-400 MG-UNIT tablet Take 1 tablet by mouth 2 (two) times daily. 05/28/16   Smiley Houseman, MD  diclofenac sodium (VOLTAREN) 1 % GEL Apply 2 g topically 4 (four) times daily. Patient taking differently: Apply 2 g topically 4 (four) times daily. Back 03/03/19   Bonnita Hollow, MD  Eliquis DVT/PE Starter Pack [facility] STARTER PACK) 5 MG TABS Take as directed on package: start with two-20m tablets twice daily for 7 days. On day 8, switch to one-580mtablet twice daily. 03/04/19   BlSherene SiresDO  famotidine (PEPCID) 20 MG tablet Take 1 tablet (20 mg total) by mouth 2 (two) times daily. 03/07/19   BrMartyn MalayMD  fish oil-omega-3 fatty acids 1000 MG capsule  Take 1,200 mg by mouth daily.     [provider]  gabapentin (NEURONTIN) 100 MG capsule Take 1 capsule (100 mg total) by mouth 3 (three) times daily. 03/07/19   BrMartyn MalayMD  glucose blood (ONETOUCH VERIO) test strip Use as instructed to test sugars once daily.  Dx Code: E11.9 02/07/19   ThJosephine Igo, MD  glucose blood test strip Use as instructed 12/10/15   GuSmiley HousemanMD  iron polysaccharides (FERREX 150) 150 MG capsule Take 1 capsule (150 mg total) by mouth daily. 03/03/19   ThBonnita HollowMD  ketoconazole (NIZORAL) 2 % cream APPLY TO AFFECTED  AREA TWICE A DAY Patient taking differently: Apply 1 application topically daily as needed ([place]).  03/03/19   Bonnita Hollow, MD  Lancet Devices (ONE TOUCH DELICA LANCING DEV) MISC Use as instructed to test sugars once daily.  Dx Code: E11.9 02/10/19   Martyn Malay, MD  losartan (COZAAR) 25 MG tablet Take 1 tablet (25 mg total) by mouth at bedtime. 03/07/19   Martyn Malay, MD  metoprolol tartrate (LOPRESSOR) 25 MG tablet Take 1 tablet by mouth twice daily Patient taking differently: Take 25 mg by mouth 2 (two) times daily.  01/21/19   Bonnita Hollow, MD  OneTouch Delica Lancets 28M MISC Use as instructed to test sugars once daily.  Dx Code: E11.9 02/10/19   Martyn Malay, MD  polyethylene glycol powder (GLYCOLAX/MIRALAX) powder Take 17 g by mouth 2 (two) times daily as needed. 08/17/18   Rory Percy, DO  triamcinolone cream (KENALOG) 0.1 % apply to affected area twice a day Patient taking differently: Apply 1 application topically as needed ([place]).  08/17/18   Rory Percy, DO    Family History Family History  Problem Relation Age of Onset  . Diabetes Mother   . Heart disease Mother   . Hypertension Mother   . Heart disease Father   . Hypertension Father   . Diabetes Sister   . Heart disease Sister   . Hypertension Brother   . Cancer Sister 67       lukemia  . Cirrhosis Sister        alcohol  .  Kidney disease Sister   . Diabetes Sister   . Diabetes Brother   . Colon cancer Neg Hx     Social History Social History   Tobacco Use  . Smoking status: Never Smoker  . Smokeless tobacco: Never Used  Substance Use Topics  . Alcohol use: No    Alcohol/week: 0.0 standard drinks  . Drug use: No     Allergies   Lisinopril   Review of Systems Review of Systems  Constitutional: Negative for chills, diaphoresis, fatigue and fever.  HENT: Negative for congestion, rhinorrhea and sneezing.   Eyes: Negative.   Respiratory: Positive for shortness of breath. Negative for cough and chest tightness.   Cardiovascular: Positive for chest pain. Negative for leg swelling.  Gastrointestinal: Negative for abdominal pain, blood in stool, diarrhea, nausea and vomiting.  Genitourinary: Negative for difficulty urinating, flank pain, frequency and hematuria.  Musculoskeletal: Negative for arthralgias and back pain.  Skin: Negative for rash.  Neurological: Negative for dizziness, speech difficulty, weakness, numbness and headaches.     Physical Exam Updated Vital Signs BP (!) 153/63   Pulse (!) 51   Temp 98.9 F (37.2 C) (Oral)   Resp 13   Ht _0  (1.753 m)   Wt (!) 138.3 kg   SpO2 100%   BMI 45.04 kg/m   Physical Exam Constitutional:      Appearance: She is well-developed.  HENT:     Head: Normocephalic and atraumatic.  Eyes:     Pupils: Pupils are equal, round, and reactive to light.  Neck:     Musculoskeletal: Normal range of motion and neck supple.  Cardiovascular:     Rate and Rhythm: Normal rate and regular rhythm.     Heart sounds: Normal heart sounds.  Pulmonary:     Effort: Pulmonary effort is normal. No respiratory distress.     Breath sounds: Normal breath sounds. No wheezing or rales.  Chest:  Chest wall: No tenderness.  Abdominal:     General: Bowel sounds are normal.     Palpations: Abdomen is soft.     Tenderness: There is no abdominal tenderness. There  is no guarding or rebound.  Musculoskeletal: Normal range of motion.     Comments: 1+ pitting edema to the lower extremities bilaterally  Lymphadenopathy:     Cervical: No cervical adenopathy.  Skin:    General: Skin is warm and dry.     Findings: No rash.  Neurological:     Mental Status: She is alert and oriented to person, place, and time.      ED Treatments / Results  Labs (all labs ordered are listed, but only abnormal results are displayed) Labs Reviewed  BASIC METABOLIC PANEL - Abnormal; Notable for the following components:      Result Value   Creatinine, Ser 1.27 (*)    GFR calc non Af Amer 42 (*)    GFR calc Af Amer 49 (*)    All other components within normal limits  CBC WITH DIFFERENTIAL/PLATELET - Abnormal; Notable for the following components:   RBC 3.84 (*)    Hemoglobin 11.0 (*)    HCT 35.0 (*)    RDW 15.8 (*)    All other components within normal limits  SARS CORONAVIRUS 2 (HOSPITAL ORDER, [facility])  TROPONIN I (HIGH SENSITIVITY)  TROPONIN I (HIGH SENSITIVITY)    EKG EKG Interpretation  Date/Time:  Friday March 18 2019 11:55:19 EDT Ventricular Rate:  59 PR Interval:    QRS Duration: 96 QT Interval:  431 QTC Calculation: 427 R Axis:   46 Text Interpretation:  Sinus rhythm Low voltage, precordial leads since last tracing no significant change Confirmed by Malvin Johns [phone]) on 03/18/2019 11:58:27 AM   Radiology Ct Angio Chest Pe W/cm &/or Wo Cm  Result Date: 03/18/2019 CLINICAL DATA:  Shortness of breath. High pretest probability of pulmonary embolism clinically. EXAM: CT ANGIOGRAPHY CHEST WITH CONTRAST TECHNIQUE: Multidetector CT imaging of the chest was performed using the standard protocol during bolus administration of intravenous contrast. Multiplanar CT image reconstructions and MIPs were obtained to evaluate the vascular anatomy. CONTRAST:  155m OMNIPAQUE IOHEXOL 350 MG/ML SOLN COMPARISON:  Chest CTA 03/03/2019.   Radiographs 03/03/2019. FINDINGS: Cardiovascular: The pulmonary arteries are well opacified with contrast to the level of the subsegmental branches. There is no evidence of acute pulmonary embolism. There is mild atherosclerosis of the aorta, great vessels and coronary arteries without acute vascular findings. Stable mild cardiomegaly and a small pericardial effusion. Mediastinum/Nodes: There are no enlarged mediastinal, hilar or axillary lymph nodes.There are stable small axillary lymph nodes bilaterally and a small hiatal hernia. The trachea and thyroid gland appear unremarkable. Lungs/Pleura: There is no pleural effusion or pneumothorax. The lungs remain clear. Upper abdomen: Diffuse hepatic low density consistent with steatosis. No acute findings. Musculoskeletal/Chest wall: There is no chest wall mass or suspicious osseous finding. Review of the MIP images confirms the above findings. IMPRESSION: 1. No evidence for recurrent acute pulmonary embolism or other acute chest process. 2. Stable cardiomegaly and small pericardial effusion. 3. Mild Aortic Atherosclerosis (ICD10-I70.0). Electronically Signed   By: WRichardean SaleM.D.   On: 03/18/2019 17:29    Procedures Procedures (including critical care time)  Medications Ordered in ED Medications  iohexol (OMNIPAQUE) 350 MG/ML injection 100 mL (100 mLs Intravenous Contrast Given 03/18/19 1711)     Initial Impression / Assessment and Plan / ED Course  I have reviewed the triage vital signs and the nursing notes.  Pertinent labs & imaging results that were available during my care of the patient were reviewed by me and considered in my medical decision making (see chart for details).        Patient is a 73 year old female who presents with chest pain.  It is left-sided.  She has some exertional shortness of breath.  She has a heart score of 5.  She has had 2- troponins.  Her EKG does not show any ischemic changes.  Her CT scan shows no evidence of  PE.  Given her elevated heart score, I have consulted family medicine to see the patient for possible further evaluation for ACS.  Final Clinical Impressions(s) / ED Diagnoses   Final diagnoses:  Chest pain, unspecified type    ED Discharge Orders    None       Malvin Johns, MD 03/18/19 8412

## 2019-03-18 NOTE — ED Notes (Signed)
Pt ambulated to the bathroom independently.
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
## 2019-03-18 NOTE — ED Triage Notes (Signed)
Sent from MD concerns for a new PE.

## 2019-03-18 NOTE — ED Notes (Signed)
Dinner tray ordered.

## 2019-03-18 NOTE — Discharge Instructions (Addendum)
Dear Erin Coffey,   Thank you for letting us participate in your care! In this section, you will find a brief ED summary  You were seen in the emergency department due to chest pain that was concerning for repeat pulmonary embolism or cardiac issue.  Your tests were normal and imaging was also normal.  You were found to have some decrease in motion of your left arm and some reproducible pain in your left shoulder and left chest.  It is more likely that your chest pain is coming from your chronic issues in your left shoulder.  You will not have to be admitted to the hospital at this time.  We will discharge you after obtaining MRI studies.  I have scheduled you for an appointment on Monday, March 21, 2019 to be seen at [facility] family [facility] center.  At that time, we will discuss MRI results with you and further discuss plans for referral or treatment.  Should any imaging come back with abnormal findings that need to be immediately assessed, we will call you by phone.  POST-HOSPITAL & CARE INSTRUCTIONS 1. Go to your follow up appointment as below 2. For pain, please continue to take acetaminophen 650 mg every 6 hours as needed for pain. I have also sent voltaren topical gel to your pharmacy. If you are having further pain, please call the after hours line for any emergent needs.   DOCTOR'S APPOINTMENT & FOLLOW UP CARE INSTRUCTIONS  Future Appointments  Date Time Provider [place]  03/21/2019  1:50 PM ACCESS TO CARE POOL [facility]  03/24/2019  8:30 AM Bonnita Hollow, MD [facility]  04/07/2019 10:00 AM Brunetta Genera, MD [facility] None  06/03/2019  9:15 AM Marzetta Board, DPM [facility]     Thank you for choosing [facility]! Take care and be well!  [facility]  [place], [place] [address] [phone]

## 2019-03-18 NOTE — ED Notes (Signed)
Patient transported to MRI 

## 2019-03-18 NOTE — ED Triage Notes (Signed)
Pt coming from home. Pt complaining of shortness of breath and back pain. Pt states that she was sent by her MD for PE work up. Pt states she was treated for a PE a few weeks ago on the right side. MD thinks may have one on left side. Pt states she has gain 7 lbs in two weeks.

## 2019-03-18 NOTE — ED Notes (Signed)
Dark green, light green, blue, and purple top have been collected and sent to lab.

## 2019-03-18 NOTE — Progress Notes (Signed)
[facility] Telemedicine Visit  Patient consented to have virtual visit. Method of visit: Telephone  Encounter participants: Patient: Erin Coffey - located at home Provider: Aaron B Thompson - located at office Others (if applicable): n/a  Chief Complaint: chest pain  HPI:  Erin Coffey is a 73 y.o. female who presents with 1 day of recurrent chest pain.  Patient has a past medical history of pulmonary embolism.  She is also recently hospital with pulmonary embolism and has failed warfarin therapy.  Patient was hospitalized on 03/03/2019 for recurrent pulmonary embolism.  Upon discharge, patient was discontinued on warfarin and started on Eliquis.  Hypercoagulable and cancer work-up were initiated.  She was found to have elevated kappa and lambda light chains raising concern for multiple myeloma.  Patient has follow-up with heme-onc scheduled on 04/07/2019.  Patient says that the chest pain that she initially had from her last hospitalization had resolved.  The chest pain she is feeling is a new chest pain.  It is associated with exertional dyspnea.  The chest pain is on the left side.   ROS: per HPI  Pertinent PMHx: Pulmonary embolism, chronic anticoagulation  Exam:  Respiratory: Able to speak in full sentences without issue  Assessment/Plan: Recurrent chest pain Left-sided, associated with dyspnea on exertion.  Given patient's recent failure and warfarin and newfound pulmonary embolism and concern for hypercoagulability/possible multiple myeloma, patient is at high risk of having recurrent pulmonary embolism.  Recommend patient go to the emergency department for evaluation.  Patient knowledge this.  Plan to go.  Precepted with Dr. Chambliss.  Emergency department was notified of patient coming to be seen.  Time spent during visit with patient: 6 minutes  

## 2019-03-18 NOTE — ED Notes (Signed)
Patient transported to CT 
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
## 2019-03-21 ENCOUNTER — Other Ambulatory Visit: Payer: Self-pay

## 2019-03-21 ENCOUNTER — Telehealth: Payer: Self-pay | Admitting: Family Medicine

## 2019-03-21 ENCOUNTER — Ambulatory Visit (INDEPENDENT_AMBULATORY_CARE_PROVIDER_SITE_OTHER): Payer: Medicare Other | Admitting: Family Medicine

## 2019-03-21 DIAGNOSIS — R0782 Intercostal pain: Secondary | ICD-10-CM | POA: Diagnosis not present

## 2019-03-21 DIAGNOSIS — M75122 Complete rotator cuff tear or rupture of left shoulder, not specified as traumatic: Secondary | ICD-10-CM

## 2019-03-21 MED ORDER — DICLOFENAC SODIUM 3 % TD GEL: 1.0000 "application " | Freq: Two times a day (BID) | TRANSDERMAL | 0 refills | Status: AC | PRN

## 2019-03-21 NOTE — Telephone Encounter (Signed)
Patiently recently seen in the ED for chest pain.  Had a left MRI of the shoulder which showed full rotator cuff thickness tear.  Discussed results with patient.  Will refer patient to orthopedics for further surgical evaluation and management.  Patient already has established relationship with [facility].  Recommended that patient go ahead and call and make an appointment to see them for this issue.

## 2019-03-21 NOTE — Progress Notes (Signed)
Subjective: HPI: Erin Coffey is a 73 y.o. presenting to clinic today to discuss the following:  ED Visit Follow Up Patient presents today for follow up after an ED visit for chest pain. She has a PMH of HLD, HTN, and DM. She describes her chest pain as non-radiating left sided pressure that is "pulling". She went to the ED Friday had a work up and was discharged from the ED after negative troponins x 2, normal EKG, and resolution of symptoms. She states the chest pain is non-exertional and seems random without associated nausea or diaphoresis. She has these episodes 2-3 times per day every day for the past couple of weeks. She has never had anything like this before. She was treated recently for a PE and was started on Eliquis. She has no pleuritic chest pain and had a CTA chest in the ED which was negative for PE. No fever, chills, abdominal pain, back pain, diarrhea, or constipation.  ROS noted in HPI.   Past Medical, Surgical, Social, and Family History Reviewed & Updated per EMR.   Pertinent Historical Findings include:   Social History   Tobacco Use  Smoking Status Never Smoker  Smokeless Tobacco Never Used   Objective: BP 125/70   Pulse 76   Wt 298 lb 6.4 oz (135.4 kg)   SpO2 99%   BMI 44.07 kg/m  Vitals and nursing notes reviewed  Physical Exam Gen: Alert and Oriented x 3, NAD HEENT: Normocephalic, atraumatic CV: RRR, no murmurs, normal S1, S2 split; chest is tender to palpation along the sternocostal border Resp: CTAB, no wheezing, rales, or rhonchi, comfortable work of breathing Abd: non-distended, non-tender, soft, +bs in all four quadrants Ext: no clubbing, cyanosis, or edema Skin: warm, dry, intact, no rashes  Results for orders placed or performed during the hospital encounter of 03/18/19 (from the past 72 hour(s))  Troponin I (High Sensitivity)     Status: None   Collection Time: 03/18/19  2:00 PM  Result Value Ref Range   Troponin I (High  Sensitivity) 6 <18 ng/L    Comment: (NOTE) Elevated high sensitivity troponin I (hsTnI) values and significant  changes across serial measurements may suggest ACS but many other  chronic and acute conditions are known to elevate hsTnI results.  Refer to the "Links" section for chest pain algorithms and additional  guidance. Performed at [facility], [place] [address] [place], [place] [address]     Assessment/Plan:  Chest pain Patient with heart score of 5 but atypical chest pain with negative work up from the ED and a negative CTA for PE. Unlikely to be cardiac in nature however given her risk factors and history of PE I think it wise to watch her closely. Given her negative work up, tenderness to palpation on exam, and no active chest pain during her appointment today I will treat for costochondritis to see if she has any improvement in her symptoms. - Diclofenac gel prn TID - Discussed reasons to return to clinic and seek emergency care   PATIENT EDUCATION PROVIDED: See AVS    Diagnosis and plan along with any newly prescribed medication(s) were discussed in detail with this patient today. The patient verbalized understanding and agreed with the plan. Patient advised if symptoms worsen return to clinic or ER.   No orders of the defined types were placed in this encounter.   Meds ordered this encounter  Medications  . Diclofenac Sodium 3 % GEL  Sig: Place 1 application onto the skin 2 (two) times daily as needed for up to 5 days.    Dispense:  100 g    Refill:  0    Harolyn Rutherford, DO 03/21/2019, 1:55 PM PGY-3 [place]

## 2019-03-21 NOTE — Patient Instructions (Signed)
It was great to meet you today! Thank you for letting me participate in your care!  Today, we discussed your recent chest pain which appears to be musculoskeletal in nature. Please use the Diclofenac gel on your chest as needed. If your pain continues and the gel does not help please call us and let us know.  Be well, Harolyn Rutherford, DO PGY-3, [facility]

## 2019-03-22 ENCOUNTER — Telehealth: Payer: Self-pay | Admitting: *Deleted

## 2019-03-22 NOTE — Telephone Encounter (Signed)
Received fax from pharmacy, PA needed on diclofenac gel.  Clinical questions submitted via Cover My Meds.  Waiting on response, could take up to 72 hours.  Cover My Meds info: Key: [id number]  Christen Bame, CMA

## 2019-03-24 ENCOUNTER — Ambulatory Visit (INDEPENDENT_AMBULATORY_CARE_PROVIDER_SITE_OTHER): Payer: Medicare Other | Admitting: Family Medicine

## 2019-03-24 ENCOUNTER — Encounter: Payer: Self-pay | Admitting: Family Medicine

## 2019-03-24 ENCOUNTER — Other Ambulatory Visit: Payer: Self-pay

## 2019-03-24 VITALS — BP 138/74 | HR 64

## 2019-03-24 DIAGNOSIS — R740 Nonspecific elevation of levels of transaminase and lactic acid dehydrogenase [LDH]: Secondary | ICD-10-CM

## 2019-03-24 DIAGNOSIS — M8000XA Age-related osteoporosis with current pathological fracture, unspecified site, initial encounter for fracture: Secondary | ICD-10-CM | POA: Diagnosis not present

## 2019-03-24 DIAGNOSIS — Z8781 Personal history of (healed) traumatic fracture: Secondary | ICD-10-CM | POA: Diagnosis not present

## 2019-03-24 DIAGNOSIS — R7401 Elevation of levels of liver transaminase levels: Secondary | ICD-10-CM

## 2019-03-24 DIAGNOSIS — Z7901 Long term (current) use of anticoagulants: Secondary | ICD-10-CM | POA: Diagnosis not present

## 2019-03-24 DIAGNOSIS — I208 Other forms of angina pectoris: Secondary | ICD-10-CM | POA: Insufficient documentation

## 2019-03-24 DIAGNOSIS — M8088XS Other osteoporosis with current pathological fracture, vertebra(e), sequela: Secondary | ICD-10-CM | POA: Diagnosis not present

## 2019-03-24 DIAGNOSIS — M75122 Complete rotator cuff tear or rupture of left shoulder, not specified as traumatic: Secondary | ICD-10-CM

## 2019-03-24 DIAGNOSIS — R079 Chest pain, unspecified: Secondary | ICD-10-CM | POA: Insufficient documentation

## 2019-03-24 MED ORDER — ALENDRONATE SODIUM 70 MG PO TABS: 70.0000 mg | ORAL_TABLET | ORAL | 11 refills | Status: DC

## 2019-03-24 NOTE — Assessment & Plan Note (Signed)
Patient with spinal fracture, likely fragility fracture in setting of osteoporosis.  Artie on calcium and vitamin D supplements.  Will add weekly alendronate.  Will repeat DEXA scan.

## 2019-03-24 NOTE — Patient Instructions (Signed)
It was a pleasure to see you today! Thank you for choosing [facility] for your primary care. Erin Coffey was seen for checkup.   1.  For your shoulder pain continue to use diclofenac gel they are over-the-counter.  We have placed a referral for you to see orthopedic surgery with [facility] and when you are able to.  2.  Continue take your blood thinner twice a day as you have been.  3.  For your history of spinal fracture and history of osteopenia we are going to start you on a medicine called alendronate.  Take this once a week with a full glass water on empty stomach.  Remain upright for 30 minutes.  We are also going to send you for a bone DEXA scan.  4.  For your elevated liver numbers, we are going to recheck these.  If they are still elevated we likely get a liver ultrasound.  Come back to the clinic in 6 months or sooner as needed.  Best,  Marny Lowenstein, MD, MS FAMILY MEDICINE RESIDENT - PGY3 03/24/2019 8:56 AM

## 2019-03-24 NOTE — Assessment & Plan Note (Addendum)
Mild transaminitis found on CMP admission to hospital.  Will repeat LFTs.  If abnormal will get right upper quadrant ultrasound.  Prior right upper quadrant ultrasound showed cholelithiasis and 2018.  But patient is without abdominal pain so I do not believe that this is the cause.

## 2019-03-24 NOTE — Progress Notes (Signed)
    Subjective:  Erin Coffey is a 73 y.o. female who [facility] to [facility] today with a hospital and ED follow-up.   HPI:  Left shoulder pain with acute rotator cuff tear with full-thickness Patient found to have full-thickness rotator cuff tear left shoulder on MRI on 03/18/2019.  Patient says the pain is improved with diclofenac gel.  Referrals are to been placed for patient to follow-up with orthopedics.  Supraventricular Murphy Wainer.  She has not had the appointment yet.  History of elevated transaminases Found on most recent CMP.  Without abdominal pain, nausea, vomiting.  Patient had cholelithiasis noted on right upper quadrant ultrasound on 03/2017  History of superior endplate fracture of L1, status post healed. Also with elevated SPEP concern for possible multiple myeloma.  Patient has follow-up heme-onc for this.  Also could likely age related fragility fracture.  Patient had DEXA scan in 2017 that showed osteopenia.  Patient only on calcium and vitamin D supplements.  Recent pulmonary embolism, lifelong anticoagulation Possibly provoked due to underlying malignancy most likely multiple myeloma if home.  Patient seen on follow-up with us.  Patient started on Eliquis 5 mg twice daily.  Patient is taking this daily.  Denies chest pain.  Denies shortness of breath.  ROS: Per HPI  PMH: Smoking history reviewed.    Objective:  Physical Exam: BP 138/74   Pulse 64   SpO2 99%   Gen: NAD, resting comfortably CV: RRR with no murmurs appreciated Pulm: NWOB, CTAB with no crackles, wheezes, or rhonchi GI: Soft, Nontender, Nondistended. MSK: no edema, cyanosis, or clubbing noted Skin: warm, dry Neuro: grossly normal, moves all extremities Psych: Normal affect and thought content  No results found for this or any previous visit (from the past 72 hour(s)).   Assessment/Plan:  Osteoporotic compression fracture of spine (HCC) Patient with spinal fracture, likely fragility  fracture in setting of osteoporosis.  Artie on calcium and vitamin D supplements.  Will add weekly alendronate.  Will repeat DEXA scan.  Long term (current) use of anticoagulants Had a PE x 1 in 04/2011 and was started on Coumadin.  Patient failed Coumadin and repeat pulmonary embolism given no therapeutic on INR on 02/2019.  Patient started on Eliquis.  Patient has follow-up with heme-onc.  Elevated transaminase level Mild transaminitis found on CMP admission to hospital.  Will repeat LFTs.  If abnormal will get right upper quadrant ultrasound.  Prior right upper quadrant ultrasound showed cholelithiasis and 2018.  But patient is without abdominal pain so I do not believe that this is the cause.  Nontraumatic complete tear of left rotator cuff Found incidentally on MRI admission hospital for possible chest pain.  Patient referred to orthopedics for surgical evaluation.  Taking diclofenac gel.  For pain.    Lab Orders     Hepatic Function Panel  Meds ordered this encounter  Medications  . alendronate (FOSAMAX) 70 MG tablet    Sig: Take 1 tablet (70 mg total) by mouth every 7 (seven) days. Take with a full glass of water on an empty stomach.    Dispense:  4 tablet    Refill:  11      Brad Thompson, MD, MS FAMILY MEDICINE RESIDENT - PGY3 03/24/2019 10:58 AM 

## 2019-03-24 NOTE — Assessment & Plan Note (Signed)
Found incidentally on MRI admission hospital for possible chest pain.  Patient referred to orthopedics for surgical evaluation.  Taking diclofenac gel.  For pain.

## 2019-03-24 NOTE — Assessment & Plan Note (Signed)
Patient with heart score of 5 but atypical chest pain with negative work up from the ED and a negative CTA for PE. Unlikely to be cardiac in nature however given her risk factors and history of PE I think it wise to watch her closely. Given her negative work up, tenderness to palpation on exam, and no active chest pain during her appointment today I will treat for costochondritis to see if she has any improvement in her symptoms. - Diclofenac gel prn TID - Discussed reasons to return to clinic and seek emergency care

## 2019-03-24 NOTE — Assessment & Plan Note (Signed)
Had a PE x 1 in 04/2011 and was started on Coumadin.  Patient failed Coumadin and repeat pulmonary embolism given no therapeutic on INR on 02/2019.  Patient started on Eliquis.  Patient has follow-up with heme-onc.

## 2019-03-25 LAB — HEPATIC FUNCTION PANEL
ALT: 17 [IU]/L (ref 0–32)
AST: 26 [IU]/L (ref 0–40)
Albumin: 4 g/dL (ref 3.7–4.7)
Alkaline Phosphatase: 70 [IU]/L (ref 39–117)
Bilirubin Total: 0.5 mg/dL (ref 0.0–1.2)
Bilirubin, Direct: 0.16 mg/dL (ref 0.00–0.40)
Total Protein: 8.1 g/dL (ref 6.0–8.5)

## 2019-03-27 ENCOUNTER — Encounter: Payer: Self-pay | Admitting: Family Medicine

## 2019-04-04 ENCOUNTER — Telehealth: Payer: Self-pay | Admitting: *Deleted

## 2019-04-04 MED ORDER — AMLODIPINE BESYLATE 5 MG PO TABS: 5.0000 mg | ORAL_TABLET | Freq: Every day | ORAL | 3 refills | Status: DC

## 2019-04-04 NOTE — Telephone Encounter (Signed)
Pt was told to decrease amlodipine from 10mg  to 5mg .  Right now she is splitting the 10mg  pill in half but she was prescribed a 10mg  pill again after hospital stay.  Would like to have a new script for 5mg  so that she does not have to split them anymore. Christen Bame, CMA

## 2019-04-07 ENCOUNTER — Other Ambulatory Visit: Payer: Self-pay

## 2019-04-07 ENCOUNTER — Telehealth: Payer: Self-pay | Admitting: Hematology

## 2019-04-07 ENCOUNTER — Inpatient Hospital Stay: Payer: Medicare Other | Attending: Hematology | Admitting: Hematology

## 2019-04-07 VITALS — BP 155/63 | HR 70 | Temp 98.7°F | Resp 18 | Ht 69.0 in | Wt 300.1 lb

## 2019-04-07 DIAGNOSIS — R0602 Shortness of breath: Secondary | ICD-10-CM | POA: Insufficient documentation

## 2019-04-07 DIAGNOSIS — Z8379 Family history of other diseases of the digestive system: Secondary | ICD-10-CM | POA: Diagnosis not present

## 2019-04-07 DIAGNOSIS — I700 Atherosclerosis of aorta: Secondary | ICD-10-CM | POA: Diagnosis not present

## 2019-04-07 DIAGNOSIS — K449 Diaphragmatic hernia without obstruction or gangrene: Secondary | ICD-10-CM | POA: Insufficient documentation

## 2019-04-07 DIAGNOSIS — Z86718 Personal history of other venous thrombosis and embolism: Secondary | ICD-10-CM | POA: Diagnosis not present

## 2019-04-07 DIAGNOSIS — I2699 Other pulmonary embolism without acute cor pulmonale: Secondary | ICD-10-CM | POA: Diagnosis not present

## 2019-04-07 DIAGNOSIS — Z806 Family history of leukemia: Secondary | ICD-10-CM | POA: Insufficient documentation

## 2019-04-07 DIAGNOSIS — M6250 Muscle wasting and atrophy, not elsewhere classified, unspecified site: Secondary | ICD-10-CM | POA: Diagnosis not present

## 2019-04-07 DIAGNOSIS — I1310 Hypertensive heart and chronic kidney disease without heart failure, with stage 1 through stage 4 chronic kidney disease, or unspecified chronic kidney disease: Secondary | ICD-10-CM | POA: Diagnosis not present

## 2019-04-07 DIAGNOSIS — K219 Gastro-esophageal reflux disease without esophagitis: Secondary | ICD-10-CM | POA: Diagnosis not present

## 2019-04-07 DIAGNOSIS — Z8249 Family history of ischemic heart disease and other diseases of the circulatory system: Secondary | ICD-10-CM | POA: Diagnosis not present

## 2019-04-07 DIAGNOSIS — E1122 Type 2 diabetes mellitus with diabetic chronic kidney disease: Secondary | ICD-10-CM | POA: Diagnosis not present

## 2019-04-07 DIAGNOSIS — Z79899 Other long term (current) drug therapy: Secondary | ICD-10-CM | POA: Diagnosis not present

## 2019-04-07 DIAGNOSIS — G8929 Other chronic pain: Secondary | ICD-10-CM | POA: Diagnosis not present

## 2019-04-07 DIAGNOSIS — N183 Chronic kidney disease, stage 3 (moderate): Secondary | ICD-10-CM | POA: Insufficient documentation

## 2019-04-07 DIAGNOSIS — Z833 Family history of diabetes mellitus: Secondary | ICD-10-CM | POA: Diagnosis not present

## 2019-04-07 DIAGNOSIS — Z888 Allergy status to other drugs, medicaments and biological substances status: Secondary | ICD-10-CM | POA: Diagnosis not present

## 2019-04-07 DIAGNOSIS — Z841 Family history of disorders of kidney and ureter: Secondary | ICD-10-CM | POA: Insufficient documentation

## 2019-04-07 DIAGNOSIS — D6859 Other primary thrombophilia: Secondary | ICD-10-CM

## 2019-04-07 DIAGNOSIS — Z86711 Personal history of pulmonary embolism: Secondary | ICD-10-CM | POA: Diagnosis not present

## 2019-04-07 DIAGNOSIS — Z7901 Long term (current) use of anticoagulants: Secondary | ICD-10-CM | POA: Insufficient documentation

## 2019-04-07 DIAGNOSIS — M353 Polymyalgia rheumatica: Secondary | ICD-10-CM | POA: Diagnosis not present

## 2019-04-07 DIAGNOSIS — Z8719 Personal history of other diseases of the digestive system: Secondary | ICD-10-CM | POA: Diagnosis not present

## 2019-04-07 DIAGNOSIS — R12 Heartburn: Secondary | ICD-10-CM | POA: Diagnosis not present

## 2019-04-07 DIAGNOSIS — M50221 Other cervical disc displacement at C4-C5 level: Secondary | ICD-10-CM | POA: Diagnosis not present

## 2019-04-07 DIAGNOSIS — I313 Pericardial effusion (noninflammatory): Secondary | ICD-10-CM | POA: Insufficient documentation

## 2019-04-07 NOTE — Telephone Encounter (Signed)
Per 8/27 los RTC with Dr Irene Limbo as needed

## 2019-04-07 NOTE — Progress Notes (Signed)
HEMATOLOGY/ONCOLOGY CONSULTATION NOTE  Date of Service: 04/07/2019  Patient Care Team: Bonnita Hollow, MD as PCP - General (Family Medicine) Marica Otter, [place] (Optometry) Jamal Maes, MD (Nephrology) Karie Chimera, MD as Consulting Physician (Neurosurgery)  CHIEF COMPLAINTS/PURPOSE OF CONSULTATION:  PE  HISTORY OF PRESENTING ILLNESS:   Erin Coffey is a wonderful 73 y.o. female who has been referred to [name] by Dr. Dorris Singh for evaluation and management of PE. The pt reports that she is doing well overall.  The pt reports that she went to the ED because she was feeling some heaviness on her chest and difficulty breathing. She was not anxious or stressed at the time. She pt is still experiencing this heaviness now. It does not worsen when she is walking or exerting herself, but is worsens when she sits down. She has been using topical Voltaren on her chest, which helps.  The pt has also been experiencing heartburn. She was taken off of Prevacid and put on Pepcid.  Of note prior to the patient's visit today, pt has had CT angio chest w contrast completed on 03/03/2019 with results revealing "Mild pulmonary emboli in the right lower lobe without evidence of right heart strain. Aortic Atherosclerosis (ICD10-I70.0)."   She has also had CT angio chest w contrast completed on 03/18/2019 with results revealing "1. No evidence for recurrent acute pulmonary embolism or other acute chest process. 2. Stable cardiomegaly and small pericardial effusion. 3. Mild Aortic Atherosclerosis (ICD10-I70.0)."  Lastly, the pt had an [name] RLE venous completed on 03/10/2019 with results revealing "Right: There is no evidence of deep vein thrombosis in the lower extremity."  Most recent lab results (03/18/2019) of CBC and BMP is as follows: all values are WNL except for RBC at 3.84, HGB at 11.0, Hct at 35.0, RDW at 15.8, Creatinine at 1.27, GFR at 49.  On review of systems, pt reports heaviness in  her chest and denies SOB, belly pain, changing in bowel habits and any other symptoms.   On PMHx the pt reports hx of DVT in 2014, hx of PE in 2012, has been on Coumadin since 2012 On Family Hx the pt denies a hx of blood or clotting disorders. Her sister passed from leukemia.  MEDICAL HISTORY:  Past Medical History:  Diagnosis Date   Allergy    Anemia, iron deficiency    ANXIETY, SITUATIONAL 04/06/2009   Qualifier: Diagnosis of  By: [name]  MD, Colletta [place]     Arthritis    BACK PAIN W/RADIATION, UNSPECIFIED 10/08/2006   Qualifier: Diagnosis of  By: [name]     Back pain, thoracic 12/20/2012   Carpal tunnel syndrome 12/07/2014   Cholelithiasis 06/25/2012   Chronic kidney disease    Diabetes mellitus    DM type 2 goal A1C below 7.5 06/05/2008   Qualifier: Diagnosis of  By: [name]  MD, Stephanie     GASTROESOPHAGEAL REFLUX, NO ESOPHAGITIS 10/08/2006   Qualifier: Diagnosis of  By: [name]     GERD (gastroesophageal reflux disease)    Goiter, unspecified 10/21/2007   Qualifier: Diagnosis of  ByHoy Morn MD, [name]     History of DVT (deep vein thrombosis) 05/05/2013   Hx of pulmonary embolus 05/08/2011   HYPERLIPIDEMIA 08/08/2008   Qualifier: Diagnosis of  By: [name]  MD, Stephanie     Hypertension    HYPERTENSION, BENIGN ESSENTIAL 11/27/2006   Qualifier: Diagnosis of  ByHoy Morn MD, [name]     Left tennis elbow 11/02/2014  Long term (current) use of anticoagulants 05/08/2011   Palpitations 10/01/2012   Personal history of colonic polyps 10/24/2009   Qualifier: Diagnosis of  By: [name] MD, Sandy Salaam    Pica 08/08/2009   Qualifier: Diagnosis of  By: [name], [name]     Polymyalgia rheumatica ([place]) 10/02/2009   Qualifier: Diagnosis of  By: [name] MD, Rachel     Pulmonary embolism ([place]) 04/28/11   RENAL FAILURE 10/18/2009   Qualifier: History of  By: [name]  MD, Stephanie     Thyroid disease    Trigger ring finger of left hand 11/02/2014   UNSPECIFIED IRON DEFICIENCY  ANEMIA 10/02/2009   Qualifier: Diagnosis of  By: [name] MD, Apolonio Schneiders      SURGICAL HISTORY: Past Surgical History:  Procedure Laterality Date   ABDOMINAL HYSTERECTOMY     CARDIAC CATHETERIZATION  04/29/11   Clean cardiac cath, no blockages.    CARPAL TUNNEL RELEASE Right 1996   KNEE ARTHROSCOPY  Bilateral   TUBAL LIGATION      SOCIAL HISTORY: Social History   Socioeconomic History   Marital status: Widowed    Spouse name: Not on file   Number of children: 3   Years of education: 10   Highest education level: Not on file  Occupational History   Occupation: Retired- Airline pilot: [place] resource strain: Not very hard   Food insecurity    Worry: Never true    Inability: Never true   Transportation needs    Medical: No    Non-medical: No  Tobacco Use   Smoking status: Never Smoker   Smokeless tobacco: Never Used  Substance and Sexual Activity   Alcohol use: No    Alcohol/week: 0.0 standard drinks   Drug use: No   Sexual activity: Not Currently  Lifestyle   Physical activity    Days per week: 3 days    Minutes per session: 60 min   Stress: Not at all  Relationships   Social connections    Talks on phone: More than three times a week    Gets together: Once a week    Attends religious service: More than 4 times per year    Active member of club or organization: Yes    Attends meetings of clubs or organizations: More than 4 times per year    Relationship status: Widowed   Intimate partner violence    Fear of current or ex partner: No    Emotionally abused: No    Physically abused: No    Forced sexual activity: No  Other Topics Concern   Not on file  Social History Narrative   Health Care POA:    Emergency Contact: 1. Baltazar Apo [phone] (First contact)                                      2. daughter, Marlene Lard, [phone] (second contact)   Two dgt and one son   End of Life Plan: gave pt  AD information phamplet   Who lives with you: self   Any pets: none   Diet: Pt has a varied diet of protein, starch and vegetables.   Exercise: Pt reports being very active   Seatbelts: Pt reports wearing seatbelt when in vehicles.    Hobbies: cooking, crossword puzzles.       FAMILY HISTORY: Family History  Problem  Relation Age of Onset   Diabetes Mother    Heart disease Mother    Hypertension Mother    Heart disease Father    Hypertension Father    Diabetes Sister    Heart disease Sister    Hypertension Brother    Cancer Sister 73       lukemia   Cirrhosis Sister        alcohol   Kidney disease Sister    Diabetes Sister    Diabetes Brother    Colon cancer Neg Hx     ALLERGIES:  is allergic to lisinopril.  MEDICATIONS:  Current Outpatient Medications  Medication Sig Dispense Refill   acetaminophen (TYLENOL) 650 MG CR tablet Take 1,300 mg by mouth every 8 (eight) hours as needed for pain.     alendronate (FOSAMAX) 70 MG tablet Take 1 tablet (70 mg total) by mouth every 7 (seven) days. Take with a full glass of water on an empty stomach. 4 tablet 11   amLODipine (NORVASC) 5 MG tablet Take 1 tablet (5 mg total) by mouth at bedtime. 90 tablet 3   apixaban (ELIQUIS) 5 MG TABS tablet Take 1 tablet (5 mg total) by mouth 2 (two) times daily. Begin after completing starterpack for eliquis 180 tablet 3   atorvastatin (LIPITOR) 40 MG tablet Take 1 tablet (40 mg total) by mouth daily. (Patient taking differently: Take 40 mg by mouth every Tuesday, Thursday, and Saturday at 6 PM. ) 90 tablet 3   baclofen (LIORESAL) 10 MG tablet Take 1 tablet (10 mg total) by mouth daily as needed for muscle spasms. 30 each 0   Blood Glucose Monitoring Suppl (ONETOUCH VERIO) w/Device KIT Use as instructed to test sugars once daily.  Dx Code: E11.9 1 kit 0   calcium-vitamin D (OSCAL-500) 500-400 MG-UNIT tablet Take 1 tablet by mouth 2 (two) times daily. (Patient taking differently:  Take 1 tablet by mouth daily. ) 60 tablet 0   diclofenac sodium (VOLTAREN) 1 % GEL Apply 2 g topically 4 (four) times daily. (Patient taking differently: Apply 1 application topically at bedtime. Apply to shoulders)     Eliquis DVT/PE Starter Pack (ELIQUIS STARTER PACK) 5 MG TABS Take as directed on package: start with two-'5mg'$  tablets twice daily for 7 days. On day 8, switch to one-'5mg'$  tablet twice daily. (Patient taking differently: Take 5-10 mg by mouth See admin instructions. Ordered 03/04/19:  Take two-'5mg'$  tablets twice daily for 7 days. On day 8, switch to one-'5mg'$  tablet twice daily.) 1 each 0   famotidine (PEPCID) 20 MG tablet Take 1 tablet (20 mg total) by mouth 2 (two) times daily. 180 tablet 3   fish oil-omega-3 fatty acids 1000 MG capsule Take 1 g by mouth daily.      gabapentin (NEURONTIN) 100 MG capsule Take 1 capsule (100 mg total) by mouth 3 (three) times daily. 90 capsule 3   glucose blood (ONETOUCH VERIO) test strip Use as instructed to test sugars once daily.  Dx Code: E11.9 100 each 12   glucose blood test strip Use as instructed 100 each 12   iron polysaccharides (FERREX 150) 150 MG capsule Take 1 capsule (150 mg total) by mouth daily. 60 capsule 5   ketoconazole (NIZORAL) 2 % cream APPLY TO AFFECTED AREA TWICE A DAY (Patient taking differently: Apply 1 application topically daily as needed (Rash). ) 60 g 0   Lancet Devices (ONE TOUCH DELICA LANCING DEV) MISC Use as instructed to test sugars once daily.  Dx Code: E11.9 1 each 0   losartan (COZAAR) 25 MG tablet Take 1 tablet (25 mg total) by mouth at bedtime. 90 tablet 3   metoprolol tartrate (LOPRESSOR) 25 MG tablet Take 1 tablet by mouth twice daily (Patient taking differently: Take 25 mg by mouth 2 (two) times daily. ) 180 tablet 0   OneTouch Delica Lancets 29W MISC Use as instructed to test sugars once daily.  Dx Code: E11.9 100 each 12   polyethylene glycol powder (GLYCOLAX/MIRALAX) powder Take 17 g by mouth 2 (two)  times daily as needed. (Patient taking differently: Take 17 g by mouth 2 (two) times daily as needed (constipation). ) 3350 g 1   triamcinolone cream (KENALOG) 0.1 % apply to affected area twice a day (Patient taking differently: Apply 1 application topically daily as needed (itching). ) 30 g 2   No current facility-administered medications for this visit.     REVIEW OF SYSTEMS:    A 10+ POINT REVIEW OF SYSTEMS WAS OBTAINED including neurology, dermatology, psychiatry, cardiac, respiratory, lymph, extremities, GI, GU, Musculoskeletal, constitutional, breasts, reproductive, HEENT.  All pertinent positives are noted in the HPI.  All others are negative.   PHYSICAL EXAMINATION: ECOG PERFORMANCE STATUS: 1 - Symptomatic but completely ambulatory  . Vitals:   04/07/19 0958  BP: (!) 155/63  Pulse: 70  Resp: 18  Temp: 98.7 F (37.1 C)  SpO2: 100%   Filed Weights   04/07/19 0958  Weight: (!) 300 lb 1.6 oz (136.1 kg)   .Body mass index is 44.32 kg/m.  GENERAL:alert, in no acute distress and comfortable SKIN: no acute rashes, no significant lesions EYES: conjunctiva are pink and non-injected, sclera anicteric OROPHARYNX: MMM, no exudates, no oropharyngeal erythema or ulceration NECK: supple, no JVD LYMPH:  no palpable lymphadenopathy in the cervical, axillary or inguinal regions LUNGS: clear to auscultation b/l with normal respiratory effort HEART: regular rate & rhythm ABDOMEN:  normoactive bowel sounds , non tender, not distended. Extremity: no pedal edema PSYCH: alert & oriented x 3 with fluent speech NEURO: no focal motor/sensory deficits  LABORATORY DATA:  I have reviewed the data as listed  . CBC Latest Ref Rng & Units 03/18/2019 03/07/2019 03/04/2019  WBC 4.0 - 10.5 K/uL 4.8 4.8 4.1  Hemoglobin 12.0 - 15.0 g/dL 11.0(L) 11.1 10.8(L)  Hematocrit 36.0 - 46.0 % 35.0(L) 33.5(L) 32.9(L)  Platelets 150 - 400 K/uL 216 203 172    . CMP Latest Ref Rng & Units 03/24/2019 03/18/2019  03/07/2019  Glucose 70 - 99 mg/dL - 85 92  BUN 8 - 23 mg/dL - 15 13  Creatinine 0.44 - 1.00 mg/dL - 1.27(H) 1.20(H)  Sodium 135 - 145 mmol/L - 139 141  Potassium 3.5 - 5.1 mmol/L - 4.6 4.6  Chloride 98 - 111 mmol/L - 105 104  CO2 22 - 32 mmol/L - 23 21  Calcium 8.9 - 10.3 mg/dL - 9.6 9.3  Total Protein 6.0 - 8.5 g/dL 8.1 - 7.7  Total Bilirubin 0.0 - 1.2 mg/dL 0.5 - 0.6  Alkaline Phos 39 - 117 IU/L 70 - 73  AST 0 - 40 IU/L 26 - 147(H)  ALT 0 - 32 IU/L 17 - 89(H)     RADIOGRAPHIC STUDIES: I have personally reviewed the radiological images as listed and agreed with the findings in the report. Ct Angio Chest Pe W/cm &/or Wo Cm  Result Date: 03/18/2019 CLINICAL DATA:  Shortness of breath. High pretest probability of pulmonary embolism clinically. EXAM: CT ANGIOGRAPHY CHEST  WITH CONTRAST TECHNIQUE: Multidetector CT imaging of the chest was performed using the standard protocol during bolus administration of intravenous contrast. Multiplanar CT image reconstructions and MIPs were obtained to evaluate the vascular anatomy. CONTRAST:  160m OMNIPAQUE IOHEXOL 350 MG/ML SOLN COMPARISON:  Chest CTA 03/03/2019.  Radiographs 03/03/2019. FINDINGS: Cardiovascular: The pulmonary arteries are well opacified with contrast to the level of the subsegmental branches. There is no evidence of acute pulmonary embolism. There is mild atherosclerosis of the aorta, great vessels and coronary arteries without acute vascular findings. Stable mild cardiomegaly and a small pericardial effusion. Mediastinum/Nodes: There are no enlarged mediastinal, hilar or axillary lymph nodes.There are stable small axillary lymph nodes bilaterally and a small hiatal hernia. The trachea and thyroid gland appear unremarkable. Lungs/Pleura: There is no pleural effusion or pneumothorax. The lungs remain clear. Upper abdomen: Diffuse hepatic low density consistent with steatosis. No acute findings. Musculoskeletal/Chest wall: There is no chest wall  mass or suspicious osseous finding. Review of the MIP images confirms the above findings. IMPRESSION: 1. No evidence for recurrent acute pulmonary embolism or other acute chest process. 2. Stable cardiomegaly and small pericardial effusion. 3. Mild Aortic Atherosclerosis (ICD10-I70.0). Electronically Signed   By: WRichardean SaleM.D.   On: 03/18/2019 17:29   Mr Cervical Spine Wo Contrast  Result Date: 03/18/2019 CLINICAL DATA:  Left-sided shoulder pain EXAM: MRI CERVICAL SPINE WITHOUT CONTRAST TECHNIQUE: Multiplanar, multisequence MR imaging of the cervical spine was performed. No intravenous contrast was administered. COMPARISON:  None. FINDINGS: Alignment: Normal Vertebrae: Normal Cord: Normal Posterior Fossa, vertebral arteries, paraspinal tissues: Visualized posterior fossa is normal. Vertebral artery flow voids are preserved. No prevertebral soft tissue swelling. Disc levels: C2-3: Disc desiccation without herniation.  No stenosis. C3-4: Disc desiccation. No disc herniation. No stenosis. Normal facets. C4-5 small right subarticular disc protrusion without spinal canal or neural foraminal stenosis. Normal facets. C5-6: Normal disc and facets.  No stenosis. C6-7: Small central protrusion without spinal canal or neural foraminal stenosis. C7-T1: Normal. T1-2: Small central protrusion without spinal canal stenosis. There is narrowing of the ventral thecal sac with indentation anterior spinal cord. Thecal space is widely patent posteriorly. T2-3: Small bulge without stenosis. IMPRESSION: Multilevel mild degenerative disc disease without spinal canal or neural foraminal stenosis. Electronically Signed   By: KUlyses JarredM.D.   On: 03/18/2019 23:45   Mr Shoulder Left Wo Contrast  Result Date: 03/19/2019 CLINICAL DATA:  Chronic shoulder pain.  Labral tear suspected. EXAM: MRI OF THE LEFT SHOULDER WITHOUT CONTRAST TECHNIQUE: Multiplanar, multisequence MR imaging of the shoulder was performed. No intravenous  contrast was administered. COMPARISON:  Radiographs 12/07/2008. FINDINGS: Rotator cuff: There is a large full-thickness rotator cuff tear. There is a complete insertional tear of the supraspinatus tendon which is moderately retracted to approximately the level of the superior labrum. The anterior fibers of the infraspinatus tendon are also torn and partially retracted. There are bone fragments imbedded within the retracted infraspinatus tendon (best seen on the sagittal T1 weighted images). There is subscapularis tendinosis without tear. The teres minor tendon is intact. Muscles: Mild supraspinatus and infraspinatus muscular atrophy. No focal muscular edema. Biceps long head: Tendinosis of the intra-articular portion. The tendon is medially subluxed from the superior aspect of the bicipital groove, although is normally located distally. Acromioclavicular Joint: The acromion is type 1. There are moderate acromioclavicular degenerative changes. A small amount of fluid is present in the subacromial-subdeltoid bursa, communicating with the shoulder joint via the rotator cuff tear. Glenohumeral Joint: The  humeral head is high-riding with obliteration of the subacromial space. No significant shoulder joint effusion or glenohumeral arthropathy. Labrum: Superior labral degeneration without discrete tear or paralabral cyst. Bones: No acute or significant extra-articular osseous findings. Other: Mildly prominent lymph nodes are noted within the left axilla, not significantly enlarged and likely reactive. IMPRESSION: 1. Large full-thickness rotator cuff tear as described. The supraspinatus tendon is completely torn and moderately retracted. The anterior fibers of the infraspinatus tendon are torn with imbedded bone fragments. Mild supraspinatus and infraspinatus muscular atrophy. 2. Bicipital tendinosis and partial tearing. 3. The labrum appears intact. Electronically Signed   By: Richardean Sale M.D.   On: 03/19/2019 09:01    Vas [name] Lower Extremity Venous (dvt)  Result Date: 03/10/2019  Lower Venous Study Indications: Pain.  Risk Factors: Hx PE DVT Hx. Comparison Study: 10/24/16 negative Performing Technologist: June Leap RDMS, RVT  Examination Guidelines: A complete evaluation includes B-mode imaging, spectral Doppler, color Doppler, and power Doppler as needed of all accessible portions of each vessel. Bilateral testing is considered an integral part of a complete examination. Limited examinations for reoccurring indications may be performed as noted.  +---------+---------------+---------+-----------+----------+-------+  RIGHT     Compressibility Phasicity Spontaneity Properties Summary  +---------+---------------+---------+-----------+----------+-------+  CFV       Full            Yes       Yes                             +---------+---------------+---------+-----------+----------+-------+  SFJ       Full                                                      +---------+---------------+---------+-----------+----------+-------+  FV Prox   Full                                                      +---------+---------------+---------+-----------+----------+-------+  FV Mid    Full                                                      +---------+---------------+---------+-----------+----------+-------+  FV Distal Full                                                      +---------+---------------+---------+-----------+----------+-------+  PFV       Full                                                      +---------+---------------+---------+-----------+----------+-------+  POP       Full            Yes  Yes                             +---------+---------------+---------+-----------+----------+-------+  PTV       Full                                                      +---------+---------------+---------+-----------+----------+-------+  PERO      Full                                                       +---------+---------------+---------+-----------+----------+-------+     Summary: Right: There is no evidence of deep vein thrombosis in the lower extremity.  *See table(s) above for measurements and observations. Electronically signed by Monica Martinez MD on 03/10/2019 at 4:39:24 PM.    Final     ASSESSMENT & PLAN:   #1. H/o Saddle Pulmonary Embolism  -She had a unprovoked Saddle PE on 04/28/11 -Currently on Warfarin since 04/2011 -No significant family history of blood or clotting disorders.  -She is a non-smoker   #2 Recent PE -Cannot be certain if the clot is new or left over from previous clots. -After being on Eliquis for 2 wks, the clot had resolved.so assume this was new. Uncertain that this represented coumadin failure. -No evidence of DVT  03/03/2019 CT angio chest w contrast revealed "Mild pulmonary emboli in the right lower lobe without evidence of right heart strain. Aortic Atherosclerosis (ICD10-I70.0)."   03/10/2019 [name] RLE venous revealed "Right: There is no evidence of deep vein thrombosis in the lower extremity."  03/18/2019 CT angio chest w contrast revealed "1. No evidence for recurrent acute pulmonary embolism or other acute chest process. 2. Stable cardiomegaly and small pericardial effusion. 3. Mild Aortic Atherosclerosis (ICD10-I70.0)."  PLAN: -Discussed 03/03/2019 CT angio chest w contrast which revealed mild pulmonary emboli in the right lower lobe  -Discussed 03/18/2019 CT angio chest w contrast which revealed no evidence of recurrent PE -Discussed that it is unclear whether this is a new clot or left over from her previous clots in 2012 or truly a new clot. -Discussed that her chest pain does not appear to be caused by her blood clots -- likely costochondritis. -In general, her Coumadin levels have been stable. I would not say that her clot is due to Coumadin failure at this time. Smoking, weight changes, and relatively sedentary lifestyle play a role in her  risk for blood clots. -Discussed that the Eliquis dose may not be adequate for the pt because she weighs >136 kg but it does appears to have resolved the visibile small PE  -Discussed that the pt will be on life-long blood thinners. -Recommended that the pt continue to eat well, drink at least 48-64 oz of water each day, and walk 20-30 minutes each day. -Discussed need for absolute smoking cessation, diet , exercise and weight loss. -Recommend waiting at least 4 months before having elective surgery for torn rotator cuff -Recommend watching kidney function and re-checking light chains with nephrologist, pt has no M spike -Will see the pt back prn -Continue chronic anticoagulation with PCP   RTC with Dr Irene Limbo as needed  No orders of the defined types were placed in this encounter.  All of the patients questions were answered with apparent satisfaction. The patient knows to call the clinic with any problems, questions or concerns.  I spent 83mns counseling the patient face to face. The total time spent in the appointment was 41ms and more than 50% was on counseling and direct patient cares.  GaSullivan LoneD MS AAHIVMS SCChristus Mother Frances Hospital JacksonvilleTCarlsbad Medical Centerematology/Oncology Physician CoBig Island Endoscopy Center(Office):       [phone] cell):  [phone]):           [phone]/27/2020 9:02 AM  I, EmDe Burrsam acting as a scribe for Dr. KaIrene Limbo.I have reviewed the above documentation for accuracy and completeness, and I agree with the above. .GBrunetta GeneraD

## 2019-04-27 ENCOUNTER — Other Ambulatory Visit: Payer: Self-pay | Admitting: Family Medicine

## 2019-04-27 DIAGNOSIS — Z8781 Personal history of (healed) traumatic fracture: Secondary | ICD-10-CM

## 2019-04-27 DIAGNOSIS — E2839 Other primary ovarian failure: Secondary | ICD-10-CM

## 2019-04-29 ENCOUNTER — Other Ambulatory Visit: Payer: Self-pay | Admitting: Family Medicine

## 2019-05-05 ENCOUNTER — Other Ambulatory Visit: Payer: Medicare Other

## 2019-05-05 ENCOUNTER — Ambulatory Visit
Admission: RE | Admit: 2019-05-05 | Discharge: 2019-05-05 | Disposition: A | Discharge status: Home / Self Care | Payer: Medicare Other | Source: Ambulatory Visit | Attending: Family Medicine | Admitting: Family Medicine

## 2019-05-05 ENCOUNTER — Other Ambulatory Visit: Payer: Self-pay

## 2019-05-05 ENCOUNTER — Ambulatory Visit (INDEPENDENT_AMBULATORY_CARE_PROVIDER_SITE_OTHER): Payer: Medicare Other | Admitting: *Deleted

## 2019-05-05 DIAGNOSIS — Z8781 Personal history of (healed) traumatic fracture: Secondary | ICD-10-CM

## 2019-05-05 DIAGNOSIS — Z23 Encounter for immunization: Secondary | ICD-10-CM | POA: Diagnosis not present

## 2019-05-05 DIAGNOSIS — Z13820 Encounter for screening for osteoporosis: Secondary | ICD-10-CM | POA: Diagnosis not present

## 2019-05-05 DIAGNOSIS — E2839 Other primary ovarian failure: Secondary | ICD-10-CM

## 2019-05-05 DIAGNOSIS — Z780 Asymptomatic menopausal state: Secondary | ICD-10-CM | POA: Diagnosis not present

## 2019-05-05 NOTE — Progress Notes (Signed)
Pt tolerated vaccine well. Hadlyn Amero, CMA  

## 2019-05-20 ENCOUNTER — Other Ambulatory Visit: Payer: Self-pay

## 2019-05-20 ENCOUNTER — Telehealth (INDEPENDENT_AMBULATORY_CARE_PROVIDER_SITE_OTHER): Payer: Medicare Other | Admitting: Family Medicine

## 2019-05-20 DIAGNOSIS — M545 Low back pain, unspecified: Secondary | ICD-10-CM

## 2019-05-20 DIAGNOSIS — G8929 Other chronic pain: Secondary | ICD-10-CM | POA: Diagnosis not present

## 2019-05-20 DIAGNOSIS — M810 Age-related osteoporosis without current pathological fracture: Secondary | ICD-10-CM

## 2019-05-20 NOTE — Progress Notes (Signed)
[facility] Tele[facility] Visit  Patient consented to have virtual visit. Method of visit: Telephone  Encounter participants: Patient: Erin Coffey - located at home Provider: Bonnita Hollow - located at office Others (if applicable): None  Chief Complaint: discuss "bone scan"  HPI: Osteoporosis Patient had virtual visit today to discuss her DEXA scan results. Patient with known L1 fragility fracture.  Had DEXA scan that showed consistent with osteopenia.  Patient has been optimized on vitamin D/calcium supplementation and started on alendronate 70 mg weekly.  Discussed results with patient.  She is tolerating her medications well.  Chronic back pain Patient complaining of back pain as well.  Back pain is chronic.  Denies any loss of bowel or bladder function.  Denies any weakness.  Denies any recent trauma.  Denies any worsening back pain.  Patient was prescribed tramadol in 12/2017.  She took it very sparingly, once or twice a month.  She is curious what she did have this refilled.  I told her given the chronic nature of her back pain and limit 10 amount of tramadol that she uses, we would need to see her back in the clinic before refilling this.  PDMP was checked to verify this.  Patient was agreed with this.   ROS: per HPI  Pertinent PMHx: Fragility fracture  Exam:  Respiratory: Able speak in full sentence without issue.  Assessment/Plan: Osteoporosis diagnosed clinically Patient optimized on vitamin D and calcium.  Patient also on bisphosphonate.  Will scan patient again in 2 years to see how bone density is managed with this.  Back pain, chronic Not worsening.  Given that this is been a little while since patient was seen for this and been over a year since she was prescribed any opioids for this.  Will have patient follow-up in clinic to discuss further.  Appointment scheduled for next Monday at 8:30 AM. Time spent during visit with patient: 15  minutes

## 2019-05-24 ENCOUNTER — Encounter: Payer: Self-pay | Admitting: Family Medicine

## 2019-05-24 ENCOUNTER — Ambulatory Visit (INDEPENDENT_AMBULATORY_CARE_PROVIDER_SITE_OTHER): Payer: Medicare Other | Admitting: Family Medicine

## 2019-05-24 ENCOUNTER — Other Ambulatory Visit: Payer: Self-pay

## 2019-05-24 VITALS — BP 136/78 | HR 100 | Ht 69.0 in | Wt 300.0 lb

## 2019-05-24 DIAGNOSIS — M545 Low back pain, unspecified: Secondary | ICD-10-CM

## 2019-05-24 DIAGNOSIS — G8929 Other chronic pain: Secondary | ICD-10-CM | POA: Diagnosis not present

## 2019-05-24 MED ORDER — TRAMADOL HCL 50 MG PO TABS: 50.0000 mg | ORAL_TABLET | Freq: Four times a day (QID) | ORAL | 0 refills | Status: DC | PRN

## 2019-05-24 NOTE — Assessment & Plan Note (Signed)
Chronic lower back pain.  Stable.  No red flag symptoms.  Intermittent tramadol use.  Will refill.  Patient to follow-up if worsening.

## 2019-05-24 NOTE — Progress Notes (Signed)
    Subjective:  Erin Coffey is a 73 y.o. female who presents to the [facility] today with a chief complaint of back pain.   HPI:  Patient presenting with chronic back pain.  She has had it for several years.  It is not worsening.  Patient says that her back pain is worse when standing up for several hours.  It does get better with sitting down.  Patient does take Tylenol.  This does help.  Patient had a prescription for tramadol prescribed in 12/2017.  She said that she recently ran out.  Patient said that she would take a tramadol once or twice a month just for breakthrough pain.  Patient has tried physical therapy in the past, said that it actually left her feeling worse than when she did it.  Patient also attributes her back pain to increased weight.  Patient says she is to try to be more active recently but that stationary bike.  Patient denies any history of cancer.  Denies any recent trauma.  MR pelvis and lumbar spine in 07/2018 showed multiple foraminal stenosis, disc bulging, arthro-thesis throughout the lumbar spine.  Patient denies ever having any lower back surgeries.  Patient cannot take NSAIDs due to chronic anticoagulation.  ROS: Per HPI   Objective:  Physical Exam: BP 136/78   Pulse 100   Ht 5\' 9"  (1.753 m)   Wt 300 lb (136.1 kg)   SpO2 98%   BMI 44.30 kg/m   Gen: NAD, resting comfortably Pulm: NWOB,  MSK: 5 of 5 strength in lower extremities, normal sensation throughout, lower back without any gross deformity, lower back nontender to palpation bilaterally, able to ambulate without use of assistance device Skin: warm, dry Neuro: grossly normal, moves all extremities Psych: Normal affect and thought content  No results found for this or any previous visit (from the past 72 hour(s)).   Assessment/Plan:  No problem-specific Assessment & Plan notes found for this encounter.   Lab Orders  No laboratory test(s) ordered today    No orders of the defined types were  placed in this encounter.     Marny Lowenstein, MD, MS FAMILY MEDICINE RESIDENT - PGY2 05/24/2019 8:34 AM

## 2019-05-24 NOTE — Patient Instructions (Signed)
It was a pleasure to see you today! Thank you for choosing [facility] for your primary care. Erin Coffey was seen for back pain.   We are prescribing you tramadol for your back pain.  You may take this as needed.  Please come back if your back pain worse or you need refill on medication.   Best,  Marny Lowenstein, MD, MS FAMILY MEDICINE RESIDENT - PGY3 05/24/2019 8:44 AM

## 2019-06-03 ENCOUNTER — Encounter: Payer: Self-pay | Admitting: Podiatry

## 2019-06-03 ENCOUNTER — Ambulatory Visit: Payer: Medicare Other | Admitting: Podiatry

## 2019-06-03 ENCOUNTER — Other Ambulatory Visit: Payer: Self-pay

## 2019-06-03 DIAGNOSIS — E119 Type 2 diabetes mellitus without complications: Secondary | ICD-10-CM

## 2019-06-03 DIAGNOSIS — B351 Tinea unguium: Secondary | ICD-10-CM

## 2019-06-03 DIAGNOSIS — M79675 Pain in left toe(s): Secondary | ICD-10-CM

## 2019-06-03 DIAGNOSIS — Z9229 Personal history of other drug therapy: Secondary | ICD-10-CM | POA: Diagnosis not present

## 2019-06-03 DIAGNOSIS — M79674 Pain in right toe(s): Secondary | ICD-10-CM | POA: Diagnosis not present

## 2019-06-03 NOTE — Patient Instructions (Signed)
Diabetes Mellitus and Foot Care Foot care is an important part of your health, especially when you have diabetes. Diabetes may cause you to have problems because of poor blood flow (circulation) to your feet and legs, which can cause your skin to:  Become thinner and drier.  Break more easily.  Heal more slowly.  Peel and crack. You may also have nerve damage (neuropathy) in your legs and feet, causing decreased feeling in them. This means that you may not notice minor injuries to your feet that could lead to more serious problems. Noticing and addressing any potential problems early is the best way to prevent future foot problems. How to care for your feet Foot hygiene  Wash your feet daily with warm water and mild soap. Do not use hot water. Then, pat your feet and the areas between your toes until they are completely dry. Do not soak your feet as this can dry your skin.  Trim your toenails straight across. Do not dig under them or around the cuticle. File the edges of your nails with an emery board or nail file.  Apply a moisturizing lotion or petroleum jelly to the skin on your feet and to dry, brittle toenails. Use lotion that does not contain alcohol and is unscented. Do not apply lotion between your toes. Shoes and socks  Wear clean socks or stockings every day. Make sure they are not too tight. Do not wear knee-high stockings since they may decrease blood flow to your legs.  Wear shoes that fit properly and have enough cushioning. Always look in your shoes before you put them on to be sure there are no objects inside.  To break in new shoes, wear them for just a few hours a day. This prevents injuries on your feet. Wounds, scrapes, corns, and calluses  Check your feet daily for blisters, cuts, bruises, sores, and redness. If you cannot see the bottom of your feet, use a mirror or ask someone for help.  Do not cut corns or calluses or try to remove them with medicine.  If you  find a minor scrape, cut, or break in the skin on your feet, keep it and the skin around it clean and dry. You may clean these areas with mild soap and water. Do not clean the area with peroxide, alcohol, or iodine.  If you have a wound, scrape, corn, or callus on your foot, look at it several times a day to make sure it is healing and not infected. Check for: ? Redness, swelling, or pain. ? Fluid or blood. ? Warmth. ? Pus or a bad smell. General instructions  Do not cross your legs. This may decrease blood flow to your feet.  Do not use heating pads or hot water bottles on your feet. They may burn your skin. If you have lost feeling in your feet or legs, you may not know this is happening until it is too late.  Protect your feet from hot and cold by wearing shoes, such as at the beach or on hot pavement.  Schedule a complete foot exam at least once a year (annually) or more often if you have foot problems. If you have foot problems, report any cuts, sores, or bruises to your health care provider immediately. Contact a health care provider if:  You have a medical condition that increases your risk of infection and you have any cuts, sores, or bruises on your feet.  You have an injury that is not   healing.  You have redness on your legs or feet.  You feel burning or tingling in your legs or feet.  You have pain or cramps in your legs and feet.  Your legs or feet are numb.  Your feet always feel cold.  You have pain around a toenail. Get help right away if:  You have a wound, scrape, corn, or callus on your foot and: ? You have pain, swelling, or redness that gets worse. ? You have fluid or blood coming from the wound, scrape, corn, or callus. ? Your wound, scrape, corn, or callus feels warm to the touch. ? You have pus or a bad smell coming from the wound, scrape, corn, or callus. ? You have a fever. ? You have a red line going up your leg. Summary  Check your feet every day  for cuts, sores, red spots, swelling, and blisters.  Moisturize feet and legs daily.  Wear shoes that fit properly and have enough cushioning.  If you have foot problems, report any cuts, sores, or bruises to your health care provider immediately.  Schedule a complete foot exam at least once a year (annually) or more often if you have foot problems. This information is not intended to replace advice given to you by your health care provider. Make sure you discuss any questions you have with your health care provider. Document Released: 07/25/2000 Document Revised: 09/09/2017 Document Reviewed: 08/29/2016 Elsevier Patient Education  2020 [employer]  

## 2019-06-05 NOTE — Progress Notes (Signed)
Subjective: Erin Coffey is seen today for follow up painful, elongated, thickened toenails 1-5 b/l feet that she cannot cut. Pain interferes with daily activities. Aggravating factor includes wearing enclosed shoe gear and relieved with periodic debridement.  Pt is on long term blood thinner, Eliquis and she is also diabetic.   She voices no new pedal problems on today's visit.  Current Outpatient Medications on File Prior to Visit  Medication Sig  . acetaminophen (TYLENOL) 650 MG CR tablet Take 1,300 mg by mouth every 8 (eight) hours as needed for pain.  [name] alendronate (FOSAMAX) 70 MG tablet Take 1 tablet (70 mg total) by mouth every 7 (seven) days. Take with a full glass of water on an empty stomach.  [name] amLODipine (NORVASC) 5 MG tablet Take 1 tablet (5 mg total) by mouth at bedtime.  [name] apixaban (ELIQUIS) 5 MG TABS tablet Take 1 tablet (5 mg total) by mouth 2 (two) times daily. Begin after completing starterpack for eliquis  . atorvastatin (LIPITOR) 40 MG tablet Take 1 tablet (40 mg total) by mouth daily. (Patient taking differently: Take 40 mg by mouth every Tuesday, Thursday, and Saturday at 6 PM. )  . baclofen (LIORESAL) 10 MG tablet Take 1 tablet (10 mg total) by mouth daily as needed for muscle spasms.  . Blood Glucose Monitoring Suppl (ONETOUCH VERIO) w/Device KIT Use as instructed to test sugars once daily.  Dx Code: E11.9  . calcium-vitamin D (OSCAL-500) 500-400 MG-UNIT tablet Take 1 tablet by mouth 2 (two) times daily. (Patient taking differently: Take 1 tablet by mouth daily. )  . diclofenac sodium (VOLTAREN) 1 % GEL Apply 2 g topically 4 (four) times daily. (Patient taking differently: Apply 1 application topically at bedtime. Apply to shoulders)  . Eliquis DVT/PE Starter Pack (ELIQUIS STARTER PACK) 5 MG TABS Take as directed on package: start with two-'5mg'$  tablets twice daily for 7 days. On day 8, switch to one-'5mg'$  tablet twice daily. (Patient taking differently: Take 5-10 mg by  mouth See admin instructions. Ordered 03/04/19:  Take two-'5mg'$  tablets twice daily for 7 days. On day 8, switch to one-'5mg'$  tablet twice daily.)  . famotidine (PEPCID) 20 MG tablet Take 1 tablet (20 mg total) by mouth 2 (two) times daily.  . fish oil-omega-3 fatty acids 1000 MG capsule Take 1 g by mouth daily.   [name] gabapentin (NEURONTIN) 100 MG capsule Take 1 capsule (100 mg total) by mouth 3 (three) times daily.  [name] glucose blood (ONETOUCH VERIO) test strip Use as instructed to test sugars once daily.  Dx Code: E11.9  . glucose blood test strip Use as instructed  . iron polysaccharides (FERREX 150) 150 MG capsule Take 1 capsule (150 mg total) by mouth daily.  [name] ketoconazole (NIZORAL) 2 % cream APPLY TO AFFECTED AREA TWICE A DAY (Patient taking differently: Apply 1 application topically daily as needed (Rash). )  . Lancet Devices (ONE TOUCH DELICA LANCING DEV) MISC Use as instructed to test sugars once daily.  Dx Code: E11.9  . losartan (COZAAR) 25 MG tablet Take 1 tablet (25 mg total) by mouth at bedtime.  . metoprolol tartrate (LOPRESSOR) 25 MG tablet Take 1 tablet by mouth twice daily  . OneTouch Delica Lancets 54M MISC Use as instructed to test sugars once daily.  Dx Code: E11.9  . polyethylene glycol powder (GLYCOLAX/MIRALAX) powder Take 17 g by mouth 2 (two) times daily as needed. (Patient taking differently: Take 17 g by mouth 2 (two) times daily as needed (constipation). )  .  traMADol (ULTRAM) 50 MG tablet Take 1 tablet (50 mg total) by mouth every 6 (six) hours as needed for moderate pain.  [name] triamcinolone cream (KENALOG) 0.1 % apply to affected area twice a day (Patient taking differently: Apply 1 application topically daily as needed (itching). )   No current facility-administered medications on file prior to visit.      Allergies  Allergen Reactions  . Lisinopril Cough    Objective:  Vascular Examination: Capillary refill time <3 seconds b/l.  Dorsalis pedis present  b/l.  Posterior tibial pulses present b/l.  Digital hair absent b/l.   Skin temperature gradient WNL b/l.   Dermatological Examination: Skin with normal turgor, texture and tone b/l.  Toenails 1-5 b/l discolored, thick, dystrophic with subungual debris and pain with palpation to nailbeds due to thickness of nails.  Musculoskeletal: Muscle strength 5/5 to all LE muscle groups b/l.  No gross bony deformities b/l.  No pain, crepitus or joint limitation noted with ROM.   Neurological Examination: Protective sensation intact 5/5 b/l with 10 gram monofilament bilaterally.  Assessment: Painful onychomycosis toenails 1-5 b/l  NIDDM Pt on long term blood thinner, Eliquis  Plan: 1. Toenails 1-5 b/l were debrided in length and girth without iatrogenic bleeding. 2. Patient to continue soft, supportive shoe gear 3. Patient to report any pedal injuries to medical professional immediately. 4. Follow up 3 months.  5. Patient/POA to call should there be a concern in the interim.

## 2019-07-05 ENCOUNTER — Other Ambulatory Visit: Payer: Self-pay | Admitting: Family Medicine

## 2019-07-25 ENCOUNTER — Other Ambulatory Visit: Payer: Self-pay | Admitting: Family Medicine

## 2019-07-25 DIAGNOSIS — G8929 Other chronic pain: Secondary | ICD-10-CM

## 2019-07-25 DIAGNOSIS — M545 Low back pain, unspecified: Secondary | ICD-10-CM

## 2019-08-10 ENCOUNTER — Telehealth: Payer: Self-pay

## 2019-08-10 MED ORDER — CEPHALEXIN 250 MG PO CAPS: 250.0000 mg | ORAL_CAPSULE | Freq: Three times a day (TID) | ORAL | 0 refills | Status: DC

## 2019-08-10 NOTE — Telephone Encounter (Signed)
Patient with frequency and change in urine color like prior UTIs.  No fever or back pain or nausea and vomiting  Does have diabetes  Given pending holiday and covid crisis will treat for UTI  Start keflex 250 three times a day for 7 days Asked to be seen if worsening or develops fever or nausea and vomiting  She agrees

## 2019-08-10 NOTE — Telephone Encounter (Signed)
Pt calls nurse line with questions about UTI symptom management. Patient asking about the use of azo with the blood thinner she is currently being prescribed. No virtual visits open this afternoon.   To preceptor  Talbot Grumbling, RN

## 2019-08-16 ENCOUNTER — Ambulatory Visit: Payer: Medicare Other | Admitting: Family Medicine

## 2019-08-30 ENCOUNTER — Emergency Department (HOSPITAL_COMMUNITY)
Admission: EM | Admit: 2019-08-30 | Discharge: 2019-08-30 | Disposition: A | Discharge status: Home / Self Care | Payer: Medicare Other | Attending: Emergency Medicine | Admitting: Emergency Medicine

## 2019-08-30 ENCOUNTER — Other Ambulatory Visit: Payer: Self-pay

## 2019-08-30 ENCOUNTER — Emergency Department (HOSPITAL_BASED_OUTPATIENT_CLINIC_OR_DEPARTMENT_OTHER): Payer: Medicare Other

## 2019-08-30 ENCOUNTER — Encounter (HOSPITAL_COMMUNITY): Payer: Medicare Other

## 2019-08-30 ENCOUNTER — Emergency Department (HOSPITAL_COMMUNITY): Payer: Medicare Other

## 2019-08-30 ENCOUNTER — Encounter (HOSPITAL_COMMUNITY): Payer: Self-pay | Admitting: Emergency Medicine

## 2019-08-30 DIAGNOSIS — M7989 Other specified soft tissue disorders: Secondary | ICD-10-CM | POA: Diagnosis not present

## 2019-08-30 DIAGNOSIS — R42 Dizziness and giddiness: Secondary | ICD-10-CM | POA: Insufficient documentation

## 2019-08-30 DIAGNOSIS — Z20822 Contact with and (suspected) exposure to covid-19: Secondary | ICD-10-CM | POA: Diagnosis not present

## 2019-08-30 DIAGNOSIS — Z79899 Other long term (current) drug therapy: Secondary | ICD-10-CM | POA: Insufficient documentation

## 2019-08-30 DIAGNOSIS — N189 Chronic kidney disease, unspecified: Secondary | ICD-10-CM | POA: Insufficient documentation

## 2019-08-30 DIAGNOSIS — Z86711 Personal history of pulmonary embolism: Secondary | ICD-10-CM | POA: Insufficient documentation

## 2019-08-30 DIAGNOSIS — R0789 Other chest pain: Secondary | ICD-10-CM | POA: Insufficient documentation

## 2019-08-30 DIAGNOSIS — I4891 Unspecified atrial fibrillation: Secondary | ICD-10-CM | POA: Diagnosis not present

## 2019-08-30 DIAGNOSIS — M79651 Pain in right thigh: Secondary | ICD-10-CM | POA: Insufficient documentation

## 2019-08-30 DIAGNOSIS — R0602 Shortness of breath: Secondary | ICD-10-CM | POA: Diagnosis not present

## 2019-08-30 DIAGNOSIS — M79609 Pain in unspecified limb: Secondary | ICD-10-CM

## 2019-08-30 DIAGNOSIS — I129 Hypertensive chronic kidney disease with stage 1 through stage 4 chronic kidney disease, or unspecified chronic kidney disease: Secondary | ICD-10-CM | POA: Diagnosis not present

## 2019-08-30 DIAGNOSIS — Z7901 Long term (current) use of anticoagulants: Secondary | ICD-10-CM | POA: Insufficient documentation

## 2019-08-30 DIAGNOSIS — Z7984 Long term (current) use of oral hypoglycemic drugs: Secondary | ICD-10-CM | POA: Diagnosis not present

## 2019-08-30 DIAGNOSIS — E1122 Type 2 diabetes mellitus with diabetic chronic kidney disease: Secondary | ICD-10-CM | POA: Diagnosis not present

## 2019-08-30 DIAGNOSIS — R079 Chest pain, unspecified: Secondary | ICD-10-CM

## 2019-08-30 LAB — CBC WITH DIFFERENTIAL/PLATELET
Abs Immature Granulocytes: 0.01 10*3/uL (ref 0.00–0.07)
Basophils Absolute: 0 10*3/uL (ref 0.0–0.1)
Basophils Relative: 0 %
Eosinophils Absolute: 0.2 10*3/uL (ref 0.0–0.5)
Eosinophils Relative: 5 %
HCT: 35.5 % — ABNORMAL LOW (ref 36.0–46.0)
Hemoglobin: 11.4 g/dL — ABNORMAL LOW (ref 12.0–15.0)
Immature Granulocytes: 0 %
Lymphocytes Relative: 36 %
Lymphs Abs: 1.4 10*3/uL (ref 0.7–4.0)
MCH: 29.2 pg (ref 26.0–34.0)
MCHC: 32.1 g/dL (ref 30.0–36.0)
MCV: 91 fL (ref 80.0–100.0)
Monocytes Absolute: 0.5 10*3/uL (ref 0.1–1.0)
Monocytes Relative: 13 %
Neutro Abs: 1.8 10*3/uL (ref 1.7–7.7)
Neutrophils Relative %: 46 %
Platelets: 192 10*3/uL (ref 150–400)
RBC: 3.9 MIL/uL (ref 3.87–5.11)
RDW: 15.9 % — ABNORMAL HIGH (ref 11.5–15.5)
WBC: 3.9 10*3/uL — ABNORMAL LOW (ref 4.0–10.5)
nRBC: 0 % (ref 0.0–0.2)

## 2019-08-30 LAB — POC SARS CORONAVIRUS 2 AG -  ED: SARS Coronavirus 2 Ag: NEGATIVE

## 2019-08-30 LAB — BASIC METABOLIC PANEL
Anion gap: 8 (ref 5–15)
BUN: 13 mg/dL (ref 8–23)
CO2: 24 mmol/L (ref 22–32)
Calcium: 9.2 mg/dL (ref 8.9–10.3)
Chloride: 105 mmol/L (ref 98–111)
Creatinine, Ser: 1.29 mg/dL — ABNORMAL HIGH (ref 0.44–1.00)
GFR calc Af Amer: 48 mL/min — ABNORMAL LOW (ref >60)
GFR calc non Af Amer: 42 mL/min — ABNORMAL LOW (ref >60)
Glucose, Bld: 178 mg/dL — ABNORMAL HIGH (ref 70–99)
Potassium: 4.3 mmol/L (ref 3.5–5.1)
Sodium: 137 mmol/L (ref 135–145)

## 2019-08-30 LAB — TROPONIN I (HIGH SENSITIVITY): Troponin I (High Sensitivity): 5 ng/L (ref <18)

## 2019-08-30 MED ORDER — METOPROLOL TARTRATE 25 MG PO TABS
25.0000 mg | ORAL_TABLET | Freq: Once | ORAL | Status: AC
  Administered 2019-08-30: 25 mg via ORAL
  Filled 2019-08-30: qty 1

## 2019-08-30 NOTE — ED Triage Notes (Signed)
Pt took eliquis this AM and shortly after reports feeling "swimmy headed" with SOB and chest pressure. Denies pain

## 2019-08-30 NOTE — ED Provider Notes (Signed)
[facility] EMERGENCY DEPARTMENT Provider Note   CSN: [id number] Arrival date & time: 08/30/19  0735     History Chief Complaint  Patient presents with  . Shortness of Breath    Erin Coffey is a 74 y.o. female with a history of paroxysmal A. fib, pulmonary embolism, on Eliquis, chronic kidney disease, diabetes, presented to the emergency department with chest pressure and lightheadedness.  Patient reports that she was in her usual state of health the past several days.  She woke up today feeling normal.  Early this morning around 630 or 7 while she was trying to sit down in a chair, she reports that she suddenly the sensation of chest pressure as well as lightheadedness and fogginess.  Symptoms lasted intensely several minutes, and subsequently gone away.  She is concerned because she feels like she may have had similar symptoms in the past with a pulmonary embolisms.  However she also reports that she has had similar symptoms in the past when she has episodes of atrial fibrillation.  She is unsure if she may have had an episode of A. fib today.  Currently in the emergency department she denies shortness of breath, chest pressure She reports a mild sensation of fogginess.  She denies any vision changes.  She denies nausea vomiting or diarrhea.  She denies any cough or congestion or recent sick contacts.  She has been compliant with her eliquis.   HPI     Past Medical History:  Diagnosis Date  . Allergy   . Anemia, iron deficiency   . ANXIETY, SITUATIONAL 04/06/2009   Qualifier: Diagnosis of  By: [name]  MD, Colletta [place]    . Arthritis   . BACK PAIN W/RADIATION, UNSPECIFIED 10/08/2006   Qualifier: Diagnosis of  By: [name]    . Back pain, thoracic 12/20/2012  . Carpal tunnel syndrome 12/07/2014  . Cholelithiasis 06/25/2012  . Chronic kidney disease   . Diabetes mellitus   . DM type 2 goal A1C below 7.5 06/05/2008   Qualifier: Diagnosis of  By: [name]  MD,  Colletta [place]    . GASTROESOPHAGEAL REFLUX, NO ESOPHAGITIS 10/08/2006   Qualifier: Diagnosis of  By: [name]    . GERD (gastroesophageal reflux disease)   . Goiter, unspecified 10/21/2007   Qualifier: Diagnosis of  By: [name] MD, [name]    . History of DVT (deep vein thrombosis) 05/05/2013  . Hx of pulmonary embolus 05/08/2011  . HYPERLIPIDEMIA 08/08/2008   Qualifier: Diagnosis of  By: [name]  MD, Colletta [place]    . Hypertension   . HYPERTENSION, BENIGN ESSENTIAL 11/27/2006   Qualifier: Diagnosis of  By: [name] MD, [place]    . Left tennis elbow 11/02/2014  . Long term (current) use of anticoagulants 05/08/2011  . Palpitations 10/01/2012  . Personal history of colonic polyps 10/24/2009   Qualifier: Diagnosis of  By: [name] MD, Sandy Salaam   . Pica 08/08/2009   Qualifier: Diagnosis of  By: [name], [name]    . Polymyalgia rheumatica ([place]) 10/02/2009   Qualifier: Diagnosis of  By: [name] MD, Apolonio Schneiders    . Pulmonary embolism ([place]) 04/28/11  . RENAL FAILURE 10/18/2009   Qualifier: History of  By: [name]  MD, Colletta [place]    . Thyroid disease   . Trigger ring finger of left hand 11/02/2014  . UNSPECIFIED IRON DEFICIENCY ANEMIA 10/02/2009   Qualifier: Diagnosis of  By: [name] MD, Apolonio Schneiders      Patient Active Problem List   Diagnosis Date Noted  .  Elevated transaminase level 03/24/2019  . History of spinal fracture 03/24/2019  . Age-related osteoporosis with current pathological fracture 03/24/2019  . Nontraumatic complete tear of left rotator cuff 03/24/2019  . Chest pain 03/24/2019  . Leg pain 03/10/2019  . Primary hypercoagulable state ([place]) 03/07/2019  . Osteoporotic compression fracture of spine ([place]) 03/07/2019  . Elevated serum protein level 03/07/2019  . Pulmonary emboli ([place]) 03/03/2019  . Chronic left hip pain 08/17/2018  . Leg swelling 07/22/2018  . Left sided numbness 07/22/2018  . Onychogryphosis 05/31/2018  . Tinea pedis of right foot 05/31/2018  . Low back pain potentially associated with  radiculopathy 01/29/2018  . Diverticulosis of colon 03/24/2017  . Hypertensive retinopathy of right eye 01/20/2017  . Hypermetropia of both eyes 01/20/2017  . Back pain 01/05/2017  . Chronic kidney disease 04/29/2016  . Normocytic anemia 04/29/2016  . Dark stools 10/19/2015  . Rotator cuff dysfunction 06/25/2015  . Long term (current) use of anticoagulants 05/08/2011  . HYPERLIPIDEMIA 08/08/2008  . Type 2 diabetes mellitus with hemoglobin A1c goal of less than 7.5% ([place]) 06/05/2008  . HYPERTENSION, BENIGN ESSENTIAL 11/27/2006  . GASTROESOPHAGEAL REFLUX, NO ESOPHAGITIS 10/08/2006  . Osteoarthritis, multiple sites 10/08/2006    Past Surgical History:  Procedure Laterality Date  . ABDOMINAL HYSTERECTOMY    . CARDIAC CATHETERIZATION  04/29/11   Clean cardiac cath, no blockages.   . CARPAL TUNNEL RELEASE Right 1996  . KNEE ARTHROSCOPY  Bilateral  . TUBAL LIGATION       OB History   No obstetric history on file.     Family History  Problem Relation Age of Onset  . Diabetes Mother   . Heart disease Mother   . Hypertension Mother   . Heart disease Father   . Hypertension Father   . Diabetes Sister   . Heart disease Sister   . Hypertension Brother   . Cancer Sister 24       lukemia  . Cirrhosis Sister        alcohol  . Kidney disease Sister   . Diabetes Sister   . Diabetes Brother   . Colon cancer Neg Hx     Social History   Tobacco Use  . Smoking status: Never Smoker  . Smokeless tobacco: Never Used  Substance Use Topics  . Alcohol use: No    Alcohol/week: 0.0 standard drinks  . Drug use: No    Home Medications Prior to Admission medications   Medication Sig Start Date End Date Taking? Authorizing Provider  traMADol (ULTRAM) 50 MG tablet TAKE 1 TABLET BY MOUTH EVERY 6 HOURS AS NEEDED FOR MODERATE PAIN 07/26/19   Bonnita Hollow, MD  acetaminophen (TYLENOL) 650 MG CR tablet Take 1,300 mg by mouth every 8 (eight) hours as needed for pain.    [provider]  alendronate (FOSAMAX) 70 MG tablet Take 1 tablet (70 mg total) by mouth every 7 (seven) days. Take with a full glass of water on an empty stomach. 03/24/19   Bonnita Hollow, MD  amLODipine (NORVASC) 5 MG tablet Take 1 tablet (5 mg total) by mouth at bedtime. 04/04/19   Bonnita Hollow, MD  apixaban (ELIQUIS) 5 MG TABS tablet Take 1 tablet (5 mg total) by mouth 2 (two) times daily. Begin after completing starterpack for eliquis 03/24/19   Martyn Malay, MD  atorvastatin (LIPITOR) 40 MG tablet Take 1 tablet (40 mg total) by mouth daily. Patient taking differently: Take 40 mg by mouth every  Tuesday, Thursday, and Saturday at 6 PM.  10/20/18   Bonnita Hollow, MD  baclofen (LIORESAL) 10 MG tablet Take 1 tablet (10 mg total) by mouth daily as needed for muscle spasms. 03/07/19   Martyn Malay, MD  Blood Glucose Monitoring Suppl (ONETOUCH VERIO) w/Device KIT Use as instructed to test sugars once daily.  Dx Code: E11.9 02/10/19   Martyn Malay, MD  calcium-vitamin D (OSCAL-500) 500-400 MG-UNIT tablet Take 1 tablet by mouth 2 (two) times daily. Patient taking differently: Take 1 tablet by mouth daily.  05/28/16   Smiley Houseman, MD  cephALEXin (KEFLEX) 250 MG capsule Take 1 capsule (250 mg total) by mouth 3 (three) times daily. 08/10/19   Lind Covert, MD  diclofenac sodium (VOLTAREN) 1 % GEL Apply 2 g topically 4 (four) times daily. Patient taking differently: Apply 1 application topically at bedtime. Apply to shoulders 03/03/19   Bonnita Hollow, MD  Eliquis DVT/PE Starter Pack [facility] STARTER PACK) 5 MG TABS Take as directed on package: start with two-'5mg'$  tablets twice daily for 7 days. On day 8, switch to one-'5mg'$  tablet twice daily. Patient taking differently: Take 5-10 mg by mouth See admin instructions. Ordered 03/04/19:  Take two-'5mg'$  tablets twice daily for 7 days. On day 8, switch to one-'5mg'$  tablet twice daily. 03/04/19   Sherene Sires, DO  famotidine (PEPCID) 20 MG  tablet Take 1 tablet (20 mg total) by mouth 2 (two) times daily. 03/07/19   Martyn Malay, MD  fish oil-omega-3 fatty acids 1000 MG capsule Take 1 g by mouth daily.     [provider]  gabapentin (NEURONTIN) 100 MG capsule Take 1 capsule (100 mg total) by mouth 3 (three) times daily. 03/07/19   Martyn Malay, MD  glucose blood (ONETOUCH VERIO) test strip Use as instructed to test sugars once daily.  Dx Code: E11.9 02/07/19   Josephine Igo B, MD  glucose blood test strip Use as instructed 12/10/15   Smiley Houseman, MD  iron polysaccharides (FERREX 150) 150 MG capsule Take 1 capsule (150 mg total) by mouth daily. 03/03/19   Bonnita Hollow, MD  ketoconazole (NIZORAL) 2 % cream APPLY TO AFFECTED AREA TWICE A DAY Patient taking differently: Apply 1 application topically daily as needed (Rash).  03/03/19   Bonnita Hollow, MD  Lancet Devices (ONE TOUCH DELICA LANCING DEV) MISC Use as instructed to test sugars once daily.  Dx Code: E11.9 02/10/19   Martyn Malay, MD  losartan (COZAAR) 25 MG tablet Take 1 tablet by mouth once daily 07/05/19   Bonnita Hollow, MD  metoprolol tartrate (LOPRESSOR) 25 MG tablet Take 1 tablet by mouth twice daily 04/29/19   Bonnita Hollow, MD  OneTouch Delica Lancets 05L MISC Use as instructed to test sugars once daily.  Dx Code: E11.9 02/10/19   Martyn Malay, MD  polyethylene glycol powder (GLYCOLAX/MIRALAX) powder Take 17 g by mouth 2 (two) times daily as needed. Patient taking differently: Take 17 g by mouth 2 (two) times daily as needed (constipation).  08/17/18   Rory Percy, DO  triamcinolone cream (KENALOG) 0.1 % apply to affected area twice a day Patient taking differently: Apply 1 application topically daily as needed (itching).  08/17/18   Rory Percy, DO    Allergies    Lisinopril  Review of Systems   Review of Systems  Constitutional: Negative for chills and fever.  HENT: Negative for congestion and sore throat.  Eyes: Negative  for photophobia and visual disturbance.  Respiratory: Negative for cough and shortness of breath.   Cardiovascular: Positive for chest pain. Negative for palpitations and leg swelling.  Gastrointestinal: Negative for abdominal pain, nausea and vomiting.  Musculoskeletal: Negative for arthralgias and back pain.  Skin: Negative for pallor and rash.  Neurological: Positive for dizziness and light-headedness. Negative for seizures, syncope, facial asymmetry, speech difficulty and headaches.  All other systems reviewed and are negative.   Physical Exam Updated Vital Signs BP (!) 142/56   Pulse (!) 49   Temp 99.7 F (37.6 C) (Oral)   Resp 13   Ht '5\' 9"'$  (1.753 m)   Wt 133.8 kg   SpO2 100%   BMI 43.56 kg/m   Physical Exam Vitals and nursing note reviewed.  Constitutional:      General: She is not in acute distress.    Appearance: She is well-developed.  HENT:     Head: Normocephalic and atraumatic.  Eyes:     Conjunctiva/sclera: Conjunctivae normal.  Cardiovascular:     Rate and Rhythm: Normal rate and regular rhythm.  Pulmonary:     Effort: Pulmonary effort is normal. No respiratory distress.     Breath sounds: Normal breath sounds.     Comments: 100% O2 on room air Speaking comfortably in full sentences Abdominal:     Palpations: Abdomen is soft.     Tenderness: There is no abdominal tenderness.  Musculoskeletal:     Cervical back: Neck supple.     Right lower leg: No edema.     Left lower leg: No edema.  Skin:    General: Skin is warm and dry.  Neurological:     Mental Status: She is alert.  Psychiatric:        Mood and Affect: Mood normal.        Behavior: Behavior normal.     ED Results / Procedures / Treatments   Labs (all labs ordered are listed, but only abnormal results are displayed) Labs Reviewed  BASIC METABOLIC PANEL - Abnormal; Notable for the following components:      Result Value   Glucose, Bld 178 (*)    Creatinine, Ser 1.29 (*)    GFR calc  non Af Amer 42 (*)    GFR calc Af Amer 48 (*)    All other components within normal limits  CBC WITH DIFFERENTIAL/PLATELET - Abnormal; Notable for the following components:   WBC 3.9 (*)    Hemoglobin 11.4 (*)    HCT 35.5 (*)    RDW 15.9 (*)    All other components within normal limits  POC SARS CORONAVIRUS 2 AG -  ED  TROPONIN I (HIGH SENSITIVITY)    EKG EKG Interpretation  Date/Time:  Tuesday August 30 2019 07:41:58 EST Ventricular Rate:  84 PR Interval:    QRS Duration: 96 QT Interval:  400 QTC Calculation: 473 R Axis:   32 Text Interpretation: Sinus rhythm No STEMI Confirmed by Octaviano Glow [phone]) on 08/30/2019 7:52:09 AM   Radiology DG Chest Port 1 View  Result Date: 08/30/2019 CLINICAL DATA:  Shortness of breath and chest pressure EXAM: PORTABLE CHEST 1 VIEW COMPARISON:  03/03/2019 FINDINGS: No new consolidation or edema. No pleural effusion or pneumothorax. Stable cardiomediastinal contours with top-normal heart size. IMPRESSION: No acute process in the chest. Electronically Signed   By: Macy Mis M.D.   On: 08/30/2019 08:54   VAS [name] LOWER EXTREMITY VENOUS (DVT) (ONLY [facility] & [facility])  Result  Date: 08/30/2019  Lower Venous Study Indications: Pain, and Swelling.  Risk Factors: HTN, DM. Anticoagulation: Eliquis. Limitations: Body habitus. Comparison Study: Prior lower extremity venous 03-10-19, negative. Performing Technologist: Baldwin Crown ARDMS, RVT  Examination Guidelines: A complete evaluation includes B-mode imaging, spectral Doppler, color Doppler, and power Doppler as needed of all accessible portions of each vessel. Bilateral testing is considered an integral part of a complete examination. Limited examinations for reoccurring indications may be performed as noted.  +---------+---------------+---------+-----------+----------+--------------+ RIGHT    CompressibilityPhasicitySpontaneityPropertiesThrombus Aging  +---------+---------------+---------+-----------+----------+--------------+ CFV      Full           Yes      Yes                                 +---------+---------------+---------+-----------+----------+--------------+ SFJ      Full                                                        +---------+---------------+---------+-----------+----------+--------------+ FV Prox  Full                                                        +---------+---------------+---------+-----------+----------+--------------+ FV Mid   Full                                                        +---------+---------------+---------+-----------+----------+--------------+ FV DistalFull                                                        +---------+---------------+---------+-----------+----------+--------------+ PFV      Full                                                        +---------+---------------+---------+-----------+----------+--------------+ POP      Full           Yes      Yes                                 +---------+---------------+---------+-----------+----------+--------------+ PTV      Full                                                        +---------+---------------+---------+-----------+----------+--------------+ PERO     Full                                                        +---------+---------------+---------+-----------+----------+--------------+  Poorly visualized FV distal due to patient body habitus.  +----+---------------+---------+-----------+----------+--------------+ LEFTCompressibilityPhasicitySpontaneityPropertiesThrombus Aging +----+---------------+---------+-----------+----------+--------------+ CFV Full           Yes      Yes                                 +----+---------------+---------+-----------+----------+--------------+     Summary: Right: There is no evidence of deep vein thrombosis in the lower extremity. No  cystic structure found in the popliteal fossa. Left: No evidence of common femoral vein obstruction.  *See table(s) above for measurements and observations. Electronically signed by Deitra Mayo MD on 08/30/2019 at 12:49:58 PM.    Final     Procedures Procedures (including critical care time)  Medications Ordered in ED Medications  metoprolol tartrate (LOPRESSOR) tablet 25 mg (25 mg Oral Given 08/30/19 0948)    ED Course  I have reviewed the triage vital signs and the nursing notes.  Pertinent labs & imaging results that were available during my care of the patient were reviewed by me and considered in my medical decision making (see chart for details).  Is a 74 year old female with a history of PE paroxysmal A. fib presenting to emergency department with an episode of chest pressure and lightheadedness that began spontaneously this morning.  She was not exerting herself at that time.  She is concerned about a recurrent PE.  She reports she has had 3 pulmonary embolisms in the past, but none since being switched from Coumadin to Eliquis last year.    On exam the patient appears extremely comfortable.  She has no hypoxia, no tachycardia, no tachypnea.  I have a very low suspicion for large obstructing PE.  She has no signs or symptoms of DVT on exam.  I discussed with the patient the differential, which includes atrial fibrillation versus pulmonary embolism versus infection.  She agreed to a rapid Covid test.  We will also get a chest x-ray, as well as troponin and electrolyte levels.  We will check her hemoglobin.  After engaging in shared decision making, we have agreed to wait and review her lab work prior to considering a CT scan.   Clinical Course as of Aug 29 1840  Tue Aug 30, 2019  2536 I reviewed the patient's lab work with her.  We discussed her kidney function.  After engaging in shared decision making, we decided not to pursue another CT scan at this time.  I do not believe  it would change her management, even if a small subsegmental PE was found, as she is already on full A/C.  I will however obtain an ultrasound of the right lower extremity given her reported pain in her right proximal thigh, to evaluate for the possibility of DVT.   [MT]  1106 Summary: Right: There is no evidence of deep vein thrombosis in the lower extremity. No cystic structure found in the popliteal fossa. Left: No evidence of common femoral vein obstruction.   [MT]    Clinical Course User Index [MT] Charidy Cappelletti, Carola Rhine, MD   Final Clinical Impression(s) / ED Diagnoses Final diagnoses:  Chest pain, unspecified type    Rx / DC Orders ED Discharge Orders    None       Wyvonnia Dusky, MD 08/30/19 1842

## 2019-08-30 NOTE — Progress Notes (Signed)
Right lower extremity venous duplex completed.  Preliminary results can be found under CV proc under chart review.  08/30/2019 10:51 AM  Lowell Mcgurk, K., RDMS, RVT

## 2019-08-30 NOTE — Discharge Instructions (Addendum)
Your chest pain may have been due to an episode of Atrial fibrillation, or A. Fib.  Please follow up with your primary care doctor.  I've included a phone number for our cardiology clinic, who can manage this issue for you.

## 2019-08-30 NOTE — ED Notes (Signed)
Hooked patient back up to the monitor patient is resting with call bell in reach 

## 2019-09-01 ENCOUNTER — Other Ambulatory Visit: Payer: Self-pay

## 2019-09-01 ENCOUNTER — Emergency Department (HOSPITAL_COMMUNITY)
Admission: EM | Admit: 2019-09-01 | Discharge: 2019-09-02 | Disposition: A | Discharge status: Home / Self Care | Payer: Medicare Other | Attending: Emergency Medicine | Admitting: Emergency Medicine

## 2019-09-01 ENCOUNTER — Encounter (HOSPITAL_COMMUNITY): Payer: Self-pay | Admitting: Emergency Medicine

## 2019-09-01 ENCOUNTER — Emergency Department (HOSPITAL_COMMUNITY): Payer: Medicare Other

## 2019-09-01 DIAGNOSIS — R0789 Other chest pain: Secondary | ICD-10-CM

## 2019-09-01 DIAGNOSIS — R079 Chest pain, unspecified: Secondary | ICD-10-CM | POA: Diagnosis not present

## 2019-09-01 DIAGNOSIS — R002 Palpitations: Secondary | ICD-10-CM | POA: Diagnosis not present

## 2019-09-01 LAB — CBC
HCT: 36.9 % (ref 36.0–46.0)
Hemoglobin: 11.8 g/dL — ABNORMAL LOW (ref 12.0–15.0)
MCH: 29.1 pg (ref 26.0–34.0)
MCHC: 32 g/dL (ref 30.0–36.0)
MCV: 90.9 fL (ref 80.0–100.0)
Platelets: 216 10*3/uL (ref 150–400)
RBC: 4.06 MIL/uL (ref 3.87–5.11)
RDW: 16.1 % — ABNORMAL HIGH (ref 11.5–15.5)
WBC: 5.1 10*3/uL (ref 4.0–10.5)
nRBC: 0 % (ref 0.0–0.2)

## 2019-09-01 LAB — BASIC METABOLIC PANEL
Anion gap: 11 (ref 5–15)
BUN: 18 mg/dL (ref 8–23)
CO2: 22 mmol/L (ref 22–32)
Calcium: 9.4 mg/dL (ref 8.9–10.3)
Chloride: 103 mmol/L (ref 98–111)
Creatinine, Ser: 1.44 mg/dL — ABNORMAL HIGH (ref 0.44–1.00)
GFR calc Af Amer: 42 mL/min — ABNORMAL LOW (ref >60)
GFR calc non Af Amer: 36 mL/min — ABNORMAL LOW (ref >60)
Glucose, Bld: 113 mg/dL — ABNORMAL HIGH (ref 70–99)
Potassium: 4.3 mmol/L (ref 3.5–5.1)
Sodium: 136 mmol/L (ref 135–145)

## 2019-09-01 LAB — TROPONIN I (HIGH SENSITIVITY)
Troponin I (High Sensitivity): 8 ng/L (ref <18)
Troponin I (High Sensitivity): 8 ng/L (ref <18)

## 2019-09-01 NOTE — ED Triage Notes (Signed)
Pt reports chest pressure, SOB and dizziness. Has appt with cardiologist tomorrow. Denies any back, vomiting. Intermittent nausea.

## 2019-09-01 NOTE — Discharge Instructions (Addendum)
Continue medications as previously prescribed.  Keep your appointment with your cardiologist tomorrow as previously scheduled.

## 2019-09-01 NOTE — ED Provider Notes (Signed)
[facility] EMERGENCY DEPARTMENT Provider Note   CSN: [id number] Arrival date & time: 09/01/19  1654     History Chief Complaint  Patient presents with  . Chest Pain    Erin Coffey is a 74 y.o. female.  Patient is a 74 year old female with history of prior pulmonary emboli on Eliquis, diabetes, paroxysmal A. fib.  She presents today for evaluation of palpitations and chest discomfort.  This began earlier this afternoon.  She was seen for similar complaints 2 days ago and had a negative work-up.  Patient describes occasional palpitations and fluttering in her chest along with tightness.  She denies any nausea, shortness of breath, diaphoresis, or radiation to the arm or jaw.  Patient has no prior cardiac history with the exception of A. fib.  The history is provided by the patient.  Chest Pain Pain location:  Substernal area Pain quality: no tightness   Pain radiates to:  Does not radiate Pain severity:  Mild Onset quality:  Sudden Duration:  8 hours Timing:  Intermittent      Past Medical History:  Diagnosis Date  . Allergy   . Anemia, iron deficiency   . ANXIETY, SITUATIONAL 04/06/2009   Qualifier: Diagnosis of  By: [name]  MD, Colletta [place]    . Arthritis   . BACK PAIN W/RADIATION, UNSPECIFIED 10/08/2006   Qualifier: Diagnosis of  By: [name]    . Back pain, thoracic 12/20/2012  . Carpal tunnel syndrome 12/07/2014  . Cholelithiasis 06/25/2012  . Chronic kidney disease   . Diabetes mellitus   . DM type 2 goal A1C below 7.5 06/05/2008   Qualifier: Diagnosis of  By: [name]  MD, Colletta [place]    . GASTROESOPHAGEAL REFLUX, NO ESOPHAGITIS 10/08/2006   Qualifier: Diagnosis of  By: [name]    . GERD (gastroesophageal reflux disease)   . Goiter, unspecified 10/21/2007   Qualifier: Diagnosis of  By: [name] MD, [name]    . History of DVT (deep vein thrombosis) 05/05/2013  . Hx of pulmonary embolus 05/08/2011  . HYPERLIPIDEMIA 08/08/2008   Qualifier:  Diagnosis of  By: [name]  MD, Colletta [place]    . Hypertension   . HYPERTENSION, BENIGN ESSENTIAL 11/27/2006   Qualifier: Diagnosis of  By: [name] MD, [place]    . Left tennis elbow 11/02/2014  . Long term (current) use of anticoagulants 05/08/2011  . Palpitations 10/01/2012  . Personal history of colonic polyps 10/24/2009   Qualifier: Diagnosis of  By: [name] MD, Sandy Salaam   . Pica 08/08/2009   Qualifier: Diagnosis of  By: [name], [name]    . Polymyalgia rheumatica ([place]) 10/02/2009   Qualifier: Diagnosis of  By: [name] MD, Apolonio Schneiders    . Pulmonary embolism ([place]) 04/28/11  . RENAL FAILURE 10/18/2009   Qualifier: History of  By: [name]  MD, Colletta [place]    . Thyroid disease   . Trigger ring finger of left hand 11/02/2014  . UNSPECIFIED IRON DEFICIENCY ANEMIA 10/02/2009   Qualifier: Diagnosis of  By: [name] MD, Apolonio Schneiders      Patient Active Problem List   Diagnosis Date Noted  . Elevated transaminase level 03/24/2019  . History of spinal fracture 03/24/2019  . Age-related osteoporosis with current pathological fracture 03/24/2019  . Nontraumatic complete tear of left rotator cuff 03/24/2019  . Chest pain 03/24/2019  . Leg pain 03/10/2019  . Primary hypercoagulable state ([place]) 03/07/2019  . Osteoporotic compression fracture of spine ([place]) 03/07/2019  . Elevated serum protein level 03/07/2019  . Pulmonary emboli (  [place]) 03/03/2019  . Chronic left hip pain 08/17/2018  . Leg swelling 07/22/2018  . Left sided numbness 07/22/2018  . Onychogryphosis 05/31/2018  . Tinea pedis of right foot 05/31/2018  . Low back pain potentially associated with radiculopathy 01/29/2018  . Diverticulosis of colon 03/24/2017  . Hypertensive retinopathy of right eye 01/20/2017  . Hypermetropia of both eyes 01/20/2017  . Back pain 01/05/2017  . Chronic kidney disease 04/29/2016  . Normocytic anemia 04/29/2016  . Dark stools 10/19/2015  . Rotator cuff dysfunction 06/25/2015  . Long term (current) use of anticoagulants 05/08/2011   . HYPERLIPIDEMIA 08/08/2008  . Type 2 diabetes mellitus with hemoglobin A1c goal of less than 7.5% ([place]) 06/05/2008  . HYPERTENSION, BENIGN ESSENTIAL 11/27/2006  . GASTROESOPHAGEAL REFLUX, NO ESOPHAGITIS 10/08/2006  . Osteoarthritis, multiple sites 10/08/2006    Past Surgical History:  Procedure Laterality Date  . ABDOMINAL HYSTERECTOMY    . CARDIAC CATHETERIZATION  04/29/11   Clean cardiac cath, no blockages.   . CARPAL TUNNEL RELEASE Right 1996  . KNEE ARTHROSCOPY  Bilateral  . TUBAL LIGATION       OB History   No obstetric history on file.     Family History  Problem Relation Age of Onset  . Diabetes Mother   . Heart disease Mother   . Hypertension Mother   . Heart disease Father   . Hypertension Father   . Diabetes Sister   . Heart disease Sister   . Hypertension Brother   . Cancer Sister 11       lukemia  . Cirrhosis Sister        alcohol  . Kidney disease Sister   . Diabetes Sister   . Diabetes Brother   . Colon cancer Neg Hx     Social History   Tobacco Use  . Smoking status: Never Smoker  . Smokeless tobacco: Never Used  Substance Use Topics  . Alcohol use: No    Alcohol/week: 0.0 standard drinks  . Drug use: No    Home Medications Prior to Admission medications   Medication Sig Start Date End Date Taking? Authorizing Provider  traMADol (ULTRAM) 50 MG tablet TAKE 1 TABLET BY MOUTH EVERY 6 HOURS AS NEEDED FOR MODERATE PAIN 07/26/19   Bonnita Hollow, MD  acetaminophen (TYLENOL) 650 MG CR tablet Take 1,300 mg by mouth every 8 (eight) hours as needed for pain.    [provider]  alendronate (FOSAMAX) 70 MG tablet Take 1 tablet (70 mg total) by mouth every 7 (seven) days. Take with a full glass of water on an empty stomach. 03/24/19   Bonnita Hollow, MD  amLODipine (NORVASC) 5 MG tablet Take 1 tablet (5 mg total) by mouth at bedtime. 04/04/19   Bonnita Hollow, MD  apixaban (ELIQUIS) 5 MG TABS tablet Take 1 tablet (5 mg total) by mouth  2 (two) times daily. Begin after completing starterpack for eliquis 03/24/19   Martyn Malay, MD  atorvastatin (LIPITOR) 40 MG tablet Take 1 tablet (40 mg total) by mouth daily. Patient taking differently: Take 40 mg by mouth every Tuesday, Thursday, and Saturday at 6 PM.  10/20/18   Bonnita Hollow, MD  baclofen (LIORESAL) 10 MG tablet Take 1 tablet (10 mg total) by mouth daily as needed for muscle spasms. 03/07/19   Martyn Malay, MD  Blood Glucose Monitoring Suppl (ONETOUCH VERIO) w/Device KIT Use as instructed to test sugars once daily.  Dx Code: E11.9 02/10/19   Owens Shark,  Raiford Simmonds, MD  calcium-vitamin D (OSCAL-500) 500-400 MG-UNIT tablet Take 1 tablet by mouth 2 (two) times daily. Patient taking differently: Take 1 tablet by mouth daily.  05/28/16   Smiley Houseman, MD  cephALEXin (KEFLEX) 250 MG capsule Take 1 capsule (250 mg total) by mouth 3 (three) times daily. 08/10/19   Lind Covert, MD  diclofenac sodium (VOLTAREN) 1 % GEL Apply 2 g topically 4 (four) times daily. Patient taking differently: Apply 1 application topically at bedtime. Apply to shoulders 03/03/19   Bonnita Hollow, MD  Eliquis DVT/PE Starter Pack [facility] STARTER PACK) 5 MG TABS Take as directed on package: start with two-'5mg'$  tablets twice daily for 7 days. On day 8, switch to one-'5mg'$  tablet twice daily. Patient taking differently: Take 5-10 mg by mouth See admin instructions. Ordered 03/04/19:  Take two-'5mg'$  tablets twice daily for 7 days. On day 8, switch to one-'5mg'$  tablet twice daily. 03/04/19   Sherene Sires, DO  famotidine (PEPCID) 20 MG tablet Take 1 tablet (20 mg total) by mouth 2 (two) times daily. 03/07/19   Martyn Malay, MD  fish oil-omega-3 fatty acids 1000 MG capsule Take 1 g by mouth daily.     [provider]  gabapentin (NEURONTIN) 100 MG capsule Take 1 capsule (100 mg total) by mouth 3 (three) times daily. 03/07/19   Martyn Malay, MD  glucose blood (ONETOUCH VERIO) test strip Use as  instructed to test sugars once daily.  Dx Code: E11.9 02/07/19   Josephine Igo B, MD  glucose blood test strip Use as instructed 12/10/15   Smiley Houseman, MD  iron polysaccharides (FERREX 150) 150 MG capsule Take 1 capsule (150 mg total) by mouth daily. 03/03/19   Bonnita Hollow, MD  ketoconazole (NIZORAL) 2 % cream APPLY TO AFFECTED AREA TWICE A DAY Patient taking differently: Apply 1 application topically daily as needed (Rash).  03/03/19   Bonnita Hollow, MD  Lancet Devices (ONE TOUCH DELICA LANCING DEV) MISC Use as instructed to test sugars once daily.  Dx Code: E11.9 02/10/19   Martyn Malay, MD  losartan (COZAAR) 25 MG tablet Take 1 tablet by mouth once daily 07/05/19   Bonnita Hollow, MD  metoprolol tartrate (LOPRESSOR) 25 MG tablet Take 1 tablet by mouth twice daily 04/29/19   Bonnita Hollow, MD  OneTouch Delica Lancets 95J MISC Use as instructed to test sugars once daily.  Dx Code: E11.9 02/10/19   Martyn Malay, MD  polyethylene glycol powder (GLYCOLAX/MIRALAX) powder Take 17 g by mouth 2 (two) times daily as needed. Patient taking differently: Take 17 g by mouth 2 (two) times daily as needed (constipation).  08/17/18   Rory Percy, DO  triamcinolone cream (KENALOG) 0.1 % apply to affected area twice a day Patient taking differently: Apply 1 application topically daily as needed (itching).  08/17/18   Rory Percy, DO    Allergies    Lisinopril  Review of Systems   Review of Systems  Cardiovascular: Positive for chest pain.  All other systems reviewed and are negative.   Physical Exam Updated Vital Signs BP (!) 171/91   Pulse 66   Temp 98.2 F (36.8 C) (Oral)   Resp 17   Ht '5\' 9"'$  (1.753 m)   Wt 133.8 kg   SpO2 99%   BMI 43.56 kg/m   Physical Exam Vitals and nursing note reviewed.  Constitutional:      General: She is not in acute distress.  Appearance: She is well-developed. She is not diaphoretic.  HENT:     Head: Normocephalic and atraumatic.   Cardiovascular:     Rate and Rhythm: Normal rate and regular rhythm.     Heart sounds: No murmur. No friction rub. No gallop.   Pulmonary:     Effort: Pulmonary effort is normal. No respiratory distress.     Breath sounds: Normal breath sounds. No wheezing.  Abdominal:     General: Bowel sounds are normal. There is no distension.     Palpations: Abdomen is soft.     Tenderness: There is no abdominal tenderness.  Musculoskeletal:        General: Normal range of motion.     Cervical back: Normal range of motion and neck supple.     Right lower leg: No tenderness. No edema.     Left lower leg: No tenderness. No edema.  Skin:    General: Skin is warm and dry.  Neurological:     Mental Status: She is alert and oriented to person, place, and time.     ED Results / Procedures / Treatments   Labs (all labs ordered are listed, but only abnormal results are displayed) Labs Reviewed  BASIC METABOLIC PANEL - Abnormal; Notable for the following components:      Result Value   Glucose, Bld 113 (*)    Creatinine, Ser 1.44 (*)    GFR calc non Af Amer 36 (*)    GFR calc Af Amer 42 (*)    All other components within normal limits  CBC - Abnormal; Notable for the following components:   Hemoglobin 11.8 (*)    RDW 16.1 (*)    All other components within normal limits  TROPONIN I (HIGH SENSITIVITY)  TROPONIN I (HIGH SENSITIVITY)    EKG EKG Interpretation  Date/Time:  Thursday September 01 2019 16:57:20 EST Ventricular Rate:  64 PR Interval:  176 QRS Duration: 82 QT Interval:  420 QTC Calculation: 433 R Axis:   23 Text Interpretation: Normal sinus rhythm Normal ECG Confirmed by Veryl Speak [phone]) on 09/01/2019 11:29:51 PM   Radiology DG Chest 2 View  Result Date: 09/01/2019 CLINICAL DATA:  Chest pain and dyspnea EXAM: CHEST - 2 VIEW COMPARISON:  08/30/2019 chest radiograph. FINDINGS: Stable cardiomediastinal silhouette with mild cardiomegaly. No pneumothorax. No pleural effusion.  Lungs appear clear, with no acute consolidative airspace disease and no overt pulmonary edema. IMPRESSION: Stable mild cardiomegaly without overt pulmonary edema. No active pulmonary disease. Electronically Signed   By: Ilona Sorrel M.D.   On: 09/01/2019 17:23    Procedures Procedures (including critical care time)  Medications Ordered in ED Medications - No data to display  ED Course  I have reviewed the triage vital signs and the nursing notes.  Pertinent labs & imaging results that were available during my care of the patient were reviewed by me and considered in my medical decision making (see chart for details).    MDM Rules/Calculators/A&P  Patient presents with complaints of palpitations and chest discomfort.  Her EKG is unchanged showing a normal sinus rhythm and troponin x2 was negative.  Patient has been observed on the monitor and I have witnessed no ectopy with the exception of an occasional PVC.  Patient has an appointment tomorrow with cardiology.  I feel as though she is stable for discharge with this follow-up.  Final Clinical Impression(s) / ED Diagnoses Final diagnoses:  None    Rx / DC Orders ED Discharge Orders  None       Veryl Speak, MD 09/01/19 [phone]

## 2019-09-02 ENCOUNTER — Ambulatory Visit: Payer: Medicare Other | Admitting: Cardiology

## 2019-09-02 ENCOUNTER — Encounter: Payer: Self-pay | Admitting: Cardiology

## 2019-09-02 VITALS — BP 177/77 | HR 66 | Ht 69.0 in | Wt 300.0 lb

## 2019-09-02 DIAGNOSIS — I10 Essential (primary) hypertension: Secondary | ICD-10-CM | POA: Diagnosis not present

## 2019-09-02 DIAGNOSIS — I208 Other forms of angina pectoris: Secondary | ICD-10-CM | POA: Diagnosis not present

## 2019-09-02 DIAGNOSIS — R0602 Shortness of breath: Secondary | ICD-10-CM | POA: Diagnosis not present

## 2019-09-02 DIAGNOSIS — R072 Precordial pain: Secondary | ICD-10-CM | POA: Diagnosis not present

## 2019-09-02 DIAGNOSIS — E785 Hyperlipidemia, unspecified: Secondary | ICD-10-CM

## 2019-09-02 DIAGNOSIS — E1169 Type 2 diabetes mellitus with other specified complication: Secondary | ICD-10-CM

## 2019-09-02 DIAGNOSIS — R002 Palpitations: Secondary | ICD-10-CM

## 2019-09-02 DIAGNOSIS — I2699 Other pulmonary embolism without acute cor pulmonale: Secondary | ICD-10-CM

## 2019-09-02 NOTE — Patient Instructions (Addendum)
Your cardiac CT will be scheduled at one of the below locations:   [facility] [address] [place], [place] [address] (336) [place] [address] [place], [place] [address] [phone]  If scheduled at [facility], please arrive at the [facility] main entrance of [facility] 30-45 minutes prior to test start time. Proceed to the [facility] Radiology Department (first floor) to check-in and test prep.  If scheduled at [facility], please arrive 15 mins early for check-in and test prep.  Please follow these instructions carefully (unless otherwise directed):   On the Night Before the Test: . Be sure to Drink plenty of water. . Do not consume any caffeinated/decaffeinated beverages or chocolate 12 hours prior to your test. . Do not take any antihistamines 12 hours prior to your test. . If the patient has contrast allergy: ? Patient will need a prescription for Prednisone and very clear instructions (as follows): 1. Prednisone 50 mg - take 13 hours prior to test 2. Take another Prednisone 50 mg 7 hours prior to test 3. Take another Prednisone 50 mg 1 hour prior to test 4. Take Benadryl 50 mg 1 hour prior to test . Patient must complete all four doses of above prophylactic medications. . Patient will need a ride after test due to Benadryl.  On the Day of the Test: . Drink plenty of water. Do not drink any water within one hour of the test. . Do not eat any food 4 hours prior to the test. . You may take your regular medications prior to the test.  . Take metoprolol (Lopressor) two hours prior to test. . HOLD Furosemide/Hydrochlorothiazide morning of the test. . FEMALES- please wear underwire-free bra if available       After the Test: . Drink plenty of water. . After receiving IV contrast, you may experience a mild flushed feeling. This is normal. . On  occasion, you may experience a mild rash up to 24 hours after the test. This is not dangerous. If this occurs, you can take Benadryl 25 mg and increase your fluid intake. . If you experience trouble breathing, this can be serious. If it is severe call 911 IMMEDIATELY. If it is mild, please call our office. . If you take any of these medications: Glipizide/Metformin, Avandament, Glucavance, please do not take 48 hours after completing test unless otherwise instructed.   Once we have confirmed authorization from your insurance company, we will call you to set up a date and time for your test.   For non-scheduling related questions, please contact the cardiac imaging nurse navigator should you have any questions/concerns: Marchia Bond, RN Navigator Cardiac Imaging [facility] [phone] Office    Medication Instructions:  Your physician recommends that you continue on your current medications as directed. Please refer to the Current Medication list given to you today.  *If you need a refill on your cardiac medications before your next appointment, please call your pharmacy*  Lab Work: Your physician recommends that you return for lab work within 1 week of scheduled CTA.  If you have labs (blood work) drawn today and your tests are completely normal, you will receive your results only by: [name] MyChart Message (if you have MyChart) OR . A paper copy in the mail If you have any lab test that is abnormal or we need to change your treatment, we will  call you to review the results.  Testing/Procedures: Your physician has recommended that you wear a 30 day event monitor. Event monitors are medical devices that record the heart's electrical activity. Doctors most often [name] these monitors to diagnose arrhythmias. Arrhythmias are problems with the speed or rhythm of the heartbeat. The monitor is a small, portable device. You can wear one while you do your normal daily  activities. This is usually used to diagnose what is causing palpitations/syncope (passing out).  Follow-Up: At [facility], you and your health needs are our priority.  As part of our continuing mission to provide you with exceptional heart care, we have created designated Provider Care Teams.  These Care Teams include your primary Cardiologist (physician) and Advanced Practice Providers (APPs -  Physician Assistants and Nurse Practitioners) who all work together to provide you with the care you need, when you need it.  Your next appointment:   6-8 week(s)  The format for your next appointment:   In Person  Provider:   Glenetta Hew, MD

## 2019-09-02 NOTE — Progress Notes (Signed)
Primary Care Provider: Bonnita Hollow, MD Cardiologist: No primary care provider on file. Electrophysiologist:   Clinic Note: Chief Complaint  Patient presents with  . New Patient (Initial Visit)    ER visit follow-up  . Chest Pain  . Palpitations     HPI:    Erin Coffey is a 74 y.o. female with a PMH notable for history of DVT and PE (not currently on Eliquis), DM-2 and CKD-3 who presents today for ER Visit.  Erin Coffey has been to the emergency room twice in the last week with episodes of chest discomfort, palpitations and dizziness.  Recent Hospitalizations:   August 30, 2019 [facility] ER visit-chest pressure and lightheadedness.  Went to try to sit down in a chair and suddenly felt chest pressure lightheadedness and fogginess.  Lasted several minutes and went away.  (Symptoms similar to prior PE, as well as prior A. Fib)-she was unsure if she had A. fib.  Indicated compliance with Eliquis.  Has not had recurrent PE since starting Eliquis. -->  Ruled out for Covid.  Low likelihood of PE because of full anticoagulation.  Right lower leg extremity was negative for DVT.  September 01, 2019-[place], ER: Again presented with palpitations and chest discomfort that began that afternoon.  Had noticed several episodes of palpitations and fluttering in her chest along with tightness since the 19th.  No radiation of the discomfort in her chest. ->  Only occasional PVCs noted on telemetry monitor.  Ruled out for MI.  Discharged home  Reviewed  CV studies:    The following studies were reviewed today: (if available, images/films reviewed: From Epic Chart or Care Everywhere) . Cardiac Cath 04/29/2011: LM - normal. LAD with 1 major Diag - normal, bifurcating Ramus - normal, Large (6-7 mm) Dominant LCx with 1 OM, 2 LPL branches & LPDA - non significant disease; small non-dominant RCA.   [name] Chest CTA-PE 03/18/2019: No evidence of PE.  Stable cardiomegaly.  Mild aortic  atherosclerosis.  (Compared to March 03, 2019 which showed mild right lower lobe PE without right heart strain -> however this initially study was probably not accurate)   Interval History:   Erin Coffey presents here today stating that since the accident the 18th, she has been having some dizzy spells and and lightheadedness, but on the Tuesday, January 19th she had the chest pressure irregular heartbeats associated with dizziness and swimmy headedness, difficulty breathing.  Really it happened without any provocation shortly after waking up.  It stayed with variable intensity until later on the afternoon.  She describes having irregular heartbeat sensation lasting over 1 hour however.  It spontaneously decreased but then the next day again after lunch she had more discomfort and had some more palpitations.  She described a chest fullness and took an extra dose of metoprolol.  Since this episode, she has noted some exertional dyspnea and off-and-on dizziness, lightheadedness and wooziness.  Some irregular heartbeats off and on.  She does indicate that she has recently been treated for UTI.  Since the episode on the 21st, she has been noticing more exertional dyspnea and intermittent discomfort and palpitations but not necessarily associated with any activity.  CV Review of Symptoms (Summary): positive for - chest pain, dyspnea on exertion, irregular heartbeat, palpitations, shortness of breath and Lightheadedness with near syncope negative for - loss of consciousness, orthopnea, paroxysmal nocturnal dyspnea or TIA/amaurosis fugax  The patient does not have symptoms concerning for COVID-19 infection (fever,  chills, cough, or new shortness of breath).  The patient is practicing social distancing & masking.   REVIEWED OF SYSTEMS   A comprehensive ROS was performed. Review of Systems  Constitutional: Positive for malaise/fatigue. Negative for weight loss (19 lb wgt gain).  HENT: Negative  for congestion and nosebleeds.   Respiratory: Positive for shortness of breath. Negative for cough, sputum production and wheezing.   Gastrointestinal: Negative for blood in stool and melena.  Genitourinary: Positive for dysuria (recent UTI Sx) and frequency. Negative for hematuria.  Musculoskeletal: Positive for back pain and joint pain. Negative for falls.  Neurological: Positive for dizziness and tingling. Negative for focal weakness and weakness.       Per HPI  Psychiatric/Behavioral: Negative for depression and memory loss. The patient is nervous/anxious and has insomnia.   All other systems reviewed and are negative.  I have reviewed and (if needed) personally updated the patient's problem list, medications, allergies, past medical and surgical history, social and family history.   PAST MEDICAL HISTORY   Past Medical History:  Diagnosis Date  . Allergy   . Anemia, iron deficiency   . ANXIETY, SITUATIONAL 04/06/2009   Qualifier: Diagnosis of  By: [name]  MD, Colletta [place]    . Arthritis   . Back pain, thoracic 12/20/2012  . Carpal tunnel syndrome 12/07/2014  . Cholelithiasis 06/25/2012  . Chronic kidney disease   . DM type 2 goal A1C below 7.5 06/05/2008   Qualifier: Diagnosis of  By: [name]  MD, Colletta Maryland    . GASTROESOPHAGEAL REFLUX, NO ESOPHAGITIS 10/08/2006   Qualifier: Diagnosis of  By: [name]    . Goiter, unspecified 10/21/2007   Qualifier: Diagnosis of  By: [name] MD, [name]    . History of DVT (deep vein thrombosis) 05/05/2013   on Eliquis  . HYPERLIPIDEMIA 08/08/2008   Qualifier: Diagnosis of  By: [name]  MD, Colletta Maryland    . HYPERTENSION, BENIGN ESSENTIAL 11/27/2006   Qualifier: Diagnosis of  By: [name] MD, [place]    . Personal history of colonic polyps 10/24/2009   Qualifier: Diagnosis of  By: [name] MD, Sandy Salaam   . Pica 08/08/2009   Qualifier: Diagnosis of  By: [name], [name]    . Polymyalgia rheumatica ([place]) 10/02/2009   Qualifier: Diagnosis of  By: [name] MD, Apolonio Schneiders    .  Pulmonary embolism ([place]) - On long Term anticoagulation (Z79.01) 04/28/2011   Converted from warfarin to Eliquis; on long-term anticoagulation.  . Thyroid disease   . Trigger ring finger of left hand 11/02/2014     PAST SURGICAL HISTORY   Past Surgical History:  Procedure Laterality Date  . ABDOMINAL HYSTERECTOMY    . CARDIAC CATHETERIZATION  04/29/11   (Dr. Lia Foyer [facility]): LM - normal. LAD with 1 major Diag - normal, bifurcating Ramus - normal, Large (6-7 mm) Dominant LCx with 1 OM, 2 LPL branches & LPDA - non significant disease; small non-dominant RCA.    [name] CARPAL TUNNEL RELEASE Right 1996  . KNEE ARTHROSCOPY  Bilateral  . TUBAL LIGATION       MEDICATIONS/ALLERGIES   Current Meds  Medication Sig  . acetaminophen (TYLENOL) 650 MG CR tablet Take 1,300 mg by mouth every 8 (eight) hours as needed for pain.  [name] alendronate (FOSAMAX) 70 MG tablet Take 1 tablet (70 mg total) by mouth every 7 (seven) days. Take with a full glass of water on an empty stomach.  [name] amLODipine (NORVASC) 5 MG tablet Take 1 tablet (5 mg total)  by mouth at bedtime.  [name] apixaban (ELIQUIS) 5 MG TABS tablet Take 1 tablet (5 mg total) by mouth 2 (two) times daily. Begin after completing starterpack for eliquis  . atorvastatin (LIPITOR) 40 MG tablet Take 1 tablet (40 mg total) by mouth daily. (Patient taking differently: Take 40 mg by mouth every Tuesday, Thursday, and Saturday at 6 PM. )  . baclofen (LIORESAL) 10 MG tablet Take 1 tablet (10 mg total) by mouth daily as needed for muscle spasms.  . Blood Glucose Monitoring Suppl (ONETOUCH VERIO) w/Device KIT Use as instructed to test sugars once daily.  Dx Code: E11.9  . calcium-vitamin D (OSCAL-500) 500-400 MG-UNIT tablet Take 1 tablet by mouth 2 (two) times daily. (Patient taking differently: Take 1 tablet by mouth daily. )  . diclofenac sodium (VOLTAREN) 1 % GEL Apply 2 g topically 4 (four) times daily. (Patient taking differently: Apply 1 application topically  at bedtime. Apply to shoulders)  . Eliquis DVT/PE Starter Pack (ELIQUIS STARTER PACK) 5 MG TABS Take as directed on package: start with two-'5mg'$  tablets twice daily for 7 days. On day 8, switch to one-'5mg'$  tablet twice daily. (Patient taking differently: Take 5-10 mg by mouth See admin instructions. Ordered 03/04/19:  Take two-'5mg'$  tablets twice daily for 7 days. On day 8, switch to one-'5mg'$  tablet twice daily.)  . famotidine (PEPCID) 20 MG tablet Take 1 tablet (20 mg total) by mouth 2 (two) times daily.  . fish oil-omega-3 fatty acids 1000 MG capsule Take 1 g by mouth daily.   [name] gabapentin (NEURONTIN) 100 MG capsule Take 1 capsule (100 mg total) by mouth 3 (three) times daily.  [name] glucose blood (ONETOUCH VERIO) test strip Use as instructed to test sugars once daily.  Dx Code: E11.9  . glucose blood test strip Use as instructed  . iron polysaccharides (FERREX 150) 150 MG capsule Take 1 capsule (150 mg total) by mouth daily.  [name] ketoconazole (NIZORAL) 2 % cream APPLY TO AFFECTED AREA TWICE A DAY (Patient taking differently: Apply 1 application topically daily as needed (Rash). )  . Lancet Devices (ONE TOUCH DELICA LANCING DEV) MISC Use as instructed to test sugars once daily.  Dx Code: E11.9  . losartan (COZAAR) 25 MG tablet Take 1 tablet by mouth once daily  . metoprolol tartrate (LOPRESSOR) 25 MG tablet Take 1 tablet by mouth twice daily  . OneTouch Delica Lancets 26J MISC Use as instructed to test sugars once daily.  Dx Code: E11.9  . polyethylene glycol powder (GLYCOLAX/MIRALAX) powder Take 17 g by mouth 2 (two) times daily as needed. (Patient taking differently: Take 17 g by mouth 2 (two) times daily as needed (constipation). )  . traMADol (ULTRAM) 50 MG tablet TAKE 1 TABLET BY MOUTH EVERY 6 HOURS AS NEEDED FOR MODERATE PAIN  . triamcinolone cream (KENALOG) 0.1 % apply to affected area twice a day (Patient taking differently: Apply 1 application topically daily as needed (itching). )    Allergies    Allergen Reactions  . Lisinopril Cough     SOCIAL HISTORY/FAMILY HISTORY   Social History   Tobacco Use  . Smoking status: Never Smoker  . Smokeless tobacco: Never Used  Substance Use Topics  . Alcohol use: No    Alcohol/week: 0.0 standard drinks  . Drug use: No   Social History   Social History Narrative   Health Care POA:    Emergency Contact: 1. Baltazar Apo [phone] (First contact)  2. daughter, Erin Coffey, [phone] (second contact)   Two dgt and one son   End of Life Plan: gave pt AD information phamplet   Who lives with you: self   Any pets: none   Diet: Pt has a varied diet of protein, starch and vegetables.   Exercise: Pt reports being very active   Seatbelts: Pt reports wearing seatbelt when in vehicles.    Hobbies: cooking, crossword puzzles.       Family History family history includes Cancer (age of onset: 33) in her sister; Cirrhosis in her sister; Diabetes in her brother, mother, sister, and sister; Heart disease in her father, mother, and sister; Hypertension in her brother, father, and mother; Kidney disease in her sister. F & M - MI in 60s    OBJCTIVE -PE, EKG, labs   Wt Readings from Last 3 Encounters:  09/02/19 300 lb (136.1 kg)  09/01/19 295 lb (133.8 kg)  08/30/19 295 lb (133.8 kg)    Physical Exam: BP (!) 177/77   Pulse 66   Ht '5\' 9"'$  (1.753 m)   Wt 300 lb (136.1 kg)   SpO2 100%   BMI 44.30 kg/m  Physical Exam  Constitutional: She is oriented to person, place, and time. She appears well-developed and well-nourished. No distress.  Morbidly obese.  Well-groomed  HENT:  Head: Normocephalic and atraumatic.  Eyes: Pupils are equal, round, and reactive to light. Conjunctivae and EOM are normal.  Neck: No hepatojugular reflux and no JVD present. Carotid bruit is not present.  Difficult to assess JVD due to body habitus  Cardiovascular: Normal rate, regular rhythm, S1 normal, S2 normal, intact  distal pulses and normal pulses.  Occasional extrasystoles are present. PMI is not displaced (Unable to assess due to body habitus). Exam reveals distant heart sounds. Exam reveals no gallop and no friction rub.  No murmur heard. Pulmonary/Chest: Effort normal. No respiratory distress. She exhibits tenderness (Some mild costosternal tenderness).  Distant breath sounds without any rales or rhonchi.  Abdominal: Soft. Bowel sounds are normal. She exhibits no distension. There is no abdominal tenderness. There is no rebound.  Musculoskeletal:        General: Edema (Trivial) present. Normal range of motion.     Cervical back: Normal range of motion and neck supple.  Neurological: She is alert and oriented to person, place, and time. No cranial nerve deficit.  Skin: Skin is warm and dry.  Psychiatric: She has a normal mood and affect. Her behavior is normal. Judgment and thought content normal.  Vitals reviewed.   Adult ECG Report EKGs from ER visits reviewed.  Agree with findings.  No acute symptoms.  Recent Labs: Lab Results  Component Value Date   CHOL 156 03/07/2019   HDL 75 03/07/2019   LDLCALC 70 03/07/2019   LDLDIRECT 98 02/02/2014   TRIG 53 03/07/2019   CHOLHDL 2.1 03/07/2019   Lab Results  Component Value Date   CREATININE 1.44 (H) 09/01/2019   BUN 18 09/01/2019   NA 136 09/01/2019   K 4.3 09/01/2019   CL 103 09/01/2019   CO2 22 09/01/2019    ASSESSMENT/PLAN    Problem List Items Addressed This Visit    Hyperlipidemia associated with type 2 diabetes mellitus ([place]) (Chronic)    Most recent LDL 70 as of last July on current dose of atorvastatin.  Stable for now.      Relevant Orders   Basic metabolic panel   HYPERTENSION, BENIGN ESSENTIAL (Chronic)    Blood  pressure is very high today.  She is very anxious and is not taking any of her medications today.  We can reassess her pressures and see him back, but I suspect that we may need to potentially increase amlodipine or  losartan dose.  With resting heart of 66, would be reluctant to increase metoprolol dose.      Relevant Orders   Basic metabolic panel   Pulmonary emboli (HCC) (Chronic)    Remains on Eliquis with no bleeding issues.      Palpitations    Supposedly there was a history of A. fib, but I have not seen any history of that.  She had previously declined cardiology follow-up for palpitations but now has been to the ER twice with symptoms of palpitations and chest discomfort.  Plan: Cardiac event monitor.    Continue beta-blocker.      Relevant Orders   Basic metabolic panel   CARDIAC EVENT MONITOR   Anginal chest pain at rest [facility]) - Primary    She has significant cardiac risk factors of hypertension, hyperlipidemia, obesity and diabetes.  She is now describing chest discomfort that occurred initially at rest but is also happening associated with exertional dyspnea.  The presence of significant risk factors increases likelihood of CAD and therefore GXT would not be sufficient.  Myoview would not be effective because of her obesity.  I think coronary CT angiogram is the best option.  This will give [name] both anatomy and potential physiology with CT FFR.  For now we will await results of her studies.  She is on amlodipine, atorvastatin and metoprolol as potential antianginals.  So she is already on good medications.   Not currently on aspirin because she is on apixaban.      Relevant Orders   Basic metabolic panel   CARDIAC EVENT MONITOR   Shortness of breath    Exertional dyspnea associated with exertion but also associated with palpitations and chest discomfort. Plan will be to check coronary CTA.  Low threshold to consider echo.  Some exertional dyspnea could easily be related to obesity and deconditioning.  But this currently episode is definitely worse with exertional dyspnea since an onset of chest discomfort.      Relevant Orders   Basic metabolic panel   CARDIAC EVENT MONITOR         COVID-19 Education: The signs and symptoms of COVID-19 were discussed with the patient and how to seek care for testing (follow up with PCP or arrange E-visit).   The importance of social distancing was discussed today.  I spent a tota of 22 minutes with the patient and chart review. >  50% of the time was spent in direct patient consultation.  Additional time spent with chart review (studies, outside notes, etc): 12 Total Time: 34 min   Current medicines are reviewed at length with the patient today.  (+/- concerns) none   Patient Instructions / Medication Changes & Studies & Tests Ordered   Patient Instructions  Once we have confirmed authorization from your insurance company, we will call you to set up a date and time for your coronary CT angiogram.   Medication Instructions:  Your physician recommends that you continue on your current medications as directed. Please refer to the Current Medication list given to you today.  *If you need a refill on your cardiac medications before your next appointment, please call your pharmacy*  Lab Work: Your physician recommends that you return for lab work within 1  week of scheduled CTA.  If you have labs (blood work) drawn today and your tests are completely normal, you will receive your results only by: [name] MyChart Message (if you have MyChart) OR . A paper copy in the mail If you have any lab test that is abnormal or we need to change your treatment, we will call you to review the results.  Testing/Procedures: Your physician has recommended that you wear a 30 day event monitor. Event monitors are medical devices that record the heart's electrical activity. Doctors most often [name] these monitors to diagnose arrhythmias. Arrhythmias are problems with the speed or rhythm of the heartbeat. The monitor is a small, portable device. You can wear one while you do your normal daily activities. This is usually used to diagnose what is causing  palpitations/syncope (passing out).  Follow-Up: At [facility], you and your health needs are our priority.  As part of our continuing mission to provide you with exceptional heart care, we have created designated Provider Care Teams.  These Care Teams include your primary Cardiologist (physician) and Advanced Practice Providers (APPs -  Physician Assistants and Nurse Practitioners) who all work together to provide you with the care you need, when you need it.  Your next appointment:   6-8 week(s)  The format for your next appointment:   In Person  Provider:   Glenetta Hew, MD    Studies Ordered:   Orders Placed This Encounter  Procedures  . CT CORONARY MORPH W/CTA COR W/SCORE W/CA W/CM &/OR WO/CM  . CT CORONARY FRACTIONAL FLOW RESERVE DATA PREP  . CT CORONARY FRACTIONAL FLOW RESERVE FLUID ANALYSIS  . Basic metabolic panel  . CARDIAC EVENT MONITOR     Glenetta Hew, M.D., M.S. Interventional Cardiologist   Pager # [phone] Phone # [phone] [address] [place], [place] [address]   Thank you for choosing Heartcare at [facility]!!

## 2019-09-05 ENCOUNTER — Telehealth: Payer: Self-pay | Admitting: *Deleted

## 2019-09-05 NOTE — Telephone Encounter (Signed)
Patient enrolled for [employer] to ship a 30 day cardiac event monitor to her home.

## 2019-09-07 ENCOUNTER — Encounter: Payer: Self-pay | Admitting: Cardiology

## 2019-09-07 NOTE — Assessment & Plan Note (Signed)
Remains on Eliquis with no bleeding issues.

## 2019-09-07 NOTE — Assessment & Plan Note (Signed)
Most recent LDL 70 as of last July on current dose of atorvastatin.  Stable for now.

## 2019-09-07 NOTE — Assessment & Plan Note (Signed)
Blood pressure is very high today.  She is very anxious and is not taking any of her medications today.  We can reassess her pressures and see him back, but I suspect that we may need to potentially increase amlodipine or losartan dose.  With resting heart of 66, would be reluctant to increase metoprolol dose.

## 2019-09-07 NOTE — Assessment & Plan Note (Signed)
She has significant cardiac risk factors of hypertension, hyperlipidemia, obesity and diabetes.  She is now describing chest discomfort that occurred initially at rest but is also happening associated with exertional dyspnea.  The presence of significant risk factors increases likelihood of CAD and therefore GXT would not be sufficient.  Myoview would not be effective because of her obesity.  I think coronary CT angiogram is the best option.  This will give [name] both anatomy and potential physiology with CT FFR.  For now we will await results of her studies.  She is on amlodipine, atorvastatin and metoprolol as potential antianginals.  So she is already on good medications.   Not currently on aspirin because she is on apixaban.

## 2019-09-07 NOTE — Assessment & Plan Note (Signed)
Supposedly there was a history of A. fib, but I have not seen any history of that.  She had previously declined cardiology follow-up for palpitations but now has been to the ER twice with symptoms of palpitations and chest discomfort.  Plan: Cardiac event monitor.    Continue beta-blocker.

## 2019-09-07 NOTE — Assessment & Plan Note (Signed)
Exertional dyspnea associated with exertion but also associated with palpitations and chest discomfort. Plan will be to check coronary CTA.  Low threshold to consider echo.  Some exertional dyspnea could easily be related to obesity and deconditioning.  But this currently episode is definitely worse with exertional dyspnea since an onset of chest discomfort.

## 2019-09-09 ENCOUNTER — Telehealth: Payer: Self-pay | Admitting: Cardiology

## 2019-09-09 ENCOUNTER — Other Ambulatory Visit: Payer: Self-pay

## 2019-09-09 ENCOUNTER — Encounter: Payer: Self-pay | Admitting: Podiatry

## 2019-09-09 ENCOUNTER — Ambulatory Visit: Payer: Medicare Other | Admitting: Podiatry

## 2019-09-09 DIAGNOSIS — M79674 Pain in right toe(s): Secondary | ICD-10-CM | POA: Diagnosis not present

## 2019-09-09 DIAGNOSIS — M79675 Pain in left toe(s): Secondary | ICD-10-CM | POA: Diagnosis not present

## 2019-09-09 DIAGNOSIS — B351 Tinea unguium: Secondary | ICD-10-CM | POA: Diagnosis not present

## 2019-09-09 NOTE — Telephone Encounter (Signed)
Returned patient call. She has trouble comprehending things and was asking for help with her monitor. I gave her the option of calling the monitor company this weekend and they would walk her though it or she could come in on Monday. She would prefer to have help applying and instructions in person. She was scheduled for Monday at 3pm.

## 2019-09-09 NOTE — Telephone Encounter (Signed)
New Message:   Pt said she received her Monitor yesterday. She says she does not know how to put it on and does not have anybody to put it on. She wants to know if she can come in and get soemboy to help her or show her how to put it on please?

## 2019-09-09 NOTE — Patient Instructions (Signed)
Diabetes Mellitus and Foot Care Foot care is an important part of your health, especially when you have diabetes. Diabetes may cause you to have problems because of poor blood flow (circulation) to your feet and legs, which can cause your skin to:  Become thinner and drier.  Break more easily.  Heal more slowly.  Peel and crack. You may also have nerve damage (neuropathy) in your legs and feet, causing decreased feeling in them. This means that you may not notice minor injuries to your feet that could lead to more serious problems. Noticing and addressing any potential problems early is the best way to prevent future foot problems. How to care for your feet Foot hygiene  Wash your feet daily with warm water and mild soap. Do not use hot water. Then, pat your feet and the areas between your toes until they are completely dry. Do not soak your feet as this can dry your skin.  Trim your toenails straight across. Do not dig under them or around the cuticle. File the edges of your nails with an emery board or nail file.  Apply a moisturizing lotion or petroleum jelly to the skin on your feet and to dry, brittle toenails. Use lotion that does not contain alcohol and is unscented. Do not apply lotion between your toes. Shoes and socks  Wear clean socks or stockings every day. Make sure they are not too tight. Do not wear knee-high stockings since they may decrease blood flow to your legs.  Wear shoes that fit properly and have enough cushioning. Always look in your shoes before you put them on to be sure there are no objects inside.  To break in new shoes, wear them for just a few hours a day. This prevents injuries on your feet. Wounds, scrapes, corns, and calluses  Check your feet daily for blisters, cuts, bruises, sores, and redness. If you cannot see the bottom of your feet, use a mirror or ask someone for help.  Do not cut corns or calluses or try to remove them with medicine.  If you  find a minor scrape, cut, or break in the skin on your feet, keep it and the skin around it clean and dry. You may clean these areas with mild soap and water. Do not clean the area with peroxide, alcohol, or iodine.  If you have a wound, scrape, corn, or callus on your foot, look at it several times a day to make sure it is healing and not infected. Check for: ? Redness, swelling, or pain. ? Fluid or blood. ? Warmth. ? Pus or a bad smell. General instructions  Do not cross your legs. This may decrease blood flow to your feet.  Do not use heating pads or hot water bottles on your feet. They may burn your skin. If you have lost feeling in your feet or legs, you may not know this is happening until it is too late.  Protect your feet from hot and cold by wearing shoes, such as at the beach or on hot pavement.  Schedule a complete foot exam at least once a year (annually) or more often if you have foot problems. If you have foot problems, report any cuts, sores, or bruises to your health care provider immediately. Contact a health care provider if:  You have a medical condition that increases your risk of infection and you have any cuts, sores, or bruises on your feet.  You have an injury that is not   healing.  You have redness on your legs or feet.  You feel burning or tingling in your legs or feet.  You have pain or cramps in your legs and feet.  Your legs or feet are numb.  Your feet always feel cold.  You have pain around a toenail. Get help right away if:  You have a wound, scrape, corn, or callus on your foot and: ? You have pain, swelling, or redness that gets worse. ? You have fluid or blood coming from the wound, scrape, corn, or callus. ? Your wound, scrape, corn, or callus feels warm to the touch. ? You have pus or a bad smell coming from the wound, scrape, corn, or callus. ? You have a fever. ? You have a red line going up your leg. Summary  Check your feet every day  for cuts, sores, red spots, swelling, and blisters.  Moisturize feet and legs daily.  Wear shoes that fit properly and have enough cushioning.  If you have foot problems, report any cuts, sores, or bruises to your health care provider immediately.  Schedule a complete foot exam at least once a year (annually) or more often if you have foot problems. This information is not intended to replace advice given to you by your health care provider. Make sure you discuss any questions you have with your health care provider. Document Revised: 04/20/2019 Document Reviewed: 08/29/2016 Elsevier Patient Education  2020 [employer]  

## 2019-09-10 NOTE — Progress Notes (Signed)
Subjective: Erin Coffey presents today for follow up of painful mycotic nails b/l that are difficult to trim. Pain interferes with ambulation. Aggravating factors include wearing enclosed shoe gear. Pain is relieved with periodic professional debridement.   Allergies  Allergen Reactions  . Lisinopril Cough     Objective: There were no vitals filed for this visit.  Vascular Examination:  Capillary fill time to digits <3s b/l, palpable DP pulses b/l, palpable PT pulses b/l, pedal hair absent b/l and skin temperature gradient within normal limits b/l  Dermatological Examination: Pedal skin with normal turgor, texture and tone bilaterally, no open wounds bilaterally, no interdigital macerations bilaterally and toenails 1-5 b/l elongated, dystrophic, thickened, crumbly with subungual debris  Musculoskeletal: Normal muscle strength 5/5 to all lower extremity muscle groups bilaterally, no gross bony deformities bilaterally and no pain crepitus or joint limitation noted with ROM b/l  Neurological: Protective sensation intact 5/5 intact bilaterally with 10g monofilament b/l and vibratory sensation intact b/l  Assessment: 1. Pain due to onychomycosis of toenails of both feet      Plan: -Continue diabetic foot care principles. Literature dispensed on today.  -Toenails 1-5 b/l were debrided in length and girth without iatrogenic bleeding. -Patient to continue soft, supportive shoe gear daily. -Patient to report any pedal injuries to medical professional immediately. -Patient/POA to call should there be question/concern in the interim.  Return in about 3 months (around 12/08/2019) for diabetic nail trim.

## 2019-09-12 ENCOUNTER — Ambulatory Visit (INDEPENDENT_AMBULATORY_CARE_PROVIDER_SITE_OTHER): Payer: Medicare Other

## 2019-09-12 ENCOUNTER — Encounter: Payer: Self-pay | Admitting: Family Medicine

## 2019-09-12 ENCOUNTER — Other Ambulatory Visit: Payer: Self-pay

## 2019-09-12 ENCOUNTER — Ambulatory Visit (INDEPENDENT_AMBULATORY_CARE_PROVIDER_SITE_OTHER): Payer: Medicare Other | Admitting: Family Medicine

## 2019-09-12 ENCOUNTER — Encounter: Payer: Self-pay | Admitting: *Deleted

## 2019-09-12 VITALS — BP 135/70 | HR 76 | Temp 98.3°F | Wt 294.0 lb

## 2019-09-12 DIAGNOSIS — R0600 Dyspnea, unspecified: Secondary | ICD-10-CM | POA: Diagnosis not present

## 2019-09-12 DIAGNOSIS — E119 Type 2 diabetes mellitus without complications: Secondary | ICD-10-CM | POA: Diagnosis not present

## 2019-09-12 DIAGNOSIS — R062 Wheezing: Secondary | ICD-10-CM

## 2019-09-12 DIAGNOSIS — I208 Other forms of angina pectoris: Secondary | ICD-10-CM | POA: Diagnosis not present

## 2019-09-12 DIAGNOSIS — R002 Palpitations: Secondary | ICD-10-CM | POA: Diagnosis not present

## 2019-09-12 DIAGNOSIS — R072 Precordial pain: Secondary | ICD-10-CM | POA: Diagnosis not present

## 2019-09-12 DIAGNOSIS — R0602 Shortness of breath: Secondary | ICD-10-CM

## 2019-09-12 HISTORY — PX: TRANSTHORACIC ECHOCARDIOGRAM: SHX275

## 2019-09-12 HISTORY — PX: OTHER SURGICAL HISTORY: SHX169

## 2019-09-12 LAB — POCT GLYCOSYLATED HEMOGLOBIN (HGB A1C): HbA1c, POC (controlled diabetic range): 6.2 % (ref 0.0–7.0)

## 2019-09-12 NOTE — Progress Notes (Signed)
   CHIEF COMPLAINT / HPI:  Wheezing and shortness of breath, chronic Patient reports that she had to years of wheezing and shortness of breath.  This is also sometimes associated with chest discomfort and fast heartbeat.  Was recently seen in the emergency department for chest pain.  Does have a history of PE, but had negative CTA PE on 03/2019.  Troponins were negative.  Patient is already seen cardiology earlier this month who was planning to do a cardiac event monitor and coronary CT. patient reports that the wheezing is intermittent.  Does not seem to be associated with shortness of breath.  Does not happen at night.  Patient denies any snoring or issues sleeping.  Not associate with cough, fevers, chills.  Patient with recent CBC that does not show anemia.  Electrolytes were within normal limits.  Diabetes mellitus type 2 Patient with A1c goal less than 7.  Diet controlled, not on any medications.  Associated diabetic disease risk factors include dyslipidemia with LDL less than 70.  Blood pressure at goal.  Patient follows with optometry.  Recently seen.  Will do request records to have follow-up eye exam.   PERTINENT  PMH / PSH: History of pulmonary embolism anticoagulated on Eliquis Multiple cardiac risk factors including history of diabetes, hypertension, hyperlipidemia.  All are well controlled.  OBJECTIVE: BP 135/70   Pulse 76   Temp 98.3 F (36.8 C) (Oral)   Wt 294 lb (133.4 kg)   SpO2 99%   BMI 43.42 kg/m   Gen: NAD, resting comfortably CV: RRR with no murmurs appreciated Pulm: NWOB, CTAB with no crackles, wheezes, or rhonchi MSK: no edema, cyanosis, or clubbing noted Skin: warm, dry Neuro: grossly normal, moves all extremities Psych: Normal affect and thought content  ASSESSMENT / PLAN:  Wheezing Patient with wheezing and dyspnea.  Currently followed by cardiology with plan to work-up for coronary CT and event monitor. Fairly recent CTA PE with out evidence of recurrent  pulmonary embolism.  CBC without anemia.  Need to rule out structural and functional dysfunction of the heart, although patient does not appear to be fluid overloaded and is without signs of murmur.  Patient's history of obesity, possibly OHS versus OSA.  Patient without history of smoking or asthma or COPD.  Also has history of GERD, controlled on Prilosec.  Possibly worsening GERD, consider adding H2 blocker to PPI if no improvement.  Also consider referral to pulmonology for PFTs.  Could cause wheeze.  -Follow cardiac work-up -Echocardiogram -Sleep study -Follow-up in 1 month     Bonnita Hollow, MD [place]

## 2019-09-12 NOTE — Assessment & Plan Note (Signed)
Patient with wheezing and dyspnea.  Currently followed by cardiology with plan to work-up for coronary CT and event monitor. Fairly recent CTA PE with out evidence of recurrent pulmonary embolism.  CBC without anemia.  Need to rule out structural and functional dysfunction of the heart, although patient does not appear to be fluid overloaded and is without signs of murmur.  Patient's history of obesity, possibly OHS versus OSA.  Patient without history of smoking or asthma or COPD.  Also has history of GERD, controlled on Prilosec.  Possibly worsening GERD, consider adding H2 blocker to PPI if no improvement.  Also consider referral to pulmonology for PFTs.  Could cause wheeze.  -Follow cardiac work-up -Echocardiogram -Sleep study -Follow-up in 1 month

## 2019-09-12 NOTE — Progress Notes (Signed)
Patient ID: Erin Coffey, female   DOB: [birth year], 74 y.o.   MRN: [id number] Patient came into office to have 30 day [employer] cardiac event monitor, which was mailed to her home, applied.

## 2019-09-12 NOTE — Assessment & Plan Note (Signed)
Well-controlled with diet.  Continue monitor A1c every 6 months.

## 2019-09-12 NOTE — Patient Instructions (Signed)
It was a pleasure to see you today! Thank you for choosing [facility] for your primary care. Erin Coffey was seen for wheezing, shortness of breath.   For your shortness of breath and wheezing, we are going to to get an additional picture of your heart with what is called an echocardiogram using ultrasound.  We are also going to order you a sleep study to rule out sleep apnea.   Your A1c and blood pressures are good today.  Best,  Marny Lowenstein, MD, MS FAMILY MEDICINE RESIDENT - PGY3 09/12/2019 2:00 PM

## 2019-09-13 DIAGNOSIS — R809 Proteinuria, unspecified: Secondary | ICD-10-CM | POA: Diagnosis not present

## 2019-09-13 DIAGNOSIS — D649 Anemia, unspecified: Secondary | ICD-10-CM | POA: Diagnosis not present

## 2019-09-13 DIAGNOSIS — E1122 Type 2 diabetes mellitus with diabetic chronic kidney disease: Secondary | ICD-10-CM | POA: Diagnosis not present

## 2019-09-13 DIAGNOSIS — N183 Chronic kidney disease, stage 3 unspecified: Secondary | ICD-10-CM | POA: Diagnosis not present

## 2019-09-13 DIAGNOSIS — I129 Hypertensive chronic kidney disease with stage 1 through stage 4 chronic kidney disease, or unspecified chronic kidney disease: Secondary | ICD-10-CM | POA: Diagnosis not present

## 2019-09-14 ENCOUNTER — Other Ambulatory Visit (HOSPITAL_COMMUNITY): Payer: Medicare Other

## 2019-09-15 ENCOUNTER — Ambulatory Visit (HOSPITAL_COMMUNITY)
Admission: RE | Admit: 2019-09-15 | Discharge: 2019-09-15 | Disposition: A | Discharge status: Home / Self Care | Payer: Medicare Other | Source: Ambulatory Visit | Attending: Family Medicine | Admitting: Family Medicine

## 2019-09-15 ENCOUNTER — Telehealth: Payer: Self-pay | Admitting: Family Medicine

## 2019-09-15 ENCOUNTER — Other Ambulatory Visit: Payer: Self-pay

## 2019-09-15 DIAGNOSIS — R0600 Dyspnea, unspecified: Secondary | ICD-10-CM

## 2019-09-15 DIAGNOSIS — R062 Wheezing: Secondary | ICD-10-CM | POA: Diagnosis not present

## 2019-09-15 DIAGNOSIS — I071 Rheumatic tricuspid insufficiency: Secondary | ICD-10-CM | POA: Diagnosis not present

## 2019-09-15 NOTE — Progress Notes (Signed)
  Echocardiogram 2D Echocardiogram has been performed.  Erin Coffey M 09/15/2019, 8:35 AM

## 2019-09-15 NOTE — Telephone Encounter (Signed)
Patient echocardiogram shows G1 DD with EF of 60 to 65%.  Seems unlikely that patient shortness of breath is related to heart failure.  Recommend continued work-up with getting home sleep study.  Patient also follows up with cardiology and March.  Called patient to discuss reassuring findings from echocardiogram.  She appreciated the call.

## 2019-09-19 ENCOUNTER — Other Ambulatory Visit: Payer: Self-pay | Admitting: *Deleted

## 2019-09-19 DIAGNOSIS — M159 Polyosteoarthritis, unspecified: Secondary | ICD-10-CM

## 2019-09-19 MED ORDER — GABAPENTIN 100 MG PO CAPS: 100.0000 mg | ORAL_CAPSULE | Freq: Three times a day (TID) | ORAL | 3 refills | Status: DC

## 2019-10-01 ENCOUNTER — Ambulatory Visit: Payer: Medicare Other | Attending: Internal Medicine

## 2019-10-01 DIAGNOSIS — Z23 Encounter for immunization: Secondary | ICD-10-CM

## 2019-10-01 NOTE — Progress Notes (Signed)
   Covid-19 Vaccination Clinic  Name:  Erin Coffey    MRN: [id number] DOB: 13-Oct-1945  10/01/2019  [name] was observed post Covid-19 immunization for 15 minutes without incidence. She was provided with Vaccine Information Sheet and instruction to access the V-Safe system.   [name] was instructed to call 911 with any severe reactions post vaccine: [name] Difficulty breathing  . Swelling of your face and throat  . A fast heartbeat  . A bad rash all over your body  . Dizziness and weakness    Immunizations Administered    Name Date Dose VIS Date Route   Pfizer COVID-19 Vaccine 10/01/2019 10:44 AM 0.3 mL 07/22/2019 Intramuscular   Manufacturer: [place]   Lot: [id number]   [place]: [address]-0

## 2019-10-10 DIAGNOSIS — I2510 Atherosclerotic heart disease of native coronary artery without angina pectoris: Secondary | ICD-10-CM | POA: Insufficient documentation

## 2019-10-10 HISTORY — DX: Atherosclerotic heart disease of native coronary artery without angina pectoris: I25.10

## 2019-10-21 ENCOUNTER — Ambulatory Visit: Payer: Medicare Other | Admitting: Cardiology

## 2019-10-21 ENCOUNTER — Other Ambulatory Visit: Payer: Self-pay

## 2019-10-21 ENCOUNTER — Encounter: Payer: Self-pay | Admitting: Cardiology

## 2019-10-21 VITALS — BP 146/70 | HR 64 | Temp 93.9°F | Ht 69.0 in | Wt 298.0 lb

## 2019-10-21 DIAGNOSIS — R002 Palpitations: Secondary | ICD-10-CM

## 2019-10-21 DIAGNOSIS — I208 Other forms of angina pectoris: Secondary | ICD-10-CM

## 2019-10-21 DIAGNOSIS — I10 Essential (primary) hypertension: Secondary | ICD-10-CM | POA: Diagnosis not present

## 2019-10-21 DIAGNOSIS — R0602 Shortness of breath: Secondary | ICD-10-CM | POA: Diagnosis not present

## 2019-10-21 DIAGNOSIS — E785 Hyperlipidemia, unspecified: Secondary | ICD-10-CM

## 2019-10-21 DIAGNOSIS — E1169 Type 2 diabetes mellitus with other specified complication: Secondary | ICD-10-CM | POA: Diagnosis not present

## 2019-10-21 LAB — BASIC METABOLIC PANEL
BUN/Creatinine Ratio: 11 — ABNORMAL LOW (ref 12–28)
BUN: 14 mg/dL (ref 8–27)
CO2: 20 mmol/L (ref 20–29)
Calcium: 8.9 mg/dL (ref 8.7–10.3)
Chloride: 101 mmol/L (ref 96–106)
Creatinine, Ser: 1.22 mg/dL — ABNORMAL HIGH (ref 0.57–1.00)
GFR calc Af Amer: 52 mL/min/{1.73_m2} — ABNORMAL LOW (ref >59)
GFR calc non Af Amer: 45 mL/min/{1.73_m2} — ABNORMAL LOW (ref >59)
Glucose: 86 mg/dL (ref 65–99)
Potassium: 5 mmol/L (ref 3.5–5.2)
Sodium: 136 mmol/L (ref 134–144)

## 2019-10-21 NOTE — Progress Notes (Signed)
Primary Care Provider: Bonnita Hollow, MD Cardiologist: No primary care provider on file. Electrophysiologist:   Clinic Note: Chief Complaint  Patient presents with  . Follow-up    6 weeks.;  Test results, but CTA not done yet.  . Palpitations    Notably improved  . Chest Pain    Nothing like she had before.  Just mild aching    HPI:    Erin Coffey is a 74 y.o. female with a PMH notable for history of DVT and PE (not currently on Eliquis), DM-2 and CKD-3 who presents today for 68-monthfollow-up to evaluate chest discomfort and palpitations with dizziness leading to ER visits..Erin Coffey was last seen on September 02, 2019 in response to 2 ER visits in January 2021 noted chest comfort, palpitations or dizziness.  Recent Hospitalizations:   No further hospitalizations.  Reviewed  CV studies:    The following studies were reviewed today: (if available, images/films reviewed: From Epic Chart or Care Everywhere)  Event monitor Feb 20201: Mostly sinus rhythm, average rate 60 bpm.  Range 45 to 190 bpm.  No critical events indicating any arrhythmias or pauses.  For patient responses noted PVCs.  Less than 1% PVCs noted.   Transthoracic Echo 09/2019: Normal EF - 60 to 65%.  Mild LVH.  GR 1 DD.  Normal RV.  Trivial MR.  Normal IVC.  Normal PA pressures.  LE V Dopplers: NO DVT  Cor CTA Pending.   Interval History:   Erin NORDHOFFpresents here for follow-up indicating that overall, she has not had any further chest pain and her palpitations are notably improved.  She still has off-and-on chest discomfort but not necessarily exertional.  Some some exertional pain but others are not exertional.  Unfortunately, she has been gaining weight over the last couple months, not being as active.  As such, she is noticing exertional dyspnea, but it is not any worse than it was before.  She certainly has not had any chest pain like she had when she went to the emergency  room.  Short little episodes of intermittent discomfort. Thankfully she is not had any further UTIs.    CV Review of Symptoms (Summary): positive for - chest pain, dyspnea on exertion, palpitations, shortness of breath and Lightheadedness with near syncope, but no true syncope.  Chest discomfort symptoms have improved. negative for - loss of consciousness, orthopnea, paroxysmal nocturnal dyspnea or TIA/amaurosis fugax; claudication  The patient does not have symptoms concerning for COVID-19 infection (fever, chills, cough, or new shortness of breath).  The patient is practicing social distancing & masking.   REVIEWED OF SYSTEMS   A comprehensive ROS was performed. Review of Systems  Constitutional: Positive for malaise/fatigue. Negative for weight loss (Thankfully, has stabilized her weight.).  HENT: Negative for congestion and nosebleeds.   Respiratory: Positive for shortness of breath. Negative for cough, sputum production and wheezing.   Cardiovascular:       Per HPI  Gastrointestinal: Negative for blood in stool and melena.  Genitourinary: Positive for frequency. Negative for dysuria (recent UTI Sx) and hematuria.  Musculoskeletal: Positive for back pain and joint pain. Negative for falls.  Neurological: Positive for dizziness and tingling. Negative for focal weakness and weakness.       Per HPI  Psychiatric/Behavioral: Negative for depression and memory loss. The patient is nervous/anxious and has insomnia.   All other systems reviewed and are negative.  I have reviewed and (if needed)  personally updated the patient's problem list, medications, allergies, past medical and surgical history, social and family history.   PAST MEDICAL HISTORY   Past Medical History:  Diagnosis Date  . Allergy   . Anemia, iron deficiency   . ANXIETY, SITUATIONAL 04/06/2009   Qualifier: Diagnosis of  By: [name]  MD, Colletta [place]    . Arthritis   . Back pain, thoracic 12/20/2012  . Carpal tunnel syndrome  12/07/2014  . Cholelithiasis 06/25/2012  . Chronic kidney disease   . DM type 2 goal A1C below 7.5 06/05/2008   Qualifier: Diagnosis of  By: [name]  MD, Colletta Maryland    . GASTROESOPHAGEAL REFLUX, NO ESOPHAGITIS 10/08/2006   Qualifier: Diagnosis of  By: [name]    . Goiter, unspecified 10/21/2007   Qualifier: Diagnosis of  By: [name] MD, [name]    . History of DVT (deep vein thrombosis) 05/05/2013   on Eliquis  . HYPERLIPIDEMIA 08/08/2008   Qualifier: Diagnosis of  By: [name]  MD, Colletta [place]    . HYPERTENSION, BENIGN ESSENTIAL 11/27/2006   Qualifier: Diagnosis of  By: [name] MD, [place]    . Personal history of colonic polyps 10/24/2009   Qualifier: Diagnosis of  By: [name] MD, Sandy Salaam   . Pica 08/08/2009   Qualifier: Diagnosis of  By: [name], [name]    . Polymyalgia rheumatica ([place]) 10/02/2009   Qualifier: Diagnosis of  By: [name] MD, Apolonio Schneiders    . Pulmonary embolism ([place]) - On long Term anticoagulation (Z79.01) 04/28/2011   Converted from warfarin to Eliquis; on long-term anticoagulation.  . Thyroid disease   . Trigger ring finger of left hand 11/02/2014    PAST SURGICAL HISTORY   Past Surgical History:  Procedure Laterality Date  . ABDOMINAL HYSTERECTOMY    . CARDIAC CATHETERIZATION  04/29/11   (Dr. Lia Foyer [facility]): LM - normal. LAD with 1 major Diag - normal, bifurcating Ramus - normal, Large (6-7 mm) Dominant LCx with 1 OM, 2 LPL branches & LPDA - non significant disease; small non-dominant RCA.    [name] CARDIAC EVENT MONITOR  09/2019   Mostly sinus rhythm, average rate 60 bpm.  Range 45 to 190 bpm.  No critical events indicating any arrhythmias or pauses.  For patient responses noted PVCs.  Less than 1% PVCs noted.   [name] CARPAL TUNNEL RELEASE Right 1996  . KNEE ARTHROSCOPY  Bilateral  . TRANSTHORACIC ECHOCARDIOGRAM  09/2019   Normal EF - 60 to 65%.  Mild LVH.  GR 1 DD.  Normal RV.  Trivial MR.  Normal IVC.  Normal PA pressures.  . TUBAL LIGATION     . Chest CTA-PE 03/18/2019: No  evidence of PE.  Stable cardiomegaly.  Mild aortic atherosclerosis.  (Compared to March 03, 2019 which showed mild right lower lobe PE without right heart strain -> however this initially study was probably not accurate)  MEDICATIONS/ALLERGIES   Current Meds  Medication Sig  . acetaminophen (TYLENOL) 650 MG CR tablet Take 1,300 mg by mouth every 8 (eight) hours as needed for pain.  [name] alendronate (FOSAMAX) 70 MG tablet Take 1 tablet (70 mg total) by mouth every 7 (seven) days. Take with a full glass of water on an empty stomach.  [name] amLODipine (NORVASC) 5 MG tablet Take 1 tablet (5 mg total) by mouth at bedtime.  [name] apixaban (ELIQUIS) 5 MG TABS tablet Take 1 tablet (5 mg total) by mouth 2 (two) times daily. Begin after completing starterpack for eliquis  . atorvastatin (LIPITOR) 40 MG  tablet Take 1 tablet (40 mg total) by mouth daily. (Patient taking differently: Take 40 mg by mouth every Tuesday, Thursday, and Saturday at 6 PM. )  . baclofen (LIORESAL) 10 MG tablet Take 1 tablet (10 mg total) by mouth daily as needed for muscle spasms.  . Blood Glucose Monitoring Suppl (ONETOUCH VERIO) w/Device KIT Use as instructed to test sugars once daily.  Dx Code: E11.9  . calcium-vitamin D (OSCAL-500) 500-400 MG-UNIT tablet Take 1 tablet by mouth 2 (two) times daily. (Patient taking differently: Take 1 tablet by mouth daily. )  . diclofenac sodium (VOLTAREN) 1 % GEL Apply 2 g topically 4 (four) times daily. (Patient taking differently: Apply 1 application topically at bedtime. Apply to shoulders)  . famotidine (PEPCID) 20 MG tablet Take 1 tablet (20 mg total) by mouth 2 (two) times daily.  . fish oil-omega-3 fatty acids 1000 MG capsule Take 1 g by mouth daily.   [name] gabapentin (NEURONTIN) 100 MG capsule Take 1 capsule (100 mg total) by mouth 3 (three) times daily. (Patient taking differently: Take 100 mg by mouth in the morning and at bedtime. )  . glucose blood (ONETOUCH VERIO) test strip Use as instructed to  test sugars once daily.  Dx Code: E11.9  . glucose blood test strip Use as instructed  . iron polysaccharides (FERREX 150) 150 MG capsule Take 1 capsule (150 mg total) by mouth daily.  [name] ketoconazole (NIZORAL) 2 % cream APPLY TO AFFECTED AREA TWICE A DAY (Patient taking differently: Apply 1 application topically daily as needed (Rash). )  . Lancet Devices (ONE TOUCH DELICA LANCING DEV) MISC Use as instructed to test sugars once daily.  Dx Code: E11.9  . losartan (COZAAR) 25 MG tablet Take 1 tablet by mouth once daily  . metoprolol tartrate (LOPRESSOR) 25 MG tablet Take 1 tablet by mouth twice daily  . OneTouch Delica Lancets 75O MISC Use as instructed to test sugars once daily.  Dx Code: E11.9  . polyethylene glycol powder (GLYCOLAX/MIRALAX) powder Take 17 g by mouth 2 (two) times daily as needed. (Patient taking differently: Take 17 g by mouth 2 (two) times daily as needed (constipation). )  . traMADol (ULTRAM) 50 MG tablet TAKE 1 TABLET BY MOUTH EVERY 6 HOURS AS NEEDED FOR MODERATE PAIN  . triamcinolone cream (KENALOG) 0.1 % apply to affected area twice a day (Patient taking differently: Apply 1 application topically daily as needed (itching). )  . [DISCONTINUED] Eliquis DVT/PE Starter Pack (ELIQUIS STARTER PACK) 5 MG TABS Take as directed on package: start with two-'5mg'$  tablets twice daily for 7 days. On day 8, switch to one-'5mg'$  tablet twice daily. (Patient taking differently: Take 5-10 mg by mouth See admin instructions. Ordered 03/04/19:  Take two-'5mg'$  tablets twice daily for 7 days. On day 8, switch to one-'5mg'$  tablet twice daily.)    Allergies  Allergen Reactions  . Lisinopril Cough   SOCIAL HISTORY/FAMILY HISTORY   Social History   Tobacco Use  . Smoking status: Never Smoker  . Smokeless tobacco: Never Used  Substance Use Topics  . Alcohol use: No    Alcohol/week: 0.0 standard drinks  . Drug use: No   Social History   Social History Narrative   Health Care POA:    Emergency  Contact: 1. Baltazar Apo [phone] (First contact)  2. daughter, Marlene Lard, [phone] (second contact)   Two dgt and one son   End of Life Plan: gave pt AD information phamplet   Who lives with you: self   Any pets: none   Diet: Pt has a varied diet of protein, starch and vegetables.   Exercise: Pt reports being very active   Seatbelts: Pt reports wearing seatbelt when in vehicles.    Hobbies: cooking, crossword puzzles.       Family History family history includes Cancer (age of onset: 8) in her sister; Cirrhosis in her sister; Diabetes in her brother, mother, sister, and sister; Heart disease in her father, mother, and sister; Hypertension in her brother, father, and mother; Kidney disease in her sister. F & M - MI in 16s    OBJCTIVE -PE, EKG, labs   Wt Readings from Last 3 Encounters:  10/21/19 298 lb (135.2 kg)  09/12/19 294 lb (133.4 kg)  09/02/19 300 lb (136.1 kg)    Physical Exam: BP (!) 146/70 (BP Location: Left Arm, Patient Position: Sitting, Cuff Size: Large)   Pulse 64   Temp (!) 93.9 F (34.4 C)   Ht '5\' 9"'$  (1.753 m)   Wt 298 lb (135.2 kg)   BMI 44.01 kg/m  Physical Exam  Constitutional: She is oriented to person, place, and time. She appears well-developed and well-nourished. No distress.  Well-groomed.  Healthy healthy-appearing but morbidly obese.  HENT:  Head: Normocephalic and atraumatic.  Neck: No hepatojugular reflux and no JVD present. Carotid bruit is not present.  Difficult to assess JVD due to body habitus  Cardiovascular: Normal rate, regular rhythm, S1 normal, S2 normal, intact distal pulses and normal pulses.  Occasional extrasystoles are present. PMI is not displaced (Unable to assess due to body habitus). Exam reveals distant heart sounds. Exam reveals no gallop and no friction rub.  No murmur heard. Pulmonary/Chest: Effort normal. No respiratory distress. She exhibits no tenderness.  Distant breath  sounds without any rales or rhonchi.  Musculoskeletal:        General: Edema (Trivial) present. Normal range of motion.     Cervical back: Normal range of motion and neck supple.  Neurological: She is alert and oriented to person, place, and time. No cranial nerve deficit.  Psychiatric: She has a normal mood and affect. Her behavior is normal. Judgment and thought content normal.  Vitals reviewed.   Adult ECG Report N/A  Recent Labs: Lab Results  Component Value Date   CHOL 156 03/07/2019   HDL 75 03/07/2019   LDLCALC 70 03/07/2019   LDLDIRECT 98 02/02/2014   TRIG 53 03/07/2019   CHOLHDL 2.1 03/07/2019   Lab Results  Component Value Date   CREATININE 1.22 (H) 10/21/2019   BUN 14 10/21/2019   NA 136 10/21/2019   K 5.0 10/21/2019   CL 101 10/21/2019   CO2 20 10/21/2019    ASSESSMENT/PLAN    Problem List Items Addressed This Visit    Hyperlipidemia associated with type 2 diabetes mellitus ([place]) (Chronic)    Most recent LDL of 70.  Acceptable for now with current dose of atorvastatin.      HYPERTENSION, BENIGN ESSENTIAL (Chronic)    Blood pressure not quite as high today as it was last time I saw her.  She is a little less anxious.  Continue to monitor and see what her pressures look like at the time of her coronary CTA.  May need to consider increasing titration of amlodipine.  Palpitations    Her event monitor was essentially normal with no arrhythmias noted. She also indicates that her palpitations are much better.  For now continue current dose of beta-blocker.      Anginal chest pain at rest [facility]) - Primary    Still concerned about the episode of chest pain she had and the fact she still having some intermittent symptoms along with some exertional dyspnea.  1 last test remaining is the coronary CT angiogram.  Thankfully, her echocardiogram looks pretty normal.\  Continue to await results of coronary CTA. With the possibility being coronary disease, she is  on statin, beta-blocker ARB and calcium channel blocker.  If her blood pressure is elevated, would potentially consider increasing either beta-blocker or amlodipine, however with heart rate of 64, amlodipine likely best. Not on aspirin because she is on apixaban for PE.        Patient Instructions  Medication Instructions:  No changes *If you need a refill on your cardiac medications before your next appointment, please call your pharmacy*   Lab Work: Not needed  Testing/Procedures:  keep appointment with CCTA at [place]    Follow-Up: At [facility], you and your health needs are our priority.  As part of our continuing mission to provide you with exceptional heart care, we have created designated Provider Care Teams.  These Care Teams include your primary Cardiologist (physician) and Advanced Practice Providers (APPs -  Physician Assistants and Nurse Practitioners) who all work together to provide you with the care you need, when you need it.  We recommend signing up for the patient portal called "MyChart".  Sign up information is provided on this After Visit Summary.  MyChart is used to connect with patients for Virtual Visits (Telemedicine).  Patients are able to view lab/test results, encounter notes, upcoming appointments, etc.  Non-urgent messages can be sent to your provider as well.   To learn more about what you can do with MyChart, go to [email or web address].    Your next appointment:   1 month(s)  The format for your next appointment:   Virtual Visit   Provider:   Glenetta Hew, MD   Other Instructions n/a   COVID-19 Education: The signs and symptoms of COVID-19 were discussed with the patient and how to seek care for testing (follow up with PCP or arrange E-visit).   The importance of social distancing was discussed today.  I spent a tota of 77mnutes with the patient and chart review. >  50% of the time was spent in direct patient consultation.    Additional time spent with chart review (studies, outside notes, etc): 8 Total Time: 228m  Current medicines are reviewed at length with the patient today.  (+/- concerns) none   Patient Instructions / Medication Changes & Studies & Tests Ordered   Patient Instructions  Medication Instructions:  No changes *If you need a refill on your cardiac medications before your next appointment, please call your pharmacy*   Lab Work: Not needed  Testing/Procedures:  keep appointment with CCTA at [place]    Follow-Up: [facility] are our [facility].  As part of our continuing mission to provide you with exceptional heart care, we have created designated Provider Care Teams.  These Care Teams include your primary Cardiologist (physician) and Advanced Practice Providers (APPs -  Physician Assistants and Nurse Practitioners) who all work together to provide you with the care you need, when you need it.  We recommend signing up for the patient portal called "MyChart".  Sign up information is provided on this After Visit Summary.  MyChart is used to connect with patients for Virtual Visits (Telemedicine).  Patients are able to view lab/test results, encounter notes, upcoming appointments, etc.  Non-urgent messages can be sent to your provider as well.   To learn more about what you can do with MyChart, go to [email or web address].    Your next appointment:   1 month(s)  The format for your next appointment:   Virtual Visit   Provider:   Glenetta Hew, MD   Other Instructions n/a    Studies Ordered:   No orders of the defined types were placed in this encounter.    Glenetta Hew, M.D., M.S. Interventional Cardiologist    [address] [place], [place] [id number]   Thank you for choosing Heartcare at [facility]!!

## 2019-10-21 NOTE — Patient Instructions (Signed)
Medication Instructions:  No changes *If you need a refill on your cardiac medications before your next appointment, please call your pharmacy*   Lab Work: Not needed  Testing/Procedures:  keep appointment with CCTA at [place]    Follow-Up: At [facility], you and your health needs are our priority.  As part of our continuing mission to provide you with exceptional heart care, we have created designated Provider Care Teams.  These Care Teams include your primary Cardiologist (physician) and Advanced Practice Providers (APPs -  Physician Assistants and Nurse Practitioners) who all work together to provide you with the care you need, when you need it.  We recommend signing up for the patient portal called "MyChart".  Sign up information is provided on this After Visit Summary.  MyChart is used to connect with patients for Virtual Visits (Telemedicine).  Patients are able to view lab/test results, encounter notes, upcoming appointments, etc.  Non-urgent messages can be sent to your provider as well.   To learn more about what you can do with MyChart, go to [email or web address].    Your next appointment:   1 month(s)  The format for your next appointment:   Virtual Visit   Provider:   Glenetta Hew, MD   Other Instructions n/a

## 2019-10-23 ENCOUNTER — Encounter: Payer: Self-pay | Admitting: Cardiology

## 2019-10-23 ENCOUNTER — Other Ambulatory Visit: Payer: Self-pay | Admitting: Family Medicine

## 2019-10-23 NOTE — Assessment & Plan Note (Signed)
Blood pressure not quite as high today as it was last time I saw her.  She is a little less anxious.  Continue to monitor and see what her pressures look like at the time of her coronary CTA.  May need to consider increasing titration of amlodipine.

## 2019-10-23 NOTE — Assessment & Plan Note (Signed)
Most recent LDL of 70.  Acceptable for now with current dose of atorvastatin.

## 2019-10-23 NOTE — Assessment & Plan Note (Addendum)
Her event monitor was essentially normal with no arrhythmias noted. She also indicates that her palpitations are much better.  For now continue current dose of beta-blocker.

## 2019-10-23 NOTE — Assessment & Plan Note (Signed)
Still concerned about the episode of chest pain she had and the fact she still having some intermittent symptoms along with some exertional dyspnea.  1 last test remaining is the coronary CT angiogram.  Thankfully, her echocardiogram looks pretty normal.\  Continue to await results of coronary CTA. With the possibility being coronary disease, she is on statin, beta-blocker ARB and calcium channel blocker.  If her blood pressure is elevated, would potentially consider increasing either beta-blocker or amlodipine, however with heart rate of 64, amlodipine likely best. Not on aspirin because she is on apixaban for PE.

## 2019-10-25 ENCOUNTER — Ambulatory Visit: Payer: Medicare Other | Attending: Internal Medicine

## 2019-10-25 DIAGNOSIS — Z23 Encounter for immunization: Secondary | ICD-10-CM

## 2019-10-25 NOTE — Progress Notes (Signed)
   Covid-19 Vaccination Clinic  Name:  Erin Coffey    MRN: [id number] DOB: 09/14/1945  10/25/2019  Ms. Erin Coffey was observed post Covid-19 immunization for 15 minutes without incident. She was provided with Vaccine Information Sheet and instruction to access the V-Safe system.   Ms. Erin Coffey was instructed to call 911 with any severe reactions post vaccine: [name] Difficulty breathing  . Swelling of face and throat  . A fast heartbeat  . A bad rash all over body  . Dizziness and weakness   Immunizations Administered    Name Date Dose VIS Date Route   Pfizer COVID-19 Vaccine 10/25/2019 10:36 AM 0.3 mL 07/22/2019 Intramuscular   Manufacturer: [place]   Lot: [id number]   [place]: [id number]

## 2019-10-26 ENCOUNTER — Telehealth (HOSPITAL_COMMUNITY): Payer: Self-pay | Admitting: Emergency Medicine

## 2019-10-26 NOTE — Telephone Encounter (Signed)
Reaching out to patient to offer assistance regarding upcoming cardiac imaging study; pt verbalizes understanding of appt date/time, parking situation and where to check in, pre-test NPO status and medications ordered, and verified current allergies; name and call back number provided for further questions should they arise Anniya Whiters RN Navigator Cardiac Imaging [facility] [phone] office [phone] cell 

## 2019-10-27 ENCOUNTER — Ambulatory Visit (HOSPITAL_COMMUNITY)
Admission: RE | Admit: 2019-10-27 | Discharge: 2019-10-27 | Disposition: A | Discharge status: Home / Self Care | Payer: Medicare Other | Source: Ambulatory Visit | Attending: Cardiology | Admitting: Cardiology

## 2019-10-27 ENCOUNTER — Other Ambulatory Visit: Payer: Self-pay

## 2019-10-27 DIAGNOSIS — R072 Precordial pain: Secondary | ICD-10-CM | POA: Insufficient documentation

## 2019-10-27 MED ORDER — NITROGLYCERIN 0.4 MG SL SUBL
0.8000 mg | SUBLINGUAL_TABLET | SUBLINGUAL | Status: DC | PRN
  Administered 2019-10-27: 0.8 mg via SUBLINGUAL

## 2019-10-27 MED ORDER — NITROGLYCERIN 0.4 MG SL SUBL
SUBLINGUAL_TABLET | SUBLINGUAL | Status: AC
  Filled 2019-10-27: qty 1

## 2019-10-27 MED ORDER — IOHEXOL 350 MG/ML SOLN
100.0000 mL | Freq: Once | INTRAVENOUS | Status: AC | PRN
  Administered 2019-10-27: 100 mL via INTRAVENOUS

## 2019-11-24 ENCOUNTER — Telehealth: Payer: Self-pay | Admitting: *Deleted

## 2019-11-24 ENCOUNTER — Encounter: Payer: Self-pay | Admitting: Cardiology

## 2019-11-24 ENCOUNTER — Telehealth (INDEPENDENT_AMBULATORY_CARE_PROVIDER_SITE_OTHER): Payer: Medicare Other | Admitting: Cardiology

## 2019-11-24 DIAGNOSIS — R002 Palpitations: Secondary | ICD-10-CM

## 2019-11-24 DIAGNOSIS — I10 Essential (primary) hypertension: Secondary | ICD-10-CM

## 2019-11-24 DIAGNOSIS — I2510 Atherosclerotic heart disease of native coronary artery without angina pectoris: Secondary | ICD-10-CM

## 2019-11-24 DIAGNOSIS — E1169 Type 2 diabetes mellitus with other specified complication: Secondary | ICD-10-CM

## 2019-11-24 DIAGNOSIS — E785 Hyperlipidemia, unspecified: Secondary | ICD-10-CM

## 2019-11-24 DIAGNOSIS — I25118 Atherosclerotic heart disease of native coronary artery with other forms of angina pectoris: Secondary | ICD-10-CM | POA: Diagnosis not present

## 2019-11-24 DIAGNOSIS — R0602 Shortness of breath: Secondary | ICD-10-CM

## 2019-11-24 DIAGNOSIS — I208 Other forms of angina pectoris: Secondary | ICD-10-CM

## 2019-11-24 DIAGNOSIS — I2089 Other forms of angina pectoris: Secondary | ICD-10-CM

## 2019-11-24 NOTE — Telephone Encounter (Signed)
RN spoke to patient. Instruction were given  from today's virtual visit 11/24/2019 .  AVS SUMMARY has been mailed to patient.   Recall -12 month - April 2022   Patient verbalized understanding

## 2019-11-24 NOTE — Patient Instructions (Addendum)
Medication Instructions:  No change *If you need a refill on your cardiac medications before your next appointment, please call your pharmacy*   Lab Work: N/a     Testing/Procedures: n/a   Follow-Up: At [facility], you and your health needs are our priority.  As part of our continuing mission to provide you with exceptional heart care, we have created designated Provider Care Teams.  These Care Teams include your primary Cardiologist (physician) and Advanced Practice Providers (APPs -  Physician Assistants and Nurse Practitioners) who all work together to provide you with the care you need, when you need it.  We recommend signing up for the patient portal called "MyChart".  Sign up information is provided on this After Visit Summary.  MyChart is used to connect with patients for Virtual Visits (Telemedicine).  Patients are able to view lab/test results, encounter notes, upcoming appointments, etc.  Non-urgent messages can be sent to your provider as well.   To learn more about what you can do with MyChart, go to [email or web address].    Your next appointment:   1 year(s)  The format for your next appointment:   In Person  Provider:   Glenetta Hew, MD   Other Instructions Keep exercising. All of your Heart studies have been quite reassuring.  Glenetta Hew, MD

## 2019-11-24 NOTE — Progress Notes (Signed)
Virtual Visit via Telephone Note   This visit type was conducted due to national recommendations for restrictions regarding the COVID-19 Pandemic (e.g. social distancing) in an effort to limit this patient's exposure and mitigate transmission in our community.  Due to her co-morbid illnesses, this patient is at least at moderate risk for complications without adequate follow up.  This format is felt to be most appropriate for this patient at this time.  The patient did not have access to video technology/had technical difficulties with video requiring transitioning to audio format only (telephone).  All issues noted in this document were discussed and addressed.  No physical exam could be performed with this format.  Please refer to the patient's chart for her  consent to telehealth for [facility].   Patient has given verbal permission to conduct this visit via virtual appointment and to bill insurance 11/26/2019 10:52 PM     Evaluation Performed:  Follow-up visit  Date:  11/26/2019   ID:  Erin, Coffey [birth year], MRN [id number]  Patient Location: Home Provider Location: Other:  Hospital office  PCP:  Bonnita Hollow, MD  Cardiologist:  No primary care provider on file. Glenetta Hew, MD Electrophysiologist:  None   Chief Complaint:   Chief Complaint  Patient presents with  . 1 month follow up     f/u CCTA    History of Present Illness:    Erin Coffey is a 74 y.o. female with PMH notable for DVT having Pe, DM2 and CKD 3 who presents via audio/video conferencing for a telehealth visit today to discuss results of coronary CT angiogram for evaluation of chest pain.Erin Coffey was last seen October 21, 2019 as follow-up from January visit with chest pain, palpitation dizziness.  Relatively reassuring event monitor and echocardiogram.  At this time is noticing more concerning chest discomfort so we evaluate with a coronary CTA.  Hospitalizations:   . None   Recent - Interim CV studies:    The following studies were reviewed today: . October 30, 2019: Coronary CTA: Coronary calcium score 46.Normal coronary origin.  Left dominance with tortuous coronary arteries.  Nondominant RCA - prox < 25% followed by ectasia (dilated to 5.5 mm) mixed plaque in distal vessel.  LM- normal . LAD: minimal mixed plaque in ostial & proximal (<25%) with brief IM bridging in mLAD. Mild mid-distal (25-49%), RI - prox <25%, Large LCx- minimal (<25%) mid-distal.   Inerval History   Erin Coffey is being seen in follow-up via telemedicine to discuss results of coronary CT angiogram.  Actually, since her last visit she has not noticed any significant palpitations and her chest pain is notably better.  She walks 3 days a week now for least a mile.  Is limited more by musculoskeletal pain in the neck or have to stop and rest, but no significant dyspnea or chest pain.  Cardiovascular ROS: positive for - Notably improved chest discomfort as well as palpitations.  Some mild dyspnea on exertion, but not significant with current exercise. negative for - edema, irregular heartbeat, orthopnea, paroxysmal nocturnal dyspnea, rapid heart rate, shortness of breath or Syncope/near syncope, TIA/amaurosis fugax, claudication  ROS:  Please see the history of present illness.    The patient does not have symptoms concerning for COVID-19 infection (fever, chills, cough, or new shortness of breath).   -Improved shortness of breath, urinary frequency, back and joint pain.  Occasional dizziness and tingling.  Mild insomnia with nervousness.  Otherwise review of symptoms negative if not noted above.  The patient is practicing social distancing.  ++ COVID-19 vaccine  Past Medical History:  Diagnosis Date  . Allergy   . Anemia, iron deficiency   . ANXIETY, SITUATIONAL 04/06/2009   Qualifier: Diagnosis of  By: [name]  MD, Colletta [place]    . Arthritis   . Back pain, thoracic 12/20/2012   . Carpal tunnel syndrome 12/07/2014  . Cholelithiasis 06/25/2012  . Chronic kidney disease   . DM type 2 goal A1C below 7.5 06/05/2008   Qualifier: Diagnosis of  By: [name]  MD, Colletta Maryland    . GASTROESOPHAGEAL REFLUX, NO ESOPHAGITIS 10/08/2006   Qualifier: Diagnosis of  By: [name]    . Goiter, unspecified 10/21/2007   Qualifier: Diagnosis of  By: [name] MD, [name]    . History of DVT (deep vein thrombosis) 05/05/2013   on Eliquis  . HYPERLIPIDEMIA 08/08/2008   Qualifier: Diagnosis of  By: [name]  MD, Colletta Maryland    . HYPERTENSION, BENIGN ESSENTIAL 11/27/2006   Qualifier: Diagnosis of  By: [name] MD, [place]    . Mild coronary artery disease 10/2019   Coronary CT Angiogram: Coronary calcium score 46.Normal coronary origin.  Left dominance with tortuous coronary arteries.  Nondominant RCA - prox < 25% followed by ectasia (dilated to 5.5 mm) mixed plaque in distal vessel.  LM- normal . LAD: minimal mixed plaque in ostial & proximal (<25%) with brief IM bridging in mLAD. Mild mid-distal (25-49%), RI - prox <25%, Large LCx- minimal (<25%) mid-dista  . Personal history of colonic polyps 10/24/2009   Qualifier: Diagnosis of  By: [name] MD, Sandy Salaam   . Pica 08/08/2009   Qualifier: Diagnosis of  By: [name], [name]    . Polymyalgia rheumatica ([place]) 10/02/2009   Qualifier: Diagnosis of  By: [name] MD, Apolonio Schneiders    . Pulmonary embolism ([place]) - On long Term anticoagulation (Z79.01) 04/28/2011   Converted from warfarin to Eliquis; on long-term anticoagulation.  . Thyroid disease   . Trigger ring finger of left hand 11/02/2014   Past Surgical History:  Procedure Laterality Date  . ABDOMINAL HYSTERECTOMY    . CARDIAC CATHETERIZATION  04/29/11   (Dr. Lia Foyer [facility]): LM - normal. LAD with 1 major Diag - normal, bifurcating Ramus - normal, Large (6-7 mm) Dominant LCx with 1 OM, 2 LPL branches & LPDA - non significant disease; small non-dominant RCA.    [name] CARDIAC EVENT MONITOR  09/2019   Mostly sinus  rhythm, average rate 60 bpm.  Range 45 to 190 bpm.  No critical events indicating any arrhythmias or pauses.  For patient responses noted PVCs.  Less than 1% PVCs noted.   [name] CARPAL TUNNEL RELEASE Right 1996  . KNEE ARTHROSCOPY  Bilateral  . TRANSTHORACIC ECHOCARDIOGRAM  09/2019   Normal EF - 60 to 65%.  Mild LVH.  GR 1 DD.  Normal RV.  Trivial MR.  Normal IVC.  Normal PA pressures.  . TUBAL LIGATION       Current Meds  Medication Sig  . acetaminophen (TYLENOL) 650 MG CR tablet Take 1,300 mg by mouth every 8 (eight) hours as needed for pain.  [name] alendronate (FOSAMAX) 70 MG tablet Take 1 tablet (70 mg total) by mouth every 7 (seven) days. Take with a full glass of water on an empty stomach.  [name] amLODipine (NORVASC) 5 MG tablet Take 1 tablet (5 mg total) by mouth at bedtime.  [name] apixaban (ELIQUIS) 5 MG TABS tablet Take  1 tablet (5 mg total) by mouth 2 (two) times daily. Begin after completing starterpack for eliquis  . atorvastatin (LIPITOR) 40 MG tablet Take 1 tablet (40 mg total) by mouth daily.  . baclofen (LIORESAL) 10 MG tablet Take 1 tablet (10 mg total) by mouth daily as needed for muscle spasms.  . Blood Glucose Monitoring Suppl (ONETOUCH VERIO) w/Device KIT Use as instructed to test sugars once daily.  Dx Code: E11.9  . Calcium Carb-Cholecalciferol (CALCIUM 500+D3) 500-400 MG-UNIT TABS Take 1 tablet by mouth daily.  . diclofenac Sodium (VOLTAREN) 1 % GEL Apply 2 g topically at bedtime. Apply to shoulder  . ELDERBERRY PO Take 2 g by mouth daily at 12 noon.  . fish oil-omega-3 fatty acids 1000 MG capsule Take 1 g by mouth daily.   [name] gabapentin (NEURONTIN) 100 MG capsule Take 100 mg by mouth 2 (two) times daily.  [name] glucose blood (ONETOUCH VERIO) test strip Use as instructed to test sugars once daily.  Dx Code: E11.9  . glucose blood test strip Use as instructed  . iron polysaccharides (FERREX 150) 150 MG capsule Take 1 capsule (150 mg total) by mouth daily.  [name] ketoconazole (NIZORAL) 2 %  cream Apply 1 application topically daily as needed for irritation. Apply to rash area as needed  . Lancet Devices (ONE TOUCH DELICA LANCING DEV) MISC Use as instructed to test sugars once daily.  Dx Code: E11.9  . losartan (COZAAR) 25 MG tablet Take 1 tablet by mouth once daily  . metoprolol tartrate (LOPRESSOR) 25 MG tablet Take 1 tablet by mouth twice daily  . omeprazole (PRILOSEC) 20 MG capsule Take 20 mg by mouth daily.  Glory Rosebush Delica Lancets 54Y MISC Use as instructed to test sugars once daily.  Dx Code: E11.9  . polyethylene glycol powder (GLYCOLAX/MIRALAX) powder Take 17 g by mouth 2 (two) times daily as needed. (Patient taking differently: Take 17 g by mouth 2 (two) times daily as needed (constipation). )  . traMADol (ULTRAM) 50 MG tablet TAKE 1 TABLET BY MOUTH EVERY 6 HOURS AS NEEDED FOR MODERATE PAIN  . triamcinolone cream (KENALOG) 0.1 % Apply 1 application topically daily as needed. Apply to rash     Allergies:   Lisinopril   Social History   Tobacco Use  . Smoking status: Never Smoker  . Smokeless tobacco: Never Used  Substance Use Topics  . Alcohol use: No    Alcohol/week: 0.0 standard drinks  . Drug use: No     Family Hx: The patient's family history includes Cancer (age of onset: 67) in her sister; Cirrhosis in her sister; Diabetes in her brother, mother, sister, and sister; Heart disease in her father, mother, and sister; Hypertension in her brother, father, and mother; Kidney disease in her sister. There is no history of Colon cancer.   Labs/Other Tests and Data Reviewed:    EKG:  No ECG reviewed.  Recent Labs: 03/03/2019: B Natriuretic Peptide 21.1 03/24/2019: ALT 17 09/01/2019: Hemoglobin 11.8; Platelets 216 10/21/2019: BUN 14; Creatinine, Ser 1.22; Potassium 5.0; Sodium 136   Recent Lipid Panel Lab Results  Component Value Date/Time   CHOL 156 03/07/2019 10:47 AM   TRIG 53 03/07/2019 10:47 AM   HDL 75 03/07/2019 10:47 AM   CHOLHDL 2.1 03/07/2019 10:47  AM   CHOLHDL 1.9 12/10/2015 08:50 AM   LDLCALC 70 03/07/2019 10:47 AM   LDLDIRECT 98 02/02/2014 03:01 PM    Wt Readings from Last 3 Encounters:  11/24/19 294 lb (133.4  kg)  10/21/19 298 lb (135.2 kg)  09/12/19 294 lb (133.4 kg)     Objective:    Vital Signs:  BP (!) 119/54   Pulse (!) 54   Temp (!) 96.6 F (35.9 C)   Ht '5\' 9"'$  (1.753 m)   Wt 294 lb (133.4 kg)   SpO2 100%   BMI 43.42 kg/m   VITAL SIGNS:  reviewed pleasant female in no acute distress. A&O x 3.  Normal Mood & Affect Non-labored respirations; no audible wheezing or cough   ASSESSMENT & PLAN:    Problem List Items Addressed This Visit    Hyperlipidemia associated with type 2 diabetes mellitus ([place]) (Chronic)    LDL now down to 70.  This is per my target for her on current medications.  Congratulated her efforts. For now continue current dose of atorvastatin.      HYPERTENSION, BENIGN ESSENTIAL (Chronic)    Blood pressure stable on current meds.  No change  On amlodipine 5 mg, losartan 25 mg and metoprolol tartrate 25 mg twice daily. Not on diuretic.      Palpitations (Chronic)    Stable.  Doing well on beta-blocker.  No changes.  No significant findings on monitor.      Mild coronary artery disease (Chronic)    Really mild CAD noted on coronary CTA. Is on stable dose of Lipitor with goal LDL less than 70.  On Norvasc and beta-blocker.  Does not seem to be having true anginal chest pain. Not on aspirin because of Eliquis.  Seems doing relatively well current medications.  Reassuring results.      Anginal chest pain at rest [facility])    Symptoms seem to be improved.  No significant findings on coronary CTA.  Would suggest that this is probably not anginal symptoms.  With concern for possible spasm, continue atorvastatin. Not on aspirin because of apixaban. Continue statin beta-blocker and ARB.      Shortness of breath    Exertional dyspnea probably related deconditioning and obesity.  Coronary CTA  does not show any evidence of obstructive disease.  Unlikely to be cardiac in nature.         COVID-19 Education: The signs and symptoms of COVID-19 were discussed with the patient and how to seek care for testing (follow up with PCP or arrange E-visit).   The importance of social distancing was discussed today.  Time:   Today, I have spent 20 minutes with the patient with telehealth technology discussing the above problems.  Additional 3 to 4-minute spent with charting   Medication Adjustments/Labs and Tests Ordered: Current medicines are reviewed at length with the patient today.  Concerns regarding medicines are outlined above.   Patient Instructions  Medication Instructions:  No change *If you need a refill on your cardiac medications before your next appointment, please call your pharmacy*   Lab Work: N/a     Testing/Procedures: n/a   Follow-Up: At [facility], you and your health needs are our priority.  As part of our continuing mission to provide you with exceptional heart care, we have created designated Provider Care Teams.  These Care Teams include your primary Cardiologist (physician) and Advanced Practice Providers (APPs -  Physician Assistants and Nurse Practitioners) who all work together to provide you with the care you need, when you need it.  We recommend signing up for the patient portal called "MyChart".  Sign up information is provided on this After Visit Summary.  MyChart is used to  connect with patients for Virtual Visits (Telemedicine).  Patients are able to view lab/test results, encounter notes, upcoming appointments, etc.  Non-urgent messages can be sent to your provider as well.   To learn more about what you can do with MyChart, go to [email or web address].    Your next appointment:   1 year(s)  The format for your next appointment:   In Person  Provider:   Glenetta Hew, MD   Other Instructions Keep exercising. All of your Heart  studies have been quite reassuring.  Glenetta Hew, MD      Signed, Glenetta Hew, MD  11/26/2019 10:52 PM    [place]

## 2019-11-24 NOTE — Telephone Encounter (Signed)
  Patient Consent for Virtual Visit         Erin Coffey has provided verbal consent on 11/24/2019 for a virtual visit (video or telephone).   CONSENT FOR VIRTUAL VISIT FOR:  Erin Coffey  By participating in this virtual visit I agree to the following:  I hereby voluntarily request, consent and authorize [facility] and its employed or contracted physicians, physician assistants, nurse practitioners or other licensed health care professionals (the Practitioner), to provide me with telemedicine health care services (the "Services") as deemed necessary by the treating Practitioner. I acknowledge and consent to receive the Services by the Practitioner via telemedicine. I understand that the telemedicine visit will involve communicating with the Practitioner through live audiovisual communication technology and the disclosure of certain medical information by electronic transmission. I acknowledge that I have been given the opportunity to request an in-person assessment or other available alternative prior to the telemedicine visit and am voluntarily participating in the telemedicine visit.  I understand that I have the right to withhold or withdraw my consent to the use of telemedicine in the course of my care at any time, without affecting my right to future care or treatment, and that the Practitioner or I may terminate the telemedicine visit at any time. I understand that I have the right to inspect all information obtained and/or recorded in the course of the telemedicine visit and may receive copies of available information for a reasonable fee.  I understand that some of the potential risks of receiving the Services via telemedicine include:  [name] Delay or interruption in medical evaluation due to technological equipment failure or disruption; . Information transmitted may not be sufficient (e.g. poor resolution of images) to allow for appropriate medical decision making by the  Practitioner; and/or  . In rare instances, security protocols could fail, causing a breach of personal health information.  Furthermore, I acknowledge that it is my responsibility to provide information about my medical history, conditions and care that is complete and accurate to the best of my ability. I acknowledge that Practitioner's advice, recommendations, and/or decision may be based on factors not within their control, such as incomplete or inaccurate data provided by me or distortions of diagnostic images or specimens that may result from electronic transmissions. I understand that the practice of medicine is not an exact science and that Practitioner makes no warranties or guarantees regarding treatment outcomes. I acknowledge that a copy of this consent can be made available to me via my patient portal ([facility]), or I can request a printed copy by calling the office of [place] [facility].    I understand that my insurance will be billed for this visit.   I have read or had this consent read to me. . I understand the contents of this consent, which adequately explains the benefits and risks of the Services being provided via telemedicine.  . I have been provided ample opportunity to ask questions regarding this consent and the Services and have had my questions answered to my satisfaction. . I give my informed consent for the services to be provided through the use of telemedicine in my medical care

## 2019-11-26 ENCOUNTER — Encounter: Payer: Self-pay | Admitting: Cardiology

## 2019-11-26 NOTE — Assessment & Plan Note (Signed)
Exertional dyspnea probably related deconditioning and obesity.  Coronary CTA does not show any evidence of obstructive disease.  Unlikely to be cardiac in nature.

## 2019-11-26 NOTE — Assessment & Plan Note (Signed)
Really mild CAD noted on coronary CTA. Is on stable dose of Lipitor with goal LDL less than 70.  On Norvasc and beta-blocker.  Does not seem to be having true anginal chest pain. Not on aspirin because of Eliquis.  Seems doing relatively well current medications.  Reassuring results.

## 2019-11-26 NOTE — Assessment & Plan Note (Addendum)
Blood pressure stable on current meds.  No change  On amlodipine 5 mg, losartan 25 mg and metoprolol tartrate 25 mg twice daily. Not on diuretic.

## 2019-11-26 NOTE — Assessment & Plan Note (Signed)
Symptoms seem to be improved.  No significant findings on coronary CTA.  Would suggest that this is probably not anginal symptoms.  With concern for possible spasm, continue atorvastatin. Not on aspirin because of apixaban. Continue statin beta-blocker and ARB.

## 2019-11-26 NOTE — Assessment & Plan Note (Signed)
Stable.  Doing well on beta-blocker.  No changes.  No significant findings on monitor.

## 2019-11-26 NOTE — Assessment & Plan Note (Signed)
LDL now down to 70.  This is per my target for her on current medications.  Congratulated her efforts. For now continue current dose of atorvastatin.

## 2019-12-09 ENCOUNTER — Ambulatory Visit: Payer: Medicare Other | Admitting: Podiatry

## 2019-12-21 ENCOUNTER — Other Ambulatory Visit: Payer: Self-pay | Admitting: Neurosurgery

## 2019-12-21 DIAGNOSIS — S32010A Wedge compression fracture of first lumbar vertebra, initial encounter for closed fracture: Secondary | ICD-10-CM

## 2020-01-05 ENCOUNTER — Other Ambulatory Visit: Payer: Self-pay

## 2020-01-05 ENCOUNTER — Ambulatory Visit (INDEPENDENT_AMBULATORY_CARE_PROVIDER_SITE_OTHER): Payer: Medicare Other

## 2020-01-05 VITALS — BP 127/59 | HR 54 | Temp 97.5°F | Wt 294.0 lb

## 2020-01-05 DIAGNOSIS — Z0000 Encounter for general adult medical examination without abnormal findings: Secondary | ICD-10-CM | POA: Diagnosis not present

## 2020-01-05 NOTE — Progress Notes (Addendum)
Subjective:   Erin Coffey is a 74 y.o. female who presents for Medicare Annual (Subsequent) preventive examination.  The patient consented to a virtual visit.  Review of Systems: Defer to PCP.  Cardiac Risk Factors include: advanced age (>28mn, >>70women);diabetes mellitus;hypertension  Objective:   Vitals: BP (!) 127/59   Pulse (!) 54   Temp (!) 97.5 F (36.4 C) (Oral)   Wt 294 lb (133.4 kg)   BMI 43.42 kg/m   Body mass index is 43.42 kg/m.  Advanced Directives 01/05/2020 09/12/2019 08/30/2019 05/24/2019 03/24/2019 03/18/2019 03/03/2019  Does Patient Have a Medical Advance Directive? No No Yes No No No No  Type of Advance Directive - - HLewistonin Chart? - - No - copy requested, Physician notified - - - -  Would patient like information on creating a medical advance directive? Yes (MAU/Ambulatory/Procedural Areas - Information given) No - Patient declined No - Patient declined No - Patient declined No - Patient declined No - Patient declined No - Patient declined   Tobacco Social History   Tobacco Use  Smoking Status Never Smoker  Smokeless Tobacco Never Used     Clinical Intake:  Pre-visit preparation completed: Yes  Pain Score: 0-No pain  How often do you need to have someone help you when you read instructions, pamphlets, or other written materials from your doctor or pharmacy?: 2 - Rarely What is the last grade level you completed in school?: 10th grade  Past Medical History:  Diagnosis Date  . Allergy   . Anemia, iron deficiency   . ANXIETY, SITUATIONAL 04/06/2009   Qualifier: Diagnosis of  By: ACarlena Sax MD, SColletta [place]   . Arthritis   . Back pain, thoracic 12/20/2012  . Carpal tunnel syndrome 12/07/2014  . Cholelithiasis 06/25/2012  . Chronic kidney disease   . DM type 2 goal A1C below 7.5 06/05/2008   Qualifier: Diagnosis of  By: ACarlena Sax MD, SColletta Maryland   . GASTROESOPHAGEAL REFLUX, NO ESOPHAGITIS  10/08/2006   Qualifier: Diagnosis of  By: OSamara Snide   . Goiter, unspecified 10/21/2007   Qualifier: Diagnosis of  By: GHoy MornMD, [name]    . History of DVT (deep vein thrombosis) 05/05/2013   on Eliquis  . HYPERLIPIDEMIA 08/08/2008   Qualifier: Diagnosis of  By: ACarlena Sax MD, SColletta [place]   . HYPERTENSION, BENIGN ESSENTIAL 11/27/2006   Qualifier: Diagnosis of  By: GHoy MornMD, HNiles   . Mild coronary artery disease 10/2019   Coronary CT Angiogram: Coronary calcium score 46.Normal coronary origin.  Left dominance with tortuous coronary arteries.  Nondominant RCA - prox < 25% followed by ectasia (dilated to 5.5 mm) mixed plaque in distal vessel.  LM- normal . LAD: minimal mixed plaque in ostial & proximal (<25%) with brief IM bridging in mLAD. Mild mid-distal (25-49%), RI - prox <25%, Large LCx- minimal (<25%) mid-dista  . Mild coronary artery disease   . Personal history of colonic polyps 10/24/2009   Qualifier: Diagnosis of  By: KDeatra InaMD, RSandy Salaam  . Pica 08/08/2009   Qualifier: Diagnosis of  By: JMartinique [name]    . Polymyalgia rheumatica ([place] 10/02/2009   Qualifier: Diagnosis of  By: SCharlett BlakeMD, RApolonio Schneiders   . Pulmonary embolism ([place] - On long Term anticoagulation (Z79.01) 04/28/2011   Converted from warfarin to Eliquis; on long-term anticoagulation.  . Thyroid disease   . Trigger ring finger  of left hand 11/02/2014   Past Surgical History:  Procedure Laterality Date  . ABDOMINAL HYSTERECTOMY    . CARDIAC CATHETERIZATION  04/29/11   (Dr. Lia Foyer [facility]): LM - normal. LAD with 1 major Diag - normal, bifurcating Ramus - normal, Large (6-7 mm) Dominant LCx with 1 OM, 2 LPL branches & LPDA - non significant disease; small non-dominant RCA.    [name] CARDIAC EVENT MONITOR  09/2019   Mostly sinus rhythm, average rate 60 bpm.  Range 45 to 190 bpm.  No critical events indicating any arrhythmias or pauses.  For patient responses noted PVCs.  Less than 1% PVCs noted.   [name] CARPAL TUNNEL RELEASE Right  1996  . KNEE ARTHROSCOPY  Bilateral  . TRANSTHORACIC ECHOCARDIOGRAM  09/2019   Normal EF - 60 to 65%.  Mild LVH.  GR 1 DD.  Normal RV.  Trivial MR.  Normal IVC.  Normal PA pressures.  . TUBAL LIGATION     Family History  Problem Relation Age of Onset  . Diabetes Mother   . Heart disease Mother   . Hypertension Mother   . Heart disease Father   . Hypertension Father   . Diabetes Sister   . Heart disease Sister   . Hypertension Brother   . Cancer Sister 15       lukemia  . Cirrhosis Sister        alcohol  . Kidney disease Sister   . Diabetes Sister   . Diabetes Brother   . Colon cancer Neg Hx    Social History   Socioeconomic History  . Marital status: Widowed    Spouse name: Not on file  . Number of children: 3  . Years of education: 10  . Highest education level: 10th grade  Occupational History  . Occupation: Retired- Airline pilot: [employer]  Tobacco Use  . Smoking status: Never Smoker  . Smokeless tobacco: Never Used  Substance and Sexual Activity  . Alcohol use: No    Alcohol/week: 0.0 standard drinks  . Drug use: No  . Sexual activity: Not Currently  Other Topics Concern  . Not on file  Social History Narrative   Patient lives alone in [place], she is a widow.   Patient has 3 children. Two daughters live locally, one son in [place].    Patient is very active in her church and volunteer work to stay busy.    Patient enjoys cooking, spending time with family, and crossword puzzles.       Social Determinants of Health   Financial Resource Strain: Low Risk   . Difficulty of Paying Living Expenses: Not very hard  Food Insecurity: No Food Insecurity  . Worried About Charity fundraiser in the Last Year: Never true  . Ran Out of Food in the Last Year: Never true  Transportation Needs: No Transportation Needs  . Lack of Transportation (Medical): No  . Lack of Transportation (Non-Medical): No  Physical Activity: Sufficiently Active  . Days of  Exercise per Week: 3 days  . Minutes of Exercise per Session: 60 min  Stress: No Stress Concern Present  . Feeling of Stress : Not at all  Social Connections: Slightly Isolated  . Frequency of Communication with Friends and Family: More than three times a week  . Frequency of Social Gatherings with Friends and Family: More than three times a week  . Attends Religious Services: More than 4 times per year  . Active Member  of Clubs or Organizations: Yes  . Attends Archivist Meetings: More than 4 times per year  . Marital Status: Widowed    Outpatient Encounter Medications as of 01/05/2020  Medication Sig  . acetaminophen (TYLENOL) 650 MG CR tablet Take 1,300 mg by mouth every 8 (eight) hours as needed for pain.  [name] alendronate (FOSAMAX) 70 MG tablet Take 1 tablet (70 mg total) by mouth every 7 (seven) days. Take with a full glass of water on an empty stomach.  [name] amLODipine (NORVASC) 5 MG tablet Take 1 tablet (5 mg total) by mouth at bedtime.  [name] apixaban (ELIQUIS) 5 MG TABS tablet Take 1 tablet (5 mg total) by mouth 2 (two) times daily. Begin after completing starterpack for eliquis  . atorvastatin (LIPITOR) 40 MG tablet Take 1 tablet (40 mg total) by mouth daily.  . baclofen (LIORESAL) 10 MG tablet Take 1 tablet (10 mg total) by mouth daily as needed for muscle spasms.  . Blood Glucose Monitoring Suppl (ONETOUCH VERIO) w/Device KIT Use as instructed to test sugars once daily.  Dx Code: E11.9  . Calcium Carb-Cholecalciferol (CALCIUM 500+D3) 500-400 MG-UNIT TABS Take 1 tablet by mouth daily.  . diclofenac Sodium (VOLTAREN) 1 % GEL Apply 2 g topically at bedtime. Apply to shoulder  . ELDERBERRY PO Take 2 g by mouth daily at 12 noon.  . fish oil-omega-3 fatty acids 1000 MG capsule Take 1 g by mouth daily.   [name] gabapentin (NEURONTIN) 100 MG capsule Take 100 mg by mouth 2 (two) times daily.  [name] glucose blood (ONETOUCH VERIO) test strip Use as instructed to test sugars once daily.  Dx Code:  E11.9  . glucose blood test strip Use as instructed  . iron polysaccharides (FERREX 150) 150 MG capsule Take 1 capsule (150 mg total) by mouth daily.  [name] ketoconazole (NIZORAL) 2 % cream Apply 1 application topically daily as needed for irritation. Apply to rash area as needed  . Lancet Devices (ONE TOUCH DELICA LANCING DEV) MISC Use as instructed to test sugars once daily.  Dx Code: E11.9  . losartan (COZAAR) 25 MG tablet Take 1 tablet by mouth once daily  . metoprolol tartrate (LOPRESSOR) 25 MG tablet Take 1 tablet by mouth twice daily  . omeprazole (PRILOSEC) 20 MG capsule Take 20 mg by mouth daily.  Glory Rosebush Delica Lancets 31V MISC Use as instructed to test sugars once daily.  Dx Code: E11.9  . polyethylene glycol powder (GLYCOLAX/MIRALAX) powder Take 17 g by mouth 2 (two) times daily as needed. (Patient taking differently: Take 17 g by mouth 2 (two) times daily as needed (constipation). )  . traMADol (ULTRAM) 50 MG tablet TAKE 1 TABLET BY MOUTH EVERY 6 HOURS AS NEEDED FOR MODERATE PAIN  . triamcinolone cream (KENALOG) 0.1 % Apply 1 application topically daily as needed. Apply to rash   No facility-administered encounter medications on file as of 01/05/2020.   Activities of Daily Living In your present state of health, do you have any difficulty performing the following activities: 01/05/2020 03/03/2019  Hearing? N N  Vision? N N  Difficulty concentrating or making decisions? N N  Walking or climbing stairs? N Y  Dressing or bathing? N N  Doing errands, shopping? N N  Preparing Food and eating ? N -  Using the Toilet? N -  In the past six months, have you accidently leaked urine? N -  Do you have problems with loss of bowel control? N -  Managing your  Medications? N -  Managing your Finances? N -  Housekeeping or managing your Housekeeping? N -  Some recent data might be hidden   Patient Care Team: Bonnita Hollow, MD as PCP - General (Family Medicine) Marica [place], [place]  (Optometry) Jamal Maes, MD (Nephrology) Karie Chimera, MD as Consulting Physician (Neurosurgery)    Assessment:   This is a routine wellness examination for Lennyn.  Exercise Activities and Dietary recommendations Current Exercise Habits: Home exercise routine, Type of exercise: walking, Time (Minutes): 30, Frequency (Times/Week): 5, Weekly Exercise (Minutes/Week): 150, Intensity: Mild, Exercise limited by: orthopedic condition(s)  Goals    . HEMOGLOBIN A1C < 7      Fall Risk Fall Risk  01/05/2020 09/12/2019 05/24/2019 03/24/2019 03/03/2019  Falls in the past year? 0 0 0 0 0  Number falls in past yr: - - - - 0  Injury with Fall? - - - - -  Comment - - - - -  Risk Factor Category  - - - - -  Risk for fall due to : - - - - -  Follow up - - - - -   Is the patient's home free of loose throw rugs in walkways, pet beds, electrical cords, etc?   yes      Grab bars in the bathroom? no      Handrails on the stairs?   yes      Adequate lighting?   yes  Patient rating of health (0-10) scale: 8   Depression Screen PHQ 2/9 Scores 01/05/2020 09/12/2019 05/24/2019 03/24/2019  PHQ - 2 Score 0 2 0 0    Cognitive Function MMSE - Mini Mental State Exam 10/27/2013  Orientation to time 5  Orientation to Place 5  Registration 3  Attention/ Calculation 5  Recall 3  Language- name 2 objects 2  Language- repeat 1  Language- follow 3 step command 3  Language- read & follow direction 1  Write a sentence 1  Copy design 1  Total score 30   Immunization History  Administered Date(s) Administered  . Influenza Split 05/15/2011, 04/29/2012  . Influenza Whole 06/05/2008, 08/08/2009  . Influenza,inj,Quad PF,6+ Mos 05/05/2013, 06/08/2014, 04/19/2015, 04/29/2016, 08/21/2016, 04/09/2017, 05/31/2018, 05/05/2019  . PFIZER SARS-COV-2 Vaccination 10/01/2019, 10/25/2019  . Pneumococcal Conjugate-13 05/05/2013  . Pneumococcal Polysaccharide-23 08/24/2014  . Td 02/08/2002  . Tdap 01/20/2013  . Zoster  12/20/2012   Screening Tests Health Maintenance  Topic Date Due  . OPHTHALMOLOGY EXAM  01/01/2019  . MAMMOGRAM  11/24/2019  . INFLUENZA VACCINE  03/11/2020  . HEMOGLOBIN A1C  03/11/2020  . FOOT EXAM  09/08/2020  . TETANUS/TDAP  01/21/2023  . COLONOSCOPY  12/16/2025  . DEXA SCAN  Completed  . COVID-19 Vaccine  Completed  . Hepatitis C Screening  Completed  . PNA vac Low Risk Adult  Completed   Cancer Screenings: Lung: Low Dose CT Chest recommended if Age 69-80 years, 30 pack-year currently smoking OR have quit w/in 15years. Patient does not qualify. Breast:  Up to date on Mammogram? No  Apt scheduled for 01/12/2011 Up to date of Bone Density/Dexa? Yes Colorectal: UTD  Additional Screenings: Hepatitis C Screening: Completed   Plan:  Keep up the good work walking daily! Fill out an advance directive packet. Mammogram apt 01/12/2020. Eye apt scheduled for June. PCP apt 02/03/2020 _0 :50am.  I have personally reviewed and noted the following in the patient's chart:   . Medical and social history . Use of alcohol, tobacco or illicit drugs  .  Current medications and supplements . Functional ability and status . Nutritional status . Physical activity . Advanced directives . List of other physicians . Hospitalizations, surgeries, and ER visits in previous 12 months . Vitals . Screenings to include cognitive, depression, and falls . Referrals and appointments  In addition, I have reviewed and discussed with patient certain preventive protocols, quality metrics, and best practice recommendations. A written personalized care plan for preventive services as well as general preventive health recommendations were provided to patient.  This visit was conducted virtually in the setting of the [place] pandemic.    Dorna Bloom, [place]  01/05/2020    I have reviewed this visit and agree with the documentation.

## 2020-01-05 NOTE — Patient Instructions (Signed)
You spoke to Dorna Bloom, [place] over the phone for your annual wellness visit.  We discussed goals: Goals    . HEMOGLOBIN A1C < 7      We also discussed recommended health maintenance. As discussed, you are pretty much up to date with everything! You have a mammogram apt scheduled for 01/12/2020 and an eye apt scheduled for June as well.   Health Maintenance  Topic Date Due  . OPHTHALMOLOGY EXAM  01/01/2019  . MAMMOGRAM  11/24/2019  . INFLUENZA VACCINE  03/11/2020  . HEMOGLOBIN A1C  03/11/2020  . FOOT EXAM  09/08/2020  . TETANUS/TDAP  01/21/2023  . COLONOSCOPY  12/16/2025  . DEXA SCAN  Completed  . COVID-19 Vaccine  Completed  . Hepatitis C Screening  Completed  . PNA vac Low Risk Adult  Completed   Keep up the good work walking daily! Fill out an advance directive packet. Mammogram apt 01/12/2020. Eye apt scheduled for June. PCP apt 02/03/2020 '@8'$ :50am.  Preventive Care 65 Years and Older, Female Preventive care refers to lifestyle choices and visits with your health care provider that can promote health and wellness. This includes:  A yearly physical exam. This is also called an annual well check.  Regular dental and eye exams.  Immunizations.  Screening for certain conditions.  Healthy lifestyle choices, such as diet and exercise. What can I expect for my preventive care visit? Physical exam Your health care provider will check:  Height and weight. These may be used to calculate body mass index (BMI), which is a measurement that tells if you are at a healthy weight.  Heart rate and blood pressure.  Your skin for abnormal spots. Counseling Your health care provider may ask you questions about:  Alcohol, tobacco, and drug use.  Emotional well-being.  Home and relationship well-being.  Sexual activity.  Eating habits.  History of falls.  Memory and ability to understand (cognition).  Work and work Statistician.  Pregnancy and menstrual history. What  immunizations do I need?  Influenza (flu) vaccine  This is recommended every year. Tetanus, diphtheria, and pertussis (Tdap) vaccine  You may need a Td booster every 10 years. Varicella (chickenpox) vaccine  You may need this vaccine if you have not already been vaccinated. Zoster (shingles) vaccine  You may need this after age 68. Pneumococcal conjugate (PCV13) vaccine  One dose is recommended after age 62. Pneumococcal polysaccharide (PPSV23) vaccine  One dose is recommended after age 81. Measles, mumps, and rubella (MMR) vaccine  You may need at least one dose of MMR if you were born in 1957 or later. You may also need a second dose. Meningococcal conjugate (MenACWY) vaccine  You may need this if you have certain conditions. Hepatitis A vaccine  You may need this if you have certain conditions or if you travel or work in places where you may be exposed to hepatitis A. Hepatitis B vaccine  You may need this if you have certain conditions or if you travel or work in places where you may be exposed to hepatitis B. Haemophilus influenzae type b (Hib) vaccine  You may need this if you have certain conditions. You may receive vaccines as individual doses or as more than one vaccine together in one shot (combination vaccines). Talk with your health care provider about the risks and benefits of combination vaccines. What tests do I need? Blood tests  Lipid and cholesterol levels. These may be checked every 5 years, or more frequently depending on your  overall health.  Hepatitis C test.  Hepatitis B test. Screening  Lung cancer screening. You may have this screening every year starting at age 43 if you have a 30-pack-year history of smoking and currently smoke or have quit within the past 15 years.  Colorectal cancer screening. All adults should have this screening starting at age 38 and continuing until age 20. Your health care provider may recommend screening at age 28 if  you are at increased risk. You will have tests every 1-10 years, depending on your results and the type of screening test.  Diabetes screening. This is done by checking your blood sugar (glucose) after you have not eaten for a while (fasting). You may have this done every 1-3 years.  Mammogram. This may be done every 1-2 years. Talk with your health care provider about how often you should have regular mammograms.  BRCA-related cancer screening. This may be done if you have a family history of breast, ovarian, tubal, or peritoneal cancers. Other tests  Sexually transmitted disease (STD) testing.  Bone density scan. This is done to screen for osteoporosis. You may have this done starting at age 65. Follow these instructions at home: Eating and drinking  Eat a diet that includes fresh fruits and vegetables, whole grains, lean protein, and low-fat dairy products. Limit your intake of foods with high amounts of sugar, saturated fats, and salt.  Take vitamin and mineral supplements as recommended by your health care provider.  Do not drink alcohol if your health care provider tells you not to drink.  If you drink alcohol: ? Limit how much you have to 0-1 drink a day. ? Be aware of how much alcohol is in your drink. In the [place] one drink equals one 12 oz bottle of beer (355 mL), one 5 oz glass of wine (148 mL), or one 1 oz glass of hard liquor (44 mL). Lifestyle  Take daily care of your teeth and gums.  Stay active. Exercise for at least 30 minutes on 5 or more days each week.  Do not use any products that contain nicotine or tobacco, such as cigarettes, e-cigarettes, and chewing tobacco. If you need help quitting, ask your health care provider.  If you are sexually active, practice safe sex. Use a condom or other form of protection in order to prevent STIs (sexually transmitted infections).  Talk with your health care provider about taking a low-dose aspirin or statin. What's  next?  Go to your health care provider once a year for a well check visit.  Ask your health care provider how often you should have your eyes and teeth checked.  Stay up to date on all vaccines. This information is not intended to replace advice given to you by your health care provider. Make sure you discuss any questions you have with your health care provider. Document Revised: 07/22/2018 Document Reviewed: 07/22/2018 Elsevier Patient Education  2020 [facility]'s number is [phone]. Please call with questions or concerns about what we discussed today.

## 2020-01-12 DIAGNOSIS — Z1231 Encounter for screening mammogram for malignant neoplasm of breast: Secondary | ICD-10-CM | POA: Diagnosis not present

## 2020-01-16 ENCOUNTER — Other Ambulatory Visit: Payer: Self-pay | Admitting: Family Medicine

## 2020-01-16 DIAGNOSIS — G8929 Other chronic pain: Secondary | ICD-10-CM

## 2020-01-16 DIAGNOSIS — M545 Low back pain, unspecified: Secondary | ICD-10-CM

## 2020-01-17 ENCOUNTER — Other Ambulatory Visit: Payer: Self-pay

## 2020-01-17 DIAGNOSIS — M159 Polyosteoarthritis, unspecified: Secondary | ICD-10-CM

## 2020-01-17 MED ORDER — BACLOFEN 10 MG PO TABS: 10.0000 mg | ORAL_TABLET | Freq: Every day | ORAL | 0 refills | Status: DC | PRN

## 2020-01-20 ENCOUNTER — Other Ambulatory Visit: Payer: Self-pay

## 2020-01-20 ENCOUNTER — Ambulatory Visit
Admission: RE | Admit: 2020-01-20 | Discharge: 2020-01-20 | Disposition: A | Discharge status: Home / Self Care | Payer: Medicare Other | Source: Ambulatory Visit | Attending: Neurosurgery | Admitting: Neurosurgery

## 2020-01-20 DIAGNOSIS — M546 Pain in thoracic spine: Secondary | ICD-10-CM | POA: Diagnosis not present

## 2020-01-20 DIAGNOSIS — S32010A Wedge compression fracture of first lumbar vertebra, initial encounter for closed fracture: Secondary | ICD-10-CM

## 2020-01-24 ENCOUNTER — Other Ambulatory Visit: Payer: Self-pay

## 2020-01-24 ENCOUNTER — Encounter: Payer: Self-pay | Admitting: Family Medicine

## 2020-01-24 ENCOUNTER — Ambulatory Visit (INDEPENDENT_AMBULATORY_CARE_PROVIDER_SITE_OTHER): Payer: Medicare Other | Admitting: Family Medicine

## 2020-01-24 VITALS — BP 160/80 | HR 70 | Ht 69.0 in | Wt 299.6 lb

## 2020-01-24 DIAGNOSIS — G8929 Other chronic pain: Secondary | ICD-10-CM

## 2020-01-24 DIAGNOSIS — I10 Essential (primary) hypertension: Secondary | ICD-10-CM

## 2020-01-24 DIAGNOSIS — E119 Type 2 diabetes mellitus without complications: Secondary | ICD-10-CM

## 2020-01-24 DIAGNOSIS — M545 Low back pain, unspecified: Secondary | ICD-10-CM

## 2020-01-24 DIAGNOSIS — E1140 Type 2 diabetes mellitus with diabetic neuropathy, unspecified: Secondary | ICD-10-CM

## 2020-01-24 LAB — POCT GLYCOSYLATED HEMOGLOBIN (HGB A1C): HbA1c, POC (controlled diabetic range): 6.3 % (ref 0.0–7.0)

## 2020-01-24 MED ORDER — METOPROLOL TARTRATE 25 MG PO TABS: 25.0000 mg | ORAL_TABLET | Freq: Two times a day (BID) | ORAL | 0 refills | Status: DC

## 2020-01-24 MED ORDER — GABAPENTIN 100 MG PO CAPS: 100.0000 mg | ORAL_CAPSULE | Freq: Two times a day (BID) | ORAL | 3 refills | Status: DC

## 2020-01-24 MED ORDER — TRAMADOL HCL 50 MG PO TABS: ORAL_TABLET | ORAL | 0 refills | Status: DC

## 2020-01-24 NOTE — Progress Notes (Signed)
    SUBJECTIVE:   CHIEF COMPLAINT / HPI:   Chronic lower back pain Patient presents with follow-up for chronic lower back pain.  Patient follows with neurology.  Had recent MRI that showed stable compression fracture of L1 mild foraminal stenosis of T10.  Patient's pain is well controlled on tramadol.  Last prescription was filled 07/2019.  Patient takes occasionally.  Also takes Tylenol and gabapentin and baclofen for pain.  Patient states that she has no change in her back pain.  She has tried physical therapy in the past but this made her feel worse.  Patient is getting ready to go to [place] vacation and needs medication refills of metoprolol and gabapentin as well.  Patient with mild hypertension, but reports that she is she has not been taking tramadol she was taking last pill.  Denies any new leg weakness, bowel or bladder incontinence, saddle anesthesia.  Diet controlled diabetes  hemoglobin A1c at goal less than 7 today.  Patient is on an ARB for infection.  PERTINENT  PMH / PSH:   OBJECTIVE:   BP (!) 160/80   Pulse 70   Ht 5\' 9"  (1.753 m)   Wt 299 lb 9.6 oz (135.9 kg)   SpO2 98%   BMI 44.24 kg/m   Gen: NAD, resting comfortably CV: RRR with no murmurs appreciated Pulm: NWOB, CTAB with no crackles, wheezes, or rhonchi GI:  Soft, Nontender, Nondistended. MSK: Lower back is tender no edema, cyanosis, or clubbing noted Skin: warm, dry Neuro: grossly normal, moves all extremities Psych: Normal affect and thought content   ASSESSMENT/PLAN:   Back pain ` Chronic secondary to compression fracture.  Stable.  Refill tramadol, continue.  Tylenol, baclofen, gabapentin as needed.  Type 2 diabetes mellitus with hemoglobin A1c goal of less than 7.5% (HCC) Well-controlled diet controlled.  HYPERTENSION, BENIGN ESSENTIAL Elevated blood pressure, but patient is in pain.  Recommend patient take pain medication follow-up in 1 month for blood pressure check.  May need to increase  losartan or amlodipine at that point.     Bonnita Hollow, MD [place]

## 2020-01-24 NOTE — Assessment & Plan Note (Signed)
Elevated blood pressure, but patient is in pain.  Recommend patient take pain medication follow-up in 1 month for blood pressure check.  May need to increase losartan or amlodipine at that point.

## 2020-01-24 NOTE — Assessment & Plan Note (Signed)
`   Chronic secondary to compression fracture.  Stable.  Refill tramadol, continue.  Tylenol, baclofen, gabapentin as needed.

## 2020-01-24 NOTE — Assessment & Plan Note (Signed)
Well-controlled diet controlled.

## 2020-01-26 DIAGNOSIS — Z6841 Body Mass Index (BMI) 40.0 and over, adult: Secondary | ICD-10-CM | POA: Insufficient documentation

## 2020-01-26 DIAGNOSIS — S32010A Wedge compression fracture of first lumbar vertebra, initial encounter for closed fracture: Secondary | ICD-10-CM | POA: Diagnosis not present

## 2020-01-26 DIAGNOSIS — I10 Essential (primary) hypertension: Secondary | ICD-10-CM | POA: Diagnosis not present

## 2020-01-26 DIAGNOSIS — M546 Pain in thoracic spine: Secondary | ICD-10-CM | POA: Diagnosis not present

## 2020-01-27 ENCOUNTER — Telehealth: Payer: Self-pay

## 2020-01-27 NOTE — Telephone Encounter (Signed)
Patient calls nurse requesting an advance directive packet left up front for her. I have placed this in the cabinet, she will come by on Monday to pick it up.

## 2020-02-02 DIAGNOSIS — H35033 Hypertensive retinopathy, bilateral: Secondary | ICD-10-CM | POA: Diagnosis not present

## 2020-02-02 DIAGNOSIS — H524 Presbyopia: Secondary | ICD-10-CM | POA: Diagnosis not present

## 2020-02-02 DIAGNOSIS — H3561 Retinal hemorrhage, right eye: Secondary | ICD-10-CM | POA: Diagnosis not present

## 2020-02-02 LAB — HM DIABETES EYE EXAM

## 2020-02-03 ENCOUNTER — Ambulatory Visit (INDEPENDENT_AMBULATORY_CARE_PROVIDER_SITE_OTHER): Payer: Medicare Other | Admitting: Family Medicine

## 2020-02-03 ENCOUNTER — Ambulatory Visit: Payer: Medicare Other | Admitting: Family Medicine

## 2020-02-03 ENCOUNTER — Other Ambulatory Visit: Payer: Self-pay

## 2020-02-03 ENCOUNTER — Encounter: Payer: Self-pay | Admitting: Family Medicine

## 2020-02-03 VITALS — BP 142/60 | HR 62 | Wt 299.5 lb

## 2020-02-03 DIAGNOSIS — T148XXA Other injury of unspecified body region, initial encounter: Secondary | ICD-10-CM | POA: Diagnosis not present

## 2020-02-03 NOTE — Assessment & Plan Note (Addendum)
Provided reassurance that the lesion on her leg is small and does not appear to be a growing hematoma. Also informed her that she is not going to get a blood clot from this impact. Patient appreciated the information

## 2020-02-03 NOTE — Progress Notes (Addendum)
    SUBJECTIVE:   CHIEF COMPLAINT / HPI:   Leg bruising Patient dropped Metacam on her left leg 2 days ago. She said it swelled up and was painful. It has since then gotten better. She has been putting warm compresses on it. Patient is on apixaban for anticoagulation. She is concerned that she may get another blood clot from this lesion.   PERTINENT  PMH / PSH: H/o of unprovoked DVT and PE  OBJECTIVE:   BP (!) 142/60   Pulse 62   Wt 299 lb 8 oz (135.9 kg)   SpO2 99%   BMI 44.23 kg/m   Normal: NAD Left leg: $0.50 piece size lesion on the posterior aspect of the left leg, it is not warm, is not tender it is not fluctuant, there is no skin break, there is mild to no overlying  ASSESSMENT/PLAN:   Bruise Provided reassurance that the lesion on her leg is small and does not appear to be a growing hematoma. Also informed her that she is not going to get a blood clot from this impact. Patient appreciated the information      Bonnita Hollow, MD [place]

## 2020-02-04 ENCOUNTER — Other Ambulatory Visit: Payer: Self-pay | Admitting: Family Medicine

## 2020-02-04 DIAGNOSIS — M8000XA Age-related osteoporosis with current pathological fracture, unspecified site, initial encounter for fracture: Secondary | ICD-10-CM

## 2020-02-04 DIAGNOSIS — Z8781 Personal history of (healed) traumatic fracture: Secondary | ICD-10-CM

## 2020-03-13 DIAGNOSIS — D649 Anemia, unspecified: Secondary | ICD-10-CM | POA: Diagnosis not present

## 2020-03-13 DIAGNOSIS — I129 Hypertensive chronic kidney disease with stage 1 through stage 4 chronic kidney disease, or unspecified chronic kidney disease: Secondary | ICD-10-CM | POA: Diagnosis not present

## 2020-03-13 DIAGNOSIS — N183 Chronic kidney disease, stage 3 unspecified: Secondary | ICD-10-CM | POA: Diagnosis not present

## 2020-03-13 DIAGNOSIS — N2581 Secondary hyperparathyroidism of renal origin: Secondary | ICD-10-CM | POA: Diagnosis not present

## 2020-03-13 DIAGNOSIS — E1122 Type 2 diabetes mellitus with diabetic chronic kidney disease: Secondary | ICD-10-CM | POA: Diagnosis not present

## 2020-03-13 DIAGNOSIS — R809 Proteinuria, unspecified: Secondary | ICD-10-CM | POA: Diagnosis not present

## 2020-03-19 ENCOUNTER — Other Ambulatory Visit: Payer: Self-pay

## 2020-03-19 DIAGNOSIS — Z8781 Personal history of (healed) traumatic fracture: Secondary | ICD-10-CM

## 2020-03-19 DIAGNOSIS — I2699 Other pulmonary embolism without acute cor pulmonale: Secondary | ICD-10-CM

## 2020-03-19 DIAGNOSIS — M8000XA Age-related osteoporosis with current pathological fracture, unspecified site, initial encounter for fracture: Secondary | ICD-10-CM

## 2020-03-19 MED ORDER — APIXABAN 5 MG PO TABS: 5.0000 mg | ORAL_TABLET | Freq: Two times a day (BID) | ORAL | 3 refills | Status: DC

## 2020-03-19 MED ORDER — ALENDRONATE SODIUM 70 MG PO TABS: ORAL_TABLET | ORAL | 0 refills | Status: DC

## 2020-03-20 NOTE — Telephone Encounter (Signed)
Patient calls nurse line regarding medication refills. Patient states that she is in the donut hole with her insurance company and that she needs to have her medications changed to [employer].   Called and cancelled alendronate and eliquis at [facility]. Mail pharmacy requests 90 day supply of medication. Routing to PCP for resending due to change in quantity for alendronate.   Talbot Grumbling, RN

## 2020-03-20 NOTE — Addendum Note (Signed)
Addended by: Talbot Grumbling on: 03/20/2020 09:11 AM   Modules accepted: Orders

## 2020-03-21 NOTE — Telephone Encounter (Signed)
Message was sent to PCP to change Rx to go to mail order [facility] . Salvatore Marvel, CMA

## 2020-03-22 MED ORDER — APIXABAN 5 MG PO TABS: 5.0000 mg | ORAL_TABLET | Freq: Two times a day (BID) | ORAL | 3 refills | Status: DC

## 2020-03-22 MED ORDER — ALENDRONATE SODIUM 70 MG PO TABS: ORAL_TABLET | ORAL | 0 refills | Status: DC

## 2020-03-26 ENCOUNTER — Other Ambulatory Visit: Payer: Self-pay

## 2020-03-26 DIAGNOSIS — I2699 Other pulmonary embolism without acute cor pulmonale: Secondary | ICD-10-CM

## 2020-03-26 DIAGNOSIS — E1140 Type 2 diabetes mellitus with diabetic neuropathy, unspecified: Secondary | ICD-10-CM

## 2020-03-26 DIAGNOSIS — M159 Polyosteoarthritis, unspecified: Secondary | ICD-10-CM

## 2020-03-27 ENCOUNTER — Other Ambulatory Visit: Payer: Self-pay

## 2020-03-27 ENCOUNTER — Ambulatory Visit (INDEPENDENT_AMBULATORY_CARE_PROVIDER_SITE_OTHER): Payer: Medicare Other | Admitting: Family Medicine

## 2020-03-27 VITALS — BP 152/80 | HR 63 | Ht 69.0 in | Wt 298.0 lb

## 2020-03-27 DIAGNOSIS — R350 Frequency of micturition: Secondary | ICD-10-CM | POA: Diagnosis not present

## 2020-03-27 DIAGNOSIS — R399 Unspecified symptoms and signs involving the genitourinary system: Secondary | ICD-10-CM | POA: Diagnosis not present

## 2020-03-27 LAB — POCT URINALYSIS DIP (MANUAL ENTRY)
Bilirubin, UA: NEGATIVE
Glucose, UA: NEGATIVE mg/dL
Ketones, POC UA: NEGATIVE mg/dL
Nitrite, UA: NEGATIVE
Protein Ur, POC: 30 mg/dL — AB
Spec Grav, UA: 1.01 (ref 1.010–1.025)
Urobilinogen, UA: 0.2 E.U./dL
pH, UA: 6.5 (ref 5.0–8.0)

## 2020-03-27 MED ORDER — AMLODIPINE BESYLATE 5 MG PO TABS: 5.0000 mg | ORAL_TABLET | Freq: Every day | ORAL | 3 refills | Status: DC

## 2020-03-27 MED ORDER — GABAPENTIN 100 MG PO CAPS: 100.0000 mg | ORAL_CAPSULE | Freq: Two times a day (BID) | ORAL | 3 refills | Status: DC

## 2020-03-27 MED ORDER — CEPHALEXIN 500 MG PO CAPS: 500.0000 mg | ORAL_CAPSULE | Freq: Four times a day (QID) | ORAL | 0 refills | Status: AC

## 2020-03-27 MED ORDER — BACLOFEN 10 MG PO TABS: 10.0000 mg | ORAL_TABLET | Freq: Every day | ORAL | 0 refills | Status: DC | PRN

## 2020-03-27 MED ORDER — METOPROLOL TARTRATE 25 MG PO TABS: 25.0000 mg | ORAL_TABLET | Freq: Two times a day (BID) | ORAL | 0 refills | Status: DC

## 2020-03-27 MED ORDER — ATORVASTATIN CALCIUM 40 MG PO TABS: 40.0000 mg | ORAL_TABLET | Freq: Every day | ORAL | 3 refills | Status: DC

## 2020-03-27 NOTE — Patient Instructions (Signed)
Thank you for coming in today to be seen.  Your urine will be processed by the lab today.  Please return if you do not have any improvement with antibiotics within 2 to 3 days.

## 2020-03-27 NOTE — Assessment & Plan Note (Addendum)
Patient symptoms atypical for urinary tract infection except for urinary frequency.  However, patient does report having the same symptoms last year which resolved with antibiotic therapy.  No history of kidney stones for cystocele. No culture history.  UA returned with leukocytes and trace-intact RBCs. If patient does not improve within 2 to 3 days of antibiotics, she should return to the office for further evaluation and other causes of lower abdominal pain after urination. Keflex QID x 5 days

## 2020-03-27 NOTE — Progress Notes (Signed)
    SUBJECTIVE:   CHIEF COMPLAINT / HPI:   UTI symptoms  Patient presenting with 1 week of increased urinary frequency.  She denies any urgency.  She reports that she has post urination pain in her lower abdomen that goes away within a few minutes.  She denies any shortness and pain and describes it is more dull.  She does not have any blood in her urine.  She had the same symptoms last year which responded well to antibiotics.  No history of kidney stones.  She does not feel any protrusion from her vagina with bearing down or with urination.  PERTINENT  PMH / PSH: DM II, CKD III (followed by [facility]), primary hypercoagulable state   OBJECTIVE:   BP (!) 152/80   Pulse 63   Ht 5\' 9"  (1.753 m)   Wt 298 lb (135.2 kg)   SpO2 100%   BMI 44.01 kg/m   General: Well-appearing female, no acute distress Abdomen: No tenderness to palpation of lower abdomen.  No CVA tenderness.  ASSESSMENT/PLAN:   Urinary frequency Patient symptoms atypical for urinary tract infection except for urinary frequency.  However, patient does report having the same symptoms last year which resolved with antibiotic therapy.  No history of kidney stones for cystocele. No culture history.  UA returned with leukocytes and trace-intact RBCs. If patient does not improve within 2 to 3 days of antibiotics, she should return to the office for further evaluation and other causes of lower abdominal pain after urination. Keflex QID x 5 days     Wilber Oliphant, MD [place]

## 2020-03-27 NOTE — Telephone Encounter (Signed)
Eliquis refill sent 5 days ago.

## 2020-03-29 ENCOUNTER — Other Ambulatory Visit: Payer: Self-pay

## 2020-03-29 ENCOUNTER — Encounter: Payer: Self-pay | Admitting: Family Medicine

## 2020-03-29 ENCOUNTER — Ambulatory Visit (INDEPENDENT_AMBULATORY_CARE_PROVIDER_SITE_OTHER): Payer: Medicare Other | Admitting: Family Medicine

## 2020-03-29 VITALS — BP 130/84 | HR 58 | Ht 69.0 in | Wt 298.0 lb

## 2020-03-29 DIAGNOSIS — R809 Proteinuria, unspecified: Secondary | ICD-10-CM

## 2020-03-29 DIAGNOSIS — R350 Frequency of micturition: Secondary | ICD-10-CM | POA: Diagnosis not present

## 2020-03-29 DIAGNOSIS — I10 Essential (primary) hypertension: Secondary | ICD-10-CM | POA: Diagnosis not present

## 2020-03-29 DIAGNOSIS — E785 Hyperlipidemia, unspecified: Secondary | ICD-10-CM

## 2020-03-29 DIAGNOSIS — R300 Dysuria: Secondary | ICD-10-CM

## 2020-03-29 DIAGNOSIS — E1169 Type 2 diabetes mellitus with other specified complication: Secondary | ICD-10-CM | POA: Diagnosis not present

## 2020-03-29 LAB — POCT URINALYSIS DIP (CLINITEK)
Bilirubin, UA: NEGATIVE
Blood, UA: NEGATIVE
Glucose, UA: NEGATIVE mg/dL
Ketones, POC UA: NEGATIVE mg/dL
Leukocytes, UA: NEGATIVE
Nitrite, UA: NEGATIVE
POC PROTEIN,UA: 30 — AB
Spec Grav, UA: 1.01 (ref 1.010–1.025)
Urobilinogen, UA: 0.2 E.U./dL
pH, UA: 6.5 (ref 5.0–8.0)

## 2020-03-29 LAB — POCT UA - MICROSCOPIC ONLY

## 2020-03-29 MED ORDER — LOSARTAN POTASSIUM 50 MG PO TABS: 50.0000 mg | ORAL_TABLET | Freq: Every day | ORAL | Status: DC

## 2020-03-29 NOTE — Progress Notes (Signed)
   SUBJECTIVE:   CHIEF COMPLAINT / HPI:   Chief Complaint  Patient presents with  . Follow-up    uti      Erin Coffey is a 74 y.o. female here for UTI follow-up  UTI Patient symptoms started last Saturday.  She has been taking Keflex that she was given for UTI on 03/27/2020.  Patient states that she continues to have abdominal pain when she urinates.  Denies current dysuria, nausea, vomiting, fatigue and back pain.  She is concerned that she has protein in her urine as her urine is "bubbly".  Takes losartan given to her by her nephrologist.  Hyperlipidemia Patient takes Lipitor 40 mg daily.  Reports being compliant with this medication.  Denies chest pain, shortness of breath, lower extremity claudication.  PERTINENT  PMH / PSH: Recent UTI, proteinuria, hyperlipidemia, type 2 diabetes  OBJECTIVE:   BP 130/84   Pulse (!) 58   Ht 5\' 9"  (1.753 m)   Wt 298 lb (135.2 kg)   SpO2 96%   BMI 44.01 kg/m    GEN: Well-appearing female, in no acute distress  CV: regular rate and rhythm, no murmurs appreciated  RESP: no increased work of breathing, clear to ascultation bilaterally ABD: Bowel sounds present. Soft, Nontender, Nondistended.  No palpable masses MSK: no lower extremity edema SKIN: warm, dry, normal skin turgor     ASSESSMENT/PLAN:    UTI Urinalysis without evidence of infection today.  Continue Keflex.  If fever or increased abdominal pain advised patient to come back to clinic. -Consider urine culture if not improving.    Chronic kidney disease Follows with [facility].  States that she was seen recently for proteinuria.  Losartan was increased to 50 mg.   -We will check for proteinuria today as she reports foamy urine -BMP -Urinalysis  Proteinuria Urinalysis significant for protein today.  Continue losartan 50 mg daily.  Defer further work-up to nephrologist.  Hyperlipidemia associated with type 2 diabetes mellitus ([place])  Lipid panel  today: Total cholesterol 159, triglycerides 49, HDL 80, LDL 69.  Continue current medications. The 10-year ASCVD risk score Mikey Bussing DC Brooke Bonito., et al., 2013) is: 26.4%   Values used to calculate the score:     Age: 61 years     Sex: Female     Is Non-Hispanic African American: Yes     Diabetic: Yes     Tobacco smoker: No     Systolic Blood Pressure: 594 mmHg     Is BP treated: Yes     HDL Cholesterol: 80 mg/dL     Total Cholesterol: 159 mg/dL      [name], DO PGY-2, [facility] 03/29/2020

## 2020-03-29 NOTE — Patient Instructions (Signed)
It was great seeing you today! ? ?Please check-out at the front desk before leaving the clinic. I'd like to see you back in 3 months but if you need to be seen earlier than that for any new issues we're happy to fit you in, just give us a call! ? ?Visit Remembers: ?- Stop by the pharmacy to pick up your prescriptions  ?- Continue to work on your healthy eating habits and incorporating exercise into your daily life.  ?- Your goal is to have an BP < 130/80 ? ? ?Regarding lab work today:  ?Due to recent changes in healthcare laws, you may see the results of your imaging and laboratory studies on MyChart before your provider has had a chance to review them.  I understand that in some cases there may be results that are confusing or concerning to you. Not all laboratory results come back in the same time frame and you may be waiting for multiple results in order to interpret others.  Please give us 72 hours in order for your provider to thoroughly review all the results before contacting the office for clarification of your results. If everything is normal, you will get a letter in the mail or a message in My Chart. Please give us a call if you do not hear from us after 2 weeks. ? ?Please bring all of your medications with you to each visit.  ?  ?If you haven't already, sign up for My Chart to have easy access to your labs results, and communication with your primary care physician. ? ?Feel free to call with any questions or concerns at any time, at [phone]. ?  ?Take care,  ?Dr. Raul Torrance ?[facility]  ?

## 2020-03-30 LAB — BASIC METABOLIC PANEL
BUN/Creatinine Ratio: 9 — ABNORMAL LOW (ref 12–28)
BUN: 10 mg/dL (ref 8–27)
CO2: 23 mmol/L (ref 20–29)
Calcium: 9.3 mg/dL (ref 8.7–10.3)
Chloride: 106 mmol/L (ref 96–106)
Creatinine, Ser: 1.15 mg/dL — ABNORMAL HIGH (ref 0.57–1.00)
GFR calc Af Amer: 55 mL/min/{1.73_m2} — ABNORMAL LOW (ref >59)
GFR calc non Af Amer: 48 mL/min/{1.73_m2} — ABNORMAL LOW (ref >59)
Glucose: 88 mg/dL (ref 65–99)
Potassium: 5.1 mmol/L (ref 3.5–5.2)
Sodium: 139 mmol/L (ref 134–144)

## 2020-03-30 LAB — LIPID PANEL
Chol/HDL Ratio: 2 ratio (ref 0.0–4.4)
Cholesterol, Total: 159 mg/dL (ref 100–199)
HDL: 80 mg/dL (ref >39)
LDL Chol Calc (NIH): 69 mg/dL (ref 0–99)
Triglycerides: 49 mg/dL (ref 0–149)
VLDL Cholesterol Cal: 10 mg/dL (ref 5–40)

## 2020-03-31 ENCOUNTER — Telehealth: Payer: Self-pay | Admitting: Family Medicine

## 2020-03-31 NOTE — Telephone Encounter (Signed)
AFTER HOURS EMERGENCY LINE CALL  Patient calls reporting she feels "off". She was at the doctor's office Tuesday of this week with concerns for UTI, she was told she had some protein in her urine. On Thursday she came back in for wellness visit and had BMP and lipid panels drawn as well as repeat UA, the repeat UA was better than the previous test from 2 days prior. She also had improved renal function on BMP.   She was put on Keflex qid for Bladder Infection. She doesn't have pain when she urinates, just after she urinates in her lower abdomen, it's around her navel, it's a sharp like pain. She has a pain in the right side of her back that started on and off for 2 days. She has a history of DDD in her low back. Temp at home is 98.5 and 98.6*F at home. No nausea, vomiting. She is able to eat OK and keep foods down. She is able to keep water down and stay hydrated. She denies blood in urine. She has IBS with mixed Constipation and Diarrhea, denies any recent bowel changes.    Low suspicion of pyelonephritis as she is afebrile, no history of kidney stones, also tolerating food/drink well. No other abdominal pains or bowel changes to suggest appendicitis.   I encouraged the patient to continue to consume water to flush her bladder and take her meds as prescribed. Also instructed that should she develop nausea, vomiting, inability to tolerate PO that she should call us back or make her way to the ER. She voiced understanding and agrees with plan.   Erin Coffey, [place], PGY-3 03/31/2020 8:03 AM

## 2020-04-03 DIAGNOSIS — R809 Proteinuria, unspecified: Secondary | ICD-10-CM | POA: Insufficient documentation

## 2020-04-03 NOTE — Assessment & Plan Note (Signed)
Follows with [facility].  States that she was seen recently for proteinuria.  Losartan was increased to 50 mg.   -We will check for proteinuria today as she reports foamy urine -BMP -Urinalysis

## 2020-04-03 NOTE — Assessment & Plan Note (Addendum)
Urinalysis significant for protein today.  Continue losartan 50 mg daily.  Defer further work-up to nephrologist.

## 2020-04-03 NOTE — Assessment & Plan Note (Addendum)
  Lipid panel today: Total cholesterol 159, triglycerides 49, HDL 80, LDL 69.  Continue current medications. The 10-year ASCVD risk score Mikey Bussing DC Brooke Bonito., et al., 2013) is: 26.4%   Values used to calculate the score:     Age: 74 years     Sex: Female     Is Non-Hispanic African American: Yes     Diabetic: Yes     Tobacco smoker: No     Systolic Blood Pressure: 536 mmHg     Is BP treated: Yes     HDL Cholesterol: 80 mg/dL     Total Cholesterol: 159 mg/dL

## 2020-04-05 ENCOUNTER — Telehealth: Payer: Self-pay

## 2020-04-05 NOTE — Telephone Encounter (Signed)
Spoke with patient about the medication Eliquis. Patient stated that she now has all of her prescriptions coming from [facility] mail service except her TRAMADOL that she still gets from the [facility] [address]. I asked patient if she will call the [facility] pharm and inform them of this change. She stated that she would. Salvatore Marvel, CMA

## 2020-04-19 ENCOUNTER — Encounter: Payer: Self-pay | Admitting: Family Medicine

## 2020-04-19 ENCOUNTER — Ambulatory Visit (INDEPENDENT_AMBULATORY_CARE_PROVIDER_SITE_OTHER): Payer: Medicare Other | Admitting: Family Medicine

## 2020-04-19 ENCOUNTER — Other Ambulatory Visit: Payer: Self-pay

## 2020-04-19 VITALS — BP 140/60 | HR 68 | Ht 69.0 in | Wt 297.2 lb

## 2020-04-19 DIAGNOSIS — R140 Abdominal distension (gaseous): Secondary | ICD-10-CM

## 2020-04-19 DIAGNOSIS — Z23 Encounter for immunization: Secondary | ICD-10-CM

## 2020-04-19 DIAGNOSIS — E119 Type 2 diabetes mellitus without complications: Secondary | ICD-10-CM | POA: Diagnosis not present

## 2020-04-19 DIAGNOSIS — L0232 Furuncle of buttock: Secondary | ICD-10-CM | POA: Insufficient documentation

## 2020-04-19 LAB — POCT GLYCOSYLATED HEMOGLOBIN (HGB A1C): HbA1c, POC (controlled diabetic range): 6.1 % (ref 0.0–7.0)

## 2020-04-19 MED ORDER — MUPIROCIN CALCIUM 2 % EX CREA: 1.0000 "application " | TOPICAL_CREAM | Freq: Two times a day (BID) | CUTANEOUS | 0 refills | Status: DC

## 2020-04-19 MED ORDER — SIMETHICONE 125 MG PO CAPS: 1.0000 | ORAL_CAPSULE | Freq: Every day | ORAL | 0 refills | Status: DC | PRN

## 2020-04-19 NOTE — Progress Notes (Signed)
   SUBJECTIVE:   CHIEF COMPLAINT / HPI:   Chief Complaint  Patient presents with  . Follow-up    diabetes     Erin Coffey is a 74 y.o. female here for diabetes follow up.   Diabetes Mellitus  Denies increased thirst, hunger, or frequent urination.   Bump on Buttocks Patient reports having a hemorrhoid like bump. Reports foul smelling bloody discharge. Bump appeared last week. Denies bloody stool. Takes iron daily therefore her stools are usually dark.  Denies fever, diarrhea, constipation. Endorses "rumbling-like" abdominal pain.    PERTINENT  PMH / PSH: reviewed and updated as appropriate   OBJECTIVE:   BP 140/60   Pulse 68   Ht 5\' 9"  (1.753 m)   Wt 297 lb 4 oz (134.8 kg)   SpO2 98%   BMI 43.90 kg/m   GEN: pleasant older female, in no acute distress  CV: regular rate and rhythm, no murmurs appreciated  RESP: no increased work of breathing, clear to ascultation bilaterally  ABD: Bowel sounds present. Soft, Nontender, Nondistended.  MSK: no LE edema, or calf tenderness  SKIN: warm, dry, right side of gluteal crease with indurated boil with bloody discharge  NEURO: grossly normal, moves all extremities appropriately PSYCH: Normal affect, appropriate speech and behavior    ASSESSMENT/PLAN:   Type 2 diabetes mellitus with hemoglobin A1c goal of less than 7.5% (HCC) A1c today 6.1. Diet controlled.  -Follow up in 6 months  -Patient UTD on eye exam (May/June 2021 per patient)   Boil of buttock Exam c/w boil on right buttock. Patient declined oral Abx. Topical mupirocin instead. Follow up if not improving for I&D vs surgical evaluation. Patient agrees with plan.   Gas related abdominal pain - trial Gas-Ex.   Patient received influenza vaccine today.   [name], DO PGY-2, [facility] 04/19/2020

## 2020-04-19 NOTE — Assessment & Plan Note (Signed)
A1c today 6.1. Diet controlled.  -Follow up in 6 months  -Patient UTD on eye exam (May/June 2021 per patient)

## 2020-04-19 NOTE — Assessment & Plan Note (Signed)
Exam c/w boil on right buttock. Patient declined oral Abx. Topical mupirocin instead. Follow up if not improving for I&D vs surgical evaluation. Patient agrees with plan.

## 2020-04-19 NOTE — Patient Instructions (Signed)
It was great seeing you today!   - Stop by the pharmacy to pick up your medications  - IF the area is not improved in 1-2 weeks call to schedule an appointment   If you have questions or concerns please do not hesitate to call at [phone].  Dr. Rushie [facility]

## 2020-04-21 ENCOUNTER — Other Ambulatory Visit: Payer: Self-pay | Admitting: Family Medicine

## 2020-04-21 DIAGNOSIS — M8000XA Age-related osteoporosis with current pathological fracture, unspecified site, initial encounter for fracture: Secondary | ICD-10-CM

## 2020-04-21 DIAGNOSIS — Z8781 Personal history of (healed) traumatic fracture: Secondary | ICD-10-CM

## 2020-04-23 ENCOUNTER — Telehealth: Payer: Self-pay

## 2020-04-23 DIAGNOSIS — R809 Proteinuria, unspecified: Secondary | ICD-10-CM | POA: Diagnosis not present

## 2020-04-23 NOTE — Telephone Encounter (Signed)
Called patient to discuss referral. Pain around naval radiating to right lower back.  Denies constipation. Reports pain after bowel movements as well. No blood in stool. Increased bloating and gas. Denies N/V. Reports pressure in rectum.  Reports pain onset 8/17 around time of bladder infection.   To PCP  Talbot Grumbling, RN

## 2020-04-23 NOTE — Telephone Encounter (Signed)
Patient calls nurse line regarding receiving referral for GI specialist. Patient was seen in the office on 04/19/2020.   Please advise  Talbot Grumbling, RN

## 2020-04-24 NOTE — Telephone Encounter (Signed)
Called patient and scheduled office visit for next Monday, 04/30/2020.   Talbot Grumbling, RN

## 2020-04-29 ENCOUNTER — Other Ambulatory Visit: Payer: Self-pay | Admitting: Family Medicine

## 2020-04-29 DIAGNOSIS — E1140 Type 2 diabetes mellitus with diabetic neuropathy, unspecified: Secondary | ICD-10-CM

## 2020-04-30 ENCOUNTER — Ambulatory Visit (INDEPENDENT_AMBULATORY_CARE_PROVIDER_SITE_OTHER): Payer: Medicare Other | Admitting: Family Medicine

## 2020-04-30 ENCOUNTER — Encounter: Payer: Self-pay | Admitting: Family Medicine

## 2020-04-30 ENCOUNTER — Other Ambulatory Visit: Payer: Self-pay

## 2020-04-30 VITALS — BP 138/78 | HR 78 | Wt 295.0 lb

## 2020-04-30 DIAGNOSIS — M545 Low back pain, unspecified: Secondary | ICD-10-CM

## 2020-04-30 DIAGNOSIS — G8929 Other chronic pain: Secondary | ICD-10-CM | POA: Diagnosis not present

## 2020-04-30 DIAGNOSIS — R1030 Lower abdominal pain, unspecified: Secondary | ICD-10-CM | POA: Diagnosis not present

## 2020-04-30 DIAGNOSIS — R10813 Right lower quadrant abdominal tenderness: Secondary | ICD-10-CM

## 2020-04-30 DIAGNOSIS — R109 Unspecified abdominal pain: Secondary | ICD-10-CM | POA: Insufficient documentation

## 2020-04-30 LAB — POCT UA - MICROSCOPIC ONLY

## 2020-04-30 LAB — POCT URINALYSIS DIP (MANUAL ENTRY)
Bilirubin, UA: NEGATIVE
Glucose, UA: NEGATIVE mg/dL
Ketones, POC UA: NEGATIVE mg/dL
Leukocytes, UA: NEGATIVE
Nitrite, UA: NEGATIVE
Protein Ur, POC: 100 mg/dL — AB
Spec Grav, UA: 1.025
Urobilinogen, UA: 0.2 U/dL
pH, UA: 6

## 2020-04-30 MED ORDER — TRAMADOL HCL 50 MG PO TABS: ORAL_TABLET | ORAL | 0 refills | Status: DC

## 2020-04-30 NOTE — Progress Notes (Signed)
   SUBJECTIVE:   CHIEF COMPLAINT / HPI:   Chief Complaint  Patient presents with  . Abdominal Pain     Erin Coffey is a 74 y.o. female here for abdominal pain.   ABDOMINAL PAIN Patient reports progressively worsening nagging pain in the lower abdominal since having a UTI in mid August. Pain feels like gas but reports no help with Gas-Ex or Miralax. Has pressure on her rectum like she needs to have a bowel movement. Pain rated 7/10. Worse with urination and having bowel movement. Pain stops after she goes to the bathroom again. Pain radiates to right lower back. Her father died of colon cancer and sister died of leukemia. Past abdomianl surgeries: tubal ligation in 1971.    Symptoms: Nausea/Vomiting: no  Diarrhea: no  Constipation: no  Melena/BRBPR: no  Hematemesis: no  Anorexia: no  Fever/Chills: no  Dysuria: no  Rash: no  Wt loss: no  EtOH use: no  NSAIDs/ASA: no  LMP: postmenopausal  Vaginal bleeding: no  STD risk/hx: no    PERTINENT  PMH / PSH: reviewed and updated as appropriate   OBJECTIVE:   BP 138/78   Pulse 78   Wt 295 lb (133.8 kg)   SpO2 98%   BMI 43.56 kg/m    GEN: pleasant elderly female, in no acute distress  CV: regular rate and rhythm, no murmurs appreciated  RESP: no increased work of breathing, clear to ascultation bilaterally  ABD: Bowel sounds present. Soft, RLQ >LLQ tenderness, right CVA tenderness, nondistended. MSK: normal ROM, non tender SKIN: warm, dry NEURO: grossly normal, moves all extremities appropriately PSYCH: Normal affect, appropriate speech and behavior    ASSESSMENT/PLAN:   Lower abdominal pain Uncontrolled lower abdominal pain with relief with Gas-ex and Miralax. On exam, pt with moderate RLQ tenderness. Father died of colon cancer. Review colonoscopy from 2017. No Had tubal ligation previously. No recent fevers, GI bleeding or nausea, vomiting, diarrhea. DDx IBS, nephrolithiasis,  Appendicitis, colitits, colon  cancer, ovarian mass, pancreatitis, colonic abscess.  Not likely an acute abdomen. Will obtain CT ABD/Pelvis, CMP, CBC, UA and lipase to better evaluate  - UA: +hematuria, no evidence of pyuria  -Spoke with radiology radiology regarding [place], DO PGY-2, [facility] 04/30/2020

## 2020-04-30 NOTE — Assessment & Plan Note (Addendum)
Uncontrolled lower abdominal pain with relief with Gas-ex and Miralax. On exam, pt with moderate RLQ tenderness. Father died of colon cancer. Review colonoscopy from 2017. No Had tubal ligation previously. No recent fevers, GI bleeding or nausea, vomiting, diarrhea. DDx IBS, nephrolithiasis,  Appendicitis, colitits, colon cancer, ovarian mass, pancreatitis, colonic abscess.  Not likely an acute abdomen. Will obtain CT ABD/Pelvis, CMP, CBC, UA and lipase to better evaluate  - UA: +hematuria, no evidence of pyuria  -Spoke with radiology radiology regarding CT -Discuss diet at follow up visit

## 2020-04-30 NOTE — Patient Instructions (Addendum)
It was great seeing you today! Please check-out at the front desk before leaving the clinic.  Visit Remembers: I will call you to discuss your lab results  Get abdominal CT as scheduled    Regarding lab work today:  Due to recent changes in healthcare laws, you may see the results of your imaging and laboratory studies on MyChart before your provider has had a chance to review them.  I understand that in some cases there may be results that are confusing or concerning to you. Not all laboratory results come back in the same time frame and you may be waiting for multiple results in order to interpret others.  Please give [name] 72 hours in order for your provider to thoroughly review all the results before contacting the office for clarification of your results. If everything is normal, you will get a letter in the mail or a message in My Chart. Please give [name] a call if you do not hear from [name] after 2 weeks.  Please bring all of your medications with you to each visit.    If you haven't already, sign up for My Chart to have easy access to your labs results, and communication with your primary care physician.  Feel free to call with any questions or concerns at any time, at [phone].   Take care,  Dr. Rushie [facility]

## 2020-05-01 LAB — COMPREHENSIVE METABOLIC PANEL
ALT: 11 IU/L (ref 0–32)
AST: 14 IU/L (ref 0–40)
Albumin/Globulin Ratio: 0.9 — ABNORMAL LOW (ref 1.2–2.2)
Albumin: 3.8 g/dL (ref 3.7–4.7)
Alkaline Phosphatase: 67 IU/L (ref 44–121)
BUN/Creatinine Ratio: 11 — ABNORMAL LOW (ref 12–28)
BUN: 15 mg/dL (ref 8–27)
Bilirubin Total: 0.4 mg/dL (ref 0.0–1.2)
CO2: 22 mmol/L (ref 20–29)
Calcium: 9.1 mg/dL (ref 8.7–10.3)
Chloride: 106 mmol/L (ref 96–106)
Creatinine, Ser: 1.32 mg/dL — ABNORMAL HIGH (ref 0.57–1.00)
GFR calc Af Amer: 46 mL/min/{1.73_m2} — ABNORMAL LOW (ref >59)
GFR calc non Af Amer: 40 mL/min/{1.73_m2} — ABNORMAL LOW (ref >59)
Globulin, Total: 4.2 g/dL (ref 1.5–4.5)
Glucose: 89 mg/dL (ref 65–99)
Potassium: 4.7 mmol/L (ref 3.5–5.2)
Sodium: 142 mmol/L (ref 134–144)
Total Protein: 8 g/dL (ref 6.0–8.5)

## 2020-05-01 LAB — CBC WITH DIFFERENTIAL/PLATELET
Basophils Absolute: 0 10*3/uL (ref 0.0–0.2)
Basos: 0 %
EOS (ABSOLUTE): 0.3 10*3/uL (ref 0.0–0.4)
Eos: 4 %
Hematocrit: 33.2 % — ABNORMAL LOW (ref 34.0–46.6)
Hemoglobin: 11.3 g/dL (ref 11.1–15.9)
Immature Grans (Abs): 0 10*3/uL (ref 0.0–0.1)
Immature Granulocytes: 0 %
Lymphocytes Absolute: 2.5 10*3/uL (ref 0.7–3.1)
Lymphs: 38 %
MCH: 29.5 pg (ref 26.6–33.0)
MCHC: 34 g/dL (ref 31.5–35.7)
MCV: 87 fL (ref 79–97)
Monocytes Absolute: 0.7 10*3/uL (ref 0.1–0.9)
Monocytes: 11 %
Neutrophils Absolute: 3 10*3/uL (ref 1.4–7.0)
Neutrophils: 47 %
Platelets: 223 10*3/uL (ref 150–450)
RBC: 3.83 x10E6/uL (ref 3.77–5.28)
RDW: 14.5 % (ref 11.7–15.4)
WBC: 6.6 10*3/uL (ref 3.4–10.8)

## 2020-05-01 LAB — LIPASE: Lipase: 31 U/L (ref 14–85)

## 2020-05-10 ENCOUNTER — Ambulatory Visit
Admission: RE | Admit: 2020-05-10 | Discharge: 2020-05-10 | Disposition: A | Discharge status: Home / Self Care | Payer: Medicare Other | Source: Ambulatory Visit | Attending: Family Medicine | Admitting: Family Medicine

## 2020-05-10 DIAGNOSIS — K5730 Diverticulosis of large intestine without perforation or abscess without bleeding: Secondary | ICD-10-CM | POA: Diagnosis not present

## 2020-05-10 DIAGNOSIS — K6289 Other specified diseases of anus and rectum: Secondary | ICD-10-CM | POA: Diagnosis not present

## 2020-05-10 DIAGNOSIS — M47816 Spondylosis without myelopathy or radiculopathy, lumbar region: Secondary | ICD-10-CM | POA: Diagnosis not present

## 2020-05-10 DIAGNOSIS — R10813 Right lower quadrant abdominal tenderness: Secondary | ICD-10-CM

## 2020-05-10 DIAGNOSIS — R1030 Lower abdominal pain, unspecified: Secondary | ICD-10-CM

## 2020-05-10 DIAGNOSIS — M5386 Other specified dorsopathies, lumbar region: Secondary | ICD-10-CM | POA: Diagnosis not present

## 2020-05-10 MED ORDER — IOPAMIDOL (ISOVUE-300) INJECTION 61%
125.0000 mL | Freq: Once | INTRAVENOUS | Status: AC | PRN
  Administered 2020-05-10: 100 mL via INTRAVENOUS

## 2020-05-15 ENCOUNTER — Telehealth: Payer: Self-pay

## 2020-05-15 DIAGNOSIS — R1030 Lower abdominal pain, unspecified: Secondary | ICD-10-CM

## 2020-05-15 NOTE — Addendum Note (Signed)
Addended by: [name] D on: 05/15/2020 06:13 PM   Modules accepted: Orders

## 2020-05-15 NOTE — Telephone Encounter (Signed)
Patient calls nurse line requesting imaging results. Please advise.

## 2020-05-18 ENCOUNTER — Encounter: Payer: Self-pay | Admitting: Physician Assistant

## 2020-05-29 ENCOUNTER — Encounter: Payer: Self-pay | Admitting: Physician Assistant

## 2020-05-29 ENCOUNTER — Ambulatory Visit: Payer: Medicare Other | Admitting: Physician Assistant

## 2020-05-29 ENCOUNTER — Telehealth: Payer: Self-pay

## 2020-05-29 VITALS — BP 128/80 | HR 77 | Ht 69.0 in | Wt 296.0 lb

## 2020-05-29 DIAGNOSIS — R1084 Generalized abdominal pain: Secondary | ICD-10-CM | POA: Diagnosis not present

## 2020-05-29 DIAGNOSIS — K219 Gastro-esophageal reflux disease without esophagitis: Secondary | ICD-10-CM

## 2020-05-29 DIAGNOSIS — R9389 Abnormal findings on diagnostic imaging of other specified body structures: Secondary | ICD-10-CM | POA: Diagnosis not present

## 2020-05-29 MED ORDER — PLENVU 140 G PO SOLR: 1.0000 | ORAL | 0 refills | Status: DC

## 2020-05-29 NOTE — Patient Instructions (Signed)
If you are age 72 or older, your body mass index should be between 23-30. Your Body mass index is 43.71 kg/m. If this is out of the aforementioned range listed, please consider follow up with your Primary Care Provider.  If you are age 51 or younger, your body mass index should be between 19-25. Your Body mass index is 43.71 kg/m. If this is out of the aformentioned range listed, please consider follow up with your Primary Care Provider.   You have been scheduled for a colonoscopy. Please follow written instructions given to you at your visit today.  Please pick up your prep supplies at the pharmacy within the next 1-3 days. If you use inhalers (even only as needed), please bring them with you on the day of your procedure.  You will be contacted by our office prior to your procedure for directions on holding your Eliquis.  If you do not hear from our office 1 week prior to your scheduled procedure, please call [phone] to discuss.   Follow up pending the results of your Colonoscopy or as needed.

## 2020-05-29 NOTE — Progress Notes (Signed)
 Subjective:    Patient ID: Erin Coffey, female    DOB: 01/16/1946, 74 y.o.   MRN: [id number]  HPI [place] is a 74-year-old African-American female, established with Dr. Danis, last seen in 2017 at which time she underwent colonoscopy in May 2017 with finding of multiple left colon diverticuli and internal hemorrhoids. She comes in today with complaints of recent lower abdominal pain, and also had a CT scan done on 05/11/2020 with concerns for rectal abnormality. Patient has history of hypercoagulable disorder, and prior DVT/pulmonary embolism and is on chronic Eliquis.  Also with obesity, GERD, osteoarthritis, chronic radiculopathy and adult onset diabetes mellitus. Patient had CT of the abdomen pelvis done for complaints of right lower quadrant pain on 05/11/2020.  This showed diverticulosis without diverticulitis, and soft tissue density extending into the lumen in the region of the rectum cannot exclude rectal mass.  There is a moderate compression deformity L1 appears chronic and degenerative changes throughout the lumbar spine. Patient says that her pain started in August and is actually much better over the past several weeks than it had been at onset.  Her only current complaint is gassiness.  She says the pain was located in the right lower abdomen she has not had any left-sided abdominal discomfort.  She denies any rectal pain or discomfort.  Appetite has been fine, no complaints of nausea or vomiting.  She does not recall having any changes in her abdominal discomfort with p.o. intake.  She has been having normal bowel movements has not noticed any changes in her bowel habits no melena or hematochezia.  Review of Systems Pertinent positive and negative review of systems were noted in the above HPI section.  All other review of systems was otherwise negative.  Outpatient Encounter Medications as of 05/29/2020  Medication Sig  . acetaminophen (TYLENOL) 650 MG CR tablet Take 1,300 mg  by mouth every 8 (eight) hours as needed for pain.  . alendronate (FOSAMAX) 70 MG tablet TAKE 1 TABLET BY MOUTH  EVERY 7 DAYS WITH A FULL  GLASS OF WATER ON AN EMPTY  STOMACH  . amLODipine (NORVASC) 5 MG tablet Take 1 tablet (5 mg total) by mouth at bedtime.  . apixaban (ELIQUIS) 5 MG TABS tablet Take 1 tablet (5 mg total) by mouth 2 (two) times daily. Begin after completing starterpack for eliquis  . atorvastatin (LIPITOR) 40 MG tablet Take 1 tablet (40 mg total) by mouth daily.  . baclofen (LIORESAL) 10 MG tablet Take 1 tablet (10 mg total) by mouth daily as needed for muscle spasms.  . Blood Glucose Monitoring Suppl (ONETOUCH VERIO) w/Device KIT Use as instructed to test sugars once daily.  Dx Code: E11.9  . diclofenac Sodium (VOLTAREN) 1 % GEL Apply 2 g topically at bedtime. Apply to shoulder  . ELDERBERRY PO Take 2 g by mouth daily at 12 noon.  . fish oil-omega-3 fatty acids 1000 MG capsule Take 1 g by mouth daily.   . gabapentin (NEURONTIN) 100 MG capsule TAKE 1 CAPSULE BY MOUTH  TWICE DAILY  . glucose blood (ONETOUCH VERIO) test strip Use as instructed to test sugars once daily.  Dx Code: E11.9  . glucose blood test strip Use as instructed  . iron polysaccharides (FERREX 150) 150 MG capsule Take 1 capsule (150 mg total) by mouth daily.  . ketoconazole (NIZORAL) 2 % cream Apply 1 application topically daily as needed for irritation. Apply to rash area as needed  . Lancet Devices (ONE TOUCH DELICA   LANCING DEV) MISC Use as instructed to test sugars once daily.  Dx Code: E11.9  . losartan (COZAAR) 50 MG tablet Take 1 tablet (50 mg total) by mouth daily.  . metoprolol tartrate (LOPRESSOR) 25 MG tablet TAKE 1 TABLET BY MOUTH  TWICE DAILY  . mupirocin cream (BACTROBAN) 2 % Apply 1 application topically 2 (two) times daily.  . omeprazole (PRILOSEC) 20 MG capsule Take 20 mg by mouth daily.  . OneTouch Delica Lancets 33G MISC Use as instructed to test sugars once daily.  Dx Code: E11.9  .  Simethicone (GAS-X EXTRA STRENGTH) 125 MG CAPS Take 1 capsule (125 mg total) by mouth daily as needed.  . traMADol (ULTRAM) 50 MG tablet TAKE 1 TABLET BY MOUTH EVERY 6 HOURS AS NEEDED FOR MODERATE PAIN  . triamcinolone cream (KENALOG) 0.1 % Apply 1 application topically daily as needed. Apply to rash  . Calcium Carb-Cholecalciferol (CALCIUM 500+D3) 500-400 MG-UNIT TABS Take 1 tablet by mouth daily. (Patient not taking: Reported on 05/29/2020)  . PEG-KCl-NaCl-NaSulf-Na Asc-C (PLENVU) 140 g SOLR Take 1 kit by mouth as directed.  . polyethylene glycol powder (GLYCOLAX/MIRALAX) powder Take 17 g by mouth 2 (two) times daily as needed. (Patient taking differently: Take 17 g by mouth daily as needed (constipation). )   No facility-administered encounter medications on file as of 05/29/2020.   Allergies  Allergen Reactions  . Lisinopril Cough   Patient Active Problem List   Diagnosis Date Noted  . Lower abdominal pain 04/30/2020  . Boil of buttock 04/19/2020  . Mild coronary artery disease 10/2019  . History of spinal fracture 03/24/2019  . Age-related osteoporosis with current pathological fracture 03/24/2019  . Nontraumatic complete tear of left rotator cuff 03/24/2019  . Anginal chest pain at rest (HCC) 03/24/2019  . Primary hypercoagulable state (HCC) 03/07/2019  . Osteoporotic compression fracture of spine (HCC) 03/07/2019  . Pulmonary emboli (HCC) 03/03/2019  . Chronic left hip pain 08/17/2018  . Left sided numbness 07/22/2018  . Onychogryphosis 05/31/2018  . Tinea pedis of right foot 05/31/2018  . Wheezing 05/31/2018  . Low back pain potentially associated with radiculopathy 01/29/2018  . Diverticulosis of colon 03/24/2017  . Hypertensive retinopathy of right eye 01/20/2017  . Hypermetropia of both eyes 01/20/2017  . Back pain 01/05/2017  . Chronic kidney disease 04/29/2016  . Normocytic anemia 04/29/2016  . Dark stools 10/19/2015  . Rotator cuff dysfunction 06/25/2015  . Bruise  12/07/2014  . Long term (current) use of anticoagulants 05/08/2011  . Hyperlipidemia associated with type 2 diabetes mellitus (HCC) 08/08/2008  . Type 2 diabetes mellitus with hemoglobin A1c goal of less than 7.5% (HCC) 06/05/2008  . HYPERTENSION, BENIGN ESSENTIAL 11/27/2006  . GASTROESOPHAGEAL REFLUX, NO ESOPHAGITIS 10/08/2006  . Osteoarthritis, multiple sites 10/08/2006   Social History   Socioeconomic History  . Marital status: Widowed    Spouse name: Not on file  . Number of children: 3  . Years of education: 10  . Highest education level: 10th grade  Occupational History  . Occupation: Retired- cook    Employer: [employer]  Tobacco Use  . Smoking status: Never Smoker  . Smokeless tobacco: Never Used  Vaping Use  . Vaping Use: Never used  Substance and Sexual Activity  . Alcohol use: No    Alcohol/week: 0.0 standard drinks  . Drug use: No  . Sexual activity: Not Currently  Other Topics Concern  . Not on file  Social History Narrative   Patient lives alone in [place],   she is a widow.   Patient has 3 children. Two daughters live locally, one son in [place].    Patient is very active in her church and volunteer work to stay busy.    Patient enjoys cooking, spending time with family, and crossword puzzles.       Social Determinants of Health   Financial Resource Strain:   . Difficulty of Paying Living Expenses: Not on file  Food Insecurity:   . Worried About Running Out of Food in the Last Year: Not on file  . Ran Out of Food in the Last Year: Not on file  Transportation Needs:   . Lack of Transportation (Medical): Not on file  . Lack of Transportation (Non-Medical): Not on file  Physical Activity:   . Days of Exercise per Week: Not on file  . Minutes of Exercise per Session: Not on file  Stress:   . Feeling of Stress : Not on file  Social Connections: Moderately Integrated  . Frequency of Communication with Friends and Family: More than three times a week    . Frequency of Social Gatherings with Friends and Family: More than three times a week  . Attends Religious Services: More than 4 times per year  . Active Member of Clubs or Organizations: Yes  . Attends Club or Organization Meetings: More than 4 times per year  . Marital Status: Widowed  Intimate Partner Violence:   . Fear of Current or Ex-Partner: Not on file  . Emotionally Abused: Not on file  . Physically Abused: Not on file  . Sexually Abused: Not on file    Ms. Perin's family history includes Cancer (age of onset: 13) in her sister; Cirrhosis in her sister; Diabetes in her brother, mother, sister, and sister; Heart disease in her father, mother, and sister; Hypertension in her brother, father, and mother; Kidney disease in her sister.      Objective:    Vitals:   05/29/20 1007  BP: 128/80  Pulse: 77  SpO2: 97%    Physical Exam Well-developed well-nourished obese AA  female in no acute distress.  Height, Weight296, BMI 43.8  HEENT; nontraumatic normocephalic, EOMI, PER R LA, sclera anicteric. Oropharynx; not examined today Neck; supple, no JVD Cardiovascular; regular rate and rhythm with S1-S2, no murmur rub or gallop Pulmonary; Clear bilaterally Abdomen; soft, obese, no focal tenderness,, nondistended, no palpable mass or hepatosplenomegaly, bowel sounds are active Rectal; no stool in the rectal vault no anal rectal abnormality palpated Skin; benign exam, no jaundice rash or appreciable lesions Extremities; no clubbing cyanosis or edema skin warm and dry Neuro/Psych; alert and oriented x4, grossly nonfocal mood and affect appropriate       Assessment & Plan:   #1 74-year-old African-American female with episode of right lower abdominal pain onset August 2021 which has essentially resolved.  CT of the abdomen pelvis done on 05/11/2020 did not show any findings to explain the right lower quadrant discomfort.  She does have extensive degenerative changes in her back,  and a chronic L1 compression fracture. Question if she could have had a component of radicular pain due to her back disease.  #2 abnormal soft tissue density/fullness of the rectum on CT October 2021 cannot rule out rectal mass. No abnormality palpated on digital exam today.  #3 diverticulosis 4.  Chronic anticoagulation-on Eliquis-hypercoagulable disorder 5.  Osteoarthritis 6.  GERD 7.  Prior DVT/PE as above.  Patient will be scheduled for colonoscopy with Dr. Danis.  Procedure was discussed   in detail with the patient including indications risks and benefits and she is agreeable to proceed. We will communicate with her primary care physician/team who has been managing anticoagulation.  Patient will need to hold Eliquis for 48 hours prior to procedure. Patient has been having severe left hip pain today, she relates she has required steroid injections etc. in the past.  Her mobility is quite compromised today.  We will intentionally schedule colonoscopy out a few weeks to allow her time to have the left hip evaluated and treated.   Naomi Fitton S Reeanna Acri PA-C 05/29/2020   Cc: [name], DO

## 2020-05-29 NOTE — Telephone Encounter (Signed)
 [facility] Pre-operative Risk Assessment     Request for surgical clearance:     Endoscopy Procedure  What type of surgery is being performed?     Colonoscopy  When is this surgery scheduled?     06/29/20  What type of clearance is required ?   Pharmacy  Are there any medications that need to be held prior to surgery and how long? Eliquis starting 2 days prior  Practice name and name of physician performing surgery?      [facility]  What is your office phone and fax number?      Phone- [phone]  [phone]  Anesthesia type (None, local, MAC, general) ?       [facility]

## 2020-05-30 ENCOUNTER — Other Ambulatory Visit: Payer: Self-pay

## 2020-05-30 ENCOUNTER — Ambulatory Visit (INDEPENDENT_AMBULATORY_CARE_PROVIDER_SITE_OTHER): Payer: Medicare Other | Admitting: Student in an Organized Health Care Education/Training Program

## 2020-05-30 DIAGNOSIS — M5432 Sciatica, left side: Secondary | ICD-10-CM | POA: Insufficient documentation

## 2020-05-30 MED ORDER — PREDNISONE 10 MG (21) PO TBPK: ORAL_TABLET | ORAL | 0 refills | Status: DC

## 2020-05-30 NOTE — Assessment & Plan Note (Signed)
Sciatic vs piriformis pain on left. Negative for hip joint or spine joints pathology on exam. No red flag symptoms.  Prednisone pack has helped in previous flares. (occur once per year). Offered physical therapy but patient declined.  Discussed local vs PO steroids and patient elected to do a steroid pack.  Prednisone 5mg  steroid pack prescribed. Patient familiar with instructions.  Also provided with home remedies including stretches for prevention.  If not improved with treatment, consider imaging, nerve studies, referral.

## 2020-05-30 NOTE — Progress Notes (Deleted)
    SUBJECTIVE:   CHIEF COMPLAINT / HPI: pain  ***  PERTINENT  PMH / PSH: ***  OBJECTIVE:   BP 112/72   Pulse 86   Wt 296 lb 9.6 oz (134.5 kg)   SpO2 98%   BMI 43.80 kg/m   Physical Exam: Gen: NAD, comfortable in exam room *** Observation: Palpation: ROM: Strength: Sensation: Special tests:  ASSESSMENT/PLAN:   No problem-specific Assessment & Plan notes found for this encounter.     [place]

## 2020-05-30 NOTE — Patient Instructions (Addendum)
It was a pleasure to see you today!  To summarize our discussion for this visit:  I'm sorry to hear about your sciatica pain. We will try a steroid dose pack to calm down the inflammation. Please follow up with your PCP if having any negative side effects and to check kidney function after completion.  I recommend physical therapy but there are some home exercises and stretches that you can also do to help prevent this from flaring up.  Some additional health maintenance measures we should update are: Health Maintenance Due  Topic Date Due  . MAMMOGRAM  11/24/2019  .    Call the clinic at [phone] if your symptoms worsen or you have any concerns.   Thank you for allowing me to take part in your care,  Dr. Doristine Mango   Sciatica  Sciatica is pain, weakness, tingling, or loss of feeling (numbness) along the sciatic nerve. The sciatic nerve starts in the lower back and goes down the back of each leg. Sciatica usually goes away on its own or with treatment. Sometimes, sciatica may come back (recur). What are the causes? This condition happens when the sciatic nerve is pinched or has pressure put on it. This may be the result of:  A disk in between the bones of the spine bulging out too far (herniated disk).  Changes in the spinal disks that occur with aging.  A condition that affects a muscle in the butt.  Extra bone growth near the sciatic nerve.  A break (fracture) of the area between your hip bones (pelvis).  Pregnancy.  Tumor. This is rare. What increases the risk? You are more likely to develop this condition if you:  Play sports that put pressure or stress on the spine.  Have poor strength and ease of movement (flexibility).  Have had a back injury in the past.  Have had back surgery.  Sit for long periods of time.  Do activities that involve bending or lifting over and over again.  Are very overweight (obese). What are the signs or  symptoms? Symptoms can vary from mild to very bad. They may include:  Any of these problems in the lower back, leg, hip, or butt: ? Mild tingling, loss of feeling, or dull aches. ? Burning sensations. ? Sharp pains.  Loss of feeling in the back of the calf or the sole of the foot.  Leg weakness.  Very bad back pain that makes it hard to move. These symptoms may get worse when you cough, sneeze, or laugh. They may also get worse when you sit or stand for long periods of time. How is this treated? This condition often gets better without any treatment. However, treatment may include:  Changing or cutting back on physical activity when you have pain.  Doing exercises and stretching.  Putting ice or heat on the affected area.  Medicines that help: ? To relieve pain and swelling. ? To relax your muscles.  Shots (injections) of medicines that help to relieve pain, irritation, and swelling.  Surgery. Follow these instructions at home: Medicines  Take over-the-counter and prescription medicines only as told by your doctor.  Ask your doctor if the medicine prescribed to you: ? Requires you to avoid driving or using heavy machinery. ? Can cause trouble pooping (constipation). You may need to take these steps to prevent or treat trouble pooping:  Drink enough fluids to keep your pee (urine) pale yellow.  Take over-the-counter or prescription medicines.  Eat foods  that are high in fiber. These include beans, whole grains, and fresh fruits and vegetables.  Limit foods that are high in fat and sugar. These include fried or sweet foods. Managing pain      If told, put ice on the affected area. ? Put ice in a plastic bag. ? Place a towel between your skin and the bag. ? Leave the ice on for 20 minutes, 2-3 times a day.  If told, put heat on the affected area. Use the heat source that your doctor tells you to use, such as a moist heat pack or a heating pad. ? Place a towel  between your skin and the heat source. ? Leave the heat on for 20-30 minutes. ? Remove the heat if your skin turns bright red. This is very important if you are unable to feel pain, heat, or cold. You may have a greater risk of getting burned. Activity   Return to your normal activities as told by your doctor. Ask your doctor what activities are safe for you.  Avoid activities that make your symptoms worse.  Take short rests during the day. ? When you rest for a long time, do some physical activity or stretching between periods of rest. ? Avoid sitting for a long time without moving. Get up and move around at least one time each hour.  Exercise and stretch regularly, as told by your doctor.  Do not lift anything that is heavier than 10 lb (4.5 kg) while you have symptoms of sciatica. ? Avoid lifting heavy things even when you do not have symptoms. ? Avoid lifting heavy things over and over.  When you lift objects, always lift in a way that is safe for your body. To do this, you should: ? Bend your knees. ? Keep the object close to your body. ? Avoid twisting. General instructions  Stay at a healthy weight.  Wear comfortable shoes that support your feet. Avoid wearing high heels.  Avoid sleeping on a mattress that is too soft or too hard. You might have less pain if you sleep on a mattress that is firm enough to support your back.  Keep all follow-up visits as told by your doctor. This is important. Contact a doctor if:  You have pain that: ? Wakes you up when you are sleeping. ? Gets worse when you lie down. ? Is worse than the pain you have had in the past. ? Lasts longer than 4 weeks.  You lose weight without trying. Get help right away if:  You cannot control when you pee (urinate) or poop (have a bowel movement).  You have weakness in any of these areas and it gets worse: ? Lower back. ? The area between your hip bones. ? Butt. ? Legs.  You have redness or  swelling of your back.  You have a burning feeling when you pee. Summary  Sciatica is pain, weakness, tingling, or loss of feeling (numbness) along the sciatic nerve.  This condition happens when the sciatic nerve is pinched or has pressure put on it.  Sciatica can cause pain, tingling, or loss of feeling (numbness) in the lower back, legs, hips, and butt.  Treatment often includes rest, exercise, medicines, and putting ice or heat on the affected area. This information is not intended to replace advice given to you by your health care provider. Make sure you discuss any questions you have with your health care provider. Document Revised: 08/16/2018 Document Reviewed: 08/16/2018 Elsevier Patient  Education  2020 [employer]  

## 2020-05-30 NOTE — Progress Notes (Signed)
    SUBJECTIVE:   CHIEF COMPLAINT / HPI:   Left hip/buttocks pain radiating down to left knee. Started when weather got colder. About a week ago. This occurs once a year when the weather changes and feels the same as previous flares. In the past, she has used steroid packs which improved the pain. Has been trying the tramadol and tylenol and it is not working. Has not helped in the past either. No injury or fall when this flared up.  Difficulty bending over and sitting on the commode.  No numbness or tingling.  Does not feel like her leg is going to give out.  No incontinence.  Right side is ok.   Nov 10th has colonoscopy scheduled.   OBJECTIVE:   BP 112/72   Pulse 86   Wt 296 lb 9.6 oz (134.5 kg)   SpO2 98%   BMI 43.80 kg/m   Physical Exam: Gen: NAD, comfortable in exam room L hip Observation: algesic gait. Much difficulty with ambulation and getting on exam table.  Palpation: tenderness to palpation of left upper mid buttocks. No tenderness to midline spine or lateral hip. Right side normal.  ROM: full on right, decreased by pain on left by about 50% in hip flexion Strength: not tested in left due to pain. Normal in right Sensation: intact and symmetrical  Special tests: negative FABIR/FADER, leg roll. Positive straight leg test on left for pain. Neg on right.  ASSESSMENT/PLAN:   Sciatic nerve pain, left Sciatic vs piriformis pain on left. Negative for hip joint or spine joints pathology on exam. No red flag symptoms.  Prednisone pack has helped in previous flares. (occur once per year). Offered physical therapy but patient declined.  Discussed local vs PO steroids and patient elected to do a steroid pack.  Prednisone 5mg  steroid pack prescribed. Patient familiar with instructions.  Also provided with home remedies including stretches for prevention.  If not improved with treatment, consider imaging, nerve studies, referral.     Richarda Osmond, [place]

## 2020-05-31 ENCOUNTER — Other Ambulatory Visit: Payer: Self-pay

## 2020-05-31 ENCOUNTER — Ambulatory Visit (HOSPITAL_COMMUNITY)
Admission: EM | Admit: 2020-05-31 | Discharge: 2020-05-31 | Disposition: A | Discharge status: Home / Self Care | Payer: Medicare Other | Attending: Family Medicine | Admitting: Family Medicine

## 2020-05-31 ENCOUNTER — Telehealth: Payer: Self-pay

## 2020-05-31 ENCOUNTER — Encounter (HOSPITAL_COMMUNITY): Payer: Self-pay | Admitting: *Deleted

## 2020-05-31 DIAGNOSIS — M79605 Pain in left leg: Secondary | ICD-10-CM | POA: Diagnosis not present

## 2020-05-31 MED ORDER — METHYLPREDNISOLONE SODIUM SUCC 125 MG IJ SOLR
80.0000 mg | Freq: Once | INTRAMUSCULAR | Status: AC
  Administered 2020-05-31: 80 mg via INTRAVENOUS

## 2020-05-31 MED ORDER — METHYLPREDNISOLONE SODIUM SUCC 125 MG IJ SOLR
INTRAMUSCULAR | Status: AC
  Filled 2020-05-31: qty 2

## 2020-05-31 NOTE — Discharge Instructions (Signed)
Steroid shot given here  Other medicines as needed.  Follow up as needed for continued or worsening symptoms

## 2020-05-31 NOTE — Telephone Encounter (Signed)
Patient calls nurse line stating the Prednisone pack given to her yesterday is upsetting her stomach. Patient reports she can no longer take this. Patient stated the provider mentioned yesterday the option of a shot verses oral. However, patient declined shot at the time. Patient requesting the shot now if possible. I advised patient we do not have any more apts this week. UC was given as an option if she continues to be in uncontrollable pain. Please advise.

## 2020-05-31 NOTE — Progress Notes (Signed)
____________________________________________________________  Attending physician addendum:  Thank you for sending this case to me. I have reviewed the entire note, and the outlined plan seems appropriate.  Colonoscopy reasonable.  I reviewed the CT images, it is difficult to tell what if anything this is, especially as distal in the rectum as it appears to be and nothing found on DRE.  Wilfrid Lund, MD  ____________________________________________________________

## 2020-05-31 NOTE — ED Triage Notes (Signed)
Patient states she has left leg sciatic pain and was seen at [facility] on yesterday.Patient states she was given a prescription on prednisone. Patient states she took one dose of prednisone on yesterday but was unable to tolerate due to it upsetting her stomach.Patient is also seen by GI. Patient states she was told to come in by doctor's office due to PCP and the patient was not able to get into an appointment until next Monday.

## 2020-06-01 NOTE — ED Provider Notes (Signed)
[place]    CSN: [id number] Arrival date & time: 05/31/20  9371      History   Chief Complaint Chief Complaint  Patient presents with  . Leg Pain    HPI Erin Coffey is a 74 y.o. female.   Patient is a 74 year old female presents today with leg pain due to sciatic nerve issue.  Was seen by her primary care doctor yesterday and started on prednisone taper.  Reports this is upsetting her stomach.  Is currently followed by GI for chronic GI issues.  Pain is located to left lateral upper thigh area.  Describes as pulling.  No falls or injuries to the leg.  No increased swelling or erythema.  No concern for blood clots.  She is also been using tramadol and Flexeril with some relief     Past Medical History:  Diagnosis Date  . Allergy   . Anemia, iron deficiency   . ANXIETY, SITUATIONAL 04/06/2009   Qualifier: Diagnosis of  By: [name]  MD, Colletta [place]    . Arthritis   . Back pain, thoracic 12/20/2012  . Carpal tunnel syndrome 12/07/2014  . Cholelithiasis 06/25/2012  . Chronic kidney disease   . DM type 2 goal A1C below 7.5 06/05/2008   Qualifier: Diagnosis of  By: [name]  MD, Colletta [place]    . GASTROESOPHAGEAL REFLUX, NO ESOPHAGITIS 10/08/2006   Qualifier: Diagnosis of  By: [name]    . Goiter, unspecified 10/21/2007   Qualifier: Diagnosis of  By: [name] MD, [name]    . History of DVT (deep vein thrombosis) 05/05/2013   on Eliquis  . HYPERLIPIDEMIA 08/08/2008   Qualifier: Diagnosis of  By: [name]  MD, Colletta [place]    . HYPERTENSION, BENIGN ESSENTIAL 11/27/2006   Qualifier: Diagnosis of  By: [name] MD, [place]    . Mild coronary artery disease 10/2019   Coronary CT Angiogram: Coronary calcium score 46.Normal coronary origin.  Left dominance with tortuous coronary arteries.  Nondominant RCA - prox < 25% followed by ectasia (dilated to 5.5 mm) mixed plaque in distal vessel.  LM- normal . LAD: minimal mixed plaque in ostial & proximal (<25%) with brief IM bridging in mLAD. Mild  mid-distal (25-49%), RI - prox <25%, Large LCx- minimal (<25%) mid-dista  . Mild coronary artery disease   . Personal history of colonic polyps 10/24/2009   Qualifier: Diagnosis of  By: [name] MD, Sandy Salaam   . Pica 08/08/2009   Qualifier: Diagnosis of  By: [name], [name]    . Polymyalgia rheumatica ([place]) 10/02/2009   Qualifier: Diagnosis of  By: [name] MD, Apolonio Schneiders    . Pulmonary embolism ([place]) - On long Term anticoagulation (Z79.01) 04/28/2011   Converted from warfarin to Eliquis; on long-term anticoagulation.  . Thyroid disease   . Trigger ring finger of left hand 11/02/2014    Patient Active Problem List   Diagnosis Date Noted  . Sciatic nerve pain, left 05/30/2020  . Lower abdominal pain 04/30/2020  . Boil of buttock 04/19/2020  . Mild coronary artery disease 10/2019  . History of spinal fracture 03/24/2019  . Age-related osteoporosis with current pathological fracture 03/24/2019  . Nontraumatic complete tear of left rotator cuff 03/24/2019  . Anginal chest pain at rest [facility]) 03/24/2019  . Primary hypercoagulable state ([place]) 03/07/2019  . Osteoporotic compression fracture of spine ([place]) 03/07/2019  . Pulmonary emboli ([place]) 03/03/2019  . Chronic left hip pain 08/17/2018  . Left sided numbness 07/22/2018  . Onychogryphosis 05/31/2018  . Tinea pedis of  right foot 05/31/2018  . Wheezing 05/31/2018  . Low back pain potentially associated with radiculopathy 01/29/2018  . Diverticulosis of colon 03/24/2017  . Hypertensive retinopathy of right eye 01/20/2017  . Hypermetropia of both eyes 01/20/2017  . Back pain 01/05/2017  . Chronic kidney disease 04/29/2016  . Normocytic anemia 04/29/2016  . Dark stools 10/19/2015  . Rotator cuff dysfunction 06/25/2015  . Bruise 12/07/2014  . Long term (current) use of anticoagulants 05/08/2011  . Hyperlipidemia associated with type 2 diabetes mellitus ([place]) 08/08/2008  . Type 2 diabetes mellitus with hemoglobin A1c goal of less than 7.5% ([place])  06/05/2008  . HYPERTENSION, BENIGN ESSENTIAL 11/27/2006  . GASTROESOPHAGEAL REFLUX, NO ESOPHAGITIS 10/08/2006  . Osteoarthritis, multiple sites 10/08/2006    Past Surgical History:  Procedure Laterality Date  . ABDOMINAL HYSTERECTOMY    . CARDIAC CATHETERIZATION  04/29/11   (Dr. Lia Foyer [facility]): LM - normal. LAD with 1 major Diag - normal, bifurcating Ramus - normal, Large (6-7 mm) Dominant LCx with 1 OM, 2 LPL branches & LPDA - non significant disease; small non-dominant RCA.    [name] CARDIAC EVENT MONITOR  09/2019   Mostly sinus rhythm, average rate 60 bpm.  Range 45 to 190 bpm.  No critical events indicating any arrhythmias or pauses.  For patient responses noted PVCs.  Less than 1% PVCs noted.   [name] CARPAL TUNNEL RELEASE Right 1996  . COLONOSCOPY    . KNEE ARTHROSCOPY  Bilateral  . TRANSTHORACIC ECHOCARDIOGRAM  09/2019   Normal EF - 60 to 65%.  Mild LVH.  GR 1 DD.  Normal RV.  Trivial MR.  Normal IVC.  Normal PA pressures.  . TUBAL LIGATION    . UPPER GASTROINTESTINAL ENDOSCOPY      OB History   No obstetric history on file.      Home Medications    Prior to Admission medications   Medication Sig Start Date End Date Taking? Authorizing Provider  alendronate (FOSAMAX) 70 MG tablet TAKE 1 TABLET BY MOUTH  EVERY 7 DAYS WITH A FULL  GLASS OF WATER ON AN EMPTY  STOMACH 04/23/20  Yes Brimage, Vondra, DO  amLODipine (NORVASC) 5 MG tablet Take 1 tablet (5 mg total) by mouth at bedtime. 03/27/20  Yes Brimage, Vondra, DO  apixaban (ELIQUIS) 5 MG TABS tablet Take 1 tablet (5 mg total) by mouth 2 (two) times daily. Begin after completing starterpack for eliquis 03/22/20  Yes Brimage, Vondra, DO  atorvastatin (LIPITOR) 40 MG tablet Take 1 tablet (40 mg total) by mouth daily. 03/27/20  Yes Brimage, Vondra, DO  baclofen (LIORESAL) 10 MG tablet Take 1 tablet (10 mg total) by mouth daily as needed for muscle spasms. 03/27/20  Yes Brimage, Vondra, DO  iron polysaccharides (FERREX 150) 150 MG  capsule Take 1 capsule (150 mg total) by mouth daily. 03/03/19  Yes Bonnita Hollow, MD  losartan (COZAAR) 50 MG tablet Take 1 tablet (50 mg total) by mouth daily. 03/29/20  Yes Brimage, Vondra, DO  metoprolol tartrate (LOPRESSOR) 25 MG tablet TAKE 1 TABLET BY MOUTH  TWICE DAILY 05/01/20  Yes Brimage, Vondra, DO  omeprazole (PRILOSEC) 20 MG capsule Take 20 mg by mouth daily.   Yes [provider]  predniSONE (STERAPRED UNI-PAK 21 TAB) 10 MG (21) TBPK tablet Follow packet instructions for tapering dose over 6 days 05/30/20  Yes Anderson, Chelsey L, DO  Simethicone (GAS-X EXTRA STRENGTH) 125 MG CAPS Take 1 capsule (125 mg total) by mouth daily as needed.  04/19/20  Yes Brimage, Vondra, DO  traMADol (ULTRAM) 50 MG tablet TAKE 1 TABLET BY MOUTH EVERY 6 HOURS AS NEEDED FOR MODERATE PAIN 04/30/20  Yes Brimage, Vondra, DO  triamcinolone cream (KENALOG) 0.1 % Apply 1 application topically daily as needed. Apply to rash   Yes [provider]  acetaminophen (TYLENOL) 650 MG CR tablet Take 1,300 mg by mouth every 8 (eight) hours as needed for pain.    [provider]  Blood Glucose Monitoring Suppl (ONETOUCH VERIO) w/Device KIT Use as instructed to test sugars once daily.  Dx Code: E11.9 02/10/19   Martyn Malay, MD  Calcium Carb-Cholecalciferol (CALCIUM 500+D3) 500-400 MG-UNIT TABS Take 1 tablet by mouth daily. Patient not taking: Reported on 05/29/2020    [provider]  diclofenac Sodium (VOLTAREN) 1 % GEL Apply 2 g topically at bedtime. Apply to shoulder    [provider]  ELDERBERRY PO Take 2 g by mouth daily at 12 noon.    [provider]  fish oil-omega-3 fatty acids 1000 MG capsule Take 1 g by mouth daily.     [provider]  gabapentin (NEURONTIN) 100 MG capsule TAKE 1 CAPSULE BY MOUTH  TWICE DAILY 05/01/20   Brimage, Vondra, DO  glucose blood (ONETOUCH VERIO) test strip Use as instructed to test sugars once daily.  Dx Code: E11.9 02/07/19    Josephine Igo B, MD  glucose blood test strip Use as instructed 12/10/15   Smiley Houseman, MD  ketoconazole (NIZORAL) 2 % cream Apply 1 application topically daily as needed for irritation. Apply to rash area as needed    [provider]  Lancet Devices (ONE TOUCH DELICA LANCING DEV) MISC Use as instructed to test sugars once daily.  Dx Code: E11.9 02/10/19   Martyn Malay, MD  mupirocin cream (BACTROBAN) 2 % Apply 1 application topically 2 (two) times daily. 04/19/20   [name], DO  OneTouch Delica Lancets 83T MISC Use as instructed to test sugars once daily.  Dx Code: E11.9 02/10/19   Martyn Malay, MD  PEG-KCl-NaCl-NaSulf-Na Asc-C (PLENVU) 140 g SOLR Take 1 kit by mouth as directed. 05/29/20   Esterwood, Amy S, PA-C  polyethylene glycol powder (GLYCOLAX/MIRALAX) powder Take 17 g by mouth 2 (two) times daily as needed. Patient taking differently: Take 17 g by mouth daily as needed (constipation).  08/17/18   Myles Gip, DO    Family History Family History  Problem Relation Age of Onset  . Diabetes Mother   . Heart disease Mother   . Hypertension Mother   . Heart disease Father   . Hypertension Father   . Diabetes Sister   . Heart disease Sister   . Hypertension Brother   . Cancer Sister 54       lukemia  . Cirrhosis Sister        alcohol  . Kidney disease Sister   . Diabetes Sister   . Diabetes Brother   . Colon cancer Neg Hx   . Pancreatic cancer Neg Hx   . Esophageal cancer Neg Hx     Social History Social History   Tobacco Use  . Smoking status: Never Smoker  . Smokeless tobacco: Never Used  Vaping Use  . Vaping Use: Never used  Substance Use Topics  . Alcohol use: No    Alcohol/week: 0.0 standard drinks  . Drug use: No     Allergies   Lisinopril   Review of Systems Review of Systems  Physical Exam Triage Vital Signs ED Triage Vitals  Enc Vitals Group     BP 05/31/20 1018 (!) 150/52     Pulse Rate 05/31/20 1018 (!) 56      Resp 05/31/20 1018 16     Temp 05/31/20 1018 98.4 F (36.9 C)     Temp Source 05/31/20 1018 Oral     SpO2 05/31/20 1018 100 %     Weight --      Height --      Head Circumference --      Peak Flow --      Pain Score 05/31/20 1012 10     Pain Loc --      Pain Edu? --      Excl. in [place]? --    No data found.  Updated Vital Signs BP (!) 150/52 (BP Location: Left Arm)   Pulse (!) 56   Temp 98.4 F (36.9 C) (Oral)   Resp 16   SpO2 100%   Visual Acuity Right Eye Distance:   Left Eye Distance:   Bilateral Distance:    Right Eye Near:   Left Eye Near:    Bilateral Near:     Physical Exam Vitals and nursing note reviewed.  Constitutional:      General: She is not in acute distress.    Appearance: Normal appearance. She is not ill-appearing, toxic-appearing or diaphoretic.  HENT:     Head: Normocephalic.     Nose: Nose normal.  Eyes:     Conjunctiva/sclera: Conjunctivae normal.  Pulmonary:     Effort: Pulmonary effort is normal.  Musculoskeletal:        General: Normal range of motion.     Cervical back: Normal range of motion.       Legs:  Skin:    General: Skin is warm and dry.     Findings: No rash.  Neurological:     Mental Status: She is alert.  Psychiatric:        Mood and Affect: Mood normal.      UC Treatments / Results  Labs (all labs ordered are listed, but only abnormal results are displayed) Labs Reviewed - No data to display  EKG   Radiology No results found.  Procedures Procedures (including critical care time)  Medications Ordered in UC Medications  methylPREDNISolone sodium succinate (SOLU-MEDROL) 125 mg/2 mL injection 80 mg (80 mg Intravenous Given 05/31/20 1059)    Initial Impression / Assessment and Plan / UC Course  I have reviewed the triage vital signs and the nursing notes.  Pertinent labs & imaging results that were available during my care of the patient were reviewed by me and considered in my medical decision making  (see chart for details).     Leg pain, most likely piriformis syndrome.  Steroid shot given here as requested due to intolerance of p.o. prednisone. Other medicines as needed Follow up as needed for continued or worsening symptoms  Final Clinical Impressions(s) / UC Diagnoses   Final diagnoses:  Left leg pain     Discharge Instructions     Steroid shot given here  Other medicines as needed.  Follow up as needed for continued or worsening symptoms     ED Prescriptions    None     PDMP not reviewed this encounter.   Orvan July, NP 06/01/20 1114

## 2020-06-04 DIAGNOSIS — M25552 Pain in left hip: Secondary | ICD-10-CM | POA: Diagnosis not present

## 2020-06-04 DIAGNOSIS — M545 Low back pain, unspecified: Secondary | ICD-10-CM | POA: Diagnosis not present

## 2020-06-04 DIAGNOSIS — M25562 Pain in left knee: Secondary | ICD-10-CM | POA: Diagnosis not present

## 2020-06-15 ENCOUNTER — Ambulatory Visit (INDEPENDENT_AMBULATORY_CARE_PROVIDER_SITE_OTHER): Payer: Medicare Other | Admitting: Family Medicine

## 2020-06-15 ENCOUNTER — Other Ambulatory Visit: Payer: Self-pay

## 2020-06-15 ENCOUNTER — Encounter: Payer: Self-pay | Admitting: Family Medicine

## 2020-06-15 DIAGNOSIS — M545 Low back pain, unspecified: Secondary | ICD-10-CM

## 2020-06-15 DIAGNOSIS — G8929 Other chronic pain: Secondary | ICD-10-CM

## 2020-06-15 MED ORDER — TRAMADOL HCL 50 MG PO TABS: ORAL_TABLET | ORAL | 0 refills | Status: DC

## 2020-06-15 NOTE — Patient Instructions (Signed)
It was a great seeing you today!  Colonoscopies are low risk procedures, I will be contacting your gastroenterologist to see if he would be okay with just 1 to 2 days off of your Eliquis.  If he requires the full 5 days off of Eliquis, we may have to do a bridging therapy with Lovenox and be will be taught how to do that with our pharmacist.  Once I hear back from his office, I will call and let you know what we will need to do.  With any kind of procedure there is always a risk of clots, though this is a lower risk procedure.

## 2020-06-15 NOTE — Progress Notes (Signed)
  Patient Name: Erin Coffey Date of Birth: 1946/04/11 Date of Visit: 06/15/20 PCP: [name], DO  Chief Complaint: preoperative evaluation Surgeon:  Wilfrid Lund Surgery: Colonoscopy Date of Procedure: 11/19  Subjective: Erin Coffey is a pleasant 74 y.o. with medical history significant for history of PE, HTN, CAD, back pain, IDA, CKD presenting today for preop evaluation for colonoscopy eyes patient is currently on Eliquis.    Medical History: Cardiac conditions? HTN, mild CAD History of VTE? Yes History of adverse surgical outcome? No  Diabetes? Yes, diet-controlled Obesity? Yes OSA? No    Prior surgeries: B/l laparoscopic knee Difficulty with surgical procedures in the past: No History of difficulty with anesthesia: No History of venous thromboembolic disease: Hx of 2 blood clots 2012 and another blood in 2020, currently on Eliqius History of easy bleeding with procedures: No Family history of venous thromboembolic disease:No Family history of bleeding diathesis:No    ROS:  Positive hx of venous thromboembolism  ROS Denies chest pain, dyspnea on exertion, chronic cough, family history of venous thromboembolism, personal or family history of easy bleeding or bruising, or personal history or family history of difficulty with anesthesia.  Chest pain with exertion? No Dyspnea on exertion? Yes Can you walk 2 blocks without dyspnea or chest pain? Yes Can you walk up a flight of stairs without chest pain or dyspnea? No Any history of easy bruising or bleeding? No Any family history of bleeding diathesis? No Any personal or family of difficulty with anesthesia? No Any history of sleep apnea? No  I have reviewed the patient's medical, surgical, family, and social history as appropriate.   Vitals:   06/15/20 1003  BP: (!) 160/98  Pulse: 94  SpO2: 98%   Filed Weights   06/15/20 1003  Weight: 295 lb (133.8 kg)   Gen: well-appearing, NAD CV: RRR, no  m/r/g appreciated, no peripheral edema Pulm: CTAB, no wheezes/crackles GI: soft, non-tender, non-distended   Joniah was seen today for clearance to stop eliquis for procedure and medication refill.  Diagnoses and all orders for this visit:  Chronic bilateral low back pain without sciatica -     traMADol (ULTRAM) 50 MG tablet; TAKE 1 TABLET BY MOUTH EVERY 6 HOURS AS NEEDED FOR MODERATE PAIN    Patient is moderate risk for a low risk surgery.  The patient has been medically optimized for this procedure. In addition,  I make the following recommendations for further evaluation and medication adjustments prior to their  planned procedure: Interruption of Eliquis dosing for only 1 -2 days if possible, if required to be off any further amount of time we will need to bridge with Lovenox.    Rise Patience, DO Family Medicine Teaching Service

## 2020-06-15 NOTE — Telephone Encounter (Signed)
Patient has been notified about holding her Eliquis and has expressed understanding.

## 2020-06-15 NOTE — Telephone Encounter (Signed)
I received this staff message from the patient's medical clinic physician about holding Eliquis before the 06/29/20 colonoscopy with me:  "Rise Patience, DO  Danis, Kirke Corin, MD Cc: Martyn Malay, MD Good morning,   I am one of the resident physicians with [facility] family medicine practice. Erin Coffey was seen in the office today in regards to a preop clearance for colonoscopy as she is currently on Eliquis. This patient has had a prior PE while bridging anticoagulation due to subtherapeutic dosing. Because of this, I recommend only pausing Eliquis for 1-2 days prior to procedure. If you feel that the patient needs a full 5 days off of Eliquis, we will need to bridge with Lovenox. Please let me know which you would prefer and I can communicate this to the patient.   Thank you,  Alana Lilland, DO "  ____________________  Please instruct the patient to take her last dose of Eliquis in the morning of 06/28/20, which is the day prior to the procedure.  One day hold will be sufficient given the circumstances.  I will reply to the PCP.   - HD

## 2020-06-18 ENCOUNTER — Telehealth: Payer: Self-pay | Admitting: *Deleted

## 2020-06-18 NOTE — Telephone Encounter (Signed)
Patient is calling to see if she should wait to get her booster shot- she has sciatica flare. Advised she may have SE- sore joints and muscles- she has decided to wait until she is feeling better- appointment canceled and she will reschedule when her pain level goes down.

## 2020-06-23 ENCOUNTER — Ambulatory Visit: Payer: Medicare Other

## 2020-06-27 ENCOUNTER — Encounter: Payer: Self-pay | Admitting: Gastroenterology

## 2020-06-29 ENCOUNTER — Encounter: Payer: Self-pay | Admitting: Gastroenterology

## 2020-06-29 ENCOUNTER — Other Ambulatory Visit: Payer: Self-pay

## 2020-06-29 ENCOUNTER — Ambulatory Visit (AMBULATORY_SURGERY_CENTER): Payer: Medicare Other | Admitting: Gastroenterology

## 2020-06-29 VITALS — BP 133/63 | HR 68 | Temp 96.8°F | Resp 17 | Ht 69.0 in | Wt 296.0 lb

## 2020-06-29 DIAGNOSIS — K5730 Diverticulosis of large intestine without perforation or abscess without bleeding: Secondary | ICD-10-CM

## 2020-06-29 DIAGNOSIS — R9389 Abnormal findings on diagnostic imaging of other specified body structures: Secondary | ICD-10-CM | POA: Diagnosis not present

## 2020-06-29 MED ORDER — SODIUM CHLORIDE 0.9 % IV SOLN: 500.0000 mL | Freq: Once | INTRAVENOUS | Status: DC

## 2020-06-29 NOTE — Progress Notes (Signed)
VS by SM     Called to room to assist during endoscopic procedure.  Patient ID and intended procedure confirmed with present staff. Received instructions for my participation in the procedure from the performing physician.

## 2020-06-29 NOTE — Patient Instructions (Signed)
YOU HAD AN ENDOSCOPIC PROCEDURE TODAY AT [facility]:   Refer to the procedure report that was given to you for any specific questions about what was found during the examination.  If the procedure report does not answer your questions, please call your gastroenterologist to clarify.  If you requested that your care partner not be given the details of your procedure findings, then the procedure report has been included in a sealed envelope for you to review at your convenience later.  YOU SHOULD EXPECT: Some feelings of bloating in the abdomen. Passage of more gas than usual.  Walking can help get rid of the air that was put into your GI tract during the procedure and reduce the bloating. If you had a lower endoscopy (such as a colonoscopy or flexible sigmoidoscopy) you may notice spotting of blood in your stool or on the toilet paper. If you underwent a bowel prep for your procedure, you may not have a normal bowel movement for a few days.  Please Note:  You might notice some irritation and congestion in your nose or some drainage.  This is from the oxygen used during your procedure.  There is no need for concern and it should clear up in a day or so.  SYMPTOMS TO REPORT IMMEDIATELY:   Following lower endoscopy (colonoscopy or flexible sigmoidoscopy):  Excessive amounts of blood in the stool  Significant tenderness or worsening of abdominal pains  Swelling of the abdomen that is new, acute  Fever of 100F or higher   For urgent or emergent issues, a gastroenterologist can be reached at any hour by calling [phone]. Do not use MyChart messaging for urgent concerns.    DIET:  We do recommend a small meal at first, but then you may proceed to your regular diet.  Drink plenty of fluids but you should avoid alcoholic beverages for 24 hours.  MEDICATIONS: Resume Eliquis (apixaban) at prior dose today.  Please see handouts given to you by your recovery nurse.  ACTIVITY:   You should plan to take it easy for the rest of today and you should NOT DRIVE or use heavy machinery until tomorrow (because of the sedation medicines used during the test).    FOLLOW UP: Our staff will call the number listed on your records 48-72 hours following your procedure to check on you and address any questions or concerns that you may have regarding the information given to you following your procedure. If we do not reach you, we will leave a message.  We will attempt to reach you two times.  During this call, we will ask if you have developed any symptoms of COVID 19. If you develop any symptoms (ie: fever, flu-like symptoms, shortness of breath, cough etc.) before then, please call [phone].  If you test positive for Covid 19 in the 2 weeks post procedure, please call and report this information to [name].    If any biopsies were taken you will be contacted by phone or by letter within the next 1-3 weeks.  Please call us at [phone] if you have not heard about the biopsies in 3 weeks.   Thank you for allowing [name] to provide for your healthcare needs today.   SIGNATURES/CONFIDENTIALITY: You and/or your care partner have signed paperwork which will be entered into your electronic medical record.  These signatures attest to the fact that that the information above on your After Visit Summary has been reviewed and is understood.  Full responsibility of the confidentiality of this discharge information lies with you and/or your care-partner.

## 2020-06-29 NOTE — Progress Notes (Signed)
pt tolerated well. VSS. awake and to recovery. Report given to RN.  

## 2020-06-29 NOTE — Op Note (Signed)
[facility] Patient Name: Erin Coffey Procedure Date: 06/29/2020 10:43 AM MRN: [id number] Endoscopist: [place]. Loletha Carrow , MD Age: 74 Referring MD:  Date of Birth: 1945-11-25 Gender: Female Account #: [id number] Procedure:                Colonoscopy Indications:              Abnormal CT of the GI tract (CTAP suggesting rectal                            wall thickening) Medicines:                Monitored Anesthesia Care Procedure:                Pre-Anesthesia Assessment:                           - Prior to the procedure, a History and Physical                            was performed, and patient medications and                            allergies were reviewed. The patient's tolerance of                            previous anesthesia was also reviewed. The risks                            and benefits of the procedure and the sedation                            options and risks were discussed with the patient.                            All questions were answered, and informed consent                            was obtained. Prior Anticoagulants: The patient has                            taken Eliquis (apixaban), last dose was 2 days                            prior to procedure. ASA Grade Assessment: III - A                            patient with severe systemic disease. After                            reviewing the risks and benefits, the patient was                            deemed in satisfactory condition to undergo the  procedure.                           After obtaining informed consent, the colonoscope                            was passed under direct vision. Throughout the                            procedure, the patient's blood pressure, pulse, and                            oxygen saturations were monitored continuously. The                            Colonoscope was introduced through the anus and                             advanced to the the cecum, identified by                            appendiceal orifice and ileocecal valve. The                            colonoscopy was performed without difficulty. The                            patient tolerated the procedure well. The quality                            of the bowel preparation was good. The ileocecal                            valve, appendiceal orifice, and rectum were                            photographed. Scope In: 10:56:29 AM Scope Out: 11:07:02 AM Scope Withdrawal Time: 0 hours 8 minutes 39 seconds  Total Procedure Duration: 0 hours 10 minutes 33 seconds  Findings:                 The perianal and digital rectal examinations were                            normal.                           Multiple small-mouthed diverticula were found in                            the left colon.                           The exam was otherwise without abnormality on  direct and retroflexion views. Complications:            No immediate complications. Estimated Blood Loss:     Estimated blood loss: none. Impression:               - Diverticulosis in the left colon.                           - The examination was otherwise normal on direct                            and retroflexion views.                           - No specimens collected. Recommendation:           - Patient has a contact number available for                            emergencies. The signs and symptoms of potential                            delayed complications were discussed with the                            patient. Return to normal activities tomorrow.                            Written discharge instructions were provided to the                            patient.                           - Resume previous diet.                           - Resume Eliquis (apixaban) at prior dose today.                           - Based on age and current guidelines,  no future                            routine screening colonoscopy recommended. Jase Himmelberger L. Loletha Carrow, MD 06/29/2020 11:11:57 AM This report has been signed electronically.

## 2020-07-02 DIAGNOSIS — M25562 Pain in left knee: Secondary | ICD-10-CM | POA: Diagnosis not present

## 2020-07-03 ENCOUNTER — Telehealth: Payer: Self-pay

## 2020-07-03 NOTE — Telephone Encounter (Signed)
  Follow up Call-  Call back number 06/29/2020  Post procedure Call Back phone  # [phone]  Permission to leave phone message Yes  Some recent data might be hidden     Patient questions:  Do you have a fever, pain , or abdominal swelling? No. Pain Score  0 *  Have you tolerated food without any problems? Yes.    Have you been able to return to your normal activities? Yes.    Do you have any questions about your discharge instructions: Diet   No. Medications  No. Follow up visit  No.  Do you have questions or concerns about your Care? No.  Actions: * If pain score is 4 or above: No action needed, pain <4.  1. Have you developed a fever since your procedure? no  2.   Have you had an respiratory symptoms (SOB or cough) since your procedure? no  3.   Have you tested positive for COVID 19 since your procedure no  4.   Have you had any family members/close contacts diagnosed with the COVID 19 since your procedure?  no   If yes to any of these questions please route to Joylene John, RN and Joella Prince, RN

## 2020-07-09 DIAGNOSIS — M1712 Unilateral primary osteoarthritis, left knee: Secondary | ICD-10-CM | POA: Diagnosis not present

## 2020-07-16 ENCOUNTER — Encounter: Payer: Self-pay | Admitting: Podiatry

## 2020-07-16 ENCOUNTER — Ambulatory Visit: Payer: Medicare Other | Admitting: Podiatry

## 2020-07-16 ENCOUNTER — Other Ambulatory Visit: Payer: Self-pay

## 2020-07-16 DIAGNOSIS — B351 Tinea unguium: Secondary | ICD-10-CM

## 2020-07-16 DIAGNOSIS — E119 Type 2 diabetes mellitus without complications: Secondary | ICD-10-CM

## 2020-07-16 DIAGNOSIS — M1712 Unilateral primary osteoarthritis, left knee: Secondary | ICD-10-CM | POA: Diagnosis not present

## 2020-07-16 DIAGNOSIS — M79674 Pain in right toe(s): Secondary | ICD-10-CM

## 2020-07-16 DIAGNOSIS — M79675 Pain in left toe(s): Secondary | ICD-10-CM

## 2020-07-22 NOTE — Progress Notes (Signed)
Subjective: Erin Coffey presents today for follow up of preventative diabetic foot care and painful thick toenails that are difficult to trim. Pain interferes with ambulation. Aggravating factors include wearing enclosed shoe gear. Pain is relieved with periodic professional debridement.   She voices no new pedal problems on today's visit.  PCP is Dr. [name] and last visit was  04/30/2020.  Allergies  Allergen Reactions  . Lisinopril Cough     Objective: There were no vitals filed for this visit.  Vascular Examination:  Capillary fill time to digits <3s b/l, palpable DP pulses b/l, palpable PT pulses b/l, pedal hair absent b/l and skin temperature gradient within normal limits b/l  Dermatological Examination: Pedal skin with normal turgor, texture and tone bilaterally, no open wounds bilaterally, no interdigital macerations bilaterally and toenails 1-5 b/l elongated, dystrophic, thickened, crumbly with subungual debris  Musculoskeletal: Normal muscle strength 5/5 to all lower extremity muscle groups bilaterally, no gross bony deformities bilaterally and no pain crepitus or joint limitation noted with ROM b/l  Neurological: Protective sensation intact 5/5 intact bilaterally with 10g monofilament b/l and vibratory sensation intact b/l  Assessment: 1. Pain due to onychomycosis of toenails of both feet   2. Controlled type 2 diabetes mellitus without complication, without long-term current use of insulin ([place])     Plan: -Examined patient. -No new findings. No new orders. -Continue diabetic foot care principles. -Toenails 1-5 b/l were debrided in length and girth with sterile nail nippers and dremel without iatrogenic bleeding.  -Patient to report any pedal injuries to medical professional immediately. -Patient/POA to call should there be question/concern in the interim.  Return in about 3 months (around 10/14/2020) for diabetic foot care.

## 2020-07-23 DIAGNOSIS — M1712 Unilateral primary osteoarthritis, left knee: Secondary | ICD-10-CM | POA: Diagnosis not present

## 2020-08-16 ENCOUNTER — Other Ambulatory Visit: Payer: Self-pay | Admitting: Family Medicine

## 2020-08-16 DIAGNOSIS — G8929 Other chronic pain: Secondary | ICD-10-CM

## 2020-08-16 DIAGNOSIS — M545 Low back pain, unspecified: Secondary | ICD-10-CM

## 2020-08-18 ENCOUNTER — Ambulatory Visit: Payer: Medicare Other | Attending: Internal Medicine

## 2020-08-18 ENCOUNTER — Telehealth: Payer: Self-pay | Admitting: Family Medicine

## 2020-08-18 ENCOUNTER — Other Ambulatory Visit: Payer: Self-pay | Admitting: Family Medicine

## 2020-08-18 DIAGNOSIS — Z23 Encounter for immunization: Secondary | ICD-10-CM

## 2020-08-18 DIAGNOSIS — G8929 Other chronic pain: Secondary | ICD-10-CM

## 2020-08-18 DIAGNOSIS — M545 Low back pain, unspecified: Secondary | ICD-10-CM

## 2020-08-18 NOTE — Progress Notes (Signed)
   Covid-19 Vaccination Clinic  Name:  VALENTINE KUECHLE    MRN: [id number] DOB: 1946/07/06  08/18/2020  Ms. Shipman was observed post Covid-19 immunization for 15 minutes without incident. She was provided with Vaccine Information Sheet and instruction to access the V-Safe system.   Ms. Okeefe was instructed to call 911 with any severe reactions post vaccine: [name] Difficulty breathing  . Swelling of face and throat  . A fast heartbeat  . A bad rash all over body  . Dizziness and weakness   Immunizations Administered    Name Date Dose VIS Date Route   Pfizer COVID-19 Vaccine 08/18/2020  9:33 AM 0.3 mL 05/30/2020 Intramuscular   Manufacturer: [place]   Lot: [id number]   NDC: [address]-8

## 2020-08-18 NOTE — Telephone Encounter (Signed)
**  After Hours/ Emergency Line Call**  Received a call to report that Erin Coffey who states she got her booster but tripped on her stairs and fell striking her knee and side of head. She did not lose consciousness but struck her head on the side of the storm door.  She states her head is not currently hurting, and that she no dizziness but states she is does have a "big knot" on her knee which painful to palpation but does not hurt to walk on it. She states she lives alone.  I spoke to the patient in detail about the fact that due to the COVID-19 pandemic we want to keep anyone out of the urgent care or emergency department that we can particularly to reduce the risk of them get an infection from COVID-19 but due to the fact that she fell and struck her head and is on a blood thinner, and lives alone with no one to monitor her I would feel more comfortable she was evaluated in person.   Patient expressed her understanding of this and stated that she would go to urgent care for an evaluation.  I discussed with the patient that if she develops any dizziness, vomiting, confusion, or other neurologic symptoms that she should immediately call 911 and go to the emergency department but that otherwise I feel that an evaluation in the urgent care would be reasonable.  Red flags discussed.  Will forward to PCP.  Lurline Del, DO PGY-2, [facility] 08/18/2020 10:30 AM

## 2020-08-28 ENCOUNTER — Telehealth (INDEPENDENT_AMBULATORY_CARE_PROVIDER_SITE_OTHER): Payer: Medicare Other | Admitting: Family Medicine

## 2020-08-28 ENCOUNTER — Encounter: Payer: Self-pay | Admitting: Family Medicine

## 2020-08-28 DIAGNOSIS — M159 Polyosteoarthritis, unspecified: Secondary | ICD-10-CM | POA: Diagnosis not present

## 2020-08-28 DIAGNOSIS — M545 Low back pain, unspecified: Secondary | ICD-10-CM

## 2020-08-28 DIAGNOSIS — I10 Essential (primary) hypertension: Secondary | ICD-10-CM

## 2020-08-28 DIAGNOSIS — G8929 Other chronic pain: Secondary | ICD-10-CM

## 2020-08-28 MED ORDER — LOSARTAN POTASSIUM 50 MG PO TABS: 50.0000 mg | ORAL_TABLET | Freq: Every day | ORAL | Status: DC

## 2020-08-28 MED ORDER — TRAMADOL HCL 50 MG PO TABS: 50.0000 mg | ORAL_TABLET | Freq: Four times a day (QID) | ORAL | 0 refills | Status: DC | PRN

## 2020-08-28 NOTE — Progress Notes (Signed)
[facility] Visit  Patient consented to have virtual visit. Method of visit: Telephone  Encounter participants: Patient: Erin Coffey - located at home  Provider: [name] - located at home Others (if applicable):   Chief Complaint: medication refill   HPI:  Erin Coffey is a 75 y.o. female presents for medication refill. Patient was notified to contact her PCP regarding her last Tramadol refill.  Patient takes Tramadol for arthritis. Standing up for long periods especially when she is serving at [employer].   Patient states she fell on 08/18/20 the same day she got shots at her orthopedist in her left knee. Denies any injury and was able to get up immediately after the fall. She iced the knee that provided pain relief.     ROS: per HPI  Pertinent PMHx: OA  Exam:  Respiratory: speaking in full sentences without pause.   Assessment/Plan:  Osteoarthritis, multiple sites PDMP reviewed. No concern for abuse at this time.  Tramadol rx sent.   HYPERTENSION, BENIGN ESSENTIAL BP at goal. Refill Losartan sent to pharmacy     Time spent during visit with patient: 12 minutes

## 2020-08-28 NOTE — Assessment & Plan Note (Signed)
BP at goal. Refill Losartan sent to pharmacy

## 2020-08-28 NOTE — Assessment & Plan Note (Signed)
PDMP reviewed. No concern for abuse at this time.  Tramadol rx sent.

## 2020-08-29 ENCOUNTER — Other Ambulatory Visit: Payer: Self-pay | Admitting: Family Medicine

## 2020-08-29 ENCOUNTER — Telehealth: Payer: Self-pay

## 2020-08-29 DIAGNOSIS — I10 Essential (primary) hypertension: Secondary | ICD-10-CM

## 2020-08-29 MED ORDER — LOSARTAN POTASSIUM 50 MG PO TABS: 50.0000 mg | ORAL_TABLET | Freq: Every day | ORAL | 5 refills | Status: DC

## 2020-08-29 NOTE — Progress Notes (Signed)
Refill Losartan sent to pharmacy.

## 2020-08-29 NOTE — Telephone Encounter (Signed)
Patient calls nurse line stating [facility] never received her Losartan RX. I see where it was set to print. I do not see a quantity, therefore will forward to PCP.

## 2020-09-27 DIAGNOSIS — E1122 Type 2 diabetes mellitus with diabetic chronic kidney disease: Secondary | ICD-10-CM | POA: Diagnosis not present

## 2020-09-27 DIAGNOSIS — N2581 Secondary hyperparathyroidism of renal origin: Secondary | ICD-10-CM | POA: Diagnosis not present

## 2020-09-27 DIAGNOSIS — R809 Proteinuria, unspecified: Secondary | ICD-10-CM | POA: Diagnosis not present

## 2020-09-27 DIAGNOSIS — N183 Chronic kidney disease, stage 3 unspecified: Secondary | ICD-10-CM | POA: Diagnosis not present

## 2020-09-27 DIAGNOSIS — N189 Chronic kidney disease, unspecified: Secondary | ICD-10-CM | POA: Diagnosis not present

## 2020-09-27 DIAGNOSIS — I129 Hypertensive chronic kidney disease with stage 1 through stage 4 chronic kidney disease, or unspecified chronic kidney disease: Secondary | ICD-10-CM | POA: Diagnosis not present

## 2020-09-27 DIAGNOSIS — D631 Anemia in chronic kidney disease: Secondary | ICD-10-CM | POA: Diagnosis not present

## 2020-10-16 ENCOUNTER — Ambulatory Visit: Payer: Medicare Other | Admitting: Podiatry

## 2020-10-16 ENCOUNTER — Other Ambulatory Visit: Payer: Self-pay

## 2020-10-16 ENCOUNTER — Encounter: Payer: Self-pay | Admitting: Podiatry

## 2020-10-16 DIAGNOSIS — B351 Tinea unguium: Secondary | ICD-10-CM

## 2020-10-16 DIAGNOSIS — M79674 Pain in right toe(s): Secondary | ICD-10-CM

## 2020-10-16 DIAGNOSIS — M79675 Pain in left toe(s): Secondary | ICD-10-CM | POA: Diagnosis not present

## 2020-10-16 DIAGNOSIS — E119 Type 2 diabetes mellitus without complications: Secondary | ICD-10-CM | POA: Diagnosis not present

## 2020-10-16 DIAGNOSIS — B353 Tinea pedis: Secondary | ICD-10-CM | POA: Diagnosis not present

## 2020-10-16 MED ORDER — KETOCONAZOLE 2 % EX CREA: TOPICAL_CREAM | CUTANEOUS | 1 refills | Status: DC

## 2020-10-16 NOTE — Patient Instructions (Signed)

## 2020-10-16 NOTE — Progress Notes (Signed)
ANNUAL DIABETIC FOOT EXAM  Subjective: Erin Coffey presents today for for annual diabetic foot examination and painful thick toenails that are difficult to trim. Pain interferes with ambulation. Aggravating factors include wearing enclosed shoe gear. Pain is relieved with periodic professional debridement.  Patient relates 18 year h/o diabetes.  Patient relates no h/o foot wounds.  Patient does symptoms of foot numbness, tingling and burning managed with gabapentin.  Patient did not check blood glucose this morning.   Erin Hensen, DO is patient's PCP. Last visit was January, 2022.  Past Medical History:  Diagnosis Date  . Allergy   . Anemia, iron deficiency   . ANXIETY, SITUATIONAL 73/27/2010   Qualifier: Diagnosis of  By: Erin Sax  Coffey, Col[place]ta [place]    . Arthritis   . Back pain, thoracic 12/20/2012  . Carpal tunnel syndrome 12/07/2014  . Cholelithiasis 06/25/2012  . Chronic kidney disease   . DM type 2 goal A1C below 7.5 06/05/2008   Qualifier: Diagnosis of  By: Erin Sax  Coffey, Colletta [place]    . GASTROESOPHAGEAL REFLUX, NO ESOPHAGITIS 10/08/2006   Qualifier: Diagnosis of  By: Erin Coffey    . Goiter, unspecified 10/21/2007   Qualifier: Diagnosis of  By: Erin Morn Coffey, [name]    . History of DVT (deep vein thrombosis) 05/05/2013   on Eliquis  . HYPERLIPIDEMIA 08/08/2008   Qualifier: Diagnosis of  By: Erin Sax  Coffey, Colletta [place]    . HYPERTENSION, BENIGN ESSENTIAL 11/27/2006   Qualifier: Diagnosis of  By: Erin Morn Coffey, [place]    . Mild coronary artery disease 10/2019   Coronary CT Angiogram: Coronary calcium score 46.Normal coronary origin.  Left dominance with tortuous coronary arteries.  Nondominant RCA - prox < 25% followed by ectasia (dilated to 5.5 mm) mixed plaque in distal vessel.  LM- normal . LAD: minimal mixed plaque in ostial & proximal (<25%) with brief IM bridging in mLAD. Mild mid-distal (25-49%), RI - prox <25%, Large LCx- minimal (<25%) mid-dista  . Mild coronary artery disease   . Personal  history of colonic polyps 10/24/2009   Qualifier: Diagnosis of  By: Erin Ina Coffey, Erin Coffey   . Pica 08/08/2009   Qualifier: Diagnosis of  By: Erin Coffey    . Polymyalgia rheumatica ([place]) 10/02/2009   Qualifier: Diagnosis of  By: Erin Coffey, Erin Coffey    . Pulmonary embolism ([place]) - On long Term anticoagulation (Z79.01) 04/28/2011   Converted from warfarin to Eliquis; on long-term anticoagulation.  . Thyroid disease   . Trigger ring finger of left hand 11/02/2014   Patient Active Problem List   Diagnosis Date Noted  . Sciatic nerve pain, left 05/30/2020  . Lower abdominal pain 04/30/2020  . Boil of buttock 04/19/2020  . Mild coronary artery disease 10/2019  . History of spinal fracture 03/24/2019  . Age-related osteoporosis with current pathological fracture 73/13/2020  . Nontraumatic complete tear of left rotator cuff 03/24/2019  . Anginal chest pain at rest Erin Coffey) 03/24/2019  . Primary hypercoagulable state ([name]) 03/07/2019  . Osteoporotic compression fracture of spine ([place]) 03/07/2019  . Pulmonary emboli ([place]) 73/23/2020  . Chronic left hip pain 73/02/2019  . Left sided numbness 73/07/2018  . Onychogryphosis 73/21/2019  . Tinea pedis of right foot 73/21/2019  . Wheezing 73/21/2019  . Low back pain potentially associated with radiculopathy 73/21/2019  . Diverticulosis of colon 73/14/2018  . Hypertensive retinopathy of right eye 73/07/2017  . Hypermetropia of both eyes 73/07/2017  . Back pain 73/28/2018  . Chronic kidney disease 73/19/2017  . Normocytic  anemia 73/19/2017  . Dark stools 73/05/2016  . Rotator cuff dysfunction 73/14/2016  . Bruise 73/28/2016  . Long term (current) use of anticoagulants 73/27/2012  . Hyperlipidemia associated with type 2 diabetes mellitus ([name]) 73/29/2009  . Type 2 diabetes mellitus with hemoglobin A1c goal of less than 7.5% (Erin Coffey) 73/26/2009  . HYPERTENSION, BENIGN ESSENTIAL 73/18/2008  . GASTROESOPHAGEAL REFLUX, NO ESOPHAGITIS 73/28/2008  .  Osteoarthritis, multiple sites 73/28/2008   Past Surgical History:  Procedure Laterality Date  . ABDOMINAL HYSTERECTOMY    . CARDIAC CATHETERIZATION  04/29/11   (Dr. Lia Foyer [facility]): LM - normal. LAD with 1 major Diag - normal, bifurcating Ramus - normal, Large (6-7 mm) Dominant LCx with 1 OM, 2 LPL branches & LPDA - non significant disease; small non-dominant RCA.    [name] CARDIAC EVENT MONITOR  09/2019   Mostly sinus rhythm, average rate 60 bpm.  Range 45 to 190 bpm.  No critical events indicating any arrhythmias or pauses.  For patient responses noted PVCs.  Less than 1% PVCs noted.   [name] CARPAL TUNNEL RELEASE Right 1996  . COLONOSCOPY    . KNEE ARTHROSCOPY  Bilateral  . TRANSTHORACIC ECHOCARDIOGRAM  09/2019   Normal EF - 60 to 65%.  Mild LVH.  GR 1 DD.  Normal RV.  Trivial MR.  Normal IVC.  Normal PA pressures.  . TUBAL LIGATION    . UPPER GASTROINTESTINAL ENDOSCOPY     Current Outpatient Medications on File Prior to Visit  Medication Sig Dispense Refill  . acetaminophen (TYLENOL) 650 MG CR tablet Take 1,300 mg by mouth every 8 (eight) hours as needed for pain.    [name] alendronate (FOSAMAX) 70 MG tablet TAKE 1 TABLET BY MOUTH  EVERY 7 DAYS WITH A FULL  GLASS OF WATER ON AN EMPTY  STOMACH 12 tablet 3  . amLODipine (NORVASC) 5 MG tablet Take 1 tablet (5 mg total) by mouth at bedtime. 90 tablet 3  . apixaban (ELIQUIS) 5 MG TABS tablet Take 1 tablet (5 mg total) by mouth 2 (two) times daily. Begin after completing starterpack for eliquis 180 tablet 3  . atorvastatin (LIPITOR) 40 MG tablet Take 1 tablet (40 mg total) by mouth daily. 90 tablet 3  . baclofen (LIORESAL) 10 MG tablet Take 1 tablet (10 mg total) by mouth daily as needed for muscle spasms. 30 each 0  . Blood Glucose Monitoring Suppl (ONETOUCH VERIO) w/Device KIT Use as instructed to test sugars once daily.  Dx Code: E11.9 1 kit 0  . Calcium Carb-Cholecalciferol (CALCIUM 500+D3) 500-400 MG-UNIT TABS Take 1 tablet by mouth daily.      . diclofenac Sodium (VOLTAREN) 1 % GEL Apply 2 g topically at bedtime. Apply to shoulder    . ELDERBERRY PO Take 2 g by mouth daily at 12 noon.    . fish oil-omega-3 fatty acids 1000 MG capsule Take 1 g by mouth daily.     [name] gabapentin (NEURONTIN) 100 MG capsule TAKE 1 CAPSULE BY MOUTH  TWICE DAILY 180 capsule 3  . glucose blood (ONETOUCH VERIO) test strip Use as instructed to test sugars once daily.  Dx Code: E11.9 100 each 12  . glucose blood test strip Use as instructed 100 each 12  . iron polysaccharides (FERREX 150) 150 MG capsule Take 1 capsule (150 mg total) by mouth daily. 60 capsule 5  . ketoconazole (NIZORAL) 2 % cream Apply 1 application topically daily as needed for irritation. Apply to rash area as needed    .  Lancet Devices (ONE TOUCH DELICA LANCING DEV) MISC Use as instructed to test sugars once daily.  Dx Code: E11.9 1 each 0  . losartan (COZAAR) 100 MG tablet Take 100 mg by mouth daily.    [name] losartan (COZAAR) 50 MG tablet Take 1 tablet (50 mg total) by mouth at bedtime. 30 tablet 5  . metoprolol tartrate (LOPRESSOR) 25 MG tablet TAKE 1 TABLET BY MOUTH  TWICE DAILY 180 tablet 3  . mupirocin cream (BACTROBAN) 2 % Apply 1 application topically 2 (two) times daily. (Patient not taking: Reported on 06/29/2020) 15 g 0  . omeprazole (PRILOSEC) 20 MG capsule Take 20 mg by mouth daily.    Glory Rosebush Delica Lancets 41P MISC Use as instructed to test sugars once daily.  Dx Code: E11.9 100 each 12  . polyethylene glycol powder (GLYCOLAX/MIRALAX) powder Take 17 g by mouth 2 (two) times daily as needed. (Patient taking differently: Take 17 g by mouth daily as needed (constipation). ) 3350 g 1  . Simethicone (GAS-X EXTRA STRENGTH) 125 MG CAPS Take 1 capsule (125 mg total) by mouth daily as needed. 28 capsule 0  . traMADol (ULTRAM) 50 MG tablet Take 1 tablet (50 mg total) by mouth every 6 (six) hours as needed. 30 tablet 0  . triamcinolone cream (KENALOG) 0.1 % Apply 1 application topically  daily as needed. Apply to rash     Current Facility-Administered Medications on File Prior to Visit  Medication Dose Route Frequency Provider Last Rate Last Admin  . 0.9 %  sodium chloride infusion  500 mL Intravenous Once Doran Stabler, Coffey        Allergies  Allergen Reactions  . Lisinopril Cough   Social History   Occupational History  . Occupation: Retired- Airline pilot: [employer]  Tobacco Use  . Smoking status: Never Smoker  . Smokeless tobacco: Never Used  Vaping Use  . Vaping Use: Never used  Substance and Sexual Activity  . Alcohol use: No    Alcohol/week: 0.0 standard drinks  . Drug use: No  . Sexual activity: Not Currently   Family History  Problem Relation Age of Onset  . Diabetes Mother   . Heart disease Mother   . Hypertension Mother   . Heart disease Father   . Hypertension Father   . Diabetes Sister   . Heart disease Sister   . Hypertension Brother   . Cancer Sister 53       lukemia  . Cirrhosis Sister        alcohol  . Kidney disease Sister   . Diabetes Sister   . Diabetes Brother   . Colon cancer Neg Hx   . Pancreatic cancer Neg Hx   . Esophageal cancer Neg Hx    Immunization History  Administered Date(s) Administered  . Fluad Quad(high Dose 65+) 04/19/2020  . Influenza Split 05/15/2011, 04/29/2012  . Influenza Whole 06/05/2008, 08/08/2009  . Influenza,inj,Quad PF,6+ Mos 05/05/2013, 06/08/2014, 04/19/2015, 04/29/2016, 08/21/2016, 04/09/2017, 05/31/2018, 05/05/2019  . PFIZER(Purple Top)SARS-COV-2 Vaccination 10/01/2019, 10/25/2019, 08/18/2020  . Pneumococcal Conjugate-13 05/05/2013  . Pneumococcal Polysaccharide-23 08/24/2014  . Td 02/08/2002  . Tdap 01/20/2013  . Zoster 12/20/2012     Review of Systems: Negative except as noted in the HPI.  Objective: There were no vitals filed for this visit.  JANEYA DEYO is a pleasant 75 y.o. African American female in NAD. AAO X 3.  Vascular Examination: Capillary fill time to  digits <3 seconds b/l  lower extremities. Palpable pedal pulses b/l Erin. Pedal hair absent. Lower extremity skin temperature gradient within normal limits.  Dermatological Examination: Pedal skin with normal turgor, texture and tone bilaterally. No open wounds bilaterally. No interdigital macerations bilaterally. Toenails 1-5 b/l elongated, discolored, dystrophic, thickened, crumbly with subungual debris and tenderness to dorsal palpation. Diffuse scaling noted peripherally and plantarly b/l feet with mild foot odor.  No interdigital macerations.  No blisters, no weeping. No signs of secondary bacterial infection noted.  Musculoskeletal Examination: Normal muscle strength 5/5 to all lower extremity muscle groups bilaterally. No pain crepitus or joint limitation noted with ROM b/l. No gross bony deformities bilaterally.  Footwear Assessment: Does the patient wear appropriate shoes? Yes. Does the patient need inserts/orthotics? No.  Neurological Examination: Pt has subjective symptoms of neuropathy. Protective sensation intact 5/5 intact bilaterally with 10g monofilament b/l. Vibratory sensation intact b/l.  Hemoglobin A1C Latest Ref Rng & Units 04/19/2020 01/24/2020  HGBA1C 0.0 - 7.0 % 6.1 6.3  Some recent data might be hidden   Assessment: 1. Pain due to onychomycosis of toenails of both feet   2. Tinea pedis of both feet   3. Encounter for diabetic foot exam ([place])   4. Controlled type 2 diabetes mellitus without complication, without long-term current use of insulin ([facility])     ADA Risk Categorization: Low Risk :  Patient has all of the following: Intact protective sensation No prior foot ulcer  No severe deformity Pedal pulses present  Plan: -Examined patient. -No new findings. No new orders. -Diabetic foot examination performed on today's visit. -Patient to continue soft, supportive shoe gear daily. -Toenails 1-5 b/l were debrided in length and girth with sterile nail nippers and  dremel without iatrogenic bleeding.  -Patient to report any pedal injuries to medical professional immediately. -For tinea pedis, prescription sent to pharmacy for Ketoconazole Cream 2% to be applied to both feet and between toes qd x 4  weeks. -Patient/POA to call should there be question/concern in the interim.  Return in about 3 months (around 01/16/2021).  Marzetta Board, DPM

## 2020-10-18 DIAGNOSIS — N1832 Chronic kidney disease, stage 3b: Secondary | ICD-10-CM | POA: Diagnosis not present

## 2020-10-29 ENCOUNTER — Encounter: Payer: Self-pay | Admitting: Family Medicine

## 2020-10-29 ENCOUNTER — Other Ambulatory Visit: Payer: Self-pay

## 2020-10-29 ENCOUNTER — Ambulatory Visit (INDEPENDENT_AMBULATORY_CARE_PROVIDER_SITE_OTHER): Payer: Medicare Other | Admitting: Family Medicine

## 2020-10-29 VITALS — BP 147/67 | HR 64 | Ht 69.0 in | Wt 297.0 lb

## 2020-10-29 DIAGNOSIS — E119 Type 2 diabetes mellitus without complications: Secondary | ICD-10-CM | POA: Diagnosis not present

## 2020-10-29 DIAGNOSIS — R21 Rash and other nonspecific skin eruption: Secondary | ICD-10-CM

## 2020-10-29 LAB — POCT GLYCOSYLATED HEMOGLOBIN (HGB A1C): HbA1c, POC (controlled diabetic range): 6 % (ref 0.0–7.0)

## 2020-10-29 MED ORDER — ONETOUCH VERIO VI STRP: ORAL_STRIP | 12 refills | Status: DC

## 2020-10-29 MED ORDER — ONETOUCH DELICA LANCING DEV MISC: 0 refills | Status: DC

## 2020-10-29 MED ORDER — TRIAMCINOLONE ACETONIDE 0.1 % EX CREA
1.0000 | TOPICAL_CREAM | Freq: Every day | CUTANEOUS | 1 refills | Status: DC | PRN
Start: 2020-10-29 — End: 2021-09-30

## 2020-10-29 NOTE — Assessment & Plan Note (Signed)
Diet controlled. A1c today 6.0 and previously 6.1. Encouraged continued diet rich in vegetables and complex carbs.  Heart healthy carb modified diet. Counseled on need to continue exercising. Refilled Lancets and strips.  Statin therapy: Lipitor ACEi/ARB: Losartan Foot exam: UTD Urine microalbumine: follows with  Eye exam: UTD Pneumococcal vaccine: UTD

## 2020-10-29 NOTE — Progress Notes (Signed)
   SUBJECTIVE:   CHIEF COMPLAINT / HPI:   Chief Complaint  Patient presents with  . Follow-up    a1c     Erin Coffey is a 75 y.o. female here for diabetes follow up.   Diabetes Mellitus  Eats 1 meals a day. Walks for exercise and trys to perform some seated resistance workouts.  Denies increased thirst, hunger, or frequent urination. Last time she checked her blood sugar was about 2 weeks ago. States her blood sugar was 124. She needs more strips and lancets.   She is traveling to [place] soon to visit family.     PERTINENT  PMH / PSH: reviewed and updated as appropriate   OBJECTIVE:   BP (!) 147/67   Pulse 64   Ht 5\' 9"  (1.753 m)   Wt 297 lb (134.7 kg)   SpO2 99%   BMI 43.86 kg/m    GEN: pleasant well appearing femal, in no acute distress  CV: regular rate and rhythm, no murmurs appreciated  RESP: no increased work of breathing, clear to ascultation bilaterally  SKIN: warm, dry    ASSESSMENT/PLAN:   Type 2 diabetes mellitus with hemoglobin A1c goal of less than 7.5% (HCC) Diet controlled. A1c today 6.0 and previously 6.1. Encouraged continued diet rich in vegetables and complex carbs.  Heart healthy carb modified diet. Counseled on need to continue exercising. Refilled Lancets and strips.  Statin therapy: Lipitor ACEi/ARB: Losartan Foot exam: UTD Urine microalbumine: follows with  Eye exam: UTD Pneumococcal vaccine: UTD      [name], DO   PGY-2, [facility] 10/29/2020

## 2020-10-29 NOTE — Patient Instructions (Signed)
  It was great seeing you today!  Please check-out at the front desk before leaving the clinic. I'd like to see you back in 3 months but if you need to be seen earlier than that for any new issues we're happy to fit you in, just give [name] a call!  Visit Remembers: - Stop by the pharmacy to pick up your prescriptions - Continue to work on your healthy eating habits and incorporating exercise into your daily life. (see below) - Your goal is to have an A1c <8    Diet Recommendations for Diabetes  Carbohydrate includes starch, sugar, and fiber.  Of these, only sugar and starch raise blood glucose.  (Fiber is found in fruits, vegetables [especially skin, seeds, and stalks] and whole grains.)   Starchy (carb) foods: Bread, rice, pasta, potatoes, corn, cereal, grits, crackers, bagels, muffins, all baked goods.  (Fruit, milk, and yogurt also have carbohydrate, but most of these foods will not spike your blood sugar as most starchy foods will.)  A few fruits do cause high blood sugars; use small portions of bananas (limit to 1/2 at a time), grapes, watermelon, oranges, and most tropical fruits.   Protein foods: Meat, fish, poultry, eggs, dairy foods, and beans such as pinto and kidney beans (beans also provide carbohydrate).   1. Eat at least REAL 3 meals and 1-2 snacks per day. Never go more than 4-5 hours while awake without eating. Eat breakfast within the first hour of getting up.   2. Limit starchy foods to TWO per meal and ONE per snack. ONE portion of a starchy food is equal to the following:   - ONE slice of bread (or its equivalent, such as half of a hamburger bun).   - 1/2 cup of a "scoopable" starchy food such as potatoes or rice.   - 15 grams of Total Carbohydrate as shown on food label.   - Every 4 ounces of a sweet drink (including fruit juice). 3. Include at every meal: a protein food, a carb food, and vegetables and/or fruit.   - Obtain twice the volume of veg's as protein or carbohydrate  foods for both lunch and dinner.   - Fresh or frozen veg's are best.   - Keep frozen veg's on hand for a quick vegetable serving.       Please bring all of your medications with you to each visit.    If you haven't already, sign up for My Chart to have easy access to your labs results, and communication with your primary care physician.  Feel free to call with any questions or concerns at any time, at [phone].   Take care,  Dr. Rushie [facility]

## 2020-10-30 IMAGING — CT CT ANGIOGRAPHY CHEST
2 of 5 series · 3 of 16 positions shown · IV contrast (omnipaque)
Comparison: 04/30/2011

CLINICAL DATA: Chest pressure for 2 days, history of prior
pulmonary embolism

EXAM:
CT ANGIOGRAPHY CHEST WITH CONTRAST
TECHNIQUE: Multidetector CT imaging of the chest was performed using the
standard protocol during bolus administration of intravenous
contrast. Multiplanar CT image reconstructions and MIPs were
obtained to evaluate the vascular anatomy.
CONTRAST:  85mL OMNIPAQUE IOHEXOL 350 MG/ML SOLN

[Series 5: pe 2mm · axial · 0.71mm/px · z∈[+1310,+1620]mm · 2 of 156 slices shown]
[im 1/156  lung]
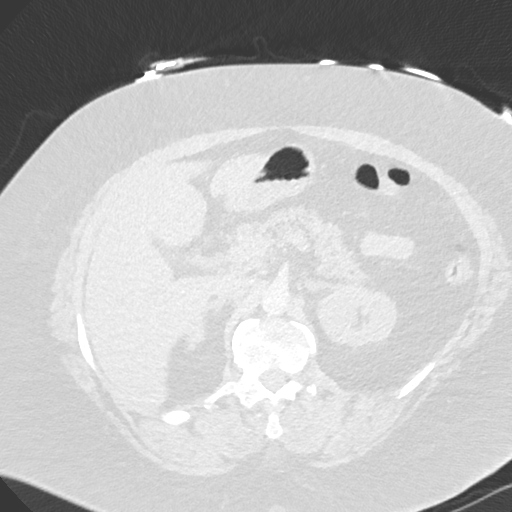
[im 156/156  soft-tissue]
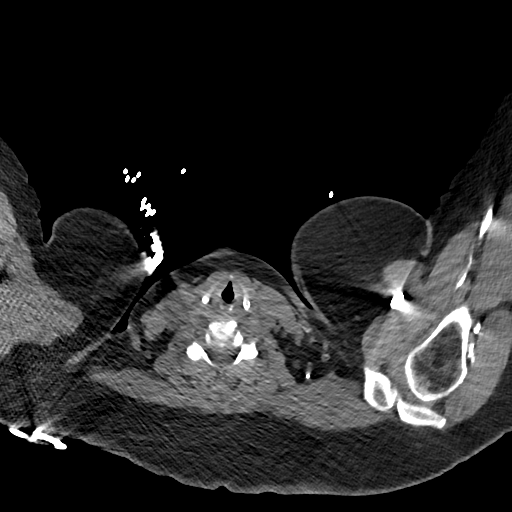

[Series 8: pe 2mm cor · coronal · 0.67mm/px · 1 of 179 slices shown]
[im 90/179  soft-tissue]
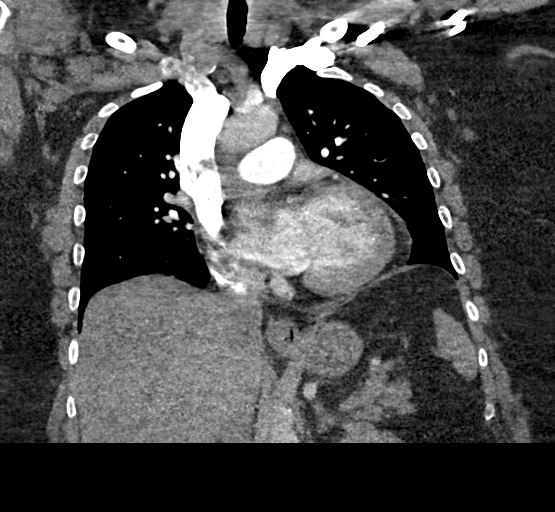

[3 of 16 positions shown; findings below may reference images not displayed]

FINDINGS: Cardiovascular: Thoracic aorta demonstrates atherosclerotic
calcifications without aneurysmal dilatation or evidence of
dissection. Heart is mildly enlarged in size, stable in appearance.
No significant coronary calcifications are seen. The pulmonary
artery shows a normal branching pattern bilaterally. Filling defects
are noted in the right lower lobe pulmonary arterial branch
consistent with mild pulmonary emboli. The overall embolus burden is
significantly less than that seen on prior exam. No evidence of
right heart strain is seen.

Mediastinum/Nodes: The esophagus is within normal limits. No hilar
or mediastinal adenopathy is seen. The thoracic inlet is within
normal.

Lungs/Pleura: The lungs are well aerated bilaterally. No focal
infiltrate or sizable effusion is seen. Focal parenchymal nodules
are noted.

Upper Abdomen: Visualized upper abdomen is within normal limits.

Musculoskeletal: Degenerative changes of the thoracic spine are
seen. No acute bony abnormality is noted.

Review of the MIP images confirms the above findings.
IMPRESSION: Mild pulmonary emboli in the right lower lobe without evidence of
right heart strain.

Aortic Atherosclerosis ([id number]).

## 2020-12-13 ENCOUNTER — Ambulatory Visit (INDEPENDENT_AMBULATORY_CARE_PROVIDER_SITE_OTHER): Payer: Medicare Other | Admitting: Family Medicine

## 2020-12-13 ENCOUNTER — Encounter: Payer: Self-pay | Admitting: Cardiology

## 2020-12-13 ENCOUNTER — Ambulatory Visit: Payer: Medicare Other | Admitting: Cardiology

## 2020-12-13 ENCOUNTER — Encounter: Payer: Self-pay | Admitting: Family Medicine

## 2020-12-13 ENCOUNTER — Other Ambulatory Visit: Payer: Self-pay

## 2020-12-13 VITALS — BP 134/72 | HR 63 | Ht 69.0 in | Wt 297.0 lb

## 2020-12-13 VITALS — BP 140/68 | HR 63 | Ht 69.0 in | Wt 298.4 lb

## 2020-12-13 DIAGNOSIS — E785 Hyperlipidemia, unspecified: Secondary | ICD-10-CM | POA: Diagnosis not present

## 2020-12-13 DIAGNOSIS — I10 Essential (primary) hypertension: Secondary | ICD-10-CM | POA: Diagnosis not present

## 2020-12-13 DIAGNOSIS — M25562 Pain in left knee: Secondary | ICD-10-CM

## 2020-12-13 DIAGNOSIS — I2510 Atherosclerotic heart disease of native coronary artery without angina pectoris: Secondary | ICD-10-CM

## 2020-12-13 DIAGNOSIS — N1831 Chronic kidney disease, stage 3a: Secondary | ICD-10-CM | POA: Diagnosis not present

## 2020-12-13 DIAGNOSIS — I2699 Other pulmonary embolism without acute cor pulmonale: Secondary | ICD-10-CM

## 2020-12-13 DIAGNOSIS — N184 Chronic kidney disease, stage 4 (severe): Secondary | ICD-10-CM | POA: Diagnosis not present

## 2020-12-13 DIAGNOSIS — E1169 Type 2 diabetes mellitus with other specified complication: Secondary | ICD-10-CM

## 2020-12-13 NOTE — Patient Instructions (Addendum)
It was great seeing you today!  Please check-out at the front desk before leaving the clinic. I'd like to see you back in 3 months but if you need to be seen earlier than that for any new issues we're happy to fit you in, just give [name] a call!  Visit Remembers: - Continue to work on your healthy eating habits and incorporating exercise into your daily life.  - Your goal is to have an BP < 135/85  - Medicine Changes: No medication changes at this time.  - To Do:  - Be sure to follow up with your orthopedic doctor about your knee pain. Take Tylenol 650 mg three to four times a day.  Continue taking Tramadol as needed for increased pain.   - Use your cane at home and lift your knee higher to clear the top porch step.     Regarding lab work today:  Due to recent changes in healthcare laws, you may see the results of your imaging and laboratory studies on MyChart before your provider has had a chance to review them.  I understand that in some cases there may be results that are confusing or concerning to you. Not all laboratory results come back in the same time frame and you may be waiting for multiple results in order to interpret others.  Please give [name] 72 hours in order for your provider to thoroughly review all the results before contacting the office for clarification of your results. If everything is normal, you will get a letter in the mail or a message in My Chart. Please give [name] a call if you do not hear from [name] after 2 weeks.  Please bring all of your medications with you to each visit.    If you haven't already, sign up for My Chart to have easy access to your labs results, and communication with your primary care physician.  Feel free to call with any questions or concerns at any time, at [phone].   Take care,  Dr. [facility]

## 2020-12-13 NOTE — Progress Notes (Signed)
Primary Care Provider: [name], DO Cardiologist: None Electrophysiologist: None  Clinic Note: Chief Complaint  Patient presents with  . Follow-up    No major cardiac issues.  Mostly bothered by musculoskeletal symptoms.  MSK pain; Problem list noted    ===================================  ASSESSMENT/PLAN   Problem List Items Addressed This Visit    Hyperlipidemia associated with type 2 diabetes mellitus ([place]) (Chronic)    Has not had labs since August.  Last LDL was 69.  This is on atorvastatin 40 mg daily.  Monitor closely.   Thankfully, coronary Score is relatively low risk, but her other risk factors are significant.  Should target LDL less than 100 if not closer to less than 70.  If remains stable, would just simply continue current dose of statin.  Hopefully with weight loss her levels will improve as well.      Morbid (severe) obesity due to excess calories (HCC) (Chronic)    With hypertension and hyperlipidemia, and diabetes, obesity meets the criteria metabolic syndrome.  Increased risk for cardiovascular disease.  She has done really well with weight loss, and then regained all the weight she had lost. Discussed dietary modification.  She needs for Erin Sidle do exercise that will hurt her joints.  Discussed potentially using exercise bicycle and water aerobics       HYPERTENSION, BENIGN ESSENTIAL (Chronic)    BP not quite at goal. Resting heart rate 63 bpm, therefore would not titrate beta-blocker, we do have room to increase amlodipine if pressures continue to be elevated.  Continue losartan 100 mg daily.  Monitor pressures with PCP or nephrology.  If pressures are still elevated, would increase amlodipine to 10 mg daily.      Relevant Orders   EKG 12-Lead (Completed)   Pulmonary emboli (HCC) - Primary (Chronic)    Converted from a warfarin to apixaban.  Now on stable dose of 5 mg twice daily apixaban.  No bleeding issues.      Mild coronary artery  disease (Chronic)    Mild CAD noted on coronary CTA with coronary calcium score 46.  Risk factor modification warranted:  Lipid, blood pressure and glycemic management.  Weight loss.      Relevant Orders   EKG 12-Lead (Completed)     ===================================  HPI:    Erin Coffey is a 75 y.o. female with a PMH notable for history of DVTs-PE, DM-2, CKD-3, along with nonocclusive CAD coronary CTA (March 2021), who presents today for annual follow-up.  Erin Coffey was last seen on November 24, 2019 via telemedicine in follow-up discussion for her Coronary CT Angiogram.  At that time her palpitations were notably improved.  She was walking 3 days a week at least a mile a day.  Limited mostly by MSK pain.  Noted increased dyspnea.  LDL was at 70.  BP controlled.  Controlled.  Recent Hospitalizations: N/A  Reviewed  CV studies:    The following studies were reviewed today: (if available, images/films reviewed: From Epic Chart or Care Everywhere) . N/A:   Interval History:   Erin Coffey returns here today overall doing fairly well.  Major issue relates to her osteoarthritis pains and occasional falls.  She fell recently and landed on her left side.  She says she often trips over her feet, or loses balance.  She has been trying to increase her exercise level and eating better, she had lost 25 pounds and then gained most of it back.  Unfortunately, she  is higher than she was last time.  Palpitations are pretty well controlled.  Not really bothering her that much.  She has intermittent episodes of left-sided and right-sided chest discomfort but not really associate with any particular activity or exertion.  CV Review of Symptoms (Summary):  positive for - Musculoskeletal chest pain, baseline exertional dyspnea because of deconditioning and obesity.  Trivial edema.  Palpitations well controlled. negative for - orthopnea, paroxysmal nocturnal dyspnea, rapid heart rate,  shortness of breath or Lightheadedness, dizziness or syncope/near syncope, TIA/amaurosis fugax, claudication.   REVIEWED OF SYSTEMS   Review of Systems  Constitutional: Negative for malaise/fatigue (Exercise intolerance, not really fatigued.) and weight loss (Had lost weight, and then gained it all back.).  HENT: Negative for congestion and nosebleeds.   Respiratory: Positive for shortness of breath (Baseline). Negative for cough and wheezing.   Gastrointestinal: Negative for abdominal pain, blood in stool and melena.  Genitourinary: Negative for hematuria.  Musculoskeletal: Positive for back pain, falls and joint pain (Knees and hips). Negative for myalgias.  Neurological: Negative for seizures and weakness.       Somewhat unsteady gait.  Poor balance.  Partly because of OA pain.  Psychiatric/Behavioral: Negative for depression and memory loss. The patient is nervous/anxious. The patient does not have insomnia.    I have reviewed and (if needed) personally updated the patient's problem list, medications, allergies, past medical and surgical history, social and family history.   PAST MEDICAL HISTORY   Past Medical History:  Diagnosis Date  . Allergy   . Anemia, iron deficiency   . ANXIETY, SITUATIONAL 04/06/2009   Qualifier: Diagnosis of  By: [name]  MD, Colletta Maryland    . Arthritis   . Back pain, thoracic 12/20/2012  . Carpal tunnel syndrome 12/07/2014  . Cholelithiasis 06/25/2012  . Chronic kidney disease   . DM type 2 goal A1C below 7.5 06/05/2008   Qualifier: Diagnosis of  By: [name]  MD, Colletta Maryland    . GASTROESOPHAGEAL REFLUX, NO ESOPHAGITIS 10/08/2006   Qualifier: Diagnosis of  By: [name]    . Goiter, unspecified 10/21/2007   Qualifier: Diagnosis of  By: [name] MD, [name]    . History of DVT (deep vein thrombosis) 05/05/2013   on Eliquis  . HYPERLIPIDEMIA 08/08/2008   Qualifier: Diagnosis of  By: [name]  MD, Colletta Maryland    . HYPERTENSION, BENIGN ESSENTIAL 11/27/2006   Qualifier:  Diagnosis of  By: [name] MD, [place]    . Mild coronary artery disease 10/2019   Coronary CT Angiogram: Coronary calcium score 46.Normal coronary origin.  Left dominance with tortuous coronary arteries.  Nondominant RCA - prox < 25% followed by ectasia (dilated to 5.5 mm) mixed plaque in distal vessel.  LM- normal . LAD: minimal mixed plaque in ostial & proximal (<25%) with brief IM bridging in mLAD. Mild mid-distal (25-49%), RI - prox <25%, Large LCx- minimal (<25%) mid-dista  . Mild coronary artery disease   . Personal history of colonic polyps 10/24/2009   Qualifier: Diagnosis of  By: [name] MD, Sandy Salaam   . Pica 08/08/2009   Qualifier: Diagnosis of  By: [name], [name]    . Polymyalgia rheumatica ([place]) 10/02/2009   Qualifier: Diagnosis of  By: [name] MD, Apolonio Schneiders    . Pulmonary embolism ([place]) - On long Term anticoagulation (Z79.01) 04/28/2011   Converted from warfarin to Eliquis; on long-term anticoagulation.  . Thyroid disease   . Trigger ring finger of left hand 11/02/2014    PAST SURGICAL HISTORY  Past Surgical History:  Procedure Laterality Date  . ABDOMINAL HYSTERECTOMY    . CARDIAC CATHETERIZATION  04/29/11   (Dr. Lia Foyer [facility]): LM - normal. LAD with 1 major Diag - normal, bifurcating Ramus - normal, Large (6-7 mm) Dominant LCx with 1 OM, 2 LPL branches & LPDA - non significant disease; small non-dominant RCA.    [name] CARDIAC EVENT MONITOR  09/2019   Mostly sinus rhythm, average rate 60 bpm.  Range 45 to 190 bpm.  No critical events indicating any arrhythmias or pauses.  For patient responses noted PVCs.  Less than 1% PVCs noted.   [name] CARPAL TUNNEL RELEASE Right 1996  . COLONOSCOPY    . KNEE ARTHROSCOPY  Bilateral  . TRANSTHORACIC ECHOCARDIOGRAM  09/2019   Normal EF - 60 to 65%.  Mild LVH.  GR 1 DD.  Normal RV.  Trivial MR.  Normal IVC.  Normal PA pressures.  . TUBAL LIGATION    . UPPER GASTROINTESTINAL ENDOSCOPY      Immunization History  Administered Date(s) Administered   . Fluad Quad(high Dose 65+) 04/19/2020  . Influenza Split 05/15/2011, 04/29/2012  . Influenza Whole 06/05/2008, 08/08/2009  . Influenza,inj,Quad PF,6+ Mos 05/05/2013, 06/08/2014, 04/19/2015, 04/29/2016, 08/21/2016, 04/09/2017, 05/31/2018, 05/05/2019  . PFIZER(Purple Top)SARS-COV-2 Vaccination 10/01/2019, 10/25/2019, 08/18/2020  . Pneumococcal Conjugate-13 05/05/2013  . Pneumococcal Polysaccharide-23 08/24/2014  . Td 02/08/2002  . Tdap 01/20/2013  . Zoster, Live 12/20/2012      MEDICATIONS/ALLERGIES   Current Meds  Medication Sig  . acetaminophen (TYLENOL) 650 MG CR tablet Take 1,300 mg by mouth every 8 (eight) hours as needed for pain.  [name] alendronate (FOSAMAX) 70 MG tablet TAKE 1 TABLET BY MOUTH  EVERY 7 DAYS WITH A FULL  GLASS OF WATER ON AN EMPTY  STOMACH (Patient taking differently: Take 70 mg by mouth once a week.)  . amLODipine (NORVASC) 5 MG tablet Take 1 tablet (5 mg total) by mouth at bedtime.  [name] apixaban (ELIQUIS) 5 MG TABS tablet Take 1 tablet (5 mg total) by mouth 2 (two) times daily. Begin after completing starterpack for eliquis (Patient taking differently: Take 5 mg by mouth 2 (two) times daily.)  . atorvastatin (LIPITOR) 40 MG tablet Take 1 tablet (40 mg total) by mouth daily.  . baclofen (LIORESAL) 10 MG tablet Take 1 tablet (10 mg total) by mouth daily as needed for muscle spasms.  . Blood Glucose Monitoring Suppl (ONETOUCH VERIO) w/Device KIT Use as instructed to test sugars once daily.  Dx Code: E11.9  . CHOLECALCIFEROL PO Take 2,200 Units by mouth daily.  [name] ELDERBERRY PO Take 2 g by mouth daily at 12 noon.  . fish oil-omega-3 fatty acids 1000 MG capsule Take 1 g by mouth daily.  [name] gabapentin (NEURONTIN) 100 MG capsule TAKE 1 CAPSULE BY MOUTH  TWICE DAILY (Patient taking differently: Take 100 mg by mouth 2 (two) times daily.)  . glucose blood (ONETOUCH VERIO) test strip Use as instructed to test sugars once daily.  Dx Code: E11.9  . ketoconazole (NIZORAL) 2 % cream  Apply to both feet once daily for 4 weeks.  Elmore Guise Devices (ONE TOUCH DELICA LANCING DEV) MISC Use as instructed to test sugars once daily.  Dx Code: E11.9  . losartan (COZAAR) 100 MG tablet Take 100 mg by mouth daily.  . metoprolol tartrate (LOPRESSOR) 25 MG tablet TAKE 1 TABLET BY MOUTH  TWICE DAILY (Patient taking differently: Take 25 mg by mouth 2 (two) times daily.)  . mupirocin cream (  BACTROBAN) 2 % Apply 1 application topically 2 (two) times daily.  [name] omeprazole (PRILOSEC) 20 MG capsule Take 20 mg by mouth daily.  Glory Rosebush Delica Lancets 53Z MISC Use as instructed to test sugars once daily.  Dx Code: E11.9  . polyethylene glycol powder (GLYCOLAX/MIRALAX) powder Take 17 g by mouth 2 (two) times daily as needed. (Patient taking differently: Take 17 g by mouth daily as needed (constipation).)  . traMADol (ULTRAM) 50 MG tablet Take 1 tablet (50 mg total) by mouth every 6 (six) hours as needed. (Patient taking differently: Take 50 mg by mouth every 6 (six) hours as needed for moderate pain.)  . triamcinolone (KENALOG) 0.1 % Apply 1 application topically daily as needed. Apply to rash  . vitamin C (ASCORBIC ACID) 500 MG tablet Take 500 mg by mouth daily.   Current Facility-Administered Medications for the 12/13/20 encounter (Office Visit) with Leonie Man, MD  Medication  . 0.9 %  sodium chloride infusion    Allergies  Allergen Reactions  . Lisinopril Cough    SOCIAL HISTORY/FAMILY HISTORY   Reviewed in Epic:  Pertinent findings:  Social History   Tobacco Use  . Smoking status: Never Smoker  . Smokeless tobacco: Never Used  Vaping Use  . Vaping Use: Never used  Substance Use Topics  . Alcohol use: No    Alcohol/week: 0.0 standard drinks  . Drug use: No   Social History   Social History Narrative   Patient lives alone in [place], she is a widow.   Patient has 3 children. Two daughters live locally, one son in [place].    Patient is very active in her church and  volunteer work to stay busy.    Patient enjoys cooking, spending time with family, and crossword puzzles.        OBJCTIVE -PE, EKG, labs   Wt Readings from Last 2 CV Encounters:  10/21/19  298 pounds (135.2 kg).  BMI 44.01     12/13/20 298 lb 6.4 oz (135.4 kg)    Physical Exam: BP 140/68   Pulse 63   Ht _0  (1.753 m)   Wt 298 lb 6.4 oz (135.4 kg)   SpO2 100%   BMI 44.07 kg/m  Physical Exam Vitals reviewed.  Constitutional:      General: She is not in acute distress.    Appearance: Normal appearance. She is not ill-appearing or toxic-appearing.     Comments: Well-groomed.  Morbidly obese.  HENT:     Head: Normocephalic and atraumatic.  Neck:     Vascular: No carotid bruit or JVD (Difficult to assess due to body habitus).  Cardiovascular:     Rate and Rhythm: Normal rate and regular rhythm. Occasional extrasystoles are present.    Chest Wall: PMI is not displaced.     Pulses: Decreased pulses (Decreased because of body habitus.).     Heart sounds: S1 normal and S2 normal. Heart sounds are distant. No murmur heard. No friction rub. No gallop.   Pulmonary:     Effort: Pulmonary effort is normal. No respiratory distress.     Breath sounds: Normal breath sounds. No wheezing, rhonchi or rales.  Chest:     Chest wall: Tenderness (Diffuse mild costosternal tenderness) present.  Musculoskeletal:        General: Swelling (Trivial) present. Normal range of motion.     Cervical back: Normal range of motion and neck supple.  Neurological:     General: No focal deficit present.  Mental Status: She is alert and oriented to person, place, and time. Mental status is at baseline.  Psychiatric:        Mood and Affect: Mood normal.        Behavior: Behavior normal.        Thought Content: Thought content normal.        Judgment: Judgment normal.     Adult ECG Report  Rate: 63 ;  Rhythm: normal sinus rhythm, premature ventricular contractions (PVC) and Otherwise normal axis,  intervals and durations.;   Narrative Interpretation: Stable  Recent Labs: Reviewed.  Due for upcoming labs. Lab Results  Component Value Date   CHOL 159 03/29/2020   HDL 80 03/29/2020   LDLCALC 69 03/29/2020   LDLDIRECT 98 02/02/2014   TRIG 49 03/29/2020   CHOLHDL 2.0 03/29/2020   Lab Results  Component Value Date   CREATININE 1.32 (H) 04/30/2020   BUN 15 04/30/2020   NA 142 04/30/2020   K 4.7 04/30/2020   CL 106 04/30/2020   CO2 22 04/30/2020   CBC Latest Ref Rng & Units 04/30/2020 09/01/2019 08/30/2019  WBC 3.4 - 10.8 x10E3/uL 6.6 5.1 3.9(L)  Hemoglobin 11.1 - 15.9 g/dL 11.3 11.8(L) 11.4(L)  Hematocrit 34.0 - 46.6 % 33.2(L) 36.9 35.5(L)  Platelets 150 - 450 x10E3/uL 223 216 192    ==================================================  COVID-19 Education: The signs and symptoms of COVID-19 were discussed with the patient and how to seek care for testing (follow up with PCP or arrange E-visit).   I spent a total of 2minutes with the patient spent in direct patient consultation.  Additional time spent with chart review  / charting (studies, outside notes, etc): 10 min Total Time: 36 min  Current medicines are reviewed at length with the patient today.  (+/- concerns) N/A  This visit occurred during the SARS-CoV-2 public health emergency.  Safety protocols were in place, including screening questions prior to the visit, additional usage of staff PPE, and extensive cleaning of exam room while observing appropriate contact time as indicated for disinfecting solutions.  Notice: This dictation was prepared with Dragon dictation along with smaller phrase technology. Any transcriptional errors that result from this process are unintentional and may not be corrected upon review.  Patient Instructions / Medication Changes & Studies & Tests Ordered   Patient Instructions  Medication Instructions:   suggestion for Dr Susa Simmonds or Kidney doctor - suggest  Pending lab values obtained by  them are okay.  recommend  Adding a diuretic or ARB to control blood pressure.  No changes at present time.   *If you need a refill on your cardiac medications before your next appointment, please call your pharmacy*   Lab Work: Not needed   Testing/Procedures: Not needed   Follow-Up: At [facility], you and your health needs are our priority.  As part of our continuing mission to provide you with exceptional heart care, we have created designated Provider Care Teams.  These Care Teams include your primary Cardiologist (physician) and Advanced Practice Providers (APPs -  Physician Assistants and Nurse Practitioners) who all work together to provide you with the care you need, when you need it.     Your next appointment:   12 month(s)  The format for your next appointment:   In Person  Provider:   Glenetta Hew, MD   Other Instructions   Your physician discussed the importance of regular exercise and following a heart healthy diet , recommended that you start or continue  a regular exercise program for good health. .    Studies Ordered:   Orders Placed This Encounter  Procedures  . EKG 12-Lead     Glenetta Hew, M.D., M.S. Interventional Cardiologist   Pager # [phone] Phone # [phone] [address] [place], [place] [address]   Thank you for choosing Heartcare at [facility]!!

## 2020-12-13 NOTE — Progress Notes (Addendum)
SUBJECTIVE:   CHIEF COMPLAINT / HPI:   Erin Coffey returns today for evaluation of falls and blood pressure.  Falls Erin Coffey notes that she fell on January 8 and again at the end of March. Both falls happened the same way, in which the pt was ascending the stairs to her porch and caught her left foot on the brick lip, and then fell on her left knee. She notes that she didn't think she got her left leg up high enough and therefore fell. She has tried to be more intentional with taking the last step slowly and deliberately. She uses a cane when her left knee is especially bothersome, which is a chronic problem of osteoarthritis, that pre-existed her falls on that same knee. She received three PEG injections with her orthopedist by the first of January. She notes that her left knee still hurts after falling on it, and continues to have some swelling today. She can flex and extend her knee well. She has been taking 650mg  Tylenol daily and rarely Tramadol. Denies light headedness, dizziness, vertigo, confusion, memory loss, or hitting her head after falling.  CKD IIIA Pt continues following with Dr. Janie Morning in Nephrology. Per pt, Dr. Janie Morning felt pt was not eating enough protein and requested that we check her Phosphate levels today.  HTN Pt saw cardiology today and her BP was higher than her baseline.  She continues on 100mg  Cozaar, 25mg  Metoprolol BID, and 5mg  Amlodipine. Allergic to ACE-I.  PERTINENT  PMH / PSH:  Osteoarthritis CKD IIIa HTN   OBJECTIVE:   BP 134/72 (BP Location: Left Arm)   Pulse 63   Ht 5\' 9"  (1.753 m)   Wt 297 lb (134.7 kg)   SpO2 99%   BMI 43.86 kg/m   Physical Exam Constitutional:      Appearance: Normal appearance.  Cardiovascular:     Rate and Rhythm: Normal rate and regular rhythm.     Pulses: Normal pulses.     Heart sounds: Normal heart sounds.  Pulmonary:     Effort: Pulmonary effort is normal.     Breath sounds: Normal breath sounds.   Musculoskeletal:     Comments: Left knee swelling and anterolateral tenderness, full ROM limited due to pain, pt has antalgic gait   Skin:    General: Skin is warm and dry.  Neurological:     Mental Status: She is alert and oriented to person, place, and time.  Psychiatric:        Mood and Affect: Mood normal.        Behavior: Behavior normal.      ASSESSMENT/PLAN:   Left anterior knee pain Pt's falls likely due to muscle weakness in left leg in setting of left knee osteoarthritis. Prior x-rays reviewed and show moderate to severe osteoarthritis. Recommended PT referral for the pt to aid muscle strengthening and balance, however the pt would like to discuss with her orthopedist. Continue topical therapy. Increase Tylenol to 650mg  TID or QID. Follow up with her orthopedist for further management and evaluation. Advised using cain more frequently, especially with acute left knee pain.   Chronic kidney disease, stage 3a ([place]) No phosphorus available on chart review. Patient does have lower albumin. Will check Renal function panel Recommended higher protein diet for the pt switching daily yogurt to [place] yogurt, and incorporating eggs at breakfast as well.  HYPERTENSION, BENIGN ESSENTIAL Stable and at goal. In our clinic today, BP was at 166/68 on first measurement, but  was at 134/72 on manual recheck after 15 minutes. Continue Losartan 100 mg, Metoprolol 25 mg BID, Amlodipine 5 mg. Follow up renal function panel.      Schuyler Alfonzo Feller, [place] ATTESTATION OF STUDENT NOTE   I attest that I have reviewed the student note and that the components of the history of the present illness, the physical exam, and the assessment and plan documented were performed by me or were performed in my presence by the student where I verified the documentation and performed (or re-performed) the exam and medical decision making. I verify that the  service and findings are accurately documented in the student's note.  [name], DO PGY-1, [facility] 12/13/2020

## 2020-12-13 NOTE — Assessment & Plan Note (Addendum)
No phosphorus available on chart review. Patient does have lower albumin. Will check Renal function panel Recommended higher protein diet for the pt switching daily yogurt to [place] yogurt, and incorporating eggs at breakfast as well.

## 2020-12-13 NOTE — Patient Instructions (Signed)
Medication Instructions:   suggestion for Dr Susa Simmonds or Kidney doctor - suggest  Pending lab values obtained by them are okay.  recommend  Adding a diuretic or ARB to control blood pressure.  No changes at present time.   *If you need a refill on your cardiac medications before your next appointment, please call your pharmacy*   Lab Work: Not needed   Testing/Procedures: Not needed   Follow-Up: At [facility], you and your health needs are our priority.  As part of our continuing mission to provide you with exceptional heart care, we have created designated Provider Care Teams.  These Care Teams include your primary Cardiologist (physician) and Advanced Practice Providers (APPs -  Physician Assistants and Nurse Practitioners) who all work together to provide you with the care you need, when you need it.     Your next appointment:   12 month(s)  The format for your next appointment:   In Person  Provider:   Glenetta Hew, MD   Other Instructions   Your physician discussed the importance of regular exercise and following a heart healthy diet , recommended that you start or continue a regular exercise program for good health. [name]

## 2020-12-13 NOTE — Assessment & Plan Note (Addendum)
Pt's falls likely due to muscle weakness in left leg in setting of left knee osteoarthritis. Prior x-rays reviewed and show moderate to severe osteoarthritis. Recommended PT referral for the pt to aid muscle strengthening and balance, however the pt would like to discuss with her orthopedist. Continue topical therapy. Increase Tylenol to 650mg  TID or QID. Follow up with her orthopedist for further management and evaluation. Advised using cain more frequently, especially with acute left knee pain.

## 2020-12-13 NOTE — Progress Notes (Deleted)
   SUBJECTIVE:   CHIEF COMPLAINT / HPI:   Chief Complaint  Patient presents with  . Blood work     Erin Coffey is a 76 y.o. female here for ***   Pt reports ***    PERTINENT  PMH / PSH: reviewed and updated as appropriate   OBJECTIVE:   BP (!) 166/68   Pulse 63   Ht 5\' 9"  (1.753 m)   Wt 297 lb (134.7 kg)   SpO2 99%   BMI 43.86 kg/m   ***  ASSESSMENT/PLAN:   No problem-specific Assessment & Plan notes found for this encounter.     [name], DO PGY-2, [facility] 12/13/2020      {    This will disappear when note is signed, click to select method of visit    :1}

## 2020-12-14 LAB — RENAL FUNCTION PANEL
Albumin: 3.8 g/dL (ref 3.7–4.7)
BUN/Creatinine Ratio: 13 (ref 12–28)
BUN: 20 mg/dL (ref 8–27)
CO2: 21 mmol/L (ref 20–29)
Calcium: 9.4 mg/dL (ref 8.7–10.3)
Chloride: 106 mmol/L (ref 96–106)
Creatinine, Ser: 1.54 mg/dL — ABNORMAL HIGH (ref 0.57–1.00)
Glucose: 117 mg/dL — ABNORMAL HIGH (ref 65–99)
Phosphorus: 3.2 mg/dL (ref 3.0–4.3)
Potassium: 4.7 mmol/L (ref 3.5–5.2)
Sodium: 141 mmol/L (ref 134–144)
eGFR: 36 mL/min/{1.73_m2} — ABNORMAL LOW (ref >59)

## 2020-12-14 NOTE — Assessment & Plan Note (Signed)
Stable and at goal. In our clinic today, BP was at 166/68 on first measurement, but was at 134/72 on manual recheck after 15 minutes. Continue Losartan 100 mg, Metoprolol 25 mg BID, Amlodipine 5 mg. Follow up renal function panel.

## 2020-12-26 ENCOUNTER — Other Ambulatory Visit: Payer: Self-pay

## 2020-12-26 ENCOUNTER — Emergency Department (HOSPITAL_COMMUNITY): Payer: Medicare Other

## 2020-12-26 ENCOUNTER — Encounter (HOSPITAL_COMMUNITY): Payer: Self-pay | Admitting: Emergency Medicine

## 2020-12-26 ENCOUNTER — Emergency Department (HOSPITAL_COMMUNITY)
Admission: EM | Admit: 2020-12-26 | Discharge: 2020-12-26 | Disposition: A | Discharge status: Home / Self Care | Payer: Medicare Other | Attending: Emergency Medicine | Admitting: Emergency Medicine

## 2020-12-26 DIAGNOSIS — I129 Hypertensive chronic kidney disease with stage 1 through stage 4 chronic kidney disease, or unspecified chronic kidney disease: Secondary | ICD-10-CM | POA: Diagnosis not present

## 2020-12-26 DIAGNOSIS — R0602 Shortness of breath: Secondary | ICD-10-CM | POA: Insufficient documentation

## 2020-12-26 DIAGNOSIS — D497 Neoplasm of unspecified behavior of endocrine glands and other parts of nervous system: Secondary | ICD-10-CM | POA: Diagnosis not present

## 2020-12-26 DIAGNOSIS — D352 Benign neoplasm of pituitary gland: Secondary | ICD-10-CM | POA: Insufficient documentation

## 2020-12-26 DIAGNOSIS — Z79899 Other long term (current) drug therapy: Secondary | ICD-10-CM | POA: Diagnosis not present

## 2020-12-26 DIAGNOSIS — R0789 Other chest pain: Secondary | ICD-10-CM | POA: Diagnosis not present

## 2020-12-26 DIAGNOSIS — E119 Type 2 diabetes mellitus without complications: Secondary | ICD-10-CM | POA: Insufficient documentation

## 2020-12-26 DIAGNOSIS — I2510 Atherosclerotic heart disease of native coronary artery without angina pectoris: Secondary | ICD-10-CM | POA: Insufficient documentation

## 2020-12-26 DIAGNOSIS — Z7901 Long term (current) use of anticoagulants: Secondary | ICD-10-CM | POA: Diagnosis not present

## 2020-12-26 DIAGNOSIS — H748X2 Other specified disorders of left middle ear and mastoid: Secondary | ICD-10-CM | POA: Diagnosis not present

## 2020-12-26 DIAGNOSIS — N1831 Chronic kidney disease, stage 3a: Secondary | ICD-10-CM | POA: Diagnosis not present

## 2020-12-26 DIAGNOSIS — R42 Dizziness and giddiness: Secondary | ICD-10-CM | POA: Diagnosis not present

## 2020-12-26 DIAGNOSIS — R079 Chest pain, unspecified: Secondary | ICD-10-CM | POA: Insufficient documentation

## 2020-12-26 DIAGNOSIS — R0600 Dyspnea, unspecified: Secondary | ICD-10-CM | POA: Diagnosis not present

## 2020-12-26 DIAGNOSIS — H8110 Benign paroxysmal vertigo, unspecified ear: Secondary | ICD-10-CM | POA: Diagnosis not present

## 2020-12-26 LAB — COMPREHENSIVE METABOLIC PANEL
ALT: 14 U/L (ref 0–44)
AST: 21 U/L (ref 15–41)
Albumin: 3.4 g/dL — ABNORMAL LOW (ref 3.5–5.0)
Alkaline Phosphatase: 51 U/L (ref 38–126)
Anion gap: 8 (ref 5–15)
BUN: 19 mg/dL (ref 8–23)
CO2: 22 mmol/L (ref 22–32)
Calcium: 9.3 mg/dL (ref 8.9–10.3)
Chloride: 107 mmol/L (ref 98–111)
Creatinine, Ser: 1.5 mg/dL — ABNORMAL HIGH (ref 0.44–1.00)
GFR, Estimated: 37 mL/min — ABNORMAL LOW (ref >60)
Glucose, Bld: 117 mg/dL — ABNORMAL HIGH (ref 70–99)
Potassium: 4.1 mmol/L (ref 3.5–5.1)
Sodium: 137 mmol/L (ref 135–145)
Total Bilirubin: 0.7 mg/dL (ref 0.3–1.2)
Total Protein: 8.1 g/dL (ref 6.5–8.1)

## 2020-12-26 LAB — CBC
HCT: 35.4 % — ABNORMAL LOW (ref 36.0–46.0)
Hemoglobin: 11.2 g/dL — ABNORMAL LOW (ref 12.0–15.0)
MCH: 29.3 pg (ref 26.0–34.0)
MCHC: 31.6 g/dL (ref 30.0–36.0)
MCV: 92.7 fL (ref 80.0–100.0)
Platelets: 204 10*3/uL (ref 150–400)
RBC: 3.82 MIL/uL — ABNORMAL LOW (ref 3.87–5.11)
RDW: 16 % — ABNORMAL HIGH (ref 11.5–15.5)
WBC: 6 10*3/uL (ref 4.0–10.5)
nRBC: 0 % (ref 0.0–0.2)

## 2020-12-26 LAB — TROPONIN I (HIGH SENSITIVITY)
Troponin I (High Sensitivity): 7 ng/L (ref <18)
Troponin I (High Sensitivity): 8 ng/L (ref <18)

## 2020-12-26 MED ORDER — AMLODIPINE BESYLATE 5 MG PO TABS
5.0000 mg | ORAL_TABLET | Freq: Once | ORAL | Status: AC
  Administered 2020-12-26: 5 mg via ORAL
  Filled 2020-12-26: qty 1

## 2020-12-26 MED ORDER — MECLIZINE HCL 12.5 MG PO TABS: 12.5000 mg | ORAL_TABLET | Freq: Three times a day (TID) | ORAL | 0 refills | Status: DC | PRN

## 2020-12-26 MED ORDER — MECLIZINE HCL 25 MG PO TABS
12.5000 mg | ORAL_TABLET | Freq: Once | ORAL | Status: AC
  Administered 2020-12-26: 12.5 mg via ORAL
  Filled 2020-12-26: qty 1

## 2020-12-26 NOTE — ED Triage Notes (Signed)
Patient reports central chest pain with tightness and SOB onset this evening with dizziness . No emesis or diaphoresis . Occasional dry cough .

## 2020-12-26 NOTE — ED Notes (Signed)
PT was complaining of difficulty breathing, so I took her vitals again. When I spoke with the PT, she had diminished responsiveness to verbal stimuli, but was responsive to touch stimuli.

## 2020-12-26 NOTE — ED Provider Notes (Signed)
[facility] EMERGENCY DEPARTMENT Provider Note   CSN: [id number] Arrival date & time: 12/26/20  0133     History Chief Complaint  Patient presents with  . Chest Pain  . Shortness of Breath    Erin Coffey is a 75 y.o. female.  75 year old female here with a couple issues.  Sounds the patient woke up tonight and felt like the room was spinning.  She had a little mild dull chest pain.  She states that she had a history of pulmonary embolus and it felt somewhat like this.  She has other shortness of breath so brought here for further evaluation..  States this time she feels better but not completely normal.  No recent illnesses.  No cough or fevers.  No falls or trauma.  No lower extremity edema.  No neurologic changes.   Chest Pain Associated symptoms: shortness of breath   Shortness of Breath Associated symptoms: chest pain        Past Medical History:  Diagnosis Date  . Allergy   . Anemia, iron deficiency   . ANXIETY, SITUATIONAL 04/06/2009   Qualifier: Diagnosis of  By: [name]  MD, Colletta [place]    . Arthritis   . Back pain, thoracic 12/20/2012  . Carpal tunnel syndrome 12/07/2014  . Cholelithiasis 06/25/2012  . Chronic kidney disease   . DM type 2 goal A1C below 7.5 06/05/2008   Qualifier: Diagnosis of  By: [name]  MD, Colletta [place]    . GASTROESOPHAGEAL REFLUX, NO ESOPHAGITIS 10/08/2006   Qualifier: Diagnosis of  By: [name]    . Goiter, unspecified 10/21/2007   Qualifier: Diagnosis of  By: [name] MD, [name]    . History of DVT (deep vein thrombosis) 05/05/2013   on Eliquis  . HYPERLIPIDEMIA 08/08/2008   Qualifier: Diagnosis of  By: [name]  MD, Colletta [place]    . HYPERTENSION, BENIGN ESSENTIAL 11/27/2006   Qualifier: Diagnosis of  By: [name] MD, [place]    . Mild coronary artery disease 10/2019   Coronary CT Angiogram: Coronary calcium score 46.Normal coronary origin.  Left dominance with tortuous coronary arteries.  Nondominant RCA - prox < 25% followed by  ectasia (dilated to 5.5 mm) mixed plaque in distal vessel.  LM- normal . LAD: minimal mixed plaque in ostial & proximal (<25%) with brief IM bridging in mLAD. Mild mid-distal (25-49%), RI - prox <25%, Large LCx- minimal (<25%) mid-dista  . Mild coronary artery disease   . Personal history of colonic polyps 10/24/2009   Qualifier: Diagnosis of  By: [name] MD, Sandy Salaam   . Pica 08/08/2009   Qualifier: Diagnosis of  By: [name], [name]    . Polymyalgia rheumatica ([place]) 10/02/2009   Qualifier: Diagnosis of  By: [name] MD, Apolonio Schneiders    . Pulmonary embolism ([place]) - On long Term anticoagulation (Z79.01) 04/28/2011   Converted from warfarin to Eliquis; on long-term anticoagulation.  . Thyroid disease   . Trigger ring finger of left hand 11/02/2014    Patient Active Problem List   Diagnosis Date Noted  . Left anterior knee pain 12/13/2020  . Chronic kidney disease, stage 3a ([place]) 12/13/2020  . Sciatic nerve pain, left 05/30/2020  . Lower abdominal pain 04/30/2020  . Boil of buttock 04/19/2020  . Mild coronary artery disease 10/2019  . History of spinal fracture 03/24/2019  . Age-related osteoporosis with current pathological fracture 03/24/2019  . Nontraumatic complete tear of left rotator cuff 03/24/2019  . Anginal chest pain at rest [facility]) 03/24/2019  . Primary  hypercoagulable state ([place]) 03/07/2019  . Osteoporotic compression fracture of spine ([place]) 03/07/2019  . Pulmonary emboli ([place]) 03/03/2019  . Chronic left hip pain 08/17/2018  . Left sided numbness 07/22/2018  . Onychogryphosis 05/31/2018  . Tinea pedis of right foot 05/31/2018  . Wheezing 05/31/2018  . Low back pain potentially associated with radiculopathy 01/29/2018  . Diverticulosis of colon 03/24/2017  . Hypertensive retinopathy of right eye 01/20/2017  . Hypermetropia of both eyes 01/20/2017  . Back pain 01/05/2017  . Chronic kidney disease 04/29/2016  . Normocytic anemia 04/29/2016  . Dark stools 10/19/2015  . Rotator cuff  dysfunction 06/25/2015  . Bruise 12/07/2014  . Long term (current) use of anticoagulants 05/08/2011  . Hyperlipidemia associated with type 2 diabetes mellitus ([place]) 08/08/2008  . Type 2 diabetes mellitus with hemoglobin A1c goal of less than 7.5% ([place]) 06/05/2008  . HYPERTENSION, BENIGN ESSENTIAL 11/27/2006  . GASTROESOPHAGEAL REFLUX, NO ESOPHAGITIS 10/08/2006  . Osteoarthritis, multiple sites 10/08/2006    Past Surgical History:  Procedure Laterality Date  . ABDOMINAL HYSTERECTOMY    . CARDIAC CATHETERIZATION  04/29/11   (Dr. Lia Foyer [facility]): LM - normal. LAD with 1 major Diag - normal, bifurcating Ramus - normal, Large (6-7 mm) Dominant LCx with 1 OM, 2 LPL branches & LPDA - non significant disease; small non-dominant RCA.    [name] CARDIAC EVENT MONITOR  09/2019   Mostly sinus rhythm, average rate 60 bpm.  Range 45 to 190 bpm.  No critical events indicating any arrhythmias or pauses.  For patient responses noted PVCs.  Less than 1% PVCs noted.   [name] CARPAL TUNNEL RELEASE Right 1996  . COLONOSCOPY    . KNEE ARTHROSCOPY  Bilateral  . TRANSTHORACIC ECHOCARDIOGRAM  09/2019   Normal EF - 60 to 65%.  Mild LVH.  GR 1 DD.  Normal RV.  Trivial MR.  Normal IVC.  Normal PA pressures.  . TUBAL LIGATION    . UPPER GASTROINTESTINAL ENDOSCOPY       OB History   No obstetric history on file.     Family History  Problem Relation Age of Onset  . Diabetes Mother   . Heart disease Mother   . Hypertension Mother   . Heart disease Father   . Hypertension Father   . Diabetes Sister   . Heart disease Sister   . Hypertension Brother   . Cancer Sister 21       lukemia  . Cirrhosis Sister        alcohol  . Kidney disease Sister   . Diabetes Sister   . Diabetes Brother   . Colon cancer Neg Hx   . Pancreatic cancer Neg Hx   . Esophageal cancer Neg Hx     Social History   Tobacco Use  . Smoking status: Never Smoker  . Smokeless tobacco: Never Used  Vaping Use  . Vaping Use: Never used   Substance Use Topics  . Alcohol use: No    Alcohol/week: 0.0 standard drinks  . Drug use: No    Home Medications Prior to Admission medications   Medication Sig Start Date End Date Taking? Authorizing Provider  meclizine (ANTIVERT) 12.5 MG tablet Take 1 tablet (12.5 mg total) by mouth 3 (three) times daily as needed for dizziness. 12/26/20  Yes Adley Castello, Corene Cornea, MD  acetaminophen (TYLENOL) 650 MG CR tablet Take 1,300 mg by mouth every 8 (eight) hours as needed for pain.    [provider]  alendronate (FOSAMAX) 70 MG  tablet TAKE 1 TABLET BY MOUTH  EVERY 7 DAYS WITH A FULL  GLASS OF WATER ON AN EMPTY  STOMACH Patient taking differently: Take 70 mg by mouth once a week. 04/23/20   Brimage, Ronnette Juniper, DO  amLODipine (NORVASC) 5 MG tablet Take 1 tablet (5 mg total) by mouth at bedtime. 03/27/20   Brimage, Ronnette Juniper, DO  apixaban (ELIQUIS) 5 MG TABS tablet Take 1 tablet (5 mg total) by mouth 2 (two) times daily. Begin after completing starterpack for eliquis Patient taking differently: Take 5 mg by mouth 2 (two) times daily. 03/22/20   Brimage, Ronnette Juniper, DO  atorvastatin (LIPITOR) 40 MG tablet Take 1 tablet (40 mg total) by mouth daily. 03/27/20   Brimage, Ronnette Juniper, DO  baclofen (LIORESAL) 10 MG tablet Take 1 tablet (10 mg total) by mouth daily as needed for muscle spasms. 03/27/20   [name], DO  Blood Glucose Monitoring Suppl (ONETOUCH VERIO) w/Device KIT Use as instructed to test sugars once daily.  Dx Code: E11.9 02/10/19   Martyn Malay, MD  CHOLECALCIFEROL PO Take 2,200 Units by mouth daily.    [provider]  ELDERBERRY PO Take 2 g by mouth daily at 12 noon.    [provider]  fish oil-omega-3 fatty acids 1000 MG capsule Take 1 g by mouth daily.    [provider]  gabapentin (NEURONTIN) 100 MG capsule TAKE 1 CAPSULE BY MOUTH  TWICE DAILY Patient taking differently: Take 100 mg by mouth 2 (two) times daily. 05/01/20   Brimage, Ronnette Juniper, DO  glucose blood (ONETOUCH  VERIO) test strip Use as instructed to test sugars once daily.  Dx Code: E11.9 10/29/20   [name], DO  ketoconazole (NIZORAL) 2 % cream Apply to both feet once daily for 4 weeks. 10/16/20   Marzetta Board, DPM  Lancet Devices (ONE TOUCH DELICA LANCING DEV) MISC Use as instructed to test sugars once daily.  Dx Code: E11.9 10/29/20   [name], DO  losartan (COZAAR) 100 MG tablet Take 100 mg by mouth daily. 10/05/20   [provider]  metoprolol tartrate (LOPRESSOR) 25 MG tablet TAKE 1 TABLET BY MOUTH  TWICE DAILY Patient taking differently: Take 25 mg by mouth 2 (two) times daily. 05/01/20   [name], DO  mupirocin cream (BACTROBAN) 2 % Apply 1 application topically 2 (two) times daily. 04/19/20   [name], DO  omeprazole (PRILOSEC) 20 MG capsule Take 20 mg by mouth daily.    [provider]  OneTouch Delica Lancets 80D MISC Use as instructed to test sugars once daily.  Dx Code: E11.9 02/10/19   Martyn Malay, MD  polyethylene glycol powder (GLYCOLAX/MIRALAX) powder Take 17 g by mouth 2 (two) times daily as needed. Patient taking differently: Take 17 g by mouth daily as needed (constipation). 08/17/18   Myles Gip, DO  traMADol (ULTRAM) 50 MG tablet Take 1 tablet (50 mg total) by mouth every 6 (six) hours as needed. Patient taking differently: Take 50 mg by mouth every 6 (six) hours as needed for moderate pain. 08/28/20   Brimage, Ronnette Juniper, DO  triamcinolone (KENALOG) 0.1 % Apply 1 application topically daily as needed. Apply to rash 10/29/20   [name], DO  vitamin C (ASCORBIC ACID) 500 MG tablet Take 500 mg by mouth daily.    [provider]    Allergies    Lisinopril  Review of Systems   Review of Systems  Respiratory: Positive for shortness of breath.   Cardiovascular:  Positive for chest pain.  All other systems reviewed and are negative.   Physical Exam Updated Vital Signs BP (!) 165/62   Pulse 61   Temp 98.6 F (37 C)  (Oral)   Resp 15   SpO2 100%   Physical Exam Vitals and nursing note reviewed.  Constitutional:      Appearance: She is well-developed.  HENT:     Head: Normocephalic and atraumatic.     Right Ear: No middle ear effusion.     Left Ear: A middle ear effusion is present.  Cardiovascular:     Rate and Rhythm: Normal rate and regular rhythm.  Pulmonary:     Effort: No respiratory distress.     Breath sounds: No stridor.  Chest:     Chest wall: No mass or deformity.  Abdominal:     General: There is no distension.     Palpations: Abdomen is soft.  Musculoskeletal:        General: Normal range of motion.     Cervical back: Normal range of motion.  Skin:    General: Skin is warm and dry.  Neurological:     General: No focal deficit present.     Mental Status: She is alert.     ED Results / Procedures / Treatments   Labs (all labs ordered are listed, but only abnormal results are displayed) Labs Reviewed  COMPREHENSIVE METABOLIC PANEL - Abnormal; Notable for the following components:      Result Value   Glucose, Bld 117 (*)    Creatinine, Ser 1.50 (*)    Albumin 3.4 (*)    GFR, Estimated 37 (*)    All other components within normal limits  CBC - Abnormal; Notable for the following components:   RBC 3.82 (*)    Hemoglobin 11.2 (*)    HCT 35.4 (*)    RDW 16.0 (*)    All other components within normal limits  TROPONIN I (HIGH SENSITIVITY)  TROPONIN I (HIGH SENSITIVITY)    EKG None  Radiology DG Chest 2 View  Result Date: 12/26/2020 CLINICAL DATA:  Chest pain, dyspnea EXAM: CHEST - 2 VIEW COMPARISON:  09/01/2019 FINDINGS: Lungs are well expanded, symmetric, and clear. No pneumothorax or pleural effusion. Cardiac size within normal limits. Pulmonary vascularity is normal. Osseous structures are age-appropriate. No acute bone abnormality. IMPRESSION: No active cardiopulmonary disease. Electronically Signed   By: Fidela Salisbury MD   On: 12/26/2020 02:55   CT Head Wo  Contrast  Result Date: 12/26/2020 CLINICAL DATA:  Dizziness EXAM: CT HEAD WITHOUT CONTRAST TECHNIQUE: Contiguous axial images were obtained from the base of the skull through the vertex without intravenous contrast. COMPARISON:  None. FINDINGS: Brain: Since the prior examination, there has developed a mass within the pituitary which extends superiorly into the suprasellar cistern measuring up to 15 mm in greatest dimension (sagittal image # 31). This may represent a pituitary adenoma, but is not optimally characterized on this exam. No additional intra or extra-axial mass lesion or fluid collection. No abnormal mass effect or midline shift. No evidence of acute intracranial hemorrhage or infarct. Ventricular size is normal. Cerebellum unremarkable. Vascular: Unremarkable Skull: Intact Sinuses/Orbits: Paranasal sinuses are clear. Orbits are unremarkable. Other: Mastoid air cells and middle ear cavities are clear. IMPRESSION: No acute intracranial abnormality. Interval development of a pituitary mass extending into the suprasellar cistern possibly representing a pituitary macro adenoma. This could be confirmed with a routine follow-up pituitary protocol MRI examination with and without contrast  to confirm this finding. Electronically Signed   By: Fidela Salisbury MD   On: 12/26/2020 06:18    Procedures Procedures   Medications Ordered in ED Medications  amLODipine (NORVASC) tablet 5 mg (5 mg Oral Given 12/26/20 0516)  meclizine (ANTIVERT) tablet 12.5 mg (12.5 mg Oral Given 12/26/20 0516)    ED Course  I have reviewed the triage vital signs and the nursing notes.  Pertinent labs & imaging results that were available during my care of the patient were reviewed by me and considered in my medical decision making (see chart for details).    MDM Rules/Calculators/A&P                          Low suspicion for pulmonary embolus with normal heart rate, blood pressure, oxygen saturation.  She is also  compliant with her anticoagulants.  I suspect this is probably vertigo.  She is been ruled out for ACS here as well with high-sensitivity troponins and unchanged EKG.  Of note patient was found to have a likely pituitary macroadenoma.  I do not think that this is related to her vertigo.  I think her vertigo is related to middle ear effusion.  She will follow-up with her primary doctor get MRI to verify.   Final Clinical Impression(s) / ED Diagnoses Final diagnoses:  Chest pain, unspecified type  Vertigo  Pituitary macroadenoma with extrasellar extension [facility])    Rx / DC Orders ED Discharge Orders         Ordered    meclizine (ANTIVERT) 12.5 MG tablet  3 times daily PRN        12/26/20 0624           Rydell Wiegel, Corene Cornea, MD 12/26/20 0630

## 2020-12-26 NOTE — ED Notes (Signed)
Pt. Had high blood pressure, RN Bobby notified

## 2020-12-26 NOTE — ED Notes (Signed)
Patient transported to CT 

## 2020-12-26 NOTE — ED Provider Notes (Addendum)
  Emergency Medicine Provider in Triage Note   MSE was initiated and I personally evaluated the patient  1:50 AM on Dec 26, 2020 as provider in triage.   Chief Complaint: chest discomfort  HPI  Patient is a 75 y.o. who presents to the ED with complaints of chest discomfort that started 30 minutes PTA. Chest tightness centrally, radiates to the LUE some, with associated dyspnea & lightheadedness like she might pass out. No dizziness like the room spinning, numbness, focal weakness, or visual disturbance. Denies nausea, vomiting, or diaphoresis. Compliant with eliquis.    Review of Systems  Positive: Chest pain, dyspnea, lightheadedness.  Negative: N/V, diaphoresis, numbness, weakness, visual disturbance.   Physical Exam  BP (!) 166/77 (BP Location: Right Arm)   Pulse 74   Temp 98.6 F (37 C) (Oral)   SpO2 100%    Gen:   Awake,  HEENT:  Atraumatic  Resp:  Normal effort  Cardiac:  Normal rate  Abd:   Nondistended, nontender  MSK:   Moves extremities without difficulty  Neuro:  Speech clear. PERRL. EOMI. Symmetric grip strength. Sensation grossly intact x 4. Negative pronator drift  Medical Decision Making   Initiation of care has begun. The patient has been counseled on the process, plan, and necessity for staying for the completion/evaluation, informed that the remainder of the evaluation will be completed by another provider, this initial triage assessment does not replace that evaluation, and the importance of remaining in the ED until their evaluation is complete   Clinical Impression  Chest pain        Furman Trentman, Glynda Jaeger, PA-C 12/26/20 0153  Patient with complaints of dyspnea, repeat SPO2 100% on RA, no signs of respiratory distress, will continue to monitor pending acute care bed availability.    Amaryllis Dyke, PA-C 12/26/20 [place], [place], DO 12/26/20 [id number]

## 2020-12-28 ENCOUNTER — Encounter: Payer: Self-pay | Admitting: Family Medicine

## 2020-12-28 ENCOUNTER — Ambulatory Visit (INDEPENDENT_AMBULATORY_CARE_PROVIDER_SITE_OTHER): Payer: Medicare Other | Admitting: Family Medicine

## 2020-12-28 ENCOUNTER — Other Ambulatory Visit: Payer: Self-pay

## 2020-12-28 VITALS — BP 130/56 | HR 71 | Ht 69.0 in | Wt 294.0 lb

## 2020-12-28 DIAGNOSIS — E236 Other disorders of pituitary gland: Secondary | ICD-10-CM | POA: Diagnosis not present

## 2020-12-28 NOTE — Patient Instructions (Signed)
It was great seeing you today!  Please check-out at the front desk before leaving the clinic. I'd like to see you back after MRI.   Visit Remembers: - Look out for a phone call to schedule your MRI  Regarding lab work today:  Due to recent changes in healthcare laws, you may see the results of your imaging and laboratory studies on MyChart before your provider has had a chance to review them.  I understand that in some cases there may be results that are confusing or concerning to you. Not all laboratory results come back in the same time frame and you may be waiting for multiple results in order to interpret others.  Please give [name] 72 hours in order for your provider to thoroughly review all the results before contacting the office for clarification of your results. If everything is normal, you will get a letter in the mail or a message in My Chart. Please give [name] a call if you do not hear from [name] after 2 weeks.  Please bring all of your medications with you to each visit.    If you haven't already, sign up for My Chart to have easy access to your labs results, and communication with your primary care physician.  Feel free to call with any questions or concerns at any time, at [phone].   Take care,  Dr. Rushie [facility]

## 2020-12-28 NOTE — Progress Notes (Signed)
   SUBJECTIVE:   CHIEF COMPLAINT / HPI:   Chief Complaint  Patient presents with  . Follow-up     Erin Coffey is a 75 y.o. female here for ED follow up. Patient states she has room spinning dizziness and chest discomfort therefore she went to the ED. She was given meclizine for vertigo. She had a CT head that showed an abnormality and was told to follow up with PCP for an MRI. Denies current dizziness, headache, vision changes, chest discomfort or shortness of breath. Takes Eliquis. Denies recent falls or head trauma.      PERTINENT  PMH / PSH: reviewed and updated as appropriate   OBJECTIVE:   BP (!) 130/56   Pulse 71   Ht 5\' 9"  (1.753 m)   Wt 294 lb (133.4 kg)   SpO2 99%   BMI 43.42 kg/m    GEN: pleasant older female, in no acute distress, wearing glasses  CV: regular rate and rhythm, no murmurs appreciated  RESP: no increased work of breathing, clear to ascultation bilaterally SKIN: warm, dry NEURO: at baseline, alert, oriented, normal speech, visual fields intact, moves all extremities appropriately    ASSESSMENT/PLAN:   Pituitary mass [facility]) Reviewed ED notes and CT Head. CT Head showing macro-adenoma. Incidental finding. Will obtain prolactin and TSH. Serum creatine elevated. Pt with CKD stage 3B. Discussed MRI contrast with on-call radiologist who recommended MR Brain w and w/o contrast. Ordered. Neuro exam at baseline. No visual changes. Follow up after MRI.      [name], DO PGY-2, [facility] 12/28/2020

## 2020-12-29 LAB — PROLACTIN: Prolactin: 19.1 ng/mL (ref 4.8–23.3)

## 2020-12-29 LAB — TSH: TSH: 1.9 u[IU]/mL (ref 0.450–4.500)

## 2020-12-31 DIAGNOSIS — E236 Other disorders of pituitary gland: Secondary | ICD-10-CM | POA: Insufficient documentation

## 2020-12-31 NOTE — Assessment & Plan Note (Addendum)
Reviewed ED notes and CT Head. CT Head showing macro-adenoma. Incidental finding. Will obtain prolactin and TSH. Serum creatine elevated. Pt with CKD stage 3B. Discussed MRI contrast with on-call radiologist who recommended MR Brain w and w/o contrast. Ordered. Neuro exam at baseline. No visual changes. Follow up after MRI.

## 2021-01-03 DIAGNOSIS — M1712 Unilateral primary osteoarthritis, left knee: Secondary | ICD-10-CM | POA: Diagnosis not present

## 2021-01-07 ENCOUNTER — Encounter: Payer: Self-pay | Admitting: Cardiology

## 2021-01-07 NOTE — Assessment & Plan Note (Addendum)
With hypertension and hyperlipidemia, and diabetes, obesity meets the criteria metabolic syndrome.  Increased risk for cardiovascular disease.  She has done really well with weight loss, and then regained all the weight she had lost. Discussed dietary modification.  She needs for Erin Sidle do exercise that will hurt her joints.  Discussed potentially using exercise bicycle and water aerobics

## 2021-01-07 NOTE — Assessment & Plan Note (Signed)
Has not had labs since August.  Last LDL was 69.  This is on atorvastatin 40 mg daily.  Monitor closely.   Thankfully, coronary Score is relatively low risk, but her other risk factors are significant.  Should target LDL less than 100 if not closer to less than 70.  If remains stable, would just simply continue current dose of statin.  Hopefully with weight loss her levels will improve as well.

## 2021-01-07 NOTE — Assessment & Plan Note (Signed)
Mild CAD noted on coronary CTA with coronary calcium score 46.  Risk factor modification warranted:  Lipid, blood pressure and glycemic management.  Weight loss.

## 2021-01-07 NOTE — Assessment & Plan Note (Signed)
Converted from a warfarin to apixaban.  Now on stable dose of 5 mg twice daily apixaban.  No bleeding issues.

## 2021-01-07 NOTE — Assessment & Plan Note (Signed)
BP not quite at goal. Resting heart rate 63 bpm, therefore would not titrate beta-blocker, we do have room to increase amlodipine if pressures continue to be elevated.  Continue losartan 100 mg daily.  Monitor pressures with PCP or nephrology.  If pressures are still elevated, would increase amlodipine to 10 mg daily.

## 2021-01-11 ENCOUNTER — Other Ambulatory Visit: Payer: Self-pay | Admitting: Family Medicine

## 2021-01-12 ENCOUNTER — Other Ambulatory Visit: Payer: Self-pay

## 2021-01-12 ENCOUNTER — Ambulatory Visit
Admission: RE | Admit: 2021-01-12 | Discharge: 2021-01-12 | Disposition: A | Discharge status: Home / Self Care | Payer: Medicare Other | Source: Ambulatory Visit | Attending: Family Medicine | Admitting: Family Medicine

## 2021-01-12 DIAGNOSIS — E236 Other disorders of pituitary gland: Secondary | ICD-10-CM

## 2021-01-12 DIAGNOSIS — J3489 Other specified disorders of nose and nasal sinuses: Secondary | ICD-10-CM | POA: Diagnosis not present

## 2021-01-12 DIAGNOSIS — E237 Disorder of pituitary gland, unspecified: Secondary | ICD-10-CM | POA: Diagnosis not present

## 2021-01-12 DIAGNOSIS — R220 Localized swelling, mass and lump, head: Secondary | ICD-10-CM | POA: Diagnosis not present

## 2021-01-12 DIAGNOSIS — D352 Benign neoplasm of pituitary gland: Secondary | ICD-10-CM | POA: Diagnosis not present

## 2021-01-12 MED ORDER — GADOBENATE DIMEGLUMINE 529 MG/ML IV SOLN
10.0000 mL | Freq: Once | INTRAVENOUS | Status: AC | PRN
  Administered 2021-01-12: 10 mL via INTRAVENOUS

## 2021-01-14 ENCOUNTER — Telehealth: Payer: Self-pay | Admitting: Family Medicine

## 2021-01-14 NOTE — Telephone Encounter (Signed)
Routed to PCP. Sharone Almond, CMA  

## 2021-01-14 NOTE — Telephone Encounter (Signed)
Patient called to schedule her follow up appointment from having an mri. The first available was a few weeks out. She scheduled appointment but would like for Dr. Susa Simmonds to call her with the results before appointment date.   The best call back is [phone].

## 2021-01-15 DIAGNOSIS — H81399 Other peripheral vertigo, unspecified ear: Secondary | ICD-10-CM | POA: Diagnosis not present

## 2021-01-15 DIAGNOSIS — I10 Essential (primary) hypertension: Secondary | ICD-10-CM | POA: Diagnosis not present

## 2021-01-15 DIAGNOSIS — D497 Neoplasm of unspecified behavior of endocrine glands and other parts of nervous system: Secondary | ICD-10-CM | POA: Diagnosis not present

## 2021-01-16 DIAGNOSIS — D497 Neoplasm of unspecified behavior of endocrine glands and other parts of nervous system: Secondary | ICD-10-CM | POA: Diagnosis not present

## 2021-01-17 DIAGNOSIS — Z780 Asymptomatic menopausal state: Secondary | ICD-10-CM | POA: Diagnosis not present

## 2021-01-17 DIAGNOSIS — Z1231 Encounter for screening mammogram for malignant neoplasm of breast: Secondary | ICD-10-CM | POA: Diagnosis not present

## 2021-01-22 ENCOUNTER — Encounter: Payer: Self-pay | Admitting: Family Medicine

## 2021-01-23 ENCOUNTER — Encounter: Payer: Self-pay | Admitting: Family Medicine

## 2021-01-25 ENCOUNTER — Other Ambulatory Visit: Payer: Self-pay

## 2021-01-25 ENCOUNTER — Ambulatory Visit: Payer: Medicare Other | Admitting: Podiatry

## 2021-01-25 ENCOUNTER — Encounter: Payer: Self-pay | Admitting: Podiatry

## 2021-01-25 DIAGNOSIS — M79674 Pain in right toe(s): Secondary | ICD-10-CM

## 2021-01-25 DIAGNOSIS — B351 Tinea unguium: Secondary | ICD-10-CM | POA: Diagnosis not present

## 2021-01-25 DIAGNOSIS — M79675 Pain in left toe(s): Secondary | ICD-10-CM

## 2021-01-31 NOTE — Progress Notes (Signed)
  Subjective:  Patient ID: Erin Coffey, female    DOB: 1945-12-25,  MRN: [id number]  75 y.o. female presents preventative diabetic foot care and painful thick toenails that are difficult to trim. Pain interferes with ambulation. Aggravating factors include wearing enclosed shoe gear. Pain is relieved with periodic professional debridement.  Patient voices no new pedal problems on today's visit.  Allergies  Allergen Reactions   Lisinopril Cough    Review of Systems: Negative except as noted in the HPI.   Objective:   Constitutional Pt is a pleasant 75 y.o. African American female morbidly obese in NAD. AAO x 3.   Vascular Capillary fill time to digits <3 seconds b/l lower extremities. Palpable DP pulse(s) b/l lower extremities Palpable PT pulse(s) b/l lower extremities Pedal hair absent. Lower extremity skin temperature gradient within normal limits. No pain with calf compression b/l.  Neurologic Pt has subjective symptoms of neuropathy. Protective sensation intact 5/5 intact bilaterally with 10g monofilament b/l.  Dermatologic Toenails 1-5 b/l elongated, discolored, dystrophic, thickened, crumbly with subungual debris and tenderness to dorsal palpation.  Orthopedic: Normal muscle strength 5/5 to all lower extremity muscle groups bilaterally. No pain crepitus or joint limitation noted with ROM b/l. No gross bony deformities bilaterally.   Radiographs: None Assessment:   1. Pain due to onychomycosis of toenails of both feet    Plan:  Patient was evaluated and treated and all questions answered.  Onychomycosis with pain -Nails palliatively debridement as below. -Educated on self-care  Procedure: Nail Debridement Rationale: Pain Type of Debridement: manual, sharp debridement. Instrumentation: Nail nipper, rotary burr. Number of Nails: 10  -Examined patient. -Continue diabetic foot care principles. -Patient to continue soft, supportive shoe gear daily. -Toenails 1-5 b/l  were debrided in length and girth with sterile nail nippers and dremel without iatrogenic bleeding.  -Patient to report any pedal injuries to medical professional immediately. -Patient/POA to call should there be question/concern in the interim.  Return in about 3 months (around 04/27/2021).  Marzetta Board, DPM

## 2021-02-04 DIAGNOSIS — H5203 Hypermetropia, bilateral: Secondary | ICD-10-CM | POA: Diagnosis not present

## 2021-02-04 DIAGNOSIS — H35033 Hypertensive retinopathy, bilateral: Secondary | ICD-10-CM | POA: Diagnosis not present

## 2021-02-04 DIAGNOSIS — H524 Presbyopia: Secondary | ICD-10-CM | POA: Diagnosis not present

## 2021-02-04 DIAGNOSIS — I10 Essential (primary) hypertension: Secondary | ICD-10-CM | POA: Diagnosis not present

## 2021-02-05 ENCOUNTER — Other Ambulatory Visit: Payer: Self-pay

## 2021-02-05 ENCOUNTER — Encounter: Payer: Self-pay | Admitting: Family Medicine

## 2021-02-05 ENCOUNTER — Ambulatory Visit (INDEPENDENT_AMBULATORY_CARE_PROVIDER_SITE_OTHER): Payer: Medicare Other | Admitting: Family Medicine

## 2021-02-05 VITALS — BP 148/64 | HR 58 | Ht 69.0 in | Wt 294.2 lb

## 2021-02-05 DIAGNOSIS — E236 Other disorders of pituitary gland: Secondary | ICD-10-CM

## 2021-02-05 DIAGNOSIS — E119 Type 2 diabetes mellitus without complications: Secondary | ICD-10-CM | POA: Diagnosis not present

## 2021-02-05 DIAGNOSIS — H81399 Other peripheral vertigo, unspecified ear: Secondary | ICD-10-CM | POA: Diagnosis not present

## 2021-02-05 DIAGNOSIS — G8929 Other chronic pain: Secondary | ICD-10-CM

## 2021-02-05 DIAGNOSIS — I10 Essential (primary) hypertension: Secondary | ICD-10-CM | POA: Diagnosis not present

## 2021-02-05 DIAGNOSIS — M1712 Unilateral primary osteoarthritis, left knee: Secondary | ICD-10-CM | POA: Diagnosis not present

## 2021-02-05 DIAGNOSIS — M545 Low back pain, unspecified: Secondary | ICD-10-CM | POA: Diagnosis not present

## 2021-02-05 LAB — POCT GLYCOSYLATED HEMOGLOBIN (HGB A1C): HbA1c, POC (controlled diabetic range): 6.1 % (ref 0.0–7.0)

## 2021-02-05 MED ORDER — LOSARTAN POTASSIUM 100 MG PO TABS: 100.0000 mg | ORAL_TABLET | Freq: Every day | ORAL | 1 refills | Status: DC

## 2021-02-05 MED ORDER — TRAMADOL HCL 50 MG PO TABS: 50.0000 mg | ORAL_TABLET | Freq: Four times a day (QID) | ORAL | 0 refills | Status: DC | PRN

## 2021-02-05 MED ORDER — MECLIZINE HCL 12.5 MG PO TABS: 12.5000 mg | ORAL_TABLET | Freq: Three times a day (TID) | ORAL | 0 refills | Status: DC | PRN

## 2021-02-05 NOTE — Patient Instructions (Addendum)
It was great seeing you today!  Continue work with Actor. Update him regarding your symptoms as needed. Be sure to follow up with your eye doctor to get your peripheral vision checked.   Stop by the pharmacy to pick up your prescriptions.    I'd like to see you back 3 months but if you need to be seen earlier than that for any new issues we're happy to fit you in, just give [name] a call!    If you have questions or concerns please do not hesitate to call at [phone].  Dr. Rushie [facility]

## 2021-02-05 NOTE — Progress Notes (Signed)
   SUBJECTIVE:   CHIEF COMPLAINT / HPI:   Chief Complaint  Patient presents with   Follow-up    Discuss MRI     Erin Coffey is a 75 y.o. female here for to discuss MRI results. Patient continues to have dizziness. Has nagging headache that does not linger for long. Saw neurosurgery and recently got her eyes checked but has to go back for additional testing.   Diabetes Mellitus  Denies missing any doses of DM medications. Denies increased thirst, hunger, or frequent urination.   HTN Denies missing doses of antihypertensive medications. Denies chest pain, palpitations, lower extremity edema, exertional dyspnea.  Exercises:  not active Low salt diet: yes     PERTINENT  PMH / PSH: reviewed and updated as appropriate   OBJECTIVE:   BP (!) 148/64   Pulse (!) 58   Ht 5\' 9"  (1.753 m)   Wt 294 lb 4 oz (133.5 kg)   SpO2 99%   BMI 43.45 kg/m    GEN: well appearing female, in no acute distress  CV: regular rate and rhythm RESP: no increased work of breathing, clear to ascultation bilaterally MSK: no edema, or cyanosis noted SKIN: warm, dry NEURO: CN 2-12 grossly intact, strength 5/5 bilateral upper and lower extremities, moves all extremities appropriately PSYCH: Normal affect, appropriate speech and behavior    ASSESSMENT/PLAN:   Pituitary mass (HCC) Pt has MRI Brain on 01/12/21 which showed, 17 x 15 x 10 mm pituitary mass consistent with an adenoma. There was suprasellar extension with chiasmatic displacement and closely associated with the right cavernous sinus, although likely no invasion. She followed up with neurosurgeon, Dr. Christella Noa. Discussed follow up with Dr. Benjamine Mola.  Referral placed for ENT as she is symptomatic and may need resection. Follows with Dr. Sabra Heck (eye) and has appt to evaluate peripheral vision.   HYPERTENSION, BENIGN ESSENTIAL BP at near goal. Do not want to drop DBP <60. Continue amlodipine, losartan  Type 2 diabetes mellitus with hemoglobin A1c  goal of less than 7.5% (HCC) Well controlled with diet. A1c today 6.1 and previously 6.0 in Mar 2022. Continue Lipitor. Foot exam and urine microalbumin UTD. Pneumococcal vaccine UTD.      [name], DO PGY-2, [facility] 02/05/2021

## 2021-02-06 ENCOUNTER — Encounter: Payer: Self-pay | Admitting: Family Medicine

## 2021-02-07 NOTE — Assessment & Plan Note (Addendum)
BP at near goal. Do not want to drop DBP <60. Continue amlodipine, losartan

## 2021-02-07 NOTE — Assessment & Plan Note (Signed)
Pt has MRI Brain on 01/12/21 which showed, 17 x 15 x 10 mm pituitary mass consistent with an adenoma. There was suprasellar extension with chiasmatic displacement and closely associated with the right cavernous sinus, although likely no invasion. She followed up with neurosurgeon, Dr. Christella Noa. Discussed follow up with Dr. Benjamine Mola.  Referral placed for ENT as she is symptomatic and may need resection. Follows with Dr. Sabra Heck (eye) and has appt to evaluate peripheral vision.

## 2021-02-07 NOTE — Assessment & Plan Note (Signed)
Well controlled with diet. A1c today 6.1 and previously 6.0 in Mar 2022. Continue Lipitor. Foot exam and urine microalbumin UTD. Pneumococcal vaccine UTD.

## 2021-02-12 DIAGNOSIS — M1712 Unilateral primary osteoarthritis, left knee: Secondary | ICD-10-CM | POA: Diagnosis not present

## 2021-02-15 ENCOUNTER — Telehealth: Payer: Self-pay

## 2021-02-15 NOTE — Telephone Encounter (Signed)
Patient LVM on nurse line checking the status of ENT referral for vertigo. Referral was placed, however Dr. Lucia Gaskins is no longer taking new patients for vertigo. Will forward to our referral coordinator.

## 2021-02-15 NOTE — Telephone Encounter (Signed)
Spoke to patient and let her know that referral was faxed to [facility] and that they should be contacting her in the next week or so for an appt.  Patient voiced understanding.  She was scheduled by her neurosurgeon with Dr. Deeann Saint office but that is not until September so we will wait and cancel that appt if [facility] can get her in sooner than that.  Nabila Albarracin,CMA

## 2021-02-19 DIAGNOSIS — M1712 Unilateral primary osteoarthritis, left knee: Secondary | ICD-10-CM | POA: Diagnosis not present

## 2021-03-11 ENCOUNTER — Other Ambulatory Visit: Payer: Self-pay | Admitting: Family Medicine

## 2021-03-11 DIAGNOSIS — E119 Type 2 diabetes mellitus without complications: Secondary | ICD-10-CM

## 2021-03-11 DIAGNOSIS — M8000XA Age-related osteoporosis with current pathological fracture, unspecified site, initial encounter for fracture: Secondary | ICD-10-CM

## 2021-03-11 DIAGNOSIS — I2699 Other pulmonary embolism without acute cor pulmonale: Secondary | ICD-10-CM

## 2021-03-11 DIAGNOSIS — Z8781 Personal history of (healed) traumatic fracture: Secondary | ICD-10-CM

## 2021-03-11 MED ORDER — ALENDRONATE SODIUM 70 MG PO TABS: ORAL_TABLET | ORAL | 3 refills | Status: DC

## 2021-03-11 MED ORDER — LOSARTAN POTASSIUM 100 MG PO TABS: 100.0000 mg | ORAL_TABLET | Freq: Every day | ORAL | 1 refills | Status: DC

## 2021-03-18 LAB — HM DIABETES EYE EXAM

## 2021-03-25 DIAGNOSIS — N189 Chronic kidney disease, unspecified: Secondary | ICD-10-CM | POA: Diagnosis not present

## 2021-03-25 DIAGNOSIS — N1832 Chronic kidney disease, stage 3b: Secondary | ICD-10-CM | POA: Diagnosis not present

## 2021-04-01 DIAGNOSIS — E1122 Type 2 diabetes mellitus with diabetic chronic kidney disease: Secondary | ICD-10-CM | POA: Diagnosis not present

## 2021-04-01 DIAGNOSIS — N2581 Secondary hyperparathyroidism of renal origin: Secondary | ICD-10-CM | POA: Diagnosis not present

## 2021-04-01 DIAGNOSIS — R809 Proteinuria, unspecified: Secondary | ICD-10-CM | POA: Diagnosis not present

## 2021-04-01 DIAGNOSIS — M546 Pain in thoracic spine: Secondary | ICD-10-CM | POA: Diagnosis not present

## 2021-04-01 DIAGNOSIS — N1832 Chronic kidney disease, stage 3b: Secondary | ICD-10-CM | POA: Diagnosis not present

## 2021-04-01 DIAGNOSIS — M722 Plantar fascial fibromatosis: Secondary | ICD-10-CM | POA: Diagnosis not present

## 2021-04-01 DIAGNOSIS — I129 Hypertensive chronic kidney disease with stage 1 through stage 4 chronic kidney disease, or unspecified chronic kidney disease: Secondary | ICD-10-CM | POA: Diagnosis not present

## 2021-04-01 DIAGNOSIS — D631 Anemia in chronic kidney disease: Secondary | ICD-10-CM | POA: Diagnosis not present

## 2021-04-01 DIAGNOSIS — M545 Low back pain, unspecified: Secondary | ICD-10-CM | POA: Diagnosis not present

## 2021-04-01 DIAGNOSIS — M47816 Spondylosis without myelopathy or radiculopathy, lumbar region: Secondary | ICD-10-CM | POA: Diagnosis not present

## 2021-04-03 ENCOUNTER — Encounter: Payer: Self-pay | Admitting: Family Medicine

## 2021-04-04 ENCOUNTER — Telehealth: Payer: Self-pay

## 2021-04-04 DIAGNOSIS — H8112 Benign paroxysmal vertigo, left ear: Secondary | ICD-10-CM | POA: Insufficient documentation

## 2021-04-04 DIAGNOSIS — M6281 Muscle weakness (generalized): Secondary | ICD-10-CM | POA: Diagnosis not present

## 2021-04-04 DIAGNOSIS — M47816 Spondylosis without myelopathy or radiculopathy, lumbar region: Secondary | ICD-10-CM | POA: Diagnosis not present

## 2021-04-04 DIAGNOSIS — M545 Low back pain, unspecified: Secondary | ICD-10-CM | POA: Diagnosis not present

## 2021-04-04 NOTE — Telephone Encounter (Signed)
LVM to have pt call back to schedule AWV.   RE: confirm insurance and schedule AWV on my schedule if times are convenient for patient or other AWV schedule as template permits.   

## 2021-04-08 ENCOUNTER — Other Ambulatory Visit: Payer: Self-pay | Admitting: Family Medicine

## 2021-04-08 DIAGNOSIS — M545 Low back pain, unspecified: Secondary | ICD-10-CM

## 2021-04-08 DIAGNOSIS — G8929 Other chronic pain: Secondary | ICD-10-CM

## 2021-04-09 DIAGNOSIS — S32010A Wedge compression fracture of first lumbar vertebra, initial encounter for closed fracture: Secondary | ICD-10-CM | POA: Diagnosis not present

## 2021-04-09 DIAGNOSIS — D497 Neoplasm of unspecified behavior of endocrine glands and other parts of nervous system: Secondary | ICD-10-CM | POA: Diagnosis not present

## 2021-04-09 DIAGNOSIS — I10 Essential (primary) hypertension: Secondary | ICD-10-CM | POA: Diagnosis not present

## 2021-04-18 DIAGNOSIS — M47816 Spondylosis without myelopathy or radiculopathy, lumbar region: Secondary | ICD-10-CM | POA: Diagnosis not present

## 2021-04-18 DIAGNOSIS — M6281 Muscle weakness (generalized): Secondary | ICD-10-CM | POA: Diagnosis not present

## 2021-04-18 DIAGNOSIS — M545 Low back pain, unspecified: Secondary | ICD-10-CM | POA: Diagnosis not present

## 2021-04-25 DIAGNOSIS — M6281 Muscle weakness (generalized): Secondary | ICD-10-CM | POA: Diagnosis not present

## 2021-04-25 DIAGNOSIS — M545 Low back pain, unspecified: Secondary | ICD-10-CM | POA: Diagnosis not present

## 2021-04-25 DIAGNOSIS — M47816 Spondylosis without myelopathy or radiculopathy, lumbar region: Secondary | ICD-10-CM | POA: Diagnosis not present

## 2021-05-02 ENCOUNTER — Other Ambulatory Visit: Payer: Self-pay

## 2021-05-02 ENCOUNTER — Emergency Department (HOSPITAL_COMMUNITY)
Admission: EM | Admit: 2021-05-02 | Discharge: 2021-05-03 | Disposition: A | Discharge status: Home / Self Care | Payer: Medicare Other | Attending: Emergency Medicine | Admitting: Emergency Medicine

## 2021-05-02 ENCOUNTER — Encounter (HOSPITAL_COMMUNITY): Payer: Self-pay | Admitting: Emergency Medicine

## 2021-05-02 DIAGNOSIS — I2510 Atherosclerotic heart disease of native coronary artery without angina pectoris: Secondary | ICD-10-CM | POA: Diagnosis not present

## 2021-05-02 DIAGNOSIS — E1122 Type 2 diabetes mellitus with diabetic chronic kidney disease: Secondary | ICD-10-CM | POA: Diagnosis not present

## 2021-05-02 DIAGNOSIS — R519 Headache, unspecified: Secondary | ICD-10-CM | POA: Insufficient documentation

## 2021-05-02 DIAGNOSIS — Z79899 Other long term (current) drug therapy: Secondary | ICD-10-CM | POA: Diagnosis not present

## 2021-05-02 DIAGNOSIS — R0602 Shortness of breath: Secondary | ICD-10-CM | POA: Diagnosis not present

## 2021-05-02 DIAGNOSIS — Z20822 Contact with and (suspected) exposure to covid-19: Secondary | ICD-10-CM | POA: Diagnosis not present

## 2021-05-02 DIAGNOSIS — M47816 Spondylosis without myelopathy or radiculopathy, lumbar region: Secondary | ICD-10-CM | POA: Diagnosis not present

## 2021-05-02 DIAGNOSIS — Z7901 Long term (current) use of anticoagulants: Secondary | ICD-10-CM | POA: Diagnosis not present

## 2021-05-02 DIAGNOSIS — I129 Hypertensive chronic kidney disease with stage 1 through stage 4 chronic kidney disease, or unspecified chronic kidney disease: Secondary | ICD-10-CM | POA: Diagnosis not present

## 2021-05-02 DIAGNOSIS — R072 Precordial pain: Secondary | ICD-10-CM | POA: Diagnosis not present

## 2021-05-02 DIAGNOSIS — K449 Diaphragmatic hernia without obstruction or gangrene: Secondary | ICD-10-CM

## 2021-05-02 DIAGNOSIS — M545 Low back pain, unspecified: Secondary | ICD-10-CM | POA: Diagnosis not present

## 2021-05-02 DIAGNOSIS — M6281 Muscle weakness (generalized): Secondary | ICD-10-CM | POA: Diagnosis not present

## 2021-05-02 DIAGNOSIS — R079 Chest pain, unspecified: Secondary | ICD-10-CM | POA: Diagnosis not present

## 2021-05-02 DIAGNOSIS — R0789 Other chest pain: Secondary | ICD-10-CM | POA: Diagnosis not present

## 2021-05-02 DIAGNOSIS — N1831 Chronic kidney disease, stage 3a: Secondary | ICD-10-CM | POA: Diagnosis not present

## 2021-05-02 DIAGNOSIS — S0993XA Unspecified injury of face, initial encounter: Secondary | ICD-10-CM | POA: Diagnosis not present

## 2021-05-02 DIAGNOSIS — I6782 Cerebral ischemia: Secondary | ICD-10-CM | POA: Diagnosis not present

## 2021-05-02 LAB — CBC WITH DIFFERENTIAL/PLATELET
Abs Immature Granulocytes: 0.05 10*3/uL (ref 0.00–0.07)
Basophils Absolute: 0 10*3/uL (ref 0.0–0.1)
Basophils Relative: 0 %
Eosinophils Absolute: 0.1 10*3/uL (ref 0.0–0.5)
Eosinophils Relative: 2 %
HCT: 34.4 % — ABNORMAL LOW (ref 36.0–46.0)
Hemoglobin: 11.2 g/dL — ABNORMAL LOW (ref 12.0–15.0)
Immature Granulocytes: 1 %
Lymphocytes Relative: 37 %
Lymphs Abs: 2 10*3/uL (ref 0.7–4.0)
MCH: 30.1 pg (ref 26.0–34.0)
MCHC: 32.6 g/dL (ref 30.0–36.0)
MCV: 92.5 fL (ref 80.0–100.0)
Monocytes Absolute: 0.6 10*3/uL (ref 0.1–1.0)
Monocytes Relative: 11 %
Neutro Abs: 2.7 10*3/uL (ref 1.7–7.7)
Neutrophils Relative %: 49 %
Platelets: 218 10*3/uL (ref 150–400)
RBC: 3.72 MIL/uL — ABNORMAL LOW (ref 3.87–5.11)
RDW: 15.2 % (ref 11.5–15.5)
WBC: 5.5 10*3/uL (ref 4.0–10.5)
nRBC: 0 % (ref 0.0–0.2)

## 2021-05-02 MED ORDER — ASPIRIN 81 MG PO CHEW
324.0000 mg | CHEWABLE_TABLET | Freq: Once | ORAL | Status: AC
  Administered 2021-05-02: 324 mg via ORAL
  Filled 2021-05-02: qty 4

## 2021-05-02 MED ORDER — ONDANSETRON 4 MG PO TBDP
4.0000 mg | ORAL_TABLET | Freq: Once | ORAL | Status: AC
  Administered 2021-05-02: 4 mg via ORAL
  Filled 2021-05-02: qty 1

## 2021-05-02 NOTE — ED Triage Notes (Signed)
Patient reports central chest pain with SOB , nausea , headache and dizziness onset 7pm this evening .

## 2021-05-02 NOTE — ED Provider Notes (Signed)
Emergency Medicine Provider Triage Evaluation Note  Erin Coffey , a 75 y.o. female  was evaluated in triage.  Pt complains of chest pain central and nonradiating.  Onset around 7 PM.  Regarding.  History of pituitary tumor reports that this evening she also developed nausea, lightheadedness.  She is vaccinated for COVID.  No known sick contacts.  Medical record shows history of pulmonary embolism on Eliquis. Which she has been taking all of these medications as prescribed..  Review of Systems  Positive: Chest pain, lightheadedness, nausea Negative: Vomiting, syncope  Physical Exam  BP (!) 165/70   Pulse 70   Resp (!) 26   Ht 5\' 8"  (1.727 m)   Wt (!) 145 kg   SpO2 100%   BMI 48.61 kg/m  Gen:   Awake, no distress   Resp:  Normal effort  MSK:   Moves extremities without difficulty, mild swelling of the right foot Other:  Abdomen soft and nontender  Medical Decision Making  Medically screening exam initiated at 11:23 PM.  Appropriate orders placed.  [name] was informed that the remainder of the evaluation will be completed by another provider, this initial triage assessment does not replace that evaluation, and the importance of remaining in the ED until their evaluation is complete.  Chest pain.  Labs, COVID test, chest x-ray pending.    EKG without acute ischemia noted.   Paxtyn Wisdom, Gwenlyn Perking 05/02/21 2326    Palumbo, April, MD 05/02/21 2349

## 2021-05-03 ENCOUNTER — Ambulatory Visit: Payer: Medicare Other | Admitting: Podiatry

## 2021-05-03 ENCOUNTER — Emergency Department (HOSPITAL_COMMUNITY): Payer: Medicare Other

## 2021-05-03 ENCOUNTER — Encounter (HOSPITAL_COMMUNITY): Payer: Self-pay | Admitting: Emergency Medicine

## 2021-05-03 DIAGNOSIS — K449 Diaphragmatic hernia without obstruction or gangrene: Secondary | ICD-10-CM | POA: Diagnosis not present

## 2021-05-03 DIAGNOSIS — I6782 Cerebral ischemia: Secondary | ICD-10-CM | POA: Diagnosis not present

## 2021-05-03 DIAGNOSIS — R0602 Shortness of breath: Secondary | ICD-10-CM | POA: Diagnosis not present

## 2021-05-03 DIAGNOSIS — R0789 Other chest pain: Secondary | ICD-10-CM | POA: Diagnosis not present

## 2021-05-03 DIAGNOSIS — S0993XA Unspecified injury of face, initial encounter: Secondary | ICD-10-CM | POA: Diagnosis not present

## 2021-05-03 DIAGNOSIS — R079 Chest pain, unspecified: Secondary | ICD-10-CM | POA: Diagnosis not present

## 2021-05-03 LAB — COMPREHENSIVE METABOLIC PANEL
ALT: 13 U/L (ref 0–44)
AST: 21 U/L (ref 15–41)
Albumin: 3.3 g/dL — ABNORMAL LOW (ref 3.5–5.0)
Alkaline Phosphatase: 51 U/L (ref 38–126)
Anion gap: 7 (ref 5–15)
BUN: 18 mg/dL (ref 8–23)
CO2: 22 mmol/L (ref 22–32)
Calcium: 9.6 mg/dL (ref 8.9–10.3)
Chloride: 107 mmol/L (ref 98–111)
Creatinine, Ser: 1.29 mg/dL — ABNORMAL HIGH (ref 0.44–1.00)
GFR, Estimated: 44 mL/min — ABNORMAL LOW (ref >60)
Glucose, Bld: 138 mg/dL — ABNORMAL HIGH (ref 70–99)
Potassium: 4.9 mmol/L (ref 3.5–5.1)
Sodium: 136 mmol/L (ref 135–145)
Total Bilirubin: 0.6 mg/dL (ref 0.3–1.2)
Total Protein: 8 g/dL (ref 6.5–8.1)

## 2021-05-03 LAB — PROTIME-INR
INR: 1.2 (ref 0.8–1.2)
Prothrombin Time: 14.7 seconds (ref 11.4–15.2)

## 2021-05-03 LAB — RESP PANEL BY RT-PCR (FLU A&B, COVID) ARPGX2
Influenza A by PCR: NEGATIVE
Influenza B by PCR: NEGATIVE
SARS Coronavirus 2 by RT PCR: NEGATIVE

## 2021-05-03 LAB — TROPONIN I (HIGH SENSITIVITY)
Troponin I (High Sensitivity): 7 ng/L (ref <18)
Troponin I (High Sensitivity): 7 ng/L (ref <18)

## 2021-05-03 LAB — LIPASE, BLOOD: Lipase: 33 U/L (ref 11–51)

## 2021-05-03 MED ORDER — IOHEXOL 350 MG/ML SOLN
80.0000 mL | Freq: Once | INTRAVENOUS | Status: AC | PRN
  Administered 2021-05-03: 80 mL via INTRAVENOUS

## 2021-05-03 MED ORDER — OMEPRAZOLE 20 MG PO CPDR: 20.0000 mg | DELAYED_RELEASE_CAPSULE | Freq: Every day | ORAL | 0 refills | Status: AC

## 2021-05-03 NOTE — ED Provider Notes (Signed)
[facility] EMERGENCY DEPARTMENT Provider Note   CSN: [id number] Arrival date & time: 05/02/21  2306     History Chief Complaint  Patient presents with   Chest Pain   Shortness of Breath    Erin Coffey is a 75 y.o. female.  The history is provided by the patient.  Chest Pain Pain location:  Substernal area Pain quality: dull   Pain radiates to:  Does not radiate Pain severity:  Moderate Onset quality:  Sudden Duration: hours. Timing:  Constant Progression:  Resolved Chronicity:  Recurrent Context: at rest   Context comment:  While panicking Relieved by:  Nothing Worsened by:  Nothing Ineffective treatments:  None tried Associated symptoms: no AICD problem, no altered mental status, no anorexia, no anxiety, no back pain, no dizziness, no fatigue, no heartburn, no lower extremity edema, no nausea, no near-syncope, no numbness, no orthopnea, no palpitations, no vomiting and no weakness   Risk factors: hypertension   Patient was panicking over her pituitary adenoma that is being followed by Dr Cyndy Freeze and developed CP and SOb at rest.  No exertional symptoms.  No travel, no leg pain or swelling.      Past Medical History:  Diagnosis Date   Allergy    Anemia, iron deficiency    ANXIETY, SITUATIONAL 04/06/2009   Qualifier: Diagnosis of  By: [name]  MD, Stephanie     Arthritis    Back pain, thoracic 12/20/2012   Carpal tunnel syndrome 12/07/2014   Cholelithiasis 06/25/2012   Chronic kidney disease    DM type 2 goal A1C below 7.5 06/05/2008   Qualifier: Diagnosis of  By: [name]  MD, Stephanie     GASTROESOPHAGEAL REFLUX, NO ESOPHAGITIS 10/08/2006   Qualifier: Diagnosis of  By: Griffin Dakin, unspecified 10/21/2007   Qualifier: Diagnosis of  By: [name] MD, [name]     History of DVT (deep vein thrombosis) 05/05/2013   on Eliquis   HYPERLIPIDEMIA 08/08/2008   Qualifier: Diagnosis of  By: [name]  MD, Stephanie     HYPERTENSION, BENIGN ESSENTIAL  11/27/2006   Qualifier: Diagnosis of  ByHoy Morn MD, [name]     Mild coronary artery disease 10/2019   Coronary CT Angiogram: Coronary calcium score 46.Normal coronary origin.  Left dominance with tortuous coronary arteries.  Nondominant RCA - prox < 25% followed by ectasia (dilated to 5.5 mm) mixed plaque in distal vessel.  LM- normal . LAD: minimal mixed plaque in ostial & proximal (<25%) with brief IM bridging in mLAD. Mild mid-distal (25-49%), RI - prox <25%, Large LCx- minimal (<25%) mid-dista   Mild coronary artery disease    Personal history of colonic polyps 10/24/2009   Qualifier: Diagnosis of  By: [name] MD, Sandy Salaam    [place] 08/08/2009   Qualifier: Diagnosis of  By: [name], [name]     Polymyalgia rheumatica ([place]) 10/02/2009   Qualifier: Diagnosis of  By: [name] MD, Apolonio Schneiders     Pulmonary embolism [facility]) - On long Term anticoagulation (Z79.01) 04/28/2011   Converted from warfarin to Eliquis; on long-term anticoagulation.   Thyroid disease    Trigger ring finger of left hand 11/02/2014    Patient Active Problem List   Diagnosis Date Noted   Peripheral vertigo 01/15/2021   Pituitary mass ([place]) 12/31/2020   Left anterior knee pain 12/13/2020   Chronic kidney disease, stage 3a ([place]) 12/13/2020   Sciatic nerve pain, left 05/30/2020   Boil of buttock 04/19/2020   Body mass  index (BMI) 40.0-44.9, adult ([place]) 01/26/2020   Mild coronary artery disease 10/2019   History of spinal fracture 03/24/2019   Age-related osteoporosis with current pathological fracture 03/24/2019   Nontraumatic complete tear of left rotator cuff 03/24/2019   Primary hypercoagulable state ([place]) 03/07/2019   Osteoporotic compression fracture of spine ([place]) 03/07/2019   Pulmonary emboli ([place]) 03/03/2019   Chronic left hip pain 08/17/2018   Left sided numbness 07/22/2018   Onychogryphosis 05/31/2018   Tinea pedis of right foot 05/31/2018   Low back pain potentially associated with radiculopathy 01/29/2018    Diverticulosis of colon 03/24/2017   Hypertensive retinopathy of right eye 01/20/2017   Hypermetropia of both eyes 01/20/2017   Back pain 01/05/2017   Chronic kidney disease 04/29/2016   Normocytic anemia 04/29/2016   Dark stools 10/19/2015   Rotator cuff dysfunction 06/25/2015   Bruise 12/07/2014   Long term (current) use of anticoagulants 05/08/2011   Hyperlipidemia associated with type 2 diabetes mellitus ([place]) 08/08/2008   Type 2 diabetes mellitus with hemoglobin A1c goal of less than 7.5% ([place]) 06/05/2008   HYPERTENSION, BENIGN ESSENTIAL 11/27/2006   Morbid (severe) obesity due to excess calories ([place]) 10/08/2006   GASTROESOPHAGEAL REFLUX, NO ESOPHAGITIS 10/08/2006   Osteoarthritis, multiple sites 10/08/2006    Past Surgical History:  Procedure Laterality Date   ABDOMINAL HYSTERECTOMY     CARDIAC CATHETERIZATION  04/29/11   (Dr. Lia Foyer - [facility]): LM - normal. LAD with 1 major Diag - normal, bifurcating Ramus - normal, Large (6-7 mm) Dominant LCx with 1 OM, 2 LPL branches & LPDA - non significant disease; small non-dominant RCA.     CARDIAC EVENT MONITOR  09/2019   Mostly sinus rhythm, average rate 60 bpm.  Range 45 to 190 bpm.  No critical events indicating any arrhythmias or pauses.  For patient responses noted PVCs.  Less than 1% PVCs noted.    CARPAL TUNNEL RELEASE Right 1996   COLONOSCOPY     KNEE ARTHROSCOPY  Bilateral   TRANSTHORACIC ECHOCARDIOGRAM  09/2019   Normal EF - 60 to 65%.  Mild LVH.  GR 1 DD.  Normal RV.  Trivial MR.  Normal IVC.  Normal PA pressures.   TUBAL LIGATION     UPPER GASTROINTESTINAL ENDOSCOPY       OB History   No obstetric history on file.     Family History  Problem Relation Age of Onset   Diabetes Mother    Heart disease Mother    Hypertension Mother    Heart disease Father    Hypertension Father    Diabetes Sister    Heart disease Sister    Hypertension Brother    Cancer Sister 52       lukemia   Cirrhosis Sister         alcohol   Kidney disease Sister    Diabetes Sister    Diabetes Brother    Colon cancer Neg Hx    Pancreatic cancer Neg Hx    Esophageal cancer Neg Hx     Social History   Tobacco Use   Smoking status: Never   Smokeless tobacco: Never  Vaping Use   Vaping Use: Never used  Substance Use Topics   Alcohol use: No    Alcohol/week: 0.0 standard drinks   Drug use: No    Home Medications Prior to Admission medications   Medication Sig Start Date End Date Taking? Authorizing Provider  apixaban (ELIQUIS) 5 MG TABS tablet Take 1 tablet (5 mg  total) by mouth 2 (two) times daily. 03/11/21   [name], DO  acetaminophen (TYLENOL) 650 MG CR tablet Take 1,300 mg by mouth every 8 (eight) hours as needed for pain.    [provider]  alendronate (FOSAMAX) 70 MG tablet TAKE 1 TABLET BY MOUTH  EVERY 7 DAYS WITH A FULL  GLASS OF WATER ON AN EMPTY  STOMACH 03/11/21   Brimage, Ronnette Juniper, DO  amLODipine (NORVASC) 5 MG tablet TAKE 1 TABLET BY MOUTH AT  BEDTIME 01/11/21   Brimage, Vondra, DO  atorvastatin (LIPITOR) 40 MG tablet TAKE 1 TABLET BY MOUTH  DAILY 01/11/21   Brimage, Vondra, DO  baclofen (LIORESAL) 10 MG tablet Take 1 tablet (10 mg total) by mouth daily as needed for muscle spasms. 03/27/20   [name], DO  Blood Glucose Monitoring Suppl (ONETOUCH VERIO) w/Device KIT Use as instructed to test sugars once daily.  Dx Code: E11.9 02/10/19   Martyn Malay, MD  CHOLECALCIFEROL PO Take 2,200 Units by mouth daily.    [provider]  ELDERBERRY PO Take 2 g by mouth daily at 12 noon.    [provider]  fish oil-omega-3 fatty acids 1000 MG capsule Take 1 g by mouth daily.    [provider]  gabapentin (NEURONTIN) 100 MG capsule TAKE 1 CAPSULE BY MOUTH  TWICE DAILY Patient taking differently: Take 100 mg by mouth 2 (two) times daily. 05/01/20   Brimage, Ronnette Juniper, DO  glucose blood (ONETOUCH VERIO) test strip Use as instructed to test sugars once daily.  Dx Code: E11.9  10/29/20   [name], DO  iron polysaccharides (NIFEREX) 150 MG capsule     [provider]  ketoconazole (NIZORAL) 2 % cream Apply to both feet once daily for 4 weeks. 10/16/20   Marzetta Board, DPM  Lancet Devices (ONE TOUCH DELICA LANCING DEV) MISC Use as instructed to test sugars once daily.  Dx Code: E11.9 10/29/20   [name], DO  losartan (COZAAR) 100 MG tablet Take 1 tablet (100 mg total) by mouth daily. 03/11/21 09/07/21  [name], DO  meclizine (ANTIVERT) 12.5 MG tablet Take 1 tablet (12.5 mg total) by mouth 3 (three) times daily as needed for dizziness. 02/05/21   Brimage, Ronnette Juniper, DO  metoprolol tartrate (LOPRESSOR) 25 MG tablet TAKE 1 TABLET BY MOUTH  TWICE DAILY Patient taking differently: Take 25 mg by mouth 2 (two) times daily. 05/01/20   [name], DO  mupirocin cream (BACTROBAN) 2 % Apply 1 application topically 2 (two) times daily. 04/19/20   [name], DO  omeprazole (PRILOSEC OTC) 20 MG tablet     [provider]  omeprazole (PRILOSEC) 20 MG capsule Take 20 mg by mouth daily.    [provider]  OneTouch Delica Lancets 32P MISC Use as instructed to test sugars once daily.  Dx Code: E11.9 02/10/19   Martyn Malay, MD  polyethylene glycol powder (GLYCOLAX/MIRALAX) powder Take 17 g by mouth 2 (two) times daily as needed. Patient taking differently: Take 17 g by mouth daily as needed (constipation). 08/17/18   Myles Gip, DO  traMADol (ULTRAM) 50 MG tablet TAKE 1 TABLET BY MOUTH EVERY 6 HOURS AS NEEDED 04/08/21   Brimage, Ronnette Juniper, DO  triamcinolone (KENALOG) 0.1 % Apply 1 application topically daily as needed. Apply to rash 10/29/20   [name], DO  valsartan (DIOVAN) 320 MG tablet     [provider]  vitamin C (ASCORBIC ACID) 500 MG tablet Take 500  mg by mouth daily.    [provider]    Allergies    Lisinopril  Review of Systems   Review of Systems  Constitutional:  Negative for fatigue.  HENT:   Negative for drooling.   Eyes:  Negative for photophobia, redness and visual disturbance.  Respiratory:  Negative for wheezing and stridor.   Cardiovascular:  Positive for chest pain. Negative for palpitations, orthopnea and near-syncope.  Gastrointestinal:  Negative for anorexia, heartburn, nausea and vomiting.  Genitourinary:  Negative for difficulty urinating.  Musculoskeletal:  Negative for back pain, neck pain and neck stiffness.  Skin:  Negative for rash.  Neurological:  Negative for dizziness, facial asymmetry, weakness and numbness.  Psychiatric/Behavioral:  Negative for agitation. The patient is nervous/anxious.   All other systems reviewed and are negative.  Physical Exam Updated Vital Signs BP (!) 163/58   Pulse (!) 50   Temp 97.7 F (36.5 C)   Resp 12   Ht $R'5\' 8"'AA$  (1.727 m)   Wt (!) 145 kg   SpO2 100%   BMI 48.61 kg/m   Physical Exam Vitals and nursing note reviewed.  Constitutional:      Appearance: Normal appearance. She is not ill-appearing or diaphoretic.  HENT:     Head: Normocephalic and atraumatic.     Nose: Nose normal.     Mouth/Throat:     Mouth: Mucous membranes are moist.     Pharynx: Oropharynx is clear.  Eyes:     Extraocular Movements: Extraocular movements intact.     Conjunctiva/sclera: Conjunctivae normal.     Pupils: Pupils are equal, round, and reactive to light.     Comments: No proptosis intact cognition   Cardiovascular:     Rate and Rhythm: Normal rate and regular rhythm.     Pulses: Normal pulses.     Heart sounds: Normal heart sounds.  Pulmonary:     Effort: Pulmonary effort is normal.     Breath sounds: Normal breath sounds.  Abdominal:     General: Abdomen is flat. Bowel sounds are normal.     Palpations: Abdomen is soft.     Tenderness: There is no abdominal tenderness. There is no guarding.  Musculoskeletal:        General: No tenderness. Normal range of motion.     Cervical back: Normal range of motion and neck supple.      Right lower leg: No edema.     Left lower leg: No edema.  Skin:    General: Skin is warm and dry.     Capillary Refill: Capillary refill takes less than 2 seconds.  Neurological:     General: No focal deficit present.     Mental Status: She is alert and oriented to person, place, and time.     Deep Tendon Reflexes: Reflexes normal.  Psychiatric:        Mood and Affect: Mood is anxious.    ED Results / Procedures / Treatments   Labs (all labs ordered are listed, but only abnormal results are displayed) Results for orders placed or performed during the hospital encounter of 05/02/21  Resp Panel by RT-PCR (Flu A&B, Covid) Nasopharyngeal Swab   Specimen: Nasopharyngeal Swab; Nasopharyngeal(NP) swabs in vial transport medium  Result Value Ref Range   SARS Coronavirus 2 by RT PCR NEGATIVE NEGATIVE   Influenza A by PCR NEGATIVE NEGATIVE   Influenza B by PCR NEGATIVE NEGATIVE  CBC with Differential/Platelet  Result Value Ref Range   WBC 5.5 4.0 -  10.5 K/uL   RBC 3.72 (L) 3.87 - 5.11 MIL/uL   Hemoglobin 11.2 (L) 12.0 - 15.0 g/dL   HCT 34.4 (L) 36.0 - 46.0 %   MCV 92.5 80.0 - 100.0 fL   MCH 30.1 26.0 - 34.0 pg   MCHC 32.6 30.0 - 36.0 g/dL   RDW 15.2 11.5 - 15.5 %   Platelets 218 150 - 400 K/uL   nRBC 0.0 0.0 - 0.2 %   Neutrophils Relative % 49 %   Neutro Abs 2.7 1.7 - 7.7 K/uL   Lymphocytes Relative 37 %   Lymphs Abs 2.0 0.7 - 4.0 K/uL   Monocytes Relative 11 %   Monocytes Absolute 0.6 0.1 - 1.0 K/uL   Eosinophils Relative 2 %   Eosinophils Absolute 0.1 0.0 - 0.5 K/uL   Basophils Relative 0 %   Basophils Absolute 0.0 0.0 - 0.1 K/uL   Immature Granulocytes 1 %   Abs Immature Granulocytes 0.05 0.00 - 0.07 K/uL  Comprehensive metabolic panel  Result Value Ref Range   Sodium 136 135 - 145 mmol/L   Potassium 4.9 3.5 - 5.1 mmol/L   Chloride 107 98 - 111 mmol/L   CO2 22 22 - 32 mmol/L   Glucose, Bld 138 (H) 70 - 99 mg/dL   BUN 18 8 - 23 mg/dL   Creatinine, Ser 1.29 (H) 0.44 -  1.00 mg/dL   Calcium 9.6 8.9 - 10.3 mg/dL   Total Protein 8.0 6.5 - 8.1 g/dL   Albumin 3.3 (L) 3.5 - 5.0 g/dL   AST 21 15 - 41 U/L   ALT 13 0 - 44 U/L   Alkaline Phosphatase 51 38 - 126 U/L   Total Bilirubin 0.6 0.3 - 1.2 mg/dL   GFR, Estimated 44 (L) >60 mL/min   Anion gap 7 5 - 15  Lipase, blood  Result Value Ref Range   Lipase 33 11 - 51 U/L  Protime-INR  Result Value Ref Range   Prothrombin Time 14.7 11.4 - 15.2 seconds   INR 1.2 0.8 - 1.2  Troponin I (High Sensitivity)  Result Value Ref Range   Troponin I (High Sensitivity) 7 <18 ng/L  Troponin I (High Sensitivity)  Result Value Ref Range   Troponin I (High Sensitivity) 7 <18 ng/L   DG Chest 2 View  Result Date: 05/03/2021 CLINICAL DATA:  Chest pain for a week. Shortness of breath today. History of pulmonary embolus, diabetes, hypertension EXAM: CHEST - 2 VIEW COMPARISON:  12/26/2020 FINDINGS: Heart size and pulmonary vascularity are normal for technique. Lungs are clear. No pleural effusions. No pneumothorax. Mediastinal contours appear intact. Mild calcification of the aorta. Degenerative changes in the spine. Compression deformity of L1 is unchanged since prior study. IMPRESSION: No active cardiopulmonary disease. Electronically Signed   By: Lucienne Capers M.D.   On: 05/03/2021 00:06    EKG EKG Interpretation  Date/Time:  Thursday May 02 2021 23:16:27 EDT Ventricular Rate:  67 PR Interval:  174 QRS Duration: 82 QT Interval:  396 QTC Calculation: 418 R Axis:   43 Text Interpretation: Normal sinus rhythm Confirmed by Randal Buba, Darriel Utter ([id number]) on 05/03/2021 4:06:51 AM  Radiology DG Chest 2 View  Result Date: 05/03/2021 CLINICAL DATA:  Chest pain for a week. Shortness of breath today. History of pulmonary embolus, diabetes, hypertension EXAM: CHEST - 2 VIEW COMPARISON:  12/26/2020 FINDINGS: Heart size and pulmonary vascularity are normal for technique. Lungs are clear. No pleural effusions. No pneumothorax.  Mediastinal contours appear intact. Mild  calcification of the aorta. Degenerative changes in the spine. Compression deformity of L1 is unchanged since prior study. IMPRESSION: No active cardiopulmonary disease. Electronically Signed   By: Lucienne Capers M.D.   On: 05/03/2021 00:06    Procedures Procedures   Medications Ordered in ED Medications  aspirin chewable tablet 324 mg (324 mg Oral Given 05/02/21 2331)  ondansetron (ZOFRAN-ODT) disintegrating tablet 4 mg (4 mg Oral Given 05/02/21 2332)  iohexol (OMNIPAQUE) 350 MG/ML injection 80 mL (80 mLs Intravenous Contrast Given 05/03/21 0527)    ED Course  I have reviewed the triage vital signs and the nursing notes.  Pertinent labs & imaging results that were available during my care of the patient were reviewed by me and considered in my medical decision making (see chart for details).   Ruled out for MI and PE in the ED.  This is consistent with patient's known anxiety.  Stable for discharge with close follow up with her adenoma specialist.    Erin Coffey was evaluated in Emergency Department on 05/03/2021 for the symptoms described in the history of present illness. She was evaluated in the context of the global COVID-19 pandemic, which necessitated consideration that the patient might be at risk for infection with the SARS-CoV-2 virus that causes COVID-19. Institutional protocols and algorithms that pertain to the evaluation of patients at risk for COVID-19 are in a state of rapid change based on information released by regulatory bodies including the CDC and federal and state organizations. These policies and algorithms were followed during the patient's care in the ED.  Final Clinical Impression(s) / ED Diagnoses Final diagnoses:  None   Return for intractable cough, coughing up blood, fevers > 100.4 unrelieved by medication, shortness of breath, intractable vomiting, chest pain, shortness of breath, weakness, numbness, changes in  speech, facial asymmetry, abdominal pain, passing out, Inability to tolerate liquids or food, cough, altered mental status or any concerns. No signs of systemic illness or infection. The patient is nontoxic-appearing on exam and vital signs are within normal limits. I have reviewed the triage vital signs and the nursing notes. Pertinent labs & imaging results that were available during my care of the patient were reviewed by me and considered in my medical decision making (see chart for details). After history, exam, and medical workup I feel the patient has been appropriately medically screened and is safe for discharge home. Pertinent diagnoses were discussed with the patient. Patient was given return precautions. Rx / DC Orders ED Discharge Orders     None        Dakarri Kessinger, MD 05/03/21 1275

## 2021-05-08 ENCOUNTER — Other Ambulatory Visit: Payer: Self-pay

## 2021-05-08 ENCOUNTER — Encounter: Payer: Self-pay | Admitting: Family Medicine

## 2021-05-08 ENCOUNTER — Ambulatory Visit (INDEPENDENT_AMBULATORY_CARE_PROVIDER_SITE_OTHER): Payer: Medicare Other | Admitting: Family Medicine

## 2021-05-08 VITALS — BP 143/56 | HR 64 | Ht 68.0 in | Wt 294.6 lb

## 2021-05-08 DIAGNOSIS — E119 Type 2 diabetes mellitus without complications: Secondary | ICD-10-CM | POA: Diagnosis not present

## 2021-05-08 DIAGNOSIS — E236 Other disorders of pituitary gland: Secondary | ICD-10-CM

## 2021-05-08 DIAGNOSIS — M545 Low back pain, unspecified: Secondary | ICD-10-CM | POA: Diagnosis not present

## 2021-05-08 DIAGNOSIS — Z23 Encounter for immunization: Secondary | ICD-10-CM

## 2021-05-08 DIAGNOSIS — E1140 Type 2 diabetes mellitus with diabetic neuropathy, unspecified: Secondary | ICD-10-CM | POA: Diagnosis not present

## 2021-05-08 DIAGNOSIS — M25552 Pain in left hip: Secondary | ICD-10-CM

## 2021-05-08 DIAGNOSIS — G8929 Other chronic pain: Secondary | ICD-10-CM

## 2021-05-08 LAB — POCT GLYCOSYLATED HEMOGLOBIN (HGB A1C): HbA1c, POC (controlled diabetic range): 6.3 % (ref 0.0–7.0)

## 2021-05-08 MED ORDER — GABAPENTIN 100 MG PO CAPS: 100.0000 mg | ORAL_CAPSULE | Freq: Two times a day (BID) | ORAL | 0 refills | Status: DC
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
## 2021-05-08 NOTE — Patient Instructions (Addendum)
  It was great seeing you today!  Please check-out at the front desk before leaving the clinic. I'd like to see you back in 3 month but if you need to be seen earlier than that for any new issues we're happy to fit you in, just give [name] a call!  Visit Remembers: - Stop by the pharmacy to pick up your prescriptions  - Continue to work on your healthy eating habits and incorporating exercise into your daily life. (see below) - Your goal is to have an A1c < 8 - Medicine Changes: none - To Do: call to get your test   Diet Recommendations for Diabetes  Carbohydrate includes starch, sugar, and fiber.  Of these, only sugar and starch raise blood glucose.  (Fiber is found in fruits, vegetables [especially skin, seeds, and stalks] and whole grains.)   Starchy (carb) foods: Bread, rice, pasta, potatoes, corn, cereal, grits, crackers, bagels, muffins, all baked goods.  (Fruit, milk, and yogurt also have carbohydrate, but most of these foods will not spike your blood sugar as most starchy foods will.)  A few fruits do cause high blood sugars; use small portions of bananas (limit to 1/2 at a time), grapes, watermelon, oranges, and most tropical fruits.   Protein foods: Meat, fish, poultry, eggs, dairy foods, and beans such as pinto and kidney beans (beans also provide carbohydrate).   1. Eat at least REAL 3 meals and 1-2 snacks per day. Never go more than 4-5 hours while awake without eating. Eat breakfast within the first hour of getting up.   2. Limit starchy foods to TWO per meal and ONE per snack. ONE portion of a starchy food is equal to the following:   - ONE slice of bread (or its equivalent, such as half of a hamburger bun).   - 1/2 cup of a "scoopable" starchy food such as potatoes or rice.   - 15 grams of Total Carbohydrate as shown on food label.   - Every 4 ounces of a sweet drink (including fruit juice). 3. Include at every meal: a protein food, a carb food, and vegetables and/or fruit.   -  Obtain twice the volume of veg's as protein or carbohydrate foods for both lunch and dinner.   - Fresh or frozen veg's are best.   - Keep frozen veg's on hand for a quick vegetable serving.       Please bring all of your medications with you to each visit.    Feel free to call with any questions or concerns at any time, at [phone].   Take care,  Dr. Rushie [facility]

## 2021-05-09 ENCOUNTER — Encounter: Payer: Self-pay | Admitting: Family Medicine

## 2021-05-09 DIAGNOSIS — M545 Low back pain, unspecified: Secondary | ICD-10-CM | POA: Diagnosis not present

## 2021-05-09 DIAGNOSIS — M47816 Spondylosis without myelopathy or radiculopathy, lumbar region: Secondary | ICD-10-CM | POA: Diagnosis not present

## 2021-05-09 DIAGNOSIS — M6281 Muscle weakness (generalized): Secondary | ICD-10-CM | POA: Diagnosis not present

## 2021-05-09 NOTE — Assessment & Plan Note (Deleted)
Stable. Refilled Gabapentin at pt request.
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
## 2021-05-09 NOTE — Assessment & Plan Note (Signed)
Well controlled with diet. A1c today 6.3 and previously 6.1 in June 2022. Continue Lipitor.  Foot exam UTD  Urine microalbumin UTD.  Pneumococcal vaccine UTD.  Eye Exam UTD

## 2021-05-09 NOTE — Assessment & Plan Note (Addendum)
Pt has MRI Brain on 01/12/21 which showed pituitary adenoma with chiasmatic displacement and close association with the right cavernous sinus. Follows with neurosurgeon, Dr. Christella Noa and ENT, Dr. Redmond Baseman. She has completed peripheral vision testing with Dr. Sabra Heck. Continues to work with PT for BPPV.  She has one more study to complete. Advised to follow up with specialist. Defer management to specialists.

## 2021-05-16 DIAGNOSIS — M47816 Spondylosis without myelopathy or radiculopathy, lumbar region: Secondary | ICD-10-CM | POA: Diagnosis not present

## 2021-05-16 DIAGNOSIS — M545 Low back pain, unspecified: Secondary | ICD-10-CM | POA: Diagnosis not present

## 2021-05-16 DIAGNOSIS — M6281 Muscle weakness (generalized): Secondary | ICD-10-CM | POA: Diagnosis not present

## 2021-05-27 ENCOUNTER — Other Ambulatory Visit: Payer: Self-pay | Admitting: Family Medicine

## 2021-05-27 DIAGNOSIS — I2699 Other pulmonary embolism without acute cor pulmonale: Secondary | ICD-10-CM

## 2021-05-30 DIAGNOSIS — M545 Low back pain, unspecified: Secondary | ICD-10-CM | POA: Diagnosis not present

## 2021-05-30 DIAGNOSIS — M546 Pain in thoracic spine: Secondary | ICD-10-CM | POA: Diagnosis not present

## 2021-06-10 ENCOUNTER — Other Ambulatory Visit: Payer: Self-pay | Admitting: Family Medicine

## 2021-06-10 DIAGNOSIS — E1140 Type 2 diabetes mellitus with diabetic neuropathy, unspecified: Secondary | ICD-10-CM

## 2021-06-12 ENCOUNTER — Ambulatory Visit: Payer: Medicare Other | Admitting: Podiatry

## 2021-06-12 ENCOUNTER — Encounter: Payer: Self-pay | Admitting: Podiatry

## 2021-06-12 ENCOUNTER — Other Ambulatory Visit: Payer: Self-pay

## 2021-06-12 DIAGNOSIS — M25471 Effusion, right ankle: Secondary | ICD-10-CM | POA: Diagnosis not present

## 2021-06-12 DIAGNOSIS — M79675 Pain in left toe(s): Secondary | ICD-10-CM | POA: Diagnosis not present

## 2021-06-12 DIAGNOSIS — M79674 Pain in right toe(s): Secondary | ICD-10-CM | POA: Diagnosis not present

## 2021-06-12 DIAGNOSIS — B351 Tinea unguium: Secondary | ICD-10-CM

## 2021-06-12 DIAGNOSIS — M722 Plantar fascial fibromatosis: Secondary | ICD-10-CM | POA: Diagnosis not present

## 2021-06-12 DIAGNOSIS — E119 Type 2 diabetes mellitus without complications: Secondary | ICD-10-CM | POA: Diagnosis not present

## 2021-06-16 NOTE — Progress Notes (Signed)
Subjective: Erin Coffey is a 75 y.o. female patient seen today for preventative diabetic foot care. She is seen for follow up of  painful thick toenails that are difficult to trim. Pain interferes with ambulation. Aggravating factors include wearing enclosed shoe gear. Pain is relieved with periodic professional debridement.  New problems reported today:right ankle swelling and right heel pain since August. Patient denies any preceding episodes of trauma. She works at her [employer] for [employer]. At times, they serve over 300 meals per event.  Patient does not routinely monitor his/her blood glucose.  PCP is Brimage, Ronnette Juniper, DO. Last visit was: 05/08/2021.  Allergies  Allergen Reactions   Lisinopril Cough   Objective: Physical Exam  General: Patient is a pleasant 75 y.o. African American female obese in NAD. AAO x 3.   Neurovascular Examination: CFT <3 seconds b/l LE. Palpable DP pulse(s) b/l lower extremities and right ankle Palpable PT pulse(s) b/l lower extremities No pain with calf compression b/l. Lower extremity skin temperature gradient within normal limits. Trace edema noted right ankle.  Pt has subjective symptoms of neuropathy. Protective sensation intact 5/5 intact bilaterally with 10g monofilament b/l.  Dermatological:  Pedal skin with normal turgor, texture and tone b/l lower extremities. No open wounds b/l LE. No interdigital macerations noted b/l LE. Toenails 1-5 b/l elongated, discolored, dystrophic, thickened, crumbly with subungual debris and tenderness to dorsal palpation.  Musculoskeletal:  Normal muscle strength 5/5 to all lower extremity muscle groups bilaterally. Minimal pain on palpation right medial tubercle of calcaneus today. Pes planus deformity noted b/l lower extremities.  Assessment: 1. Pain due to onychomycosis of toenails of both feet   2. Plantar fasciitis, right   3. Plantar fasciitis of right foot   4. Swelling of right ankle  joint   5. Controlled type 2 diabetes mellitus without complication, without long-term current use of insulin ([place])    Plan: Patient was evaluated and treated and all questions answered. Consent given for treatment as described below: -Continue diabetic foot care principles: inspect feet daily, monitor glucose as recommended by PCP and/or Endocrinologist, and follow prescribed diet per PCP, Endocrinologist and/or dietician. -Toenails 1-5 b/l were debrided in length and girth with sterile nail nippers and dremel without iatrogenic bleeding.  -Patient referred to Dr. Paulla Dolly for evaluation of right ankle swelling and heel pain of right foot. -Patient/POA to call should there be question/concern in the interim.  Return in about 3 months (around 09/12/2021).  Marzetta Board, DPM

## 2021-06-27 ENCOUNTER — Other Ambulatory Visit: Payer: Self-pay

## 2021-06-27 ENCOUNTER — Ambulatory Visit (INDEPENDENT_AMBULATORY_CARE_PROVIDER_SITE_OTHER): Payer: Medicare Other

## 2021-06-27 ENCOUNTER — Ambulatory Visit: Payer: Medicare Other | Admitting: Podiatry

## 2021-06-27 ENCOUNTER — Encounter: Payer: Self-pay | Admitting: Podiatry

## 2021-06-27 DIAGNOSIS — M722 Plantar fascial fibromatosis: Secondary | ICD-10-CM | POA: Diagnosis not present

## 2021-06-27 DIAGNOSIS — M7661 Achilles tendinitis, right leg: Secondary | ICD-10-CM

## 2021-06-27 NOTE — Patient Instructions (Signed)

## 2021-06-27 NOTE — Progress Notes (Signed)
Subjective:   Patient ID: Erin Coffey, female   DOB: 75 y.o.   MRN: [id number]   HPI Patient presents with pain in the right heel of approximate 3 months duration stating it still is sore had some improvement but still concerned about this with obesity is complicating factor   ROS      Objective:  Physical Exam  Neuro vascular status intact with posterior pain of the right heel at the insertional point lateral side with inflammation noted upon palpation chronic     Assessment:  Please tendinitis right with inflammation with obesity is complicating factor     Plan:  H&P reviewed condition and discussed heel lift ice therapy stretching and shoe gear modifications.  If symptoms were to get worse we will need to consider injection immobilization physical therapy or even possible MRI surgery.  All these different issues were described and discussed with patient  X-rays did not indicate significant spur formation appears to be more soft tissue

## 2021-07-03 ENCOUNTER — Other Ambulatory Visit: Payer: Self-pay | Admitting: Family Medicine

## 2021-07-03 DIAGNOSIS — M545 Low back pain, unspecified: Secondary | ICD-10-CM

## 2021-07-26 ENCOUNTER — Ambulatory Visit (INDEPENDENT_AMBULATORY_CARE_PROVIDER_SITE_OTHER): Payer: Medicare Other | Admitting: Family Medicine

## 2021-07-26 ENCOUNTER — Other Ambulatory Visit: Payer: Self-pay

## 2021-07-26 VITALS — BP 149/52 | HR 66 | Temp 99.9°F | Ht 68.0 in | Wt 289.0 lb

## 2021-07-26 DIAGNOSIS — J0100 Acute maxillary sinusitis, unspecified: Secondary | ICD-10-CM | POA: Insufficient documentation

## 2021-07-26 MED ORDER — CETIRIZINE HCL 10 MG PO TABS: 10.0000 mg | ORAL_TABLET | Freq: Every day | ORAL | 11 refills | Status: DC

## 2021-07-26 NOTE — Assessment & Plan Note (Signed)
-  likely due to sinus infection and seasonal allergies -no indication for antibiotics at this time -zyrtec daily prescribed -supportive care

## 2021-07-26 NOTE — Patient Instructions (Signed)
It was great seeing you today!  Today we discussed your symptoms which seem to be due to a sinus infection. Please take zyrtec daily. Using a netti pot can also help as well along with a humidifier.   Please follow up at your next scheduled appointment with your PCP, if anything arises between now and then, please don't hesitate to contact our office.   Thank you for allowing [name] to be a part of your medical care!  Thank you, Dr. Larae Grooms

## 2021-07-26 NOTE — Progress Notes (Signed)
° ° °  SUBJECTIVE:   CHIEF COMPLAINT / HPI:   Patient presents with sinuses and cough that has been ongoing for the past 4 days. 5 days ago she went to church with a wet head after taking a shower. Explains that it is worse at night, headache and facial pressure that feels like a brick. Also experiencing cough with clear phlegm. Endorsing fever but highest temperature was 98. Denies any sick contacts. Denies rhinorrhea or dyspnea. Took tylenol which did not help. No relieving or aggravating factors. Vaccinated against COVID and flu viruses. These symptoms have occurred together before and usually occurs around this time in the year.   OBJECTIVE:   BP (!) 149/52    Pulse 66    Temp 99.9 F (37.7 C)    Ht 5\' 8"  (1.727 m)    Wt 289 lb (131.1 kg)    SpO2 100%    BMI 43.94 kg/m   General: Patient well-appearing, in no acute distress. HEENT: PERRLA, normal buccal mucosa without edema or erythema, non-bulging TMs, no erythema or edema noted, facial tenderness noted along maxillary sinuses bilaterally and forehead  CV: RRR, no murmurs or gallops auscultated Resp: CTAB, no wheezing, rales or rhonchi noted Ext: radial pulses strong and equal bilaterally Psych: mood appropriate   ASSESSMENT/PLAN:   Maxillary sinusitis, acute -likely due to sinus infection and seasonal allergies -no indication for antibiotics at this time -zyrtec daily prescribed -supportive care     Donney Dice, [place]

## 2021-07-30 ENCOUNTER — Other Ambulatory Visit: Payer: Self-pay

## 2021-07-30 ENCOUNTER — Ambulatory Visit (INDEPENDENT_AMBULATORY_CARE_PROVIDER_SITE_OTHER): Payer: Medicare Other | Admitting: Family Medicine

## 2021-07-30 VITALS — BP 141/74 | HR 100 | Wt 291.1 lb

## 2021-07-30 DIAGNOSIS — R059 Cough, unspecified: Secondary | ICD-10-CM | POA: Diagnosis not present

## 2021-07-30 DIAGNOSIS — J069 Acute upper respiratory infection, unspecified: Secondary | ICD-10-CM

## 2021-07-30 DIAGNOSIS — R109 Unspecified abdominal pain: Secondary | ICD-10-CM | POA: Diagnosis not present

## 2021-07-30 LAB — POCT URINALYSIS DIP (MANUAL ENTRY)
Bilirubin, UA: NEGATIVE
Blood, UA: NEGATIVE
Glucose, UA: NEGATIVE mg/dL
Ketones, POC UA: NEGATIVE mg/dL
Leukocytes, UA: NEGATIVE
Nitrite, UA: NEGATIVE
Spec Grav, UA: 1.01 (ref 1.010–1.025)
Urobilinogen, UA: 0.2 E.U./dL
pH, UA: 5.5 (ref 5.0–8.0)

## 2021-07-30 MED ORDER — BENZONATATE 200 MG PO CAPS: 200.0000 mg | ORAL_CAPSULE | Freq: Two times a day (BID) | ORAL | 0 refills | Status: DC | PRN

## 2021-07-30 NOTE — Patient Instructions (Addendum)
It was great to meet you!  Things we discussed today: Your cough. This is most likely an upper respiratory infection caused by a virus. "Upper" meaning it does not involve your lungs. "Virus" meaning we do not use antibiotics to treat it. Unfortunately this can take up to several weeks to improve. Continue using lozenges/cough drops as needed. I have also sent a medication to your pharmacy called Tessalon to help with the cough.  Your urinary symptoms. Your urine does not show any signs of infection today. If your symptoms are not improving over the next 1-2 weeks, or if you develop fever, burning sensation, pain in one side of your back, etc. please let us know.  Take care and seek immediate care sooner if you develop any concerns.  Dr[facility]

## 2021-07-30 NOTE — Progress Notes (Signed)
° ° °  SUBJECTIVE:   CHIEF COMPLAINT / HPI:   Cough Patient presents with cough x8 days. She was seen here 4 days ago (07/26/21) for her symptoms, which also included sinus pressure at that time. She was prescribed Zyrtec and recommended supportive care. Reports her sinus pressure has resolved with the Zyrtec, but her cough is persistent and very bothersome.  The cough is non-productive. She is taking OTC lozenges with no relief. Denies fever, chills, sore throat, shortness of breath, post-tussive emesis, or other symptoms. No known sick contacts. UTD on flu and COVID vaccines.  Urinary Complaints Patient reports suprapubic discomfort immediately after urinating. Discomfort resolves after ~1 minute. Symptoms have been present for the past few days. She thinks it's related to her diverticulosis b/c her urinary symptoms sometimes flare with this. She's had a similar discomfort in the past and it usually resolves. Patient has no dysuria, frequency, or urgency. No bulging or protrusion in her vagina that she's aware of.   PERTINENT  PMH / PSH: T2DM, HTN, HLD, CAD, CKD stage III, hx PE  OBJECTIVE:   BP (!) 141/74    Pulse 100    Wt 291 lb 2 oz (132.1 kg)    SpO2 100%    BMI 44.27 kg/m   Gen: alert, well-appearing, obese, NAD HEENT: oropharynx unremarkable, nares patent Resp: normal work of breathing, lungs CTAB Abd: obese, nontender  ASSESSMENT/PLAN:   Cough Likely secondary to viral URI. No red flags on history, lungs clear bilaterally on exam.  -Reassurance provided -Continue supportive care w/OTC lozenges, honey, Zyrtec, etc -Rx sent for Tessalon -Return if no improvement   Suprapubic Discomfort Acute x2-3 days. Occurs after urinating and lasts ~1 minute. Patient not particularly concerned about these symptoms today- says similar episodes have occurred in the past. UA without any signs of infection. Etiology unclear but fortunately no concern for UTI or pyelo at this time. Pelvic exam  deferred. Return if persistent.   Alcus Dad, MD [place]

## 2021-08-01 ENCOUNTER — Telehealth: Payer: Self-pay

## 2021-08-01 DIAGNOSIS — J329 Chronic sinusitis, unspecified: Secondary | ICD-10-CM

## 2021-08-01 MED ORDER — AMOXICILLIN 500 MG PO TABS: 500.0000 mg | ORAL_TABLET | Freq: Two times a day (BID) | ORAL | 0 refills | Status: AC

## 2021-08-01 NOTE — Telephone Encounter (Signed)
Spoke with patient. Patient reports continued cough. Tessalon has not helped with cough at all. Reports coughing up clear sputum. Denies fever. Reports weakness and fatigue related to persistent cough.   Patient had multiple coughing episodes during conversation. No difficulty breathing or SHOB, patient able to speak in complete sentences.   Counseled patient on antibiotics not being indicated in viral infections. Patient states "something has to be done about this cough"   Patient requested message be sent to PCP. Will also forward to Dr. Rock Nephew, as she saw patient in clinic last for this concern.   Talbot Grumbling, RN

## 2021-08-01 NOTE — Telephone Encounter (Signed)
Called patient and informed. Patient appreciative.   Erin Coffey C Shawnia Vizcarrondo, RN  

## 2021-08-01 NOTE — Telephone Encounter (Signed)
Patient LVM on nurse line requesting antibiotics for ongoing cough since last week. Patient has been seen in office on 12/16 and 12/20. Patient expresses concerns that infection will "get into her lungs" without an antibiotic.   Attempted to call patient to discuss further. No answer, LVM for patient to return call to office.   Talbot Grumbling, RN

## 2021-08-27 ENCOUNTER — Other Ambulatory Visit: Payer: Self-pay | Admitting: Neurosurgery

## 2021-08-27 DIAGNOSIS — D497 Neoplasm of unspecified behavior of endocrine glands and other parts of nervous system: Secondary | ICD-10-CM

## 2021-09-02 ENCOUNTER — Encounter: Payer: Self-pay | Admitting: Family Medicine

## 2021-09-02 ENCOUNTER — Other Ambulatory Visit: Payer: Self-pay

## 2021-09-02 ENCOUNTER — Ambulatory Visit (INDEPENDENT_AMBULATORY_CARE_PROVIDER_SITE_OTHER): Payer: Medicare Other

## 2021-09-02 ENCOUNTER — Ambulatory Visit (INDEPENDENT_AMBULATORY_CARE_PROVIDER_SITE_OTHER): Payer: Medicare Other | Admitting: Family Medicine

## 2021-09-02 VITALS — BP 144/74 | HR 65 | Ht 68.0 in | Wt 289.8 lb

## 2021-09-02 DIAGNOSIS — H8112 Benign paroxysmal vertigo, left ear: Secondary | ICD-10-CM | POA: Diagnosis not present

## 2021-09-02 DIAGNOSIS — H81399 Other peripheral vertigo, unspecified ear: Secondary | ICD-10-CM

## 2021-09-02 DIAGNOSIS — E236 Other disorders of pituitary gland: Secondary | ICD-10-CM

## 2021-09-02 DIAGNOSIS — I10 Essential (primary) hypertension: Secondary | ICD-10-CM

## 2021-09-02 DIAGNOSIS — D649 Anemia, unspecified: Secondary | ICD-10-CM | POA: Diagnosis not present

## 2021-09-02 DIAGNOSIS — R5383 Other fatigue: Secondary | ICD-10-CM | POA: Diagnosis not present

## 2021-09-02 DIAGNOSIS — E1169 Type 2 diabetes mellitus with other specified complication: Secondary | ICD-10-CM | POA: Diagnosis not present

## 2021-09-02 DIAGNOSIS — R42 Dizziness and giddiness: Secondary | ICD-10-CM | POA: Diagnosis not present

## 2021-09-02 DIAGNOSIS — E785 Hyperlipidemia, unspecified: Secondary | ICD-10-CM

## 2021-09-02 DIAGNOSIS — Z23 Encounter for immunization: Secondary | ICD-10-CM | POA: Diagnosis not present

## 2021-09-02 NOTE — Progress Notes (Signed)
° °  SUBJECTIVE:   CHIEF COMPLAINT / HPI:   Chief Complaint  Patient presents with   Vertigo f/u     Erin Coffey is a 76 y.o. female here for dizziness follow up. Reports intermittent room spinning when she lays down that lasts for a few seconds. She closes her eyes and it goes away.   She has been seen by Dr. Redmond Baseman and Dr Christella Noa for pituitary mass. She has an upcoming MRI next month. She rarely has a headache now. Denies vision changes, weakness.  Endorses increased fatigue recently.   She was seen at [facility] for physical therapy. Given steroids to calm inflammation but still was not able to do the "test" that she was supposed to do.     PERTINENT  PMH / PSH: reviewed and updated as appropriate   OBJECTIVE:   BP (!) 160/70    Pulse 65    Ht 5\' 8"  (1.727 m)    Wt 289 lb 12.8 oz (131.5 kg)    SpO2 100%    BMI 44.06 kg/m    GEN: pleasant well appearing female, in no acute distress  EYES: PERRLA, EOM intact  CV: regular rate and rhythm RESP: no increased work of breathing, clear to ascultation bilaterally SKIN: warm, dry NEURO: alert, moves all extremities appropriately, CN 2-12 grossly intact   ASSESSMENT/PLAN:   Peripheral vertigo Follows with ENT. Dr. Redmond Baseman wanted her to see physical therapy. Referral placed to neuro-rehab for vertigo.    Pituitary mass [facility]) Pituitary adenoma seen on MR Brain in July 2022. Follows with Dr Christella Noa.  Has upcoming MR Brain next month.  Completed peripheral vision testing with Dr. Sabra Heck.   HYPERTENSION, BENIGN ESSENTIAL Initial BP 160/70.  Repeat after 10-15 minutes was 144/74. Continue amlodipine 5 mg and Losartan 100 mg  Fatigue Obtain TSH, CBC and BMP.  Cancer screening are UTD.    Hyperlipidemia associated with type 2 diabetes mellitus ([place]) Lipid panel today. Continue Lipitor 40 mg.      [name], DO PGY-3, [facility] 09/02/2021

## 2021-09-02 NOTE — Patient Instructions (Signed)
A referral was placed for rehab. Please call our office if your do not hear anything about scheduling an appointment.     Eating and drinking  Follow a diet as told by your doctor. When you feel better, your doctor may tell you to change your diet. You may need to eat a lot of fiber. Fiber makes it easier to poop (have a bowel movement). Foods with fiber include: Berries. Beans. Lentils. Green vegetables. Avoid eating red meat. General instructions Do not use any products that contain nicotine or tobacco, such as cigarettes, e-cigarettes, and chewing tobacco. If you need help quitting, ask your doctor. Exercise 3 or more times a week. Try to get 30 minutes each time. Exercise enough to sweat and make your heart beat faster. Keep all follow-up visits as told by your doctor. This is important.

## 2021-09-03 DIAGNOSIS — R5383 Other fatigue: Secondary | ICD-10-CM | POA: Diagnosis not present

## 2021-09-03 DIAGNOSIS — D649 Anemia, unspecified: Secondary | ICD-10-CM | POA: Diagnosis not present

## 2021-09-03 LAB — TSH+FREE T4
Free T4: 0.89 ng/dL (ref 0.82–1.77)
TSH: 1.25 u[IU]/mL (ref 0.450–4.500)

## 2021-09-04 DIAGNOSIS — R5383 Other fatigue: Secondary | ICD-10-CM | POA: Insufficient documentation

## 2021-09-04 LAB — BASIC METABOLIC PANEL
BUN/Creatinine Ratio: 12 (ref 12–28)
BUN: 16 mg/dL (ref 8–27)
CO2: 21 mmol/L (ref 20–29)
Calcium: 9.5 mg/dL (ref 8.7–10.3)
Chloride: 103 mmol/L (ref 96–106)
Creatinine, Ser: 1.3 mg/dL — ABNORMAL HIGH (ref 0.57–1.00)
Glucose: 129 mg/dL — ABNORMAL HIGH (ref 70–99)
Potassium: 4.6 mmol/L (ref 3.5–5.2)
Sodium: 139 mmol/L (ref 134–144)
eGFR: 43 mL/min/{1.73_m2} — ABNORMAL LOW (ref >59)

## 2021-09-04 LAB — LIPID PANEL
Chol/HDL Ratio: 2.2 ratio (ref 0.0–4.4)
Cholesterol, Total: 172 mg/dL (ref 100–199)
HDL: 79 mg/dL (ref >39)
LDL Chol Calc (NIH): 82 mg/dL (ref 0–99)
Triglycerides: 55 mg/dL (ref 0–149)
VLDL Cholesterol Cal: 11 mg/dL (ref 5–40)

## 2021-09-04 NOTE — Assessment & Plan Note (Addendum)
Pituitary adenoma seen on MR Brain in July 2022. Follows with Dr Christella Noa.  Has upcoming MR Brain next month.  Completed peripheral vision testing with Dr. Sabra Heck.

## 2021-09-04 NOTE — Assessment & Plan Note (Signed)
Follows with ENT. Dr. Redmond Baseman wanted her to see physical therapy. Referral placed to neuro-rehab for vertigo.

## 2021-09-04 NOTE — Assessment & Plan Note (Deleted)
Follows with ENT. Dr. Redmond Baseman wanted her to see physical therapy. Referral placed to neuro-rehab for vertigo.

## 2021-09-04 NOTE — Assessment & Plan Note (Addendum)
Initial BP 160/70.  Repeat after 10-15 minutes was 144/74. Continue amlodipine 5 mg and Losartan 100 mg

## 2021-09-04 NOTE — Assessment & Plan Note (Signed)
Obtain TSH, CBC and BMP.  Cancer screening are UTD.

## 2021-09-04 NOTE — Assessment & Plan Note (Signed)
Lipid panel today. Continue Lipitor 40 mg.

## 2021-09-14 ENCOUNTER — Ambulatory Visit
Admission: RE | Admit: 2021-09-14 | Discharge: 2021-09-14 | Disposition: A | Discharge status: Home / Self Care | Payer: Medicare Other | Source: Ambulatory Visit | Attending: Neurosurgery | Admitting: Neurosurgery

## 2021-09-14 ENCOUNTER — Other Ambulatory Visit: Payer: Self-pay

## 2021-09-14 DIAGNOSIS — R220 Localized swelling, mass and lump, head: Secondary | ICD-10-CM | POA: Diagnosis not present

## 2021-09-14 DIAGNOSIS — E237 Disorder of pituitary gland, unspecified: Secondary | ICD-10-CM | POA: Diagnosis not present

## 2021-09-14 DIAGNOSIS — D352 Benign neoplasm of pituitary gland: Secondary | ICD-10-CM | POA: Diagnosis not present

## 2021-09-14 DIAGNOSIS — D497 Neoplasm of unspecified behavior of endocrine glands and other parts of nervous system: Secondary | ICD-10-CM

## 2021-09-14 DIAGNOSIS — J341 Cyst and mucocele of nose and nasal sinus: Secondary | ICD-10-CM | POA: Diagnosis not present

## 2021-09-14 MED ORDER — GADOBENATE DIMEGLUMINE 529 MG/ML IV SOLN
10.0000 mL | Freq: Once | INTRAVENOUS | Status: AC | PRN
  Administered 2021-09-14: 10 mL via INTRAVENOUS

## 2021-09-17 ENCOUNTER — Ambulatory Visit: Payer: Medicare Other | Attending: Family Medicine

## 2021-09-17 ENCOUNTER — Other Ambulatory Visit: Payer: Self-pay

## 2021-09-17 DIAGNOSIS — R42 Dizziness and giddiness: Secondary | ICD-10-CM | POA: Insufficient documentation

## 2021-09-17 DIAGNOSIS — R2681 Unsteadiness on feet: Secondary | ICD-10-CM | POA: Diagnosis not present

## 2021-09-17 NOTE — Therapy (Signed)
[facility] [address] [place], [place], [address] Phone: [phone]   Fax:  [phone]  Physical Therapy Evaluation  Patient Details  Name: Erin Coffey MRN: [id number] Date of Birth: [birth year] Referring Provider (PT): Madison Hickman, MD   Encounter Date: 09/17/2021   PT End of Session - 09/17/21 0842     Visit Number 1    Number of Visits 7    Date for PT Re-Evaluation 11/01/21    Authorization Type [employer] Medicare    Progress Note Due on Visit 10    PT Start Time 0845    PT Stop Time 0928    PT Time Calculation (min) 43 min    Activity Tolerance Patient tolerated treatment well    Behavior During Therapy [facility] for tasks assessed/performed             Past Medical History:  Diagnosis Date   Allergy    Anemia, iron deficiency    ANXIETY, SITUATIONAL 04/06/2009   Qualifier: Diagnosis of  By: [name]  MD, Stephanie     Arthritis    Back pain, thoracic 12/20/2012   Carpal tunnel syndrome 12/07/2014   Cholelithiasis 06/25/2012   Chronic kidney disease    DM type 2 goal A1C below 7.5 06/05/2008   Qualifier: Diagnosis of  By: [name]  MD, Wheeler, NO ESOPHAGITIS 10/08/2006   Qualifier: Diagnosis of  By: Griffin Dakin, unspecified 10/21/2007   Qualifier: Diagnosis of  By: [name] MD, [name]     History of DVT (deep vein thrombosis) 05/05/2013   on Eliquis   HYPERLIPIDEMIA 08/08/2008   Qualifier: Diagnosis of  By: [name]  MD, Stephanie     HYPERTENSION, BENIGN ESSENTIAL 11/27/2006   Qualifier: Diagnosis of  ByHoy Morn MD, [name]     Mild coronary artery disease 10/2019   Coronary CT Angiogram: Coronary calcium score 46.Normal coronary origin.  Left dominance with tortuous coronary arteries.  Nondominant RCA - prox < 25% followed by ectasia (dilated to 5.5 mm) mixed plaque in distal vessel.  LM- normal . LAD: minimal mixed plaque in ostial & proximal (<25%) with brief IM bridging in  mLAD. Mild mid-distal (25-49%), RI - prox <25%, Large LCx- minimal (<25%) mid-dista   Mild coronary artery disease    Personal history of colonic polyps 10/24/2009   Qualifier: Diagnosis of  By: [name] MD, Sandy Salaam    [place] 08/08/2009   Qualifier: Diagnosis of  By: [name], [name]     Polymyalgia rheumatica ([place]) 10/02/2009   Qualifier: Diagnosis of  By: [name] MD, Apolonio Schneiders     Pulmonary embolism [facility]) - On long Term anticoagulation (Z79.01) 04/28/2011   Converted from warfarin to Eliquis; on long-term anticoagulation.   Thyroid disease    Trigger ring finger of left hand 11/02/2014    Past Surgical History:  Procedure Laterality Date   ABDOMINAL HYSTERECTOMY     CARDIAC CATHETERIZATION  04/29/11   (Dr. Lia Foyer [facility]): LM - normal. LAD with 1 major Diag - normal, bifurcating Ramus - normal, Large (6-7 mm) Dominant LCx with 1 OM, 2 LPL branches & LPDA - non significant disease; small non-dominant RCA.     CARDIAC EVENT MONITOR  09/2019   Mostly sinus rhythm, average rate 60 bpm.  Range 45 to 190 bpm.  No critical events indicating any arrhythmias or pauses.  For patient responses noted PVCs.  Less than 1% PVCs noted.  CARPAL TUNNEL RELEASE Right 1996   COLONOSCOPY     KNEE ARTHROSCOPY  Bilateral   TRANSTHORACIC ECHOCARDIOGRAM  09/2019   Normal EF - 60 to 65%.  Mild LVH.  GR 1 DD.  Normal RV.  Trivial MR.  Normal IVC.  Normal PA pressures.   TUBAL LIGATION     UPPER GASTROINTESTINAL ENDOSCOPY      There were no vitals filed for this visit.    Subjective Assessment - 09/17/21 0842     Subjective Patient reports she notices the vertigo when she lays back in bed and when she rolls in bed, reports spinning sensation. Reports just brief few seconds. Also feels when she gets up quickly. More noticed when rolling to the L side. Denies vision/hearing changes. Reports some intermittent tinnitus in the left ear. Reports had an ear infection in the L ear when she went to hospital for the  vertigo. Denies sudden changes in sensation/strength, does have some tingling in L arm but this started prior to vertigo. Reports it can affect her balance, but denies falls.    Pertinent History Anemia, Anxiety, Carpal Tunnel, Back Pain, CKD, DM Type 2, Goiter, HTN, HLD, CAD, Thyroid Disease, Pituitary Adenoma    Diagnostic tests Pituitary adenoma seen on MR Brain in July 2022. Follows with Dr Christella Noa. MRI on 09/14/21: Unchanged sellar mass, consistent with pituitary adenoma, with  suprasellar extension and unchanged chiasmatic displacement. No  evidence of invasion of the right cavernous sinus.    Patient Stated Goals Resolve the Vertigo.    Currently in Pain? No/denies                [facility] PT Assessment - 09/17/21 0001       Assessment   Medical Diagnosis Vertigo    Referring Provider (PT) Madison Hickman, MD    Hand Dominance Right      Precautions   Precautions Other (comment)    Precaution Comments Anemia, Anxiety, Carpal Tunnel, Back Pain, CKD, DM Type 2, Goiter, HTN, HLD, CAD, Thyroid Disease, Pituitary Adenoma.      Balance Screen   Has the patient fallen in the past 6 months No    Has the patient had a decrease in activity level because of a fear of falling?  No    Is the patient reluctant to leave their home because of a fear of falling?  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     Cognition   Overall Cognitive Status Within Functional Limits for tasks assessed      Sensation   Light Touch  Appears Intact      Coordination   Gross Motor Movements are Fluid and Coordinated Yes      Transfers   Transfers Sit to Stand;Stand to Sit    Sit to Stand 5: Supervision    Stand to Sit 5: Supervision  Ambulation/Gait   Ambulation/Gait Yes    Ambulation/Gait Assistance 5: Supervision;4: Min guard    Ambulation/Gait Assistance Details mild unsteadiness noted with ambulation    Ambulation Distance (Feet) 50 Feet    Assistive device None    Gait Pattern Step-to pattern                    Vestibular Assessment - 09/17/21 0001       Symptom Behavior   Subjective history of current problem See Subjective    Type of Dizziness  Imbalance;Unsteady with head/body turns;Vertigo    Frequency of Dizziness Daily    Duration of Dizziness Frequency    Symptom Nature Positional    Aggravating Factors Lying supine;Sit to stand;Supine to sit;Rolling to left    Relieving Factors Rest;Slow movements    Progression of Symptoms Better      Oculomotor Exam   Oculomotor Alignment Normal    Ocular ROM WNL    Spontaneous Absent    Gaze-induced  Absent    Smooth Pursuits Intact    Saccades Intact   reports mild dizziness     Oculomotor Exam-Fixation Suppressed    Left Head Impulse difficult for patient to relax neck    Right Head Impulse difficult for patient to relax      Vestibulo-Ocular Reflex   VOR 1 Head Only (x 1 viewing) Mild Dizziness. Able to maintain eyes focused    VOR Cancellation Normal      Visual Acuity   Static 5    Dynamic 4   Mild Dizziness     Other Tests   Comments VBI Screen: Negative      Positional Testing   Dix-Hallpike Dix-Hallpike Right;Dix-Hallpike Left    Sidelying Test Sidelying Right;Sidelying Left    Horizontal Canal Testing Horizontal Canal Right;Horizontal Canal Left      Dix-Hallpike Right   Dix-Hallpike Right Duration 0    Dix-Hallpike Right Symptoms No nystagmus      Dix-Hallpike Left   Dix-Hallpike Left Duration brief reports  of spinning sensation, no nystagmus noted    Dix-Hallpike Left Symptoms No nystagmus      Sidelying Right   Sidelying Right Duration 0    Sidelying Right Symptoms No nystagmus      Sidelying Left   Sidelying Left Duration No Nystagmus, reports mild sensation of spinning    Sidelying Left Symptoms No nystagmus      Horizontal Canal Right   Horizontal Canal Right Duration 0    Horizontal Canal Right Symptoms Normal      Horizontal Canal Left   Horizontal Canal Left Duration 0    Horizontal Canal Left Symptoms Normal      Positional Sensitivities   Sit to Supine Mild dizziness    Supine to Left Side Mild dizziness    Supine to Right Side No dizziness    Supine to Sitting No dizziness    Right Hallpike No dizziness    Up from Right Hallpike Lightheadedness    Up from Left Hallpike Lightheadedness    Nose to Right Knee No dizziness    Right Knee to Sitting No dizziness    Nose to Left Knee No dizziness    Left Knee to Sitting No dizziness    Head Turning x 5 Mild dizziness    Head Nodding x 5 Mild dizziness   reports spinning sensation   Pivot Right in Standing No dizziness    Pivot Left in Standing No dizziness    Rolling  Right No dizziness    Rolling Left No dizziness      Orthostatics   Orthostatics Comment will be assessed at next session                Objective measurements completed on examination: See above findings.     PT Education - 09/17/21 0933     Education Details Educated on [employer]; Potential for BPPV    Person(s) Educated Patient    Methods Explanation    Comprehension Verbalized understanding              PT Short Term Goals - 09/17/21 0940       PT SHORT TERM GOAL #1   Title Pt will undergo further balance assesment (DGI/FGA) and LTG to be set as applicable    Baseline TBA    Time 3    Period Weeks    Status New    Target Date 10/11/21               PT Long Term Goals - 09/17/21 0941       PT LONG TERM  GOAL #1   Title Pt will be independent with final vestibular/balance HEP (All LTGs Due: 11/15/21)    Baseline no HEP established    Time 6    Period Weeks    Status New    Target Date 11/15/21      PT LONG TERM GOAL #2   Title LTG to be set for FGA/DGI    Baseline TBA    Time 6    Period Weeks    Status New      PT LONG TERM GOAL #3   Title Pt will report no dizziness with sit <> supine and rolling to the L demonstrating improved tolerance for bed mobility    Baseline mild dizziness    Time 6    Period Weeks    Status New      PT LONG TERM GOAL #4   Title Pt will improve MSQ to </= 1/5 to demonstrate improved tolerance for activities    Baseline 1-2/5    Time 6    Period Weeks    Status New                    Plan - 09/17/21 1018     Clinical Impression Statement Patient is a 76 y.o. female referred to Neuro OPPT services for Vertigo. Patient's PMH includes the following: Anemia, Anxiety, Carpal Tunnel, Back Pain, CKD, DM Type 2, Goiter, HTN, HLD, CAD, Thyroid Disease, Pituitary Adenoma. Upon evaluation patient present with normal oculomotor exam, normal DVA, unable to assess HIT due to pt difficulty relaxing for passive ROM. With positional testing, patient consistently reporting short duration (2-3 seconds of spinning) with L Dix Hallpike and L Sidelying but PT unable to see any nystagmus. Patient also demonstrating some mild motion sentiviity noted with head turns/nods. Will benefit from further balance assesment to determine fall risk, as unsteadiness noted with ambulation. patient will benefit from skilled PT services to address impairments and improve activity tolerance.    Personal Factors and Comorbidities Comorbidity 3+    Comorbidities Anemia, Anxiety, Carpal Tunnel, Back Pain, CKD, DM Type 2, Goiter, HTN, HLD, CAD, Thyroid Disease, Pituitary Adenoma.    Examination-Activity Limitations Bed Mobility;Locomotion Level;Reach Overhead    Examination-Participation  Restrictions Volunteer;Cleaning;Community Activity    Stability/Clinical Decision Making Stable/Uncomplicated    Clinical Decision Making Low    Rehab Potential Good  PT Frequency 1x / week    PT Duration 6 weeks    PT Treatment/Interventions ADLs/Self Care Home Management;Canalith Repostioning;Moist Heat;Cryotherapy;DME Instruction;Stair training;Functional mobility training;Therapeutic activities;Balance training;Therapeutic exercise;Gait training;Neuromuscular re-education;Patient/family education;Manual techniques;Passive range of motion;Dry needling;Vestibular    PT Next Visit Plan Reassess for L BPPV, patient symptomatic but no nystagmus noted (may need to use frenzel goggles). Retry to Test HIT Bilat, FGA/DGI, and potential need to assess Orthostatics due to reports of lightheadedness.    Consulted and Agree with Plan of Care Patient             Patient will benefit from skilled therapeutic intervention in order to improve the following deficits and impairments:  Decreased balance, Abnormal gait, Difficulty walking, Decreased range of motion, Decreased activity tolerance, Dizziness  Visit Diagnosis: Dizziness and giddiness  Unsteadiness on feet     Problem List Patient Active Problem List   Diagnosis Date Noted   Fatigue 09/04/2021   Closed compression fracture of first lumbar vertebra (HCC) 04/09/2021   BPPV (benign paroxysmal positional vertigo), left 04/04/2021   Peripheral vertigo 01/15/2021   Pituitary mass ([place]) 12/31/2020   Left anterior knee pain 12/13/2020   Chronic kidney disease, stage 3a ([place]) 12/13/2020   Sciatic nerve pain, left 05/30/2020   Boil of buttock 04/19/2020   Body mass index (BMI) 40.0-44.9, adult ([place]) 01/26/2020   Mild coronary artery disease 10/2019   History of spinal fracture 03/24/2019   Age-related osteoporosis with current pathological fracture 03/24/2019   Nontraumatic complete tear of left rotator cuff 03/24/2019   Primary  hypercoagulable state ([place]) 03/07/2019   Osteoporotic compression fracture of spine ([place]) 03/07/2019   Pulmonary emboli ([place]) 03/03/2019   Chronic left hip pain 08/17/2018   Left sided numbness 07/22/2018   Onychogryphosis 05/31/2018   Tinea pedis of right foot 05/31/2018   Low back pain potentially associated with radiculopathy 01/29/2018   Diverticulosis of colon 03/24/2017   Hypertensive retinopathy of right eye 01/20/2017   Hypermetropia of both eyes 01/20/2017   Back pain 01/05/2017   Chronic kidney disease 04/29/2016   Normocytic anemia 04/29/2016   Dark stools 10/19/2015   Rotator cuff dysfunction 06/25/2015   Bruise 12/07/2014   Long term (current) use of anticoagulants 05/08/2011   Hyperlipidemia associated with type 2 diabetes mellitus ([place]) 08/08/2008   Type 2 diabetes mellitus with hemoglobin A1c goal of less than 7.5% ([place]) 06/05/2008   HYPERTENSION, BENIGN ESSENTIAL 11/27/2006   Morbid (severe) obesity due to excess calories ([place]) 10/08/2006   GASTROESOPHAGEAL REFLUX, NO ESOPHAGITIS 10/08/2006   Osteoarthritis, multiple sites 10/08/2006    Jones Bales, PT, DPT 09/17/2021, 10:27 AM  [facility] [address] [place], [place], [address] Phone: [phone]   Fax:  [phone]  Name: Erin Coffey MRN: [id number] Date of Birth: [birth year]

## 2021-09-23 ENCOUNTER — Encounter: Payer: Self-pay | Admitting: Podiatry

## 2021-09-23 ENCOUNTER — Ambulatory Visit: Payer: Medicare Other | Admitting: Podiatry

## 2021-09-23 ENCOUNTER — Other Ambulatory Visit: Payer: Self-pay

## 2021-09-23 DIAGNOSIS — L84 Corns and callosities: Secondary | ICD-10-CM | POA: Diagnosis not present

## 2021-09-23 DIAGNOSIS — M79674 Pain in right toe(s): Secondary | ICD-10-CM | POA: Diagnosis not present

## 2021-09-23 DIAGNOSIS — E119 Type 2 diabetes mellitus without complications: Secondary | ICD-10-CM | POA: Diagnosis not present

## 2021-09-23 DIAGNOSIS — M79675 Pain in left toe(s): Secondary | ICD-10-CM | POA: Diagnosis not present

## 2021-09-23 DIAGNOSIS — B351 Tinea unguium: Secondary | ICD-10-CM

## 2021-09-26 ENCOUNTER — Ambulatory Visit: Payer: Medicare Other | Admitting: Physical Therapy

## 2021-09-26 ENCOUNTER — Other Ambulatory Visit: Payer: Self-pay

## 2021-09-26 DIAGNOSIS — R2681 Unsteadiness on feet: Secondary | ICD-10-CM

## 2021-09-26 DIAGNOSIS — R42 Dizziness and giddiness: Secondary | ICD-10-CM

## 2021-09-26 NOTE — Therapy (Signed)
[facility] [address] [place], [place], [address] Phone: [phone]   Fax:  [phone]  Physical Therapy Treatment  Patient Details  Name: Erin Coffey MRN: [id number] Date of Birth: [birth year] Referring Provider (PT): Madison Hickman, MD   Encounter Date: 09/26/2021   PT End of Session - 09/26/21 1314     Visit Number 2    Number of Visits 7    Date for PT Re-Evaluation 11/01/21    Authorization Type [employer] Medicare    Progress Note Due on Visit 10    PT Start Time 1230    PT Stop Time 1309    PT Time Calculation (min) 39 min    Activity Tolerance Patient tolerated treatment well    Behavior During Therapy [facility] for tasks assessed/performed             Past Medical History:  Diagnosis Date   Allergy    Anemia, iron deficiency    ANXIETY, SITUATIONAL 04/06/2009   Qualifier: Diagnosis of  By: [name]  MD, Stephanie     Arthritis    Back pain, thoracic 12/20/2012   Carpal tunnel syndrome 12/07/2014   Cholelithiasis 06/25/2012   Chronic kidney disease    DM type 2 goal A1C below 7.5 06/05/2008   Qualifier: Diagnosis of  By: [name]  MD, [place], NO ESOPHAGITIS 10/08/2006   Qualifier: Diagnosis of  By: Griffin Dakin, unspecified 10/21/2007   Qualifier: Diagnosis of  By: [name] MD, [name]     History of DVT (deep vein thrombosis) 05/05/2013   on Eliquis   HYPERLIPIDEMIA 08/08/2008   Qualifier: Diagnosis of  By: [name]  MD, Stephanie     HYPERTENSION, BENIGN ESSENTIAL 11/27/2006   Qualifier: Diagnosis of  ByHoy Morn MD, [name]     Mild coronary artery disease 10/2019   Coronary CT Angiogram: Coronary calcium score 46.Normal coronary origin.  Left dominance with tortuous coronary arteries.  Nondominant RCA - prox < 25% followed by ectasia (dilated to 5.5 mm) mixed plaque in distal vessel.  LM- normal . LAD: minimal mixed plaque in ostial & proximal (<25%) with brief IM bridging in  mLAD. Mild mid-distal (25-49%), RI - prox <25%, Large LCx- minimal (<25%) mid-dista   Mild coronary artery disease    Personal history of colonic polyps 10/24/2009   Qualifier: Diagnosis of  By: [name] MD, Sandy Salaam    [place] 08/08/2009   Qualifier: Diagnosis of  By: [name], [name]     Polymyalgia rheumatica ([place]) 10/02/2009   Qualifier: Diagnosis of  By: [name] MD, Apolonio Schneiders     Pulmonary embolism [facility]) - On long Term anticoagulation (Z79.01) 04/28/2011   Converted from warfarin to Eliquis; on long-term anticoagulation.   Thyroid disease    Trigger ring finger of left hand 11/02/2014    Past Surgical History:  Procedure Laterality Date   ABDOMINAL HYSTERECTOMY     CARDIAC CATHETERIZATION  04/29/11   (Dr. Lia Foyer [facility]): LM - normal. LAD with 1 major Diag - normal, bifurcating Ramus - normal, Large (6-7 mm) Dominant LCx with 1 OM, 2 LPL branches & LPDA - non significant disease; small non-dominant RCA.     CARDIAC EVENT MONITOR  09/2019   Mostly sinus rhythm, average rate 60 bpm.  Range 45 to 190 bpm.  No critical events indicating any arrhythmias or pauses.  For patient responses noted PVCs.  Less than 1% PVCs noted.  CARPAL TUNNEL RELEASE Right 1996   COLONOSCOPY     KNEE ARTHROSCOPY  Bilateral   TRANSTHORACIC ECHOCARDIOGRAM  09/2019   Normal EF - 60 to 65%.  Mild LVH.  GR 1 DD.  Normal RV.  Trivial MR.  Normal IVC.  Normal PA pressures.   TUBAL LIGATION     UPPER GASTROINTESTINAL ENDOSCOPY      There were no vitals filed for this visit.   Subjective Assessment - 09/26/21 1237     Subjective Pt reports dizziness is getting better; feels like she would have benefit from therapy early on when dizziness started.  R knee is more painful today, antalgic gait.    Pertinent History Anemia, Anxiety, Carpal Tunnel, Back Pain, CKD, DM Type 2, Goiter, HTN, HLD, CAD, Thyroid Disease, Pituitary Adenoma    Diagnostic tests Pituitary adenoma seen on MR Brain in July 2022. Follows with Dr  Christella Noa. MRI on 09/14/21: Unchanged sellar mass, consistent with pituitary adenoma, with  suprasellar extension and unchanged chiasmatic displacement. No  evidence of invasion of the right cavernous sinus.    Patient Stated Goals Resolve the Vertigo.    Currently in Pain? Yes    Pain Score 2                 [facility] PT Assessment - 09/26/21 1302       Functional Gait  Assessment   Gait assessed  No   R knee arthritis flared up today; antalgic gait                Vestibular Assessment - 09/26/21 1239       Positional Testing   Dix-Hallpike Dix-Hallpike Left    Sidelying Test Sidelying Left      Dix-Hallpike Left   Dix-Hallpike Left Duration 5 seconds    Dix-Hallpike Left Symptoms Upbeat, left rotatory nystagmus      Sidelying Left   Sidelying Left Duration 5    Sidelying Left Symptoms Upbeat, left rotatory nystagmus      Positional Sensitivities   Head Turning x 5 No dizziness    Head Nodding x 5 No dizziness      Orthostatics   Orthostatics Comment No need to assess today due to improvement in symptoms after CRM                       Vestibular Treatment/Exercise - 09/26/21 1248       Vestibular Treatment/Exercise   Vestibular Treatment Provided Canalith Repositioning;Gaze    Canalith Repositioning Epley Manuever Left    Gaze Exercises X1 Viewing Horizontal;X1 Viewing Vertical       EPLEY MANUEVER LEFT   Number of Reps  2    Overall Response  Symptoms Resolved      X1 Viewing Horizontal   Foot Position seated    Reps 1    Comments 60 seconds, no symptoms      X1 Viewing Vertical   Foot Position seated    Reps 1    Comments 60 seconds, no symptoms                    PT Education - 09/26/21 1313     Education Details used canal model to explain pathophysiology of BPPV and why she only feels symptoms with changes in position.  Plan for next visit based on symptoms    Person(s) Educated Patient    Methods Explanation     Comprehension Verbalized understanding  PT Short Term Goals - 09/17/21 0940       PT SHORT TERM GOAL #1   Title Pt will undergo further balance assesment (DGI/FGA) and LTG to be set as applicable    Baseline TBA    Time 3    Period Weeks    Status New    Target Date 10/11/21               PT Long Term Goals - 09/17/21 0941       PT LONG TERM GOAL #1   Title Pt will be independent with final vestibular/balance HEP (All LTGs Due: 11/15/21)    Baseline no HEP established    Time 6    Period Weeks    Status New    Target Date 11/15/21      PT LONG TERM GOAL #2   Title LTG to be set for FGA/DGI    Baseline TBA    Time 6    Period Weeks    Status New      PT LONG TERM GOAL #3   Title Pt will report no dizziness with sit <> supine and rolling to the L demonstrating improved tolerance for bed mobility    Baseline mild dizziness    Time 6    Period Weeks    Status New      PT LONG TERM GOAL #4   Title Pt will improve MSQ to </= 1/5 to demonstrate improved tolerance for activities    Baseline 1-2/5    Time 6    Period Weeks    Status New                   Plan - 09/26/21 1315     Clinical Impression Statement Pt did present with vertigo and upbeating, L torsional nystamgus of short duration during L dix hallpike indicating + L posterior canal canalithiasis.  Symptoms resolved after 1 CRM.  Pt also reporting resolution of motion sensitivity to head turns and head nods.  No symptoms with VOR training.  Plan to reassess in two weeks and if symptoms have resolved pt will likely be appropriate to D/C.    Personal Factors and Comorbidities Comorbidity 3+    Comorbidities Anemia, Anxiety, Carpal Tunnel, Back Pain, CKD, DM Type 2, Goiter, HTN, HLD, CAD, Thyroid Disease, Pituitary Adenoma.    Examination-Activity Limitations Bed Mobility;Locomotion Level;Reach Overhead    Examination-Participation Restrictions Volunteer;Cleaning;Community Activity     Stability/Clinical Decision Making Stable/Uncomplicated    Rehab Potential Good    PT Frequency 1x / week    PT Duration 6 weeks    PT Treatment/Interventions ADLs/Self Care Home Management;Canalith Repostioning;Moist Heat;Cryotherapy;DME Instruction;Stair training;Functional mobility training;Therapeutic activities;Balance training;Therapeutic exercise;Gait training;Neuromuscular re-education;Patient/family education;Manual techniques;Passive range of motion;Dry needling;Vestibular    PT Next Visit Plan Pt did have L BPPV; have symptoms resolved?  If so, check goals and D/C.    Consulted and Agree with Plan of Care Patient             Patient will benefit from skilled therapeutic intervention in order to improve the following deficits and impairments:  Decreased balance, Abnormal gait, Difficulty walking, Decreased range of motion, Decreased activity tolerance, Dizziness  Visit Diagnosis: Dizziness and giddiness  Unsteadiness on feet     Problem List Patient Active Problem List   Diagnosis Date Noted   Fatigue 09/04/2021   Closed compression fracture of first lumbar vertebra (HCC) 04/09/2021   BPPV (benign paroxysmal positional vertigo), left 04/04/2021  Peripheral vertigo 01/15/2021   Pituitary mass ([place]) 12/31/2020   Left anterior knee pain 12/13/2020   Chronic kidney disease, stage 3a ([place]) 12/13/2020   Sciatic nerve pain, left 05/30/2020   Boil of buttock 04/19/2020   Body mass index (BMI) 40.0-44.9, adult ([place]) 01/26/2020   Mild coronary artery disease 10/2019   History of spinal fracture 03/24/2019   Age-related osteoporosis with current pathological fracture 03/24/2019   Nontraumatic complete tear of left rotator cuff 03/24/2019   Primary hypercoagulable state ([place]) 03/07/2019   Osteoporotic compression fracture of spine ([place]) 03/07/2019   Pulmonary emboli ([place]) 03/03/2019   Chronic left hip pain 08/17/2018   Left sided numbness 07/22/2018   Onychogryphosis  05/31/2018   Tinea pedis of right foot 05/31/2018   Low back pain potentially associated with radiculopathy 01/29/2018   Diverticulosis of colon 03/24/2017   Hypertensive retinopathy of right eye 01/20/2017   Hypermetropia of both eyes 01/20/2017   Back pain 01/05/2017   Chronic kidney disease 04/29/2016   Normocytic anemia 04/29/2016   Dark stools 10/19/2015   Rotator cuff dysfunction 06/25/2015   Bruise 12/07/2014   Long term (current) use of anticoagulants 05/08/2011   Hyperlipidemia associated with type 2 diabetes mellitus ([place]) 08/08/2008   Type 2 diabetes mellitus with hemoglobin A1c goal of less than 7.5% ([place]) 06/05/2008   HYPERTENSION, BENIGN ESSENTIAL 11/27/2006   Morbid (severe) obesity due to excess calories ([place]) 10/08/2006   GASTROESOPHAGEAL REFLUX, NO ESOPHAGITIS 10/08/2006   Osteoarthritis, multiple sites 10/08/2006   Rico Junker, PT, DPT 09/26/21    1:18 PM    [facility] [address] [place], [place], [address] Phone: [phone]   Fax:  [phone]  Name: Erin Coffey MRN: [id number] Date of Birth: 07-28-1946

## 2021-09-29 NOTE — Progress Notes (Signed)
°  Subjective:  Patient ID: Erin Coffey, female    DOB: [birth year],  MRN: [id number]  Erin Coffey presents to clinic today for preventative diabetic foot care and callus(es) bilaterally and painful thick toenails that are difficult to trim. Painful toenails interfere with ambulation. Aggravating factors include wearing enclosed shoe gear. Pain is relieved with periodic professional debridement. Painful calluses are aggravated when weightbearing with and without shoegear. Pain is relieved with periodic professional debridement.  Patient does not monitor blood glucose daily stating blood sugar is under control at 6.3%.  New problem(s): None. She did see Dr. Paulla Dolly for achilles tendonitis and was given instructions for conservative management. She has had improvement with heel lift in right shoe. If condition worsens, she will follow up with him.  PCP is [name], DO , and last visit was September 02, 2021.  Allergies  Allergen Reactions   Lisinopril Cough    Review of Systems: Negative except as noted in the HPI. Objective:   Constitutional Erin Coffey is a pleasant 76 y.o. African American female, morbidly obese in NAD. AAO x 3.   Vascular CFT <3 seconds b/l LE. Palpable DP/PT pulses b/l LE. Digital hair present b/l. Skin temperature gradient WNL b/l. No pain with calf compression b/l. Trace edema noted b/l. No cyanosis or clubbing noted b/l LE.  Neurologic Normal speech. Oriented to person, place, and time. Pt has subjective symptoms of neuropathy. Protective sensation intact 5/5 intact bilaterally with 10g monofilament b/l.  Dermatologic Pedal integument with normal turgor, texture and tone b/l LE. No open wounds b/l. No interdigital macerations b/l. Toenails 1-5 b/l elongated, thickened, discolored with subungual debris. +Tenderness with dorsal palpation of nailplates. Hyperkeratotic lesion(s) noted submet head 5 b/l.  Orthopedic: Normal muscle strength 5/5 to all  lower extremity muscle groups bilaterally. Pes planus deformity noted bilateral LE.[name] No pain, crepitus or joint limitation noted with ROM b/l LE.  Patient ambulates independently without assistive aids.   Radiographs: None  Last A1c:  Hemoglobin A1C Latest Ref Rng & Units 05/08/2021 02/05/2021 10/29/2020  HGBA1C 0.0 - 7.0 % 6.3 6.1 6.0  Some recent data might be hidden   Assessment:   1. Pain due to onychomycosis of toenails of both feet   2. Callus   3. Controlled type 2 diabetes mellitus without complication, without long-term current use of insulin ([place])    Plan:  Patient was evaluated and treated and all questions answered. Consent given for treatment as described below: -Continue foot and shoe inspections daily. Monitor blood glucose per PCP/Endocrinologist's recommendations. -Mycotic toenails 1-5 bilaterally were debrided in length and girth with sterile nail nippers and dremel without incident. -Callus(es) submet head 5 b/l pared utilizing sterile scalpel blade without complication or incident. Total number debrided =2. -Patient/POA to call should there be question/concern in the interim.  Return in about 3 months (around 12/21/2021).  Marzetta Board, DPM

## 2021-09-30 ENCOUNTER — Other Ambulatory Visit: Payer: Self-pay | Admitting: Family Medicine

## 2021-09-30 ENCOUNTER — Other Ambulatory Visit: Payer: Self-pay

## 2021-09-30 ENCOUNTER — Ambulatory Visit (INDEPENDENT_AMBULATORY_CARE_PROVIDER_SITE_OTHER): Payer: Medicare Other | Admitting: Family Medicine

## 2021-09-30 ENCOUNTER — Encounter: Payer: Self-pay | Admitting: Family Medicine

## 2021-09-30 VITALS — Ht 67.44 in | Wt 292.8 lb

## 2021-09-30 DIAGNOSIS — M545 Low back pain, unspecified: Secondary | ICD-10-CM

## 2021-09-30 DIAGNOSIS — Z0000 Encounter for general adult medical examination without abnormal findings: Secondary | ICD-10-CM

## 2021-09-30 DIAGNOSIS — G8929 Other chronic pain: Secondary | ICD-10-CM

## 2021-09-30 MED ORDER — TRAMADOL HCL 50 MG PO TABS: 50.0000 mg | ORAL_TABLET | Freq: Four times a day (QID) | ORAL | 0 refills | Status: DC | PRN

## 2021-09-30 NOTE — Patient Instructions (Addendum)
Today at your annual preventive visit we talked about the following goals: Be sure to let me know where you want to get your Shingles vaccine sent to.    Work on the goals listed below.    Goals      Activity and Exercise Increased     Evidence-based guidance:  Review current exercise levels.  Assess patient perspective on exercise or activity level, barriers to increasing activity, motivation and readiness for change.  Recommend or set healthy exercise goal based on individual tolerance.  Encourage small steps toward making change in amount of exercise or activity.  Urge reduction of sedentary activities or screen time.  Promote group activities within the community or with family or support person.  Consider referral to rehabiliation therapist for assessment and exercise/activity plan.   Notes:  Using your floor stationary bike three times a week.  If your knees are bothering your, perform rotation of your arms instead.      HEMOGLOBIN A1C < 7     Sept 2022 a1c 6.3      Weight Loss Achieved     Evidence-based guidance:  Review medication that may contribute to weight gain, such as corticosteroid, beta-blocker, tricyclic antidepressant, oral antihyperglycemic; advocate for changes when appropriate.  Perform or refer to registered dietitian to perform comprehensive nutrition assessment that includes disordered-eating behaviors, such as binge-eating, emotional or compulsive eating, grazing.  Counsel patient regarding health risks of obesity and that weight loss goal of 5 to 10 percent of initial weight will improve risk.  Recommend initial weight loss goal of 3 to 5 percent of bodyweight; increase weight-loss goals based on patient success as achieving greater weight loss continues to reduce risk.  Propose a calorie-reduced diet based on the patient's preferences and health status.  Provide ongoing emotional support or cognitive behavioral therapy and dietitian services (individual,  group, virtual) over at least 6 months with a minimum of 14 encounters to best facilitate weight loss.  Provide monthly follow-up for 12 months when weight loss goal is met to assist with maintenance of weight loss.  Encourage increased physical activity or exercise based on individual age, risk, and ability up to 200 to 300 minutes per week that includes aerobic and resistance training.  Encourage reduction in sedentary behaviors by replacing them with nonexercise yet active leisure pursuits.  Identify physical barriers, such as change in posture, balance, gait patterns, joint pain, and environmental barriers to activity.  Consider referral to rehabilitation therapy, especially when mobility or function is impaired due to osteoarthritis and obesity.  Consider referral to weight-loss program that has published evidence of safety and efficacy if on-site intensive intervention is unavailable or patient preference.  Prepare patient for use of pharmacologic therapy as an adjunct to lifestyle changes based on body mass index, patient agreement and presence of risk factors or comorbidities.  Evaluate efficacy of pharmacologic therapy (weight loss) and tolerance to medication periodically.  Engage in shared decision-making regarding referral to bariatric surgeon for consultation and evaluation when weight-loss goal has not been accomplished by behavioral therapy with or without pharmacologic therapy.   Notes:  Would like to lose 12-15 lbs to get back to 280 lbs

## 2021-09-30 NOTE — Progress Notes (Signed)
Tramadol refilled per patient request during AWV.   [name], DO

## 2021-09-30 NOTE — Progress Notes (Signed)
Subjective:   Erin Coffey is a 76 y.o. female who presents for Medicare Annual (Subsequent) preventive examination.  In person visit.   Review of Systems: chronic right knee pain  Cardiac Risk Factors include: advanced age (>11men, >79 women);diabetes mellitus;dyslipidemia;hypertension;obesity (BMI >30kg/m2)     Objective:     Vitals: Ht 5' 7.44" (1.713 m)    Wt 292 lb 12.8 oz (132.8 kg)    BMI 45.26 kg/m   Body mass index is 45.26 kg/m.  Advanced Directives 09/30/2021 09/17/2021 09/02/2021 07/26/2021 05/08/2021 05/02/2021 02/05/2021  Does Patient Have a Medical Advance Directive? (No Data) No No No No No No  Type of Advance Directive - - - - - - -  Copy of Healthcare Power of Attorney in Chart? - - - - - - -  Would patient like information on creating a medical advance directive? No - Patient declined No - Patient declined No - Patient declined No - Patient declined No - Patient declined - No - Patient declined  Patient has discussed with family her wishes. She has a unsigned advanced medical directive.   Tobacco Social History   Tobacco Use  Smoking Status Never  Smokeless Tobacco Never     Counseling given: Not Answered   Clinical Intake:     Pain : 0-10 Pain Score: 5  Pain Type: Chronic pain Pain Location: Knee Pain Orientation: Right Pain Descriptors / Indicators: Aching Pain Onset: More than a month ago Pain Frequency: Intermittent (walking makes it worse)     BMI - recorded: 45.26 Nutritional Status: BMI > 30  Obese  How often do you need to have someone help you when you read instructions, pamphlets, or other written materials from your doctor or pharmacy?: 1 - Never What is the last grade level you completed in school?: 10 th  Interpreter Needed?: No  Information entered by :: [name], DO  Past Medical History:  Diagnosis Date   Allergy    Anemia, iron deficiency    ANXIETY, SITUATIONAL 04/06/2009   Qualifier: Diagnosis of   By: [name]  MD, Stephanie     Arthritis    Back pain, thoracic 12/20/2012   Carpal tunnel syndrome 12/07/2014   Cholelithiasis 06/25/2012   Chronic kidney disease    DM type 2 goal A1C below 7.5 06/05/2008   Qualifier: Diagnosis of  By: [name]  MD, Stephanie     GASTROESOPHAGEAL REFLUX, NO ESOPHAGITIS 10/08/2006   Qualifier: Diagnosis of  By: Griffin Dakin, unspecified 10/21/2007   Qualifier: Diagnosis of  By: [name] MD, [name]     History of DVT (deep vein thrombosis) 05/05/2013   on Eliquis   HYPERLIPIDEMIA 08/08/2008   Qualifier: Diagnosis of  By: [name]  MD, Stephanie     HYPERTENSION, BENIGN ESSENTIAL 11/27/2006   Qualifier: Diagnosis of  ByHoy Morn MD, [name]     Mild coronary artery disease 10/2019   Coronary CT Angiogram: Coronary calcium score 46.Normal coronary origin.  Left dominance with tortuous coronary arteries.  Nondominant RCA - prox < 25% followed by ectasia (dilated to 5.5 mm) mixed plaque in distal vessel.  LM- normal . LAD: minimal mixed plaque in ostial & proximal (<25%) with brief IM bridging in mLAD. Mild mid-distal (25-49%), RI - prox <25%, Large LCx- minimal (<25%) mid-dista   Mild coronary artery disease    Personal history of colonic polyps 10/24/2009   Qualifier: Diagnosis of  By: [name] MD, Sandy Salaam  [place] 08/08/2009   Qualifier: Diagnosis of  By: [name], [name]     Pituitary adenoma [facility])    Polymyalgia rheumatica ([place]) 10/02/2009   Qualifier: Diagnosis of  By: [name] MD, Apolonio Schneiders     Pulmonary embolism [facility]) - On long Term anticoagulation (Z79.01) 04/28/2011   Converted from warfarin to Eliquis; on long-term anticoagulation.   Thyroid disease    Trigger ring finger of left hand 11/02/2014   Past Surgical History:  Procedure Laterality Date   ABDOMINAL HYSTERECTOMY     CARDIAC CATHETERIZATION  04/29/11   (Dr. Lia Foyer [facility]): LM - normal. LAD with 1 major Diag - normal, bifurcating Ramus - normal, Large (6-7 mm) Dominant LCx with 1 OM, 2 LPL  branches & LPDA - non significant disease; small non-dominant RCA.     CARDIAC EVENT MONITOR  09/2019   Mostly sinus rhythm, average rate 60 bpm.  Range 45 to 190 bpm.  No critical events indicating any arrhythmias or pauses.  For patient responses noted PVCs.  Less than 1% PVCs noted.    CARPAL TUNNEL RELEASE Right 1996   COLONOSCOPY     KNEE ARTHROSCOPY  Bilateral   TRANSTHORACIC ECHOCARDIOGRAM  09/2019   Normal EF - 60 to 65%.  Mild LVH.  GR 1 DD.  Normal RV.  Trivial MR.  Normal IVC.  Normal PA pressures.   TUBAL LIGATION     UPPER GASTROINTESTINAL ENDOSCOPY     Family History  Problem Relation Age of Onset   Diabetes Mother    Heart disease Mother    Hypertension Mother    Heart disease Father    Hypertension Father    Diabetes Sister    Heart disease Sister    Hypertension Brother    Cancer Sister 19       lukemia   Cirrhosis Sister        alcohol   Kidney disease Sister    Diabetes Sister    Diabetes Brother    Colon cancer Neg Hx    Pancreatic cancer Neg Hx    Esophageal cancer Neg Hx    Social History   Socioeconomic History   Marital status: Widowed    Spouse name: Not on file   Number of children: 3   Years of education: 10   Highest education level: 10th grade  Occupational History   Occupation: Retired- Airline pilot: [employer]  Tobacco Use   Smoking status: Never   Smokeless tobacco: Never  Vaping Use   Vaping Use: Never used  Substance and Sexual Activity   Alcohol use: No    Alcohol/week: 0.0 standard drinks   Drug use: No   Sexual activity: Not Currently  Other Topics Concern   Not on file  Social History Narrative   Patient lives alone in [place], she is a widow.   Patient has 3 children. Two daughters live locally, one son in [place].    Patient is very active in her church and volunteer work to stay busy.    Patient enjoys cooking, spending time with family, and crossword puzzles.       Social Determinants of Health    Financial Resource Strain: Not on file  Food Insecurity: Not on file  Transportation Needs: Not on file  Physical Activity: Not on file  Stress: Not on file  Social Connections: Not on file    Outpatient Encounter Medications as of 09/30/2021  Medication Sig   acetaminophen (TYLENOL) 650 MG CR tablet  Take 1,300 mg by mouth every 8 (eight) hours as needed for pain.   alendronate (FOSAMAX) 70 MG tablet TAKE 1 TABLET BY MOUTH  EVERY 7 DAYS WITH A FULL  GLASS OF WATER ON AN EMPTY  STOMACH   amLODipine (NORVASC) 5 MG tablet TAKE 1 TABLET BY MOUTH AT  BEDTIME   atorvastatin (LIPITOR) 40 MG tablet TAKE 1 TABLET BY MOUTH  DAILY   baclofen (LIORESAL) 10 MG tablet Take 1 tablet (10 mg total) by mouth daily as needed for muscle spasms.   Blood Glucose Monitoring Suppl (ONETOUCH VERIO) w/Device KIT Use as instructed to test sugars once daily.  Dx Code: E11.9   CHOLECALCIFEROL PO Take 2,200 Units by mouth daily.   ELIQUIS 5 MG TABS tablet TAKE 1 TABLET BY MOUTH  TWICE DAILY   fish oil-omega-3 fatty acids 1000 MG capsule Take 1 g by mouth daily.   gabapentin (NEURONTIN) 100 MG capsule TAKE 1 CAPSULE BY MOUTH  TWICE DAILY   glucose blood (ONETOUCH VERIO) test strip Use as instructed to test sugars once daily.  Dx Code: E11.9   iron polysaccharides (NIFEREX) 150 MG capsule    ketoconazole (NIZORAL) 2 % cream Apply to both feet once daily for 4 weeks.   Lancet Devices (ONE TOUCH DELICA LANCING DEV) MISC Use as instructed to test sugars once daily.  Dx Code: E11.9   losartan (COZAAR) 100 MG tablet Take 1 tablet (100 mg total) by mouth daily.   metoprolol tartrate (LOPRESSOR) 25 MG tablet TAKE 1 TABLET BY MOUTH  TWICE DAILY   omeprazole (PRILOSEC) 20 MG capsule Take 20 mg by mouth daily.   omeprazole (PRILOSEC) 20 MG capsule Take 1 capsule (20 mg total) by mouth daily.   OneTouch Delica Lancets 19E MISC Use as instructed to test sugars once daily.  Dx Code: E11.9   polyethylene glycol powder  (GLYCOLAX/MIRALAX) powder Take 17 g by mouth 2 (two) times daily as needed. (Patient taking differently: Take 17 g by mouth daily as needed (constipation).)   vitamin C (ASCORBIC ACID) 500 MG tablet Take 500 mg by mouth daily.   [DISCONTINUED] traMADol (ULTRAM) 50 MG tablet TAKE 1 TABLET BY MOUTH EVERY 6 HOURS AS NEEDED   [DISCONTINUED] atorvastatin (LIPITOR) 40 MG tablet Take 1 tablet by mouth daily.   [DISCONTINUED] benzonatate (TESSALON) 200 MG capsule Take 1 capsule (200 mg total) by mouth 2 (two) times daily as needed for cough.   [DISCONTINUED] cetirizine (ZYRTEC) 10 MG tablet Take 1 tablet (10 mg total) by mouth daily. (Patient not taking: Reported on 09/30/2021)   [DISCONTINUED] ELDERBERRY PO Take 2 g by mouth daily at 12 noon.   [DISCONTINUED] gabapentin (NEURONTIN) 100 MG capsule Take 1 capsule by mouth 2 (two) times daily.   [DISCONTINUED] meclizine (ANTIVERT) 12.5 MG tablet Take 1 tablet (12.5 mg total) by mouth 3 (three) times daily as needed for dizziness.   [DISCONTINUED] mupirocin cream (BACTROBAN) 2 % Apply 1 application topically 2 (two) times daily.   [DISCONTINUED] omeprazole (PRILOSEC OTC) 20 MG tablet    [DISCONTINUED] predniSONE (DELTASONE) 5 MG tablet TAKE AS DIRECTED BY MOUTH FOR 12 DAYS   [DISCONTINUED] predniSONE (STERAPRED UNI-PAK 48 TAB) 5 MG (48) TBPK tablet Take by mouth as directed.   [DISCONTINUED] triamcinolone (KENALOG) 0.1 % Apply 1 application topically daily as needed. Apply to rash   [DISCONTINUED] valsartan (DIOVAN) 320 MG tablet  (Patient not taking: Reported on 09/30/2021)   Facility-Administered Encounter Medications as of 09/30/2021  Medication   0.9 %  sodium chloride infusion    Activities of Daily Living In your present state of health, do you have any difficulty performing the following activities: 09/30/2021  Hearing? N  Vision? N  Difficulty concentrating or making decisions? N  Walking or climbing stairs? Y  Dressing or bathing? N  Doing  errands, shopping? Y  Preparing Food and eating ? N  Using the Toilet? N  In the past six months, have you accidently leaked urine? N  Do you have problems with loss of bowel control? N  Managing your Medications? N  Managing your Finances? N  Housekeeping or managing your Housekeeping? N  Some recent data might be hidden    Patient Care Team: [name], DO as PCP - General (Family Medicine) Marica Otter, [place] (Optometry) Ashok Pall, MD as Consulting Physician (Neurosurgery) Rosita Fire, MD as Consulting Physician (Nephrology) Leonie Man, MD as Consulting Physician (Cardiology)    Assessment:   This is a routine wellness examination for Tarshia.  Exercise Activities and Dietary recommendations Current Exercise Habits: The patient does not participate in regular exercise at present;Home exercise routine, Type of exercise: walking, Time (Minutes): > 60, Frequency (Times/Week): 7, Weekly Exercise (Minutes/Week): 0, Intensity: Mild, Exercise limited by: orthopedic condition(s)   Goals      Activity and Exercise Increased     Evidence-based guidance:  Review current exercise levels.  Assess patient perspective on exercise or activity level, barriers to increasing activity, motivation and readiness for change.  Recommend or set healthy exercise goal based on individual tolerance.  Encourage small steps toward making change in amount of exercise or activity.  Urge reduction of sedentary activities or screen time.  Promote group activities within the community or with family or support person.  Consider referral to rehabiliation therapist for assessment and exercise/activity plan.   Notes:  Using your floor stationary bike three times a week.  If your knees are bothering your, perform rotation of your arms instead.      HEMOGLOBIN A1C < 7     Sept 2022 a1c 6.3      Weight Loss Achieved     Evidence-based guidance:  Review medication that may contribute to  weight gain, such as corticosteroid, beta-blocker, tricyclic antidepressant, oral antihyperglycemic; advocate for changes when appropriate.  Perform or refer to registered dietitian to perform comprehensive nutrition assessment that includes disordered-eating behaviors, such as binge-eating, emotional or compulsive eating, grazing.  Counsel patient regarding health risks of obesity and that weight loss goal of 5 to 10 percent of initial weight will improve risk.  Recommend initial weight loss goal of 3 to 5 percent of bodyweight; increase weight-loss goals based on patient success as achieving greater weight loss continues to reduce risk.  Propose a calorie-reduced diet based on the patient's preferences and health status.  Provide ongoing emotional support or cognitive behavioral therapy and dietitian services (individual, group, virtual) over at least 6 months with a minimum of 14 encounters to best facilitate weight loss.  Provide monthly follow-up for 12 months when weight loss goal is met to assist with maintenance of weight loss.  Encourage increased physical activity or exercise based on individual age, risk, and ability up to 200 to 300 minutes per week that includes aerobic and resistance training.  Encourage reduction in sedentary behaviors by replacing them with nonexercise yet active leisure pursuits.  Identify physical barriers, such as change in posture, balance, gait patterns, joint pain, and environmental barriers to activity.  Consider referral to rehabilitation  therapy, especially when mobility or function is impaired due to osteoarthritis and obesity.  Consider referral to weight-loss program that has published evidence of safety and efficacy if on-site intensive intervention is unavailable or patient preference.  Prepare patient for use of pharmacologic therapy as an adjunct to lifestyle changes based on body mass index, patient agreement and presence of risk factors or comorbidities.   Evaluate efficacy of pharmacologic therapy (weight loss) and tolerance to medication periodically.  Engage in shared decision-making regarding referral to bariatric surgeon for consultation and evaluation when weight-loss goal has not been accomplished by behavioral therapy with or without pharmacologic therapy.   Notes:  Would like to lose 12-15 lbs to get back to 280 lbs         Fall Risk Fall Risk  09/30/2021 07/26/2021 05/08/2021 02/05/2021 12/28/2020  Falls in the past year? $RemoveBe'1 1 1 1 1  'UUWMRgsWc$ Number falls in past yr: $Remove'1 1 1 1 1  'nARpwbF$ Comment 3 times - - - -  Injury with Fall? $RemoveBe'1 1 1 1 1  'QUfQVtifu$ Comment left knee, and left shoulder - - - -  Risk Factor Category  - - - - -  Risk for fall due to : History of fall(s);Impaired balance/gait;Orthopedic patient - - - -  Follow up Education provided;Falls prevention discussed - - - -   Is the patient's home free of loose throw rugs in walkways, pet beds, electrical cords, etc?   no      Grab bars in the bathroom? no      Handrails on the stairs?   yes      Adequate lighting?   yes  Patient rating of health (0-10) scale: 8   Depression Screen PHQ 2/9 Scores 09/30/2021 09/02/2021 07/30/2021 07/26/2021  PHQ - 2 Score 0 $Remov'2 1 2  'FaTInz$ PHQ- 9 Score $Remov'3 4 6 4     'NvvYgV$ Cognitive Function MMSE - Mini Mental State Exam 10/27/2013  Orientation to time 5  Orientation to Place 5  Registration 3  Attention/ Calculation 5  Recall 3  Language- name 2 objects 2  Language- repeat 1  Language- follow 3 step command 3  Language- read & follow direction 1  Write a sentence 1  Copy design 1  Total score 30     6CIT Screen 09/30/2021 01/05/2020  What Year? 0 points 0 points  What month? 0 points 0 points  What time? 0 points 0 points  Count back from 20 0 points 0 points  Months in reverse 0 points 0 points  Repeat phrase 0 points 0 points  Total Score 0 0    Immunization History  Administered Date(s) Administered   Fluad Quad(high Dose 65+) 04/19/2020, 05/08/2021    Influenza Split 05/15/2011, 04/29/2012   Influenza Whole 06/05/2008, 08/08/2009   Influenza,inj,Quad PF,6+ Mos 05/05/2013, 06/08/2014, 04/19/2015, 04/29/2016, 08/21/2016, 04/09/2017, 05/31/2018, 05/05/2019   PFIZER(Purple Top)SARS-COV-2 Vaccination 10/01/2019, 10/25/2019, 08/18/2020   Pfizer Covid-19 Vaccine Bivalent Booster 68yrs & up 09/02/2021   Pneumococcal Conjugate-13 05/05/2013   Pneumococcal Polysaccharide-23 08/24/2014   Td 02/08/2002   Tdap 01/20/2013   Zoster Recombinat (Shingrix) 12/20/2012   Zoster, Live 12/20/2012     Screening Tests Health Maintenance  Topic Date Due   Zoster Vaccines- Shingrix (2 of 2) 02/14/2013   FOOT EXAM  10/16/2021   HEMOGLOBIN A1C  11/05/2021   OPHTHALMOLOGY EXAM  03/18/2022   MAMMOGRAM  01/18/2023   TETANUS/TDAP  01/21/2023   Pneumonia Vaccine 66+ Years old  Completed   INFLUENZA VACCINE  Completed   DEXA SCAN  Completed   COVID-19 Vaccine  Completed   Hepatitis C Screening  Completed   HPV VACCINES  Aged Out   COLONOSCOPY (Pts 45-29yrs Insurance coverage will need to be confirmed)  Discontinued    Cancer Screenings: Lung: Low Dose CT Chest recommended if Age 88-80 years, 30 pack-year currently smoking OR have quit w/in 15years. Patient does not qualify. Breast:  Up to date on Mammogram? Yes   Up to date of Bone Density/Dexa? Yes Colorectal: UTD   Additional Screenings: : Hepatitis C Screening:      Plan:     T2DM: A1C 6.3 in Sept 2022 is at goal.  Follow up for a1c and diabetes management.  I have personally reviewed and noted the following in the patients chart:   Medical and social history Use of alcohol, tobacco or illicit drugs  Current medications and supplements Functional ability and status Nutritional status Physical activity Advanced directives List of other physicians Hospitalizations, surgeries, and ER visits in previous 12 months Vitals Screenings to include cognitive, depression, and falls Referrals and  appointments  In addition, I have reviewed and discussed with patient certain preventive protocols, quality metrics, and best practice recommendations. A written personalized care plan for preventive services as well as general preventive health recommendations were provided to patient.    This visit was conducted in-office in the setting of the [place] pandemic.    [name], DO  09/30/2021

## 2021-10-01 DIAGNOSIS — N1832 Chronic kidney disease, stage 3b: Secondary | ICD-10-CM | POA: Diagnosis not present

## 2021-10-01 DIAGNOSIS — N189 Chronic kidney disease, unspecified: Secondary | ICD-10-CM | POA: Diagnosis not present

## 2021-10-07 DIAGNOSIS — I129 Hypertensive chronic kidney disease with stage 1 through stage 4 chronic kidney disease, or unspecified chronic kidney disease: Secondary | ICD-10-CM | POA: Diagnosis not present

## 2021-10-07 DIAGNOSIS — D631 Anemia in chronic kidney disease: Secondary | ICD-10-CM | POA: Diagnosis not present

## 2021-10-07 DIAGNOSIS — R809 Proteinuria, unspecified: Secondary | ICD-10-CM | POA: Diagnosis not present

## 2021-10-07 DIAGNOSIS — N2581 Secondary hyperparathyroidism of renal origin: Secondary | ICD-10-CM | POA: Diagnosis not present

## 2021-10-07 DIAGNOSIS — N1832 Chronic kidney disease, stage 3b: Secondary | ICD-10-CM | POA: Diagnosis not present

## 2021-10-08 ENCOUNTER — Other Ambulatory Visit: Payer: Self-pay | Admitting: Neurosurgery

## 2021-10-08 DIAGNOSIS — M546 Pain in thoracic spine: Secondary | ICD-10-CM

## 2021-10-08 DIAGNOSIS — D497 Neoplasm of unspecified behavior of endocrine glands and other parts of nervous system: Secondary | ICD-10-CM

## 2021-10-10 ENCOUNTER — Other Ambulatory Visit: Payer: Self-pay

## 2021-10-10 ENCOUNTER — Ambulatory Visit: Payer: Medicare Other | Attending: Family Medicine | Admitting: Physical Therapy

## 2021-10-10 DIAGNOSIS — R42 Dizziness and giddiness: Secondary | ICD-10-CM | POA: Diagnosis not present

## 2021-10-10 NOTE — Therapy (Signed)
OUTPATIENT PHYSICAL THERAPY TREATMENT NOTE   Patient Name: Erin Coffey MRN: [id number] DOB:[birth year], 76 y.o., female 3 Date: 10/10/2021  PCP: [name], DO REFERRING PROVIDER: Zenia Resides, MD   PT End of Session - 10/10/21 1645     Visit Number 3    Number of Visits 7    Date for PT Re-Evaluation 11/01/21    Authorization Type [employer] Medicare    Progress Note Due on Visit 10    PT Start Time 0803    PT Stop Time 0827    PT Time Calculation (min) 24 min    Activity Tolerance Patient tolerated treatment well    Behavior During Therapy [facility] for tasks assessed/performed             Past Medical History:  Diagnosis Date   Allergy    Anemia, iron deficiency    ANXIETY, SITUATIONAL 04/06/2009   Qualifier: Diagnosis of  By: [name]  MD, Stephanie     Arthritis    Back pain, thoracic 12/20/2012   Carpal tunnel syndrome 12/07/2014   Cholelithiasis 06/25/2012   Chronic kidney disease    DM type 2 goal A1C below 7.5 06/05/2008   Qualifier: Diagnosis of  By: [name]  MD, Stephanie     GASTROESOPHAGEAL REFLUX, NO ESOPHAGITIS 10/08/2006   Qualifier: Diagnosis of  By: Griffin Dakin, unspecified 10/21/2007   Qualifier: Diagnosis of  By: [name] MD, [name]     History of DVT (deep vein thrombosis) 05/05/2013   on Eliquis   HYPERLIPIDEMIA 08/08/2008   Qualifier: Diagnosis of  By: [name]  MD, Stephanie     HYPERTENSION, BENIGN ESSENTIAL 11/27/2006   Qualifier: Diagnosis of  ByHoy Morn MD, [name]     Mild coronary artery disease 10/2019   Coronary CT Angiogram: Coronary calcium score 46.Normal coronary origin.  Left dominance with tortuous coronary arteries.  Nondominant RCA - prox < 25% followed by ectasia (dilated to 5.5 mm) mixed plaque in distal vessel.  LM- normal . LAD: minimal mixed plaque in ostial & proximal (<25%) with brief IM bridging in mLAD. Mild mid-distal (25-49%), RI - prox <25%, Large LCx- minimal (<25%) mid-dista   Mild coronary artery disease     Personal history of colonic polyps 10/24/2009   Qualifier: Diagnosis of  By: [name] MD, Sandy Salaam    [place] 08/08/2009   Qualifier: Diagnosis of  By: [name], [name]     Pituitary adenoma [facility])    Polymyalgia rheumatica ([place]) 10/02/2009   Qualifier: Diagnosis of  By: [name] MD, Apolonio Schneiders     Pulmonary embolism [facility]) - On long Term anticoagulation (Z79.01) 04/28/2011   Converted from warfarin to Eliquis; on long-term anticoagulation.   Thyroid disease    Trigger ring finger of left hand 11/02/2014   Past Surgical History:  Procedure Laterality Date   ABDOMINAL HYSTERECTOMY     CARDIAC CATHETERIZATION  04/29/11   (Dr. Lia Foyer [facility]): LM - normal. LAD with 1 major Diag - normal, bifurcating Ramus - normal, Large (6-7 mm) Dominant LCx with 1 OM, 2 LPL branches & LPDA - non significant disease; small non-dominant RCA.     CARDIAC EVENT MONITOR  09/2019   Mostly sinus rhythm, average rate 60 bpm.  Range 45 to 190 bpm.  No critical events indicating any arrhythmias or pauses.  For patient responses noted PVCs.  Less than 1% PVCs noted.    CARPAL TUNNEL RELEASE Right 1996   COLONOSCOPY     KNEE  ARTHROSCOPY  Bilateral   TRANSTHORACIC ECHOCARDIOGRAM  09/2019   Normal EF - 60 to 65%.  Mild LVH.  GR 1 DD.  Normal RV.  Trivial MR.  Normal IVC.  Normal PA pressures.   TUBAL LIGATION     UPPER GASTROINTESTINAL ENDOSCOPY     Patient Active Problem List   Diagnosis Date Noted   Fatigue 09/04/2021   Closed compression fracture of first lumbar vertebra ([place]) 04/09/2021   BPPV (benign paroxysmal positional vertigo), left 04/04/2021   Peripheral vertigo 01/15/2021   Pituitary mass ([place]) 12/31/2020   Left anterior knee pain 12/13/2020   Chronic kidney disease, stage 3a ([place]) 12/13/2020   Sciatic nerve pain, left 05/30/2020   Boil of buttock 04/19/2020   Body mass index (BMI) 40.0-44.9, adult ([place]) 01/26/2020   Mild coronary artery disease 10/2019   History of spinal fracture 03/24/2019    Age-related osteoporosis with current pathological fracture 03/24/2019   Nontraumatic complete tear of left rotator cuff 03/24/2019   Primary hypercoagulable state ([place]) 03/07/2019   Osteoporotic compression fracture of spine ([place]) 03/07/2019   Pulmonary emboli ([place]) 03/03/2019   Chronic left hip pain 08/17/2018   Left sided numbness 07/22/2018   Onychogryphosis 05/31/2018   Tinea pedis of right foot 05/31/2018   Low back pain potentially associated with radiculopathy 01/29/2018   Diverticulosis of colon 03/24/2017   Hypertensive retinopathy of right eye 01/20/2017   Hypermetropia of both eyes 01/20/2017   Back pain 01/05/2017   Chronic kidney disease 04/29/2016   Normocytic anemia 04/29/2016   Dark stools 10/19/2015   Rotator cuff dysfunction 06/25/2015   Bruise 12/07/2014   Long term (current) use of anticoagulants 05/08/2011   Hyperlipidemia associated with type 2 diabetes mellitus ([place]) 08/08/2008   Type 2 diabetes mellitus with hemoglobin A1c goal of less than 7.5% ([place]) 06/05/2008   HYPERTENSION, BENIGN ESSENTIAL 11/27/2006   Morbid (severe) obesity due to excess calories ([employer]) 10/08/2006   GASTROESOPHAGEAL REFLUX, NO ESOPHAGITIS 10/08/2006   Osteoarthritis, multiple sites 10/08/2006    REFERRING DIAG: BPPV  THERAPY DIAG: vertigo  PERTINENT HISTORY: anemia, Anxiety, Carpal Tunnel, Back Pain, CKD, DM Type 2, Goiter, HTN, HLD, CAD, Thyroid Disease, Pituitary Adenoma    PRECAUTIONS: None  SUBJECTIVE: Pt reports vertigo is completely resolved  PAIN:  Are you having pain? Yes NPRS scale:  N/A/10 Pain location: generalized Pain orientation: Other: generalized PAIN TYPE: stiffness Pain description:  stiffness  and soreness Aggravating factors: damp weather Relieving factors: no specific    TODAY'S TREATMENT:   Lt Dix- Hallpike test negative for nystagmus and c/o vertigo  Pt performed bed mobility - sit to Lt sidelying- rolling onto Rt side with no c/o vertigo;  rolled from supine onto Lt side with no c/o vertigo  Reviewed LTG's with pt and instructed in Epley for self treatment prn  PATIENT EDUCATION: Education details: Instructed in Printmaker and Brandt-Daroff exercises Person educated: Patient Education method: Explanation, Demonstration, and Handouts Education comprehension: verbalized understanding   HOME EXERCISE PROGRAM: N/A at this time - pt was instructed in Epley maneuver and Brandt-Daroff exercises to have for self treatment prn   PT Short Term Goals - 09/17/21 0940       PT SHORT TERM GOAL #1   Title Pt will undergo further balance assesment (DGI/FGA) and LTG to be set as applicable    Baseline TBA    Time 3    Period Weeks    Status Met - further balance testing N/A since vertigo resolved  Target Date 10/11/21              PT Long Term Goals - 09/17/21 0941       PT LONG TERM GOAL #1   Title Pt will be independent with final vestibular/balance HEP (All LTGs Due: 11/15/21)    Baseline no HEP established    Time 6    Period Weeks    Status N/A - HEP not needed since vertigo resolved   Target Date 11/15/21      PT LONG TERM GOAL #2   Title LTG to be set for FGA/DGI    Baseline TBA    Time 6    Period Weeks    Status N/A     PT LONG TERM GOAL #3   Title Pt will report no dizziness with sit <> supine and rolling to the L demonstrating improved tolerance for bed mobility    Baseline mild dizziness    Time 6    Period Weeks    Status Met 10-10-21     PT LONG TERM GOAL #4   Title Pt will improve MSQ to </= 1/5 to demonstrate improved tolerance for activities    Baseline 1-2/5    Time 6    Period Weeks    Status Met 10-10-21              Plan - 10/10/21 1645     Clinical Impression Statement Pt has (-) Lt Dix-Hallpike test with no nystagmus and no c/o vertigo, indicative of resolution of Lt BPPV.  Pt reports no balance deficits at this time - therefore, further balance testing not needed.  Pt is  discharged due to goals met and BPPV resolved.    Personal Factors and Comorbidities Comorbidity 3+    Comorbidities Anemia, Anxiety, Carpal Tunnel, Back Pain, CKD, DM Type 2, Goiter, HTN, HLD, CAD, Thyroid Disease, Pituitary Adenoma.    Examination-Activity Limitations Bed Mobility;Locomotion Level;Reach Overhead    Examination-Participation Restrictions Volunteer;Cleaning;Community Activity    Stability/Clinical Decision Making Stable/Uncomplicated    Rehab Potential Good    PT Frequency 1x / week    PT Duration 6 weeks    PT Treatment/Interventions ADLs/Self Care Home Management;Canalith Repostioning;Moist Heat;Cryotherapy;DME Instruction;Stair training;Functional mobility training;Therapeutic activities;Balance training;Therapeutic exercise;Gait training;Neuromuscular re-education;Patient/family education;Manual techniques;Passive range of motion;Dry needling;Vestibular    Consulted and Agree with Plan of Care Patient              PHYSICAL THERAPY DISCHARGE SUMMARY  Visits from Start of Care: 3  Current functional level related to goals / functional outcomes: See above for progress towards LTG's   Remaining deficits: None regarding vertigo   Education / Equipment: Pt has been instructed in Brandt-Daroff & Epley maneuver for self treatment prn.   Patient agrees to discharge. Patient goals were met. Patient is being discharged due to meeting the stated rehab goals.    Alda Lea, PT 10/10/2021, 4:52 PM

## 2021-10-10 NOTE — Patient Instructions (Signed)
Benign Positional Vertigo Vertigo is the feeling that you or your surroundings are moving when they are not. Benign positional vertigo is the most common form of vertigo. This is usually a harmless condition (benign). This condition is positional. This means that symptoms are triggered by certain movements and positions. This condition can be dangerous if it occurs while you are doing something that could cause harm to yourself or others. This includes activities such as driving or operating machinery. What are the causes? The inner ear has fluid-filled canals that help your brain sense movement and balance. When the fluid moves, the brain receives messages about your body's position. With benign positional vertigo, calcium crystals in the inner ear break free and disturb the inner ear area. This causes your brain to receive confusing messages about your body's position. What increases the risk? You are more likely to develop this condition if: You are a woman. You are 74 years of age or older. You have recently had a head injury. You have an inner ear disease. What are the signs or symptoms? Symptoms of this condition usually happen when you move your head or your eyes in different directions. Symptoms may start suddenly and usually last for less than a minute. They include: Loss of balance and falling. Feeling like you are spinning or moving. Feeling like your surroundings are spinning or moving. Nausea and vomiting. Blurred vision. Dizziness. Involuntary eye movement (nystagmus). Symptoms can be mild and cause only minor problems, or they can be severe and interfere with daily life. Episodes of benign positional vertigo may return (recur) over time. Symptoms may also improve over time. How is this diagnosed? This condition may be diagnosed based on: Your medical history. A physical exam of the head, neck, and ears. Positional tests to check for or stimulate vertigo. You may be asked to  turn your head and change positions, such as going from sitting to lying down. A health care provider will watch for symptoms of vertigo. You may be referred to a health care provider who specializes in ear, nose, and throat problems (ENT or otolaryngologist) or a provider who specializes in disorders of the nervous system (neurologist). How is this treated? This condition may be treated in a session in which your health care provider moves your head in specific positions to help the displaced crystals in your inner ear move. Treatment for this condition may take several sessions. Surgery may be needed in severe cases, but this is rare. In some cases, benign positional vertigo may resolve on its own in 2-4 weeks. Follow these instructions at home: Safety Move slowly. Avoid sudden body or head movements or certain positions, as told by your health care provider. Avoid driving or operating machinery until your health care provider says it is safe. Avoid doing any tasks that would be dangerous to you or others if vertigo occurs. If you have trouble walking or keeping your balance, try using a cane for stability. If you feel dizzy or unstable, sit down right away. Return to your normal activities as told by your health care provider. Ask your health care provider what activities are safe for you. General instructions Take over-the-counter and prescription medicines only as told by your health care provider. Drink enough fluid to keep your urine pale yellow. Keep all follow-up visits. This is important. Contact a health care provider if: You have a fever. Your condition gets worse or you develop new symptoms. Your family or friends notice any behavioral changes. You  have nausea or vomiting that gets worse. You have numbness or a prickling and tingling sensation. Get help right away if you: Have difficulty speaking or moving. Are always dizzy or faint. Develop severe headaches. Have weakness in  your legs or arms. Have changes in your hearing or vision. Develop a stiff neck. Develop sensitivity to light. These symptoms may represent a serious problem that is an emergency. Do not wait to see if the symptoms will go away. Get medical help right away. Call your local emergency services (911 in the [place] Do not drive yourself to the hospital. Summary Vertigo is the feeling that you or your surroundings are moving when they are not. Benign positional vertigo is the most common form of vertigo. This condition is caused by calcium crystals in the inner ear that become displaced. This causes a disturbance in an area of the inner ear that helps your brain sense movement and balance. Symptoms include loss of balance and falling, feeling that you or your surroundings are moving, nausea and vomiting, and blurred vision. This condition can be diagnosed based on symptoms, a physical exam, and positional tests. Follow safety instructions as told by your health care provider and keep all follow-up visits. This is important. This information is not intended to replace advice given to you by your health care provider. Make sure you discuss any questions you have with your health care provider. Document Revised: 06/27/2020 Document Reviewed: 06/27/2020 Elsevier Patient Education  2022 [place] to Side-Lying    Sit on edge of bed. 1. Turn head 45 to right. 2. Maintain head position and lie down slowly on left side. Hold until symptoms subside. 3. Sit up slowly. Hold until symptoms subside. 4. Turn head 45 to left. 5. Maintain head position and lie down slowly on right side. Hold until symptoms subside. 6. Sit up slowly. Repeat sequence _3-5___ times per session. Do _5___ sessions per day.   OR EPLEY   Self Treatment for Left Posterior / Anterior Canalithiasis    Sitting on bed: 1. Turn head 45 left. (a) Lie back slowly, shoulders on pillow, head on bed. (b) Hold _20-30___  seconds. 2. Keeping head on bed, turn head 90 right. Hold __20-30__ seconds. 3. Roll to right, head on 45 angle down toward bed. Hold __20-30__ seconds. 4. Sit up on right side of bed. Repeat __3__ times per session. Do __2__ sessions per day.   How to Perform the Epley Maneuver The Epley maneuver is an exercise that relieves symptoms of vertigo. Vertigo is the feeling that you or your surroundings are moving when they are not. When you feel vertigo, you may feel like the room is spinning and may have trouble walking. The Epley maneuver is used for a type of vertigo caused by a calcium deposit in a part of the inner ear. The maneuver involves changing head positions to help the deposit move out of the area. You can do this maneuver at home whenever you have symptoms of vertigo. You can repeat it in 24 hours if your vertigo has not gone away. Even though the Epley maneuver may relieve your vertigo for a few weeks, it is possible that your symptoms will return. This maneuver relieves vertigo, but it does not relieve dizziness. What are the risks? If it is done correctly, the Epley maneuver is considered safe. Sometimes it can lead to dizziness or nausea that goes away after a short time. If you develop other symptoms--such as  changes in vision, weakness, or numbness--stop doing the maneuver and call your health care provider. Supplies needed: A bed or table. A pillow. How to do the Epley maneuver   Sit on the edge of a bed or table with your back straight and your legs extended or hanging over the edge of the bed or table. Turn your head halfway toward the affected ear or side as told by your health care provider. Lie backward quickly with your head turned until you are lying flat on your back. Your head should dangle (head-hanging position). You may want to position a pillow under your shoulders. Hold this position for at least 30 seconds. If you feel dizzy or have symptoms of vertigo, continue to  hold the position until the symptoms stop. Turn your head to the opposite direction until your unaffected ear is facing down. Your head should continue to dangle. Hold this position for at least 30 seconds. If you feel dizzy or have symptoms of vertigo, continue to hold the position until the symptoms stop. Turn your whole body to the same side as your head so that you are positioned on your side. Your head will now be nearly facedown and no longer needs to dangle. Hold for at least 30 seconds. If you feel dizzy or have symptoms of vertigo, continue to hold the position until the symptoms stop. Sit back up. You can repeat the maneuver in 24 hours if your vertigo does not go away. Follow these instructions at home: For 24 hours after doing the Epley maneuver: Keep your head in an upright position. When lying down to sleep or rest, keep your head raised (elevated) with two or more pillows. Avoid excessive neck movements. Activity Do not drive or use machinery if you feel dizzy. After doing the Epley maneuver, return to your normal activities as told by your health care provider. Ask your health care provider what activities are safe for you. General instructions Drink enough fluid to keep your urine pale yellow. Do not drink alcohol. Take over-the-counter and prescription medicines only as told by your health care provider. Keep all follow-up visits. This is important. Preventing vertigo symptoms Ask your health care provider if there is anything you should do at home to prevent vertigo. He or she may recommend that you: Keep your head elevated with two or more pillows while you sleep. Do not sleep on the side of your affected ear. Get up slowly from bed. Avoid sudden movements during the day. Avoid extreme head positions or movement, such as looking up or bending over. Contact a health care provider if: Your vertigo gets worse. You have other symptoms,  including: Nausea. Vomiting. Headache. Get help right away if you: Have vision changes. Have a headache or neck pain that is severe or getting worse. Cannot stop vomiting. Have new numbness or weakness in any part of your body. These symptoms may represent a serious problem that is an emergency. Do not wait to see if the symptoms will go away. Get medical help right away. Call your local emergency services (911 in the [place] Do not drive yourself to the hospital. Summary Vertigo is the feeling that you or your surroundings are moving when they are not. The Epley maneuver is an exercise that relieves symptoms of vertigo. If the Epley maneuver is done correctly, it is considered safe. This information is not intended to replace advice given to you by your health care provider. Make sure you discuss any questions you have  with your health care provider. Document Revised: 06/27/2020 Document Reviewed: 06/27/2020 Elsevier Patient Education  2022 [employer].

## 2021-10-14 ENCOUNTER — Other Ambulatory Visit: Payer: Self-pay | Admitting: Family Medicine

## 2021-10-14 DIAGNOSIS — E1140 Type 2 diabetes mellitus with diabetic neuropathy, unspecified: Secondary | ICD-10-CM

## 2021-10-17 ENCOUNTER — Encounter: Payer: Medicare Other | Admitting: Physical Therapy

## 2021-10-24 ENCOUNTER — Encounter: Payer: Medicare Other | Admitting: Physical Therapy

## 2021-10-31 ENCOUNTER — Encounter: Payer: Medicare Other | Admitting: Physical Therapy

## 2021-12-17 ENCOUNTER — Other Ambulatory Visit: Payer: Self-pay | Admitting: Family Medicine

## 2021-12-17 DIAGNOSIS — Z8781 Personal history of (healed) traumatic fracture: Secondary | ICD-10-CM

## 2021-12-17 DIAGNOSIS — M8000XA Age-related osteoporosis with current pathological fracture, unspecified site, initial encounter for fracture: Secondary | ICD-10-CM

## 2021-12-23 ENCOUNTER — Ambulatory Visit: Payer: Medicare Other | Admitting: Podiatry

## 2021-12-25 ENCOUNTER — Other Ambulatory Visit: Payer: Self-pay | Admitting: Family Medicine

## 2021-12-25 DIAGNOSIS — G8929 Other chronic pain: Secondary | ICD-10-CM

## 2021-12-30 ENCOUNTER — Other Ambulatory Visit: Payer: Self-pay | Admitting: Family Medicine

## 2022-01-06 ENCOUNTER — Other Ambulatory Visit: Payer: Self-pay | Admitting: Family Medicine

## 2022-01-07 DIAGNOSIS — M25561 Pain in right knee: Secondary | ICD-10-CM | POA: Diagnosis not present

## 2022-01-14 ENCOUNTER — Encounter: Payer: Self-pay | Admitting: *Deleted

## 2022-01-17 ENCOUNTER — Ambulatory Visit (INDEPENDENT_AMBULATORY_CARE_PROVIDER_SITE_OTHER): Payer: Medicare Other | Admitting: Family Medicine

## 2022-01-17 ENCOUNTER — Encounter: Payer: Self-pay | Admitting: Family Medicine

## 2022-01-17 VITALS — BP 147/57 | HR 61 | Ht 68.0 in | Wt 294.2 lb

## 2022-01-17 DIAGNOSIS — D649 Anemia, unspecified: Secondary | ICD-10-CM | POA: Diagnosis not present

## 2022-01-17 DIAGNOSIS — N1832 Chronic kidney disease, stage 3b: Secondary | ICD-10-CM | POA: Diagnosis not present

## 2022-01-17 DIAGNOSIS — N1831 Chronic kidney disease, stage 3a: Secondary | ICD-10-CM

## 2022-01-17 DIAGNOSIS — E119 Type 2 diabetes mellitus without complications: Secondary | ICD-10-CM

## 2022-01-17 DIAGNOSIS — I10 Essential (primary) hypertension: Secondary | ICD-10-CM

## 2022-01-17 DIAGNOSIS — D509 Iron deficiency anemia, unspecified: Secondary | ICD-10-CM

## 2022-01-17 DIAGNOSIS — B353 Tinea pedis: Secondary | ICD-10-CM

## 2022-01-17 LAB — POCT GLYCOSYLATED HEMOGLOBIN (HGB A1C): HbA1c, POC (controlled diabetic range): 6.2 % (ref 0.0–7.0)

## 2022-01-17 MED ORDER — KETOCONAZOLE 2 % EX CREA: TOPICAL_CREAM | CUTANEOUS | 1 refills | Status: DC

## 2022-01-17 NOTE — Patient Instructions (Signed)
It has been a pleasure being your doctor. Be sure to take time to eat the food you cook! Hopefully, one day we will cross paths again.    Please check-out at the front desk before leaving the clinic. I'd like to see you back in 3 months but if you need to be seen earlier than that for any new issues we're happy to fit you in, just give [name] a call!  Visit Remembers: - Stop by the pharmacy to pick up your prescriptions  - Continue to work on your healthy eating habits and incorporating exercise into your daily life.   Regarding lab work today:  Due to recent changes in healthcare laws, you may see the results of your imaging and laboratory studies on MyChart before your provider has had a chance to review them.  I understand that in some cases there may be results that are confusing or concerning to you. Not all laboratory results come back in the same time frame and you may be waiting for multiple results in order to interpret others.  Please give [name] 72 hours in order for your provider to thoroughly review all the results before contacting the office for clarification of your results. If everything is normal, you will get a letter in the mail or a message in My Chart. Please give [name] a call if you do not hear from [name] after 2 weeks.  Please bring all of your medications with you to each visit.   Feel free to call with any questions or concerns at any time, at [phone].   Many Blessings,    Dr. Susa Simmonds

## 2022-01-17 NOTE — Progress Notes (Unsigned)
   SUBJECTIVE:   CHIEF COMPLAINT / HPI:     Erin Coffey is a 76 y.o. female here for:    Diabetes Watches what she eats.  Does not eat 3 meals a day.  She likes to cook for others but does not regularly cook at home because she lives by herself and it is too much food.  Snacks often.  Likes to eat veggies.  Fall Reports a fall 2 weeks ago while carrying baked beans.  States she could have caught herself if she was not trying to hold onto the beans.  Has pain in her right knee with hx of OA.  Is due for another gel shots with Ortho soon.  The shot in her right knee previously lasted about 10 years.  She is looking forward to this.  Takes tramadol as needed for pain.  PERTINENT  PMH / PSH: reviewed and updated as appropriate   OBJECTIVE:   BP (!) 147/57   Pulse 61   Ht '5\' 8"'$  (1.727 m)   Wt 294 lb 4 oz (133.5 kg)   SpO2 99%   BMI 44.74 kg/m    General: Well-appearing pleasant older female in no acute distress Cardiovascular: Regular rate and rhythm Respiratory: Speaking in full sentences without pause, clear to auscultation bilaterally Musculoskeletal: No edema or swelling to the right knee.   Skin: Scaly hyperpigmented macules on right foot c/w tinea pedis   ASSESSMENT/PLAN:   HYPERTENSION, BENIGN ESSENTIAL BP 147/57.  She had some pain in her right knee.  Slight elevation could be due to this.  Continue amlodipine 5 mg and losartan 100 mg.  Type 2 diabetes mellitus with hemoglobin A1c goal of less than 7.5% (HCC) Well-controlled with diet alone.  A1c today 6.2 remaining below goal.  Patient has an eye exam in August.  Tinea pedis of right foot Refilled ketoconazole cream.  Normocytic anemia Obtain CBC today.   Chronic kidney disease, stage 3a (HCC) Obtain BMP. Avoid renally toxic medications.    Healthcare maintenance Has a mammogram on June 12.  Has an eye exam in August.  [name], DO PGY-3, [facility] 01/19/2022

## 2022-01-18 LAB — CBC
Hematocrit: 34.2 % (ref 34.0–46.6)
Hemoglobin: 11.2 g/dL (ref 11.1–15.9)
MCH: 29 pg (ref 26.6–33.0)
MCHC: 32.7 g/dL (ref 31.5–35.7)
MCV: 89 fL (ref 79–97)
Platelets: 222 10*3/uL (ref 150–450)
RBC: 3.86 x10E6/uL (ref 3.77–5.28)
RDW: 13.9 % (ref 11.7–15.4)
WBC: 5.1 10*3/uL (ref 3.4–10.8)

## 2022-01-18 LAB — BASIC METABOLIC PANEL
BUN/Creatinine Ratio: 15 (ref 12–28)
BUN: 19 mg/dL (ref 8–27)
CO2: 23 mmol/L (ref 20–29)
Calcium: 9.7 mg/dL (ref 8.7–10.3)
Chloride: 104 mmol/L (ref 96–106)
Creatinine, Ser: 1.26 mg/dL — ABNORMAL HIGH (ref 0.57–1.00)
Glucose: 104 mg/dL — ABNORMAL HIGH (ref 70–99)
Potassium: 4.8 mmol/L (ref 3.5–5.2)
Sodium: 139 mmol/L (ref 134–144)
eGFR: 45 mL/min/{1.73_m2} — ABNORMAL LOW (ref >59)

## 2022-01-19 ENCOUNTER — Encounter: Payer: Self-pay | Admitting: Family Medicine

## 2022-01-19 NOTE — Assessment & Plan Note (Signed)
Obtain CBC today.

## 2022-01-19 NOTE — Assessment & Plan Note (Signed)
Obtain BMP. Avoid renally toxic medications.

## 2022-01-19 NOTE — Assessment & Plan Note (Signed)
Well-controlled with diet alone.  A1c today 6.2 remaining below goal.  Patient has an eye exam in August.

## 2022-01-19 NOTE — Assessment & Plan Note (Signed)
Refilled ketoconazole cream

## 2022-01-19 NOTE — Assessment & Plan Note (Signed)
BP 147/57.  She had some pain in her right knee.  Slight elevation could be due to this.  Continue amlodipine 5 mg and losartan 100 mg.

## 2022-01-20 DIAGNOSIS — Z1231 Encounter for screening mammogram for malignant neoplasm of breast: Secondary | ICD-10-CM | POA: Diagnosis not present

## 2022-01-20 DIAGNOSIS — M1711 Unilateral primary osteoarthritis, right knee: Secondary | ICD-10-CM | POA: Diagnosis not present

## 2022-01-24 ENCOUNTER — Encounter: Payer: Self-pay | Admitting: Family Medicine

## 2022-01-24 DIAGNOSIS — R928 Other abnormal and inconclusive findings on diagnostic imaging of breast: Secondary | ICD-10-CM | POA: Diagnosis not present

## 2022-01-24 DIAGNOSIS — R922 Inconclusive mammogram: Secondary | ICD-10-CM | POA: Diagnosis not present

## 2022-01-24 DIAGNOSIS — N6489 Other specified disorders of breast: Secondary | ICD-10-CM | POA: Diagnosis not present

## 2022-01-27 DIAGNOSIS — M1711 Unilateral primary osteoarthritis, right knee: Secondary | ICD-10-CM | POA: Diagnosis not present

## 2022-02-03 ENCOUNTER — Encounter: Payer: Self-pay | Admitting: Family Medicine

## 2022-02-03 DIAGNOSIS — M1711 Unilateral primary osteoarthritis, right knee: Secondary | ICD-10-CM | POA: Diagnosis not present

## 2022-02-14 ENCOUNTER — Other Ambulatory Visit: Payer: Self-pay

## 2022-02-14 DIAGNOSIS — D0511 Intraductal carcinoma in situ of right breast: Secondary | ICD-10-CM | POA: Diagnosis not present

## 2022-02-17 ENCOUNTER — Ambulatory Visit
Admission: RE | Admit: 2022-02-17 | Discharge: 2022-02-17 | Disposition: A | Discharge status: Home / Self Care | Payer: Medicare Other | Source: Ambulatory Visit | Attending: Neurosurgery | Admitting: Neurosurgery

## 2022-02-17 ENCOUNTER — Encounter: Payer: Self-pay | Admitting: Family Medicine

## 2022-02-17 DIAGNOSIS — M546 Pain in thoracic spine: Secondary | ICD-10-CM

## 2022-02-17 DIAGNOSIS — D497 Neoplasm of unspecified behavior of endocrine glands and other parts of nervous system: Secondary | ICD-10-CM

## 2022-02-18 ENCOUNTER — Telehealth: Payer: Self-pay | Admitting: Hematology and Oncology

## 2022-02-18 ENCOUNTER — Telehealth: Payer: Self-pay | Admitting: *Deleted

## 2022-02-18 NOTE — Telephone Encounter (Signed)
Left message for patient to return call in reference to the clinic on 7/19

## 2022-02-18 NOTE — Telephone Encounter (Signed)
Spoke to patient to confirm morning clinic for 7/26, will send paperwork

## 2022-02-20 ENCOUNTER — Other Ambulatory Visit: Payer: Self-pay | Admitting: Family Medicine

## 2022-02-24 ENCOUNTER — Ambulatory Visit
Admission: RE | Admit: 2022-02-24 | Discharge: 2022-02-24 | Disposition: A | Discharge status: Home / Self Care | Payer: Medicare Other | Source: Ambulatory Visit | Attending: Neurosurgery | Admitting: Neurosurgery

## 2022-02-24 DIAGNOSIS — D497 Neoplasm of unspecified behavior of endocrine glands and other parts of nervous system: Secondary | ICD-10-CM

## 2022-02-24 DIAGNOSIS — I611 Nontraumatic intracerebral hemorrhage in hemisphere, cortical: Secondary | ICD-10-CM | POA: Diagnosis not present

## 2022-02-24 DIAGNOSIS — R220 Localized swelling, mass and lump, head: Secondary | ICD-10-CM | POA: Diagnosis not present

## 2022-02-24 DIAGNOSIS — I6782 Cerebral ischemia: Secondary | ICD-10-CM | POA: Diagnosis not present

## 2022-02-24 DIAGNOSIS — M546 Pain in thoracic spine: Secondary | ICD-10-CM

## 2022-02-24 DIAGNOSIS — D352 Benign neoplasm of pituitary gland: Secondary | ICD-10-CM | POA: Diagnosis not present

## 2022-02-24 MED ORDER — GADOBENATE DIMEGLUMINE 529 MG/ML IV SOLN
10.0000 mL | Freq: Once | INTRAVENOUS | Status: AC | PRN
Start: 2022-02-24 — End: 2022-02-24
  Administered 2022-02-24: 10 mL via INTRAVENOUS

## 2022-02-25 ENCOUNTER — Encounter: Payer: Self-pay | Admitting: Family Medicine

## 2022-03-03 ENCOUNTER — Other Ambulatory Visit: Payer: Medicare Other

## 2022-03-03 ENCOUNTER — Encounter: Payer: Self-pay | Admitting: *Deleted

## 2022-03-03 DIAGNOSIS — D0511 Intraductal carcinoma in situ of right breast: Secondary | ICD-10-CM

## 2022-03-03 NOTE — Progress Notes (Signed)
Radiation Oncology         [phone] ________________________________  Name: Erin Coffey        MRN: [id number]  Date of Service: 03/05/2022 DOB: 1945-11-02  CC:Alfredo Martinez, MD  Harriette Bouillon, MD     REFERRING PHYSICIAN: Harriette Bouillon, MD   DIAGNOSIS: The encounter diagnosis was Ductal carcinoma in situ (DCIS) of right breast.   HISTORY OF PRESENT ILLNESS: Erin Coffey is a 76 y.o. female seen in the multidisciplinary breast clinic for a new diagnosis of right breast cancer. The patient was noted to have screening detected calcifications and asymmetry in the lower outer quadrant of the right breast. She proceeded with diagnostic ultrasound on 01/24/22 and this showed no correlate but her axilla was negative for adenopathy. Stereotactic biopsy on 02/14/22 showed a low to intermediate grade DCIS suspicious for microinvasion that was ER/PR positive. She's seen today to discuss treatment recommendations of her cancer.     PREVIOUS RADIATION THERAPY: {EXAM; YES/NO:19492::"No"}   PAST MEDICAL HISTORY:  Past Medical History:  Diagnosis Date   Allergy    Anemia, iron deficiency    ANXIETY, SITUATIONAL 04/06/2009   Qualifier: Diagnosis of  By: Sandi Mealy  MD, Stephanie     Arthritis    Back pain, thoracic 12/20/2012   Carpal tunnel syndrome 12/07/2014   Cholelithiasis 06/25/2012   Chronic kidney disease    DM type 2 goal A1C below 7.5 06/05/2008   Qualifier: Diagnosis of  By: Sandi Mealy  MD, Stephanie     GASTROESOPHAGEAL REFLUX, NO ESOPHAGITIS 10/08/2006   Qualifier: Diagnosis of  By: De Burrs, unspecified 10/21/2007   Qualifier: Diagnosis of  By: Gavin Potters MD, [name]     History of DVT (deep vein thrombosis) 05/05/2013   on Eliquis   HYPERLIPIDEMIA 08/08/2008   Qualifier: Diagnosis of  By: Sandi Mealy  MD, Stephanie     HYPERTENSION, BENIGN ESSENTIAL 11/27/2006   Qualifier: Diagnosis of  ByGavin Potters MD, [name]     Mild coronary artery disease 10/2019   Coronary CT  Angiogram: Coronary calcium score 46.Normal coronary origin.  Left dominance with tortuous coronary arteries.  Nondominant RCA - prox < 25% followed by ectasia (dilated to 5.5 mm) mixed plaque in distal vessel.  LM- normal . LAD: minimal mixed plaque in ostial & proximal (<25%) with brief IM bridging in mLAD. Mild mid-distal (25-49%), RI - prox <25%, Large LCx- minimal (<25%) mid-dista   Mild coronary artery disease    Personal history of colonic polyps 10/24/2009   Qualifier: Diagnosis of  By: Arlyce Dice MD, Barbette Hair    [place] 08/08/2009   Qualifier: Diagnosis of  By: [place], [name]     Pituitary adenoma [facility])    Polymyalgia rheumatica (HCC) 10/02/2009   Qualifier: Diagnosis of  By: Hulen Luster MD, Fleet Contras     Pulmonary embolism [facility]) - On long Term anticoagulation (Z79.01) 04/28/2011   Converted from warfarin to Eliquis; on long-term anticoagulation.   Thyroid disease    Trigger ring finger of left hand 11/02/2014       PAST SURGICAL HISTORY: Past Surgical History:  Procedure Laterality Date   ABDOMINAL HYSTERECTOMY     CARDIAC CATHETERIZATION  04/29/11   (Dr. Riley Kill - [facility]): LM - normal. LAD with 1 major Diag - normal, bifurcating Ramus - normal, Large (6-7 mm) Dominant LCx with 1 OM, 2 LPL branches & LPDA - non significant disease; small non-dominant RCA.     CARDIAC EVENT MONITOR  09/2019  Mostly sinus rhythm, average rate 60 bpm.  Range 45 to 190 bpm.  No critical events indicating any arrhythmias or pauses.  For patient responses noted PVCs.  Less than 1% PVCs noted.    CARPAL TUNNEL RELEASE Right 1996   COLONOSCOPY     KNEE ARTHROSCOPY  Bilateral   TRANSTHORACIC ECHOCARDIOGRAM  09/2019   Normal EF - 60 to 65%.  Mild LVH.  GR 1 DD.  Normal RV.  Trivial MR.  Normal IVC.  Normal PA pressures.   TUBAL LIGATION     UPPER GASTROINTESTINAL ENDOSCOPY       FAMILY HISTORY:  Family History  Problem Relation Age of Onset   Diabetes Mother    Heart disease Mother    Hypertension  Mother    Heart disease Father    Hypertension Father    Diabetes Sister    Heart disease Sister    Hypertension Brother    Cancer Sister 55       lukemia   Cirrhosis Sister        alcohol   Kidney disease Sister    Diabetes Sister    Diabetes Brother    Colon cancer Neg Hx    Pancreatic cancer Neg Hx    Esophageal cancer Neg Hx      SOCIAL HISTORY:  reports that she has never smoked. She has never used smokeless tobacco. She reports that she does not drink alcohol and does not use drugs. The patient is widowed and lives in [place].   ALLERGIES: Lisinopril   MEDICATIONS:  Current Outpatient Medications  Medication Sig Dispense Refill   acetaminophen (TYLENOL) 650 MG CR tablet Take 1,300 mg by mouth every 8 (eight) hours as needed for pain.     alendronate (FOSAMAX) 70 MG tablet TAKE 1 TABLET BY MOUTH  WEEKLY WITH 8 OZ OF PLAIN  WATER 30 MINUTES BEFORE  FIRST FOOD, DRINK OR MEDS.  STAY UPRIGHT FOR 30 MINS 12 tablet 3   amLODipine (NORVASC) 5 MG tablet TAKE 1 TABLET BY MOUTH AT  BEDTIME 90 tablet 3   atorvastatin (LIPITOR) 40 MG tablet TAKE 1 TABLET BY MOUTH  DAILY 100 tablet 2   baclofen (LIORESAL) 10 MG tablet Take 1 tablet (10 mg total) by mouth daily as needed for muscle spasms. 30 each 0   Blood Glucose Monitoring Suppl (ONETOUCH VERIO) w/Device KIT Use as instructed to test sugars once daily.  Dx Code: E11.9 1 kit 0   CHOLECALCIFEROL PO Take 2,200 Units by mouth daily.     ELIQUIS 5 MG TABS tablet TAKE 1 TABLET BY MOUTH  TWICE DAILY 180 tablet 3   fish oil-omega-3 fatty acids 1000 MG capsule Take 1 g by mouth daily.     gabapentin (NEURONTIN) 100 MG capsule TAKE 1 CAPSULE BY MOUTH TWICE  DAILY 180 capsule 3   glucose blood (ONETOUCH VERIO) test strip Use as instructed to test sugars once daily.  Dx Code: E11.9 100 each 12   iron polysaccharides (NIFEREX) 150 MG capsule      ketoconazole (NIZORAL) 2 % cream Apply to both feet once daily for 4 weeks. 30 g 1   Lancet  Devices (ONE TOUCH DELICA LANCING DEV) MISC Use as instructed to test sugars once daily.  Dx Code: E11.9 1 each 0   losartan (COZAAR) 100 MG tablet Take 1 tablet (100 mg total) by mouth daily. 90 tablet 1   metoprolol tartrate (LOPRESSOR) 25 MG tablet TAKE 1 TABLET BY MOUTH  TWICE  DAILY 200 tablet 2   omeprazole (PRILOSEC) 20 MG capsule Take 1 capsule (20 mg total) by mouth daily. 30 capsule 0   OneTouch Delica Lancets 10C MISC Use as instructed to test sugars once daily.  Dx Code: E11.9 100 each 12   polyethylene glycol powder (GLYCOLAX/MIRALAX) powder Take 17 g by mouth 2 (two) times daily as needed. (Patient taking differently: Take 17 g by mouth daily as needed (constipation).) 3350 g 1   traMADol (ULTRAM) 50 MG tablet TAKE 1 TABLET BY MOUTH EVERY 6 HOURS AS NEEDED 30 tablet 0   vitamin C (ASCORBIC ACID) 500 MG tablet Take 500 mg by mouth daily.     Current Facility-Administered Medications  Medication Dose Route Frequency Provider Last Rate Last Admin   0.9 %  sodium chloride infusion  500 mL Intravenous Once Nelida Meuse III, MD         REVIEW OF SYSTEMS: On review of systems, the patient reports that she is doing ***     PHYSICAL EXAM:  Wt Readings from Last 3 Encounters:  01/17/22 294 lb 4 oz (133.5 kg)  09/30/21 292 lb 12.8 oz (132.8 kg)  09/02/21 289 lb 12.8 oz (131.5 kg)   Temp Readings from Last 3 Encounters:  07/26/21 99.9 F (37.7 C)  05/03/21 98.9 F (37.2 C)  12/26/20 97.8 F (36.6 C) (Oral)   BP Readings from Last 3 Encounters:  01/17/22 (!) 147/57  09/02/21 (!) 144/74  07/30/21 (!) 141/74   Pulse Readings from Last 3 Encounters:  01/17/22 61  09/02/21 65  07/30/21 100    In general this is a well appearing *** female in no acute distress. She's alert and oriented x4 and appropriate throughout the examination. Cardiopulmonary assessment is negative for acute distress and she exhibits normal effort. Bilateral breast exam is deferred.    ECOG =  ***  0 - Asymptomatic (Fully active, able to carry on all predisease activities without restriction)  1 - Symptomatic but completely ambulatory (Restricted in physically strenuous activity but ambulatory and able to carry out work of a light or sedentary nature. For example, light housework, office work)  2 - Symptomatic, <50% in bed during the day (Ambulatory and capable of all self care but unable to carry out any work activities. Up and about more than 50% of waking hours)  3 - Symptomatic, >50% in bed, but not bedbound (Capable of only limited self-care, confined to bed or chair 50% or more of waking hours)  4 - Bedbound (Completely disabled. Cannot carry on any self-care. Totally confined to bed or chair)  5 - Death   Eustace Pen MM, Creech RH, Tormey DC, et al. [id number]). "Toxicity and response criteria of the [facility]". [place] Oncol. 5 (6): 649-55    LABORATORY DATA:  Lab Results  Component Value Date   WBC 5.1 01/17/2022   HGB 11.2 01/17/2022   HCT 34.2 01/17/2022   MCV 89 01/17/2022   PLT 222 01/17/2022   Lab Results  Component Value Date   NA 139 01/17/2022   K 4.8 01/17/2022   CL 104 01/17/2022   CO2 23 01/17/2022   Lab Results  Component Value Date   ALT 13 05/02/2021   AST 21 05/02/2021   ALKPHOS 51 05/02/2021   BILITOT 0.6 05/02/2021      RADIOGRAPHY: MR BRAIN W WO CONTRAST  Result Date: 02/25/2022 CLINICAL DATA:  Provided history: Pituitary tumor. EXAM: MRI HEAD WITHOUT AND WITH CONTRAST  TECHNIQUE: Multiplanar, multiecho pulse sequences of the brain and surrounding structures were obtained without and with intravenous contrast. CONTRAST:  58mL MULTIHANCE GADOBENATE DIMEGLUMINE 529 MG/ML IV SOLN COMPARISON:  Prior brain MRI examinations 09/14/2021 and earlier. FINDINGS: Mild intermittent motion degradation. Brain: 13 x 18 x 15 mm sellar/suprasellar mass eccentric to the right, stable in size from the prior brain MRI of 09/14/2021  (remeasured on prior). As before, there is suprasellar extension with slight superior displacement of the optic chiasm. The pituitary stalk is deviated to the left. Also similar to the prior examination, the mass extends toward the right cavernous sinus, but there is no appreciable extension beyond the lateral tangent. Multifocal T2 FLAIR hyperintense signal abnormality within the cerebral white matter, nonspecific but compatible with mild chronic small vessel ischemic disease. Punctate chronic microhemorrhage within the anterior right frontal lobe (series 6, image 13). There is no acute infarct. No extra-axial fluid collection. No midline shift. Vascular: Maintained flow voids within the proximal large arterial vessels. Skull and upper cervical spine: No focal suspicious marrow lesion. Sinuses/Orbits: No mass or acute finding within the imaged orbits. No significant paranasal sinus disease. IMPRESSION: 13 x 18 x 15 mm sellar/suprasellar mass eccentric to the right, unchanged from the prior brain MRI of 09/14/2021 and consistent with a pituitary macroadenoma. As before, there is mild superior displaced of the optic chiasm. Also unchanged, the mass extends toward the right cavernous sinus without definite cavernous sinus invasion. Mild chronic small vessel ischemic changes within the cerebral white matter, stable. Electronically Signed   By: Kellie Simmering D.O.   On: 02/25/2022 11:29   MR THORACIC SPINE WO CONTRAST  Result Date: 02/18/2022 CLINICAL DATA:  Pain in thoracic spine M54.6 (ICD-10-CM). Pituitary tumor D49.7 (ICD-10-CM). EXAM: MRI THORACIC SPINE WITHOUT CONTRAST TECHNIQUE: Multiplanar, multisequence MR imaging of the thoracic spine was performed. No intravenous contrast was administered. COMPARISON:  MRI of the thoracic spine January 20, 2020 and priors. FINDINGS: Alignment: No listhesis present. Mildly accentuated kyphosis when compared to prior MRI. Vertebrae: No acute fracture, evidence of discitis, or  aggressive bone lesion. Stable appearance of remote L1 superior endplate fracture with approximately 50% height loss. Cord:  Visualized spinal cord has normal signal characteristics. Paraspinal and other soft tissues: Multiple T2 hyperintense lesions of the spleen, as seen prior studies. Disc levels: At T2-3, small central disc protrusion causes small indentation the thecal sac without significant spinal canal or neural foraminal stenosis, unchanged. At T4-5, a well-defined dorsal epidural cystic lesion measuring approximately 2 x 1.2 x 0.7 cm causes minimal mass effect on the thecal sac and appears stable since 2014. At T9-10, facet degenerative changes resulting in minimal left neural foraminal narrowing. At T10-11, shallow disc bulge and moderate facet degenerative changes resulting in minimal spinal canal stenosis. IMPRESSION: 1. Mild degenerative changes of the thoracic spine without high-grade spinal canal or neural foraminal stenosis. 2. Dorsal epidural cyst at the T4-5 level remains stable since 2014 and exerts only mild mass effect on the thecal sac. Electronically Signed   By: Pedro Earls M.D.   On: 02/18/2022 14:04       IMPRESSION/PLAN: 1. Low to Intermediate grade, ER/PR positive DCIS of the right breast. Dr. Lisbeth Renshaw discusses the pathology findings and reviews the nature of *** breast disease. The consensus from the breast conference includes breast conservation with lumpectomy. Dr. Lisbeth Renshaw discussed the rationale for external radiotherapy to the breast  to reduce risks of local recurrence followed by antiestrogen therapy. Dr. Lisbeth Renshaw also  discusses cases in which radiation may be optional for favorable cases based on final pathology. We discussed the risks, benefits, short, and long term effects of radiotherapy, as well as the curative intent, and the patient is interested in proceeding. Dr. Lisbeth Renshaw discusses the delivery and logistics of radiotherapy and anticipates a course of 4 weeks  of radiotherapy to the right breast. We will see her back a few weeks after surgery to discuss the simulation process and anticipate we starting radiotherapy about 4-6 weeks after surgery.  2. Possible genetic predisposition to malignancy. The patient is a candidate for genetic testing given  her personal history. She was offered referral and will meet genetics today.  3. History of pituitary macroadenoma. She will follow along with Dr. Christella Noa.   In a visit lasting *** minutes, greater than 50% of the time was spent face to face reviewing her case, as well as in preparation of, discussing, and coordinating the patient's care.  The above documentation reflects my direct findings during this shared patient visit. Please see the separate note by Dr. Lisbeth Renshaw on this date for the remainder of the patient's plan of care.    Carola Rhine, [facility]    **Disclaimer: This note was dictated with voice recognition software. Similar sounding words can inadvertently be transcribed and this note may contain transcription errors which may not have been corrected upon publication of note.**

## 2022-03-05 ENCOUNTER — Inpatient Hospital Stay: Payer: Medicare Other | Admitting: Licensed Clinical Social Worker

## 2022-03-05 ENCOUNTER — Encounter: Payer: Self-pay | Admitting: *Deleted

## 2022-03-05 ENCOUNTER — Ambulatory Visit: Payer: Self-pay | Admitting: Surgery

## 2022-03-05 ENCOUNTER — Other Ambulatory Visit: Payer: Self-pay

## 2022-03-05 ENCOUNTER — Inpatient Hospital Stay: Payer: Medicare Other | Attending: Hematology and Oncology

## 2022-03-05 ENCOUNTER — Ambulatory Visit
Admission: RE | Admit: 2022-03-05 | Discharge: 2022-03-05 | Disposition: A | Discharge status: Home / Self Care | Payer: Medicare Other | Source: Ambulatory Visit | Attending: Radiation Oncology | Admitting: Radiation Oncology

## 2022-03-05 ENCOUNTER — Encounter: Payer: Self-pay | Admitting: Hematology and Oncology

## 2022-03-05 ENCOUNTER — Inpatient Hospital Stay (HOSPITAL_BASED_OUTPATIENT_CLINIC_OR_DEPARTMENT_OTHER): Payer: Medicare Other | Admitting: Genetic Counselor

## 2022-03-05 ENCOUNTER — Inpatient Hospital Stay (HOSPITAL_BASED_OUTPATIENT_CLINIC_OR_DEPARTMENT_OTHER): Payer: Medicare Other | Admitting: Hematology and Oncology

## 2022-03-05 ENCOUNTER — Encounter: Payer: Self-pay | Admitting: Emergency Medicine

## 2022-03-05 ENCOUNTER — Encounter: Payer: Self-pay | Admitting: Genetic Counselor

## 2022-03-05 DIAGNOSIS — M069 Rheumatoid arthritis, unspecified: Secondary | ICD-10-CM | POA: Insufficient documentation

## 2022-03-05 DIAGNOSIS — Z79899 Other long term (current) drug therapy: Secondary | ICD-10-CM | POA: Insufficient documentation

## 2022-03-05 DIAGNOSIS — D0511 Intraductal carcinoma in situ of right breast: Secondary | ICD-10-CM

## 2022-03-05 DIAGNOSIS — Z86718 Personal history of other venous thrombosis and embolism: Secondary | ICD-10-CM

## 2022-03-05 DIAGNOSIS — Z86711 Personal history of pulmonary embolism: Secondary | ICD-10-CM | POA: Insufficient documentation

## 2022-03-05 DIAGNOSIS — Z806 Family history of leukemia: Secondary | ICD-10-CM | POA: Diagnosis not present

## 2022-03-05 DIAGNOSIS — Z7901 Long term (current) use of anticoagulants: Secondary | ICD-10-CM | POA: Insufficient documentation

## 2022-03-05 DIAGNOSIS — E119 Type 2 diabetes mellitus without complications: Secondary | ICD-10-CM | POA: Diagnosis not present

## 2022-03-05 DIAGNOSIS — I10 Essential (primary) hypertension: Secondary | ICD-10-CM | POA: Diagnosis not present

## 2022-03-05 DIAGNOSIS — Z170 Estrogen receptor positive status [ER+]: Secondary | ICD-10-CM | POA: Diagnosis not present

## 2022-03-05 DIAGNOSIS — Z803 Family history of malignant neoplasm of breast: Secondary | ICD-10-CM

## 2022-03-05 HISTORY — DX: Family history of malignant neoplasm of breast: Z80.3

## 2022-03-05 LAB — CBC WITH DIFFERENTIAL (CANCER CENTER ONLY)
Abs Immature Granulocytes: 0.01 10*3/uL (ref 0.00–0.07)
Basophils Absolute: 0 10*3/uL (ref 0.0–0.1)
Basophils Relative: 0 %
Eosinophils Absolute: 0.2 10*3/uL (ref 0.0–0.5)
Eosinophils Relative: 4 %
HCT: 34.3 % — ABNORMAL LOW (ref 36.0–46.0)
Hemoglobin: 11.2 g/dL — ABNORMAL LOW (ref 12.0–15.0)
Immature Granulocytes: 0 %
Lymphocytes Relative: 36 %
Lymphs Abs: 1.7 10*3/uL (ref 0.7–4.0)
MCH: 29.9 pg (ref 26.0–34.0)
MCHC: 32.7 g/dL (ref 30.0–36.0)
MCV: 91.5 fL (ref 80.0–100.0)
Monocytes Absolute: 0.6 10*3/uL (ref 0.1–1.0)
Monocytes Relative: 13 %
Neutro Abs: 2.2 10*3/uL (ref 1.7–7.7)
Neutrophils Relative %: 47 %
Platelet Count: 189 10*3/uL (ref 150–400)
RBC: 3.75 MIL/uL — ABNORMAL LOW (ref 3.87–5.11)
RDW: 15.5 % (ref 11.5–15.5)
WBC Count: 4.8 10*3/uL (ref 4.0–10.5)
nRBC: 0 % (ref 0.0–0.2)

## 2022-03-05 LAB — CMP (CANCER CENTER ONLY)
ALT: 14 U/L (ref 0–44)
AST: 22 U/L (ref 15–41)
Albumin: 3.8 g/dL (ref 3.5–5.0)
Alkaline Phosphatase: 56 U/L (ref 38–126)
Anion gap: 6 (ref 5–15)
BUN: 19 mg/dL (ref 8–23)
CO2: 24 mmol/L (ref 22–32)
Calcium: 9.4 mg/dL (ref 8.9–10.3)
Chloride: 109 mmol/L (ref 98–111)
Creatinine: 1.38 mg/dL — ABNORMAL HIGH (ref 0.44–1.00)
GFR, Estimated: 40 mL/min — ABNORMAL LOW (ref >60)
Glucose, Bld: 121 mg/dL — ABNORMAL HIGH (ref 70–99)
Potassium: 4.4 mmol/L (ref 3.5–5.1)
Sodium: 139 mmol/L (ref 135–145)
Total Bilirubin: 0.6 mg/dL (ref 0.3–1.2)
Total Protein: 8.3 g/dL — ABNORMAL HIGH (ref 6.5–8.1)

## 2022-03-05 LAB — GENETIC SCREENING ORDER

## 2022-03-05 NOTE — Research (Signed)
MTG-015 - Tissue and Bodily Fluids: Translational Medicine: Discovery and Evaluation of Biomarkers/Pharmacogenomics for the Diagnosis and Personalized Management of Patients   INTRO STUDY/CONSENTS  Patient Erin Coffey was identified by Dr. Chryl Heck as a potential candidate for the above listed study.  This Clinical Research Nurse met with Erin Coffey, [id number], on 03/05/22 in a manner and location that ensures patient privacy to discuss participation in the above listed research study.  Patient is Accompanied by daughter Erin Coffey.  A copy of the informed consent document and separate HIPAA Authorization was provided to the patient.  Patient reads, speaks, and understands [place].   Patient was provided with the business card of this Nurse and encouraged to contact the research team with any questions.  Approximately 20 minutes were spent with the patient reviewing the informed consent documents.  Patient was provided the option of taking informed consent documents home to review and was encouraged to review at their convenience with their support network, including other care providers. Patient took the consent documents home to review.  Will f/u with patient in the next few days to determine interest.  Wells Guiles 'Donell Sievert, RN, BSN Clinical Research Nurse I 03/05/22 11:53 AM

## 2022-03-05 NOTE — Progress Notes (Signed)
[facility]  Initial Assessment   Erin Coffey is a 76 y.o. year old female accompanied by daughter, Renford Dills. Clinical Social Work was referred by  [facility]  for assessment of psychosocial needs.   SDOH (Social Determinants of Health) assessments performed: Yes SDOH Interventions    Flowsheet Row Most Recent Value  SDOH Interventions   Food Insecurity Interventions Intervention Not Indicated  Financial Strain Interventions Intervention Not Indicated  Housing Interventions Intervention Not Indicated  Stress Interventions Intervention Not Indicated  Transportation Interventions Intervention Not Indicated       SDOH Screenings   Alcohol Screen: Low Risk  (01/05/2020)   Alcohol Screen    Last Alcohol Screening Score (AUDIT): 0  Depression (PHQ2-9): Low Risk  (01/17/2022)   Depression (PHQ2-9)    PHQ-2 Score: 2  Financial Resource Strain: Low Risk  (03/05/2022)   Overall Financial Resource Strain (CARDIA)    Difficulty of Paying Living Expenses: Not very hard  Food Insecurity: No Food Insecurity (03/05/2022)   Hunger Vital Sign    Worried About Running Out of Food in the Last Year: Never true    Ran Out of Food in the Last Year: Never true  Housing: Low Risk  (03/05/2022)   Housing    Last Housing Risk Score: 0  Physical Activity: Sufficiently Active (03/03/2019)   Exercise Vital Sign    Days of Exercise per Week: 3 days    Minutes of Exercise per Session: 60 min  Social Connections: Moderately Integrated (01/05/2020)   Social Connection and Isolation Panel [NHANES]    Frequency of Communication with Friends and Family: More than three times a week    Frequency of Social Gatherings with Friends and Family: More than three times a week    Attends Religious Services: More than 4 times per year    Active Member of [employer] or Organizations: Yes    Attends Archivist Meetings: More than 4 times per year    Marital Status: Widowed  Stress: No Stress Concern  Present (03/05/2022)   [place]    Feeling of Stress : Not at all  Tobacco Use: Low Risk  (03/05/2022)   Patient History    Smoking Tobacco Use: Never    Smokeless Tobacco Use: Never    Passive Exposure: Not on file  Transportation Needs: No Transportation Needs (03/05/2022)   PRAPARE - Transportation    Lack of Transportation (Medical): No    Lack of Transportation (Non-Medical): No     Distress Screen completed: Yes    03/05/2022   11:28 AM  ONCBCN DISTRESS SCREENING  Screening Type Initial Screening  Distress experienced in past week (1-10) 0  Information Concerns Type Lack of info about diagnosis      Family/Social Information:  Housing Arrangement: patient lives alone. Has daughters who live nearby Family members/support persons in your life? Family and [employer] concerns: no  Employment: Retired .  Income source: Paediatric nurse concerns: No Type of concern: None Food access concerns: no Religious or spiritual practice: Yes-involved in church community and volunteers with them Services Currently in place:  n/a  Coping/ Adjustment to diagnosis: Patient understands treatment plan and what happens next? yes Concerns about diagnosis and/or treatment: I'm not especially worried about anything Patient reported stressors:  No major stressors reported Patient enjoys time with family/ friends and church Current coping skills/ strengths: Capable of independent living , Religious Affiliation , and Supportive family/friends  SUMMARY: Current SDOH Barriers:  No significant SDOH barriers identified today  Clinical Social Work Clinical Goal(s):  No clinical social work goals at this time  Interventions: Discussed common feeling and emotions when being diagnosed with cancer, and the importance of support during treatment Informed patient of the support team roles and  support services at [facility] Provided [place] contact information and encouraged patient to call with any questions or concerns   Follow Up Plan: Patient will contact CSW with any support or resource needs Patient verbalizes understanding of plan: Yes    Tauriel Scronce E Marco Adelson, LCSW

## 2022-03-05 NOTE — Progress Notes (Signed)
[place] NOTE  Patient Care Team: Erskine Emery, MD as PCP - General (Family Medicine) Marica Otter, [place] (Optometry) Ashok Pall, MD as Consulting Physician (Neurosurgery) Rosita Fire, MD as Consulting Physician (Nephrology) Leonie Man, MD as Consulting Physician (Cardiology) Erroll Luna, MD as Consulting Physician (General Surgery) Benay Pike, MD as Consulting Physician (Hematology and Oncology) Kyung Rudd, MD as Consulting Physician (Radiation Oncology) Mauro Kaufmann, RN as Oncology Nurse Navigator Rockwell Germany, RN as Oncology Nurse Navigator  CHIEF COMPLAINTS/PURPOSE OF CONSULTATION:  Newly diagnosed breast cancer  HISTORY OF PRESENTING ILLNESS:  Erin Coffey 76 y.o. female is here because of recent diagnosis of right breast DCIS  I reviewed her records extensively and collaborated the history with the patient.  SUMMARY OF ONCOLOGIC HISTORY: Oncology History  Ductal carcinoma in situ (DCIS) of right breast  01/20/2022 Mammogram   Digital screening mammogram with incomplete evaluation.  Right breast focal asymmetry indeterminate. Diagnostic mammogram and ultrasound showed no correlate.  Stereotactic biopsy was recommended   02/14/2022 Pathology Results   Pathology from the right breast needle core biopsy showed DCIS, low to intermediate nuclear grade, cribriform and micropapillary types without necrosis and foci suspicious for microinvasion.  Negative for microcalcifications.  DCIS measured 2.2 mm in greatest extent   03/03/2022 Initial Diagnosis   Ductal carcinoma in situ (DCIS) of right breast    Patient is here for initial visit with her daughter.  She denies any abnormal mammograms in the past.  She took hormone replacement therapy for about 6 months after hysterectomy.  She otherwise has history of DVT and PE and continues on chronic anticoagulation, also has type 2 diabetes mellitus with previous hemoglobin  A1c of 6.2, hypertension, dyslipidemia, coronary artery disease and rheumatoid arthritis.  She is on multiple medications but tries to remain active.  No family history of breast cancer.  Sister had leukemia at a very young age Rest of the pertinent 56 point ROS reviewed and negative  MEDICAL HISTORY:  Past Medical History:  Diagnosis Date   Allergy    Anemia, iron deficiency    ANXIETY, SITUATIONAL 04/06/2009   Qualifier: Diagnosis of  By: [name]  MD, Stephanie     Arthritis    Back pain, thoracic 12/20/2012   Carpal tunnel syndrome 12/07/2014   Cholelithiasis 06/25/2012   Chronic kidney disease    DM type 2 goal A1C below 7.5 06/05/2008   Qualifier: Diagnosis of  By: [name]  MD, Stephanie     GASTROESOPHAGEAL REFLUX, NO ESOPHAGITIS 10/08/2006   Qualifier: Diagnosis of  By: Griffin Dakin, unspecified 10/21/2007   Qualifier: Diagnosis of  By: [name] MD, [name]     History of DVT (deep vein thrombosis) 05/05/2013   on Eliquis   HYPERLIPIDEMIA 08/08/2008   Qualifier: Diagnosis of  By: [name]  MD, Stephanie     HYPERTENSION, BENIGN ESSENTIAL 11/27/2006   Qualifier: Diagnosis of  ByHoy Morn MD, [name]     Mild coronary artery disease 10/2019   Coronary CT Angiogram: Coronary calcium score 46.Normal coronary origin.  Left dominance with tortuous coronary arteries.  Nondominant RCA - prox < 25% followed by ectasia (dilated to 5.5 mm) mixed plaque in distal vessel.  LM- normal . LAD: minimal mixed plaque in ostial & proximal (<25%) with brief IM bridging in mLAD. Mild mid-distal (25-49%), RI - prox <25%, Large LCx- minimal (<25%) mid-dista   Mild coronary artery disease    Personal history of colonic  polyps 10/24/2009   Qualifier: Diagnosis of  By: [name] MD, Sandy Salaam    [place] 08/08/2009   Qualifier: Diagnosis of  By: [name], [name]     Pituitary adenoma [facility])    Polymyalgia rheumatica ([place]) 10/02/2009   Qualifier: Diagnosis of  By: [name] MD, Apolonio Schneiders     Pulmonary embolism [facility]) - On  long Term anticoagulation (Z79.01) 04/28/2011   Converted from warfarin to Eliquis; on long-term anticoagulation.   Thyroid disease    Trigger ring finger of left hand 11/02/2014    SURGICAL HISTORY: Past Surgical History:  Procedure Laterality Date   ABDOMINAL HYSTERECTOMY     CARDIAC CATHETERIZATION  04/29/11   (Dr. Lia Foyer [facility]): LM - normal. LAD with 1 major Diag - normal, bifurcating Ramus - normal, Large (6-7 mm) Dominant LCx with 1 OM, 2 LPL branches & LPDA - non significant disease; small non-dominant RCA.     CARDIAC EVENT MONITOR  09/2019   Mostly sinus rhythm, average rate 60 bpm.  Range 45 to 190 bpm.  No critical events indicating any arrhythmias or pauses.  For patient responses noted PVCs.  Less than 1% PVCs noted.    CARPAL TUNNEL RELEASE Right 1996   COLONOSCOPY     KNEE ARTHROSCOPY  Bilateral   TRANSTHORACIC ECHOCARDIOGRAM  09/2019   Normal EF - 60 to 65%.  Mild LVH.  GR 1 DD.  Normal RV.  Trivial MR.  Normal IVC.  Normal PA pressures.   TUBAL LIGATION     UPPER GASTROINTESTINAL ENDOSCOPY      SOCIAL HISTORY: Social History   Socioeconomic History   Marital status: Widowed    Spouse name: Not on file   Number of children: 3   Years of education: 10   Highest education level: 10th grade  Occupational History   Occupation: Retired- Airline pilot: [employer]  Tobacco Use   Smoking status: Never   Smokeless tobacco: Never  Scientific laboratory technician Use: Never used  Substance and Sexual Activity   Alcohol use: No    Alcohol/week: 0.0 standard drinks of alcohol   Drug use: No   Sexual activity: Not Currently  Other Topics Concern   Not on file  Social History Narrative   Patient lives alone in [place], she is a widow.   Patient has 3 children. Two daughters live locally, one son in [place].    Patient is very active in her church and volunteer work to stay busy.    Patient enjoys cooking, spending time with family, and crossword puzzles.        Social Determinants of Health   Financial Resource Strain: Low Risk  (03/03/2019)   Overall Financial Resource Strain (CARDIA)    Difficulty of Paying Living Expenses: Not very hard  Food Insecurity: No Food Insecurity (03/03/2019)   Hunger Vital Sign    Worried About Running Out of Food in the Last Year: Never true    Ran Out of Food in the Last Year: Never true  Transportation Needs: No Transportation Needs (03/03/2019)   PRAPARE - Hydrologist (Medical): No    Lack of Transportation (Non-Medical): No  Physical Activity: Sufficiently Active (03/03/2019)   Exercise Vital Sign    Days of Exercise per Week: 3 days    Minutes of Exercise per Session: 60 min  Stress: No Stress Concern Present (03/03/2019)   [place]  Feeling of Stress : Not at all  Social Connections: Moderately Integrated (01/05/2020)   Social Connection and Isolation Panel [NHANES]    Frequency of Communication with Friends and Family: More than three times a week    Frequency of Social Gatherings with Friends and Family: More than three times a week    Attends Religious Services: More than 4 times per year    Active Member of [employer] or Organizations: Yes    Attends Archivist Meetings: More than 4 times per year    Marital Status: Widowed  Intimate Partner Violence: Not At Risk (03/03/2019)   Humiliation, Afraid, Rape, and Kick questionnaire    Fear of Current or Ex-Partner: No    Emotionally Abused: No    Physically Abused: No    Sexually Abused: No    FAMILY HISTORY: Family History  Problem Relation Age of Onset   Diabetes Mother    Heart disease Mother    Hypertension Mother    Heart disease Father    Hypertension Father    Diabetes Sister    Heart disease Sister    Hypertension Brother    Cancer Sister 43       lukemia   Cirrhosis Sister        alcohol   Kidney disease Sister    Diabetes  Sister    Diabetes Brother    Colon cancer Neg Hx    Pancreatic cancer Neg Hx    Esophageal cancer Neg Hx     ALLERGIES:  is allergic to lisinopril.  MEDICATIONS:  Current Outpatient Medications  Medication Sig Dispense Refill   acetaminophen (TYLENOL) 650 MG CR tablet Take 1,300 mg by mouth every 8 (eight) hours as needed for pain.     alendronate (FOSAMAX) 70 MG tablet TAKE 1 TABLET BY MOUTH  WEEKLY WITH 8 OZ OF PLAIN  WATER 30 MINUTES BEFORE  FIRST FOOD, DRINK OR MEDS.  STAY UPRIGHT FOR 30 MINS 12 tablet 3   amLODipine (NORVASC) 5 MG tablet TAKE 1 TABLET BY MOUTH AT  BEDTIME 90 tablet 3   atorvastatin (LIPITOR) 40 MG tablet TAKE 1 TABLET BY MOUTH  DAILY 100 tablet 2   baclofen (LIORESAL) 10 MG tablet Take 1 tablet (10 mg total) by mouth daily as needed for muscle spasms. 30 each 0   Blood Glucose Monitoring Suppl (ONETOUCH VERIO) w/Device KIT Use as instructed to test sugars once daily.  Dx Code: E11.9 1 kit 0   CHOLECALCIFEROL PO Take 2,200 Units by mouth daily.     ELIQUIS 5 MG TABS tablet TAKE 1 TABLET BY MOUTH  TWICE DAILY 180 tablet 3   fish oil-omega-3 fatty acids 1000 MG capsule Take 1 g by mouth daily.     gabapentin (NEURONTIN) 100 MG capsule TAKE 1 CAPSULE BY MOUTH TWICE  DAILY 180 capsule 3   glucose blood (ONETOUCH VERIO) test strip Use as instructed to test sugars once daily.  Dx Code: E11.9 100 each 12   iron polysaccharides (NIFEREX) 150 MG capsule      ketoconazole (NIZORAL) 2 % cream Apply to both feet once daily for 4 weeks. 30 g 1   Lancet Devices (ONE TOUCH DELICA LANCING DEV) MISC Use as instructed to test sugars once daily.  Dx Code: E11.9 1 each 0   losartan (COZAAR) 100 MG tablet Take 1 tablet (100 mg total) by mouth daily. 90 tablet 1   metoprolol tartrate (LOPRESSOR) 25 MG tablet TAKE 1 TABLET BY MOUTH  TWICE  DAILY 200 tablet 2   omeprazole (PRILOSEC) 20 MG capsule Take 1 capsule (20 mg total) by mouth daily. 30 capsule 0   OneTouch Delica Lancets 85I MISC  Use as instructed to test sugars once daily.  Dx Code: E11.9 100 each 12   polyethylene glycol powder (GLYCOLAX/MIRALAX) powder Take 17 g by mouth 2 (two) times daily as needed. (Patient taking differently: Take 17 g by mouth daily as needed (constipation).) 3350 g 1   traMADol (ULTRAM) 50 MG tablet TAKE 1 TABLET BY MOUTH EVERY 6 HOURS AS NEEDED 30 tablet 0   vitamin C (ASCORBIC ACID) 500 MG tablet Take 500 mg by mouth daily.     Current Facility-Administered Medications  Medication Dose Route Frequency Provider Last Rate Last Admin   0.9 %  sodium chloride infusion  500 mL Intravenous Once Danis, Estill Cotta III, MD        REVIEW OF SYSTEMS:   Constitutional: Denies fevers, chills or abnormal night sweats Eyes: Denies blurriness of vision, double vision or watery eyes Ears, nose, mouth, throat, and face: Denies mucositis or sore throat Respiratory: Denies cough, dyspnea or wheezes Cardiovascular: Denies palpitation, chest discomfort or lower extremity swelling Gastrointestinal:  Denies nausea, heartburn or change in bowel habits Skin: Denies abnormal skin rashes Lymphatics: Denies new lymphadenopathy or easy bruising Neurological:Denies numbness, tingling or new weaknesses Behavioral/Psych: Mood is stable, no new changes  Breast: Denies any palpable lumps or discharge All other systems were reviewed with the patient and are negative.  PHYSICAL EXAMINATION: ECOG PERFORMANCE STATUS: 0 - Asymptomatic  Vitals:   03/05/22 0826  BP: (!) 173/60  Pulse: 70  Resp: 18  Temp: 97.7 F (36.5 C)  SpO2: 100%   Filed Weights   03/05/22 0826  Weight: 294 lb 8 oz (133.6 kg)    GENERAL:alert, no distress and comfortable SKIN: skin color, texture, turgor are normal, no rashes or significant lesions EYES: normal, conjunctiva are pink and non-injected, sclera clear OROPHARYNX:no exudate, no erythema and lips, buccal mucosa, and tongue normal  NECK: supple, thyroid normal size, non-tender, without  nodularity LYMPH:  no palpable lymphadenopathy in the cervical, axillary or inguinal LUNGS: clear to auscultation and percussion with normal breathing effort HEART: regular rate & rhythm and no murmurs and no lower extremity edema ABDOMEN:abdomen soft, non-tender and normal bowel sounds Musculoskeletal:no cyanosis of digits and no clubbing  PSYCH: alert & oriented x 3 with fluent speech NEURO: no focal motor/sensory deficits BREAST: No palpable nodules in breast. No palpable axillary or supraclavicular lymphadenopathy (exam performed in the presence of a chaperone)   LABORATORY DATA:  I have reviewed the data as listed Lab Results  Component Value Date   WBC 4.8 03/05/2022   HGB 11.2 (L) 03/05/2022   HCT 34.3 (L) 03/05/2022   MCV 91.5 03/05/2022   PLT 189 03/05/2022   Lab Results  Component Value Date   NA 139 03/05/2022   K 4.4 03/05/2022   CL 109 03/05/2022   CO2 24 03/05/2022    RADIOGRAPHIC STUDIES: I have personally reviewed the radiological reports and agreed with the findings in the report.  ASSESSMENT AND PLAN:  Ductal carcinoma in situ (DCIS) of right breast This is a 75 year old postmenopausal female patient with right breast DCIS referred to breast [place] for additional recommendations.  Pathology review: I discussed with the patient the difference between DCIS and invasive breast cancer. It is considered a precancerous lesion. DCIS is classified as a Stage 0 breast cancer. It is generally  detected through mammograms as calcifications. We discussed the significance of grades and its impact on prognosis. We also discussed the importance of ER and PR receptors and their implications to adjuvant treatment options. Prognosis of DCIS dependence on grade and degree of comedo necrosis. It is anticipated that if not treated, 20-30% of DCIS can develop into invasive breast cancer.  Recommendation: 1. Breast conserving surgery 2. Followed by adjuvant radiation therapy 3.  Followed by antiestrogen therapy with tamoxifen/aromatase inhibitors based on menopausal status 5 years  Tamoxifen counseling: We discussed the risks and benefits of tamoxifen. These include but not limited to insomnia, hot flashes, mood changes, vaginal dryness, and weight gain. Although rare, serious side effects including endometrial cancer, risk of blood clots were also discussed. We strongly believe that the benefits far outweigh the risks. Patient understands these risks and consented to starting treatment. Planned treatment duration is 5 years.  Aromatase inhibitors counseling: We have discussed the mechanism of action of aromatase inhibitors today.  We have discussed adverse effects including but not limited to menopausal symptoms, increased risk of osteoporosis and fractures, cardiovascular events, arthralgias and myalgias.  We do believe that the benefits far outweigh the risks.  Plan treatment duration of 5 years.  Patient would like to think about the options for antiestrogen therapy.  For now she plans to proceed with surgery and return to clinic after surgery to discuss adjuvant options.  All their questions were answered to the best of my knowledge.  Thank you for consulting [name] in the care of this patient.  Please do not hesitate to contact us with any additional questions or concerns.  Total time spent: 60 minutes including history, physical, review of records, counseling and coordination of care All questions were answered. The patient knows to call the clinic with any problems, questions or concerns.    Benay Pike, MD 03/05/22

## 2022-03-05 NOTE — Assessment & Plan Note (Addendum)
This is a 76 year old postmenopausal female patient with right breast DCIS referred to breast [place] for additional recommendations.  Pathology review: I discussed with the patient the difference between DCIS and invasive breast cancer. It is considered a precancerous lesion. DCIS is classified as a Stage 0 breast cancer. It is generally detected through mammograms as calcifications. We discussed the significance of grades and its impact on prognosis. We also discussed the importance of ER and PR receptors and their implications to adjuvant treatment options. Prognosis of DCIS dependence on grade and degree of comedo necrosis. It is anticipated that if not treated, 20-30% of DCIS can develop into invasive breast cancer.  Recommendation: 1. Breast conserving surgery 2. Followed by adjuvant radiation therapy 3. Followed by antiestrogen therapy with tamoxifen/aromatase inhibitors based on menopausal status 5 years  Tamoxifen counseling: We discussed the risks and benefits of tamoxifen. These include but not limited to insomnia, hot flashes, mood changes, vaginal dryness, and weight gain. Although rare, serious side effects including endometrial cancer, risk of blood clots were also discussed. We strongly believe that the benefits far outweigh the risks. Patient understands these risks and consented to starting treatment. Planned treatment duration is 5 years.  Aromatase inhibitors counseling: We have discussed the mechanism of action of aromatase inhibitors today.  We have discussed adverse effects including but not limited to menopausal symptoms, increased risk of osteoporosis and fractures, cardiovascular events, arthralgias and myalgias.  We do believe that the benefits far outweigh the risks.  Plan treatment duration of 5 years.  Patient would like to think about the options for antiestrogen therapy.  For now she plans to proceed with surgery and return to clinic after surgery to discuss adjuvant  options.  All their questions were answered to the best of my knowledge.  Thank you for consulting [name] in the care of this patient.  Please do not hesitate to contact us with any additional questions or concerns.

## 2022-03-05 NOTE — Progress Notes (Unsigned)
REFERRING PROVIDER: Benay Pike, MD [place],  [place] [address]  PRIMARY PROVIDER:  Erskine Emery, MD  PRIMARY REASON FOR VISIT:  1. Ductal carcinoma in situ (DCIS) of right breast   2. Family history of breast cancer     HISTORY OF PRESENT ILLNESS:   Erin Coffey, a 76 y.o. female, was seen for a  cancer genetics consultation during the breast multidisciplinary clinic at the request of Dr. Chryl Heck due to a personal and family history of breast cancer.  Erin Coffey presents to clinic today to discuss the possibility of a hereditary predisposition to cancer, to discuss genetic testing, and to further clarify her future cancer risks, as well as potential cancer risks for family members.   In July 2023, at the age of 24, Erin Coffey was diagnosed with ductal carcinoma in situ of the right breast (ER+/PR+). The preliminary treatment plan includes breast conserving surgery, adjuvant radiation, and anti-estrogens. She also has a history of a pituitary adenoma.    CANCER HISTORY:  Oncology History  Ductal carcinoma in situ (DCIS) of right breast  01/20/2022 Mammogram   Digital screening mammogram with incomplete evaluation.  Right breast focal asymmetry indeterminate. Diagnostic mammogram and ultrasound showed no correlate.  Stereotactic biopsy was recommended   02/14/2022 Pathology Results   Pathology from the right breast needle core biopsy showed DCIS, low to intermediate nuclear grade, cribriform and micropapillary types without necrosis and foci suspicious for microinvasion.  Negative for microcalcifications.  DCIS measured 2.2 mm in greatest extent   03/03/2022 Initial Diagnosis   Ductal carcinoma in situ (DCIS) of right breast      Past Medical History:  Diagnosis Date   Allergy    Anemia, iron deficiency    ANXIETY, SITUATIONAL 04/06/2009   Qualifier: Diagnosis of  By: [name]  MD, Stephanie     Arthritis    Back pain, thoracic 12/20/2012   Carpal tunnel  syndrome 12/07/2014   Cholelithiasis 06/25/2012   Chronic kidney disease    DM type 2 goal A1C below 7.5 06/05/2008   Qualifier: Diagnosis of  By: [name]  MD, Colletta [place]     Family history of breast cancer 03/05/2022   GASTROESOPHAGEAL REFLUX, NO ESOPHAGITIS 10/08/2006   Qualifier: Diagnosis of  By: Griffin Dakin, unspecified 10/21/2007   Qualifier: Diagnosis of  By: [name] MD, [name]     History of DVT (deep vein thrombosis) 05/05/2013   on Eliquis   HYPERLIPIDEMIA 08/08/2008   Qualifier: Diagnosis of  By: [name]  MD, Stephanie     HYPERTENSION, BENIGN ESSENTIAL 11/27/2006   Qualifier: Diagnosis of  ByHoy Morn MD, [name]     Mild coronary artery disease 10/2019   Coronary CT Angiogram: Coronary calcium score 46.Normal coronary origin.  Left dominance with tortuous coronary arteries.  Nondominant RCA - prox < 25% followed by ectasia (dilated to 5.5 mm) mixed plaque in distal vessel.  LM- normal . LAD: minimal mixed plaque in ostial & proximal (<25%) with brief IM bridging in mLAD. Mild mid-distal (25-49%), RI - prox <25%, Large LCx- minimal (<25%) mid-dista   Mild coronary artery disease    Personal history of colonic polyps 10/24/2009   Qualifier: Diagnosis of  By: [name] MD, Sandy Salaam    [place] 08/08/2009   Qualifier: Diagnosis of  By: [name], [name]     Pituitary adenoma [facility])    Polymyalgia rheumatica ([place]) 10/02/2009   Qualifier: Diagnosis of  By: [name] MD, Apolonio Schneiders  Pulmonary embolism ([place]) - On long Term anticoagulation (Z79.01) 04/28/2011   Converted from warfarin to Eliquis; on long-term anticoagulation.   Thyroid disease    Trigger ring finger of left hand 11/02/2014    Past Surgical History:  Procedure Laterality Date   ABDOMINAL HYSTERECTOMY     CARDIAC CATHETERIZATION  04/29/11   (Dr. Lia Foyer [facility]): LM - normal. LAD with 1 major Diag - normal, bifurcating Ramus - normal, Large (6-7 mm) Dominant LCx with 1 OM, 2 LPL branches & LPDA - non significant disease;  small non-dominant RCA.     CARDIAC EVENT MONITOR  09/2019   Mostly sinus rhythm, average rate 60 bpm.  Range 45 to 190 bpm.  No critical events indicating any arrhythmias or pauses.  For patient responses noted PVCs.  Less than 1% PVCs noted.    CARPAL TUNNEL RELEASE Right 1996   COLONOSCOPY     KNEE ARTHROSCOPY  Bilateral   TRANSTHORACIC ECHOCARDIOGRAM  09/2019   Normal EF - 60 to 65%.  Mild LVH.  GR 1 DD.  Normal RV.  Trivial MR.  Normal IVC.  Normal PA pressures.   TUBAL LIGATION     UPPER GASTROINTESTINAL ENDOSCOPY      FAMILY HISTORY:  We obtained a detailed, 4-generation family history.  Significant diagnoses are listed below: Family History  Problem Relation Age of Onset   Leukemia Sister 19   Breast cancer Cousin        dx 44s; maternal female cousin   ***  Erin Coffey is unaware of previous family history of genetic testing for hereditary cancer risks. There is no reported Ashkenazi Jewish ancestry. There is no known consanguinity.  GENETIC COUNSELING ASSESSMENT: Erin Coffey is a 76 y.o. female with a personal and family history of breast cancer which is somewhat suggestive of a hereditary cancer syndrome and predisposition to cancer given the presence of related cancers at young ages in her maternal family. We, therefore, discussed and recommended the following at today's visit.   DISCUSSION: We discussed that 5 - 10% of cancer is hereditary.  Most cases of hereditary breast cancer are associated with mutations in BRCA1/2.  There are other genes that can be associated with hereditary breast cancer syndromes.  We discussed that testing is beneficial for several reasons including knowing how to follow individuals for their cancer risks, identifying whether potential treatment options would be beneficial and understanding if other family members could be at risk for cancer and allowing them to undergo genetic testing.   We reviewed the characteristics, features and inheritance  patterns of hereditary cancer syndromes. We also discussed genetic testing, including the appropriate family members to test, the process of testing, insurance coverage and turn-around-time for results. We discussed the implications of a negative, positive, carrier and/or variant of uncertain significant result. We recommended Erin Coffey pursue genetic testing for a panel that includes genes associated with breast cancer.   Based on Erin Coffey's personal history of cancer, she meets medical criteria for genetic testing. Despite that she meets criteria, she may still have an out of pocket cost.   PLAN:  Erin Coffey did not wish to pursue genetic testing at today's visit.  After discussion with her daughter, she only wants to proceed with testing if it will directly impact the treatment of her current DCIS. We remain available to coordinate genetic testing at any time in the future. We, therefore, recommend Erin Coffey continue to follow the cancer screening guidelines given by her  primary healthcare provider.  We recommended her maternal cousin, who was diagnosed with breast cancer in her 108s have genetic counseling and testing. Erin Coffey canl let us know if we can be of any assistance in coordinating genetic counseling and/or testing for this family member.  Lastly, we encouraged Erin Coffey to remain in contact with cancer genetics annually so that we can continuously update the family history and inform her of any changes in cancer genetics and testing that may be of benefit for this family.   Erin Coffey questions were answered to her satisfaction today. Our contact information was provided should additional questions or concerns arise. Thank you for the referral and allowing [name] to share in the care of your patient.   Erin Coffey, [place], [facility] Genetic Counselor [email or web address] (P) [phone]  The patient was seen for a total of 15 minutes in face-to-face genetic counseling.  The  patient was accompanied by her daughter, Erin Coffey.  Drs. Lindi Adie and/or Burr Medico were available to discuss this case as needed.   _______________________________________________________________________ For Office Staff:  Number of people involved in session: 2 Was an Intern/ student involved with case: no

## 2022-03-06 ENCOUNTER — Encounter: Payer: Self-pay | Admitting: *Deleted

## 2022-03-06 ENCOUNTER — Telehealth: Payer: Self-pay | Admitting: Hematology and Oncology

## 2022-03-06 DIAGNOSIS — D0511 Intraductal carcinoma in situ of right breast: Secondary | ICD-10-CM

## 2022-03-06 NOTE — Telephone Encounter (Signed)
Contacted patient to scheduled appointments. Left message with appointment details and a call back number if patient had any questions or could not accommodate the time we provided.   

## 2022-03-07 ENCOUNTER — Telehealth: Payer: Self-pay | Admitting: Hematology and Oncology

## 2022-03-07 NOTE — Telephone Encounter (Signed)
.  Called patient to schedule appointment per 7/27 inbasket, patient is aware of date and time.   

## 2022-03-11 ENCOUNTER — Inpatient Hospital Stay
Admission: RE | Admit: 2022-03-11 | Discharge: 2022-03-11 | Disposition: A | Discharge status: Home / Self Care | Payer: Self-pay | Source: Ambulatory Visit | Attending: Radiation Oncology | Admitting: Radiation Oncology

## 2022-03-11 ENCOUNTER — Other Ambulatory Visit: Payer: Self-pay | Admitting: Radiation Oncology

## 2022-03-11 ENCOUNTER — Ambulatory Visit
Admission: RE | Admit: 2022-03-11 | Discharge: 2022-03-11 | Disposition: A | Discharge status: Home / Self Care | Payer: Self-pay | Source: Ambulatory Visit | Attending: Radiation Oncology | Admitting: Radiation Oncology

## 2022-03-11 DIAGNOSIS — D0511 Intraductal carcinoma in situ of right breast: Secondary | ICD-10-CM

## 2022-03-13 ENCOUNTER — Telehealth: Payer: Self-pay | Admitting: *Deleted

## 2022-03-13 ENCOUNTER — Encounter: Payer: Self-pay | Admitting: *Deleted

## 2022-03-13 NOTE — Telephone Encounter (Signed)
Spoke with patient to follow up from [facility] 7/26 and assess navigation needs. Patient denies any questions or concerns at this time. Encouraged her to call should anything arise. Patient verbalized understanding.

## 2022-03-17 DIAGNOSIS — H524 Presbyopia: Secondary | ICD-10-CM | POA: Diagnosis not present

## 2022-03-17 DIAGNOSIS — H53143 Visual discomfort, bilateral: Secondary | ICD-10-CM | POA: Diagnosis not present

## 2022-03-18 ENCOUNTER — Telehealth: Payer: Self-pay | Admitting: *Deleted

## 2022-03-18 ENCOUNTER — Other Ambulatory Visit: Payer: Self-pay

## 2022-03-18 ENCOUNTER — Encounter (HOSPITAL_BASED_OUTPATIENT_CLINIC_OR_DEPARTMENT_OTHER): Payer: Self-pay | Admitting: Surgery

## 2022-03-18 NOTE — Progress Notes (Signed)
Spoke to [place] at [place] concerning need for Eliquis hold orders.

## 2022-03-18 NOTE — Telephone Encounter (Signed)
Pt is in office today with grandson and wanted to let PCP know that she is having surgery on her breast on the 15th and wanted to see if she needs to do anything different as far as taking her medications prior to this surgery.  Her cell number is [phone]. Please let her know what she should do. Jerrion Tabbert Zimmerman Rumple, CMA

## 2022-03-18 NOTE — Progress Notes (Signed)

## 2022-03-19 NOTE — Telephone Encounter (Signed)
Erskine Emery, MD  You 23 hours ago (2:32 PM)    Can I squeeze her in for a virtual visit this week to go through some history and medications. The earlier the better.

## 2022-03-20 NOTE — Progress Notes (Signed)
Spoke with Abigail Butts in the nursing office at [facility]. She is aware that we need Eliquis hold orders for patient.

## 2022-03-20 NOTE — Telephone Encounter (Signed)
Pt is scheduled in your ATC clinic tomorrow 8/11 at 2:20 pm.  It is set as MyChart but she said she is not very good with her phone and it would probably be better to just call her for the appointment. Erin Coffey, CMA

## 2022-03-21 ENCOUNTER — Telehealth (INDEPENDENT_AMBULATORY_CARE_PROVIDER_SITE_OTHER): Payer: Medicare Other | Admitting: Student

## 2022-03-21 ENCOUNTER — Telehealth: Payer: Self-pay | Admitting: Cardiology

## 2022-03-21 DIAGNOSIS — E119 Type 2 diabetes mellitus without complications: Secondary | ICD-10-CM

## 2022-03-21 MED ORDER — ONETOUCH VERIO VI STRP
ORAL_STRIP | 12 refills | Status: DC
Start: 2022-03-21 — End: 2024-02-25

## 2022-03-21 NOTE — Telephone Encounter (Signed)
Pt calling to F/U on PCP sending an email to Dr. Ellyn Hack regarding an upcoming procedure that is scheduled for 03/25/22. Pt would like a callback regarding this matter. Please advise

## 2022-03-21 NOTE — Patient Instructions (Signed)
It was great to see you today! Thank you for choosing [facility] for your primary care. Erin Coffey was seen for presurgical clearance.  Today we addressed: Medication Changes prior to surgery Recommendations for medications prior to surgery  Last dose of apixaban: 2 days before surgery (skip 2 doses).  Patient has history of PE, failing warfarin treatment with active cancer. Hold Fosamax day of surgery Hold ACE and ARB morning of surgery Can continue statin through surgery Take metoprolol after surgery but can continue this until day of surgery Can continue omeprazole through surgery  If you haven't already, sign up for My Chart to have easy access to your labs results, and communication with your primary care physician.  We are checking some labs today. If they are abnormal, I will call you. If they are normal, I will send you a MyChart message (if it is active) or a letter in the mail. If you do not hear about your labs in the next 2 weeks, please call the office.   You should return to our clinic No follow-ups on file.  I recommend that you always bring your medications to each appointment as this makes it easy to ensure you are on the correct medications and helps [name] not miss refills when you need them.  Please arrive 15 minutes before your appointment to ensure smooth check in process.  We appreciate your efforts in making this happen.  Please call the clinic at [phone] if your symptoms worsen or you have any concerns.  Thank you for allowing me to participate in your care, Erskine Emery, MD 03/21/2022, 11:51 AM PGY-2, [facility]

## 2022-03-21 NOTE — Telephone Encounter (Signed)
Returned the call to the patient. She is having surgery on 8/15. She stated that her PCP had reached out to Dr. Ellyn Hack concerning this. She was not sure if she needed cardiac clearance for this procedure. She has been advised that we have not received anything pertaining to this and Dr. Ellyn Hack is currently out of office through 8/24.

## 2022-03-21 NOTE — Progress Notes (Signed)
Virtual Visit via Telephone Note  I connected with Erin Coffey on 03/21/22 at  2:20 PM EDT by telephone and verified that I am speaking with the correct person using two identifiers.  Virtual visit scheduled for patient as she is due for surgery on 03/25/2022 for right breast seed lumpectomy with Dr. Brantley Stage.  History of ductal carcinoma in situ of right breast ER +/PR + found on 07/23. Seen by oncology for further recommendations  The patient can walk several city blocks and or up 2 flights of stairs without difficulty.   She has pain in her back that prevents prolonged walks   Prior surgeries: hysterectomy, knee surgeries-arthroscopic  Difficulty with surgical procedures in the past: None   History of difficulty with anesthesia: None  History of venous thromboembolic disease: Yes -- see below  History of easy bleeding with procedures: No Family history of venous thromboembolic disease: Not known  Family history of bleeding diathesis: No  Vitals: 125/57 97%  ROS:  Denies chest pain, dyspnea on exertion, chronic cough, family history of venous thromboembolism, personal or family history of easy bleeding or bruising, or personal history or family history of difficulty with anesthesia.  I have reviewed the patient's medical, surgical, family, and social history as appropriate.   Patient is a class II RCRI risk for a moderate risk surgery.  Patient needs to be deemed optimized by cardiology as she has history of mild CAD on coronary CTA prior to surgery.  In addition, I make the following recommendations for further evaluation and medication adjustments prior to their planned procedure:  Recommendations for medications prior to surgery  Last dose of apixaban: 2 days before surgery (skip 2 doses).  Patient has history of PE, failing warfarin treatment with hypercoagulable state with active cancer. Hold Fosamax day of surgery Hold ACE and ARB morning of surgery Can continue statin  through surgery Take metoprolol after surgery but can continue this until day of surgery Can continue omeprazole through surgery Would hold baclofen and tramadol day of surgery as can increase delirium risk   I have contacted patient's cardiologist to inform him of upcoming surgery and need of cardiology optimization.   Location: Patient: Erin Coffey  Provider: Erskine Emery    I discussed the limitations, risks, security and privacy concerns of performing an evaluation and management service by telephone and the availability of in person appointments. I also discussed with the patient that there may be a patient responsible charge related to this service. The patient expressed understanding and agreed to proceed.  I discussed the assessment and treatment plan with the patient. The patient was provided an opportunity to ask questions and all were answered. The patient agreed with the plan and demonstrated an understanding of the instructions.   The patient was advised to call back or seek an in-person evaluation if the symptoms worsen or if the condition fails to improve as anticipated.  I provided 10 minutes of non-face-to-face time during this encounter.   Erskine Emery, MD

## 2022-03-24 DIAGNOSIS — C50911 Malignant neoplasm of unspecified site of right female breast: Secondary | ICD-10-CM | POA: Diagnosis not present

## 2022-03-24 DIAGNOSIS — N1832 Chronic kidney disease, stage 3b: Secondary | ICD-10-CM | POA: Diagnosis not present

## 2022-03-24 NOTE — Progress Notes (Signed)
Chart reviewed with Dr. Gloris Manchester, anesthesiologist, including cardiac history and most recent family medicine visit. Per Dr. Gloris Manchester, patient does not need cardiac clearance in order to proceed with surgery tomorrow. Patient should follow PCP instructions regarding holding blood thinners.

## 2022-03-24 NOTE — Telephone Encounter (Signed)
Per epic note, patient does not need cardiac clearance.  Chart reviewed with Dr. Gloris Manchester, anesthesiologist, including cardiac history and most recent family medicine visit. Per Dr. Gloris Manchester, patient does not need cardiac clearance in order to proceed with surgery tomorrow. Patient should follow PCP instructions regarding holding blood thinners.

## 2022-03-25 ENCOUNTER — Encounter (HOSPITAL_BASED_OUTPATIENT_CLINIC_OR_DEPARTMENT_OTHER)
Admission: RE | Disposition: A | Discharge status: Home / Self Care | Payer: Self-pay | Source: Home / Self Care | Attending: Surgery

## 2022-03-25 ENCOUNTER — Encounter (HOSPITAL_BASED_OUTPATIENT_CLINIC_OR_DEPARTMENT_OTHER): Payer: Self-pay | Admitting: Surgery

## 2022-03-25 ENCOUNTER — Other Ambulatory Visit: Payer: Self-pay

## 2022-03-25 ENCOUNTER — Ambulatory Visit (HOSPITAL_BASED_OUTPATIENT_CLINIC_OR_DEPARTMENT_OTHER): Payer: Medicare Other | Admitting: Anesthesiology

## 2022-03-25 ENCOUNTER — Ambulatory Visit (HOSPITAL_BASED_OUTPATIENT_CLINIC_OR_DEPARTMENT_OTHER)
Admission: RE | Admit: 2022-03-25 | Discharge: 2022-03-25 | Disposition: A | Discharge status: Home / Self Care | Payer: Medicare Other | Attending: Surgery | Admitting: Surgery

## 2022-03-25 DIAGNOSIS — M810 Age-related osteoporosis without current pathological fracture: Secondary | ICD-10-CM | POA: Diagnosis not present

## 2022-03-25 DIAGNOSIS — Z170 Estrogen receptor positive status [ER+]: Secondary | ICD-10-CM | POA: Diagnosis not present

## 2022-03-25 DIAGNOSIS — D0511 Intraductal carcinoma in situ of right breast: Secondary | ICD-10-CM

## 2022-03-25 DIAGNOSIS — N6081 Other benign mammary dysplasias of right breast: Secondary | ICD-10-CM | POA: Diagnosis not present

## 2022-03-25 DIAGNOSIS — Z7984 Long term (current) use of oral hypoglycemic drugs: Secondary | ICD-10-CM | POA: Diagnosis not present

## 2022-03-25 DIAGNOSIS — I10 Essential (primary) hypertension: Secondary | ICD-10-CM

## 2022-03-25 DIAGNOSIS — Z7901 Long term (current) use of anticoagulants: Secondary | ICD-10-CM | POA: Insufficient documentation

## 2022-03-25 DIAGNOSIS — C50911 Malignant neoplasm of unspecified site of right female breast: Secondary | ICD-10-CM | POA: Insufficient documentation

## 2022-03-25 DIAGNOSIS — I2510 Atherosclerotic heart disease of native coronary artery without angina pectoris: Secondary | ICD-10-CM | POA: Insufficient documentation

## 2022-03-25 DIAGNOSIS — K219 Gastro-esophageal reflux disease without esophagitis: Secondary | ICD-10-CM | POA: Insufficient documentation

## 2022-03-25 DIAGNOSIS — E1142 Type 2 diabetes mellitus with diabetic polyneuropathy: Secondary | ICD-10-CM | POA: Diagnosis not present

## 2022-03-25 DIAGNOSIS — E1122 Type 2 diabetes mellitus with diabetic chronic kidney disease: Secondary | ICD-10-CM | POA: Insufficient documentation

## 2022-03-25 DIAGNOSIS — N189 Chronic kidney disease, unspecified: Secondary | ICD-10-CM | POA: Diagnosis not present

## 2022-03-25 DIAGNOSIS — N6011 Diffuse cystic mastopathy of right breast: Secondary | ICD-10-CM | POA: Diagnosis not present

## 2022-03-25 DIAGNOSIS — N289 Disorder of kidney and ureter, unspecified: Secondary | ICD-10-CM

## 2022-03-25 DIAGNOSIS — Z86718 Personal history of other venous thrombosis and embolism: Secondary | ICD-10-CM | POA: Insufficient documentation

## 2022-03-25 DIAGNOSIS — E785 Hyperlipidemia, unspecified: Secondary | ICD-10-CM | POA: Diagnosis not present

## 2022-03-25 DIAGNOSIS — I129 Hypertensive chronic kidney disease with stage 1 through stage 4 chronic kidney disease, or unspecified chronic kidney disease: Secondary | ICD-10-CM | POA: Insufficient documentation

## 2022-03-25 HISTORY — PX: BREAST LUMPECTOMY WITH RADIOACTIVE SEED LOCALIZATION: SHX6424

## 2022-03-25 LAB — GLUCOSE, CAPILLARY
Glucose-Capillary: 99 mg/dL (ref 70–99)
Glucose-Capillary: 92 mg/dL (ref 70–99)

## 2022-03-25 SURGERY — BREAST LUMPECTOMY WITH RADIOACTIVE SEED LOCALIZATION
Anesthesia: General | Site: Breast | Laterality: Right

## 2022-03-25 MED ORDER — OXYCODONE HCL 5 MG/5ML PO SOLN: 5.0000 mg | Freq: Once | ORAL | Status: DC | PRN

## 2022-03-25 MED ORDER — CEFAZOLIN IN SODIUM CHLORIDE 3-0.9 GM/100ML-% IV SOLN
3.0000 g | INTRAVENOUS | Status: AC
  Administered 2022-03-25: 3 g via INTRAVENOUS

## 2022-03-25 MED ORDER — CHLORHEXIDINE GLUCONATE CLOTH 2 % EX PADS: 6.0000 | MEDICATED_PAD | Freq: Once | CUTANEOUS | Status: DC

## 2022-03-25 MED ORDER — OXYCODONE HCL 5 MG PO TABS: 5.0000 mg | ORAL_TABLET | Freq: Four times a day (QID) | ORAL | 0 refills | Status: DC | PRN

## 2022-03-25 MED ORDER — DEXAMETHASONE SODIUM PHOSPHATE 4 MG/ML IJ SOLN
INTRAMUSCULAR | Status: DC | PRN
  Administered 2022-03-25: 4 mg via INTRAVENOUS

## 2022-03-25 MED ORDER — OXYCODONE HCL 5 MG PO TABS: 5.0000 mg | ORAL_TABLET | Freq: Once | ORAL | Status: DC | PRN

## 2022-03-25 MED ORDER — LIDOCAINE HCL (CARDIAC) PF 100 MG/5ML IV SOSY
PREFILLED_SYRINGE | INTRAVENOUS | Status: DC | PRN
  Administered 2022-03-25: 60 mg via INTRAVENOUS

## 2022-03-25 MED ORDER — FENTANYL CITRATE (PF) 100 MCG/2ML IJ SOLN
INTRAMUSCULAR | Status: AC
  Filled 2022-03-25: qty 2

## 2022-03-25 MED ORDER — LACTATED RINGERS IV SOLN: INTRAVENOUS | Status: DC

## 2022-03-25 MED ORDER — PROPOFOL 10 MG/ML IV BOLUS
INTRAVENOUS | Status: DC | PRN
  Administered 2022-03-25: 150 mg via INTRAVENOUS

## 2022-03-25 MED ORDER — CEFAZOLIN IN SODIUM CHLORIDE 3-0.9 GM/100ML-% IV SOLN
INTRAVENOUS | Status: AC
  Filled 2022-03-25: qty 100

## 2022-03-25 MED ORDER — FENTANYL CITRATE (PF) 100 MCG/2ML IJ SOLN
25.0000 ug | INTRAMUSCULAR | Status: DC | PRN
  Administered 2022-03-25: 25 ug via INTRAVENOUS

## 2022-03-25 MED ORDER — PROPOFOL 500 MG/50ML IV EMUL
INTRAVENOUS | Status: DC | PRN
  Administered 2022-03-25: 25 ug/kg/min via INTRAVENOUS

## 2022-03-25 MED ORDER — SODIUM CHLORIDE 0.9 % IV SOLN
INTRAVENOUS | Status: DC | PRN
  Administered 2022-03-25: 500 mL

## 2022-03-25 MED ORDER — ONDANSETRON HCL 4 MG/2ML IJ SOLN: 4.0000 mg | Freq: Once | INTRAMUSCULAR | Status: DC | PRN

## 2022-03-25 MED ORDER — DEXMEDETOMIDINE HCL IN NACL 80 MCG/20ML IV SOLN
INTRAVENOUS | Status: AC
  Filled 2022-03-25: qty 20

## 2022-03-25 MED ORDER — ONDANSETRON HCL 4 MG/2ML IJ SOLN
INTRAMUSCULAR | Status: DC | PRN
  Administered 2022-03-25: 4 mg via INTRAVENOUS

## 2022-03-25 MED ORDER — FENTANYL CITRATE (PF) 100 MCG/2ML IJ SOLN
INTRAMUSCULAR | Status: DC | PRN
  Administered 2022-03-25: 25 ug via INTRAVENOUS
  Administered 2022-03-25: 50 ug via INTRAVENOUS
  Administered 2022-03-25: 25 ug via INTRAVENOUS

## 2022-03-25 MED ORDER — EPHEDRINE SULFATE (PRESSORS) 50 MG/ML IJ SOLN
INTRAMUSCULAR | Status: DC | PRN
  Administered 2022-03-25: 10 mg via INTRAVENOUS

## 2022-03-25 MED ORDER — GLYCOPYRROLATE 0.2 MG/ML IJ SOLN
INTRAMUSCULAR | Status: DC | PRN
  Administered 2022-03-25: .1 mg via INTRAVENOUS

## 2022-03-25 MED ORDER — BUPIVACAINE-EPINEPHRINE (PF) 0.25% -1:200000 IJ SOLN
INTRAMUSCULAR | Status: DC | PRN
  Administered 2022-03-25: 20 mL

## 2022-03-25 SURGICAL SUPPLY — 52 items
ADH SKN CLS APL DERMABOND .7 (GAUZE/BANDAGES/DRESSINGS) ×1
APL PRP STRL LF DISP 70% ISPRP (MISCELLANEOUS) ×1
APPLIER CLIP 9.375 MED OPEN (MISCELLANEOUS) ×2
APR CLP MED 9.3 20 MLT OPN (MISCELLANEOUS) ×1
BINDER BREAST 3XL (GAUZE/BANDAGES/DRESSINGS) ×1 IMPLANT
BINDER BREAST LRG (GAUZE/BANDAGES/DRESSINGS) IMPLANT
BINDER BREAST MEDIUM (GAUZE/BANDAGES/DRESSINGS) IMPLANT
BINDER BREAST XLRG (GAUZE/BANDAGES/DRESSINGS) IMPLANT
BINDER BREAST XXLRG (GAUZE/BANDAGES/DRESSINGS) IMPLANT
BLADE SURG 15 STRL LF DISP TIS (BLADE) ×1 IMPLANT
BLADE SURG 15 STRL SS (BLADE) ×2
CANISTER SUC SOCK COL 7IN (MISCELLANEOUS) IMPLANT
CANISTER SUCT 1200ML W/VALVE (MISCELLANEOUS) ×1 IMPLANT
CHLORAPREP W/TINT 26 (MISCELLANEOUS) ×2 IMPLANT
CLIP APPLIE 9.375 MED OPEN (MISCELLANEOUS) IMPLANT
COVER BACK TABLE 60X90IN (DRAPES) ×2 IMPLANT
COVER MAYO STAND STRL (DRAPES) ×2 IMPLANT
COVER PROBE W GEL 5X96 (DRAPES) ×2 IMPLANT
DERMABOND ADVANCED (GAUZE/BANDAGES/DRESSINGS) ×1
DERMABOND ADVANCED .7 DNX12 (GAUZE/BANDAGES/DRESSINGS) ×1 IMPLANT
DRAPE LAPAROSCOPIC ABDOMINAL (DRAPES) IMPLANT
DRAPE LAPAROTOMY 100X72 PEDS (DRAPES) ×2 IMPLANT
DRAPE UTILITY XL STRL (DRAPES) ×2 IMPLANT
ELECT COATED BLADE 2.86 ST (ELECTRODE) ×2 IMPLANT
ELECT REM PT RETURN 9FT ADLT (ELECTROSURGICAL) ×2
ELECTRODE REM PT RTRN 9FT ADLT (ELECTROSURGICAL) ×1 IMPLANT
GLOVE BIOGEL PI IND STRL 8 (GLOVE) ×1 IMPLANT
GLOVE BIOGEL PI INDICATOR 8 (GLOVE) ×1
GLOVE ECLIPSE 8.0 STRL XLNG CF (GLOVE) ×2 IMPLANT
GOWN STRL REUS W/ TWL LRG LVL3 (GOWN DISPOSABLE) ×2 IMPLANT
GOWN STRL REUS W/ TWL XL LVL3 (GOWN DISPOSABLE) ×1 IMPLANT
GOWN STRL REUS W/TWL LRG LVL3 (GOWN DISPOSABLE) ×2
GOWN STRL REUS W/TWL XL LVL3 (GOWN DISPOSABLE) ×2
HEMOSTAT ARISTA ABSORB 3G PWDR (HEMOSTASIS) IMPLANT
HEMOSTAT SNOW SURGICEL 2X4 (HEMOSTASIS) IMPLANT
KIT MARKER MARGIN INK (KITS) ×2 IMPLANT
NDL HYPO 25X1 1.5 SAFETY (NEEDLE) ×1 IMPLANT
NEEDLE HYPO 25X1 1.5 SAFETY (NEEDLE) ×2 IMPLANT
NS IRRIG 1000ML POUR BTL (IV SOLUTION) ×1 IMPLANT
PACK BASIN DAY SURGERY FS (CUSTOM PROCEDURE TRAY) ×2 IMPLANT
PENCIL SMOKE EVACUATOR (MISCELLANEOUS) ×2 IMPLANT
SLEEVE SCD COMPRESS KNEE MED (STOCKING) ×2 IMPLANT
SPIKE FLUID TRANSFER (MISCELLANEOUS) IMPLANT
SPONGE T-LAP 4X18 ~~LOC~~+RFID (SPONGE) ×2 IMPLANT
SUT MNCRL AB 4-0 PS2 18 (SUTURE) ×2 IMPLANT
SUT SILK 2 0 SH (SUTURE) IMPLANT
SUT VICRYL 3-0 CR8 SH (SUTURE) ×2 IMPLANT
SYR CONTROL 10ML LL (SYRINGE) ×2 IMPLANT
TOWEL GREEN STERILE FF (TOWEL DISPOSABLE) ×2 IMPLANT
TRAY FAXITRON CT DISP (TRAY / TRAY PROCEDURE) ×2 IMPLANT
TUBE CONNECTING 20X1/4 (TUBING) ×1 IMPLANT
YANKAUER SUCT BULB TIP NO VENT (SUCTIONS) ×1 IMPLANT

## 2022-03-25 NOTE — Transfer of Care (Signed)
Immediate Anesthesia Transfer of Care Note  Patient: Erin Coffey  Procedure(s) Performed: RIGHT BREAST SEED LUMPECTOMY (Right: Breast)  Patient Location: PACU  Anesthesia Type:General  Level of Consciousness: awake, alert , oriented and patient cooperative  Airway & Oxygen Therapy: Patient Spontanous Breathing and Patient connected to face mask oxygen  Post-op Assessment: Report given to RN and Post -op Vital signs reviewed and stable  Post vital signs: Reviewed and stable  Last Vitals:  Vitals Value Taken Time  BP    Temp    Pulse 87 03/25/22 1318  Resp    SpO2 99 % 03/25/22 1318  Vitals shown include unvalidated device data.  Last Pain:  Vitals:   03/25/22 1003  TempSrc: Oral  PainSc: 2       Patients Stated Pain Goal: 5 (18/29/93 7169)  Complications: No notable events documented.

## 2022-03-25 NOTE — Anesthesia Preprocedure Evaluation (Addendum)
Anesthesia Evaluation  Patient identified by MRN, date of birth, ID band Patient awake    Reviewed: Allergy & Precautions, NPO status , Patient's Chart, lab work & pertinent test results, reviewed documented beta blocker date and time   Airway Mallampati: II  TM Distance: >3 FB Neck ROM: Full    Dental  (+) Edentulous Upper, Edentulous Lower   Pulmonary PE   Pulmonary exam normal breath sounds clear to auscultation       Cardiovascular hypertension, Pt. on medications and Pt. on home beta blockers + CAD and + DVT  Normal cardiovascular exam Rhythm:Regular Rate:Normal     Neuro/Psych PSYCHIATRIC DISORDERS Anxiety Peripheral neuropathy  Neuromuscular disease    GI/Hepatic Neg liver ROS, GERD  Medicated and Controlled,  Endo/Other  diabetes, Well Controlled, Type 2, Oral Hypoglycemic AgentsMorbid obesityRight Breast neoplasm Hyperlipidemia  Renal/GU Renal InsufficiencyRenal disease  negative genitourinary   Musculoskeletal  (+) Arthritis , Osteoarthritis,  Osteoporosis   Abdominal (+) + obese,   Peds  Hematology  (+) Blood dyscrasia, anemia , Eliquis therapy- last dose 8/13   Anesthesia Other Findings   Reproductive/Obstetrics                            Anesthesia Physical Anesthesia Plan  ASA: 3  Anesthesia Plan: General   Post-op Pain Management: Minimal or no pain anticipated   Induction: Intravenous  PONV Risk Score and Plan: 4 or greater and Treatment may vary due to age or medical condition and Ondansetron  Airway Management Planned: LMA  Additional Equipment: None  Intra-op Plan:   Post-operative Plan: Extubation in OR  Informed Consent: I have reviewed the patients History and Physical, chart, labs and discussed the procedure including the risks, benefits and alternatives for the proposed anesthesia with the patient or authorized representative who has indicated his/her  understanding and acceptance.     Dental advisory given  Plan Discussed with: CRNA and Anesthesiologist  Anesthesia Plan Comments:         Anesthesia Quick Evaluation

## 2022-03-25 NOTE — Interval H&P Note (Signed)
History and Physical Interval Note:  03/25/2022 11:42 AM  Erin Coffey Erin Coffey  has presented today for surgery, with the diagnosis of RIGHT BREAST DUCTAL CARCINOMA IN SITU.  The various methods of treatment have been discussed with the patient and family. After consideration of risks, benefits and other options for treatment, the patient has consented to  Procedure(s): RIGHT BREAST SEED LUMPECTOMY (Right) as a surgical intervention.  The patient's history has been reviewed, patient examined, no change in status, stable for surgery.  I have reviewed the patient's chart and labs.  Questions were answered to the patient's satisfaction.     [place]

## 2022-03-25 NOTE — H&P (Signed)
History of Present Illness: Erin Coffey is a 76 y.o. female who is seen today as an office consultation for evaluation of Breast Cancer .   Patient presents the [facility] for evaluation of right breast cancer. She was found to have an 8 mm nodule central right breast overlapping sites core biopsy proven to be low-grade tumor intermediate grade DCIS ER positive PR positive. No history of breast cancer except for a cousin. No history of breast mass nipple discharge or breast pain bilaterally. She is accompanied by her daughter today.  Review of Systems: A complete review of systems was obtained from the patient. I have reviewed this information and discussed as appropriate with the patient. See HPI as well for other ROS.    Medical History: Past Medical History:  Diagnosis Date  Arthritis  Chronic kidney disease  DVT (deep venous thrombosis) (CMS-HCC)  GERD (gastroesophageal reflux disease)  History of cancer  Hypertension   There is no problem list on file for this patient.  Past Surgical History:  Procedure Laterality Date  CHOLECYSTECTOMY    Allergies  Allergen Reactions  Lisinopril Other (See Comments)   Current Outpatient Medications on File Prior to Visit  Medication Sig Dispense Refill  acetaminophen (TYLENOL) 650 MG ER tablet Take 1,300 mg by mouth every 8 (eight) hours as needed  alendronate (FOSAMAX) 70 MG tablet Take 1 tablet by mouth every 7 (seven) days  amLODIPine (NORVASC) 5 MG tablet Take 1 tablet by mouth at bedtime  apixaban (ELIQUIS) 5 mg tablet Take 1 tablet by mouth 2 (two) times daily  ascorbic acid, vitamin C, (VITAMIN C) 500 MG tablet Take by mouth  atorvastatin (LIPITOR) 40 MG tablet Take 1 tablet by mouth once daily  baclofen (LIORESAL) 10 MG tablet Take by mouth  gabapentin (NEURONTIN) 100 MG capsule Take 1 capsule by mouth 2 (two) times daily  iron polysaccharides (FERREX) 150 mg iron capsule  ketoconazole (NIZORAL) 2 % cream Apply to both feet once  daily for 4 weeks.  losartan (COZAAR) 100 MG tablet Take 100 mg by mouth once daily  metoprolol tartrate (LOPRESSOR) 25 MG tablet Take 1 tablet by mouth 2 (two) times daily  omega-3-dha-epa-fish oil 300-1,000 mg capsule Take by mouth  omeprazole (PRILOSEC) 20 MG DR capsule Take 1 capsule by mouth once daily  traMADoL (ULTRAM) 50 mg tablet Take 1 tablet by mouth every 6 (six) hours as needed   No current facility-administered medications on file prior to visit.   Family History  Problem Relation Age of Onset  High blood pressure (Hypertension) Mother  Coronary Artery Disease (Blocked arteries around heart) Mother  Diabetes Mother  High blood pressure (Hypertension) Father  Coronary Artery Disease (Blocked arteries around heart) Father    Social History   Tobacco Use  Smoking Status Never  Smokeless Tobacco Never    Social History   Socioeconomic History  Marital status: Single  Tobacco Use  Smoking status: Never  Smokeless tobacco: Never  Substance and Sexual Activity  Alcohol use: Never  Drug use: Never   Objective:  There were no vitals filed for this visit.  There is no height or weight on file to calculate BMI.  Physical Exam Cardiovascular:  Rate and Rhythm: Normal rate.  Chest:  Breasts: Right: Normal.  Left: Normal.  Lymphadenopathy:  Upper Body:  Right upper body: No supraclavicular or axillary adenopathy.  Left upper body: No supraclavicular or axillary adenopathy.  Neurological:  General: No focal deficit present.  Mental Status: She is alert.  Psychiatric:  Mood and Affect: Mood normal.  Behavior: Behavior normal.    Labs, Imaging and Diagnostic Testing: ADDITIONAL INFORMATION: PROGNOSTIC INDICATORS Results: IMMUNOHISTOCHEMICAL AND MORPHOMETRIC ANALYSIS PERFORMED MANUALLY Estrogen Receptor: 100%, POSITIVE, STRONG STAINING INTENSITY Progesterone Receptor: 100%, POSITIVE, STRONG STAINING INTENSITY REFERENCE RANGE ESTROGEN RECEPTOR NEGATIVE  0% POSITIVE =>1% REFERENCE RANGE PROGESTERONE RECEPTOR NEGATIVE 0% POSITIVE =>1% All controls stained appropriately Erin Sheller MD Pathologist, Electronic Signature ( Signed 02/18/2022) FINAL DIAGNOSIS Diagnosis Breast, right, needle core biopsy, Mass lower inner quadrant DUCTAL CARCINOMA IN SITU, LOW TO INTERMEDIATE NUCLEAR GRADE, CRIBRIFORM AND MICROPAPILLARY TYPES WITHOUT NECROSIS AND FOCI SUSPICIOUS FOR MICROINVASION (SEE MICROSCOPIC COMMENT) NEGATIVE FOR MICROCALCIFICATIONS DCIS MEASURES 2.2 MM IN GREATEST EXTENT 1 of 3 FINAL for Erin Coffey, Erin Coffey ([id number]) Microscopic Comment Sections show multiple cores of breast with scattered ducts distended by an atypical ductal epithelium and a cribriform and micropapillary pattern composed of cells with variably large round oval hyperchromatic nuclei with inconspicuous nucleoli. Focally these ducts are present within a reactive desmoplastic stroma with with the disc cohesion and scattered small single cells and nests and within the adjacent stroma. These changes are very focal and and is insufficient for myoepithelial immunohistochemistry. Additionally there is abundant admixed blood and blood clot with the cores which contain loosely cohesive sheets and nests of similar atypical cells without associated stroma or ductal lumens. Immunohistochemical stains of for the breast prognostic markers estrogen receptor and progesterone receptor have been ordered and the results will be issued in a subsequent addendum to this report. This case was reviewed by Dr. Thornell Coffey who concurs with the interpretation. Diagnosis called to Erin Coffey at [facility] by Dr. Alric Coffey on 02/17/2022 at 9:35 AM. Erin Chad MD Pathologist, Electronic Signature (Case signed 02/17/2022  [facility] tomography shows an 8 mm mass located centrally right breast core biopsy proven to be low-grade DCIS with questionable microinvasion Assessment and  Plan:   Diagnoses and all orders for this visit:  Ductal carcinoma in situ (DCIS) of right breast    Discussed breast conserving surgery versus mastectomy reconstruction. Discussed the pros and cons of each approach as well as long-term expectations, local regional recurrence and complications of each. She is opted for right breast seed localized lumpectomy. She will be seen by medical and radiation oncology. Pros and cons of breast conserving surgery discussed as well as limitations of bleeding, infection, local regional recurrence rates and the need for reexcision. Discussed cosmesis and overall prognosis as well as quality of life. She like to proceed with right breast seed localized lumpectomy. Discussed the potential microinvasion but given her advanced age I do not feel a sentinel node at this point time is necessary and this could be discussed depending on final pathology.  The procedure has been discussed with the patient. Alternatives to surgery have been discussed with the patient. Risks of surgery include bleeding, Infection, Seroma formation, death, and the need for further surgery. The patient understands and wishes to proceed.   No follow-ups on file.  Kennieth Francois, MD

## 2022-03-25 NOTE — Anesthesia Postprocedure Evaluation (Signed)
Anesthesia Post Note  Patient: Erin Coffey  Procedure(s) Performed: RIGHT BREAST SEED LUMPECTOMY (Right: Breast)     Patient location during evaluation: PACU Anesthesia Type: General Level of consciousness: awake and alert and oriented Pain management: pain level controlled Vital Signs Assessment: post-procedure vital signs reviewed and stable Respiratory status: spontaneous breathing, nonlabored ventilation and respiratory function stable Cardiovascular status: blood pressure returned to baseline and stable Postop Assessment: no apparent nausea or vomiting Anesthetic complications: no   No notable events documented.  Last Vitals:  Vitals:   03/25/22 1345 03/25/22 1400  BP: (!) 159/69 (!) 158/60  Pulse: (!) 59 (!) 56  Resp: 11 10  Temp:    SpO2: 96% 99%    Last Pain:  Vitals:   03/25/22 1400  TempSrc:   PainSc: 2                  Cylan Borum A.

## 2022-03-25 NOTE — Anesthesia Procedure Notes (Signed)
Procedure Name: LMA Insertion Date/Time: 03/25/2022 12:18 PM  Performed by: Janetta Vandoren, Ernesta Amble, CRNAPre-anesthesia Checklist: Patient identified, Emergency Drugs available, Suction available and Patient being monitored Patient Re-evaluated:Patient Re-evaluated prior to induction Oxygen Delivery Method: Circle system utilized Preoxygenation: Pre-oxygenation with 100% oxygen Induction Type: IV induction Ventilation: Mask ventilation without difficulty LMA: LMA inserted LMA Size: 4.0 Number of attempts: 1 Airway Equipment and Method: Bite block Placement Confirmation: positive ETCO2 Tube secured with: Tape Dental Injury: Teeth and Oropharynx as per pre-operative assessment

## 2022-03-25 NOTE — Op Note (Signed)
Preoperative diagnosis: Right breast DCIS upper outer quadrant  Postoperative diagnosis: Same  Procedure: Right breast seed localized lumpectomy  Surgeon: Erroll Luna, MD  Anesthesia: LMA with 0.25% Marcaine with epinephrine  EBL: Minimal  Specimen: Right breast tissue with seed and clip verified by Faxitron plus additional margins.  All tissue was oriented with ink and sent to pathology.  Drains: None  IV fluids: Per anesthesia record  Indications for procedure: The patient is a 76 year old female with a recently diagnosed mammographic abnormality consistent from core biopsy to be DCIS.  She was seen and has chosen breast conserving surgery as an option of treatment.  She was evaluated by medical and radiation oncology.  Risk and benefits of all treatment options were discussed with the patient.  We discussed mastectomy as well.  Long-term outcome data, local regional recurrence, and reexcision were all discussed with the patient.  She opted to undergo right breast seed localized lumpectomy.The procedure has been discussed with the patient. Alternatives to surgery have been discussed with the patient.  Risks of surgery include bleeding,  Infection,  Seroma formation, death,  and the need for further surgery.   The patient understands and wishes to proceed.     Description of procedure: The patient was met in the holding area and questions were answered.  Right breast was marked as correct site and films were available for review.  Of note she underwent seed placement as an outpatient.  She was then brought back to the operating room.  After induction of general esthesia, the right breast was prepped and draped in a sterile fashion and timeout performed.  Proper patient, site and procedure verified.  Neoprobe was used to identify the seed in the right breast upper outer quadrant.  Local anesthetic was infiltrated along the superior border of the nipple areolar complex.  A curvilinear  incision was made.  Dissection was carried down all tissue around the seed and clip were excised with a grossly negative margin.  I opted to excise all margins as well as separate specimens and all tissue was oriented with ink.  The Faxitron revealed the seed and clip to be in the specimen.  All tissue was sent to pathology.  The cavity is irrigated.  Local anesthetic was infiltrated and cautery was used for hemostasis.  After irrigation there is excellent pneumostasis and the cavity was closed with a deep layer 3-0 Vicryl.  4 Monocryl was used to close skin in a subcuticular fashion.  Dermabond was applied.  All counts were found to be correct.  Breast binder placed.  The patient was then awoke extubated taken to recovery in satisfactory condition.

## 2022-03-25 NOTE — Discharge Instructions (Addendum)
[facility] Office Phone Number [phone]  BREAST BIOPSY/ PARTIAL MASTECTOMY: POST OP INSTRUCTIONS  Always review your discharge instruction sheet given to you by the facility where your surgery was performed.  IF YOU HAVE DISABILITY OR FAMILY LEAVE FORMS, YOU MUST BRING THEM TO THE OFFICE FOR PROCESSING.  DO NOT GIVE THEM TO YOUR DOCTOR.  A prescription for pain medication may be given to you upon discharge.  Take your pain medication as prescribed, if needed.  If narcotic pain medicine is not needed, then you may take acetaminophen (Tylenol) or ibuprofen (Advil) as needed. Take your usually prescribed medications unless otherwise directed If you need a refill on your pain medication, please contact your pharmacy.  They will contact our office to request authorization.  Prescriptions will not be filled after 5pm or on week-ends. You should eat very light the first 24 hours after surgery, such as soup, crackers, pudding, etc.  Resume your normal diet the day after surgery. Most patients will experience some swelling and bruising in the breast.  Ice packs and a good support bra will help.  Swelling and bruising can take several days to resolve.  It is common to experience some constipation if taking pain medication after surgery.  Increasing fluid intake and taking a stool softener will usually help or prevent this problem from occurring.  A mild laxative (Milk of Magnesia or Miralax) should be taken according to package directions if there are no bowel movements after 48 hours. Unless discharge instructions indicate otherwise, you may remove your bandages 24-48 hours after surgery, and you may shower at that time.  You may have steri-strips (small skin tapes) in place directly over the incision.  These strips should be left on the skin for 7-10 days.  If your surgeon used skin glue on the incision, you may shower in 24 hours.  The glue will flake off over the next 2-3 weeks.  Any  sutures or staples will be removed at the office during your follow-up visit. ACTIVITIES:  You may resume regular daily activities (gradually increasing) beginning the next day.  Wearing a good support bra or sports bra minimizes pain and swelling.  You may have sexual intercourse when it is comfortable. You may drive when you no longer are taking prescription pain medication, you can comfortably wear a seatbelt, and you can safely maneuver your car and apply brakes. RETURN TO WORK:  ______________________________________________________________________________________ You should see your doctor in the office for a follow-up appointment approximately two weeks after your surgery.  Your doctor's nurse will typically make your follow-up appointment when she calls you with your pathology report.  Expect your pathology report 2-3 business days after your surgery.  You may call to check if you do not hear from us after three days. OTHER INSTRUCTIONS: _______________________________________________________________________________________________ _____________________________________________________________________________________________________________________________________ _____________________________________________________________________________________________________________________________________ _____________________________________________________________________________________________________________________________________  WHEN TO CALL YOUR DOCTOR: Fever over 101.0 Nausea and/or vomiting. Extreme swelling or bruising. Continued bleeding from incision. Increased pain, redness, or drainage from the incision.  The clinic staff is available to answer your questions during regular business hours.  Please don't hesitate to call and ask to speak to one of the nurses for clinical concerns.  If you have a medical emergency, go to the nearest emergency room or call 911.  A surgeon from [facility] is always on call at the hospital.  For further questions, please visit [email or web address]    Post Anesthesia Home Care Instructions  Activity: Get plenty of rest for the remainder of   the day. A responsible individual must stay with you for 24 hours following the procedure.  For the next 24 hours, DO NOT: -Drive a car -Operate machinery -Drink alcoholic beverages -Take any medication unless instructed by your physician -Make any legal decisions or sign important papers.  Meals: Start with liquid foods such as gelatin or soup. Progress to regular foods as tolerated. Avoid greasy, spicy, heavy foods. If nausea and/or vomiting occur, drink only clear liquids until the nausea and/or vomiting subsides. Call your physician if vomiting continues.  Special Instructions/Symptoms: Your throat may feel dry or sore from the anesthesia or the breathing tube placed in your throat during surgery. If this causes discomfort, gargle with warm salt water. The discomfort should disappear within 24 hours.  If you had a scopolamine patch placed behind your ear for the management of post- operative nausea and/or vomiting:  1. The medication in the patch is effective for 72 hours, after which it should be removed.  Wrap patch in a tissue and discard in the trash. Wash hands thoroughly with soap and water. 2. You may remove the patch earlier than 72 hours if you experience unpleasant side effects which may include dry mouth, dizziness or visual disturbances. 3. Avoid touching the patch. Wash your hands with soap and water after contact with the patch.      

## 2022-03-26 ENCOUNTER — Encounter (HOSPITAL_BASED_OUTPATIENT_CLINIC_OR_DEPARTMENT_OTHER): Payer: Self-pay | Admitting: Surgery

## 2022-03-27 LAB — SURGICAL PATHOLOGY

## 2022-03-31 ENCOUNTER — Encounter: Payer: Self-pay | Admitting: *Deleted

## 2022-03-31 ENCOUNTER — Encounter: Payer: Self-pay | Admitting: Surgery

## 2022-04-04 DIAGNOSIS — N1832 Chronic kidney disease, stage 3b: Secondary | ICD-10-CM | POA: Diagnosis not present

## 2022-04-04 DIAGNOSIS — E559 Vitamin D deficiency, unspecified: Secondary | ICD-10-CM | POA: Diagnosis not present

## 2022-04-04 DIAGNOSIS — D631 Anemia in chronic kidney disease: Secondary | ICD-10-CM | POA: Diagnosis not present

## 2022-04-04 DIAGNOSIS — R809 Proteinuria, unspecified: Secondary | ICD-10-CM | POA: Diagnosis not present

## 2022-04-04 DIAGNOSIS — N2581 Secondary hyperparathyroidism of renal origin: Secondary | ICD-10-CM | POA: Diagnosis not present

## 2022-04-04 DIAGNOSIS — I129 Hypertensive chronic kidney disease with stage 1 through stage 4 chronic kidney disease, or unspecified chronic kidney disease: Secondary | ICD-10-CM | POA: Diagnosis not present

## 2022-04-10 ENCOUNTER — Ambulatory Visit: Payer: Medicare Other | Admitting: Hematology and Oncology

## 2022-04-15 DIAGNOSIS — D497 Neoplasm of unspecified behavior of endocrine glands and other parts of nervous system: Secondary | ICD-10-CM | POA: Diagnosis not present

## 2022-04-16 ENCOUNTER — Encounter (HOSPITAL_COMMUNITY): Payer: Self-pay

## 2022-04-17 ENCOUNTER — Encounter: Payer: Self-pay | Admitting: Podiatry

## 2022-04-17 ENCOUNTER — Ambulatory Visit (INDEPENDENT_AMBULATORY_CARE_PROVIDER_SITE_OTHER): Payer: Medicare Other

## 2022-04-17 ENCOUNTER — Ambulatory Visit: Payer: Medicare Other | Admitting: Podiatry

## 2022-04-17 DIAGNOSIS — M7751 Other enthesopathy of right foot: Secondary | ICD-10-CM

## 2022-04-17 DIAGNOSIS — M79671 Pain in right foot: Secondary | ICD-10-CM

## 2022-04-17 MED ORDER — TRIAMCINOLONE ACETONIDE 10 MG/ML IJ SUSP
10.0000 mg | Freq: Once | INTRAMUSCULAR | Status: AC
  Administered 2022-04-17: 10 mg

## 2022-04-18 NOTE — Progress Notes (Signed)
Subjective:   Patient ID: Erin Coffey, female   DOB: 76 y.o.   MRN: [id number]   HPI Patient presents stating that she has had a lot of discomfort around the right second metatarsal joint into the second toe over the last month does not remember injury and states that its been sore and shoe gear are bothersome   ROS      Objective:  Physical Exam  Neuro vascular status intact with inflammation pain around the second MPJ right extending into the digit with most the pain in the metatarsal phalangeal joint     Assessment:  Inflammatory capsulitis second MPJ right with fluid buildup with possible digital involvement     Plan:  H&P x-rays reviewed and went ahead today did sterile prep and injected periarticular around the second MPJ 3 mg dexamethasone Kenalog 5 mg Xylocaine advised on wider shoes and reappoint as needed  X-rays were negative for fracture or indications of arthritic issue

## 2022-04-21 NOTE — Progress Notes (Signed)
Radiation Oncology         [phone] ________________________________  Name: Erin Coffey        MRN: [id number]  Date of Service: 04/22/2022 DOB: 03/31/1946  CC:Erskine Emery, MD  Benay Pike, MD     REFERRING PHYSICIAN: Benay Pike, MD   DIAGNOSIS: The encounter diagnosis was Ductal carcinoma in situ (DCIS) of right breast.   HISTORY OF PRESENT ILLNESS: Erin Coffey is a 76 y.o. female with a history of right breast cancer. The patient was noted to have screening detected calcifications and asymmetry in the lower outer quadrant of the right breast. She proceeded with diagnostic ultrasound on 01/24/22 and this showed no correlate but her axilla was negative for adenopathy. Stereotactic biopsy on 02/14/22 showed a low to intermediate grade DCIS suspicious for microinvasion that was ER/PR positive.   Since her last visit, the patient has undergone right lumpectomy with Dr. Brantley Stage on 03/25/2022.  Final pathology showed intermediate grade DCIS that measured 1.8 cm.  This was noted to abut the posterior margin of the resection but additional tissue in multiple dimensions was all negative for disease.  Fibrocystic changes and normal breast tissue were seen in the specimens.  She is seen to discuss adjuvant radiotherapy.    PREVIOUS RADIATION THERAPY: No   PAST MEDICAL HISTORY:  Past Medical History:  Diagnosis Date   Allergy    Anemia, iron deficiency    ANXIETY, SITUATIONAL 04/06/2009   Qualifier: Diagnosis of  By: [name]  MD, Stephanie     Arthritis    Back pain, thoracic 12/20/2012   Carpal tunnel syndrome 12/07/2014   Cholelithiasis 06/25/2012   Chronic kidney disease    DM type 2 goal A1C below 7.5 06/05/2008   Qualifier: Diagnosis of  By: [name]  MD, Colletta [place]     Family history of breast cancer 03/05/2022   GASTROESOPHAGEAL REFLUX, NO ESOPHAGITIS 10/08/2006   Qualifier: Diagnosis of  By: Griffin Dakin, unspecified 10/21/2007   Qualifier: Diagnosis  of  By: [name] MD, [name]     History of DVT (deep vein thrombosis) 05/05/2013   on Eliquis   HYPERLIPIDEMIA 08/08/2008   Qualifier: Diagnosis of  By: [name]  MD, Stephanie     HYPERTENSION, BENIGN ESSENTIAL 11/27/2006   Qualifier: Diagnosis of  ByHoy Morn MD, [name]     Mild coronary artery disease 10/2019   Coronary CT Angiogram: Coronary calcium score 46.Normal coronary origin.  Left dominance with tortuous coronary arteries.  Nondominant RCA - prox < 25% followed by ectasia (dilated to 5.5 mm) mixed plaque in distal vessel.  LM- normal . LAD: minimal mixed plaque in ostial & proximal (<25%) with brief IM bridging in mLAD. Mild mid-distal (25-49%), RI - prox <25%, Large LCx- minimal (<25%) mid-dista   Mild coronary artery disease    Personal history of colonic polyps 10/24/2009   Qualifier: Diagnosis of  By: [name] MD, Sandy Salaam    [place] 08/08/2009   Qualifier: Diagnosis of  By: [name], [name]     Pituitary adenoma [facility])    Polymyalgia rheumatica ([place]) 10/02/2009   Qualifier: Diagnosis of  By: [name] MD, Apolonio Schneiders     Pulmonary embolism [facility]) - On long Term anticoagulation (Z79.01) 04/28/2011   Converted from warfarin to Eliquis; on long-term anticoagulation.   Thyroid disease    Trigger ring finger of left hand 11/02/2014       PAST SURGICAL HISTORY: Past Surgical History:  Procedure Laterality Date   ABDOMINAL HYSTERECTOMY  BREAST LUMPECTOMY WITH RADIOACTIVE SEED LOCALIZATION Right 03/25/2022   Procedure: RIGHT BREAST SEED LUMPECTOMY;  Surgeon: Erroll Luna, MD;  Location: [place];  Service: General;  Laterality: Right;   CARDIAC CATHETERIZATION  04/29/11   (Dr. Lia Foyer - [place]): LM - normal. LAD with 1 major Diag - normal, bifurcating Ramus - normal, Large (6-7 mm) Dominant LCx with 1 OM, 2 LPL branches & LPDA - non significant disease; small non-dominant RCA.     CARDIAC EVENT MONITOR  09/2019   Mostly sinus rhythm, average rate 60 bpm.  Range 45 to 190  bpm.  No critical events indicating any arrhythmias or pauses.  For patient responses noted PVCs.  Less than 1% PVCs noted.    CARPAL TUNNEL RELEASE Right 1996   COLONOSCOPY     KNEE ARTHROSCOPY  Bilateral   TRANSTHORACIC ECHOCARDIOGRAM  09/2019   Normal EF - 60 to 65%.  Mild LVH.  GR 1 DD.  Normal RV.  Trivial MR.  Normal IVC.  Normal PA pressures.   TUBAL LIGATION     UPPER GASTROINTESTINAL ENDOSCOPY       FAMILY HISTORY:  Family History  Problem Relation Age of Onset   Diabetes Mother    Heart disease Mother    Hypertension Mother    Heart disease Father    Hypertension Father    Diabetes Sister    Heart disease Sister    Leukemia Sister 26   Cirrhosis Sister        alcohol   Kidney disease Sister    Diabetes Sister    Hypertension Brother    Diabetes Brother    Breast cancer Cousin        dx 77s; maternal female cousin   Colon cancer Neg Hx    Pancreatic cancer Neg Hx    Esophageal cancer Neg Hx      SOCIAL HISTORY:  reports that she has never smoked. She has never used smokeless tobacco. She reports that she does not drink alcohol and does not use drugs. The patient is widowed and lives in [place]. She is retired from working in [employer] at [employer] and is active in cooking for community events at her church. She's accompanied by her daughter.***   ALLERGIES: Lisinopril   MEDICATIONS:  Current Outpatient Medications  Medication Sig Dispense Refill   acetaminophen (TYLENOL) 650 MG CR tablet Take 1,300 mg by mouth every 8 (eight) hours as needed for pain.     alendronate (FOSAMAX) 70 MG tablet TAKE 1 TABLET BY MOUTH  WEEKLY WITH 8 OZ OF PLAIN  WATER 30 MINUTES BEFORE  FIRST FOOD, DRINK OR MEDS.  STAY UPRIGHT FOR 30 MINS 12 tablet 3   amLODipine (NORVASC) 5 MG tablet TAKE 1 TABLET BY MOUTH AT  BEDTIME 90 tablet 3   atorvastatin (LIPITOR) 40 MG tablet TAKE 1 TABLET BY MOUTH  DAILY 100 tablet 2   baclofen (LIORESAL) 10 MG tablet Take 1 tablet (10 mg  total) by mouth daily as needed for muscle spasms. 30 each 0   Blood Glucose Monitoring Suppl (ONETOUCH VERIO) w/Device KIT Use as instructed to test sugars once daily.  Dx Code: E11.9 1 kit 0   CHOLECALCIFEROL PO Take 2,200 Units by mouth daily.     ELIQUIS 5 MG TABS tablet TAKE 1 TABLET BY MOUTH  TWICE DAILY 180 tablet 3   fish oil-omega-3 fatty acids 1000 MG capsule Take 1 g by mouth daily.     gabapentin (  NEURONTIN) 100 MG capsule TAKE 1 CAPSULE BY MOUTH TWICE  DAILY 180 capsule 3   glucose blood (ONETOUCH VERIO) test strip Use as instructed to test sugars once daily.  Dx Code: E11.9 100 each 12   iron polysaccharides (NIFEREX) 150 MG capsule      ketoconazole (NIZORAL) 2 % cream Apply to both feet once daily for 4 weeks. 30 g 1   Lancet Devices (ONE TOUCH DELICA LANCING DEV) MISC Use as instructed to test sugars once daily.  Dx Code: E11.9 1 each 0   losartan (COZAAR) 100 MG tablet Take 1 tablet (100 mg total) by mouth daily. 90 tablet 1   metoprolol tartrate (LOPRESSOR) 25 MG tablet TAKE 1 TABLET BY MOUTH  TWICE DAILY 200 tablet 2   omeprazole (PRILOSEC) 20 MG capsule Take 1 capsule (20 mg total) by mouth daily. 30 capsule 0   OneTouch Delica Lancets 09T MISC Use as instructed to test sugars once daily.  Dx Code: E11.9 100 each 12   oxyCODONE (OXY IR/ROXICODONE) 5 MG immediate release tablet Take 1 tablet (5 mg total) by mouth every 6 (six) hours as needed for severe pain. 15 tablet 0   polyethylene glycol powder (GLYCOLAX/MIRALAX) powder Take 17 g by mouth 2 (two) times daily as needed. (Patient taking differently: Take 17 g by mouth daily as needed (constipation).) 3350 g 1   traMADol (ULTRAM) 50 MG tablet TAKE 1 TABLET BY MOUTH EVERY 6 HOURS AS NEEDED 30 tablet 0   vitamin C (ASCORBIC ACID) 500 MG tablet Take 500 mg by mouth daily.     No current facility-administered medications for this visit.     REVIEW OF SYSTEMS: On review of systems, the patient reports that she is doing ***      PHYSICAL EXAM:  Wt Readings from Last 3 Encounters:  03/25/22 293 lb 10.4 oz (133.2 kg)  03/05/22 294 lb 8 oz (133.6 kg)  01/17/22 294 lb 4 oz (133.5 kg)   Temp Readings from Last 3 Encounters:  03/25/22 97.6 F (36.4 C)  03/05/22 97.7 F (36.5 C) (Tympanic)  07/26/21 99.9 F (37.7 C)   BP Readings from Last 3 Encounters:  03/25/22 (!) 147/55  03/05/22 (!) 173/60  01/17/22 (!) 147/57   Pulse Readings from Last 3 Encounters:  03/25/22 61  03/05/22 70  01/17/22 61   *** In general this is a well appearing African American female in no acute distress. She's alert and oriented x4 and appropriate throughout the examination. Cardiopulmonary assessment is negative for acute distress and she exhibits normal effort.  Her right breast reveals a well-healed surgical incision site without erythema, separation, or drainage.    ECOG = ***  0 - Asymptomatic (Fully active, able to carry on all predisease activities without restriction)  1 - Symptomatic but completely ambulatory (Restricted in physically strenuous activity but ambulatory and able to carry out work of a light or sedentary nature. For example, light housework, office work)  2 - Symptomatic, <50% in bed during the day (Ambulatory and capable of all self care but unable to carry out any work activities. Up and about more than 50% of waking hours)  3 - Symptomatic, >50% in bed, but not bedbound (Capable of only limited self-care, confined to bed or chair 50% or more of waking hours)  4 - Bedbound (Completely disabled. Cannot carry on any self-care. Totally confined to bed or chair)  5 - Death   Eustace Pen MM, Creech RH, Tormey DC, et al. (  1982). "Toxicity and response criteria of the [facility] Group". Christmas Oncol. 5 (6): 649-55    LABORATORY DATA:  Lab Results  Component Value Date   WBC 4.8 03/05/2022   HGB 11.2 (L) 03/05/2022   HCT 34.3 (L) 03/05/2022   MCV 91.5 03/05/2022   PLT 189  03/05/2022   Lab Results  Component Value Date   NA 139 03/05/2022   K 4.4 03/05/2022   CL 109 03/05/2022   CO2 24 03/05/2022   Lab Results  Component Value Date   ALT 14 03/05/2022   AST 22 03/05/2022   ALKPHOS 56 03/05/2022   BILITOT 0.6 03/05/2022      RADIOGRAPHY: No results found.     IMPRESSION/PLAN: 1. Intermediate grade, ER/PR positive DCIS of the right breast. Dr. Lisbeth Renshaw has reviewed her final pathology findings and we reviewed the rationale for external radiotherapy to the breast  to reduce risks of local recurrence followed by antiestrogen therapy.  She is doing very well since completing surgery.  ***Dr. Lisbeth Renshaw also discusses cases in which radiation may be optional for favorable cases based on final pathology. We discussed the risks, benefits, short, and long term effects of radiotherapy, as well as the curative intent, and the patient is interested in proceeding.  I reviewed the delivery and logistics of radiotherapy and Dr. Lisbeth Renshaw recommends 4 weeks of radiotherapy to the right breast.  2.  History of pituitary macroadenoma. She will follow along with Dr. Christella Noa in surveillance as she has been asymptomatic.   In a visit lasting ***minutes, greater than 50% of the time was spent face to face reviewing her case, as well as in preparation of, discussing, and coordinating the patient's care.     Carola Rhine, [facility]    **Disclaimer: This note was dictated with voice recognition software. Similar sounding words can inadvertently be transcribed and this note may contain transcription errors which may not have been corrected upon publication of note.**

## 2022-04-22 ENCOUNTER — Encounter: Payer: Self-pay | Admitting: Hematology and Oncology

## 2022-04-22 ENCOUNTER — Other Ambulatory Visit: Payer: Self-pay

## 2022-04-22 ENCOUNTER — Ambulatory Visit
Admission: RE | Admit: 2022-04-22 | Discharge: 2022-04-22 | Disposition: A | Discharge status: Home / Self Care | Payer: Medicare Other | Source: Ambulatory Visit | Attending: Radiation Oncology | Admitting: Radiation Oncology

## 2022-04-22 ENCOUNTER — Encounter: Payer: Self-pay | Admitting: Radiation Oncology

## 2022-04-22 ENCOUNTER — Inpatient Hospital Stay: Payer: Medicare Other | Attending: Hematology and Oncology | Admitting: Hematology and Oncology

## 2022-04-22 VITALS — BP 169/61 | HR 59 | Temp 97.8°F | Resp 20 | Ht 68.0 in | Wt 290.4 lb

## 2022-04-22 DIAGNOSIS — Z8249 Family history of ischemic heart disease and other diseases of the circulatory system: Secondary | ICD-10-CM | POA: Diagnosis not present

## 2022-04-22 DIAGNOSIS — Z806 Family history of leukemia: Secondary | ICD-10-CM | POA: Diagnosis not present

## 2022-04-22 DIAGNOSIS — K219 Gastro-esophageal reflux disease without esophagitis: Secondary | ICD-10-CM | POA: Diagnosis not present

## 2022-04-22 DIAGNOSIS — Z86018 Personal history of other benign neoplasm: Secondary | ICD-10-CM | POA: Insufficient documentation

## 2022-04-22 DIAGNOSIS — Z841 Family history of disorders of kidney and ureter: Secondary | ICD-10-CM | POA: Diagnosis not present

## 2022-04-22 DIAGNOSIS — Z86711 Personal history of pulmonary embolism: Secondary | ICD-10-CM | POA: Insufficient documentation

## 2022-04-22 DIAGNOSIS — Z79899 Other long term (current) drug therapy: Secondary | ICD-10-CM | POA: Insufficient documentation

## 2022-04-22 DIAGNOSIS — D0511 Intraductal carcinoma in situ of right breast: Secondary | ICD-10-CM | POA: Insufficient documentation

## 2022-04-22 DIAGNOSIS — E119 Type 2 diabetes mellitus without complications: Secondary | ICD-10-CM | POA: Diagnosis not present

## 2022-04-22 DIAGNOSIS — Z86718 Personal history of other venous thrombosis and embolism: Secondary | ICD-10-CM | POA: Insufficient documentation

## 2022-04-22 DIAGNOSIS — Z833 Family history of diabetes mellitus: Secondary | ICD-10-CM | POA: Insufficient documentation

## 2022-04-22 DIAGNOSIS — Z510 Encounter for antineoplastic radiation therapy: Secondary | ICD-10-CM | POA: Insufficient documentation

## 2022-04-22 DIAGNOSIS — Z7901 Long term (current) use of anticoagulants: Secondary | ICD-10-CM | POA: Diagnosis not present

## 2022-04-22 DIAGNOSIS — Z803 Family history of malignant neoplasm of breast: Secondary | ICD-10-CM | POA: Insufficient documentation

## 2022-04-22 DIAGNOSIS — Z8379 Family history of other diseases of the digestive system: Secondary | ICD-10-CM | POA: Insufficient documentation

## 2022-04-22 DIAGNOSIS — I2510 Atherosclerotic heart disease of native coronary artery without angina pectoris: Secondary | ICD-10-CM | POA: Insufficient documentation

## 2022-04-22 DIAGNOSIS — Z170 Estrogen receptor positive status [ER+]: Secondary | ICD-10-CM | POA: Insufficient documentation

## 2022-04-22 DIAGNOSIS — Z888 Allergy status to other drugs, medicaments and biological substances status: Secondary | ICD-10-CM | POA: Insufficient documentation

## 2022-04-22 DIAGNOSIS — N183 Chronic kidney disease, stage 3 unspecified: Secondary | ICD-10-CM | POA: Diagnosis not present

## 2022-04-22 DIAGNOSIS — M069 Rheumatoid arthritis, unspecified: Secondary | ICD-10-CM | POA: Insufficient documentation

## 2022-04-22 DIAGNOSIS — I129 Hypertensive chronic kidney disease with stage 1 through stage 4 chronic kidney disease, or unspecified chronic kidney disease: Secondary | ICD-10-CM | POA: Insufficient documentation

## 2022-04-22 DIAGNOSIS — N189 Chronic kidney disease, unspecified: Secondary | ICD-10-CM | POA: Diagnosis not present

## 2022-04-22 DIAGNOSIS — E785 Hyperlipidemia, unspecified: Secondary | ICD-10-CM | POA: Diagnosis not present

## 2022-04-22 DIAGNOSIS — M353 Polymyalgia rheumatica: Secondary | ICD-10-CM | POA: Insufficient documentation

## 2022-04-22 DIAGNOSIS — D509 Iron deficiency anemia, unspecified: Secondary | ICD-10-CM | POA: Insufficient documentation

## 2022-04-22 DIAGNOSIS — Z8719 Personal history of other diseases of the digestive system: Secondary | ICD-10-CM | POA: Insufficient documentation

## 2022-04-22 DIAGNOSIS — E1122 Type 2 diabetes mellitus with diabetic chronic kidney disease: Secondary | ICD-10-CM | POA: Insufficient documentation

## 2022-04-22 NOTE — Assessment & Plan Note (Signed)
This is a 76 year old postmenopausal female patient with right breast DCIS ER/PR positive initially referred to breast [place] for recommendations.  She is status postlumpectomy which showed ductal carcinoma in situ, negative margins.  She has seen Dr. Lisbeth Renshaw this morning for adjuvant radiation.  We once again discussed about adjuvant antiestrogen therapy options.  She would like to consider aromatase inhibitors given her history of blood clots and need for chronic anticoagulation.  We have once again discussed the mechanism of action of aromatase inhibitors, adverse effects including but not limited to postmenopausal symptoms, arthralgias, vaginal dryness and bone density loss with time.  She has had a bone density scan in June 2022 which showed normal bone density.  She continues on Fosamax.  I have asked her to discuss need for ongoing Fosamax given her normal bone density with her doctor and she expressed understanding. She otherwise was recommended to continue to stay active, calcium and vitamin D supplementation as tolerated.  She will return to clinic after completion of radiation to start anastrozole.

## 2022-04-22 NOTE — Progress Notes (Signed)
[place] NOTE  Patient Care Team: Erskine Emery, MD as PCP - General (Family Medicine) Marica Otter, [place] (Optometry) Ashok Pall, MD as Consulting Physician (Neurosurgery) Rosita Fire, MD as Consulting Physician (Nephrology) Leonie Man, MD as Consulting Physician (Cardiology) Erroll Luna, MD as Consulting Physician (General Surgery) Benay Pike, MD as Consulting Physician (Hematology and Oncology) Kyung Rudd, MD as Consulting Physician (Radiation Oncology) Mauro Kaufmann, RN as Oncology Nurse Navigator Rockwell Germany, RN as Oncology Nurse Navigator  CHIEF COMPLAINTS/PURPOSE OF CONSULTATION:  Newly diagnosed breast cancer s/p surgery  HISTORY OF PRESENTING ILLNESS:  Erin Coffey 76 y.o. female is here because of recent diagnosis of right breast DCIS  I reviewed her records extensively and collaborated the history with the patient.  SUMMARY OF ONCOLOGIC HISTORY: Oncology History  Ductal carcinoma in situ (DCIS) of right breast  01/20/2022 Mammogram   Digital screening mammogram with incomplete evaluation.  Right breast focal asymmetry indeterminate. Diagnostic mammogram and ultrasound showed no correlate.  Stereotactic biopsy was recommended   02/14/2022 Pathology Results   Pathology from the right breast needle core biopsy showed DCIS, low to intermediate nuclear grade, cribriform and micropapillary types without necrosis and foci suspicious for microinvasion.  Negative for microcalcifications.  DCIS measured 2.2 mm in greatest extent   03/03/2022 Initial Diagnosis   Ductal carcinoma in situ (DCIS) of right breast   03/25/2022 Surgery   She had a right breast lumpectomy which showed DCIS abutting the posterior margin of resection, additional breast tissue from posterior margin negative for DCIS.  No invasive carcinoma.  Prognostics from prior biopsy showed 100% ER positive and 100% PR positive.     She otherwise has  history of DVT and PE and continues on chronic anticoagulation, also has type 2 diabetes mellitus with previous hemoglobin A1c of 6.2, hypertension, dyslipidemia, coronary artery disease and rheumatoid arthritis.  She is on multiple medications but tries to remain active.  No family history of breast cancer.  Sister had leukemia at a very young age  Interval history  She is status postlumpectomy and she is recovering well.  She just saw Dr. Lisbeth Renshaw and Ebony Hail today for adjuvant radiation.  She also had a postop appointment and everything is satisfactory.  She is willing to try anastrozole postradiation. Rest of the pertinent 10 point ROS reviewed and negative  MEDICAL HISTORY:  Past Medical History:  Diagnosis Date   Allergy    Anemia, iron deficiency    ANXIETY, SITUATIONAL 04/06/2009   Qualifier: Diagnosis of  By: [name]  MD, Stephanie     Arthritis    Back pain, thoracic 12/20/2012   Carpal tunnel syndrome 12/07/2014   Cholelithiasis 06/25/2012   Chronic kidney disease    DM type 2 goal A1C below 7.5 06/05/2008   Qualifier: Diagnosis of  By: [name]  MD, Col[place]     Family history of breast cancer 03/05/2022   GASTROESOPHAGEAL REFLUX, NO ESOPHAGITIS 10/08/2006   Qualifier: Diagnosis of  By: Griffin Dakin, unspecified 10/21/2007   Qualifier: Diagnosis of  By: [name] MD, [name]     History of DVT (deep vein thrombosis) 05/05/2013   on Eliquis   HYPERLIPIDEMIA 08/08/2008   Qualifier: Diagnosis of  By: [name]  MD, Stephanie     HYPERTENSION, BENIGN ESSENTIAL 11/27/2006   Qualifier: Diagnosis of  ByHoy Morn MD, [name]     Mild coronary artery disease 10/2019   Coronary CT Angiogram: Coronary calcium score 46.Normal coronary  origin.  Left dominance with tortuous coronary arteries.  Nondominant RCA - prox < 25% followed by ectasia (dilated to 5.5 mm) mixed plaque in distal vessel.  LM- normal . LAD: minimal mixed plaque in ostial & proximal (<25%) with brief IM bridging in mLAD. Mild  mid-distal (25-49%), RI - prox <25%, Large LCx- minimal (<25%) mid-dista   Mild coronary artery disease    Personal history of colonic polyps 10/24/2009   Qualifier: Diagnosis of  By: [name] MD, Sandy Salaam    [place] 08/08/2009   Qualifier: Diagnosis of  By: [name], [name]     Pituitary adenoma [facility])    Polymyalgia rheumatica ([place]) 10/02/2009   Qualifier: Diagnosis of  By: [name] MD, Apolonio Schneiders     Pulmonary embolism [facility]) - On long Term anticoagulation (Z79.01) 04/28/2011   Converted from warfarin to Eliquis; on long-term anticoagulation.   Thyroid disease    Trigger ring finger of left hand 11/02/2014    SURGICAL HISTORY: Past Surgical History:  Procedure Laterality Date   ABDOMINAL HYSTERECTOMY     BREAST LUMPECTOMY WITH RADIOACTIVE SEED LOCALIZATION Right 03/25/2022   Procedure: RIGHT BREAST SEED LUMPECTOMY;  Surgeon: Erroll Luna, MD;  Location: [facility];  Service: General;  Laterality: Right;   CARDIAC CATHETERIZATION  04/29/11   (Dr. Lia Foyer - ): LM - normal. LAD with 1 major Diag - normal, bifurcating Ramus - normal, Large (6-7 mm) Dominant LCx with 1 OM, 2 LPL branches & LPDA - non significant disease; small non-dominant RCA.     CARDIAC EVENT MONITOR  09/2019   Mostly sinus rhythm, average rate 60 bpm.  Range 45 to 190 bpm.  No critical events indicating any arrhythmias or pauses.  For patient responses noted PVCs.  Less than 1% PVCs noted.    CARPAL TUNNEL RELEASE Right 1996   COLONOSCOPY     KNEE ARTHROSCOPY  Bilateral   TRANSTHORACIC ECHOCARDIOGRAM  09/2019   Normal EF - 60 to 65%.  Mild LVH.  GR 1 DD.  Normal RV.  Trivial MR.  Normal IVC.  Normal PA pressures.   TUBAL LIGATION     UPPER GASTROINTESTINAL ENDOSCOPY      SOCIAL HISTORY: Social History   Socioeconomic History   Marital status: Widowed    Spouse name: Not on file   Number of children: 3   Years of education: 10   Highest education level: 10th grade  Occupational History    Occupation: Retired- Airline pilot: [employer]  Tobacco Use   Smoking status: Never   Smokeless tobacco: Never  Scientific laboratory technician Use: Never used  Substance and Sexual Activity   Alcohol use: No    Alcohol/week: 0.0 standard drinks of alcohol   Drug use: No   Sexual activity: Not Currently  Other Topics Concern   Not on file  Social History Narrative   Patient lives alone in [place], she is a widow.   Patient has 3 children. Two daughters live locally, one son in [place].    Patient is very active in her church and volunteer work to stay busy.    Patient enjoys cooking, spending time with family, and crossword puzzles.       Social Determinants of Health   Financial Resource Strain: Low Risk  (03/05/2022)   Overall Financial Resource Strain (CARDIA)    Difficulty of Paying Living Expenses: Not very hard  Food Insecurity: No Food Insecurity (03/05/2022)   Hunger Vital Sign  Worried About Charity fundraiser in the Last Year: Never true    [place] in the Last Year: Never true  Transportation Needs: No Transportation Needs (03/05/2022)   PRAPARE - Hydrologist (Medical): No    Lack of Transportation (Non-Medical): No  Physical Activity: Sufficiently Active (03/03/2019)   Exercise Vital Sign    Days of Exercise per Week: 3 days    Minutes of Exercise per Session: 60 min  Stress: No Stress Concern Present (03/05/2022)   [place]    Feeling of Stress : Not at all  Social Connections: Moderately Integrated (01/05/2020)   Social Connection and Isolation Panel [NHANES]    Frequency of Communication with Friends and Family: More than three times a week    Frequency of Social Gatherings with Friends and Family: More than three times a week    Attends Religious Services: More than 4 times per year    Active Member of [employer] or Organizations: Yes    Attends Theatre manager Meetings: More than 4 times per year    Marital Status: Widowed  Intimate Partner Violence: Not At Risk (03/03/2019)   Humiliation, Afraid, Rape, and Kick questionnaire    Fear of Current or Ex-Partner: No    Emotionally Abused: No    Physically Abused: No    Sexually Abused: No    FAMILY HISTORY: Family History  Problem Relation Age of Onset   Diabetes Mother    Heart disease Mother    Hypertension Mother    Heart disease Father    Hypertension Father    Diabetes Sister    Heart disease Sister    Leukemia Sister 46   Cirrhosis Sister        alcohol   Kidney disease Sister    Diabetes Sister    Hypertension Brother    Diabetes Brother    Breast cancer Cousin        dx 47s; maternal female cousin   Colon cancer Neg Hx    Pancreatic cancer Neg Hx    Esophageal cancer Neg Hx     ALLERGIES:  is allergic to lisinopril.  MEDICATIONS:  Current Outpatient Medications  Medication Sig Dispense Refill   acetaminophen (TYLENOL) 650 MG CR tablet Take 1,300 mg by mouth every 8 (eight) hours as needed for pain.     alendronate (FOSAMAX) 70 MG tablet TAKE 1 TABLET BY MOUTH  WEEKLY WITH 8 OZ OF PLAIN  WATER 30 MINUTES BEFORE  FIRST FOOD, DRINK OR MEDS.  STAY UPRIGHT FOR 30 MINS 12 tablet 3   amLODipine (NORVASC) 5 MG tablet TAKE 1 TABLET BY MOUTH AT  BEDTIME 90 tablet 3   atorvastatin (LIPITOR) 40 MG tablet TAKE 1 TABLET BY MOUTH  DAILY 100 tablet 2   baclofen (LIORESAL) 10 MG tablet Take 1 tablet (10 mg total) by mouth daily as needed for muscle spasms. 30 each 0   Blood Glucose Monitoring Suppl (ONETOUCH VERIO) w/Device KIT Use as instructed to test sugars once daily.  Dx Code: E11.9 1 kit 0   CHOLECALCIFEROL PO Take 2,200 Units by mouth daily.     ELIQUIS 5 MG TABS tablet TAKE 1 TABLET BY MOUTH  TWICE DAILY 180 tablet 3   fish oil-omega-3 fatty acids 1000 MG capsule Take 1 g by mouth daily.     gabapentin (NEURONTIN) 100 MG capsule TAKE 1 CAPSULE BY MOUTH TWICE  DAILY  180 capsule 3   glucose blood (ONETOUCH VERIO) test strip Use as instructed to test sugars once daily.  Dx Code: E11.9 100 each 12   iron polysaccharides (NIFEREX) 150 MG capsule      ketoconazole (NIZORAL) 2 % cream Apply to both feet once daily for 4 weeks. 30 g 1   Lancet Devices (ONE TOUCH DELICA LANCING DEV) MISC Use as instructed to test sugars once daily.  Dx Code: E11.9 1 each 0   losartan (COZAAR) 100 MG tablet Take 1 tablet (100 mg total) by mouth daily. 90 tablet 1   metoprolol tartrate (LOPRESSOR) 25 MG tablet TAKE 1 TABLET BY MOUTH  TWICE DAILY 200 tablet 2   omeprazole (PRILOSEC) 20 MG capsule Take 1 capsule (20 mg total) by mouth daily. 30 capsule 0   OneTouch Delica Lancets 83T MISC Use as instructed to test sugars once daily.  Dx Code: E11.9 100 each 12   oxyCODONE (OXY IR/ROXICODONE) 5 MG immediate release tablet Take 1 tablet (5 mg total) by mouth every 6 (six) hours as needed for severe pain. 15 tablet 0   polyethylene glycol powder (GLYCOLAX/MIRALAX) powder Take 17 g by mouth 2 (two) times daily as needed. (Patient taking differently: Take 17 g by mouth daily as needed (constipation).) 3350 g 1   traMADol (ULTRAM) 50 MG tablet TAKE 1 TABLET BY MOUTH EVERY 6 HOURS AS NEEDED 30 tablet 0   vitamin C (ASCORBIC ACID) 500 MG tablet Take 500 mg by mouth daily.     No current facility-administered medications for this visit.    REVIEW OF SYSTEMS:   Constitutional: Denies fevers, chills or abnormal night sweats Eyes: Denies blurriness of vision, double vision or watery eyes Ears, nose, mouth, throat, and face: Denies mucositis or sore throat Respiratory: Denies cough, dyspnea or wheezes Cardiovascular: Denies palpitation, chest discomfort or lower extremity swelling Gastrointestinal:  Denies nausea, heartburn or change in bowel habits Skin: Denies abnormal skin rashes Lymphatics: Denies new lymphadenopathy or easy bruising Neurological:Denies numbness, tingling or new  weaknesses Behavioral/Psych: Mood is stable, no new changes  Breast: Denies any palpable lumps or discharge All other systems were reviewed with the patient and are negative.  PHYSICAL EXAMINATION: ECOG PERFORMANCE STATUS: 0 - Asymptomatic  Vitals:   04/22/22 1139  BP: (!) 166/60  Pulse: (!) 56  Resp: 16  Temp: (!) 97.5 F (36.4 C)  SpO2: 100%   Filed Weights   04/22/22 1139  Weight: 290 lb 6.4 oz (131.7 kg)    GENERAL:alert, no distress and comfortable Rest of the physical exam deferred in lieu of counseling  LABORATORY DATA:  I have reviewed the data as listed Lab Results  Component Value Date   WBC 4.8 03/05/2022   HGB 11.2 (L) 03/05/2022   HCT 34.3 (L) 03/05/2022   MCV 91.5 03/05/2022   PLT 189 03/05/2022   Lab Results  Component Value Date   NA 139 03/05/2022   K 4.4 03/05/2022   CL 109 03/05/2022   CO2 24 03/05/2022    RADIOGRAPHIC STUDIES: I have personally reviewed the radiological reports and agreed with the findings in the report.  ASSESSMENT AND PLAN:  Ductal carcinoma in situ (DCIS) of right breast This is a 76 year old postmenopausal female patient with right breast DCIS ER/PR positive initially referred to breast [facility] for recommendations.  She is status postlumpectomy which showed ductal carcinoma in situ, negative margins.  She has seen Dr. Lisbeth Renshaw this morning for adjuvant radiation.  We once again discussed  about adjuvant antiestrogen therapy options.  She would like to consider aromatase inhibitors given her history of blood clots and need for chronic anticoagulation.  We have once again discussed the mechanism of action of aromatase inhibitors, adverse effects including but not limited to postmenopausal symptoms, arthralgias, vaginal dryness and bone density loss with time.  She has had a bone density scan in June 2022 which showed normal bone density.  She continues on Fosamax.  I have asked her to discuss need for ongoing Fosamax given her normal bone  density with her doctor and she expressed understanding. She otherwise was recommended to continue to stay active, calcium and vitamin D supplementation as tolerated.  She will return to clinic after completion of radiation to start anastrozole.  Total time spent: 30 minutes including history, physical, review of records, counseling and coordination of care All questions were answered. The patient knows to call the clinic with any problems, questions or concerns.    Benay Pike, MD 04/22/22

## 2022-04-22 NOTE — Progress Notes (Addendum)
Consult appointment. I verified patient's identity and began nursing interview. Patient denies any RT breast discomfort at this time. Surgical incision around RT nipple healing well.  Meaningful use complete. Hysterectomy  BP (!) 177/61 (BP Location: Left Arm, Patient Position: Sitting, Cuff Size: Large)   Pulse (!) 59   Temp 97.8 F (36.6 C)   Resp 20   Ht '5\' 8"'$  (1.727 m)   Wt 290 lb 6.4 oz (131.7 kg)   SpO2 100%   BMI 44.16 kg/m

## 2022-04-28 ENCOUNTER — Encounter: Payer: Self-pay | Admitting: *Deleted

## 2022-04-28 DIAGNOSIS — Z170 Estrogen receptor positive status [ER+]: Secondary | ICD-10-CM | POA: Diagnosis not present

## 2022-04-28 DIAGNOSIS — D0511 Intraductal carcinoma in situ of right breast: Secondary | ICD-10-CM | POA: Insufficient documentation

## 2022-04-28 DIAGNOSIS — Z510 Encounter for antineoplastic radiation therapy: Secondary | ICD-10-CM | POA: Diagnosis not present

## 2022-04-29 ENCOUNTER — Ambulatory Visit: Payer: Medicare Other | Admitting: Radiation Oncology

## 2022-04-30 ENCOUNTER — Other Ambulatory Visit: Payer: Self-pay

## 2022-04-30 ENCOUNTER — Ambulatory Visit
Admission: RE | Admit: 2022-04-30 | Discharge: 2022-04-30 | Disposition: A | Discharge status: Home / Self Care | Payer: Medicare Other | Source: Ambulatory Visit | Attending: Radiation Oncology | Admitting: Radiation Oncology

## 2022-04-30 ENCOUNTER — Ambulatory Visit: Payer: Medicare Other

## 2022-04-30 DIAGNOSIS — Z510 Encounter for antineoplastic radiation therapy: Secondary | ICD-10-CM | POA: Diagnosis not present

## 2022-04-30 DIAGNOSIS — D0511 Intraductal carcinoma in situ of right breast: Secondary | ICD-10-CM | POA: Diagnosis not present

## 2022-04-30 DIAGNOSIS — Z170 Estrogen receptor positive status [ER+]: Secondary | ICD-10-CM | POA: Diagnosis not present

## 2022-04-30 LAB — RAD ONC ARIA SESSION SUMMARY
Course Elapsed Days: 0
Plan Fractions Treated to Date: 1
Plan Prescribed Dose Per Fraction: 2.66 Gy
Plan Total Fractions Prescribed: 16
Plan Total Prescribed Dose: 42.56 Gy
Reference Point Dosage Given to Date: 2.66 Gy
Reference Point Session Dosage Given: 2.66 Gy
Session Number: 1

## 2022-05-01 ENCOUNTER — Other Ambulatory Visit: Payer: Self-pay

## 2022-05-01 ENCOUNTER — Ambulatory Visit
Admission: RE | Admit: 2022-05-01 | Discharge: 2022-05-01 | Disposition: A | Discharge status: Home / Self Care | Payer: Medicare Other | Source: Ambulatory Visit | Attending: Radiation Oncology | Admitting: Radiation Oncology

## 2022-05-01 DIAGNOSIS — D0511 Intraductal carcinoma in situ of right breast: Secondary | ICD-10-CM | POA: Diagnosis not present

## 2022-05-01 DIAGNOSIS — Z170 Estrogen receptor positive status [ER+]: Secondary | ICD-10-CM | POA: Diagnosis not present

## 2022-05-01 DIAGNOSIS — Z510 Encounter for antineoplastic radiation therapy: Secondary | ICD-10-CM | POA: Diagnosis not present

## 2022-05-01 LAB — RAD ONC ARIA SESSION SUMMARY
Course Elapsed Days: 1
Plan Fractions Treated to Date: 2
Plan Prescribed Dose Per Fraction: 2.66 Gy
Plan Total Fractions Prescribed: 16
Plan Total Prescribed Dose: 42.56 Gy
Reference Point Dosage Given to Date: 5.32 Gy
Reference Point Session Dosage Given: 2.66 Gy
Session Number: 2

## 2022-05-01 NOTE — Progress Notes (Signed)
Pt here for patient teaching.  Pt given Radiation and You booklet, skin care instructions, alra deodorant and Radiaplex.    Reviewed areas of pertinence such as fatigue, hair loss, skin changes, breast tenderness, and breast swelling. Pt able to give teach back of to pat skin and use unscented/gentle soap,apply Radiaplex bid, avoid applying anything to skin within 4 hours of treatment, avoid wearing an under wire bra, and to use an electric razor if they must shave. Pt verbalizes understanding of information given and will contact nursing with any questions or concerns.    Chantalle Defilippo M. Donavon Kimrey RN, BSN  

## 2022-05-02 ENCOUNTER — Other Ambulatory Visit: Payer: Self-pay

## 2022-05-02 ENCOUNTER — Ambulatory Visit
Admission: RE | Admit: 2022-05-02 | Discharge: 2022-05-02 | Disposition: A | Discharge status: Home / Self Care | Payer: Medicare Other | Source: Ambulatory Visit | Attending: Radiation Oncology | Admitting: Radiation Oncology

## 2022-05-02 DIAGNOSIS — D0511 Intraductal carcinoma in situ of right breast: Secondary | ICD-10-CM | POA: Diagnosis not present

## 2022-05-02 DIAGNOSIS — Z170 Estrogen receptor positive status [ER+]: Secondary | ICD-10-CM | POA: Diagnosis not present

## 2022-05-02 DIAGNOSIS — Z510 Encounter for antineoplastic radiation therapy: Secondary | ICD-10-CM | POA: Diagnosis not present

## 2022-05-02 LAB — RAD ONC ARIA SESSION SUMMARY
Course Elapsed Days: 2
Plan Fractions Treated to Date: 3
Plan Prescribed Dose Per Fraction: 2.66 Gy
Plan Total Fractions Prescribed: 16
Plan Total Prescribed Dose: 42.56 Gy
Reference Point Dosage Given to Date: 7.98 Gy
Reference Point Session Dosage Given: 2.66 Gy
Session Number: 3

## 2022-05-02 MED ORDER — RADIAPLEXRX EX GEL: Freq: Once | CUTANEOUS | Status: AC

## 2022-05-05 ENCOUNTER — Ambulatory Visit
Admission: RE | Admit: 2022-05-05 | Discharge: 2022-05-05 | Disposition: A | Discharge status: Home / Self Care | Payer: Medicare Other | Source: Ambulatory Visit | Attending: Radiation Oncology | Admitting: Radiation Oncology

## 2022-05-05 ENCOUNTER — Other Ambulatory Visit: Payer: Self-pay

## 2022-05-05 DIAGNOSIS — Z510 Encounter for antineoplastic radiation therapy: Secondary | ICD-10-CM | POA: Diagnosis not present

## 2022-05-05 DIAGNOSIS — Z170 Estrogen receptor positive status [ER+]: Secondary | ICD-10-CM | POA: Diagnosis not present

## 2022-05-05 DIAGNOSIS — D0511 Intraductal carcinoma in situ of right breast: Secondary | ICD-10-CM | POA: Diagnosis not present

## 2022-05-05 LAB — RAD ONC ARIA SESSION SUMMARY
Course Elapsed Days: 5
Plan Fractions Treated to Date: 4
Plan Prescribed Dose Per Fraction: 2.66 Gy
Plan Total Fractions Prescribed: 16
Plan Total Prescribed Dose: 42.56 Gy
Reference Point Dosage Given to Date: 10.64 Gy
Reference Point Session Dosage Given: 2.66 Gy
Session Number: 4

## 2022-05-06 ENCOUNTER — Other Ambulatory Visit: Payer: Self-pay

## 2022-05-06 ENCOUNTER — Ambulatory Visit
Admission: RE | Admit: 2022-05-06 | Discharge: 2022-05-06 | Disposition: A | Discharge status: Home / Self Care | Payer: Medicare Other | Source: Ambulatory Visit | Attending: Radiation Oncology | Admitting: Radiation Oncology

## 2022-05-06 DIAGNOSIS — D0511 Intraductal carcinoma in situ of right breast: Secondary | ICD-10-CM | POA: Diagnosis not present

## 2022-05-06 DIAGNOSIS — Z510 Encounter for antineoplastic radiation therapy: Secondary | ICD-10-CM | POA: Diagnosis not present

## 2022-05-06 DIAGNOSIS — Z170 Estrogen receptor positive status [ER+]: Secondary | ICD-10-CM | POA: Diagnosis not present

## 2022-05-06 LAB — RAD ONC ARIA SESSION SUMMARY
Course Elapsed Days: 6
Plan Fractions Treated to Date: 5
Plan Prescribed Dose Per Fraction: 2.66 Gy
Plan Total Fractions Prescribed: 16
Plan Total Prescribed Dose: 42.56 Gy
Reference Point Dosage Given to Date: 13.3 Gy
Reference Point Session Dosage Given: 2.66 Gy
Session Number: 5

## 2022-05-07 ENCOUNTER — Other Ambulatory Visit: Payer: Self-pay

## 2022-05-07 ENCOUNTER — Ambulatory Visit
Admission: RE | Admit: 2022-05-07 | Discharge: 2022-05-07 | Disposition: A | Discharge status: Home / Self Care | Payer: Medicare Other | Source: Ambulatory Visit | Attending: Radiation Oncology | Admitting: Radiation Oncology

## 2022-05-07 ENCOUNTER — Other Ambulatory Visit: Payer: Self-pay | Admitting: Podiatry

## 2022-05-07 DIAGNOSIS — M7751 Other enthesopathy of right foot: Secondary | ICD-10-CM

## 2022-05-07 DIAGNOSIS — D0511 Intraductal carcinoma in situ of right breast: Secondary | ICD-10-CM | POA: Diagnosis not present

## 2022-05-07 DIAGNOSIS — Z510 Encounter for antineoplastic radiation therapy: Secondary | ICD-10-CM | POA: Diagnosis not present

## 2022-05-07 DIAGNOSIS — Z170 Estrogen receptor positive status [ER+]: Secondary | ICD-10-CM | POA: Diagnosis not present

## 2022-05-07 LAB — RAD ONC ARIA SESSION SUMMARY
Course Elapsed Days: 7
Plan Fractions Treated to Date: 6
Plan Prescribed Dose Per Fraction: 2.66 Gy
Plan Total Fractions Prescribed: 16
Plan Total Prescribed Dose: 42.56 Gy
Reference Point Dosage Given to Date: 15.96 Gy
Reference Point Session Dosage Given: 2.66 Gy
Session Number: 6

## 2022-05-08 ENCOUNTER — Ambulatory Visit
Admission: RE | Admit: 2022-05-08 | Discharge: 2022-05-08 | Disposition: A | Discharge status: Home / Self Care | Payer: Medicare Other | Source: Ambulatory Visit | Attending: Radiation Oncology | Admitting: Radiation Oncology

## 2022-05-08 ENCOUNTER — Other Ambulatory Visit: Payer: Self-pay

## 2022-05-08 DIAGNOSIS — Z510 Encounter for antineoplastic radiation therapy: Secondary | ICD-10-CM | POA: Diagnosis not present

## 2022-05-08 DIAGNOSIS — D0511 Intraductal carcinoma in situ of right breast: Secondary | ICD-10-CM | POA: Diagnosis not present

## 2022-05-08 LAB — RAD ONC ARIA SESSION SUMMARY
Course Elapsed Days: 8
Plan Fractions Treated to Date: 7
Plan Prescribed Dose Per Fraction: 2.66 Gy
Plan Total Fractions Prescribed: 16
Plan Total Prescribed Dose: 42.56 Gy
Reference Point Dosage Given to Date: 18.62 Gy
Reference Point Session Dosage Given: 2.66 Gy
Session Number: 7

## 2022-05-09 ENCOUNTER — Other Ambulatory Visit: Payer: Self-pay

## 2022-05-09 ENCOUNTER — Ambulatory Visit
Admission: RE | Admit: 2022-05-09 | Discharge: 2022-05-09 | Disposition: A | Discharge status: Home / Self Care | Payer: Medicare Other | Source: Ambulatory Visit | Attending: Radiation Oncology | Admitting: Radiation Oncology

## 2022-05-09 DIAGNOSIS — Z170 Estrogen receptor positive status [ER+]: Secondary | ICD-10-CM | POA: Diagnosis not present

## 2022-05-09 DIAGNOSIS — Z510 Encounter for antineoplastic radiation therapy: Secondary | ICD-10-CM | POA: Diagnosis not present

## 2022-05-09 DIAGNOSIS — D0511 Intraductal carcinoma in situ of right breast: Secondary | ICD-10-CM | POA: Diagnosis not present

## 2022-05-09 LAB — RAD ONC ARIA SESSION SUMMARY
Course Elapsed Days: 9
Plan Fractions Treated to Date: 8
Plan Prescribed Dose Per Fraction: 2.66 Gy
Plan Total Fractions Prescribed: 16
Plan Total Prescribed Dose: 42.56 Gy
Reference Point Dosage Given to Date: 21.28 Gy
Reference Point Session Dosage Given: 2.66 Gy
Session Number: 8

## 2022-05-11 NOTE — Progress Notes (Signed)
  SUBJECTIVE:   CHIEF COMPLAINT / HPI:  Breast Cancer  Right breast DCIS ER/PR positive cancer s/p lumpectomy with negative margins undergoing radiation.   HM: Needs UACr, eye exam, flu vaccine, foot exam   DEXA scan with normal bone density June 2022, on Fosamax.   Hypertension: BP: (!) 147/60 today. Home medications include: Amlodipine 5 mg, Metoprolol '25mg'$ , losartan '100mg'$ . She endorses taking these medications as prescribed. Does check blood pressure at home: 110s/60s via wrist monitor then 135/60 most recently at home.   Most recent creatinine trend:  Lab Results  Component Value Date   CREATININE 1.38 (H) 03/05/2022   CREATININE 1.26 (H) 01/17/2022   CREATININE 1.30 (H) 09/03/2021   Patient has had a BMP in the past 1 year.   PERTINENT  PMH / PSH:   CAD, H/o PE, HTN, reflux, diverticulosis, HLD, DM2, osteoporosis, DCIS of right breast   OBJECTIVE:  BP (!) 147/60   Pulse (!) 57   Ht '5\' 8"'$  (1.727 m)   Wt 292 lb 3.2 oz (132.5 kg)   SpO2 100%   BMI 44.43 kg/m   General: NAD, pleasant, able to participate in exam; very pleasant AAF Cardiac: RRR, no murmurs auscultated Respiratory: CTAB, normal WOB Abdomen: soft, non-tender, non-distended, normoactive bowel sounds Extremities: warm and well perfused, no edema or cyanosis Skin: warm and dry, no rashes noted Psych: Normal affect and mood  Diabetic Foot Exam - Simple   Simple Foot Form Visual Inspection No deformities, no ulcerations, no other skin breakdown bilaterally: Yes Sensation Testing Intact to touch and monofilament testing bilaterally: Yes Pulse Check Posterior Tibialis and Dorsalis pulse intact bilaterally: Yes Comments       ASSESSMENT/PLAN:  Type 2 diabetes mellitus with hemoglobin A1c goal of less than 7.5% (HCC) Assessment & Plan: A1C and UACr ordered today. Not on medications.  Reports that she is UTD on eye exam  Foot exam performed today  On statin: Lipitor '40mg'$     Orders: -     POCT  glycosylated hemoglobin (Hb A1C) -     Microalbumin / creatinine urine ratio; Future  Osteoarthritis of multiple joints, unspecified osteoarthritis type Assessment & Plan: Takes Baclofen for pain control in addition to OTC medications. Refill sent upon request.   Orders: -     Baclofen; Take 1 tablet (10 mg total) by mouth daily as needed for muscle spasms.  Dispense: 30 each; Refill: 0  Need for immunization against influenza -     Flu Vaccine QUAD High Dose(Fluad)  HYPERTENSION, BENIGN ESSENTIAL Assessment & Plan: BP: (!) 147/60 today. Moderately controlled. Because she has appropriate pressures at home, instructed to continue with checking BP at home and inform me of the results if inappropriately elevated. Continue to work on healthy dietary habits and exercise. Follow up in 3 months   Medication regimen: Amlodipine 5 mg, Metoprolol 25 mg, losartan '100mg'$ .     Other acute pulmonary embolism without acute cor pulmonale (HCC) Assessment & Plan: Appropriate O2 saturation, continue with Eliquis.    Age-related osteoporosis with current pathological fracture, initial encounter Assessment & Plan: Will continue with Fosamax for a total of 5 years after pathologic fracture and then will discontinue.     Return in about 3 months (around 08/12/2022) for A1C, DM2 . Erskine Emery, MD 05/13/2022, 4:31 PM PGY-2, [facility]

## 2022-05-12 ENCOUNTER — Other Ambulatory Visit: Payer: Self-pay

## 2022-05-12 ENCOUNTER — Encounter: Payer: Self-pay | Admitting: Student

## 2022-05-12 ENCOUNTER — Ambulatory Visit
Admission: RE | Admit: 2022-05-12 | Discharge: 2022-05-12 | Disposition: A | Discharge status: Home / Self Care | Payer: Medicare Other | Source: Ambulatory Visit | Attending: Radiation Oncology | Admitting: Radiation Oncology

## 2022-05-12 ENCOUNTER — Ambulatory Visit (INDEPENDENT_AMBULATORY_CARE_PROVIDER_SITE_OTHER): Payer: Medicare Other | Admitting: Student

## 2022-05-12 VITALS — BP 147/60 | HR 57 | Ht 68.0 in | Wt 292.2 lb

## 2022-05-12 DIAGNOSIS — Z510 Encounter for antineoplastic radiation therapy: Secondary | ICD-10-CM | POA: Diagnosis not present

## 2022-05-12 DIAGNOSIS — M159 Polyosteoarthritis, unspecified: Secondary | ICD-10-CM | POA: Diagnosis not present

## 2022-05-12 DIAGNOSIS — Z170 Estrogen receptor positive status [ER+]: Secondary | ICD-10-CM | POA: Diagnosis not present

## 2022-05-12 DIAGNOSIS — D0511 Intraductal carcinoma in situ of right breast: Secondary | ICD-10-CM | POA: Insufficient documentation

## 2022-05-12 DIAGNOSIS — E119 Type 2 diabetes mellitus without complications: Secondary | ICD-10-CM | POA: Diagnosis not present

## 2022-05-12 DIAGNOSIS — M8000XA Age-related osteoporosis with current pathological fracture, unspecified site, initial encounter for fracture: Secondary | ICD-10-CM

## 2022-05-12 DIAGNOSIS — M545 Low back pain, unspecified: Secondary | ICD-10-CM

## 2022-05-12 DIAGNOSIS — Z23 Encounter for immunization: Secondary | ICD-10-CM | POA: Diagnosis not present

## 2022-05-12 DIAGNOSIS — I2699 Other pulmonary embolism without acute cor pulmonale: Secondary | ICD-10-CM

## 2022-05-12 DIAGNOSIS — I10 Essential (primary) hypertension: Secondary | ICD-10-CM | POA: Diagnosis not present

## 2022-05-12 LAB — POCT GLYCOSYLATED HEMOGLOBIN (HGB A1C): HbA1c, POC (controlled diabetic range): 6.2 % (ref 0.0–7.0)

## 2022-05-12 LAB — RAD ONC ARIA SESSION SUMMARY
Course Elapsed Days: 12
Plan Fractions Treated to Date: 9
Plan Prescribed Dose Per Fraction: 2.66 Gy
Plan Total Fractions Prescribed: 16
Plan Total Prescribed Dose: 42.56 Gy
Reference Point Dosage Given to Date: 23.94 Gy
Reference Point Session Dosage Given: 2.66 Gy
Session Number: 9

## 2022-05-12 MED ORDER — BACLOFEN 10 MG PO TABS: 10.0000 mg | ORAL_TABLET | Freq: Every day | ORAL | 0 refills | Status: DC | PRN

## 2022-05-12 NOTE — Patient Instructions (Addendum)
It was great to see you today! Thank you for choosing [facility] for your primary care. Erin Coffey was seen for follow up.  Today we addressed: We will order refills of your medications  Ordered A1C and will check your urine  You can continue Foasmax for a total of two more years   If you haven't already, sign up for My Chart to have easy access to your labs results, and communication with your primary care physician.  We are checking some labs today. If they are abnormal, I will call you. If they are normal, I will send you a MyChart message (if it is active) or a letter in the mail. If you do not hear about your labs in the next 2 weeks, please call the office. I recommend that you always bring your medications to each appointment as this makes it easy to ensure you are on the correct medications and helps [name] not miss refills when you need them. Call the clinic at [phone] if your symptoms worsen or you have any concerns.  You should return to our clinic Return in about 3 months (around 08/12/2022) for A1C, DM2 . Please arrive 15 minutes before your appointment to ensure smooth check in process.  We appreciate your efforts in making this happen.  Thank you for allowing me to participate in your care, Erskine Emery, MD 05/12/2022, 8:56 AM PGY-2, [facility]

## 2022-05-13 ENCOUNTER — Ambulatory Visit
Admission: RE | Admit: 2022-05-13 | Discharge: 2022-05-13 | Disposition: A | Discharge status: Home / Self Care | Payer: Medicare Other | Source: Ambulatory Visit | Attending: Radiation Oncology | Admitting: Radiation Oncology

## 2022-05-13 ENCOUNTER — Other Ambulatory Visit: Payer: Self-pay

## 2022-05-13 ENCOUNTER — Encounter: Payer: Self-pay | Admitting: Radiation Oncology

## 2022-05-13 DIAGNOSIS — Z170 Estrogen receptor positive status [ER+]: Secondary | ICD-10-CM | POA: Diagnosis not present

## 2022-05-13 DIAGNOSIS — D0511 Intraductal carcinoma in situ of right breast: Secondary | ICD-10-CM | POA: Diagnosis not present

## 2022-05-13 DIAGNOSIS — Z510 Encounter for antineoplastic radiation therapy: Secondary | ICD-10-CM | POA: Diagnosis not present

## 2022-05-13 LAB — RAD ONC ARIA SESSION SUMMARY
Course Elapsed Days: 13
Plan Fractions Treated to Date: 10
Plan Prescribed Dose Per Fraction: 2.66 Gy
Plan Total Fractions Prescribed: 16
Plan Total Prescribed Dose: 42.56 Gy
Reference Point Dosage Given to Date: 26.6 Gy
Reference Point Session Dosage Given: 2.66 Gy
Session Number: 10

## 2022-05-13 NOTE — Assessment & Plan Note (Signed)
Will continue with Fosamax for a total of 5 years after pathologic fracture and then will discontinue.

## 2022-05-13 NOTE — Assessment & Plan Note (Signed)
Takes Baclofen for pain control in addition to OTC medications. Refill sent upon request.

## 2022-05-13 NOTE — Assessment & Plan Note (Signed)
Appropriate O2 saturation, continue with Eliquis.

## 2022-05-13 NOTE — Assessment & Plan Note (Addendum)
BP: (!) 147/60 today. Moderately controlled. Because she has appropriate pressures at home, instructed to continue with checking BP at home and inform me of the results if inappropriately elevated. Continue to work on healthy dietary habits and exercise. Follow up in 3 months   Medication regimen: Amlodipine 5 mg, Metoprolol 25 mg, losartan '100mg'$ .

## 2022-05-13 NOTE — Assessment & Plan Note (Addendum)
A1C and UACr ordered today. Not on medications.  Reports that she is UTD on eye exam  Foot exam performed today  On statin: Lipitor '40mg'$ 

## 2022-05-13 NOTE — Progress Notes (Signed)
  Radiation Oncology         [phone] ________________________________  Name: Erin Coffey MRN: [id number]  Date: 05/13/2022  DOB: [birth year]  SIMULATION NOTE   NARRATIVE:  The patient underwent simulation today for ongoing radiation therapy.  The existing CT study set was employed for the purpose of virtual treatment planning.  The target and avoidance structures were reviewed and modified as necessary.  Treatment planning then occurred.  The radiation boost prescription was entered and confirmed.  A total of 3 complex treatment devices were fabricated in the form of multi-leaf collimators to shape radiation around the targets while maximally excluding nearby normal structures. I have requested : Isodose Plan.    PLAN:  This modified radiation beam arrangement is intended to continue the current radiation dose to an additional 8 Gy in 4 fractions for a total cumulative dose of 50.56 Gy.    ------------------------------------------------  Jodelle Gross, MD, PhD

## 2022-05-14 ENCOUNTER — Other Ambulatory Visit: Payer: Self-pay

## 2022-05-14 ENCOUNTER — Ambulatory Visit
Admission: RE | Admit: 2022-05-14 | Discharge: 2022-05-14 | Disposition: A | Discharge status: Home / Self Care | Payer: Medicare Other | Source: Ambulatory Visit | Attending: Radiation Oncology | Admitting: Radiation Oncology

## 2022-05-14 DIAGNOSIS — Z510 Encounter for antineoplastic radiation therapy: Secondary | ICD-10-CM | POA: Diagnosis not present

## 2022-05-14 DIAGNOSIS — Z170 Estrogen receptor positive status [ER+]: Secondary | ICD-10-CM | POA: Diagnosis not present

## 2022-05-14 DIAGNOSIS — D0511 Intraductal carcinoma in situ of right breast: Secondary | ICD-10-CM | POA: Diagnosis not present

## 2022-05-14 LAB — RAD ONC ARIA SESSION SUMMARY
Course Elapsed Days: 14
Plan Fractions Treated to Date: 11
Plan Prescribed Dose Per Fraction: 2.66 Gy
Plan Total Fractions Prescribed: 16
Plan Total Prescribed Dose: 42.56 Gy
Reference Point Dosage Given to Date: 29.26 Gy
Reference Point Session Dosage Given: 2.66 Gy
Session Number: 11

## 2022-05-15 ENCOUNTER — Other Ambulatory Visit: Payer: Self-pay

## 2022-05-15 ENCOUNTER — Ambulatory Visit
Admission: RE | Admit: 2022-05-15 | Discharge: 2022-05-15 | Disposition: A | Discharge status: Home / Self Care | Payer: Medicare Other | Source: Ambulatory Visit | Attending: Radiation Oncology | Admitting: Radiation Oncology

## 2022-05-15 DIAGNOSIS — Z510 Encounter for antineoplastic radiation therapy: Secondary | ICD-10-CM | POA: Diagnosis not present

## 2022-05-15 DIAGNOSIS — G8929 Other chronic pain: Secondary | ICD-10-CM

## 2022-05-15 DIAGNOSIS — D0511 Intraductal carcinoma in situ of right breast: Secondary | ICD-10-CM | POA: Diagnosis not present

## 2022-05-15 DIAGNOSIS — Z170 Estrogen receptor positive status [ER+]: Secondary | ICD-10-CM | POA: Diagnosis not present

## 2022-05-15 LAB — RAD ONC ARIA SESSION SUMMARY
Course Elapsed Days: 15
Plan Fractions Treated to Date: 12
Plan Prescribed Dose Per Fraction: 2.66 Gy
Plan Total Fractions Prescribed: 16
Plan Total Prescribed Dose: 42.56 Gy
Reference Point Dosage Given to Date: 31.92 Gy
Reference Point Session Dosage Given: 2.66 Gy
Session Number: 12

## 2022-05-15 NOTE — Telephone Encounter (Signed)
Patient calls nurse line requesting a refill on Tramadol.   Patient reports she was seen on 10/2 and refilling medication was discussed.  Patient needs this to go to [place] on [address].   Will forward to PCP.

## 2022-05-16 ENCOUNTER — Ambulatory Visit
Admission: RE | Admit: 2022-05-16 | Discharge: 2022-05-16 | Disposition: A | Discharge status: Home / Self Care | Payer: Medicare Other | Source: Ambulatory Visit | Attending: Radiation Oncology | Admitting: Radiation Oncology

## 2022-05-16 ENCOUNTER — Other Ambulatory Visit: Payer: Self-pay

## 2022-05-16 DIAGNOSIS — D0511 Intraductal carcinoma in situ of right breast: Secondary | ICD-10-CM | POA: Diagnosis not present

## 2022-05-16 DIAGNOSIS — Z510 Encounter for antineoplastic radiation therapy: Secondary | ICD-10-CM | POA: Diagnosis not present

## 2022-05-16 DIAGNOSIS — Z170 Estrogen receptor positive status [ER+]: Secondary | ICD-10-CM | POA: Diagnosis not present

## 2022-05-16 LAB — RAD ONC ARIA SESSION SUMMARY
Course Elapsed Days: 16
Plan Fractions Treated to Date: 13
Plan Prescribed Dose Per Fraction: 2.66 Gy
Plan Total Fractions Prescribed: 16
Plan Total Prescribed Dose: 42.56 Gy
Reference Point Dosage Given to Date: 34.58 Gy
Reference Point Session Dosage Given: 2.66 Gy
Session Number: 13

## 2022-05-16 MED ORDER — TRAMADOL HCL 50 MG PO TABS: 50.0000 mg | ORAL_TABLET | Freq: Four times a day (QID) | ORAL | 0 refills | Status: DC | PRN

## 2022-05-19 ENCOUNTER — Ambulatory Visit
Admission: RE | Admit: 2022-05-19 | Discharge: 2022-05-19 | Disposition: A | Discharge status: Home / Self Care | Payer: Medicare Other | Source: Ambulatory Visit | Attending: Radiation Oncology | Admitting: Radiation Oncology

## 2022-05-19 ENCOUNTER — Other Ambulatory Visit: Payer: Self-pay

## 2022-05-19 DIAGNOSIS — Z510 Encounter for antineoplastic radiation therapy: Secondary | ICD-10-CM | POA: Diagnosis not present

## 2022-05-19 DIAGNOSIS — D0511 Intraductal carcinoma in situ of right breast: Secondary | ICD-10-CM | POA: Diagnosis not present

## 2022-05-19 DIAGNOSIS — Z170 Estrogen receptor positive status [ER+]: Secondary | ICD-10-CM | POA: Diagnosis not present

## 2022-05-19 LAB — RAD ONC ARIA SESSION SUMMARY
Course Elapsed Days: 19
Plan Fractions Treated to Date: 14
Plan Prescribed Dose Per Fraction: 2.66 Gy
Plan Total Fractions Prescribed: 16
Plan Total Prescribed Dose: 42.56 Gy
Reference Point Dosage Given to Date: 37.24 Gy
Reference Point Session Dosage Given: 2.66 Gy
Session Number: 14

## 2022-05-20 ENCOUNTER — Other Ambulatory Visit: Payer: Self-pay

## 2022-05-20 ENCOUNTER — Ambulatory Visit
Admission: RE | Admit: 2022-05-20 | Discharge: 2022-05-20 | Disposition: A | Discharge status: Home / Self Care | Payer: Medicare Other | Source: Ambulatory Visit | Attending: Radiation Oncology | Admitting: Radiation Oncology

## 2022-05-20 DIAGNOSIS — Z510 Encounter for antineoplastic radiation therapy: Secondary | ICD-10-CM | POA: Diagnosis not present

## 2022-05-20 DIAGNOSIS — D0511 Intraductal carcinoma in situ of right breast: Secondary | ICD-10-CM | POA: Diagnosis not present

## 2022-05-20 DIAGNOSIS — Z170 Estrogen receptor positive status [ER+]: Secondary | ICD-10-CM | POA: Diagnosis not present

## 2022-05-20 LAB — RAD ONC ARIA SESSION SUMMARY
Course Elapsed Days: 20
Plan Fractions Treated to Date: 15
Plan Prescribed Dose Per Fraction: 2.66 Gy
Plan Total Fractions Prescribed: 16
Plan Total Prescribed Dose: 42.56 Gy
Reference Point Dosage Given to Date: 39.9 Gy
Reference Point Session Dosage Given: 2.66 Gy
Session Number: 15

## 2022-05-21 ENCOUNTER — Other Ambulatory Visit: Payer: Self-pay

## 2022-05-21 ENCOUNTER — Ambulatory Visit
Admission: RE | Admit: 2022-05-21 | Discharge: 2022-05-21 | Disposition: A | Discharge status: Home / Self Care | Payer: Medicare Other | Source: Ambulatory Visit | Attending: Radiation Oncology | Admitting: Radiation Oncology

## 2022-05-21 DIAGNOSIS — D0511 Intraductal carcinoma in situ of right breast: Secondary | ICD-10-CM | POA: Diagnosis not present

## 2022-05-21 DIAGNOSIS — Z510 Encounter for antineoplastic radiation therapy: Secondary | ICD-10-CM | POA: Diagnosis not present

## 2022-05-21 DIAGNOSIS — Z170 Estrogen receptor positive status [ER+]: Secondary | ICD-10-CM | POA: Diagnosis not present

## 2022-05-21 LAB — RAD ONC ARIA SESSION SUMMARY
Course Elapsed Days: 21
Plan Fractions Treated to Date: 16
Plan Prescribed Dose Per Fraction: 2.66 Gy
Plan Total Fractions Prescribed: 16
Plan Total Prescribed Dose: 42.56 Gy
Reference Point Dosage Given to Date: 42.56 Gy
Reference Point Session Dosage Given: 2.66 Gy
Session Number: 16

## 2022-05-22 ENCOUNTER — Ambulatory Visit
Admission: RE | Admit: 2022-05-22 | Discharge: 2022-05-22 | Disposition: A | Discharge status: Home / Self Care | Payer: Medicare Other | Source: Ambulatory Visit | Attending: Radiation Oncology | Admitting: Radiation Oncology

## 2022-05-22 ENCOUNTER — Other Ambulatory Visit: Payer: Self-pay

## 2022-05-22 DIAGNOSIS — Z510 Encounter for antineoplastic radiation therapy: Secondary | ICD-10-CM | POA: Diagnosis not present

## 2022-05-22 DIAGNOSIS — Z170 Estrogen receptor positive status [ER+]: Secondary | ICD-10-CM | POA: Diagnosis not present

## 2022-05-22 DIAGNOSIS — D0511 Intraductal carcinoma in situ of right breast: Secondary | ICD-10-CM | POA: Diagnosis not present

## 2022-05-22 LAB — RAD ONC ARIA SESSION SUMMARY
Course Elapsed Days: 22
Plan Fractions Treated to Date: 1
Plan Prescribed Dose Per Fraction: 2 Gy
Plan Total Fractions Prescribed: 4
Plan Total Prescribed Dose: 8 Gy
Reference Point Dosage Given to Date: 2 Gy
Reference Point Session Dosage Given: 2 Gy
Session Number: 17

## 2022-05-23 ENCOUNTER — Ambulatory Visit
Admission: RE | Admit: 2022-05-23 | Discharge: 2022-05-23 | Disposition: A | Discharge status: Home / Self Care | Payer: Medicare Other | Source: Ambulatory Visit | Attending: Radiation Oncology | Admitting: Radiation Oncology

## 2022-05-23 ENCOUNTER — Other Ambulatory Visit: Payer: Self-pay

## 2022-05-23 DIAGNOSIS — D0511 Intraductal carcinoma in situ of right breast: Secondary | ICD-10-CM | POA: Diagnosis not present

## 2022-05-23 DIAGNOSIS — Z510 Encounter for antineoplastic radiation therapy: Secondary | ICD-10-CM | POA: Diagnosis not present

## 2022-05-23 LAB — RAD ONC ARIA SESSION SUMMARY
Course Elapsed Days: 23
Plan Fractions Treated to Date: 2
Plan Prescribed Dose Per Fraction: 2 Gy
Plan Total Fractions Prescribed: 4
Plan Total Prescribed Dose: 8 Gy
Reference Point Dosage Given to Date: 4 Gy
Reference Point Session Dosage Given: 2 Gy
Session Number: 18

## 2022-05-26 ENCOUNTER — Ambulatory Visit: Payer: Medicare Other

## 2022-05-26 ENCOUNTER — Other Ambulatory Visit: Payer: Self-pay

## 2022-05-26 ENCOUNTER — Ambulatory Visit
Admission: RE | Admit: 2022-05-26 | Discharge: 2022-05-26 | Disposition: A | Discharge status: Home / Self Care | Payer: Medicare Other | Source: Ambulatory Visit | Attending: Radiation Oncology | Admitting: Radiation Oncology

## 2022-05-26 ENCOUNTER — Encounter: Payer: Self-pay | Admitting: *Deleted

## 2022-05-26 DIAGNOSIS — Z510 Encounter for antineoplastic radiation therapy: Secondary | ICD-10-CM | POA: Diagnosis not present

## 2022-05-26 DIAGNOSIS — D0511 Intraductal carcinoma in situ of right breast: Secondary | ICD-10-CM

## 2022-05-26 LAB — RAD ONC ARIA SESSION SUMMARY
Course Elapsed Days: 26
Plan Fractions Treated to Date: 3
Plan Prescribed Dose Per Fraction: 2 Gy
Plan Total Fractions Prescribed: 4
Plan Total Prescribed Dose: 8 Gy
Reference Point Dosage Given to Date: 6 Gy
Reference Point Session Dosage Given: 2 Gy
Session Number: 19

## 2022-05-27 ENCOUNTER — Encounter: Payer: Self-pay | Admitting: Radiation Oncology

## 2022-05-27 ENCOUNTER — Ambulatory Visit
Admission: RE | Admit: 2022-05-27 | Discharge: 2022-05-27 | Disposition: A | Discharge status: Home / Self Care | Payer: Medicare Other | Source: Ambulatory Visit | Attending: Radiation Oncology | Admitting: Radiation Oncology

## 2022-05-27 ENCOUNTER — Other Ambulatory Visit: Payer: Self-pay

## 2022-05-27 DIAGNOSIS — D0511 Intraductal carcinoma in situ of right breast: Secondary | ICD-10-CM | POA: Diagnosis not present

## 2022-05-27 DIAGNOSIS — Z170 Estrogen receptor positive status [ER+]: Secondary | ICD-10-CM | POA: Diagnosis not present

## 2022-05-27 DIAGNOSIS — Z510 Encounter for antineoplastic radiation therapy: Secondary | ICD-10-CM | POA: Diagnosis not present

## 2022-05-27 LAB — RAD ONC ARIA SESSION SUMMARY
Course Elapsed Days: 27
Plan Fractions Treated to Date: 4
Plan Prescribed Dose Per Fraction: 2 Gy
Plan Total Fractions Prescribed: 4
Plan Total Prescribed Dose: 8 Gy
Reference Point Dosage Given to Date: 8 Gy
Reference Point Session Dosage Given: 2 Gy
Session Number: 20

## 2022-06-16 ENCOUNTER — Encounter: Payer: Self-pay | Admitting: Hematology and Oncology

## 2022-06-16 ENCOUNTER — Inpatient Hospital Stay: Payer: Medicare Other | Attending: Hematology and Oncology | Admitting: Hematology and Oncology

## 2022-06-16 ENCOUNTER — Other Ambulatory Visit: Payer: Self-pay

## 2022-06-16 VITALS — BP 175/70 | HR 61 | Temp 97.2°F | Resp 17 | Wt 294.4 lb

## 2022-06-16 DIAGNOSIS — Z8249 Family history of ischemic heart disease and other diseases of the circulatory system: Secondary | ICD-10-CM | POA: Insufficient documentation

## 2022-06-16 DIAGNOSIS — Z7901 Long term (current) use of anticoagulants: Secondary | ICD-10-CM | POA: Insufficient documentation

## 2022-06-16 DIAGNOSIS — Z86018 Personal history of other benign neoplasm: Secondary | ICD-10-CM | POA: Diagnosis not present

## 2022-06-16 DIAGNOSIS — N1831 Chronic kidney disease, stage 3a: Secondary | ICD-10-CM | POA: Diagnosis not present

## 2022-06-16 DIAGNOSIS — Z79811 Long term (current) use of aromatase inhibitors: Secondary | ICD-10-CM | POA: Diagnosis not present

## 2022-06-16 DIAGNOSIS — Z86711 Personal history of pulmonary embolism: Secondary | ICD-10-CM | POA: Insufficient documentation

## 2022-06-16 DIAGNOSIS — Z79899 Other long term (current) drug therapy: Secondary | ICD-10-CM | POA: Diagnosis not present

## 2022-06-16 DIAGNOSIS — E1122 Type 2 diabetes mellitus with diabetic chronic kidney disease: Secondary | ICD-10-CM | POA: Insufficient documentation

## 2022-06-16 DIAGNOSIS — Z8719 Personal history of other diseases of the digestive system: Secondary | ICD-10-CM | POA: Insufficient documentation

## 2022-06-16 DIAGNOSIS — E785 Hyperlipidemia, unspecified: Secondary | ICD-10-CM | POA: Diagnosis not present

## 2022-06-16 DIAGNOSIS — Z86718 Personal history of other venous thrombosis and embolism: Secondary | ICD-10-CM | POA: Insufficient documentation

## 2022-06-16 DIAGNOSIS — Z833 Family history of diabetes mellitus: Secondary | ICD-10-CM | POA: Diagnosis not present

## 2022-06-16 DIAGNOSIS — Z923 Personal history of irradiation: Secondary | ICD-10-CM | POA: Diagnosis not present

## 2022-06-16 DIAGNOSIS — D0511 Intraductal carcinoma in situ of right breast: Secondary | ICD-10-CM | POA: Insufficient documentation

## 2022-06-16 DIAGNOSIS — M069 Rheumatoid arthritis, unspecified: Secondary | ICD-10-CM | POA: Diagnosis not present

## 2022-06-16 DIAGNOSIS — I2510 Atherosclerotic heart disease of native coronary artery without angina pectoris: Secondary | ICD-10-CM | POA: Insufficient documentation

## 2022-06-16 DIAGNOSIS — Z8379 Family history of other diseases of the digestive system: Secondary | ICD-10-CM | POA: Diagnosis not present

## 2022-06-16 DIAGNOSIS — Z803 Family history of malignant neoplasm of breast: Secondary | ICD-10-CM | POA: Insufficient documentation

## 2022-06-16 DIAGNOSIS — I129 Hypertensive chronic kidney disease with stage 1 through stage 4 chronic kidney disease, or unspecified chronic kidney disease: Secondary | ICD-10-CM | POA: Diagnosis not present

## 2022-06-16 DIAGNOSIS — Z888 Allergy status to other drugs, medicaments and biological substances status: Secondary | ICD-10-CM | POA: Diagnosis not present

## 2022-06-16 MED ORDER — ANASTROZOLE 1 MG PO TABS: 1.0000 mg | ORAL_TABLET | Freq: Every day | ORAL | 3 refills | Status: DC

## 2022-06-16 NOTE — Progress Notes (Signed)
[name] NOTE  Patient Care Team: Erskine Emery, MD as PCP - General (Family Medicine) Marica Otter, [place] (Optometry) Ashok Pall, MD as Consulting Physician (Neurosurgery) Rosita Fire, MD as Consulting Physician (Nephrology) Leonie Man, MD as Consulting Physician (Cardiology) Erroll Luna, MD as Consulting Physician (General Surgery) Benay Pike, MD as Consulting Physician (Hematology and Oncology) Kyung Rudd, MD as Consulting Physician (Radiation Oncology) Mauro Kaufmann, RN as Oncology Nurse Navigator Rockwell Germany, RN as Oncology Nurse Navigator  CHIEF COMPLAINTS/PURPOSE OF CONSULTATION:  Newly diagnosed breast cancer s/p surgery  HISTORY OF PRESENTING ILLNESS:  Erin Coffey 76 y.o. female is here because of recent diagnosis of right breast DCIS  I reviewed her records extensively and collaborated the history with the patient.  SUMMARY OF ONCOLOGIC HISTORY: Oncology History  Ductal carcinoma in situ (DCIS) of right breast  01/20/2022 Mammogram   Digital screening mammogram with incomplete evaluation.  Right breast focal asymmetry indeterminate. Diagnostic mammogram and ultrasound showed no correlate.  Stereotactic biopsy was recommended   02/14/2022 Pathology Results   Pathology from the right breast needle core biopsy showed DCIS, low to intermediate nuclear grade, cribriform and micropapillary types without necrosis and foci suspicious for microinvasion.  Negative for microcalcifications.  DCIS measured 2.2 mm in greatest extent   03/03/2022 Initial Diagnosis   Ductal carcinoma in situ (DCIS) of right breast   03/25/2022 Surgery   She had a right breast lumpectomy which showed DCIS abutting the posterior margin of resection, additional breast tissue from posterior margin negative for DCIS.  No invasive carcinoma.  Prognostics from prior biopsy showed 100% ER positive and 100% PR positive.     She otherwise has  history of DVT and PE and continues on chronic anticoagulation, also has type 2 diabetes mellitus with previous hemoglobin A1c of 6.2, hypertension, dyslipidemia, coronary artery disease and rheumatoid arthritis.   She completed adjuvant radiation on 10/19. She tolerated it really well.  She had her last bone density in June 2022 which did not show any evidence of osteopenia or osteoporosis. She is ready to proceed with antiestrogen therapy.  Rest of the pertinent 10 point ROS reviewed and negative MEDICAL HISTORY:  Past Medical History:  Diagnosis Date   Allergy    Anemia, iron deficiency    ANXIETY, SITUATIONAL 04/06/2009   Qualifier: Diagnosis of  By: [name]  MD, Stephanie     Arthritis    Back pain, thoracic 12/20/2012   Carpal tunnel syndrome 12/07/2014   Cholelithiasis 06/25/2012   Chronic kidney disease    DM type 2 goal A1C below 7.5 06/05/2008   Qualifier: Diagnosis of  By: [name]  MD, Colletta [place]     Family history of breast cancer 03/05/2022   GASTROESOPHAGEAL REFLUX, NO ESOPHAGITIS 10/08/2006   Qualifier: Diagnosis of  By: Griffin Dakin, unspecified 10/21/2007   Qualifier: Diagnosis of  By: [name] MD, [name]     History of DVT (deep vein thrombosis) 05/05/2013   on Eliquis   HYPERLIPIDEMIA 08/08/2008   Qualifier: Diagnosis of  By: [name]  MD, Stephanie     HYPERTENSION, BENIGN ESSENTIAL 11/27/2006   Qualifier: Diagnosis of  ByHoy Morn MD, [name]     Mild coronary artery disease 10/2019   Coronary CT Angiogram: Coronary calcium score 46.Normal coronary origin.  Left dominance with tortuous coronary arteries.  Nondominant RCA - prox < 25% followed by ectasia (dilated to 5.5 mm) mixed plaque in distal vessel.  LM- normal .  LAD: minimal mixed plaque in ostial & proximal (<25%) with brief IM bridging in mLAD. Mild mid-distal (25-49%), RI - prox <25%, Large LCx- minimal (<25%) mid-dista   Mild coronary artery disease    Personal history of colonic polyps 10/24/2009   Qualifier:  Diagnosis of  By: [name] MD, Sandy Salaam    Pica 08/08/2009   Qualifier: Diagnosis of  By: [name], [name]     Pituitary adenoma [facility])    Polymyalgia rheumatica ([place]) 10/02/2009   Qualifier: Diagnosis of  By: [name] MD, Apolonio Schneiders     Pulmonary embolism [facility]) - On long Term anticoagulation (Z79.01) 04/28/2011   Converted from warfarin to Eliquis; on long-term anticoagulation.   Thyroid disease    Trigger ring finger of left hand 11/02/2014    SURGICAL HISTORY: Past Surgical History:  Procedure Laterality Date   ABDOMINAL HYSTERECTOMY     BREAST LUMPECTOMY WITH RADIOACTIVE SEED LOCALIZATION Right 03/25/2022   Procedure: RIGHT BREAST SEED LUMPECTOMY;  Surgeon: Erroll Luna, MD;  Location: [place];  Service: General;  Laterality: Right;   CARDIAC CATHETERIZATION  04/29/11   (Dr. Lia Foyer - [place]): LM - normal. LAD with 1 major Diag - normal, bifurcating Ramus - normal, Large (6-7 mm) Dominant LCx with 1 OM, 2 LPL branches & LPDA - non significant disease; small non-dominant RCA.     CARDIAC EVENT MONITOR  09/2019   Mostly sinus rhythm, average rate 60 bpm.  Range 45 to 190 bpm.  No critical events indicating any arrhythmias or pauses.  For patient responses noted PVCs.  Less than 1% PVCs noted.    CARPAL TUNNEL RELEASE Right 1996   COLONOSCOPY     KNEE ARTHROSCOPY  Bilateral   TRANSTHORACIC ECHOCARDIOGRAM  09/2019   Normal EF - 60 to 65%.  Mild LVH.  GR 1 DD.  Normal RV.  Trivial MR.  Normal IVC.  Normal PA pressures.   TUBAL LIGATION     UPPER GASTROINTESTINAL ENDOSCOPY      SOCIAL HISTORY: Social History   Socioeconomic History   Marital status: Widowed    Spouse name: Not on file   Number of children: 3   Years of education: 10   Highest education level: 10th grade  Occupational History   Occupation: Retired- Airline pilot: [employer]  Tobacco Use   Smoking status: Never   Smokeless tobacco: Never  Scientific laboratory technician Use: Never used  Substance and  Sexual Activity   Alcohol use: No    Alcohol/week: 0.0 standard drinks of alcohol   Drug use: No   Sexual activity: Not Currently  Other Topics Concern   Not on file  Social History Narrative   Patient lives alone in [place], she is a widow.   Patient has 3 children. Two daughters live locally, one son in [place].    Patient is very active in her church and volunteer work to stay busy.    Patient enjoys cooking, spending time with family, and crossword puzzles.       Social Determinants of Health   Financial Resource Strain: Low Risk  (03/05/2022)   Overall Financial Resource Strain (CARDIA)    Difficulty of Paying Living Expenses: Not very hard  Food Insecurity: No Food Insecurity (03/05/2022)   Hunger Vital Sign    Worried About Running Out of Food in the Last Year: Never true    Ran Out of Food in the Last Year: Never true  Transportation Needs: No Transportation Needs (  03/05/2022)   PRAPARE - Hydrologist (Medical): No    Lack of Transportation (Non-Medical): No  Physical Activity: Sufficiently Active (03/03/2019)   Exercise Vital Sign    Days of Exercise per Week: 3 days    Minutes of Exercise per Session: 60 min  Stress: No Stress Concern Present (03/05/2022)   [place]    Feeling of Stress : Not at all  Social Connections: Moderately Integrated (01/05/2020)   Social Connection and Isolation Panel [NHANES]    Frequency of Communication with Friends and Family: More than three times a week    Frequency of Social Gatherings with Friends and Family: More than three times a week    Attends Religious Services: More than 4 times per year    Active Member of [employer] or Organizations: Yes    Attends Archivist Meetings: More than 4 times per year    Marital Status: Widowed  Intimate Partner Violence: Not At Risk (03/03/2019)   Humiliation, Afraid, Rape, and Kick questionnaire     Fear of Current or Ex-Partner: No    Emotionally Abused: No    Physically Abused: No    Sexually Abused: No    FAMILY HISTORY: Family History  Problem Relation Age of Onset   Diabetes Mother    Heart disease Mother    Hypertension Mother    Heart disease Father    Hypertension Father    Diabetes Sister    Heart disease Sister    Leukemia Sister 63   Cirrhosis Sister        alcohol   Kidney disease Sister    Diabetes Sister    Hypertension Brother    Diabetes Brother    Breast cancer Cousin        dx 52s; maternal female cousin   Colon cancer Neg Hx    Pancreatic cancer Neg Hx    Esophageal cancer Neg Hx     ALLERGIES:  is allergic to lisinopril.  MEDICATIONS:  Current Outpatient Medications  Medication Sig Dispense Refill   anastrozole (ARIMIDEX) 1 MG tablet Take 1 tablet (1 mg total) by mouth daily. 90 tablet 3   acetaminophen (TYLENOL) 650 MG CR tablet Take 1,300 mg by mouth every 8 (eight) hours as needed for pain.     amLODipine (NORVASC) 5 MG tablet TAKE 1 TABLET BY MOUTH AT  BEDTIME 90 tablet 3   atorvastatin (LIPITOR) 40 MG tablet TAKE 1 TABLET BY MOUTH  DAILY 100 tablet 2   baclofen (LIORESAL) 10 MG tablet Take 1 tablet (10 mg total) by mouth daily as needed for muscle spasms. 30 each 0   Blood Glucose Monitoring Suppl (ONETOUCH VERIO) w/Device KIT Use as instructed to test sugars once daily.  Dx Code: E11.9 1 kit 0   CHOLECALCIFEROL PO Take 2,200 Units by mouth daily.     ELIQUIS 5 MG TABS tablet TAKE 1 TABLET BY MOUTH  TWICE DAILY 180 tablet 3   fish oil-omega-3 fatty acids 1000 MG capsule Take 1 g by mouth daily.     gabapentin (NEURONTIN) 100 MG capsule TAKE 1 CAPSULE BY MOUTH TWICE  DAILY 180 capsule 3   glucose blood (ONETOUCH VERIO) test strip Use as instructed to test sugars once daily.  Dx Code: E11.9 100 each 12   iron polysaccharides (NIFEREX) 150 MG capsule      ketoconazole (NIZORAL) 2 % cream Apply to both feet once daily for  4 weeks. 30 g 1    Lancet Devices (ONE TOUCH DELICA LANCING DEV) MISC Use as instructed to test sugars once daily.  Dx Code: E11.9 1 each 0   losartan (COZAAR) 100 MG tablet Take 1 tablet (100 mg total) by mouth daily. 90 tablet 1   metoprolol tartrate (LOPRESSOR) 25 MG tablet TAKE 1 TABLET BY MOUTH  TWICE DAILY 200 tablet 2   omeprazole (PRILOSEC) 20 MG capsule Take 1 capsule (20 mg total) by mouth daily. 30 capsule 0   OneTouch Delica Lancets 50V MISC Use as instructed to test sugars once daily.  Dx Code: E11.9 100 each 12   polyethylene glycol powder (GLYCOLAX/MIRALAX) powder Take 17 g by mouth 2 (two) times daily as needed. (Patient taking differently: Take 17 g by mouth daily as needed (constipation).) 3350 g 1   traMADol (ULTRAM) 50 MG tablet Take 1 tablet (50 mg total) by mouth every 6 (six) hours as needed. 30 tablet 0   vitamin C (ASCORBIC ACID) 500 MG tablet Take 500 mg by mouth daily.     No current facility-administered medications for this visit.    REVIEW OF SYSTEMS:   Constitutional: Denies fevers, chills or abnormal night sweats Eyes: Denies blurriness of vision, double vision or watery eyes Ears, nose, mouth, throat, and face: Denies mucositis or sore throat Respiratory: Denies cough, dyspnea or wheezes Cardiovascular: Denies palpitation, chest discomfort or lower extremity swelling Gastrointestinal:  Denies nausea, heartburn or change in bowel habits Skin: Denies abnormal skin rashes Lymphatics: Denies new lymphadenopathy or easy bruising Neurological:Denies numbness, tingling or new weaknesses Behavioral/Psych: Mood is stable, no new changes  Breast: Denies any palpable lumps or discharge All other systems were reviewed with the patient and are negative.  PHYSICAL EXAMINATION: ECOG PERFORMANCE STATUS: 0 - Asymptomatic  Vitals:   06/16/22 0857  BP: (!) 175/70  Pulse: 61  Resp: 17  Temp: (!) 97.2 F (36.2 C)  SpO2: 100%   Filed Weights   06/16/22 0857  Weight: 294 lb 7 oz (133.6  kg)    Physical Exam Constitutional:      Appearance: Normal appearance.  Chest:     Comments: Right breast with recent radiation changes.  No other concerns Neurological:     Mental Status: She is alert.       LABORATORY DATA:  I have reviewed the data as listed Lab Results  Component Value Date   WBC 4.8 03/05/2022   HGB 11.2 (L) 03/05/2022   HCT 34.3 (L) 03/05/2022   MCV 91.5 03/05/2022   PLT 189 03/05/2022   Lab Results  Component Value Date   NA 139 03/05/2022   K 4.4 03/05/2022   CL 109 03/05/2022   CO2 24 03/05/2022    RADIOGRAPHIC STUDIES: I have personally reviewed the radiological reports and agreed with the findings in the report.  ASSESSMENT AND PLAN:  Ductal carcinoma in situ (DCIS) of right breast This is a 77 year old postmenopausal female patient with right breast DCIS ER/PR positive initially referred to breast [place] for recommendations.  She is status postlumpectomy which showed ductal carcinoma in situ, negative margins.  She completed adjuvant radiation on October 19.  Physical examination today without any major concerns.  We have once again reviewed role of antiestrogen therapy today.  Given her history of blood clot, we have discussed about proceeding with aromatase inhibitors.  I have once again discussed about adverse effects including but not limited to hot flashes, vaginal dryness, arthralgias, bone loss etc.  Most recent bone density in June 2022 without any evidence of osteopenia or osteoporosis.  At this time we have discussed that it is not necessary for her to continue Fosamax unless otherwise recommended by her primary care physician.  She understands however that the bone density can worsen while on anastrozole.  She will return to clinic in about 3 months with survivorship clinic and 6 months with me.    Thank you for consulting [name] the care of this patient.  Please do not hesitate to contact us with any additional questions or  concerns.   Total time spent: 30 minutes including history, physical, review of records, counseling and coordination of care All questions were answered. The patient knows to call the clinic with any problems, questions or concerns.    Benay Pike, MD 06/16/22

## 2022-06-16 NOTE — Assessment & Plan Note (Signed)
This is a 76 year old postmenopausal female patient with right breast DCIS ER/PR positive initially referred to breast [place] for recommendations.  She is status postlumpectomy which showed ductal carcinoma in situ, negative margins.  She completed adjuvant radiation on October 19.  Physical examination today without any major concerns.  We have once again reviewed role of antiestrogen therapy today.  Given her history of blood clot, we have discussed about proceeding with aromatase inhibitors.  I have once again discussed about adverse effects including but not limited to hot flashes, vaginal dryness, arthralgias, bone loss etc.  Most recent bone density in June 2022 without any evidence of osteopenia or osteoporosis.  At this time we have discussed that it is not necessary for her to continue Fosamax unless otherwise recommended by her primary care physician.  She understands however that the bone density can worsen while on anastrozole.  She will return to clinic in about 3 months with survivorship clinic and 6 months with me.    Thank you for consulting [name] the care of this patient.  Please do not hesitate to contact us with any additional questions or concerns.

## 2022-06-17 NOTE — Progress Notes (Signed)
                                                                                                                                                             Patient Name: Erin Coffey MRN: [id number] DOB: [birth year] Referring Physician: Erskine Emery Date of Service: 05/27/2022 [facility], [place]                                                        End Of Treatment Note  Diagnoses: D05.11-Intraductal carcinoma in situ of right breast  Cancer Staging:   Intermediate grade, ER/PR positive DCIS of the right breast   Intent: Curative  Radiation Treatment Dates: 04/30/2022 through 05/27/2022 Site Technique Total Dose (Gy) Dose per Fx (Gy) Completed Fx Beam Energies  Breast, Right: Breast_R 3D 42.56/42.56 2.66 16/16 15X, 10XFFF  Breast, Right: Breast_R_Bst 3D 8/8 2 4/4 10X   Narrative: The patient tolerated radiation therapy relatively well. She developed anticipated skin changes in the treatment field.   Plan: The patient will receive a call in about one month from the radiation oncology department. She will continue follow up with Dr. Burr Medico as well.   ________________________________________________    Carola Rhine, [facility]

## 2022-07-14 ENCOUNTER — Ambulatory Visit
Admission: RE | Admit: 2022-07-14 | Discharge: 2022-07-14 | Disposition: A | Discharge status: Home / Self Care | Payer: Medicare Other | Source: Ambulatory Visit | Attending: Adult Health | Admitting: Adult Health

## 2022-07-14 NOTE — Progress Notes (Signed)
  Radiation Oncology         [phone] ________________________________  Name: Erin Coffey MRN: [id number]  Date of Service: 07/14/2022  DOB: 1945/11/06  Post Treatment Telephone Note  Diagnosis:  Intermediate grade, ER/PR positive DCIS of the right breast   Intent: Curative  Radiation Treatment Dates: 04/30/2022 through 05/27/2022 Site Technique Total Dose (Gy) Dose per Fx (Gy) Completed Fx Beam Energies  Breast, Right: Breast_R 3D 42.56/42.56 2.66 16/16 15X, 10XFFF  Breast, Right: Breast_R_Bst 3D 8/8 2 4/4 10X  (as documented in provider EOT note)   The patient was available for call today.   Symptoms of fatigue have improved since completing therapy.  Symptoms of skin changes have improved since completing therapy.  The patient was encouraged to avoid sun exposure in the area of prior treatment for up to one year following radiation with either sunscreen or by the style of clothing worn in the sun.  The patient has scheduled follow up with her medical oncologist Dr. Burr Medico for ongoing surveillance, and was encouraged to call if she develops concerns or questions regarding radiation.  This concludes the interview.   Leandra Kern, LPN

## 2022-07-15 NOTE — Progress Notes (Signed)
[facility] Quality Team Note  Name: Erin Coffey Date of Birth: Sep 19, 1945 MRN: [id number] Date: 07/15/2022  [facility] Quality Team has reviewed this patient's chart, please see recommendations below:  [facility] Quality Other; (KED GAP- PATIENT HAS eGFR BUT NEEDS URINE MICROALBUMIN/CREATININE RATIO TEST COMPLETED TO CLOSE THE GAP FOR 2023.)

## 2022-07-27 ENCOUNTER — Other Ambulatory Visit: Payer: Self-pay | Admitting: Family Medicine

## 2022-07-27 DIAGNOSIS — E1140 Type 2 diabetes mellitus with diabetic neuropathy, unspecified: Secondary | ICD-10-CM

## 2022-08-13 ENCOUNTER — Ambulatory Visit: Payer: Medicare Other | Admitting: Student

## 2022-08-13 NOTE — Progress Notes (Deleted)
  SUBJECTIVE:   CHIEF COMPLAINT / HPI:   ***  PERTINENT  PMH / PSH: ***  Past Medical History:  Diagnosis Date   Allergy    Anemia, iron deficiency    ANXIETY, SITUATIONAL 04/06/2009   Qualifier: Diagnosis of  By: [name]  MD, Stephanie     Arthritis    Back pain, thoracic 12/20/2012   Carpal tunnel syndrome 12/07/2014   Cholelithiasis 06/25/2012   Chronic kidney disease    DM type 2 goal A1C below 7.5 06/05/2008   Qualifier: Diagnosis of  By: [name]  MD, Colletta [place]     Family history of breast cancer 03/05/2022   GASTROESOPHAGEAL REFLUX, NO ESOPHAGITIS 10/08/2006   Qualifier: Diagnosis of  By: Griffin Dakin, unspecified 10/21/2007   Qualifier: Diagnosis of  By: [name] MD, [name]     History of DVT (deep vein thrombosis) 05/05/2013   on Eliquis   HYPERLIPIDEMIA 08/08/2008   Qualifier: Diagnosis of  By: [name]  MD, Stephanie     HYPERTENSION, BENIGN ESSENTIAL 11/27/2006   Qualifier: Diagnosis of  ByHoy Morn MD, [name]     Mild coronary artery disease 10/2019   Coronary CT Angiogram: Coronary calcium score 46.Normal coronary origin.  Left dominance with tortuous coronary arteries.  Nondominant RCA - prox < 25% followed by ectasia (dilated to 5.5 mm) mixed plaque in distal vessel.  LM- normal . LAD: minimal mixed plaque in ostial & proximal (<25%) with brief IM bridging in mLAD. Mild mid-distal (25-49%), RI - prox <25%, Large LCx- minimal (<25%) mid-dista   Mild coronary artery disease    Personal history of colonic polyps 10/24/2009   Qualifier: Diagnosis of  By: [name] MD, Sandy Salaam    P[place] 08/08/2009   Qualifier: Diagnosis of  By: [name], [name]     Pituitary adenoma [facility])    Polymyalgia rheumatica ([place]) 10/02/2009   Qualifier: Diagnosis of  By: [name] MD, Apolonio Schneiders     Pulmonary embolism [facility]) - On long Term anticoagulation (Z79.01) 04/28/2011   Converted from warfarin to Eliquis; on long-term anticoagulation.   Thyroid disease    Trigger ring finger of left hand 11/02/2014     OBJECTIVE:  There were no vitals taken for this visit. Physical Exam   ASSESSMENT/PLAN:  There are no diagnoses linked to this encounter. No follow-ups on file.  - Consider collection of annual UACR  - Consider switching patient from metoprolol '25mg'$  BID to carvedilol 12.'5mg'$  BID for additional blood pressure control  - Consider SGLT2 initiation for CKD treatment  - Consider pharmacy clinic referral for follow-up of elevated blood pressure / medication review   Erskine Emery, MD 08/13/2022, 1:00 PM PGY-2, [place] {    This will disappear when note is signed, click to select method of visit    :1}

## 2022-08-22 ENCOUNTER — Other Ambulatory Visit: Payer: Self-pay | Admitting: Student

## 2022-08-22 DIAGNOSIS — G8929 Other chronic pain: Secondary | ICD-10-CM

## 2022-08-22 DIAGNOSIS — I2699 Other pulmonary embolism without acute cor pulmonale: Secondary | ICD-10-CM

## 2022-08-23 MED ORDER — APIXABAN 5 MG PO TABS: 5.0000 mg | ORAL_TABLET | Freq: Two times a day (BID) | ORAL | 3 refills | Status: DC

## 2022-08-26 ENCOUNTER — Telehealth: Payer: Self-pay

## 2022-08-26 NOTE — Patient Instructions (Signed)
Visit Information  Thank you for taking time to visit with me today. Please don't hesitate to contact me if I can be of assistance to you.   Following are the goals we discussed today:   Goals Addressed             This Visit's Progress    COMPLETED: Care Coordination Activities-No follow up required       Care Coordination Interventions: Advised patient to Annual Wellness exam. Discussed [facility] services and support. Assessed SDOH. Advised to discuss with primary care physician if services needed in the future.           If you are experiencing a Mental Health or [place] or need someone to talk to, please call the Suicide and Crisis Lifeline: 988   Patient verbalizes understanding of instructions and care plan provided today and agrees to view in [place]. Active MyChart status and patient understanding of how to access instructions and care plan via MyChart confirmed with patient.     No further follow up required: declined  Jone Baseman, RN, MSN [place] [facility] Care Management Coordinator Direct Line [phone]

## 2022-08-26 NOTE — Patient Outreach (Signed)
  Care Coordination   Initial Visit Note   08/26/2022 Name: Erin Coffey MRN: [id number] DOB: 13-May-1946  Erin Coffey is a 76 y.o. year old female who sees Erin Emery, MD for primary care. I spoke with  Erin Coffey by phone today.  What matters to the patients health and wellness today?  none    Goals Addressed             This Visit's Progress    COMPLETED: Care Coordination Activities-No follow up required       Care Coordination Interventions: Advised patient to Annual Wellness exam. Discussed [facility] services and support. Assessed SDOH. Advised to discuss with primary care physician if services needed in the future.          SDOH assessments and interventions completed:  Yes  SDOH Interventions Today    Flowsheet Row Most Recent Value  SDOH Interventions   Housing Interventions Intervention Not Indicated  Utilities Interventions Intervention Not Indicated        Care Coordination Interventions:  Yes, provided   Follow up plan: No further intervention required.   Encounter Outcome:  Pt. Visit Completed   Erin Baseman, RN, MSN [place] Management Care Management Coordinator Direct Line [phone]

## 2022-08-29 ENCOUNTER — Encounter: Payer: Self-pay | Admitting: Student

## 2022-08-29 ENCOUNTER — Ambulatory Visit (INDEPENDENT_AMBULATORY_CARE_PROVIDER_SITE_OTHER): Payer: Medicare Other | Admitting: Student

## 2022-08-29 VITALS — BP 124/78 | HR 60 | Ht 68.0 in | Wt 291.0 lb

## 2022-08-29 DIAGNOSIS — E119 Type 2 diabetes mellitus without complications: Secondary | ICD-10-CM | POA: Diagnosis not present

## 2022-08-29 DIAGNOSIS — D649 Anemia, unspecified: Secondary | ICD-10-CM

## 2022-08-29 DIAGNOSIS — I10 Essential (primary) hypertension: Secondary | ICD-10-CM

## 2022-08-29 LAB — POCT GLYCOSYLATED HEMOGLOBIN (HGB A1C): HbA1c, POC (controlled diabetic range): 6.1 % (ref 0.0–7.0)

## 2022-08-29 NOTE — Progress Notes (Signed)
  SUBJECTIVE:   CHIEF COMPLAINT / HPI:   Type 2 Diabetes: Home medications include: None. Doing well.   Most recent A1Cs:  Lab Results  Component Value Date   HGBA1C 6.1 08/29/2022   HGBA1C 6.2 05/12/2022   Last Microalbumin, LDL, Creatinine: Lab Results  Component Value Date   LDLCALC 82 09/03/2021   CREATININE 1.38 (H) 03/05/2022    Patient is up to date on diabetic eye. Patient is up to date on diabetic foot exam.  Hypertension: BP: 124/78 today. Home medications include: Amlodipine 5 mg, Metoprolol 25 mg, losartan '100mg'$ . She endorses taking these medications as prescribed. Does not check blood pressure at home. Most recent creatinine trend:  Lab Results  Component Value Date   CREATININE 1.38 (H) 03/05/2022   CREATININE 1.26 (H) 01/17/2022   CREATININE 1.30 (H) 09/03/2021   Patient has had a BMP in the past 1 year.   CKD following [facility] with proteinuria. She sees nephrology on 09/18/2022.   Anemia: Last ferritin level 6 years ago.   PERTINENT  PMH / PSH: H/o PE, CAD, HTN, GERD, diverticulosis, DM2, HLD, osteoporosis, CKD DCIS of right breast     OBJECTIVE:  BP 124/78   Pulse 60   Ht '5\' 8"'$  (1.727 m)   Wt 291 lb (132 kg)   SpO2 100%   BMI 44.25 kg/m  Physical Exam  General: Alert and oriented in no apparent distress; very pleasant AAF Heart: Regular rate and rhythm with no murmurs appreciated Lungs: CTA bilaterally, no wheezing Abdomen: no abdominal pain Skin: Warm and dry   ASSESSMENT/PLAN:  Type 2 diabetes mellitus with hemoglobin A1c goal of less than 7.5% (HCC) Assessment & Plan: well controlled - Last A1c:  Lab Results  Component Value Date   HGBA1C 6.1 08/29/2022   - Medications: None - Compliance: Great! -Eye and foot exam UTD    Orders: -     POCT glycosylated hemoglobin (Hb A1C) -     Microalbumin / creatinine urine ratio  Anemia, unspecified type Assessment & Plan: Chronic and last ferritin wnl 6 yrs ago at 33.  Likely CKD related. No active bleeding, UTD on colonoscopy.    HYPERTENSION, BENIGN ESSENTIAL Assessment & Plan: BP: 124/78 today. Well controlled. Goal of <130/80. Continue to work on healthy dietary habits and exercise. Follow up in 4 months.   Medication regimen: Amlodipine 5 mg, Metoprolol 25 mg, losartan '100mg'$      Return in about 4 months (around 12/28/2022) for DM2, HTN . Erskine Emery, MD 08/29/2022, 8:52 AM PGY-2, [place]

## 2022-08-29 NOTE — Patient Instructions (Addendum)
It was great to see you today! Thank you for choosing [facility] for your primary care. Erin Coffey was seen for follow up.  Today we addressed: Keep taking your regular medications  We will check your urine and I will likely start you on a medication called Wilder Glade for kidney protection Call with any concerns   If you haven't already, sign up for My Chart to have easy access to your labs results, and communication with your primary care physician.  We are checking some labs today. If they are abnormal, I will call you. If they are normal, I will send you a MyChart message (if it is active) or a letter in the mail. If you do not hear about your labs in the next 2 weeks, please call the office. I recommend that you always bring your medications to each appointment as this makes it easy to ensure you are on the correct medications and helps [name] not miss refills when you need them. Call the clinic at [phone] if your symptoms worsen or you have any concerns.  You should return to our clinic Return in about 4 months (around 12/28/2022) for DM2, HTN . Please arrive 15 minutes before your appointment to ensure smooth check in process.  We appreciate your efforts in making this happen.  Thank you for allowing me to participate in your care, Erskine Emery, MD 08/29/2022, 8:46 AM PGY-2, [place]

## 2022-08-29 NOTE — Assessment & Plan Note (Addendum)
well controlled - Last A1c:  Lab Results  Component Value Date   HGBA1C 6.1 08/29/2022   - Medications: None - Compliance: Great! -Eye and foot exam UTD

## 2022-08-29 NOTE — Assessment & Plan Note (Signed)
Chronic and last ferritin wnl 6 yrs ago at 81. Likely CKD related. No active bleeding, UTD on colonoscopy.

## 2022-08-29 NOTE — Assessment & Plan Note (Signed)
BP: 124/78 today. Well controlled. Goal of <130/80. Continue to work on healthy dietary habits and exercise. Follow up in 4 months.   Medication regimen: Amlodipine 5 mg, Metoprolol 25 mg, losartan '100mg'$ 

## 2022-08-31 LAB — MICROALBUMIN / CREATININE URINE RATIO
Creatinine, Urine: 50.2 mg/dL
Microalb/Creat Ratio: 494 mg/g creat — ABNORMAL HIGH (ref 0–29)
Microalbumin, Urine: 248 ug/mL

## 2022-09-01 ENCOUNTER — Other Ambulatory Visit: Payer: Self-pay | Admitting: Student

## 2022-09-01 ENCOUNTER — Telehealth: Payer: Self-pay

## 2022-09-01 DIAGNOSIS — R801 Persistent proteinuria, unspecified: Secondary | ICD-10-CM

## 2022-09-01 MED ORDER — DAPAGLIFLOZIN PROPANEDIOL 10 MG PO TABS: 10.0000 mg | ORAL_TABLET | Freq: Every day | ORAL | 0 refills | Status: DC

## 2022-09-01 NOTE — Telephone Encounter (Signed)
Patient calls nurse line regarding cost of [place]. Reports the cost is $141. She wants to make sure that her kidney doctor is also okay with her taking this prior to picking up prescription. Patient wanted to know if PCP had discussed medication with provider at [facility].   Please advise.   Talbot Grumbling, RN

## 2022-09-02 ENCOUNTER — Telehealth: Payer: Self-pay

## 2022-09-02 NOTE — Telephone Encounter (Signed)
Patient calls nurse line requesting an antibiotic for a UTI.  She reports urinary frequency, flank pain, dysuria and odor. She reports symptoms started over the weekend.   She denies any abdominal pain, nausea, fever or chills.   She reports she was "just here" on Friday and would prefer to not come in again. She reports some of her medications from her Oncologist can cause a UTI.  She reports she has been drinking cranberry juice and water.   Will forward to PCP for advisement.

## 2022-09-03 NOTE — Telephone Encounter (Signed)
Scheduled pt with Espinoza on 09/09/2022 '@2'$ :30 pm

## 2022-09-04 ENCOUNTER — Other Ambulatory Visit (HOSPITAL_COMMUNITY): Payer: Self-pay

## 2022-09-08 DIAGNOSIS — N189 Chronic kidney disease, unspecified: Secondary | ICD-10-CM | POA: Diagnosis not present

## 2022-09-08 DIAGNOSIS — N183 Chronic kidney disease, stage 3 unspecified: Secondary | ICD-10-CM | POA: Diagnosis not present

## 2022-09-09 ENCOUNTER — Ambulatory Visit (HOSPITAL_COMMUNITY)
Admission: EM | Admit: 2022-09-09 | Discharge: 2022-09-09 | Disposition: A | Discharge status: Home / Self Care | Payer: Medicare Other | Attending: Family Medicine | Admitting: Family Medicine

## 2022-09-09 ENCOUNTER — Encounter (HOSPITAL_COMMUNITY): Payer: Self-pay

## 2022-09-09 ENCOUNTER — Ambulatory Visit: Payer: Medicare Other | Admitting: Family Medicine

## 2022-09-09 DIAGNOSIS — R509 Fever, unspecified: Secondary | ICD-10-CM | POA: Insufficient documentation

## 2022-09-09 DIAGNOSIS — N10 Acute tubulo-interstitial nephritis: Secondary | ICD-10-CM | POA: Diagnosis not present

## 2022-09-09 LAB — POC INFLUENZA A AND B ANTIGEN (URGENT CARE ONLY)
INFLUENZA A ANTIGEN, POC: NEGATIVE
INFLUENZA B ANTIGEN, POC: NEGATIVE

## 2022-09-09 LAB — CBC WITH DIFFERENTIAL/PLATELET
Abs Immature Granulocytes: 0.03 10*3/uL (ref 0.00–0.07)
Basophils Absolute: 0 10*3/uL (ref 0.0–0.1)
Basophils Relative: 0 %
Eosinophils Absolute: 0 10*3/uL (ref 0.0–0.5)
Eosinophils Relative: 1 %
HCT: 36.2 % (ref 36.0–46.0)
Hemoglobin: 11.8 g/dL — ABNORMAL LOW (ref 12.0–15.0)
Immature Granulocytes: 1 %
Lymphocytes Relative: 25 %
Lymphs Abs: 1.2 10*3/uL (ref 0.7–4.0)
MCH: 29.4 pg (ref 26.0–34.0)
MCHC: 32.6 g/dL (ref 30.0–36.0)
MCV: 90.3 fL (ref 80.0–100.0)
Monocytes Absolute: 0.6 10*3/uL (ref 0.1–1.0)
Monocytes Relative: 12 %
Neutro Abs: 2.9 10*3/uL (ref 1.7–7.7)
Neutrophils Relative %: 61 %
Platelets: 229 10*3/uL (ref 150–400)
RBC: 4.01 MIL/uL (ref 3.87–5.11)
RDW: 15.3 % (ref 11.5–15.5)
WBC: 4.8 10*3/uL (ref 4.0–10.5)
nRBC: 0 % (ref 0.0–0.2)

## 2022-09-09 LAB — COMPREHENSIVE METABOLIC PANEL
ALT: 14 U/L (ref 0–44)
AST: 29 U/L (ref 15–41)
Albumin: 3.5 g/dL (ref 3.5–5.0)
Alkaline Phosphatase: 57 U/L (ref 38–126)
Anion gap: 13 (ref 5–15)
BUN: 15 mg/dL (ref 8–23)
CO2: 23 mmol/L (ref 22–32)
Calcium: 9.5 mg/dL (ref 8.9–10.3)
Chloride: 98 mmol/L (ref 98–111)
Creatinine, Ser: 1.46 mg/dL — ABNORMAL HIGH (ref 0.44–1.00)
GFR, Estimated: 38 mL/min — ABNORMAL LOW (ref >60)
Glucose, Bld: 100 mg/dL — ABNORMAL HIGH (ref 70–99)
Potassium: 4.3 mmol/L (ref 3.5–5.1)
Sodium: 134 mmol/L — ABNORMAL LOW (ref 135–145)
Total Bilirubin: 0.9 mg/dL (ref 0.3–1.2)
Total Protein: 8.6 g/dL — ABNORMAL HIGH (ref 6.5–8.1)

## 2022-09-09 LAB — CBG MONITORING, ED: Glucose-Capillary: 115 mg/dL — ABNORMAL HIGH (ref 70–99)

## 2022-09-09 LAB — POCT URINALYSIS DIPSTICK, ED / UC
Bilirubin Urine: NEGATIVE
Glucose, UA: NEGATIVE mg/dL
Ketones, ur: NEGATIVE mg/dL
Nitrite: NEGATIVE
Protein, ur: >=300 mg/dL — AB
Specific Gravity, Urine: 1.02 (ref 1.005–1.030)
Urobilinogen, UA: 0.2 mg/dL (ref 0.0–1.0)
pH: 6 (ref 5.0–8.0)

## 2022-09-09 LAB — LIPASE, BLOOD: Lipase: 30 U/L (ref 11–51)

## 2022-09-09 MED ORDER — ONDANSETRON 4 MG PO TBDP: 4.0000 mg | ORAL_TABLET | Freq: Three times a day (TID) | ORAL | 0 refills | Status: DC | PRN

## 2022-09-09 MED ORDER — CEPHALEXIN 500 MG PO CAPS: 500.0000 mg | ORAL_CAPSULE | Freq: Two times a day (BID) | ORAL | 0 refills | Status: DC

## 2022-09-09 MED ORDER — ACETAMINOPHEN 325 MG PO TABS
650.0000 mg | ORAL_TABLET | Freq: Once | ORAL | Status: AC
  Administered 2022-09-09: 650 mg via ORAL

## 2022-09-09 MED ORDER — CEFTRIAXONE SODIUM 500 MG IJ SOLR
INTRAMUSCULAR | Status: AC
  Filled 2022-09-09: qty 500

## 2022-09-09 MED ORDER — CEFTRIAXONE SODIUM 1 G IJ SOLR
INTRAMUSCULAR | Status: AC
  Filled 2022-09-09: qty 10

## 2022-09-09 MED ORDER — ACETAMINOPHEN 325 MG PO TABS
ORAL_TABLET | ORAL | Status: AC
  Filled 2022-09-09: qty 2

## 2022-09-09 MED ORDER — CEFTRIAXONE SODIUM 1 G IJ SOLR
1.0000 g | Freq: Once | INTRAMUSCULAR | Status: AC
  Administered 2022-09-09: 1 g via INTRAMUSCULAR

## 2022-09-09 MED ORDER — LIDOCAINE HCL (PF) 1 % IJ SOLN
INTRAMUSCULAR | Status: AC
  Filled 2022-09-09: qty 2

## 2022-09-09 NOTE — Discharge Instructions (Addendum)
Meds ordered this encounter  Medications   acetaminophen (TYLENOL) tablet 650 mg   cefTRIAXone (ROCEPHIN) injection 1 g   cephALEXin (KEFLEX) 500 MG capsule    Sig: Take 1 capsule (500 mg total) by mouth 2 (two) times daily.    Dispense:  14 capsule    Refill:  0   ondansetron (ZOFRAN-ODT) 4 MG disintegrating tablet    Sig: Take 1 tablet (4 mg total) by mouth every 8 (eight) hours as needed for nausea or vomiting.    Dispense:  15 tablet    Refill:  0   You have had labs (blood tests and a urine culture) sent today. We will call you with any significant abnormalities or if there is need to begin or change treatment or pursue further follow up.  You may also review your test results online through [facility]. If you do not have a MyChart account, instructions to sign up should be on your discharge paperwork.

## 2022-09-09 NOTE — Progress Notes (Deleted)
    SUBJECTIVE:   CHIEF COMPLAINT / HPI:   Erin Coffey is a 77 y.o. female who presents to the [facility] clinic today to discuss the following concerns:   Urinary symptoms Onset: ***{CHL AMB INSTACARE DAY TO YEARS:21013018::"days"} Dysuria: {absent/present:22727::"absent"} Increased urinary frequency: {YES/NO:21197::"No "} Urinary urgency: {YES/NO:21197::"No "} Incontinence: {YES/NO:21197::"No "} Abdominal pain: {YES/NO:21197::"No "} Hematuria: {YES/NO:21197::"No "} Nausea/vomiting: {YES/NO:21197::"No "} Flank pain: {YES/NO:21197::"No "} Fever: {YES/NO:21197::"No "}   PERTINENT  PMH / PSH: ***  OBJECTIVE:   There were no vitals taken for this visit.   General: NAD, pleasant, able to participate in exam Cardiac: RRR, no murmurs. Respiratory: CTAB, normal effort, No wheezes, rales or rhonchi Abdomen: Bowel sounds present, nontender, nondistended, no hepatosplenomegaly. Extremities: no edema or cyanosis. Skin: warm and dry, no rashes noted Neuro: alert, no obvious focal deficits Psych: Normal affect and mood  ASSESSMENT/PLAN:   No problem-specific Assessment & Plan notes found for this encounter.     Sharion Settler, [place]

## 2022-09-09 NOTE — ED Provider Notes (Signed)
[facility]   [id number] 09/09/22 Arrival Time: 0806  ASSESSMENT & PLAN:  1. Acute pyelonephritis   2. Fever, unspecified fever cause    Meds ordered this encounter  Medications   acetaminophen (TYLENOL) tablet 650 mg   cefTRIAXone (ROCEPHIN) injection 1 g   cephALEXin (KEFLEX) 500 MG capsule    Sig: Take 1 capsule (500 mg total) by mouth 2 (two) times daily.    Dispense:  14 capsule    Refill:  0   ondansetron (ZOFRAN-ODT) 4 MG disintegrating tablet    Sig: Take 1 tablet (4 mg total) by mouth every 8 (eight) hours as needed for nausea or vomiting.    Dispense:  15 tablet    Refill:  0   Urine culture pending. Lab work pending. Will call with any significant abnormalities. Pt wishes to return home at this time.  Temp down upon discharge. In no distress. Benign abdomen. Tolerating PO intake. BP (!) 148/72 (BP Location: Right Arm)   Pulse (!) 104   Temp 99.6 F (37.6 C) (Oral)   Resp 18   SpO2 98%   Ensure hydration. OTC analgesics as needed.  Agrees to ED evaluation if any worsening.  Reviewed expectations re: course of current medical issues. Questions answered. Outlined signs and symptoms indicating need for more acute intervention. Patient verbalized understanding. After Visit Summary given.   SUBJECTIVE: History from: patient.  Erin Coffey is a 77 y.o. female who reports subj fevers, urinary frequency and dysuria, bilateral LBP; gradual onset over the past 1-2 weeks. Denies specific abd pain. Past 2 days has noted decreased urination despite drinking plenty of fluids. Denies hematuria. Also mentions dry cough; x 2 days.  No LMP recorded. Patient has had a hysterectomy.  Past Medical History:  Diagnosis Date   Allergy    Anemia, iron deficiency    ANXIETY, SITUATIONAL 04/06/2009   Qualifier: Diagnosis of  By: [name]  MD, Stephanie     Arthritis    Back pain, thoracic 12/20/2012   Carpal tunnel syndrome 12/07/2014   Cholelithiasis 06/25/2012    Chronic kidney disease    DM type 2 goal A1C below 7.5 06/05/2008   Qualifier: Diagnosis of  By: [name]  MD, Colletta [place]     Family history of breast cancer 03/05/2022   GASTROESOPHAGEAL REFLUX, NO ESOPHAGITIS 10/08/2006   Qualifier: Diagnosis of  By: Griffin Dakin, unspecified 10/21/2007   Qualifier: Diagnosis of  By: [name] MD, [name]     History of DVT (deep vein thrombosis) 05/05/2013   on Eliquis   HYPERLIPIDEMIA 08/08/2008   Qualifier: Diagnosis of  By: [name]  MD, Stephanie     HYPERTENSION, BENIGN ESSENTIAL 11/27/2006   Qualifier: Diagnosis of  ByHoy Morn MD, [name]     Mild coronary artery disease 10/2019   Coronary CT Angiogram: Coronary calcium score 46.Normal coronary origin.  Left dominance with tortuous coronary arteries.  Nondominant RCA - prox < 25% followed by ectasia (dilated to 5.5 mm) mixed plaque in distal vessel.  LM- normal . LAD: minimal mixed plaque in ostial & proximal (<25%) with brief IM bridging in mLAD. Mild mid-distal (25-49%), RI - prox <25%, Large LCx- minimal (<25%) mid-dista   Mild coronary artery disease    Personal history of colonic polyps 10/24/2009   Qualifier: Diagnosis of  By: [name] MD, Sandy Salaam    Pica 08/08/2009   Qualifier: Diagnosis of  By: [name], [name]     Pituitary adenoma [facility])  Polymyalgia rheumatica ([place]) 10/02/2009   Qualifier: Diagnosis of  By: [name] MD, Apolonio Schneiders     Pulmonary embolism [facility]) - On long Term anticoagulation (Z79.01) 04/28/2011   Converted from warfarin to Eliquis; on long-term anticoagulation.   Thyroid disease    Trigger ring finger of left hand 11/02/2014   Past Surgical History:  Procedure Laterality Date   ABDOMINAL HYSTERECTOMY     BREAST LUMPECTOMY WITH RADIOACTIVE SEED LOCALIZATION Right 03/25/2022   Procedure: RIGHT BREAST SEED LUMPECTOMY;  Surgeon: Erroll Luna, MD;  Location: [place];  Service: General;  Laterality: Right;   CARDIAC CATHETERIZATION  04/29/11   (Dr. Lia Foyer -  [place]): LM - normal. LAD with 1 major Diag - normal, bifurcating Ramus - normal, Large (6-7 mm) Dominant LCx with 1 OM, 2 LPL branches & LPDA - non significant disease; small non-dominant RCA.     CARDIAC EVENT MONITOR  09/2019   Mostly sinus rhythm, average rate 60 bpm.  Range 45 to 190 bpm.  No critical events indicating any arrhythmias or pauses.  For patient responses noted PVCs.  Less than 1% PVCs noted.    CARPAL TUNNEL RELEASE Right 1996   COLONOSCOPY     KNEE ARTHROSCOPY  Bilateral   TRANSTHORACIC ECHOCARDIOGRAM  09/2019   Normal EF - 60 to 65%.  Mild LVH.  GR 1 DD.  Normal RV.  Trivial MR.  Normal IVC.  Normal PA pressures.   TUBAL LIGATION     UPPER GASTROINTESTINAL ENDOSCOPY     OBJECTIVE:  Vitals:   09/09/22 0840  BP: (!) 148/72  Pulse: (!) 104  Resp: 18  Temp: (!) 102.8 F (39.3 C)  TempSrc: Oral  SpO2: 98%    General appearance: alert; no distress Oropharynx: moist Lungs: clear to auscultation bilaterally; unlabored Heart: regular Abdomen: obese; soft; non-distended; no significant abdominal tenderness; bowel sounds present; no masses or organomegaly; no guarding or rebound tenderness Back: no CVA tenderness Extremities: no edema; symmetrical with no gross deformities Skin: warm; dry Neurologic: normal gait Psychological: alert and cooperative; normal mood and affect  Labs: Results for orders placed or performed during the hospital encounter of 09/09/22  POC Influenza A & B Ag (Urgent Care)  Result Value Ref Range   INFLUENZA A ANTIGEN, POC NEGATIVE NEGATIVE   INFLUENZA B ANTIGEN, POC NEGATIVE NEGATIVE  POC Urinalysis dipstick  Result Value Ref Range   Glucose, UA NEGATIVE NEGATIVE mg/dL   Bilirubin Urine NEGATIVE NEGATIVE   Ketones, ur NEGATIVE NEGATIVE mg/dL   Specific Gravity, Urine 1.020 1.005 - 1.030   Hgb urine dipstick TRACE (A) NEGATIVE   pH 6.0 5.0 - 8.0   Protein, ur >=300 (A) NEGATIVE mg/dL   Urobilinogen, UA 0.2 0.0 - 1.0 mg/dL    Nitrite NEGATIVE NEGATIVE   Leukocytes,Ua SMALL (A) NEGATIVE  POC CBG monitoring  Result Value Ref Range   Glucose-Capillary 115 (H) 70 - 99 mg/dL   Labs Reviewed  POCT URINALYSIS DIPSTICK, ED / UC - Abnormal; Notable for the following components:      Result Value   Hgb urine dipstick TRACE (*)    Protein, ur >=300 (*)    Leukocytes,Ua SMALL (*)    All other components within normal limits  CBG MONITORING, ED - Abnormal; Notable for the following components:   Glucose-Capillary 115 (*)    All other components within normal limits  URINE CULTURE  CBC WITH DIFFERENTIAL/PLATELET  COMPREHENSIVE METABOLIC PANEL  LIPASE, BLOOD  POC INFLUENZA A AND B  ANTIGEN (URGENT CARE ONLY)    Allergies  Allergen Reactions   Lisinopril Cough                                               Past Medical History:  Diagnosis Date   Allergy    Anemia, iron deficiency    ANXIETY, SITUATIONAL 04/06/2009   Qualifier: Diagnosis of  By: [name]  MD, Stephanie     Arthritis    Back pain, thoracic 12/20/2012   Carpal tunnel syndrome 12/07/2014   Cholelithiasis 06/25/2012   Chronic kidney disease    DM type 2 goal A1C below 7.5 06/05/2008   Qualifier: Diagnosis of  By: [name]  MD, Colletta [place]     Family history of breast cancer 03/05/2022   GASTROESOPHAGEAL REFLUX, NO ESOPHAGITIS 10/08/2006   Qualifier: Diagnosis of  By: Griffin Dakin, unspecified 10/21/2007   Qualifier: Diagnosis of  By: [name] MD, [name]     History of DVT (deep vein thrombosis) 05/05/2013   on Eliquis   HYPERLIPIDEMIA 08/08/2008   Qualifier: Diagnosis of  By: [name]  MD, Stephanie     HYPERTENSION, BENIGN ESSENTIAL 11/27/2006   Qualifier: Diagnosis of  ByHoy Morn MD, [name]     Mild coronary artery disease 10/2019   Coronary CT Angiogram: Coronary calcium score 46.Normal coronary origin.  Left dominance with tortuous coronary arteries.  Nondominant RCA - prox < 25% followed by ectasia (dilated to 5.5 mm) mixed plaque in distal  vessel.  LM- normal . LAD: minimal mixed plaque in ostial & proximal (<25%) with brief IM bridging in mLAD. Mild mid-distal (25-49%), RI - prox <25%, Large LCx- minimal (<25%) mid-dista   Mild coronary artery disease    Personal history of colonic polyps 10/24/2009   Qualifier: Diagnosis of  By: [name] MD, Sandy Salaam    Pica 08/08/2009   Qualifier: Diagnosis of  By: [name], [name]     Pituitary adenoma [facility])    Polymyalgia rheumatica ([place]) 10/02/2009   Qualifier: Diagnosis of  By: [name] MD, Apolonio Schneiders     Pulmonary embolism [facility]) - On long Term anticoagulation (Z79.01) 04/28/2011   Converted from warfarin to Eliquis; on long-term anticoagulation.   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   Patient has 3 children. Two daughters live locally, one son in [place].    Patient is very active in her church and volunteer work to stay busy.    Patient enjoys cooking, spending time with family, and crossword puzzles.       Social Determinants of Radio broadcast assistant  Strain: Low Risk  (03/05/2022)   Overall Financial Resource Strain (CARDIA)    Difficulty of Paying Living Expenses: Not very hard  Food Insecurity: No Food Insecurity (03/05/2022)   Hunger Vital Sign    Worried About Running Out of Food in the Last Year: Never true    [place] in the Last Year: Never true  Transportation Needs: No Transportation Needs (03/05/2022)   PRAPARE - Hydrologist (Medical): No    Lack of Transportation (Non-Medical): No  Physical Activity: Sufficiently Active (03/03/2019)   Exercise Vital Sign    Days of Exercise per Week: 3 days    Minutes of Exercise per Session: 60 min  Stress: No Stress Concern Present (03/05/2022)   [place]    Feeling of Stress : Not at all  Social Connections: Moderately Integrated (01/05/2020)   Social Connection and Isolation Panel [NHANES]    Frequency of Communication with Friends and Family: More than three times a week    Frequency of Social Gatherings with Friends and Family: More than three times a week    Attends Religious Services: More than 4 times per year    Active Member of [employer] or Organizations: Yes    Attends Archivist Meetings: More than 4 times per year    Marital Status: Widowed  Intimate Partner Violence: Not At Risk (03/03/2019)   Humiliation, Afraid, Rape, and Kick questionnaire    Fear of Current or Ex-Partner: No    Emotionally Abused: No    Physically Abused: No    Sexually Abused: No   Family History  Problem Relation Age of Onset   Diabetes Mother    Heart disease Mother    Hypertension Mother    Heart disease Father    Hypertension Father    Diabetes Sister    Heart disease Sister    Leukemia Sister 43   Cirrhosis Sister        alcohol   Kidney disease Sister    Diabetes Sister    Hypertension Brother    Diabetes Brother    Breast cancer Cousin        dx 46s; maternal female cousin   Colon cancer Neg Hx    Pancreatic cancer Neg Hx    Esophageal cancer Neg Hx       Vanessa Kick, MD 09/09/22 1203

## 2022-09-09 NOTE — ED Notes (Signed)
Patient attempted to obtain a urine specimen, but was unable to void enough to collect.

## 2022-09-09 NOTE — ED Triage Notes (Addendum)
Patient c/o fever, urinary frequency, bilateral low back pain, and dysuria x 2 weeks. Patient added that she has had a cough x 2 days.  Patient states she last had Tylenol yesterday.

## 2022-09-09 NOTE — ED Notes (Signed)
Patient attempted to obtain a urine specimen, but was unsuccessful.

## 2022-09-10 LAB — URINE CULTURE: Culture: 60000 — AB

## 2022-09-12 ENCOUNTER — Telehealth: Payer: Self-pay

## 2022-09-12 NOTE — Telephone Encounter (Signed)
Patient calls nurse line regarding URI symptoms. Patient was also seen in the UC on 09/09/22 and was diagnosed with a UTI.   She currently reports productive cough (states that sometimes phlegm is green and other times it is clear), runny nose, and body aches.   She is asking what OTC medication would be safe for her to take given that she is on anastrozole.   Please advise.   Talbot Grumbling, RN

## 2022-09-15 ENCOUNTER — Telehealth: Payer: Self-pay

## 2022-09-15 ENCOUNTER — Encounter: Payer: Self-pay | Admitting: Adult Health

## 2022-09-15 ENCOUNTER — Ambulatory Visit (INDEPENDENT_AMBULATORY_CARE_PROVIDER_SITE_OTHER): Payer: Medicare Other | Admitting: Podiatry

## 2022-09-15 ENCOUNTER — Other Ambulatory Visit: Payer: Self-pay

## 2022-09-15 ENCOUNTER — Inpatient Hospital Stay: Payer: Medicare Other | Attending: Hematology and Oncology | Admitting: Adult Health

## 2022-09-15 VITALS — BP 147/65 | HR 74 | Temp 97.4°F | Resp 18 | Ht 68.0 in | Wt 285.9 lb

## 2022-09-15 DIAGNOSIS — Z8249 Family history of ischemic heart disease and other diseases of the circulatory system: Secondary | ICD-10-CM | POA: Insufficient documentation

## 2022-09-15 DIAGNOSIS — E785 Hyperlipidemia, unspecified: Secondary | ICD-10-CM | POA: Insufficient documentation

## 2022-09-15 DIAGNOSIS — N183 Chronic kidney disease, stage 3 unspecified: Secondary | ICD-10-CM | POA: Diagnosis not present

## 2022-09-15 DIAGNOSIS — I2510 Atherosclerotic heart disease of native coronary artery without angina pectoris: Secondary | ICD-10-CM | POA: Diagnosis not present

## 2022-09-15 DIAGNOSIS — Z86711 Personal history of pulmonary embolism: Secondary | ICD-10-CM | POA: Insufficient documentation

## 2022-09-15 DIAGNOSIS — Z833 Family history of diabetes mellitus: Secondary | ICD-10-CM | POA: Diagnosis not present

## 2022-09-15 DIAGNOSIS — E1122 Type 2 diabetes mellitus with diabetic chronic kidney disease: Secondary | ICD-10-CM | POA: Diagnosis not present

## 2022-09-15 DIAGNOSIS — Z86718 Personal history of other venous thrombosis and embolism: Secondary | ICD-10-CM | POA: Diagnosis not present

## 2022-09-15 DIAGNOSIS — R232 Flushing: Secondary | ICD-10-CM | POA: Diagnosis not present

## 2022-09-15 DIAGNOSIS — Z8379 Family history of other diseases of the digestive system: Secondary | ICD-10-CM | POA: Insufficient documentation

## 2022-09-15 DIAGNOSIS — Z8719 Personal history of other diseases of the digestive system: Secondary | ICD-10-CM | POA: Insufficient documentation

## 2022-09-15 DIAGNOSIS — Z888 Allergy status to other drugs, medicaments and biological substances status: Secondary | ICD-10-CM | POA: Insufficient documentation

## 2022-09-15 DIAGNOSIS — Z803 Family history of malignant neoplasm of breast: Secondary | ICD-10-CM | POA: Diagnosis not present

## 2022-09-15 DIAGNOSIS — Z91199 Patient's noncompliance with other medical treatment and regimen due to unspecified reason: Secondary | ICD-10-CM

## 2022-09-15 DIAGNOSIS — D0511 Intraductal carcinoma in situ of right breast: Secondary | ICD-10-CM | POA: Diagnosis not present

## 2022-09-15 DIAGNOSIS — Z79811 Long term (current) use of aromatase inhibitors: Secondary | ICD-10-CM | POA: Diagnosis not present

## 2022-09-15 DIAGNOSIS — Z87440 Personal history of urinary (tract) infections: Secondary | ICD-10-CM | POA: Diagnosis not present

## 2022-09-15 DIAGNOSIS — I129 Hypertensive chronic kidney disease with stage 1 through stage 4 chronic kidney disease, or unspecified chronic kidney disease: Secondary | ICD-10-CM | POA: Insufficient documentation

## 2022-09-15 DIAGNOSIS — Z90710 Acquired absence of both cervix and uterus: Secondary | ICD-10-CM | POA: Insufficient documentation

## 2022-09-15 DIAGNOSIS — Z86018 Personal history of other benign neoplasm: Secondary | ICD-10-CM | POA: Insufficient documentation

## 2022-09-15 DIAGNOSIS — Z79899 Other long term (current) drug therapy: Secondary | ICD-10-CM | POA: Diagnosis not present

## 2022-09-15 DIAGNOSIS — E2839 Other primary ovarian failure: Secondary | ICD-10-CM

## 2022-09-15 DIAGNOSIS — Z7901 Long term (current) use of anticoagulants: Secondary | ICD-10-CM | POA: Insufficient documentation

## 2022-09-15 NOTE — Progress Notes (Signed)
1. No-show for appointment

## 2022-09-15 NOTE — Progress Notes (Signed)
SURVIVORSHIP VISIT:    BRIEF ONCOLOGIC HISTORY:  Oncology History  Ductal carcinoma in situ (DCIS) of right breast  01/20/2022 Mammogram   Digital screening mammogram with incomplete evaluation.  Right breast focal asymmetry indeterminate. Diagnostic mammogram and ultrasound showed no correlate.  Stereotactic biopsy was recommended   02/14/2022 Pathology Results   Pathology from the right breast needle core biopsy showed DCIS, low to intermediate nuclear grade, cribriform and micropapillary types without necrosis and foci suspicious for microinvasion.  Negative for microcalcifications.  DCIS measured 2.2 mm in greatest extent   03/03/2022 Initial Diagnosis   Ductal carcinoma in situ (DCIS) of right breast   03/25/2022 Surgery   She had a right breast lumpectomy which showed DCIS abutting the posterior margin of resection, additional breast tissue from posterior margin negative for DCIS.  No invasive carcinoma.  Prognostics from prior biopsy showed 100% ER positive and 100% PR positive.   04/30/2022 - 05/27/2022 Radiation Therapy   Site Technique Total Dose (Gy) Dose per Fx (Gy) Completed Fx Beam Energies  Breast, Right: Breast_R 3D 42.56/42.56 2.66 16/16 15X, 10XFFF  Breast, Right: Breast_R_Bst 3D 8/8 2 4/4 10X     06/2022 -  Anti-estrogen oral therapy   Anastrozole     INTERVAL HISTORY:  Erin Coffey to review her survivorship care plan detailing her treatment course for breast cancer, as well as monitoring long-term side effects of that treatment, education regarding health maintenance, screening, and overall wellness and health promotion.     Overall, Erin Coffey reports feeling quite well.  She is taking Anastrozole daily.  She had a UTI last week that was treated with antibiotics and notes her creatinine has increased recently.  She is wondering if this is caused by anastrozole.  She also has some hot flashes but these are manageable for her.  REVIEW OF SYSTEMS:  Review of Systems   Constitutional:  Negative for appetite change, chills, fatigue, fever and unexpected weight change.  HENT:   Negative for hearing loss, lump/mass and trouble swallowing.   Eyes:  Negative for eye problems and icterus.  Respiratory:  Negative for chest tightness, cough and shortness of breath.   Cardiovascular:  Negative for chest pain, leg swelling and palpitations.  Gastrointestinal:  Negative for abdominal distention, abdominal pain, constipation, diarrhea, nausea and vomiting.  Endocrine: Positive for hot flashes.  Genitourinary:  Negative for difficulty urinating.   Musculoskeletal:  Negative for arthralgias.  Skin:  Negative for itching and rash.  Neurological:  Negative for dizziness, extremity weakness, headaches and numbness.  Hematological:  Negative for adenopathy. Does not bruise/bleed easily.  Psychiatric/Behavioral:  Negative for depression. The patient is not nervous/anxious.   Breast: Denies any new nodularity, masses, tenderness, nipple changes, or nipple discharge.      PAST MEDICAL/SURGICAL HISTORY:  Past Medical History:  Diagnosis Date   Allergy    Anemia, iron deficiency    ANXIETY, SITUATIONAL 04/06/2009   Qualifier: Diagnosis of  By: [name]  MD, Stephanie     Arthritis    Back pain, thoracic 12/20/2012   Carpal tunnel syndrome 12/07/2014   Cholelithiasis 06/25/2012   Chronic kidney disease    DM type 2 goal A1C below 7.5 06/05/2008   Qualifier: Diagnosis of  By: [name]  MD, Colletta [place]     Family history of breast cancer 03/05/2022   GASTROESOPHAGEAL REFLUX, NO ESOPHAGITIS 10/08/2006   Qualifier: Diagnosis of  By: Griffin Dakin, unspecified 10/21/2007   Qualifier: Diagnosis of  By: [name]  MD, [name]     History of DVT (deep vein thrombosis) 05/05/2013   on Eliquis   HYPERLIPIDEMIA 08/08/2008   Qualifier: Diagnosis of  By: [name]  MD, Stephanie     HYPERTENSION, BENIGN ESSENTIAL 11/27/2006   Qualifier: Diagnosis of  By: [name] MD, [name]     Mild  coronary artery disease 10/2019   Coronary CT Angiogram: Coronary calcium score 46.Normal coronary origin.  Left dominance with tortuous coronary arteries.  Nondominant RCA - prox < 25% followed by ectasia (dilated to 5.5 mm) mixed plaque in distal vessel.  LM- normal . LAD: minimal mixed plaque in ostial & proximal (<25%) with brief IM bridging in mLAD. Mild mid-distal (25-49%), RI - prox <25%, Large LCx- minimal (<25%) mid-dista   Mild coronary artery disease    Personal history of colonic polyps 10/24/2009   Qualifier: Diagnosis of  By: [name] MD, Sandy Salaam    [place] 08/08/2009   Qualifier: Diagnosis of  By: [name], [name]     Pituitary adenoma [facility])    Polymyalgia rheumatica ([place]) 10/02/2009   Qualifier: Diagnosis of  By: [name] MD, Apolonio Schneiders     Pulmonary embolism [facility]) - On long Term anticoagulation (Z79.01) 04/28/2011   Converted from warfarin to Eliquis; on long-term anticoagulation.   Thyroid disease    Trigger ring finger of left hand 11/02/2014   Past Surgical History:  Procedure Laterality Date   ABDOMINAL HYSTERECTOMY     BREAST LUMPECTOMY WITH RADIOACTIVE SEED LOCALIZATION Right 03/25/2022   Procedure: RIGHT BREAST SEED LUMPECTOMY;  Surgeon: Erroll Luna, MD;  Location: [facility];  Service: General;  Laterality: Right;   CARDIAC CATHETERIZATION  04/29/11   (Dr. Lia Foyer - [place]): LM - normal. LAD with 1 major Diag - normal, bifurcating Ramus - normal, Large (6-7 mm) Dominant LCx with 1 OM, 2 LPL branches & LPDA - non significant disease; small non-dominant RCA.     CARDIAC EVENT MONITOR  09/2019   Mostly sinus rhythm, average rate 60 bpm.  Range 45 to 190 bpm.  No critical events indicating any arrhythmias or pauses.  For patient responses noted PVCs.  Less than 1% PVCs noted.    CARPAL TUNNEL RELEASE Right 1996   COLONOSCOPY     KNEE ARTHROSCOPY  Bilateral   TRANSTHORACIC ECHOCARDIOGRAM  09/2019   Normal EF - 60 to 65%.  Mild LVH.  GR 1 DD.  Normal RV.   Trivial MR.  Normal IVC.  Normal PA pressures.   TUBAL LIGATION     UPPER GASTROINTESTINAL ENDOSCOPY       ALLERGIES:  Allergies  Allergen Reactions   Lisinopril Cough     CURRENT MEDICATIONS:  Outpatient Encounter Medications as of 09/15/2022  Medication Sig   acetaminophen (TYLENOL) 650 MG CR tablet Take 1,300 mg by mouth every 8 (eight) hours as needed for pain.   amLODipine (NORVASC) 5 MG tablet TAKE 1 TABLET BY MOUTH AT  BEDTIME   anastrozole (ARIMIDEX) 1 MG tablet Take 1 tablet (1 mg total) by mouth daily.   apixaban (ELIQUIS) 5 MG TABS tablet Take 1 tablet (5 mg total) by mouth 2 (two) times daily.   atorvastatin (LIPITOR) 40 MG tablet TAKE 1 TABLET BY MOUTH  DAILY   baclofen (LIORESAL) 10 MG tablet Take 1 tablet (10 mg total) by mouth daily as needed for muscle spasms.   Blood Glucose Monitoring Suppl (ONETOUCH VERIO) w/Device KIT Use as instructed to test sugars once daily.  Dx Code: E11.9   cephALEXin (  KEFLEX) 500 MG capsule Take 1 capsule (500 mg total) by mouth 2 (two) times daily.   CHOLECALCIFEROL PO Take 2,200 Units by mouth daily.   dapagliflozin propanediol (FARXIGA) 10 MG TABS tablet Take 1 tablet (10 mg total) by mouth daily.   fish oil-omega-3 fatty acids 1000 MG capsule Take 1 g by mouth daily.   gabapentin (NEURONTIN) 100 MG capsule TAKE 1 CAPSULE BY MOUTH TWICE  DAILY   glucose blood (ONETOUCH VERIO) test strip Use as instructed to test sugars once daily.  Dx Code: E11.9   iron polysaccharides (NIFEREX) 150 MG capsule    ketoconazole (NIZORAL) 2 % cream Apply to both feet once daily for 4 weeks.   Lancet Devices (ONE TOUCH DELICA LANCING DEV) MISC Use as instructed to test sugars once daily.  Dx Code: E11.9   losartan (COZAAR) 100 MG tablet Take 1 tablet (100 mg total) by mouth daily.   metoprolol tartrate (LOPRESSOR) 25 MG tablet TAKE 1 TABLET BY MOUTH  TWICE DAILY   omeprazole (PRILOSEC) 20 MG capsule Take 1 capsule (20 mg total) by mouth daily.   ondansetron  (ZOFRAN-ODT) 4 MG disintegrating tablet Take 1 tablet (4 mg total) by mouth every 8 (eight) hours as needed for nausea or vomiting.   OneTouch Delica Lancets 35K MISC Use as instructed to test sugars once daily.  Dx Code: E11.9   polyethylene glycol powder (GLYCOLAX/MIRALAX) powder Take 17 g by mouth 2 (two) times daily as needed. (Patient taking differently: Take 17 g by mouth daily as needed (constipation).)   traMADol (ULTRAM) 50 MG tablet TAKE 1 TABLET BY MOUTH EVERY 6 HOURS AS NEEDED   vitamin C (ASCORBIC ACID) 500 MG tablet Take 500 mg by mouth daily.   No facility-administered encounter medications on file as of 09/15/2022.     ONCOLOGIC FAMILY HISTORY:  Family History  Problem Relation Age of Onset   Diabetes Mother    Heart disease Mother    Hypertension Mother    Heart disease Father    Hypertension Father    Diabetes Sister    Heart disease Sister    Leukemia Sister 71   Cirrhosis Sister        alcohol   Kidney disease Sister    Diabetes Sister    Hypertension Brother    Diabetes Brother    Breast cancer Cousin        dx 62s; maternal female cousin   Colon cancer Neg Hx    Pancreatic cancer Neg Hx    Esophageal cancer Neg Hx     SOCIAL HISTORY:  Social History   Socioeconomic History   Marital status: Widowed    Spouse name: Not on file   Number of children: 3   Years of education: 10   Highest education level: 10th grade  Occupational History   Occupation: Retired- Airline pilot: [employer]  Tobacco Use   Smoking status: Never   Smokeless tobacco: Never  Scientific laboratory technician Use: Never used  Substance and Sexual Activity   Alcohol use: No    Alcohol/week: 0.0 standard drinks of alcohol   Drug use: No   Sexual activity: Not Currently  Other Topics Concern   Not on file  Social History Narrative   Patient lives alone in [place], she is a widow.   Patient has 3 children. Two daughters live locally, one son in [place].    Patient is very active  in her church and volunteer work  to stay busy.    Patient enjoys cooking, spending time with family, and crossword puzzles.       Social Determinants of Health   Financial Resource Strain: Low Risk  (03/05/2022)   Overall Financial Resource Strain (CARDIA)    Difficulty of Paying Living Expenses: Not very hard  Food Insecurity: No Food Insecurity (03/05/2022)   Hunger Vital Sign    Worried About Running Out of Food in the Last Year: Never true    Ran Out of Food in the Last Year: Never true  Transportation Needs: No Transportation Needs (03/05/2022)   PRAPARE - Hydrologist (Medical): No    Lack of Transportation (Non-Medical): No  Physical Activity: Sufficiently Active (03/03/2019)   Exercise Vital Sign    Days of Exercise per Week: 3 days    Minutes of Exercise per Session: 60 min  Stress: No Stress Concern Present (03/05/2022)   [place]    Feeling of Stress : Not at all  Social Connections: Moderately Integrated (01/05/2020)   Social Connection and Isolation Panel [NHANES]    Frequency of Communication with Friends and Family: More than three times a week    Frequency of Social Gatherings with Friends and Family: More than three times a week    Attends Religious Services: More than 4 times per year    Active Member of [employer] or Organizations: Yes    Attends Archivist Meetings: More than 4 times per year    Marital Status: Widowed  Intimate Partner Violence: Not At Risk (03/03/2019)   Humiliation, Afraid, Rape, and Kick questionnaire    Fear of Current or Ex-Partner: No    Emotionally Abused: No    Physically Abused: No    Sexually Abused: No     OBSERVATIONS/OBJECTIVE:  BP (!) 147/65 (BP Location: Left Arm, Patient Position: Sitting)   Pulse 74   Temp (!) 97.4 F (36.3 C) (Tympanic)   Resp 18   Ht '5\' 8"'$  (1.727 m)   Wt 285 lb 14.4 oz (129.7 kg)   SpO2 100%   BMI  43.47 kg/m  GENERAL: Patient is a well appearing female in no acute distress HEENT:  Sclerae anicteric.  Oropharynx clear and moist. No ulcerations or evidence of oropharyngeal candidiasis. Neck is supple.  NODES:  No cervical, supraclavicular, or axillary lymphadenopathy palpated.  BREAST EXAM: Right breast status postlumpectomy and radiation no sign of local recurrence left breast is benign. LUNGS:  Clear to auscultation bilaterally.  No wheezes or rhonchi. HEART:  Regular rate and rhythm. No murmur appreciated. ABDOMEN:  Soft, nontender.  Positive, normoactive bowel sounds. No organomegaly palpated. MSK:  No focal spinal tenderness to palpation. Full range of motion bilaterally in the upper extremities. EXTREMITIES:  No peripheral edema.   SKIN:  Clear with no obvious rashes or skin changes. No nail dyscrasia. NEURO:  Nonfocal. Well oriented.  Appropriate affect.  LABORATORY DATA:  None for this visit.  DIAGNOSTIC IMAGING:  None for this visit.      ASSESSMENT AND PLAN:  Ms.. Coffey is a pleasant 77 y.o. female with Stage 0 right breast DCIS, ER+/PR+, diagnosed in June 2023, treated with lumpectomy, adjuvant radiation therapy, and anti-estrogen therapy with anastrozole beginning in 06/2022.  She presents to the Survivorship Clinic for our initial meeting and routine follow-up post-completion of treatment for breast cancer.    1. Stage 0 right breast cancer:  Ms. Flamenco is continuing to  recover from definitive treatment for breast cancer. She will follow-up with her medical oncologist, Dr. Gala Romney who in May 2024 with history and physical exam per surveillance protocol.  She will continue her anti-estrogen therapy with anastrozole. Thus far, she is tolerating the anastrozole well, with minimal side effects (see #2). Her mammogram is due June 2024; orders placed today. Today, a comprehensive survivorship care plan and treatment summary was reviewed with the patient today detailing her breast  cancer diagnosis, treatment course, potential late/long-term effects of treatment, appropriate follow-up care with recommendations for the future, and patient education resources.  A copy of this summary, along with a letter will be sent to the patient's primary care provider via mail/fax/In Basket message after today's visit.    2. Recent UTI and ? Creatinine elevation: Orbie Hurst will follow-up with her nephrologist later this week about potentially nephrotoxic medication that she may be on.  I reviewed with Orbie Hurst that the anastrozole that she was prescribed was given so based on her risk and benefit discussion.  Therefore sometimes the risks of taking the anastrozole outweigh the benefits and so we would await the nephrologist recommendation on whether anastrozole could be the cause of her creatinine elevation.  I let her know that we do not see this often but will defer to their expertise and opinion and discuss further.  We also discussed that vaginal dryness from the anastrozole can increase the risk of urinary tract infection.  I recommended that she use a very small amount of coconut oil as a moisturizer to her labia and also consider vitamin E suppositories as well.  Should she continue to experience urinary tract infections again we may have to discuss potentially discontinuing the anastrozole and exploring other options for antiestrogen therapy or foregoing it altogether.   3. Bone health:  Given Ms. Weingartner's age/history of breast cancer and her current treatment regimen including anti-estrogen therapy with anastrozole, she is at risk for bone demineralization.   Her most recent bone density testing occurred on January 17, 2021 and was normal.  Since she is taking anastrozole we recommend repeating bone density testing every 2 years.  I placed orders for this to occur in June 2024.  She was given education on specific activities to promote bone health.  4. Cancer screening:  Due to Ms. Campas's history  and her age, she should receive screening for skin cancers, colon cancer, and gynecologic cancers.  The information and recommendations are listed on the patient's comprehensive care plan/treatment summary and were reviewed in detail with the patient.    5. Health maintenance and wellness promotion: Ms. Casco was encouraged to consume 5-7 servings of fruits and vegetables per day.   She was also encouraged to engage in moderate to vigorous exercise for 30 minutes per day most days of the week. She was instructed to limit her alcohol consumption and continue to abstain from tobacco use.     6. Support services/counseling: It is not uncommon for this period of the patient's cancer care trajectory to be one of many emotions and stressors. She was given information regarding our available services and encouraged to contact me with any questions or for help enrolling in any of our support group/programs.    Follow up instructions:    -Return to cancer center in May 2024 for follow-up with Dr. Chryl Heck -Mammogram due in June 2024 -Bone density testing in June 2024 -She is welcome to return back to the Survivorship Clinic at any time; no additional follow-up  needed at this time.  -Consider referral back to survivorship as a long-term survivor for continued surveillance  The patient was provided an opportunity to ask questions and all were answered. The patient agreed with the plan and demonstrated an understanding of the instructions.   Total encounter time:45 minutes*in face-to-face visit time, chart review, lab review, care coordination, order entry, and documentation of the encounter time.    Wilber Bihari, NP 09/15/22 8:37 AM Medical Oncology and Hematology [facility] [place], [place] [address] Tel. [phone]    Fax. [phone]  *Total Encounter Time as defined by the Centers for Medicare and Medicaid Services includes, in addition to the face-to-face time of  a patient visit (documented in the note above) non-face-to-face time: obtaining and reviewing outside history, ordering and reviewing medications, tests or procedures, care coordination (communications with other health care professionals or caregivers) and documentation in the medical record.

## 2022-09-15 NOTE — Telephone Encounter (Signed)
Faxed Mammogram and Bone Density orders to [facility] at [phone]. Fax confirmation received.

## 2022-09-16 NOTE — Telephone Encounter (Signed)
Contacted the patient and informed her what OTC medications she can take. Patient understood and stated she will follow up if she feel like she needs to.

## 2022-09-18 DIAGNOSIS — N2581 Secondary hyperparathyroidism of renal origin: Secondary | ICD-10-CM | POA: Diagnosis not present

## 2022-09-18 DIAGNOSIS — N1832 Chronic kidney disease, stage 3b: Secondary | ICD-10-CM | POA: Diagnosis not present

## 2022-09-18 DIAGNOSIS — I129 Hypertensive chronic kidney disease with stage 1 through stage 4 chronic kidney disease, or unspecified chronic kidney disease: Secondary | ICD-10-CM | POA: Diagnosis not present

## 2022-09-18 DIAGNOSIS — R809 Proteinuria, unspecified: Secondary | ICD-10-CM | POA: Diagnosis not present

## 2022-09-18 DIAGNOSIS — D631 Anemia in chronic kidney disease: Secondary | ICD-10-CM | POA: Diagnosis not present

## 2022-10-16 NOTE — Progress Notes (Signed)
  SUBJECTIVE:   CHIEF COMPLAINT / HPI:   Abdominal Pain  Diarrhea: Patient presenting after visit to the ED for pyelonephritis, received Keflex for this She reports that she no longer is experiencing symptoms of pyelo (no urinary symptoms) but has significant abd pain x2 weeks Patient has been passing flatus She has been feeling constipated  Worried about her diverticulitis acting up, reports that her symptoms in the right lower quadrant of the abdomen are similar to those she is experienced for diverticulitis in the past She has lost seven pounds the beginning of February Had a bowel movement this AM, has had worsening pain since being that bowel movement.  Patient denies hematochezia or hematemesis, denies vomiting or nausea Not eating well, secondary to concerns about being in pain. Patient has her appendix and her gallbladder.  PERTINENT  PMH / PSH:  History of pulmonary emboli, morbid obesity, coronary artery disease, hypertension, hyperlipidemia, chronic kidney disease, type 2 diabetes, history of pituitary mass, osteoporosis, DCIS of the right breast, anemia, GERD, vertigo    Patient Care Team: Erskine Emery, MD as PCP - General (Family Medicine) Marica Otter, [place] (Optometry) Ashok Pall, MD as Consulting Physician (Neurosurgery) Rosita Fire, MD as Consulting Physician (Nephrology) Leonie Man, MD as Consulting Physician (Cardiology) Erroll Luna, MD as Consulting Physician (General Surgery) Benay Pike, MD as Consulting Physician (Hematology and Oncology) Kyung Rudd, MD as Consulting Physician (Radiation Oncology) OBJECTIVE:  Pulse (!) 59   Ht 5\' 8"  (1.727 m)   Wt 284 lb (128.8 kg)   SpO2 100%   BMI 43.18 kg/m BP 179/58 Physical Exam  General: Alert and oriented in mild distress  Heart: Regular rate and rhythm with no murmurs appreciated Lungs: CTA bilaterally, no wheezing Abdomen: Bowel sounds present, significant tenderness to palpation of  RLQ with guarding/no rebound TTP, nonperitoneal, no rashes, negative CVA tenderness Skin: Warm and dry   ASSESSMENT/PLAN:  Abdominal pain, unspecified abdominal location Assessment & Plan: Significant abdominal pain with broad differential: Possibly related to diverticulitis, appendiceal abnormality, less likely SBO given ability to pass flatus and bowel movement, does not appear related to ovarian etiology.  No urinary symptoms at this point, however possibly related to kidney stones.  Given the severity of her pain in office and her significant comorbidities, decided that we would be able to obtain abdominal imaging faster through the emergency room.  Contacted emergency room charge nurse for the day that the patient was coming to the ED.  The patient is agreeable to driving herself to the ED, hemodynamically stable in office.  Will update the inpatient FM TS team as well.  Patient verbalized understanding and left for the emergency room.      Erskine Emery, MD 10/17/2022, 11:32 AM PGY-2, [place]

## 2022-10-17 ENCOUNTER — Encounter: Payer: Self-pay | Admitting: Student

## 2022-10-17 ENCOUNTER — Emergency Department (HOSPITAL_COMMUNITY): Payer: Medicare Other

## 2022-10-17 ENCOUNTER — Ambulatory Visit (INDEPENDENT_AMBULATORY_CARE_PROVIDER_SITE_OTHER): Payer: Medicare Other | Admitting: Student

## 2022-10-17 ENCOUNTER — Other Ambulatory Visit: Payer: Self-pay

## 2022-10-17 ENCOUNTER — Emergency Department (HOSPITAL_COMMUNITY)
Admission: EM | Admit: 2022-10-17 | Discharge: 2022-10-17 | Disposition: A | Discharge status: Home / Self Care | Payer: Medicare Other | Attending: Emergency Medicine | Admitting: Emergency Medicine

## 2022-10-17 VITALS — HR 59 | Ht 68.0 in | Wt 284.0 lb

## 2022-10-17 DIAGNOSIS — R109 Unspecified abdominal pain: Secondary | ICD-10-CM

## 2022-10-17 DIAGNOSIS — N189 Chronic kidney disease, unspecified: Secondary | ICD-10-CM | POA: Diagnosis not present

## 2022-10-17 DIAGNOSIS — R1033 Periumbilical pain: Secondary | ICD-10-CM | POA: Diagnosis not present

## 2022-10-17 DIAGNOSIS — R1031 Right lower quadrant pain: Secondary | ICD-10-CM | POA: Diagnosis present

## 2022-10-17 DIAGNOSIS — Z7901 Long term (current) use of anticoagulants: Secondary | ICD-10-CM | POA: Diagnosis not present

## 2022-10-17 DIAGNOSIS — Z79899 Other long term (current) drug therapy: Secondary | ICD-10-CM | POA: Diagnosis not present

## 2022-10-17 DIAGNOSIS — R1030 Lower abdominal pain, unspecified: Secondary | ICD-10-CM

## 2022-10-17 DIAGNOSIS — E1122 Type 2 diabetes mellitus with diabetic chronic kidney disease: Secondary | ICD-10-CM | POA: Diagnosis not present

## 2022-10-17 DIAGNOSIS — I2510 Atherosclerotic heart disease of native coronary artery without angina pectoris: Secondary | ICD-10-CM | POA: Insufficient documentation

## 2022-10-17 DIAGNOSIS — R001 Bradycardia, unspecified: Secondary | ICD-10-CM | POA: Diagnosis not present

## 2022-10-17 DIAGNOSIS — I129 Hypertensive chronic kidney disease with stage 1 through stage 4 chronic kidney disease, or unspecified chronic kidney disease: Secondary | ICD-10-CM | POA: Insufficient documentation

## 2022-10-17 LAB — URINALYSIS, ROUTINE W REFLEX MICROSCOPIC
Bilirubin Urine: NEGATIVE
Glucose, UA: 150 mg/dL — AB
Hgb urine dipstick: NEGATIVE
Ketones, ur: NEGATIVE mg/dL
Leukocytes,Ua: NEGATIVE
Nitrite: NEGATIVE
Protein, ur: NEGATIVE mg/dL
Specific Gravity, Urine: 1.004 — ABNORMAL LOW (ref 1.005–1.030)
pH: 6 (ref 5.0–8.0)

## 2022-10-17 LAB — COMPREHENSIVE METABOLIC PANEL
ALT: 12 U/L (ref 0–44)
AST: 28 U/L (ref 15–41)
Albumin: 3.4 g/dL — ABNORMAL LOW (ref 3.5–5.0)
Alkaline Phosphatase: 51 U/L (ref 38–126)
Anion gap: 9 (ref 5–15)
BUN: 17 mg/dL (ref 8–23)
CO2: 23 mmol/L (ref 22–32)
Calcium: 9.4 mg/dL (ref 8.9–10.3)
Chloride: 103 mmol/L (ref 98–111)
Creatinine, Ser: 1.21 mg/dL — ABNORMAL HIGH (ref 0.44–1.00)
GFR, Estimated: 47 mL/min — ABNORMAL LOW (ref >60)
Glucose, Bld: 88 mg/dL (ref 70–99)
Potassium: 4.5 mmol/L (ref 3.5–5.1)
Sodium: 135 mmol/L (ref 135–145)
Total Bilirubin: 1.1 mg/dL (ref 0.3–1.2)
Total Protein: 7.9 g/dL (ref 6.5–8.1)

## 2022-10-17 LAB — CBC WITH DIFFERENTIAL/PLATELET
Abs Immature Granulocytes: 0.02 10*3/uL (ref 0.00–0.07)
Basophils Absolute: 0 10*3/uL (ref 0.0–0.1)
Basophils Relative: 0 %
Eosinophils Absolute: 0.2 10*3/uL (ref 0.0–0.5)
Eosinophils Relative: 4 %
HCT: 33.4 % — ABNORMAL LOW (ref 36.0–46.0)
Hemoglobin: 10.8 g/dL — ABNORMAL LOW (ref 12.0–15.0)
Immature Granulocytes: 1 %
Lymphocytes Relative: 38 %
Lymphs Abs: 1.6 10*3/uL (ref 0.7–4.0)
MCH: 29.8 pg (ref 26.0–34.0)
MCHC: 32.3 g/dL (ref 30.0–36.0)
MCV: 92 fL (ref 80.0–100.0)
Monocytes Absolute: 0.6 10*3/uL (ref 0.1–1.0)
Monocytes Relative: 14 %
Neutro Abs: 1.9 10*3/uL (ref 1.7–7.7)
Neutrophils Relative %: 43 %
Platelets: 195 10*3/uL (ref 150–400)
RBC: 3.63 MIL/uL — ABNORMAL LOW (ref 3.87–5.11)
RDW: 15.9 % — ABNORMAL HIGH (ref 11.5–15.5)
WBC: 4.2 10*3/uL (ref 4.0–10.5)
nRBC: 0 % (ref 0.0–0.2)

## 2022-10-17 LAB — LIPASE, BLOOD: Lipase: 33 U/L (ref 11–51)

## 2022-10-17 MED ORDER — IOHEXOL 350 MG/ML SOLN
75.0000 mL | Freq: Once | INTRAVENOUS | Status: AC | PRN
  Administered 2022-10-17: 75 mL via INTRAVENOUS

## 2022-10-17 NOTE — ED Provider Notes (Cosign Needed Addendum)
  Physical Exam  BP (!) 137/53   Pulse (!) 55   Temp 98.1 F (36.7 C)   Resp 15   Ht 5\' 8"  (1.727 m)   Wt 128.8 kg   SpO2 100%   BMI 43.18 kg/m   Physical Exam Vitals and nursing note reviewed.  Constitutional:      General: She is not in acute distress.    Appearance: She is well-developed.  HENT:     Head: Normocephalic and atraumatic.  Eyes:     Conjunctiva/sclera: Conjunctivae normal.  Cardiovascular:     Rate and Rhythm: Regular rhythm. Bradycardia present.     Heart sounds: No murmur heard. Pulmonary:     Effort: Pulmonary effort is normal. No respiratory distress.     Breath sounds: Normal breath sounds.  Abdominal:     Palpations: Abdomen is soft.     Tenderness: There is abdominal tenderness in the right lower quadrant, periumbilical area and suprapubic area.  Musculoskeletal:        General: No swelling.     Cervical back: Neck supple.  Skin:    General: Skin is warm and dry.     Capillary Refill: Capillary refill takes less than 2 seconds.  Neurological:     Mental Status: She is alert.  Psychiatric:        Mood and Affect: Mood normal.     Procedures  Procedures  ED Course / MDM   Clinical Course as of 10/17/22 1556  Fri Oct 17, 2022  1524 3 weeks of abdominal pain. RLQ, right groin. No A/A. Cramping sensation. Hx diverticulitis. Similar presentation in past. No F/N/V/D. No dysuria.  [CG]  1525 Dispo per imaging.  [CG]    Clinical Course User Index [CG] Erin Cecil, PA-C   Medical Decision Making Amount and/or Complexity of Data Reviewed Labs: ordered. Radiology: ordered.  Risk Prescription drug management.   Patient signed out to me pending CT imaging.  Please see previous provider note for further details.  In short this is a 77 year old female with a history of hyperlipidemia, type 2 diabetes, obesity, hypertension, CKD, PE, CAD, diverticulitis who presents to ED for evaluation of 3 weeks of abdominal pain that is worsened  today.  The patient was signed out to me pending CT imaging.  Per previous provider plan, if CT imaging negative patient to follow-up with PCP.  CT abdomen pelvis shows no evidence of appendicitis, diverticulitis, other intra-abdominal process.  Imaging results discussed with patient.  The patient will be advised to follow-up with her PCP.  The patient is amenable to this plan.  Patient was given opportunity to ask all questions and all of her questions were answered to her satisfaction.  The patient was provided return precautions and she voiced understanding.  The patient was discharged in stable condition.  Patient amenable to plan.           Erin Cecil, PA-C 10/17/22 1556    Blanchie Dessert, MD 10/18/22 1858

## 2022-10-17 NOTE — Patient Instructions (Signed)
It was great to see you today! Thank you for choosing [facility] for your primary care. Erin Coffey was seen for abdominal pain.  Today we addressed: [facility]  Address: [address] [place], [place], [place] [address] Phone: [phone]  Please be seen in the ED   If you haven't already, sign up for My Chart to have easy access to your labs results, and communication with your primary care physician.  I recommend that you always bring your medications to each appointment as this makes it easy to ensure you are on the correct medications and helps [name] not miss refills when you need them. Call the clinic at [phone] if your symptoms worsen or you have any concerns.  Please arrive 15 minutes before your appointment to ensure smooth check in process.  We appreciate your efforts in making this happen.  Thank you for allowing me to participate in your care, Erskine Emery, MD 10/17/2022, 11:16 AM PGY-2, [place]

## 2022-10-17 NOTE — ED Notes (Signed)
Patient transported to CT 

## 2022-10-17 NOTE — ED Triage Notes (Signed)
Pt to ED from via POV. Pt c/o pain in right low back that radiates to RLQ/right groin. Pt is worse with bowel movements and urination. Pt denies painful or burning urination. A/O x 4

## 2022-10-17 NOTE — ED Notes (Signed)
AVS reviewed with pt prior to discharge. Pt verbalizes understanding. Belongings with pt upon depart. Wheelchair taken to lobby. Pt ambulatory from lobby to POV by self

## 2022-10-17 NOTE — Discharge Instructions (Addendum)
Please follow-up with your PCP for further management Please return to the ED with any new or worsening signs or symptoms.  Please return to the ED if your symptoms change in severity, frequency or nature. Please read attached guide concerning abdominal pain

## 2022-10-17 NOTE — ED Provider Notes (Signed)
[facility] Provider Note   CSN: [id number] Arrival date & time: 10/17/22  1139     History  Chief Complaint  Patient presents with   Abdominal Pain    Erin Coffey is a 77 y.o. female.  With past medical history of hyperlipidemia, type 2 diabetes, obesity, hypertension, CKD, PE, CAD, who presents to the emergency department with abdominal pain.  Patient states that she has been having ongoing abdominal pain for about 3 weeks but worsened today.  She states that she has had suprapubic abdominal pain when urinating and right groin and right lower quadrant abdominal pain when having bowel movements. Nothing makes the symptoms better or worse. She denies having dysuria, only suprapubic pain with urination. She denies hematuria. Additionally the right sided abdominal pain occurs when she is going to have a bowel movement. She states this can be cramping or sharp abdominal pain. She has a history of diverticulitis and has flared in similar area. She denies having fevers, nausea, vomiting, diarrhea, hematochezia or melena.      Abdominal Pain      Home Medications Prior to Admission medications   Medication Sig Start Date End Date Taking? Authorizing Provider  acetaminophen (TYLENOL) 650 MG CR tablet Take 1,300 mg by mouth every 8 (eight) hours as needed for pain.    [provider]  amLODipine (NORVASC) 5 MG tablet TAKE 1 TABLET BY MOUTH AT  BEDTIME 12/30/21   Brimage, Vondra, DO  anastrozole (ARIMIDEX) 1 MG tablet Take 1 tablet (1 mg total) by mouth daily. 06/16/22   Benay Pike, MD  apixaban (ELIQUIS) 5 MG TABS tablet Take 1 tablet (5 mg total) by mouth 2 (two) times daily. 08/23/22   Erskine Emery, MD  atorvastatin (LIPITOR) 40 MG tablet TAKE 1 TABLET BY MOUTH  DAILY 02/24/22   Erskine Emery, MD  baclofen (LIORESAL) 10 MG tablet Take 1 tablet (10 mg total) by mouth daily as needed for muscle spasms. 05/12/22   Erskine Emery, MD  Blood Glucose Monitoring Suppl (ONETOUCH VERIO) w/Device KIT Use as instructed to test sugars once daily.  Dx Code: E11.9 02/10/19   Martyn Malay, MD  cephALEXin (KEFLEX) 500 MG capsule Take 1 capsule (500 mg total) by mouth 2 (two) times daily. 09/09/22   Vanessa Kick, MD  CHOLECALCIFEROL PO Take 2,200 Units by mouth daily.    [provider]  dapagliflozin propanediol (FARXIGA) 10 MG TABS tablet Take 1 tablet (10 mg total) by mouth daily. 09/01/22   Erskine Emery, MD  fish oil-omega-3 fatty acids 1000 MG capsule Take 1 g by mouth daily.    [provider]  gabapentin (NEURONTIN) 100 MG capsule TAKE 1 CAPSULE BY MOUTH TWICE  DAILY 07/28/22   Erskine Emery, MD  glucose blood (ONETOUCH VERIO) test strip Use as instructed to test sugars once daily.  Dx Code: E11.9 03/21/22   Erskine Emery, MD  iron polysaccharides (NIFEREX) 150 MG capsule     [provider]  ketoconazole (NIZORAL) 2 % cream Apply to both feet once daily for 4 weeks. 01/17/22   [name], DO  Lancet Devices (ONE TOUCH DELICA LANCING DEV) MISC Use as instructed to test sugars once daily.  Dx Code: E11.9 10/29/20   [name], DO  losartan (COZAAR) 100 MG tablet Take 1 tablet (100 mg total) by mouth daily. 03/11/21 03/18/22  [name], DO  metoprolol tartrate (LOPRESSOR) 25 MG tablet TAKE 1 TABLET BY MOUTH  TWICE DAILY 01/07/22   Lurline Del, DO  omeprazole (PRILOSEC) 20 MG capsule Take 1 capsule (20 mg total) by mouth daily. 05/03/21   Palumbo, April, MD  ondansetron (ZOFRAN-ODT) 4 MG disintegrating tablet Take 1 tablet (4 mg total) by mouth every 8 (eight) hours as needed for nausea or vomiting. 09/09/22   Vanessa Kick, MD  OneTouch Delica Lancets [id number] MISC Use as instructed to test sugars once daily.  Dx Code: E11.9 02/10/19   Martyn Malay, MD  polyethylene glycol powder (GLYCOLAX/MIRALAX) powder Take 17 g by mouth 2 (two) times daily as needed. Patient taking differently: Take 17 g  by mouth daily as needed (constipation). 08/17/18   Myles Gip, DO  traMADol (ULTRAM) 50 MG tablet TAKE 1 TABLET BY MOUTH EVERY 6 HOURS AS NEEDED 08/23/22   Erskine Emery, MD  vitamin C (ASCORBIC ACID) 500 MG tablet Take 500 mg by mouth daily.    [provider]      Allergies    Lisinopril    Review of Systems   Review of Systems  Gastrointestinal:  Positive for abdominal pain. Negative for blood in stool.  All other systems reviewed and are negative.   Physical Exam Updated Vital Signs BP (!) 137/53   Pulse (!) 55   Temp 98.1 F (36.7 C)   Resp 15   Ht '5\' 8"'$  (1.727 m)   Wt 128.8 kg   SpO2 100%   BMI 43.18 kg/m  Physical Exam Vitals and nursing note reviewed.  Constitutional:      General: She is not in acute distress.    Appearance: Normal appearance. She is well-developed. She is obese. She is not ill-appearing or toxic-appearing.  HENT:     Head: Normocephalic.  Eyes:     General: No scleral icterus. Cardiovascular:     Rate and Rhythm: Regular rhythm. Bradycardia present.     Heart sounds: No murmur heard. Pulmonary:     Effort: Pulmonary effort is normal.     Breath sounds: Normal breath sounds.  Abdominal:     General: Abdomen is protuberant. Bowel sounds are normal. There is no distension.     Palpations: Abdomen is soft.     Tenderness: There is abdominal tenderness in the right lower quadrant, periumbilical area and suprapubic area. There is no right CVA tenderness, guarding or rebound.  Skin:    General: Skin is warm and dry.     Capillary Refill: Capillary refill takes less than 2 seconds.  Neurological:     General: No focal deficit present.     Mental Status: She is alert and oriented to person, place, and time.  Psychiatric:        Mood and Affect: Mood normal.        Behavior: Behavior normal.     ED Results / Procedures / Treatments   Labs (all labs ordered are listed, but only abnormal results are displayed) Labs Reviewed   COMPREHENSIVE METABOLIC PANEL - Abnormal; Notable for the following components:      Result Value   Creatinine, Ser 1.21 (*)    Albumin 3.4 (*)    GFR, Estimated 47 (*)    All other components within normal limits  CBC WITH DIFFERENTIAL/PLATELET - Abnormal; Notable for the following components:   RBC 3.63 (*)    Hemoglobin 10.8 (*)    HCT 33.4 (*)    RDW 15.9 (*)    All other components within normal limits  URINALYSIS, ROUTINE W REFLEX  MICROSCOPIC - Abnormal; Notable for the following components:   Color, Urine STRAW (*)    Specific Gravity, Urine 1.004 (*)    Glucose, UA 150 (*)    All other components within normal limits  LIPASE, BLOOD  CBG MONITORING, ED    EKG None  Radiology CT Abdomen Pelvis W Contrast  Result Date: 10/17/2022 CLINICAL DATA:  Diverticulitis. EXAM: CT ABDOMEN AND PELVIS WITH CONTRAST TECHNIQUE: Multidetector CT imaging of the abdomen and pelvis was performed using the standard protocol following bolus administration of intravenous contrast. RADIATION DOSE REDUCTION: This exam was performed according to the departmental dose-optimization program which includes automated exposure control, adjustment of the mA and/or kV according to patient size and/or use of iterative reconstruction technique. CONTRAST:  79m OMNIPAQUE IOHEXOL 350 MG/ML SOLN COMPARISON:  CT abdomen and pelvis 05/10/2020 FINDINGS: Lower chest: No consolidation or pleural effusion in the lung bases. Hepatobiliary: No focal liver abnormality. Suspected gallstones without evidence of acute pericholecystic inflammation. No biliary dilatation. Pancreas: Fatty atrophy greatest in the region of the pancreatic head. No ductal dilatation or acute inflammation. Spleen: Multiple hypoenhancing splenic lesions, indeterminate but with apparent stability from the 2021 CT favoring a benign etiology such as hemangiomas. Adrenals/Urinary Tract: Unremarkable adrenal glands. No suspicious renal mass, calculi, or  hydronephrosis. Moderately distended and otherwise unremarkable bladder. Stomach/Bowel: There is a small sliding hiatal hernia. There is predominantly left-sided colonic diverticulosis. There is no evidence of acute bowel inflammation or obstruction. Rounded soft tissue projecting into the posterior aspect of the rectal lumen is similar to the prior study, and no corresponding abnormality was present on an interval colonoscopy. The appendix is unremarkable. Vascular/Lymphatic: Mild abdominal aortic atherosclerosis without aneurysm. No enlarged lymph nodes. Reproductive: Status post hysterectomy. No adnexal masses. Other: No ascites or pneumoperitoneum. Musculoskeletal: No acute osseous abnormality or suspicious osseous lesion. Unchanged chronic L1 compression fracture. Multilevel ankylosis in the lower thoracic spine. IMPRESSION: 1. No acute abnormality identified in the abdomen or pelvis. 2. Colonic diverticulosis without evidence of acute diverticulitis. 3. Suspected cholelithiasis. 4.  Aortic Atherosclerosis (ICD10-I70.0). Electronically Signed   By: ALogan BoresM.D.   On: 10/17/2022 15:25    Procedures Procedures   Medications Ordered in ED Medications  iohexol (OMNIPAQUE) 350 MG/ML injection 75 mL (75 mLs Intravenous Contrast Given 10/17/22 1456)    ED Course/ Medical Decision Making/ A&P Clinical Course as of 10/17/22 1536  Fri Oct 17, 2022  1524 3 weeks of abdominal pain. RLQ, right groin. No A/A. Cramping sensation. Hx diverticulitis. Similar presentation in past. No F/N/V/D. No dysuria.  [CG]  1525 Dispo per imaging.  [CG]    Clinical Course User Index [CG] GAzucena Cecil PA-C   {    Medical Decision Making Amount and/or Complexity of Data Reviewed Labs: ordered. Radiology: ordered.  Risk Prescription drug management.  77year old female who presents to the emergency department with 3 weeks abdominal pain.  Given her history of diverticulosis and previous flares in the  right lower quadrant of diverticulitis as well as suspicion for underlying appendicitis although less likely we will go ahead and CT her abdomen.  She is not on peritonitic abdomen.  She is afebrile, hemodynamically stable, nonseptic and nontoxic appearing.  I also ordered basic labs.  She is declining pain medication on assessment. Care of patient is being handed off to CGenevive Bi PA-C at change of shift.  Pending CT scan findings she can likely be discharged if these are negative. Final Clinical Impression(s) / ED Diagnoses Final  diagnoses:  Lower abdominal pain    Rx / DC Orders ED Discharge Orders     None         Mickie Hillier, PA-C 10/17/22 1537    Fransico Meadow, MD 10/17/22 [phone]

## 2022-10-17 NOTE — Assessment & Plan Note (Signed)
Significant abdominal pain with broad differential: Possibly related to diverticulitis, appendiceal abnormality, less likely SBO given ability to pass flatus and bowel movement, does not appear related to ovarian etiology.  No urinary symptoms at this point, however possibly related to kidney stones.  Given the severity of her pain in office and her significant comorbidities, decided that we would be able to obtain abdominal imaging faster through the emergency room.  Contacted emergency room charge nurse for the day that the patient was coming to the ED.  The patient is agreeable to driving herself to the ED, hemodynamically stable in office.  Will update the inpatient FM TS team as well.  Patient verbalized understanding and left for the emergency room.

## 2022-10-21 ENCOUNTER — Telehealth: Payer: Self-pay

## 2022-10-21 ENCOUNTER — Other Ambulatory Visit: Payer: Self-pay | Admitting: Student

## 2022-10-21 DIAGNOSIS — R1031 Right lower quadrant pain: Secondary | ICD-10-CM

## 2022-10-21 NOTE — Transitions of Care (Post Inpatient/ED Visit) (Signed)
   10/21/2022  Name: Erin Coffey MRN: [id number] DOB: 1945/10/13  Today's TOC FU Call Status: Today's TOC FU Call Status:: Successful TOC FU Call Competed TOC FU Call Complete Date: 10/21/22  Transition Care Management Follow-up Telephone Call Date of Discharge: 10/19/22 Discharge [facility]) Type of Discharge: Emergency Department Reason for ED Visit: Other: (Abdominal pain) How have you been since you were released from the hospital?: Same (Patient continues with intermittent abdominal pain) Any questions or concerns?: No  Items Reviewed: Did you receive and understand the discharge instructions provided?: Yes Medications obtained and verified?: Yes (Medications Reviewed) Any new allergies since your discharge?: No Dietary orders reviewed?: No Do you have support at home?: Yes People in Home: child(ren), adult Name of Support/Comfort Primary Source: [place]  Follow up appointments reviewed: PCP Follow-up appointment confirmed?: [facility] Follow-up appointment confirmed?: Yes Date of Specialist follow-up appointment?: 12/01/22 Follow-Up Specialty Provider:: Amy Frederik Pear, PA-C Do you need transportation to your follow-up appointment?: No Do you understand care options if your condition(s) worsen?: Yes-patient verbalized understanding  SDOH Interventions Today    Flowsheet Row Most Recent Value  SDOH Interventions   Food Insecurity Interventions Intervention Not Indicated  Housing Interventions Intervention Not Indicated      Johnney Killian, RN, BSN, CCM Care Management Coordinator [facility] Phone: [phone]: [phone]

## 2022-10-21 NOTE — Telephone Encounter (Signed)
Patient calls nurse line requesting referral to GI specialist due to abdominal pain and recent results of CT abdomen.   She was seen in the ED on 3/8 for this concern. She has seen [facility] in the past and would like to receive referral to go back to this office.   Will forward request to PCP.   Talbot Grumbling, RN

## 2022-10-24 ENCOUNTER — Other Ambulatory Visit: Payer: Self-pay | Admitting: Neurosurgery

## 2022-10-24 DIAGNOSIS — D497 Neoplasm of unspecified behavior of endocrine glands and other parts of nervous system: Secondary | ICD-10-CM

## 2022-10-30 ENCOUNTER — Other Ambulatory Visit: Payer: Self-pay | Admitting: Family Medicine

## 2022-10-30 ENCOUNTER — Other Ambulatory Visit: Payer: Self-pay | Admitting: Student

## 2022-10-30 DIAGNOSIS — E1140 Type 2 diabetes mellitus with diabetic neuropathy, unspecified: Secondary | ICD-10-CM

## 2022-11-01 MED ORDER — METOPROLOL TARTRATE 25 MG PO TABS: 25.0000 mg | ORAL_TABLET | Freq: Two times a day (BID) | ORAL | 2 refills | Status: DC

## 2022-11-10 ENCOUNTER — Other Ambulatory Visit: Payer: Self-pay | Admitting: Student

## 2022-11-10 DIAGNOSIS — G8929 Other chronic pain: Secondary | ICD-10-CM

## 2022-11-24 ENCOUNTER — Ambulatory Visit
Admission: RE | Admit: 2022-11-24 | Discharge: 2022-11-24 | Disposition: A | Discharge status: Home / Self Care | Payer: Medicare Other | Source: Ambulatory Visit | Attending: Neurosurgery | Admitting: Neurosurgery

## 2022-11-24 DIAGNOSIS — D497 Neoplasm of unspecified behavior of endocrine glands and other parts of nervous system: Secondary | ICD-10-CM

## 2022-11-24 MED ORDER — GADOPICLENOL 0.5 MMOL/ML IV SOLN
10.0000 mL | Freq: Once | INTRAVENOUS | Status: AC | PRN
  Administered 2022-11-24: 10 mL via INTRAVENOUS

## 2022-11-27 ENCOUNTER — Other Ambulatory Visit: Payer: Self-pay | Admitting: Student

## 2022-11-27 DIAGNOSIS — R801 Persistent proteinuria, unspecified: Secondary | ICD-10-CM

## 2022-12-01 ENCOUNTER — Encounter: Payer: Self-pay | Admitting: Physician Assistant

## 2022-12-01 ENCOUNTER — Ambulatory Visit: Payer: Medicare Other | Admitting: Physician Assistant

## 2022-12-01 VITALS — BP 136/70 | HR 52 | Ht 68.0 in | Wt 284.0 lb

## 2022-12-01 DIAGNOSIS — M549 Dorsalgia, unspecified: Secondary | ICD-10-CM

## 2022-12-01 DIAGNOSIS — K5900 Constipation, unspecified: Secondary | ICD-10-CM | POA: Diagnosis not present

## 2022-12-01 DIAGNOSIS — R1031 Right lower quadrant pain: Secondary | ICD-10-CM | POA: Diagnosis not present

## 2022-12-01 NOTE — Patient Instructions (Addendum)
_______________________________________________________  If your blood pressure at your visit was 140/90 or greater, please contact your primary care physician to follow up on this. _______________________________________________________  If you are age 75 or older, your body mass index should be between 23-30. Your Body mass index is 43.18 kg/m. If this is out of the aforementioned range listed, please consider follow up with your Primary Care Provider. _______________________________________________________  The [facility] providers would like to encourage you to use [facility] to communicate with providers for non-urgent requests or questions.  Due to long hold times on the telephone, sending your provider a message by [facility] may be a faster and more efficient way to get a response.  Please allow 48 business hours for a response.  Please remember that this is for non-urgent requests.  _______________________________________________________  CONTINUE Tylenol 2 tablets twice daily  Please complete a bowel purge (to clean out your bowels). Please do the following:  Purchase a bottle of Miralax over the counter as well as a box of 5 mg dulcolax tablets.  Take 4 dulcolax tablets. Wait 1 hour. You will then drink 6-8 capfuls of Miralax mixed in an adequate amount of water/juice/gatorade (you may choose which of these liquids to drink) over the next 2-3 hours.  You should expect results within 1 to 6 hours after completing the bowel purge.  After completing the bowel purge you will take Miralax 17 gm in 8 ounces of water daily.  You can try heating pad for right sided back pain.  If back pain persists, please see orthopaedics.  Thank you for entrusting me with your care and choosing [facility].  Amy Esterwood, PA-C

## 2022-12-01 NOTE — Progress Notes (Signed)
Subjective:    Patient ID: Erin Coffey, female    DOB: 1946/04/04, 77 y.o.   MRN: [id number]  HPI Erin "Teryl Lucy" is a pleasant 77 year old African-American female, established with Dr. Myrtie Neither.  She was last seen in the office in October 2021 by myself, after an episode of right lower quadrant abdominal pain which had resolved by the time she was seen in our office.  She had had CT done in October 2021 that did not show any findings to explain the right lower quadrant pain.  She does have extensive degenerative changes in her lumbar spine and a chronic L1 compression fracture.  It was felt that she likely had musculoskeletal pain with radiculopathy.  Because of concern with CT finding of a soft tissue density/fullness of the rectum we proceeded with colonoscopy. Colonoscopy November 2021 showed multiple diverticuli in the left colon and was otherwise unremarkable, no polyps and no rectal abnormality. She comes back in today after recent ER visit on 10/17/2022 again with lower abdominal pain and right lower quadrant pain.  CT of the abdomen and pelvis was done with contrast, this showed suspected gallstones, no ductal dilation, multiple hypoenhancing splenic lesions indeterminant but stable since 2021, moderately distended bladder, small sliding hiatal hernia and left-sided diverticulosis, no evidence for bowel obstruction or diverticulitis. She was not treated with any specific medication but asked to start on a stool softener or laxative per the ER. She has been using Colace on a fairly regular basis, says that she was constipated at the time of that ER visit and since with Colace and using Dulcolax every couple of days she is able to have a bowel movement every 2 to 3 days.  She has not noted any melena or hematochezia.  She continues to have discomfort in the low right back and points to her lower back and right flank and around into the right abdomen as area of her discomfort.  She has had a  couple of falls in the past year, says she landed on her left side but had increased pain on the right side.  She was very concerned because she thought that the ER told her that she may have a blockage in her bowel.  Other medical problems include hypercoagulable disorder with prior history of DVT/PE-on Eliquis, osteoarthritis, chronic L1 compression fracture, degenerative disc disease, and is status post bilateral tubal ligation and hysterectomy.  She has history of DCIS of the breast, is on Arimidex.  Review of Systems Pertinent positive and negative review of systems were noted in the above HPI section.  All other review of systems was otherwise negative.   Outpatient Encounter Medications as of 12/01/2022  Medication Sig   acetaminophen (TYLENOL) 650 MG CR tablet Take 1,300 mg by mouth every 8 (eight) hours as needed for pain.   amLODipine (NORVASC) 5 MG tablet TAKE 1 TABLET BY MOUTH AT  BEDTIME   anastrozole (ARIMIDEX) 1 MG tablet Take 1 tablet (1 mg total) by mouth daily.   apixaban (ELIQUIS) 5 MG TABS tablet Take 1 tablet (5 mg total) by mouth 2 (two) times daily.   atorvastatin (LIPITOR) 40 MG tablet TAKE 1 TABLET BY MOUTH  DAILY   baclofen (LIORESAL) 10 MG tablet Take 1 tablet (10 mg total) by mouth daily as needed for muscle spasms.   Blood Glucose Monitoring Suppl (ONETOUCH VERIO) w/Device KIT Use as instructed to test sugars once daily.  Dx Code: E11.9   cephALEXin (KEFLEX) 500 MG capsule Take 1  capsule (500 mg total) by mouth 2 (two) times daily.   CHOLECALCIFEROL PO Take 2,200 Units by mouth daily.   dapagliflozin propanediol (FARXIGA) 10 MG TABS tablet Take 1 tablet by mouth once daily   fish oil-omega-3 fatty acids 1000 MG capsule Take 1 g by mouth daily.   gabapentin (NEURONTIN) 100 MG capsule TAKE 1 CAPSULE BY MOUTH TWICE  DAILY   glucose blood (ONETOUCH VERIO) test strip Use as instructed to test sugars once daily.  Dx Code: E11.9   iron polysaccharides (NIFEREX) 150 MG  capsule    ketoconazole (NIZORAL) 2 % cream Apply to both feet once daily for 4 weeks.   Lancet Devices (ONE TOUCH DELICA LANCING DEV) MISC Use as instructed to test sugars once daily.  Dx Code: E11.9   metoprolol tartrate (LOPRESSOR) 25 MG tablet Take 1 tablet (25 mg total) by mouth 2 (two) times daily.   omeprazole (PRILOSEC) 20 MG capsule Take 1 capsule (20 mg total) by mouth daily.   ondansetron (ZOFRAN-ODT) 4 MG disintegrating tablet Take 1 tablet (4 mg total) by mouth every 8 (eight) hours as needed for nausea or vomiting.   OneTouch Delica Lancets 33G MISC Use as instructed to test sugars once daily.  Dx Code: E11.9   polyethylene glycol powder (GLYCOLAX/MIRALAX) powder Take 17 g by mouth 2 (two) times daily as needed. (Patient taking differently: Take 17 g by mouth daily as needed (constipation).)   traMADol (ULTRAM) 50 MG tablet TAKE 1 TABLET BY MOUTH EVERY 6 HOURS AS NEEDED   vitamin C (ASCORBIC ACID) 500 MG tablet Take 500 mg by mouth daily.   losartan (COZAAR) 100 MG tablet Take 1 tablet (100 mg total) by mouth daily.   No facility-administered encounter medications on file as of 12/01/2022.   Allergies  Allergen Reactions   Lisinopril Cough   Patient Active Problem List   Diagnosis Date Noted   Family history of breast cancer 03/05/2022   Ductal carcinoma in situ (DCIS) of right breast 03/03/2022   Fatigue 09/04/2021   Closed compression fracture of first lumbar vertebra 04/09/2021   BPPV (benign paroxysmal positional vertigo), left 04/04/2021   Peripheral vertigo 01/15/2021   Pituitary mass 12/31/2020   Left anterior knee pain 12/13/2020   Chronic kidney disease, stage 3a 12/13/2020   Sciatic nerve pain, left 05/30/2020   Abdominal pain 04/30/2020   Boil of buttock 04/19/2020   Body mass index (BMI) 40.0-44.9, adult 01/26/2020   Mild coronary artery disease 10/2019   History of spinal fracture 03/24/2019   Age-related osteoporosis with current pathological fracture  03/24/2019   Nontraumatic complete tear of left rotator cuff 03/24/2019   Primary hypercoagulable state 03/07/2019   Osteoporotic compression fracture of spine 03/07/2019   Pulmonary emboli 03/03/2019   Chronic left hip pain 08/17/2018   Left sided numbness 07/22/2018   Onychogryphosis 05/31/2018   Low back pain potentially associated with radiculopathy 01/29/2018   Diverticulosis of colon 03/24/2017   Hypertensive retinopathy of right eye 01/20/2017   Hypermetropia of both eyes 01/20/2017   Back pain 01/05/2017   Chronic kidney disease 04/29/2016   Anemia 04/29/2016   Rotator cuff dysfunction 06/25/2015   Long term (current) use of anticoagulants 05/08/2011   Hyperlipidemia associated with type 2 diabetes mellitus 08/08/2008   Type 2 diabetes mellitus with hemoglobin A1c goal of less than 7.5% 06/05/2008   HYPERTENSION, BENIGN ESSENTIAL 11/27/2006   Morbid (severe) obesity due to excess calories 10/08/2006   GASTROESOPHAGEAL REFLUX, NO ESOPHAGITIS 10/08/2006  Osteoarthritis, multiple sites 10/08/2006   Social History   Socioeconomic History   Marital status: Widowed    Spouse name: Not on file   Number of children: 3   Years of education: 10   Highest education level: 10th grade  Occupational History   Occupation: Retired- Multimedia programmer: [employer]  Tobacco Use   Smoking status: Never   Smokeless tobacco: Never  Building services engineer Use: Never used  Substance and Sexual Activity   Alcohol use: No    Alcohol/week: 0.0 standard drinks of alcohol   Drug use: No   Sexual activity: Not Currently  Other Topics Concern   Not on file  Social History Narrative   Patient lives alone in [place], she is a widow.   Patient has 3 children. Two daughters live locally, one son in [place].    Patient is very active in her church and volunteer work to stay busy.    Patient enjoys cooking, spending time with family, and crossword puzzles.       Social Determinants of Health    Financial Resource Strain: Low Risk  (03/05/2022)   Overall Financial Resource Strain (CARDIA)    Difficulty of Paying Living Expenses: Not very hard  Food Insecurity: No Food Insecurity (10/21/2022)   Hunger Vital Sign    Worried About Running Out of Food in the Last Year: Never true    Ran Out of Food in the Last Year: Never true  Transportation Needs: No Transportation Needs (03/05/2022)   PRAPARE - Administrator, Civil Service (Medical): No    Lack of Transportation (Non-Medical): No  Physical Activity: Sufficiently Active (03/03/2019)   Exercise Vital Sign    Days of Exercise per Week: 3 days    Minutes of Exercise per Session: 60 min  Stress: No Stress Concern Present (03/05/2022)   [employer] of Occupational Health - Occupational Stress Questionnaire    Feeling of Stress : Not at all  Social Connections: Moderately Integrated (01/05/2020)   Social Connection and Isolation Panel [NHANES]    Frequency of Communication with Friends and Family: More than three times a week    Frequency of Social Gatherings with Friends and Family: More than three times a week    Attends Religious Services: More than 4 times per year    Active Member of [employer] or Organizations: Yes    Attends Banker Meetings: More than 4 times per year    Marital Status: Widowed  Intimate Partner Violence: Not At Risk (03/03/2019)   Humiliation, Afraid, Rape, and Kick questionnaire    Fear of Current or Ex-Partner: No    Emotionally Abused: No    Physically Abused: No    Sexually Abused: No    Ms. Kahre's family history includes Breast cancer in her cousin; Cirrhosis in her sister; Diabetes in her brother, mother, sister, and sister; Heart disease in her father, mother, and sister; Hypertension in her brother, father, and mother; Kidney disease in her sister; Leukemia (age of onset: 57) in her sister.      Objective:    Vitals:   12/01/22 0817  BP: 136/70  Pulse: (!) 52     Physical Exam. Well-developed well-nourished obese older AA female  in no acute distress. Pleasant  Height, Weight, 284 BMI 43  HEENT; nontraumatic normocephalic, EOMI, PE R LA, sclera anicteric. Oropharynx;not examined today  Neck; supple, no JVD Cardiovascular; regular rate and rhythm with S1-S2, no murmur rub  or gallop Pulmonary; Clear bilaterally Abdomen; soft, obese, nondistended, there is some tenderness with deep palpation along the right iliac crest and around into the right back ,no palpable mass or hepatosplenomegaly, bowel sounds are active Rectal; not done today Skin; benign exam, no jaundice rash or appreciable lesions Extremities; no clubbing cyanosis or edema skin warm and dry Neuro/Psych; alert and oriented x4, grossly nonfocal mood and affect appropriate        Assessment & Plan:   #55 77 year old African-American female with complaints of right lower quadrant pain.  She had similar complaints in 2021 when she was seen here and at that time also felt to have a definite musculoskeletal component concerns for radiculopathy with known degenerative disc disease and chronic L1 compression fracture.  Those symptoms had subsided for a period of time and have recurred over the past months. ER visit on 10/17/2022 for same with CT of the abdomen and pelvis as outlined above not showing any acute abnormality in the abdomen to account for her pain, left-sided diverticulosis, no diverticulitis, probable gallstones, mildly distended bladder, and stable hypoenhancing splenic lesions (stable since 2021)  I think her pain is very  likely musculoskeletal in etiology  #2 constipation #3 diverticulosis left colon #4 obesity- BMI 43 #5.  Hypercoagulable disorder prior history of DVT/PE-on Eliquis #6 status post tubal ligation and hysterectomy #7 colon cancer screening-up-to-date with colonoscopy last done November 2021-no polyps  Plan; have offered a MiraLAX purge, for constipation,  then we discussed using MiraLAX 17 g in 8 ounces of water on a daily basis for her constipation.  She will continue liberal water intake, higher fiber diet and has been eating prunes a couple of times per week. Try moist heat in the right lower abdomen right back, regular strength Tylenol 2 p.o. 3 times daily as needed for pain Have asked her to follow-up with her orthopedist for the right lower back and right lower quadrant abdominal pain   Tosh Glaze S Kristain Hu PA-C 12/01/2022   Cc: McDiarmid, Leighton Roach, MD

## 2022-12-02 ENCOUNTER — Telehealth: Payer: Self-pay | Admitting: Hematology and Oncology

## 2022-12-02 NOTE — Telephone Encounter (Signed)
Spoke with patient confirming appointment change  

## 2022-12-02 NOTE — Progress Notes (Signed)
____________________________________________________________  Attending physician addendum:  Thank you for sending this case to me. I have reviewed the entire note and agree with the plan.  If this treatment plan does not provide sufficient relief of constipation, then we can certainly see her back to try prescription meds such as Linzess/Amitiza or Trulance.  Amada Jupiter, MD  ____________________________________________________________

## 2022-12-04 ENCOUNTER — Other Ambulatory Visit: Payer: Self-pay | Admitting: Student

## 2022-12-04 DIAGNOSIS — R801 Persistent proteinuria, unspecified: Secondary | ICD-10-CM

## 2022-12-05 ENCOUNTER — Ambulatory Visit: Payer: Medicare Other | Admitting: Podiatry

## 2022-12-05 DIAGNOSIS — M79675 Pain in left toe(s): Secondary | ICD-10-CM | POA: Diagnosis not present

## 2022-12-05 DIAGNOSIS — M2142 Flat foot [pes planus] (acquired), left foot: Secondary | ICD-10-CM

## 2022-12-05 DIAGNOSIS — M2141 Flat foot [pes planus] (acquired), right foot: Secondary | ICD-10-CM

## 2022-12-05 DIAGNOSIS — M79674 Pain in right toe(s): Secondary | ICD-10-CM | POA: Diagnosis not present

## 2022-12-05 DIAGNOSIS — E119 Type 2 diabetes mellitus without complications: Secondary | ICD-10-CM

## 2022-12-05 DIAGNOSIS — L84 Corns and callosities: Secondary | ICD-10-CM | POA: Diagnosis not present

## 2022-12-05 DIAGNOSIS — B351 Tinea unguium: Secondary | ICD-10-CM

## 2022-12-08 ENCOUNTER — Encounter: Payer: Self-pay | Admitting: Podiatry

## 2022-12-08 NOTE — Progress Notes (Signed)
ANNUAL DIABETIC FOOT EXAM  Subjective: Erin Coffey presents today for annual diabetic foot examination.  Chief Complaint  Patient presents with   Diabetes    DFC BS - CHECKS IT OCCASIONALLY  A1C - 6.1 LVPCP - 10/2022    Patient confirms h/o diabetes.  Patient denies any h/o foot wounds.  Patient denies any numbness, tingling, burning, or pins/needle sensation in feet.  Patient has been diagnosed with neuropathy.  Risk factors: diabetes, h/o DVT, h/o PE, HTN, CAD, CKD, hyperlipidemia.  Alfredo Martinez, MD is patient's PCP.  Past Medical History:  Diagnosis Date   Allergy    Anemia, iron deficiency    ANXIETY, SITUATIONAL 04/06/2009   Qualifier: Diagnosis of  By: Sandi Mealy  MD, Stephanie     Arthritis    Back pain, thoracic 12/20/2012   Carpal tunnel syndrome 12/07/2014   Cholelithiasis 06/25/2012   Chronic kidney disease    DM type 2 goal A1C below 7.5 06/05/2008   Qualifier: Diagnosis of  By: Sandi Mealy  MD, Judeth Cornfield     Family history of breast cancer 03/05/2022   GASTROESOPHAGEAL REFLUX, NO ESOPHAGITIS 10/08/2006   Qualifier: Diagnosis of  By: De Burrs, unspecified 10/21/2007   Qualifier: Diagnosis of  By: Gavin Potters MD, [name]     History of DVT (deep vein thrombosis) 05/05/2013   on Eliquis   HYPERLIPIDEMIA 08/08/2008   Qualifier: Diagnosis of  By: Sandi Mealy  MD, Stephanie     HYPERTENSION, BENIGN ESSENTIAL 11/27/2006   Qualifier: Diagnosis of  ByGavin Potters MD, [name]     Mild coronary artery disease 10/2019   Coronary CT Angiogram: Coronary calcium score 46.Normal coronary origin.  Left dominance with tortuous coronary arteries.  Nondominant RCA - prox < 25% followed by ectasia (dilated to 5.5 mm) mixed plaque in distal vessel.  LM- normal . LAD: minimal mixed plaque in ostial & proximal (<25%) with brief IM bridging in mLAD. Mild mid-distal (25-49%), RI - prox <25%, Large LCx- minimal (<25%) mid-dista   Mild coronary artery disease    Personal history of colonic  polyps 10/24/2009   Qualifier: Diagnosis of  By: Arlyce Dice MD, Barbette Hair    [place] 08/08/2009   Qualifier: Diagnosis of  By: [place], [name]     Pituitary adenoma [facility])    Polymyalgia rheumatica (HCC) 10/02/2009   Qualifier: Diagnosis of  By: Hulen Luster MD, Fleet Contras     Pulmonary embolism [facility]) - On long Term anticoagulation (Z79.01) 04/28/2011   Converted from warfarin to Eliquis; on long-term anticoagulation.   Thyroid disease    Trigger ring finger of left hand 11/02/2014   Patient Active Problem List   Diagnosis Date Noted   Family history of breast cancer 03/05/2022   Ductal carcinoma in situ (DCIS) of right breast 03/03/2022   Fatigue 09/04/2021   Closed compression fracture of first lumbar vertebra (HCC) 04/09/2021   BPPV (benign paroxysmal positional vertigo), left 04/04/2021   Peripheral vertigo 01/15/2021   Pituitary mass (HCC) 12/31/2020   Left anterior knee pain 12/13/2020   Chronic kidney disease, stage 3a (HCC) 12/13/2020   Sciatic nerve pain, left 05/30/2020   Abdominal pain 04/30/2020   Boil of buttock 04/19/2020   Body mass index (BMI) 40.0-44.9, adult (HCC) 01/26/2020   Mild coronary artery disease 10/2019   History of spinal fracture 03/24/2019   Age-related osteoporosis with current pathological fracture 03/24/2019   Nontraumatic complete tear of left rotator cuff 03/24/2019   Primary hypercoagulable state (HCC) 03/07/2019   Osteoporotic compression  fracture of spine (HCC) 03/07/2019   Pulmonary emboli (HCC) 03/03/2019   Chronic left hip pain 08/17/2018   Left sided numbness 07/22/2018   Onychogryphosis 05/31/2018   Low back pain potentially associated with radiculopathy 01/29/2018   Diverticulosis of colon 03/24/2017   Hypertensive retinopathy of right eye 01/20/2017   Hypermetropia of both eyes 01/20/2017   Back pain 01/05/2017   Chronic kidney disease 04/29/2016   Anemia 04/29/2016   Rotator cuff dysfunction 06/25/2015   Long term (current) use of  anticoagulants 05/08/2011   Hyperlipidemia associated with type 2 diabetes mellitus (HCC) 08/08/2008   Type 2 diabetes mellitus with hemoglobin A1c goal of less than 7.5% (HCC) 06/05/2008   HYPERTENSION, BENIGN ESSENTIAL 11/27/2006   Morbid (severe) obesity due to excess calories (HCC) 10/08/2006   GASTROESOPHAGEAL REFLUX, NO ESOPHAGITIS 10/08/2006   Osteoarthritis, multiple sites 10/08/2006   Past Surgical History:  Procedure Laterality Date   ABDOMINAL HYSTERECTOMY     BREAST LUMPECTOMY WITH RADIOACTIVE SEED LOCALIZATION Right 03/25/2022   Procedure: RIGHT BREAST SEED LUMPECTOMY;  Surgeon: Harriette Bouillon, MD;  Location: [facility];  Service: General;  Laterality: Right;   CARDIAC CATHETERIZATION  04/29/2011   (Dr. Riley Kill - [place]): LM - normal. LAD with 1 major Diag - normal, bifurcating Ramus - normal, Large (6-7 mm) Dominant LCx with 1 OM, 2 LPL branches & LPDA - non significant disease; small non-dominant RCA.     CARDIAC EVENT MONITOR  09/2019   Mostly sinus rhythm, average rate 60 bpm.  Range 45 to 190 bpm.  No critical events indicating any arrhythmias or pauses.  For patient responses noted PVCs.  Less than 1% PVCs noted.    CARPAL TUNNEL RELEASE Right 1996   COLONOSCOPY     KNEE ARTHROSCOPY  Bilateral   masctomy     TRANSTHORACIC ECHOCARDIOGRAM  09/2019   Normal EF - 60 to 65%.  Mild LVH.  GR 1 DD.  Normal RV.  Trivial MR.  Normal IVC.  Normal PA pressures.   TUBAL LIGATION     UPPER GASTROINTESTINAL ENDOSCOPY     Current Outpatient Medications on File Prior to Visit  Medication Sig Dispense Refill   acetaminophen (TYLENOL) 650 MG CR tablet Take 1,300 mg by mouth every 8 (eight) hours as needed for pain.     amLODipine (NORVASC) 5 MG tablet TAKE 1 TABLET BY MOUTH AT  BEDTIME 100 tablet 2   anastrozole (ARIMIDEX) 1 MG tablet Take 1 tablet (1 mg total) by mouth daily. 90 tablet 3   apixaban (ELIQUIS) 5 MG TABS tablet Take 1 tablet (5 mg total) by mouth 2  (two) times daily. 180 tablet 3   atorvastatin (LIPITOR) 40 MG tablet TAKE 1 TABLET BY MOUTH  DAILY 100 tablet 2   baclofen (LIORESAL) 10 MG tablet Take 1 tablet (10 mg total) by mouth daily as needed for muscle spasms. 30 each 0   Blood Glucose Monitoring Suppl (ONETOUCH VERIO) w/Device KIT Use as instructed to test sugars once daily.  Dx Code: E11.9 1 kit 0   cephALEXin (KEFLEX) 500 MG capsule Take 1 capsule (500 mg total) by mouth 2 (two) times daily. 14 capsule 0   CHOLECALCIFEROL PO Take 2,200 Units by mouth daily.     dapagliflozin propanediol (FARXIGA) 10 MG TABS tablet Take 1 tablet by mouth once daily 90 tablet 0   fish oil-omega-3 fatty acids 1000 MG capsule Take 1 g by mouth daily.     gabapentin (NEURONTIN) 100 MG  capsule TAKE 1 CAPSULE BY MOUTH TWICE  DAILY 200 capsule 0   glucose blood (ONETOUCH VERIO) test strip Use as instructed to test sugars once daily.  Dx Code: E11.9 100 each 12   iron polysaccharides (NIFEREX) 150 MG capsule      ketoconazole (NIZORAL) 2 % cream Apply to both feet once daily for 4 weeks. 30 g 1   Lancet Devices (ONE TOUCH DELICA LANCING DEV) MISC Use as instructed to test sugars once daily.  Dx Code: E11.9 1 each 0   losartan (COZAAR) 100 MG tablet Take 1 tablet (100 mg total) by mouth daily. 90 tablet 1   metoprolol tartrate (LOPRESSOR) 25 MG tablet Take 1 tablet (25 mg total) by mouth 2 (two) times daily. 200 tablet 2   omeprazole (PRILOSEC) 20 MG capsule Take 1 capsule (20 mg total) by mouth daily. 30 capsule 0   ondansetron (ZOFRAN-ODT) 4 MG disintegrating tablet Take 1 tablet (4 mg total) by mouth every 8 (eight) hours as needed for nausea or vomiting. 15 tablet 0   OneTouch Delica Lancets 33G MISC Use as instructed to test sugars once daily.  Dx Code: E11.9 100 each 12   polyethylene glycol powder (GLYCOLAX/MIRALAX) powder Take 17 g by mouth 2 (two) times daily as needed. (Patient taking differently: Take 17 g by mouth daily as needed (constipation).)  3350 g 1   traMADol (ULTRAM) 50 MG tablet TAKE 1 TABLET BY MOUTH EVERY 6 HOURS AS NEEDED 30 tablet 0   vitamin C (ASCORBIC ACID) 500 MG tablet Take 500 mg by mouth daily.     No current facility-administered medications on file prior to visit.    Allergies  Allergen Reactions   Lisinopril Cough   Social History   Occupational History   Occupation: Retired- Multimedia programmer: [employer]  Tobacco Use   Smoking status: Never   Smokeless tobacco: Never  Vaping Use   Vaping Use: Never used  Substance and Sexual Activity   Alcohol use: No    Alcohol/week: 0.0 standard drinks of alcohol   Drug use: No   Sexual activity: Not Currently   Family History  Problem Relation Age of Onset   Diabetes Mother    Heart disease Mother    Hypertension Mother    Heart disease Father    Hypertension Father    Diabetes Sister    Heart disease Sister    Leukemia Sister 29   Cirrhosis Sister        alcohol   Kidney disease Sister    Diabetes Sister    Hypertension Brother    Diabetes Brother    Breast cancer Cousin        dx 75s; maternal female cousin   Colon cancer Neg Hx    Pancreatic cancer Neg Hx    Esophageal cancer Neg Hx    Immunization History  Administered Date(s) Administered   Fluad Quad(high Dose 65+) 04/19/2020, 05/08/2021, 05/12/2022   Influenza Split 05/15/2011, 04/29/2012   Influenza Whole 06/05/2008, 08/08/2009   Influenza,inj,Quad PF,6+ Mos 05/05/2013, 06/08/2014, 04/19/2015, 04/29/2016, 08/21/2016, 04/09/2017, 05/31/2018, 05/05/2019   PFIZER(Purple Top)SARS-COV-2 Vaccination 10/01/2019, 10/25/2019, 08/18/2020   Pfizer Covid-19 Vaccine Bivalent Booster 32yrs & up 09/02/2021   Pneumococcal Conjugate-13 05/05/2013   Pneumococcal Polysaccharide-23 08/24/2014   Td 02/08/2002   Tdap 01/20/2013   Zoster Recombinat (Shingrix) 12/20/2012   Zoster, Live 12/20/2012     Review of Systems: Negative except as noted in the HPI.   Objective: There were no  vitals filed for  this visit.  Erin Coffey is a pleasant 77 y.o. female in NAD. AAO X 3.  Vascular Examination: CFT <3 seconds b/l LE. Palpable pedal pulses b/l LE. Pedal hair sparse. No pain with calf compression b/l. Trace edema noted BLE. No cyanosis or clubbing noted b/l LE.  Dermatological Examination: Pedal skin is warm and supple b/l LE. No open wounds b/l LE. No interdigital macerations noted b/l LE. Toenails 1-5 b/l elongated, discolored, dystrophic, thickened, crumbly with subungual debris and tenderness to dorsal palpation. Hyperkeratotic lesion(s) submet head 5 b/l.  No erythema, no edema, no drainage, no fluctuance.  Neurological Examination: Pt has subjective symptoms of neuropathy. Protective sensation intact 5/5 intact bilaterally with 10g monofilament b/l. Vibratory sensation intact b/l.  Musculoskeletal Examination: Muscle strength 5/5 to all lower extremity muscle groups bilaterally. Pes planus deformity noted bilateral LE. Patient ambulates independent of any assistive aids.  Lab Results  Component Value Date   HGBA1C 6.1 08/29/2022   ADA Risk Categorization: Low Risk :  Patient has all of the following: Intact protective sensation No prior foot ulcer  No severe deformity Pedal pulses present  Assessment: 1. Pain due to onychomycosis of toenails of both feet   2. Callus   3. Controlled type 2 diabetes mellitus without complication, without long-term current use of insulin (HCC)     Plan: -Consent given for treatment as described below: -Diabetic foot examination performed today. -Continue foot and shoe inspections daily. Monitor blood glucose per PCP/Endocrinologist's recommendations. -Patient to continue soft, supportive shoe gear daily. -Toenails 1-5 b/l were debrided in length and girth with sterile nail nippers and dremel without iatrogenic bleeding.  -Callus(es) submet head 5 b/l pared utilizing sharp debridement with sterile blade without complication or  incident. Total number debrided =2. -Patient/POA to call should there be question/concern in the interim. Return in about 3 months (around 03/06/2023).  Freddie Breech, DPM

## 2022-12-12 ENCOUNTER — Telehealth: Payer: Self-pay

## 2022-12-12 ENCOUNTER — Other Ambulatory Visit (HOSPITAL_COMMUNITY): Payer: Self-pay

## 2022-12-12 NOTE — Telephone Encounter (Signed)
Patient calls nurse line in regards to [place].   She reports she is in the donut hole and her copay is ~400 dollars. She reports she just paid over 300 for her Eliquis and can not afford at this time.   Will forward to pharmacy team and PCP for assistance.

## 2022-12-15 ENCOUNTER — Other Ambulatory Visit: Payer: Self-pay

## 2022-12-15 ENCOUNTER — Inpatient Hospital Stay: Payer: Medicare Other | Attending: Hematology and Oncology | Admitting: Adult Health

## 2022-12-15 ENCOUNTER — Ambulatory Visit: Payer: Medicare Other | Admitting: Hematology and Oncology

## 2022-12-15 ENCOUNTER — Inpatient Hospital Stay: Payer: Medicare Other | Admitting: Hematology and Oncology

## 2022-12-15 ENCOUNTER — Encounter: Payer: Self-pay | Admitting: Adult Health

## 2022-12-15 VITALS — BP 167/60 | HR 62 | Temp 97.5°F | Resp 18 | Wt 284.9 lb

## 2022-12-15 DIAGNOSIS — D0511 Intraductal carcinoma in situ of right breast: Secondary | ICD-10-CM | POA: Insufficient documentation

## 2022-12-15 DIAGNOSIS — Z8249 Family history of ischemic heart disease and other diseases of the circulatory system: Secondary | ICD-10-CM | POA: Diagnosis not present

## 2022-12-15 DIAGNOSIS — M069 Rheumatoid arthritis, unspecified: Secondary | ICD-10-CM | POA: Insufficient documentation

## 2022-12-15 DIAGNOSIS — I10 Essential (primary) hypertension: Secondary | ICD-10-CM | POA: Insufficient documentation

## 2022-12-15 DIAGNOSIS — Z86718 Personal history of other venous thrombosis and embolism: Secondary | ICD-10-CM | POA: Diagnosis not present

## 2022-12-15 DIAGNOSIS — Z841 Family history of disorders of kidney and ureter: Secondary | ICD-10-CM | POA: Insufficient documentation

## 2022-12-15 DIAGNOSIS — Z806 Family history of leukemia: Secondary | ICD-10-CM | POA: Diagnosis not present

## 2022-12-15 DIAGNOSIS — Z7901 Long term (current) use of anticoagulants: Secondary | ICD-10-CM | POA: Diagnosis not present

## 2022-12-15 DIAGNOSIS — Z90710 Acquired absence of both cervix and uterus: Secondary | ICD-10-CM | POA: Insufficient documentation

## 2022-12-15 DIAGNOSIS — Z79899 Other long term (current) drug therapy: Secondary | ICD-10-CM | POA: Diagnosis not present

## 2022-12-15 DIAGNOSIS — Z86711 Personal history of pulmonary embolism: Secondary | ICD-10-CM | POA: Diagnosis not present

## 2022-12-15 DIAGNOSIS — E119 Type 2 diabetes mellitus without complications: Secondary | ICD-10-CM | POA: Diagnosis not present

## 2022-12-15 DIAGNOSIS — Z79811 Long term (current) use of aromatase inhibitors: Secondary | ICD-10-CM | POA: Diagnosis not present

## 2022-12-15 DIAGNOSIS — Z803 Family history of malignant neoplasm of breast: Secondary | ICD-10-CM | POA: Diagnosis not present

## 2022-12-15 DIAGNOSIS — Z8719 Personal history of other diseases of the digestive system: Secondary | ICD-10-CM | POA: Insufficient documentation

## 2022-12-15 DIAGNOSIS — Z888 Allergy status to other drugs, medicaments and biological substances status: Secondary | ICD-10-CM | POA: Insufficient documentation

## 2022-12-15 DIAGNOSIS — D649 Anemia, unspecified: Secondary | ICD-10-CM | POA: Insufficient documentation

## 2022-12-15 DIAGNOSIS — M2550 Pain in unspecified joint: Secondary | ICD-10-CM | POA: Diagnosis not present

## 2022-12-15 DIAGNOSIS — Z8379 Family history of other diseases of the digestive system: Secondary | ICD-10-CM | POA: Insufficient documentation

## 2022-12-15 DIAGNOSIS — Z833 Family history of diabetes mellitus: Secondary | ICD-10-CM | POA: Insufficient documentation

## 2022-12-15 NOTE — Assessment & Plan Note (Signed)
Erin Coffey is a 77 year old woman with history of right breast DCIS diagnosed in July 2023 status postlumpectomy, adjuvant radiation, and antiestrogen therapy with anastrozole which began in November 2023.  Right breast DCIS: She has no clinical signs of breast cancer recurrence.  She is scheduled for her mammogram next month which she plans to undergo.  She will continue anastrozole daily as she is tolerating this well. Bone health: Considering her age her previous rheumatoid arthritis treatments and history of bone density loss on treatment with Fosamax she is at risk for further loss with her treatment on anastrozole.  She is scheduled for bone density testing on January 26, 2023.  Discussed bone building activities. Health maintenance: We reviewed healthy diet and activity as she is able considering her comorbidities and rheumatoid arthritis.  She will continue to follow-up with her primary care provider regularly.  Erin Coffey will return in 6 months for follow-up.  She knows to call for any questions or concerns that may arise between now and her next appointment.

## 2022-12-15 NOTE — Progress Notes (Signed)
[facility] Cancer Follow up:    Erin Martinez, MD [address] [place] [address]   DIAGNOSIS:  Cancer Staging  Ductal carcinoma in situ (DCIS) of right breast Staging form: Breast, AJCC 8th Edition - Clinical stage from 03/05/2022: Stage 0 (cTis (DCIS), cN0, cM0, ER+, PR+, HER2: Not Assessed) - Unsigned Stage prefix: Initial diagnosis Nuclear grade: G1 Laterality: Right Staged by: Pathologist and managing physician Stage used in treatment planning: Yes National guidelines used in treatment planning: Yes   SUMMARY OF ONCOLOGIC HISTORY: Oncology History  Ductal carcinoma in situ (DCIS) of right breast  01/20/2022 Mammogram   Digital screening mammogram with incomplete evaluation.  Right breast focal asymmetry indeterminate. Diagnostic mammogram and ultrasound showed no correlate.  Stereotactic biopsy was recommended   02/14/2022 Pathology Results   Pathology from the right breast needle core biopsy showed DCIS, low to intermediate nuclear grade, cribriform and micropapillary types without necrosis and foci suspicious for microinvasion.  Negative for microcalcifications.  DCIS measured 2.2 mm in greatest extent   03/03/2022 Initial Diagnosis   Ductal carcinoma in situ (DCIS) of right breast   03/25/2022 Surgery   She had a right breast lumpectomy which showed DCIS abutting the posterior margin of resection, additional breast tissue from posterior margin negative for DCIS.  No invasive carcinoma.  Prognostics from prior biopsy showed 100% ER positive and 100% PR positive.   04/30/2022 - 05/27/2022 Radiation Therapy   Site Technique Total Dose (Gy) Dose per Fx (Gy) Completed Fx Beam Energies  Breast, Right: Breast_R 3D 42.56/42.56 2.66 16/16 15X, 10XFFF  Breast, Right: Breast_R_Bst 3D 8/8 2 4/4 10X     06/2022 -  Anti-estrogen oral therapy   Anastrozole     CURRENT THERAPY: Anastrozole  INTERVAL HISTORY: Erin Coffey 77 y.o. female returns for  follow-up of her noninvasive breast cancer.  She continues on anastrozole daily and she is tolerating this well.  She has occasional tenderness in her right breast that is improving every day.  She has a history of rheumatoid arthritis so she does experience joint aches and pains that are common and usual for her.  These are unchanged.  She is scheduled for mammogram and bone density testing on January 26, 2023 at [facility] mammography.    Patient Active Problem List   Diagnosis Date Noted   Family history of breast cancer 03/05/2022   Ductal carcinoma in situ (DCIS) of right breast 03/03/2022   Fatigue 09/04/2021   Closed compression fracture of first lumbar vertebra (HCC) 04/09/2021   BPPV (benign paroxysmal positional vertigo), left 04/04/2021   Peripheral vertigo 01/15/2021   Pituitary mass (HCC) 12/31/2020   Left anterior knee pain 12/13/2020   Chronic kidney disease, stage 3a (HCC) 12/13/2020   Sciatic nerve pain, left 05/30/2020   Abdominal pain 04/30/2020   Boil of buttock 04/19/2020   Body mass index (BMI) 40.0-44.9, adult (HCC) 01/26/2020   Mild coronary artery disease 10/2019   History of spinal fracture 03/24/2019   Age-related osteoporosis with current pathological fracture 03/24/2019   Nontraumatic complete tear of left rotator cuff 03/24/2019   Primary hypercoagulable state (HCC) 03/07/2019   Osteoporotic compression fracture of spine (HCC) 03/07/2019   Pulmonary emboli (HCC) 03/03/2019   Chronic left hip pain 08/17/2018   Left sided numbness 07/22/2018   Onychogryphosis 05/31/2018   Low back pain potentially associated with radiculopathy 01/29/2018   Diverticulosis of colon 03/24/2017   Hypertensive retinopathy of right eye 01/20/2017   Hypermetropia of both  eyes 01/20/2017   Back pain 01/05/2017   Chronic kidney disease 04/29/2016   Anemia 04/29/2016   Rotator cuff dysfunction 06/25/2015   Long term (current) use of anticoagulants 05/08/2011   Hyperlipidemia associated  with type 2 diabetes mellitus (HCC) 08/08/2008   Type 2 diabetes mellitus with hemoglobin A1c goal of less than 7.5% (HCC) 06/05/2008   HYPERTENSION, BENIGN ESSENTIAL 11/27/2006   Morbid (severe) obesity due to excess calories (HCC) 10/08/2006   GASTROESOPHAGEAL REFLUX, NO ESOPHAGITIS 10/08/2006   Osteoarthritis, multiple sites 10/08/2006    is allergic to lisinopril.  MEDICAL HISTORY: Past Medical History:  Diagnosis Date   Allergy    Anemia, iron deficiency    ANXIETY, SITUATIONAL 04/06/2009   Qualifier: Diagnosis of  By: Sandi Mealy  MD, Stephanie     Arthritis    Back pain, thoracic 12/20/2012   Carpal tunnel syndrome 12/07/2014   Cholelithiasis 06/25/2012   Chronic kidney disease    DM type 2 goal A1C below 7.5 06/05/2008   Qualifier: Diagnosis of  By: Sandi Mealy  MD, Judeth Cornfield     Family history of breast cancer 03/05/2022   GASTROESOPHAGEAL REFLUX, NO ESOPHAGITIS 10/08/2006   Qualifier: Diagnosis of  By: De Burrs, unspecified 10/21/2007   Qualifier: Diagnosis of  By: Gavin Potters MD, [name]     History of DVT (deep vein thrombosis) 05/05/2013   on Eliquis   HYPERLIPIDEMIA 08/08/2008   Qualifier: Diagnosis of  By: Sandi Mealy  MD, Stephanie     HYPERTENSION, BENIGN ESSENTIAL 11/27/2006   Qualifier: Diagnosis of  ByGavin Potters MD, [name]     Mild coronary artery disease 10/2019   Coronary CT Angiogram: Coronary calcium score 46.Normal coronary origin.  Left dominance with tortuous coronary arteries.  Nondominant RCA - prox < 25% followed by ectasia (dilated to 5.5 mm) mixed plaque in distal vessel.  LM- normal . LAD: minimal mixed plaque in ostial & proximal (<25%) with brief IM bridging in mLAD. Mild mid-distal (25-49%), RI - prox <25%, Large LCx- minimal (<25%) mid-dista   Mild coronary artery disease    Personal history of colonic polyps 10/24/2009   Qualifier: Diagnosis of  By: Arlyce Dice MD, Barbette Hair    [place] 08/08/2009   Qualifier: Diagnosis of  By: [place], [name]     Pituitary adenoma  [facility])    Polymyalgia rheumatica (HCC) 10/02/2009   Qualifier: Diagnosis of  By: Hulen Luster MD, Fleet Contras     Pulmonary embolism [facility]) - On long Term anticoagulation (Z79.01) 04/28/2011   Converted from warfarin to Eliquis; on long-term anticoagulation.   Thyroid disease    Trigger ring finger of left hand 11/02/2014    SURGICAL HISTORY: Past Surgical History:  Procedure Laterality Date   ABDOMINAL HYSTERECTOMY     BREAST LUMPECTOMY WITH RADIOACTIVE SEED LOCALIZATION Right 03/25/2022   Procedure: RIGHT BREAST SEED LUMPECTOMY;  Surgeon: Harriette Bouillon, MD;  Location: [facility];  Service: General;  Laterality: Right;   CARDIAC CATHETERIZATION  04/29/2011   (Dr. Riley Kill - [place]): LM - normal. LAD with 1 major Diag - normal, bifurcating Ramus - normal, Large (6-7 mm) Dominant LCx with 1 OM, 2 LPL branches & LPDA - non significant disease; small non-dominant RCA.     CARDIAC EVENT MONITOR  09/2019   Mostly sinus rhythm, average rate 60 bpm.  Range 45 to 190 bpm.  No critical events indicating any arrhythmias or pauses.  For patient responses noted PVCs.  Less than 1% PVCs noted.    CARPAL  TUNNEL RELEASE Right 1996   COLONOSCOPY     KNEE ARTHROSCOPY  Bilateral   masctomy     TRANSTHORACIC ECHOCARDIOGRAM  09/2019   Normal EF - 60 to 65%.  Mild LVH.  GR 1 DD.  Normal RV.  Trivial MR.  Normal IVC.  Normal PA pressures.   TUBAL LIGATION     UPPER GASTROINTESTINAL ENDOSCOPY      SOCIAL HISTORY: Social History   Socioeconomic History   Marital status: Widowed    Spouse name: Not on file   Number of children: 3   Years of education: 10   Highest education level: 10th grade  Occupational History   Occupation: Retired- Multimedia programmer: [employer]  Tobacco Use   Smoking status: Never   Smokeless tobacco: Never  Building services engineer Use: Never used  Substance and Sexual Activity   Alcohol use: No    Alcohol/week: 0.0 standard drinks of alcohol   Drug use: No   Sexual  activity: Not Currently  Other Topics Concern   Not on file  Social History Narrative   Patient lives alone in [place], she is a widow.   Patient has 3 children. Two daughters live locally, one son in [place].    Patient is very active in her church and volunteer work to stay busy.    Patient enjoys cooking, spending time with family, and crossword puzzles.       Social Determinants of Health   Financial Resource Strain: Low Risk  (03/05/2022)   Overall Financial Resource Strain (CARDIA)    Difficulty of Paying Living Expenses: Not very hard  Food Insecurity: No Food Insecurity (10/21/2022)   Hunger Vital Sign    Worried About Running Out of Food in the Last Year: Never true    Ran Out of Food in the Last Year: Never true  Transportation Needs: No Transportation Needs (03/05/2022)   PRAPARE - Administrator, Civil Service (Medical): No    Lack of Transportation (Non-Medical): No  Physical Activity: Sufficiently Active (03/03/2019)   Exercise Vital Sign    Days of Exercise per Week: 3 days    Minutes of Exercise per Session: 60 min  Stress: No Stress Concern Present (03/05/2022)   [employer] of Occupational Health - Occupational Stress Questionnaire    Feeling of Stress : Not at all  Social Connections: Moderately Integrated (01/05/2020)   Social Connection and Isolation Panel [NHANES]    Frequency of Communication with Friends and Family: More than three times a week    Frequency of Social Gatherings with Friends and Family: More than three times a week    Attends Religious Services: More than 4 times per year    Active Member of [employer] or Organizations: Yes    Attends Banker Meetings: More than 4 times per year    Marital Status: Widowed  Intimate Partner Violence: Not At Risk (03/03/2019)   Humiliation, Afraid, Rape, and Kick questionnaire    Fear of Current or Ex-Partner: No    Emotionally Abused: No    Physically Abused: No    Sexually Abused:  No    FAMILY HISTORY: Family History  Problem Relation Age of Onset   Diabetes Mother    Heart disease Mother    Hypertension Mother    Heart disease Father    Hypertension Father    Diabetes Sister    Heart disease Sister    Leukemia Sister 44   Cirrhosis  Sister        alcohol   Kidney disease Sister    Diabetes Sister    Hypertension Brother    Diabetes Brother    Breast cancer Cousin        dx 105s; maternal female cousin   Colon cancer Neg Hx    Pancreatic cancer Neg Hx    Esophageal cancer Neg Hx     Review of Systems  Constitutional:  Negative for appetite change, chills, fatigue, fever and unexpected weight change.  HENT:   Negative for hearing loss, lump/mass and trouble swallowing.   Eyes:  Negative for eye problems and icterus.  Respiratory:  Negative for chest tightness, cough and shortness of breath.   Cardiovascular:  Negative for chest pain, leg swelling and palpitations.  Gastrointestinal:  Negative for abdominal distention, abdominal pain, constipation, diarrhea, nausea and vomiting.  Endocrine: Negative for hot flashes.  Genitourinary:  Negative for difficulty urinating.   Musculoskeletal:  Positive for arthralgias (As per interval history).  Skin:  Negative for itching and rash.  Neurological:  Negative for dizziness, extremity weakness, headaches and numbness.  Hematological:  Negative for adenopathy. Does not bruise/bleed easily.  Psychiatric/Behavioral:  Negative for depression. The patient is not nervous/anxious.       PHYSICAL EXAMINATION    Vitals:   12/15/22 1154  BP: (!) 167/60  Pulse: 62  Resp: 18  Temp: (!) 97.5 F (36.4 C)  SpO2: 100%    Physical Exam  LABORATORY DATA: None for this visit   ASSESSMENT and THERAPY PLAN:   Ductal carcinoma in situ (DCIS) of right breast Erin Coffey is a 77 year old woman with history of right breast DCIS diagnosed in July 2023 status postlumpectomy, adjuvant radiation, and antiestrogen therapy  with anastrozole which began in November 2023.  Right breast DCIS: She has no clinical signs of breast cancer recurrence.  She is scheduled for her mammogram next month which she plans to undergo.  She will continue anastrozole daily as she is tolerating this well. Bone health: Considering her age her previous rheumatoid arthritis treatments and history of bone density loss on treatment with Fosamax she is at risk for further loss with her treatment on anastrozole.  She is scheduled for bone density testing on January 26, 2023.  Discussed bone building activities. Health maintenance: We reviewed healthy diet and activity as she is able considering her comorbidities and rheumatoid arthritis.  She will continue to follow-up with her primary care provider regularly.  Erin Coffey will return in 6 months for follow-up.  She knows to call for any questions or concerns that may arise between now and her next appointment.  All questions were answered. The patient knows to call the clinic with any problems, questions or concerns. We can certainly see the patient much sooner if necessary.  Total encounter time:20 minutes*in face-to-face visit time, chart review, lab review, care coordination, order entry, and documentation of the encounter time.  Lillard Anes, NP 12/15/22 1:26 PM Medical Oncology and Hematology [facility] [address] [place], [place] [address] Tel. [phone]    Fax. [phone]  *Total Encounter Time as defined by the Centers for Medicare and Medicaid Services includes, in addition to the face-to-face time of a patient visit (documented in the note above) non-face-to-face time: obtaining and reviewing outside history, ordering and reviewing medications, tests or procedures, care coordination (communications with other health care professionals or caregivers) and documentation in the medical record.

## 2022-12-16 ENCOUNTER — Other Ambulatory Visit (HOSPITAL_COMMUNITY): Payer: Self-pay

## 2022-12-16 NOTE — Telephone Encounter (Signed)
Mailed [place] application to patients home.

## 2022-12-16 NOTE — Telephone Encounter (Signed)
Attempted to call patient to inform.   No answer.

## 2022-12-18 NOTE — Telephone Encounter (Signed)
Pt called to check status.  Informed of application that should be coming in the mail.   Jone Baseman, CMA

## 2022-12-26 ENCOUNTER — Other Ambulatory Visit: Payer: Self-pay | Admitting: Family Medicine

## 2022-12-30 NOTE — Telephone Encounter (Signed)
Submitted application for FARXIGA to [employer]ME for patient assistance.   Phone: [phone]  

## 2022-12-31 NOTE — Telephone Encounter (Signed)
Received notification from [place]&ME regarding approval for [facility]. Patient assistance approved from 12/31/22 to 08/11/23.  MEDICATION WILL SHIP TO PT'S HOME  Phone: [phone]

## 2023-01-06 ENCOUNTER — Other Ambulatory Visit: Payer: Self-pay

## 2023-01-06 DIAGNOSIS — E119 Type 2 diabetes mellitus without complications: Secondary | ICD-10-CM

## 2023-01-07 MED ORDER — LOSARTAN POTASSIUM 100 MG PO TABS
100.0000 mg | ORAL_TABLET | Freq: Every day | ORAL | 1 refills | Status: DC
Start: 2023-01-07 — End: 2023-06-22

## 2023-01-07 MED ORDER — ALENDRONATE SODIUM 70 MG PO TABS: ORAL_TABLET | ORAL | 0 refills | Status: DC

## 2023-01-08 ENCOUNTER — Encounter: Payer: Self-pay | Admitting: Family Medicine

## 2023-01-08 ENCOUNTER — Ambulatory Visit (INDEPENDENT_AMBULATORY_CARE_PROVIDER_SITE_OTHER): Payer: Medicare Other | Admitting: Family Medicine

## 2023-01-08 ENCOUNTER — Other Ambulatory Visit (HOSPITAL_COMMUNITY): Payer: Self-pay

## 2023-01-08 VITALS — BP 120/62 | HR 71 | Wt 282.0 lb

## 2023-01-08 DIAGNOSIS — I2699 Other pulmonary embolism without acute cor pulmonale: Secondary | ICD-10-CM | POA: Diagnosis not present

## 2023-01-08 DIAGNOSIS — M8000XD Age-related osteoporosis with current pathological fracture, unspecified site, subsequent encounter for fracture with routine healing: Secondary | ICD-10-CM | POA: Diagnosis not present

## 2023-01-08 DIAGNOSIS — E119 Type 2 diabetes mellitus without complications: Secondary | ICD-10-CM | POA: Diagnosis not present

## 2023-01-08 LAB — POCT GLYCOSYLATED HEMOGLOBIN (HGB A1C): HbA1c, POC (controlled diabetic range): 5.8 % (ref 0.0–7.0)

## 2023-01-08 MED ORDER — ALENDRONATE SODIUM 70 MG PO TABS: ORAL_TABLET | ORAL | 0 refills | Status: DC

## 2023-01-08 NOTE — Assessment & Plan Note (Addendum)
Well controlled with A1c of 5.8 not any diabetic medications currently. Is starting Marcelline Deist for renal benefits.  - Continue current management - Could consider A1c every year given the persistent control - Getting eye exam scheduled

## 2023-01-08 NOTE — Progress Notes (Signed)
    SUBJECTIVE:   CHIEF COMPLAINT / HPI:   T2DM - Checking BG at home: No - Medications: none, starting [place] for kidney health - microalbumin:    Lab Results  Component Value Date   LABMICR 248.0 08/29/2022   LABMICR 116.6 03/03/2019   - denies symptoms of hypoglycemia, polyuria, polydipsia, numbness extremities, foot ulcers/trauma  Medication refills - Needs 3 month supply of Alendronate - Getting Farxiga through the manufacturer due to cost - Eliquis is costing patient around $350 for a 3 month supply   PERTINENT  PMH / PSH: Reviewed  OBJECTIVE:   BP 120/62   Pulse 71   Wt 282 lb (127.9 kg)   SpO2 99%   BMI 42.88 kg/m   General: NAD, well-appearing, well-nourished Respiratory: No respiratory distress, breathing comfortably, able to speak in full sentences Skin: warm and dry, no rashes noted on exposed skin Psych: Appropriate affect and mood  ASSESSMENT/PLAN:   Type 2 diabetes mellitus with hemoglobin A1c goal of less than 7.5% (HCC) Well controlled with A1c of 5.8 not any diabetic medications currently. Is starting Marcelline Deist for renal benefits.  - Continue current management - Could consider A1c every year given the persistent control - Getting eye exam scheduled  Age-related osteoporosis with current pathological fracture Fosfamax refill for 3 month supply provided  Pulmonary emboli (HCC) Currently on Eliquis, though medication is very expensive for patient. Failed warfarin therapy due to PE during treatment. Will touch base with pharmacy team to see if there is any option for financial assistance.      Evelena Leyden, DO [facility]

## 2023-01-08 NOTE — Patient Instructions (Addendum)
Your A1c today was 5.8, which is amazing! You're doing great.  I am going to talk with our pharmacy team and see if there is any help we can offer regarding the Eliquis cost.   I have send in your Alendronate for a 3 month supply.

## 2023-01-08 NOTE — Assessment & Plan Note (Signed)
Fosfamax refill for 3 month supply provided

## 2023-01-08 NOTE — Assessment & Plan Note (Signed)
Currently on Eliquis, though medication is very expensive for patient. Failed warfarin therapy due to PE during treatment. Will touch base with pharmacy team to see if there is any option for financial assistance.

## 2023-01-16 ENCOUNTER — Telehealth: Payer: Self-pay

## 2023-01-16 NOTE — Telephone Encounter (Signed)
Mailing [employer] PAP application to patients home for Eliquis assistance. Pt will need to apply for Low Income Subsidy with [employer] as well.

## 2023-01-16 NOTE — Telephone Encounter (Signed)
-----   Message from Evelena Leyden, DO sent at 01/08/2023  9:16 AM EDT ----- Dianna Limbo,  This patient is currently paying at least $350 for a 3 month supply of Eliquis, is there any financial assistance that we could help her with?

## 2023-01-19 ENCOUNTER — Other Ambulatory Visit: Payer: Self-pay

## 2023-01-26 DIAGNOSIS — Z853 Personal history of malignant neoplasm of breast: Secondary | ICD-10-CM | POA: Diagnosis not present

## 2023-01-26 DIAGNOSIS — M859 Disorder of bone density and structure, unspecified: Secondary | ICD-10-CM | POA: Diagnosis not present

## 2023-01-26 DIAGNOSIS — Z13820 Encounter for screening for osteoporosis: Secondary | ICD-10-CM | POA: Diagnosis not present

## 2023-01-27 ENCOUNTER — Encounter: Payer: Self-pay | Admitting: Hematology and Oncology

## 2023-02-03 ENCOUNTER — Encounter: Payer: Self-pay | Admitting: Hematology and Oncology

## 2023-02-09 ENCOUNTER — Other Ambulatory Visit: Payer: Self-pay | Admitting: Student

## 2023-02-09 DIAGNOSIS — G8929 Other chronic pain: Secondary | ICD-10-CM

## 2023-02-23 ENCOUNTER — Other Ambulatory Visit: Payer: Self-pay

## 2023-02-23 MED ORDER — METOPROLOL TARTRATE 25 MG PO TABS: 25.0000 mg | ORAL_TABLET | Freq: Two times a day (BID) | ORAL | 2 refills | Status: DC

## 2023-03-05 ENCOUNTER — Other Ambulatory Visit: Payer: Self-pay | Admitting: Student

## 2023-03-05 DIAGNOSIS — E1140 Type 2 diabetes mellitus with diabetic neuropathy, unspecified: Secondary | ICD-10-CM

## 2023-03-13 NOTE — Telephone Encounter (Signed)
Sent pt mychart message to f/u on application being rec'd.

## 2023-03-17 NOTE — Progress Notes (Deleted)
Cardiology Clinic Note   Patient Name: Erin Coffey Date of Encounter: 03/17/2023  Primary Care Provider:  Alfredo Martinez, MD Primary Cardiologist:  None  Patient Profile    Erin Coffey 77 year old female presents to the clinic today for a follow-up evaluation of her coronary artery disease and hypertension.  Past Medical History    Past Medical History:  Diagnosis Date   Allergy    Anemia, iron deficiency    ANXIETY, SITUATIONAL 04/06/2009   Qualifier: Diagnosis of  By: Sandi Mealy  MD, Stephanie     Arthritis    Back pain, thoracic 12/20/2012   Carpal tunnel syndrome 12/07/2014   Cholelithiasis 06/25/2012   Chronic kidney disease    DM type 2 goal A1C below 7.5 06/05/2008   Qualifier: Diagnosis of  By: Sandi Mealy  MD, Judeth Cornfield     Family history of breast cancer 03/05/2022   GASTROESOPHAGEAL REFLUX, NO ESOPHAGITIS 10/08/2006   Qualifier: Diagnosis of  By: De Burrs, unspecified 10/21/2007   Qualifier: Diagnosis of  By: Gavin Potters MD, [name]     History of DVT (deep vein thrombosis) 05/05/2013   on Eliquis   HYPERLIPIDEMIA 08/08/2008   Qualifier: Diagnosis of  By: Sandi Mealy  MD, Stephanie     HYPERTENSION, BENIGN ESSENTIAL 11/27/2006   Qualifier: Diagnosis of  ByGavin Potters MD, [name]     Mild coronary artery disease 10/2019   Coronary CT Angiogram: Coronary calcium score 46.Normal coronary origin.  Left dominance with tortuous coronary arteries.  Nondominant RCA - prox < 25% followed by ectasia (dilated to 5.5 mm) mixed plaque in distal vessel.  LM- normal . LAD: minimal mixed plaque in ostial & proximal (<25%) with brief IM bridging in mLAD. Mild mid-distal (25-49%), RI - prox <25%, Large LCx- minimal (<25%) mid-dista   Mild coronary artery disease    Personal history of colonic polyps 10/24/2009   Qualifier: Diagnosis of  By: Arlyce Dice MD, Barbette Hair    [place] 08/08/2009   Qualifier: Diagnosis of  By: [place], [name]     Pituitary adenoma [facility])    Polymyalgia rheumatica  (HCC) 10/02/2009   Qualifier: Diagnosis of  By: Hulen Luster MD, Fleet Contras     Pulmonary embolism [facility]) - On long Term anticoagulation (Z79.01) 04/28/2011   Converted from warfarin to Eliquis; on long-term anticoagulation.   Thyroid disease    Trigger ring finger of left hand 11/02/2014   Past Surgical History:  Procedure Laterality Date   ABDOMINAL HYSTERECTOMY     BREAST LUMPECTOMY WITH RADIOACTIVE SEED LOCALIZATION Right 03/25/2022   Procedure: RIGHT BREAST SEED LUMPECTOMY;  Surgeon: Harriette Bouillon, MD;  Location: [facility];  Service: General;  Laterality: Right;   CARDIAC CATHETERIZATION  04/29/2011   (Dr. Riley Kill - [place]): LM - normal. LAD with 1 major Diag - normal, bifurcating Ramus - normal, Large (6-7 mm) Dominant LCx with 1 OM, 2 LPL branches & LPDA - non significant disease; small non-dominant RCA.     CARDIAC EVENT MONITOR  09/2019   Mostly sinus rhythm, average rate 60 bpm.  Range 45 to 190 bpm.  No critical events indicating any arrhythmias or pauses.  For patient responses noted PVCs.  Less than 1% PVCs noted.    CARPAL TUNNEL RELEASE Right 1996   COLONOSCOPY     KNEE ARTHROSCOPY  Bilateral   masctomy     TRANSTHORACIC ECHOCARDIOGRAM  09/2019   Normal EF - 60 to 65%.  Mild LVH.  GR 1 DD.  Normal RV.  Trivial MR.  Normal IVC.  Normal PA pressures.   TUBAL LIGATION     UPPER GASTROINTESTINAL ENDOSCOPY      Allergies  Allergies  Allergen Reactions   Lisinopril Cough    History of Present Illness    Erin Coffey has a PMH of hypertension, pulmonary emboli, mild coronary artery disease, GERD, diverticulosis, type 2 diabetes, hyperlipidemia, vertigo, CKD stage III, anemia, abdominal pain, obesity, and fatigue.  Coronary CTA 3/21 showed nonocclusive coronary disease.  She was seen in follow-up by Dr. Herbie Baltimore on 12/13/2020.  During that time she continued to do well.  Her main complaint was related to her orthopedic issues and occasional falls.  She  reported a fall where she had landed on her left side.  She reported a mechanical fall and disturbance in her balance.  She was trying to increase her physical activity and was eating better.  She had lost around 25 pounds however, she had gained most of it back.  Her palpitations were well-controlled.  She reported that they were not bothersome.  She did note intermittent episodes of left-sided and right-sided chest discomfort.  Her chest discomfort was not related to exertion.  She presents to the clinic today for follow-up evaluation and states***.  *** denies chest pain, shortness of breath, lower extremity edema, fatigue, palpitations, melena, hematuria, hemoptysis, diaphoresis, weakness, presyncope, syncope, orthopnea, and PND.  Coronary artery disease-denies recent episodes of chest pain.  Reports occasional episodes of intermittent left and right sided chest discomfort that are nonexertional.  Coronary CTA 3/21 showed nonobstructive CAD. Continue heart healthy low-sodium diet Continue current medical therapy Increase physical activity as tolerated Order CBC, BMP  Hyperlipidemia-LDL***.  Goal LDL less than 100. High-fiber diet Increase physical activity as tolerated Continue atorvastatin, omega-3 fatty acids Repeat fasting lipids and LFTs  Essential hypertension-BP today***. Maintain blood pressure log Continue amlodipine, metoprolol  CKD stage IIIa-creatinine***.  Avoid nephrotoxic agents Follows with PCP  History of pulmonary embolus-denies recent episodes of shortness of breath.  Reports compliance with apixaban.  Denies bleeding issues. Continue apixaban therapy  Obesity-weight today***. Increase physical activity as tolerated Calorie reduced diet Follows with PCP   Disposition: Follow-up with Dr. Herbie Baltimore or me in 12 months. Home Medications    Prior to Admission medications   Medication Sig Start Date End Date Taking? Authorizing Provider  acetaminophen (TYLENOL)  650 MG CR tablet Take 1,300 mg by mouth every 8 (eight) hours as needed for pain.    [provider]  alendronate (FOSAMAX) 70 MG tablet TAKE 1 TABLET BY MOUTH WEEKLY  WITH 8 OZ OF PLAIN WATER 30  MINUTES BEFORE FIRST FOOD, DRINK OR MEDS. STAY UPRIGHT FOR 30  MINS 01/08/23   Lilland, Alana, DO  amLODipine (NORVASC) 5 MG tablet TAKE 1 TABLET BY MOUTH AT  BEDTIME 10/30/22   Alfredo Martinez, MD  anastrozole (ARIMIDEX) 1 MG tablet Take 1 tablet (1 mg total) by mouth daily. 06/16/22   Rachel Moulds, MD  apixaban (ELIQUIS) 5 MG TABS tablet Take 1 tablet (5 mg total) by mouth 2 (two) times daily. 08/23/22   Alfredo Martinez, MD  atorvastatin (LIPITOR) 40 MG tablet TAKE 1 TABLET BY MOUTH  DAILY 02/24/22   Alfredo Martinez, MD  baclofen (LIORESAL) 10 MG tablet Take 1 tablet (10 mg total) by mouth daily as needed for muscle spasms. 05/12/22   Alfredo Martinez, MD  Blood Glucose Monitoring Suppl (ONETOUCH VERIO) w/Device KIT Use as instructed to test sugars once daily.  Dx Code: E11.9 02/10/19   Westley Chandler, MD  CHOLECALCIFEROL PO Take 2,200 Units by mouth daily.    [provider]  dapagliflozin propanediol (FARXIGA) 10 MG TABS tablet Take 1 tablet by mouth once daily Patient not taking: Reported on 12/15/2022 12/05/22   Alfredo Martinez, MD  Ferrous Sulfate (IRON PO) Take 25 mg by mouth. Gentle Iron from [employer] store    [provider]  fish oil-omega-3 fatty acids 1000 MG capsule Take 1 g by mouth daily.    [provider]  gabapentin (NEURONTIN) 100 MG capsule TAKE 1 CAPSULE BY MOUTH TWICE  DAILY 03/05/23   Alfredo Martinez, MD  glucose blood (ONETOUCH VERIO) test strip Use as instructed to test sugars once daily.  Dx Code: E11.9 03/21/22   Alfredo Martinez, MD  ketoconazole (NIZORAL) 2 % cream Apply to both feet once daily for 4 weeks. 01/17/22   Katha Cabal, DO  Lancet Devices (ONE TOUCH DELICA LANCING DEV) MISC Use as instructed to test sugars once daily.  Dx Code: E11.9 10/29/20    Katha Cabal, DO  losartan (COZAAR) 100 MG tablet Take 1 tablet (100 mg total) by mouth daily. 01/07/23 07/06/23  Alfredo Martinez, MD  metoprolol tartrate (LOPRESSOR) 25 MG tablet Take 1 tablet (25 mg total) by mouth 2 (two) times daily. 02/23/23   Alfredo Martinez, MD  omeprazole (PRILOSEC) 20 MG capsule Take 1 capsule (20 mg total) by mouth daily. 05/03/21   Palumbo, April, MD  ondansetron (ZOFRAN-ODT) 4 MG disintegrating tablet Take 1 tablet (4 mg total) by mouth every 8 (eight) hours as needed for nausea or vomiting. 09/09/22   Mardella Layman, MD  OneTouch Delica Lancets 33G MISC Use as instructed to test sugars once daily.  Dx Code: E11.9 02/10/19   Westley Chandler, MD  polyethylene glycol powder (GLYCOLAX/MIRALAX) powder Take 17 g by mouth 2 (two) times daily as needed. Patient taking differently: Take 17 g by mouth daily as needed (constipation). 08/17/18   Caro Laroche, DO  traMADol (ULTRAM) 50 MG tablet TAKE 1 TABLET BY MOUTH EVERY 6 HOURS AS NEEDED 02/11/23   Alfredo Martinez, MD  vitamin C (ASCORBIC ACID) 500 MG tablet Take 500 mg by mouth daily.    [provider]    Family History    Family History  Problem Relation Age of Onset   Diabetes Mother    Heart disease Mother    Hypertension Mother    Heart disease Father    Hypertension Father    Diabetes Sister    Heart disease Sister    Leukemia Sister 43   Cirrhosis Sister        alcohol   Kidney disease Sister    Diabetes Sister    Hypertension Brother    Diabetes Brother    Breast cancer Cousin        dx 44s; maternal female cousin   Colon cancer Neg Hx    Pancreatic cancer Neg Hx    Esophageal cancer Neg Hx    She indicated that her mother is deceased. She indicated that her father is deceased. She indicated that only one of her five sisters is alive. She indicated that both of her brothers are deceased. She indicated that her cousin is alive. She indicated that the status of her neg hx is unknown.  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   Patient has 3 children. Two daughters live locally, one son in [place].    Patient is very active in her church and volunteer work to stay busy.    Patient enjoys cooking, spending time with family, and crossword puzzles.       Social Determinants of Health   Financial Resource Strain: Low Risk  (03/05/2022)   Overall Financial Resource Strain (CARDIA)    Difficulty of Paying Living Expenses: Not very hard  Food Insecurity: No Food Insecurity (10/21/2022)   Hunger Vital Sign    Worried About Running Out of Food in the Last Year: Never true    Ran Out of Food in the Last Year: Never true  Transportation Needs: No Transportation Needs (03/05/2022)   PRAPARE - Administrator, Civil Service (Medical): No    Lack of Transportation (Non-Medical): No  Physical Activity: Sufficiently Active (03/03/2019)   Exercise Vital Sign    Days of Exercise per Week: 3 days    Minutes of Exercise per Session: 60 min  Stress: No Stress Concern Present (03/05/2022)   [employer] of Occupational Health - Occupational Stress Questionnaire    Feeling of Stress : Not at all  Social Connections: Moderately Integrated (01/05/2020)   Social Connection and Isolation Panel [NHANES]    Frequency of Communication with Friends and Family: More  than three times a week    Frequency of Social Gatherings with Friends and Family: More than three times a week    Attends Religious Services: More than 4 times per year    Active Member of [employer] or Organizations: Yes    Attends Banker Meetings: More than 4 times per year    Marital Status: Widowed  Intimate Partner Violence: Not At Risk (03/03/2019)   Humiliation, Afraid, Rape, and Kick questionnaire    Fear of Current or Ex-Partner: No    Emotionally Abused: No    Physically Abused: No    Sexually Abused: No     Review of Systems    General:  No chills, fever, night sweats or weight changes.  Cardiovascular:  No chest pain, dyspnea on exertion, edema, orthopnea, palpitations, paroxysmal nocturnal dyspnea. Dermatological: No rash, lesions/masses Respiratory: No cough, dyspnea Urologic: No hematuria, dysuria Abdominal:   No nausea, vomiting, diarrhea, bright red blood per rectum, melena, or hematemesis Neurologic:  No visual changes, wkns, changes in mental status. All other systems reviewed and are otherwise negative except as noted above.  Physical Exam    VS:  There were no vitals taken for this visit. , BMI There is no height or weight on file to calculate BMI. GEN: Well nourished, well developed, in no acute distress. HEENT: normal. Neck: Supple, no JVD, carotid bruits, or masses. Cardiac: RRR, no murmurs, rubs, or gallops. No clubbing, cyanosis, edema.  Radials/DP/PT 2+ and  equal bilaterally.  Respiratory:  Respirations regular and unlabored, clear to auscultation bilaterally. GI: Soft, nontender, nondistended, BS + x 4. MS: no deformity or atrophy. Skin: warm and dry, no rash. Neuro:  Strength and sensation are intact. Psych: Normal affect.  Accessory Clinical Findings    Recent Labs: 10/17/2022: ALT 12; BUN 17; Creatinine, Ser 1.21; Hemoglobin 10.8; Platelets 195; Potassium 4.5; Sodium 135   Recent Lipid Panel    Component Value Date/Time   CHOL 172  09/03/2021 0944   TRIG 55 09/03/2021 0944   HDL 79 09/03/2021 0944   CHOLHDL 2.2 09/03/2021 0944   CHOLHDL 1.9 12/10/2015 0850   VLDL 9 12/10/2015 0850   LDLCALC 82 09/03/2021 0944   LDLDIRECT 98 02/02/2014 1501    No BP recorded.  {Refresh Note OR Click here to enter BP  :1}***    ECG personally reviewed by me today- ***    Echocardiogram 09/15/2019  IMPRESSIONS     1. Left ventricular ejection fraction, by visual estimation, is 60 to  65%. The left ventricle has normal function. There is mildly increased  left ventricular hypertrophy.   2. Left ventricular diastolic parameters are consistent with Grade I  diastolic dysfunction (impaired relaxation).   3. The left ventricle has no regional wall motion abnormalities.   4. Global longitudinal strain is -17.5%.   5. Global right ventricle has normal systolic function.The right  ventricular size is normal. No increase in right ventricular wall  thickness.   6. Trivial mitral valve regurgitation.   7. Tricuspid valve regurgitation is mild   8. Aortic valve regurgitation is trivial.   9. Normal pulmonary artery systolic pressure.  10. The inferior vena cava is normal in size with greater than 50%  respiratory variability, suggesting right atrial pressure of 3 mmHg.   FINDINGS   Left Ventricle: Left ventricular ejection fraction, by visual estimation,  is 60 to 65%. The left ventricle has normal function. The left ventricle  has no regional wall motion abnormalities. The left ventricular internal  cavity size was the left  ventricle is normal in size. There is mildly increased left ventricular  hypertrophy. Left ventricular diastolic parameters are consistent with  Grade I diastolic dysfunction (impaired relaxation). Global longitudinal  strain is -17.5%.   Right Ventricle: The right ventricular size is normal. No increase in  right ventricular wall thickness. Global RV systolic function is has  normal systolic function. The  tricuspid regurgitant velocity is 2.50 m/s,  and with an assumed right atrial pressure   of 3 mmHg, the estimated right ventricular systolic pressure is normal at  28.0 mmHg.   Left Atrium: Left atrial size was normal in size.   Right Atrium: Right atrial size was normal in size   Pericardium: There is no evidence of pericardial effusion.   Mitral Valve: The mitral valve is abnormal. There is mild thickening of  the mitral valve leaflet(s). Trivial mitral valve regurgitation.   Tricuspid Valve: The tricuspid valve is normal in structure. Tricuspid  valve regurgitation is mild.   Aortic Valve: The aortic valve is tricuspid. Aortic valve regurgitation is  trivial.   Pulmonic Valve: The pulmonic valve was normal in structure. Pulmonic valve  regurgitation is not visualized. Pulmonic regurgitation is not visualized.   Aorta: The aortic root is normal in size and structure.   Venous: The inferior vena cava is normal in size with greater than 50%  respiratory variability, suggesting right atrial pressure of 3 mmHg.   IAS/Shunts: No atrial  level shunt detected by color flow Doppler.    Coronary CTA 10/27/2019 FINDINGS: Coronary calcium score: The patient's coronary artery calcium score is 46, which places the patient in the 67th percentile.   Coronary arteries: Normal coronary origins.  Left dominance.   Tortuous coronary arteries.   Right Coronary Artery: Minimal atherosclerotic plaque in the proximal RCA, <25% stenosis, and immediately distal the vessel appears to dilate, and may represent ectasia, vessel diameter is 5.55 mm. Mild mixed atherosclerotic plaque in the small caliber distal RCA, 25-49% stenosis.   Left Main Coronary Artery: Long left main segment with no detectable plaque or stenosis.   Left Anterior Descending Coronary Artery: Minimal mixed atherosclerotic plaque in the ostial LAD and proximal LAD, <25% stenoses. Brief segment of myocardial bridging in the  proximal to mid LAD, and subsequently in the mid LAD. Mild mixed atherosclerotic plaque in the mid-distal LAD, 25-49% stenosis.   Ramus intermedius: Minimal atherosclerotic plaque in the proximal ramus branch, <25% stenosis.   Left Circumflex Artery: Large caliber vessel, minimal mixed atherosclerotic plaque in the mid-distal circumflex, <25% stenosis.   Aorta: Normal size, 33 mm at the mid ascending aorta (level of the PA bifurcation) measured double oblique. No calcifications. No dissection.   Aortic Valve: No calcifications.   Other findings:   Normal pulmonary vein drainage into the left atrium.   Normal left atrial appendage without a thrombus.   Mild-moderate dilation of main pulmonary artery, 34 mm   Trivial pericardial effusion.   IMPRESSION: 1. Mild CAD, CADRADS = 2.  Tortuous coronary arteries.   2. Coronary artery calcium score is 46, which places the patient in the 67th percentile.   3. Normal coronary origin with left dominance.   4. Trivial pericardial effusion   5. Mild-moderate dilation of main pulmonary artery, 34 mm.     Electronically Signed   By: Weston Brass   On: 10/30/2019 23:01       Assessment & Plan   1.  Erin Coffey. Erin Emrich NP-C     03/17/2023, 7:10 AM [facility] [address] Office [phone] Fax [phone]    I spent***minutes examining this patient, reviewing medications, and using patient centered shared decision making involving her cardiac care.  Prior to her visit I spent greater than 20 minutes reviewing her past medical history,  medications, and prior cardiac tests.

## 2023-03-19 ENCOUNTER — Ambulatory Visit: Payer: Medicare Other | Admitting: General Practice

## 2023-03-23 ENCOUNTER — Ambulatory Visit (INDEPENDENT_AMBULATORY_CARE_PROVIDER_SITE_OTHER): Payer: Medicare Other | Admitting: Student

## 2023-03-23 ENCOUNTER — Encounter: Payer: Self-pay | Admitting: Student

## 2023-03-23 ENCOUNTER — Ambulatory Visit: Payer: Medicare Other | Admitting: Podiatry

## 2023-03-23 VITALS — BP 141/59 | HR 63 | Ht 68.0 in | Wt 277.8 lb

## 2023-03-23 DIAGNOSIS — M069 Rheumatoid arthritis, unspecified: Secondary | ICD-10-CM | POA: Diagnosis not present

## 2023-03-23 DIAGNOSIS — I2699 Other pulmonary embolism without acute cor pulmonale: Secondary | ICD-10-CM

## 2023-03-23 DIAGNOSIS — R202 Paresthesia of skin: Secondary | ICD-10-CM | POA: Insufficient documentation

## 2023-03-23 DIAGNOSIS — M79641 Pain in right hand: Secondary | ICD-10-CM

## 2023-03-23 DIAGNOSIS — M159 Polyosteoarthritis, unspecified: Secondary | ICD-10-CM | POA: Diagnosis not present

## 2023-03-23 DIAGNOSIS — I10 Essential (primary) hypertension: Secondary | ICD-10-CM

## 2023-03-23 MED ORDER — AMLODIPINE BESYLATE 5 MG PO TABS: 5.0000 mg | ORAL_TABLET | Freq: Every day | ORAL | 2 refills | Status: DC

## 2023-03-23 MED ORDER — DICLOFENAC SODIUM 1 % EX GEL
2.0000 g | Freq: Four times a day (QID) | CUTANEOUS | 1 refills | Status: AC | PRN
Start: 2023-03-23 — End: ?

## 2023-03-23 MED ORDER — BACLOFEN 10 MG PO TABS
10.0000 mg | ORAL_TABLET | Freq: Every day | ORAL | 0 refills | Status: DC | PRN
Start: 2023-03-23 — End: 2024-01-29

## 2023-03-23 NOTE — Assessment & Plan Note (Signed)
On Eliquis with assistance program

## 2023-03-23 NOTE — Assessment & Plan Note (Signed)
Given that she had symptoms of tingling after performing Tinel's and Phalen signs, seems she possibly has carpal tunnel despite history of carpal tunnel release.  No evidence of cubital tunnel on physical examination.  Unilateral tingling makes neuropathy as main cause less likely.  Instructed to continue with cock up splint and Voltaren gel for conservative treatment measures.  Return if symptoms persist.

## 2023-03-23 NOTE — Patient Instructions (Addendum)
It was great to see you today! Thank you for choosing [facility] for your primary care.  Today we addressed:  For the right hand: Continue with the Voltaren gel I have ordered for the area  Trying the stretching exercises for both hands in the mornings   For the left hand: Use voltaren gel Use cock up splint at night   Look up "cock up splint" on amaz[employer]   I have referred you to a rheumatologist as well for your RA  Please check blood pressures at home   I will refill your requested medications   If you haven't already, sign up for My Chart to have easy access to your labs results, and communication with your primary care physician. I recommend that you always bring your medications to each appointment as this makes it easy to ensure you are on the correct medications and helps [name] not miss refills when you need them. Call the clinic at [phone] if your symptoms worsen or you have any concerns. Return in about 4 weeks (around 04/20/2023) for Hand pain and tingling . Please arrive 15 minutes before your appointment to ensure smooth check in process.  We appreciate your efforts in making this happen.  Thank you for allowing me to participate in your care, Alfredo Martinez, MD 03/23/2023, 2:42 PM PGY-3, [facility]

## 2023-03-23 NOTE — Assessment & Plan Note (Signed)
Patient has right hand pain without known trauma but does have a history of rheumatoid arthritis not on any specific RA medications to this point.  Osteoarthritis and rheumatoid arthritis likely contributing to right hand pain as well as overuse.  She can continue with Tylenol and tramadol for breakthrough pain as well as her CBD gel and Voltaren gel which I have ordered for her.  Also gave her hand stretching exercises to use each morning.  I have referred her to rheumatology given her RA diagnosis and has not yet seen a rheumatologist.

## 2023-03-23 NOTE — Assessment & Plan Note (Signed)
Continue current regimen as patient as she has a wide pulse pressure. She will check pressure at home and let me know if >140/90 consecutively.

## 2023-03-23 NOTE — Progress Notes (Signed)
SUBJECTIVE:   CHIEF COMPLAINT / HPI:   Right Hand Pain  H/o RA: Present for a couple of months No trauma to the hand Painful due to overuse with putting the hand down to steady herself with standing  RHD No fever or chills  No evident warmth or erythema  Has tried Tramadol and Tylenol as well as CBD gel.   Left Hand Tingling  -Has history of Carpal Tunnel Release  -Worsens when laying on the left side -Present in every finger on the left hand  -Not present on the right -No pain or difficulty with movement   Hypertension: BP: (!) 141/59 today. Home medications include: Amlodipine 5 mg, Metoprolol 25 mg, losartan 100mg . She endorses taking these medications as prescribed.   Most recent creatinine trend:  Lab Results  Component Value Date   CREATININE 1.21 (H) 10/17/2022   CREATININE 1.46 (H) 09/09/2022   CREATININE 1.38 (H) 03/05/2022   Patient has had a BMP in the past 1 year.  PE on [facility] Currently getting Eliquis through assistance program.   Type 2 Diabetes: Home medications include: Farxiga for renal protection. Does endorse compliance. Patient is currently losing weight and   Most recent A1Cs:  Lab Results  Component Value Date   HGBA1C 5.8 01/08/2023   HGBA1C 6.1 08/29/2022   Last Microalbumin, LDL, Creatinine: Lab Results  Component Value Date   LDLCALC 82 09/03/2021   CREATININE 1.21 (H) 10/17/2022    HLD: On Lipitor 40 mg, last lipid panel Total 172, LDL 82, Triglycerides 55   Osteoporosis:  On Fosamax, UTD Dexa ordered?  Patient has not heard back from Oncology about DEXA results.   CKD: Follows with [facility]   Requesting Baclofen and Amlodipine refill   PERTINENT  PMH / PSH:  History of pulmonary emboli, morbid obesity, coronary artery disease, hypertension, hyperlipidemia, chronic kidney disease, type 2 diabetes, history of pituitary mass, osteoporosis, DCIS of the right breast, anemia, GERD, vertigo   Patient Care Team: Alfredo Martinez, MD as PCP - General (Family Medicine) Blima Ledger, OD (Optometry) Coletta Memos, MD as Consulting Physician (Neurosurgery) Maxie Barb, MD as Consulting Physician (Nephrology) Marykay Lex, MD as Consulting Physician (Cardiology) Harriette Bouillon, MD as Consulting Physician (General Surgery) Rachel Moulds, MD as Consulting Physician (Hematology and Oncology) Dorothy Puffer, MD as Consulting Physician (Radiation Oncology) OBJECTIVE:  BP (!) 141/59   Pulse 63   Ht 5\' 8"  (1.727 m)   Wt 277 lb 12.8 oz (126 kg)   SpO2 100%   BMI 42.24 kg/m  Physical Exam  General: Alert and oriented in no apparent distress Heart: Regular rate and rhythm with no murmurs appreciated Lungs: Normal WOB Abdomen: no abdominal pain Skin: Warm and dry Extremities:  Hand: Limited ROM of wrist with flexion/extension bilaterally with TTP over joints diffusely and arthritic changes evident  No swelling or warmth  No erythema  Symptoms elicited with Tinel's and Phalen's on left   ASSESSMENT/PLAN:  Rheumatoid arthritis involving right hand, unspecified whether rheumatoid factor present (HCC) -     Ambulatory referral to Rheumatology  Right hand pain Assessment & Plan: Patient has right hand pain without known trauma but does have a history of rheumatoid arthritis not on any specific RA medications to this point.  Osteoarthritis and rheumatoid arthritis likely contributing to right hand pain as well as overuse.  She can continue with Tylenol and tramadol for breakthrough pain as well as her CBD gel and Voltaren gel which I have ordered  for her.  Also gave her hand stretching exercises to use each morning.  I have referred her to rheumatology given her RA diagnosis and has not yet seen a rheumatologist.  Orders: -     Diclofenac Sodium; Apply 2 g topically 4 (four) times daily as needed (pain).  Dispense: 150 g; Refill: 1  Osteoarthritis of multiple joints, unspecified osteoarthritis type -      Baclofen; Take 1 tablet (10 mg total) by mouth daily as needed for muscle spasms.  Dispense: 30 each; Refill: 0  Hand tingling Assessment & Plan: Given that she had symptoms of tingling after performing Tinel's and Phalen signs, seems she possibly has carpal tunnel despite history of carpal tunnel release.  No evidence of cubital tunnel on physical examination.  Unilateral tingling makes neuropathy as main cause less likely.  Instructed to continue with cock up splint and Voltaren gel for conservative treatment measures.  Return if symptoms persist.   HYPERTENSION, BENIGN ESSENTIAL Assessment & Plan: Continue current regimen as patient as she has a wide pulse pressure. She will check pressure at home and let me know if >140/90 consecutively.   Orders: -     amLODIPine Besylate; Take 1 tablet (5 mg total) by mouth at bedtime.  Dispense: 100 tablet; Refill: 2  Other acute pulmonary embolism without acute cor pulmonale (HCC) Assessment & Plan: On Eliquis with assistance program     Will discuss recent DEXA results with oncology.   Return in about 4 weeks (around 04/20/2023) for Hand pain and tingling . Alfredo Martinez, MD 03/23/2023, 3:28 PM PGY-3, [facility]

## 2023-03-24 DIAGNOSIS — N1832 Chronic kidney disease, stage 3b: Secondary | ICD-10-CM | POA: Diagnosis not present

## 2023-03-24 DIAGNOSIS — R809 Proteinuria, unspecified: Secondary | ICD-10-CM | POA: Diagnosis not present

## 2023-03-24 DIAGNOSIS — E1122 Type 2 diabetes mellitus with diabetic chronic kidney disease: Secondary | ICD-10-CM | POA: Diagnosis not present

## 2023-03-24 DIAGNOSIS — I129 Hypertensive chronic kidney disease with stage 1 through stage 4 chronic kidney disease, or unspecified chronic kidney disease: Secondary | ICD-10-CM | POA: Diagnosis not present

## 2023-03-24 DIAGNOSIS — N2581 Secondary hyperparathyroidism of renal origin: Secondary | ICD-10-CM | POA: Diagnosis not present

## 2023-03-24 DIAGNOSIS — D631 Anemia in chronic kidney disease: Secondary | ICD-10-CM | POA: Diagnosis not present

## 2023-04-05 NOTE — Progress Notes (Unsigned)
Cardiology Clinic Note   Patient Name: Erin Coffey Date of Encounter: 04/07/2023  Primary Care Provider:  Alfredo Martinez, MD Primary Cardiologist:  Bryan Lemma, MD  Patient Profile    Erin Coffey 77 year old female presents to the clinic today for a follow-up evaluation of her coronary artery disease and hypertension.  Past Medical History    Past Medical History:  Diagnosis Date   Allergy    Anemia, iron deficiency    ANXIETY, SITUATIONAL 04/06/2009   Qualifier: Diagnosis of  By: Sandi Mealy  MD, Stephanie     Arthritis    Back pain, thoracic 12/20/2012   Carpal tunnel syndrome 12/07/2014   Cholelithiasis 06/25/2012   Chronic kidney disease    DM type 2 goal A1C below 7.5 06/05/2008   Qualifier: Diagnosis of  By: Sandi Mealy  MD, Judeth Cornfield     Family history of breast cancer 03/05/2022   GASTROESOPHAGEAL REFLUX, NO ESOPHAGITIS 10/08/2006   Qualifier: Diagnosis of  By: De Burrs, unspecified 10/21/2007   Qualifier: Diagnosis of  By: Gavin Potters MD, [name]     History of DVT (deep vein thrombosis) 05/05/2013   on Eliquis   HYPERLIPIDEMIA 08/08/2008   Qualifier: Diagnosis of  By: Sandi Mealy  MD, Stephanie     HYPERTENSION, BENIGN ESSENTIAL 11/27/2006   Qualifier: Diagnosis of  ByGavin Potters MD, [name]     Mild coronary artery disease 10/2019   Coronary CT Angiogram: Coronary calcium score 46.Normal coronary origin.  Left dominance with tortuous coronary arteries.  Nondominant RCA - prox < 25% followed by ectasia (dilated to 5.5 mm) mixed plaque in distal vessel.  LM- normal . LAD: minimal mixed plaque in ostial & proximal (<25%) with brief IM bridging in mLAD. Mild mid-distal (25-49%), RI - prox <25%, Large LCx- minimal (<25%) mid-dista   Mild coronary artery disease    Personal history of colonic polyps 10/24/2009   Qualifier: Diagnosis of  By: Arlyce Dice MD, Barbette Hair    P[place] 08/08/2009   Qualifier: Diagnosis of  By: [place], [name]     Pituitary adenoma [facility])     Polymyalgia rheumatica (HCC) 10/02/2009   Qualifier: Diagnosis of  By: Hulen Luster MD, Fleet Contras     Pulmonary embolism [facility]) - On long Term anticoagulation (Z79.01) 04/28/2011   Converted from warfarin to Eliquis; on long-term anticoagulation.   Thyroid disease    Trigger ring finger of left hand 11/02/2014   Past Surgical History:  Procedure Laterality Date   ABDOMINAL HYSTERECTOMY     BREAST LUMPECTOMY WITH RADIOACTIVE SEED LOCALIZATION Right 03/25/2022   Procedure: RIGHT BREAST SEED LUMPECTOMY;  Surgeon: Harriette Bouillon, MD;  Location: [facility];  Service: General;  Laterality: Right;   CARDIAC CATHETERIZATION  04/29/2011   (Dr. Riley Kill - [place]): LM - normal. LAD with 1 major Diag - normal, bifurcating Ramus - normal, Large (6-7 mm) Dominant LCx with 1 OM, 2 LPL branches & LPDA - non significant disease; small non-dominant RCA.     CARDIAC EVENT MONITOR  09/2019   Mostly sinus rhythm, average rate 60 bpm.  Range 45 to 190 bpm.  No critical events indicating any arrhythmias or pauses.  For patient responses noted PVCs.  Less than 1% PVCs noted.    CARPAL TUNNEL RELEASE Right 1996   COLONOSCOPY     KNEE ARTHROSCOPY  Bilateral   masctomy     TRANSTHORACIC ECHOCARDIOGRAM  09/2019   Normal EF - 60 to 65%.  Mild LVH.  GR  1 DD.  Normal RV.  Trivial MR.  Normal IVC.  Normal PA pressures.   TUBAL LIGATION     UPPER GASTROINTESTINAL ENDOSCOPY      Allergies  Allergies  Allergen Reactions   Lisinopril Cough    History of Present Illness    Erin Coffey has a PMH of hypertension, pulmonary emboli, mild coronary artery disease, GERD, diverticulosis, type 2 diabetes, hyperlipidemia, vertigo, CKD stage III, anemia, abdominal pain, obesity, and fatigue.  Coronary CTA 3/21 showed nonocclusive coronary disease.  She was seen in follow-up by Dr. Herbie Baltimore on 12/13/2020.  During that time she continued to do well.  Her main complaint was related to her orthopedic issues and  occasional falls.  She reported a fall where she had landed on her left side.  She reported a mechanical fall and disturbance in her balance.  She was trying to increase her physical activity and was eating better.  She had lost around 25 pounds however, she had gained most of it back.  Her palpitations were well-controlled.  She reported that they were not bothersome.  She did note intermittent episodes of left-sided and right-sided chest discomfort.  Her chest discomfort was not related to exertion.  She presents to the clinic today for follow-up evaluation and states she has been undergoing treatment for breast cancer. She continues to take chemo and radiation. She has lost about 13 pounds. She remains stable from a cardiac standpoint. Her blood pressure today was 152/78 and on recheck was 128/60. She had recent labs check with her PCP. I will continue her current medications, request her lab work and plan follow up in 12 months.   Today she denies chest pain, shortness of breath, lower extremity edema, fatigue, palpitations, melena, hematuria, hemoptysis, diaphoresis, weakness, presyncope, syncope, orthopnea, and PND.   Home Medications    Prior to Admission medications   Medication Sig Start Date End Date Taking? Authorizing Provider  acetaminophen (TYLENOL) 650 MG CR tablet Take 1,300 mg by mouth every 8 (eight) hours as needed for pain.    [provider]  alendronate (FOSAMAX) 70 MG tablet TAKE 1 TABLET BY MOUTH WEEKLY  WITH 8 OZ OF PLAIN WATER 30  MINUTES BEFORE FIRST FOOD, DRINK OR MEDS. STAY UPRIGHT FOR 30  MINS 01/08/23   Lilland, Alana, DO  amLODipine (NORVASC) 5 MG tablet TAKE 1 TABLET BY MOUTH AT  BEDTIME 10/30/22   Alfredo Martinez, MD  anastrozole (ARIMIDEX) 1 MG tablet Take 1 tablet (1 mg total) by mouth daily. 06/16/22   Rachel Moulds, MD  apixaban (ELIQUIS) 5 MG TABS tablet Take 1 tablet (5 mg total) by mouth 2 (two) times daily. 08/23/22   Alfredo Martinez, MD  atorvastatin  (LIPITOR) 40 MG tablet TAKE 1 TABLET BY MOUTH  DAILY 02/24/22   Alfredo Martinez, MD  baclofen (LIORESAL) 10 MG tablet Take 1 tablet (10 mg total) by mouth daily as needed for muscle spasms. 05/12/22   Alfredo Martinez, MD  Blood Glucose Monitoring Suppl (ONETOUCH VERIO) w/Device KIT Use as instructed to test sugars once daily.  Dx Code: E11.9 02/10/19   Westley Chandler, MD  CHOLECALCIFEROL PO Take 2,200 Units by mouth daily.    [provider]  dapagliflozin propanediol (FARXIGA) 10 MG TABS tablet Take 1 tablet by mouth once daily Patient not taking: Reported on 12/15/2022 12/05/22   Alfredo Martinez, MD  Ferrous Sulfate (IRON PO) Take 25 mg by mouth. Gentle Iron from [employer],  Historical, MD  fish oil-omega-3 fatty acids 1000 MG capsule Take 1 g by mouth daily.    [provider]  gabapentin (NEURONTIN) 100 MG capsule TAKE 1 CAPSULE BY MOUTH TWICE  DAILY 03/05/23   Alfredo Martinez, MD  glucose blood (ONETOUCH VERIO) test strip Use as instructed to test sugars once daily.  Dx Code: E11.9 03/21/22   Alfredo Martinez, MD  ketoconazole (NIZORAL) 2 % cream Apply to both feet once daily for 4 weeks. 01/17/22   Katha Cabal, DO  Lancet Devices (ONE TOUCH DELICA LANCING DEV) MISC Use as instructed to test sugars once daily.  Dx Code: E11.9 10/29/20   Katha Cabal, DO  losartan (COZAAR) 100 MG tablet Take 1 tablet (100 mg total) by mouth daily. 01/07/23 07/06/23  Alfredo Martinez, MD  metoprolol tartrate (LOPRESSOR) 25 MG tablet Take 1 tablet (25 mg total) by mouth 2 (two) times daily. 02/23/23   Alfredo Martinez, MD  omeprazole (PRILOSEC) 20 MG capsule Take 1 capsule (20 mg total) by mouth daily. 05/03/21   Palumbo, April, MD  ondansetron (ZOFRAN-ODT) 4 MG disintegrating tablet Take 1 tablet (4 mg total) by mouth every 8 (eight) hours as needed for nausea or vomiting. 09/09/22   Mardella Layman, MD  OneTouch Delica Lancets 33G MISC Use as instructed to test sugars once daily.  Dx Code: E11.9  02/10/19   Westley Chandler, MD  polyethylene glycol powder (GLYCOLAX/MIRALAX) powder Take 17 g by mouth 2 (two) times daily as needed. Patient taking differently: Take 17 g by mouth daily as needed (constipation). 08/17/18   Caro Laroche, DO  traMADol (ULTRAM) 50 MG tablet TAKE 1 TABLET BY MOUTH EVERY 6 HOURS AS NEEDED 02/11/23   Alfredo Martinez, MD  vitamin C (ASCORBIC ACID) 500 MG tablet Take 500 mg by mouth daily.    [provider]    Family History    Family History  Problem Relation Age of Onset   Diabetes Mother    Heart disease Mother    Hypertension Mother    Heart disease Father    Hypertension Father    Diabetes Sister    Heart disease Sister    Leukemia Sister 53   Cirrhosis Sister        alcohol   Kidney disease Sister    Diabetes Sister    Hypertension Brother    Diabetes Brother    Breast cancer Cousin        dx 70s; maternal female cousin   Colon cancer Neg Hx    Pancreatic cancer Neg Hx    Esophageal cancer Neg Hx    She indicated that her mother is deceased. She indicated that her father is deceased. She indicated that only one of her five sisters is alive. She indicated that both of her brothers are deceased. She indicated that her cousin is alive. She indicated that the status of her neg hx is unknown.  Social History    Social History   Socioeconomic History   Marital status: Widowed    Spouse name: Not on file   Number of children: 3   Years of education: 10   Highest education level: 10th grade  Occupational History   Occupation: Retired- Multimedia programmer: [employer]  Tobacco Use   Smoking status: Never    Passive exposure: Never   Smokeless tobacco: Never  Vaping Use   Vaping status: Never Used  Substance and Sexual Activity   Alcohol use: No  Alcohol/week: 0.0 standard drinks of alcohol   Drug use: No   Sexual activity: Not Currently  Other Topics Concern   Not on file  Social History Narrative   Patient lives alone in  [place], she is a widow.   Patient has 3 children. Two daughters live locally, one son in [place].    Patient is very active in her church and volunteer work to stay busy.    Patient enjoys cooking, spending time with family, and crossword puzzles.       Social Determinants of Health   Financial Resource Strain: Low Risk  (03/05/2022)   Overall Financial Resource Strain (CARDIA)    Difficulty of Paying Living Expenses: Not very hard  Food Insecurity: No Food Insecurity (10/21/2022)   Hunger Vital Sign    Worried About Running Out of Food in the Last Year: Never true    Ran Out of Food in the Last Year: Never true  Transportation Needs: No Transportation Needs (03/05/2022)   PRAPARE - Administrator, Civil Service (Medical): No    Lack of Transportation (Non-Medical): No  Physical Activity: Sufficiently Active (03/03/2019)   Exercise Vital Sign    Days of Exercise per Week: 3 days    Minutes of Exercise per Session: 60 min  Stress: No Stress Concern Present (03/05/2022)   [employer] of Occupational Health - Occupational Stress Questionnaire    Feeling of Stress : Not at all  Social Connections: Moderately Integrated (01/05/2020)   Social Connection and Isolation Panel [NHANES]    Frequency of Communication with Friends and Family: More than three times a week    Frequency of Social Gatherings with Friends and Family: More than three times a week    Attends Religious Services: More than 4 times per year    Active Member of [employer] or Organizations: Yes    Attends Banker Meetings: More than 4 times per year    Marital Status: Widowed  Intimate Partner Violence: Not At Risk (03/03/2019)   Humiliation, Afraid, Rape, and Kick questionnaire    Fear of Current or Ex-Partner: No    Emotionally Abused: No    Physically Abused: No    Sexually Abused: No     Review of Systems    General:  No chills, fever, night sweats or weight changes.  Cardiovascular:  No  chest pain, dyspnea on exertion, edema, orthopnea, palpitations, paroxysmal nocturnal dyspnea. Dermatological: No rash, lesions/masses Respiratory: No cough, dyspnea Urologic: No hematuria, dysuria Abdominal:   No nausea, vomiting, diarrhea, bright red blood per rectum, melena, or hematemesis Neurologic:  No visual changes, wkns, changes in mental status. All other systems reviewed and are otherwise negative except as noted above.  Physical Exam    VS:  BP 128/60   Pulse (!) 52   Ht 5\' 8"  (1.727 m)   Wt 277 lb (125.6 kg)   BMI 42.12 kg/m  , BMI Body mass index is 42.12 kg/m. GEN: Well nourished, well developed, in no acute distress. HEENT: normal. Neck: Supple, no JVD, carotid bruits, or masses. Cardiac: RRR, no murmurs, rubs, or gallops. No clubbing, cyanosis, edema.  Radials/DP/PT 2+ and equal bilaterally.  Respiratory:  Respirations regular and unlabored, clear to auscultation bilaterally. GI: Soft, nontender, nondistended, BS + x 4. MS: no deformity or atrophy. Skin: warm and dry, no rash. Neuro:  Strength and sensation are intact. Psych: Normal affect.  Accessory Clinical Findings    Recent Labs: 10/17/2022: ALT 12; BUN 17; Creatinine,  Ser 1.21; Hemoglobin 10.8; Platelets 195; Potassium 4.5; Sodium 135   Recent Lipid Panel    Component Value Date/Time   CHOL 172 09/03/2021 0944   TRIG 55 09/03/2021 0944   HDL 79 09/03/2021 0944   CHOLHDL 2.2 09/03/2021 0944   CHOLHDL 1.9 12/10/2015 0850   VLDL 9 12/10/2015 0850   LDLCALC 82 09/03/2021 0944   LDLDIRECT 98 02/02/2014 1501         ECG personally reviewed by me today- sinus bradycardia 52 bpm no ectopy    Echocardiogram 09/15/2019  IMPRESSIONS     1. Left ventricular ejection fraction, by visual estimation, is 60 to  65%. The left ventricle has normal function. There is mildly increased  left ventricular hypertrophy.   2. Left ventricular diastolic parameters are consistent with Grade I  diastolic dysfunction  (impaired relaxation).   3. The left ventricle has no regional wall motion abnormalities.   4. Global longitudinal strain is -17.5%.   5. Global right ventricle has normal systolic function.The right  ventricular size is normal. No increase in right ventricular wall  thickness.   6. Trivial mitral valve regurgitation.   7. Tricuspid valve regurgitation is mild   8. Aortic valve regurgitation is trivial.   9. Normal pulmonary artery systolic pressure.  10. The inferior vena cava is normal in size with greater than 50%  respiratory variability, suggesting right atrial pressure of 3 mmHg.   FINDINGS   Left Ventricle: Left ventricular ejection fraction, by visual estimation,  is 60 to 65%. The left ventricle has normal function. The left ventricle  has no regional wall motion abnormalities. The left ventricular internal  cavity size was the left  ventricle is normal in size. There is mildly increased left ventricular  hypertrophy. Left ventricular diastolic parameters are consistent with  Grade I diastolic dysfunction (impaired relaxation). Global longitudinal  strain is -17.5%.   Right Ventricle: The right ventricular size is normal. No increase in  right ventricular wall thickness. Global RV systolic function is has  normal systolic function. The tricuspid regurgitant velocity is 2.50 m/s,  and with an assumed right atrial pressure   of 3 mmHg, the estimated right ventricular systolic pressure is normal at  28.0 mmHg.   Left Atrium: Left atrial size was normal in size.   Right Atrium: Right atrial size was normal in size   Pericardium: There is no evidence of pericardial effusion.   Mitral Valve: The mitral valve is abnormal. There is mild thickening of  the mitral valve leaflet(s). Trivial mitral valve regurgitation.   Tricuspid Valve: The tricuspid valve is normal in structure. Tricuspid  valve regurgitation is mild.   Aortic Valve: The aortic valve is tricuspid. Aortic  valve regurgitation is  trivial.   Pulmonic Valve: The pulmonic valve was normal in structure. Pulmonic valve  regurgitation is not visualized. Pulmonic regurgitation is not visualized.   Aorta: The aortic root is normal in size and structure.   Venous: The inferior vena cava is normal in size with greater than 50%  respiratory variability, suggesting right atrial pressure of 3 mmHg.   IAS/Shunts: No atrial level shunt detected by color flow Doppler.    Coronary CTA 10/27/2019 FINDINGS: Coronary calcium score: The patient's coronary artery calcium score is 46, which places the patient in the 67th percentile.   Coronary arteries: Normal coronary origins.  Left dominance.   Tortuous coronary arteries.   Right Coronary Artery: Minimal atherosclerotic plaque in the proximal RCA, <25% stenosis, and immediately  distal the vessel appears to dilate, and may represent ectasia, vessel diameter is 5.55 mm. Mild mixed atherosclerotic plaque in the small caliber distal RCA, 25-49% stenosis.   Left Main Coronary Artery: Long left main segment with no detectable plaque or stenosis.   Left Anterior Descending Coronary Artery: Minimal mixed atherosclerotic plaque in the ostial LAD and proximal LAD, <25% stenoses. Brief segment of myocardial bridging in the proximal to mid LAD, and subsequently in the mid LAD. Mild mixed atherosclerotic plaque in the mid-distal LAD, 25-49% stenosis.   Ramus intermedius: Minimal atherosclerotic plaque in the proximal ramus branch, <25% stenosis.   Left Circumflex Artery: Large caliber vessel, minimal mixed atherosclerotic plaque in the mid-distal circumflex, <25% stenosis.   Aorta: Normal size, 33 mm at the mid ascending aorta (level of the PA bifurcation) measured double oblique. No calcifications. No dissection.   Aortic Valve: No calcifications.   Other findings:   Normal pulmonary vein drainage into the left atrium.   Normal left atrial  appendage without a thrombus.   Mild-moderate dilation of main pulmonary artery, 34 mm   Trivial pericardial effusion.   IMPRESSION: 1. Mild CAD, CADRADS = 2.  Tortuous coronary arteries.   2. Coronary artery calcium score is 46, which places the patient in the 67th percentile.   3. Normal coronary origin with left dominance.   4. Trivial pericardial effusion   5. Mild-moderate dilation of main pulmonary artery, 34 mm.     Electronically Signed   By: Weston Brass   On: 10/30/2019 23:01       Assessment & Plan   1.  Coronary artery disease-denies recent episodes of chest pain.  Reports occasional episodes of intermittent left and right sided chest discomfort that are nonexertional.  Coronary CTA 3/21 showed nonobstructive CAD. Continue heart healthy low-sodium diet Continue current medical therapy Increase physical activity as tolerated Order CBC, BMP  Hyperlipidemia-LDL 82 on 09/03/21.  Goal LDL less than 100. High-fiber diet Increase physical activity as tolerated Continue atorvastatin, omega-3 fatty acids Request from pcp  Essential hypertension-BP today128/60. Maintain blood pressure log Continue amlodipine, metoprolol  CKD stage IIIa-creatinine 1.210 10/17/22.  Avoid nephrotoxic agents Follows with PCP  History of pulmonary embolus-denies recent episodes of shortness of breath.  Reports compliance with apixaban.  Denies bleeding issues. Continue apixaban therapy  Obesity-weight today 277 lbs. Increase physical activity as tolerated Calorie reduced diet Follows with PCP   Disposition: Follow-up with Dr. Herbie Baltimore or me in 12 months.   Thomasene Ripple. Teagan Ozawa NP-C     04/07/2023, 9:00 AM [facility] [address] Office [phone] Fax [phone]    I spent 14 minutes examining this patient, reviewing medications, and using patient centered shared decision making involving her cardiac care.  Prior to her visit  I spent greater than 20 minutes reviewing her past medical history,  medications, and prior cardiac tests.

## 2023-04-06 DIAGNOSIS — H53143 Visual discomfort, bilateral: Secondary | ICD-10-CM | POA: Diagnosis not present

## 2023-04-06 DIAGNOSIS — H524 Presbyopia: Secondary | ICD-10-CM | POA: Diagnosis not present

## 2023-04-06 DIAGNOSIS — E119 Type 2 diabetes mellitus without complications: Secondary | ICD-10-CM | POA: Diagnosis not present

## 2023-04-06 DIAGNOSIS — H52222 Regular astigmatism, left eye: Secondary | ICD-10-CM | POA: Diagnosis not present

## 2023-04-06 DIAGNOSIS — H2513 Age-related nuclear cataract, bilateral: Secondary | ICD-10-CM | POA: Diagnosis not present

## 2023-04-06 LAB — HM DIABETES EYE EXAM

## 2023-04-07 ENCOUNTER — Encounter: Payer: Self-pay | Admitting: General Practice

## 2023-04-07 ENCOUNTER — Ambulatory Visit: Payer: Medicare Other | Attending: General Practice | Admitting: General Practice

## 2023-04-07 VITALS — BP 128/60 | HR 52 | Ht 68.0 in | Wt 277.0 lb

## 2023-04-07 DIAGNOSIS — I10 Essential (primary) hypertension: Secondary | ICD-10-CM

## 2023-04-07 DIAGNOSIS — E785 Hyperlipidemia, unspecified: Secondary | ICD-10-CM | POA: Diagnosis not present

## 2023-04-07 DIAGNOSIS — Z86711 Personal history of pulmonary embolism: Secondary | ICD-10-CM

## 2023-04-07 DIAGNOSIS — E1169 Type 2 diabetes mellitus with other specified complication: Secondary | ICD-10-CM | POA: Diagnosis not present

## 2023-04-07 DIAGNOSIS — I2510 Atherosclerotic heart disease of native coronary artery without angina pectoris: Secondary | ICD-10-CM

## 2023-04-07 NOTE — Patient Instructions (Addendum)
Medication Instructions:  The current medical regimen is effective;  continue present plan and medications as directed. Please refer to the Current Medication list given to you today.  *If you need a refill on your cardiac medications before your next appointment, please call your pharmacy*   Lab Work: NONE If you have labs (blood work) drawn today and your tests are completely normal, you will receive your results only by:  MyChart Message (if you have MyChart) OR  A paper copy in the mail If you have any lab test that is abnormal or we need to change your treatment, we will call you to review the results.  Other Instructions INCREASE PHYSICAL ACTIVITY-AS TOLERATED PLEASE READ AND FOLLOW ATTACHED  SALTY 6   Follow-Up: At [facility], you and your health needs are our priority.  As part of our continuing mission to provide you with exceptional heart care, we have created designated Provider Care Teams.  These Care Teams include your primary Cardiologist (physician) and Advanced Practice Providers (APPs -  Physician Assistants and Nurse Practitioners) who all work together to provide you with the care you need, when you need it.  Your next appointment:   12 month(s)  Provider:   Bryan Lemma, MD or Edd Fabian, FNP-C

## 2023-04-20 DIAGNOSIS — D0511 Intraductal carcinoma in situ of right breast: Secondary | ICD-10-CM | POA: Diagnosis not present

## 2023-04-27 ENCOUNTER — Encounter: Payer: Self-pay | Admitting: Student

## 2023-04-27 ENCOUNTER — Other Ambulatory Visit: Payer: Self-pay | Admitting: Neurosurgery

## 2023-04-27 ENCOUNTER — Ambulatory Visit (INDEPENDENT_AMBULATORY_CARE_PROVIDER_SITE_OTHER): Payer: Medicare Other | Admitting: Student

## 2023-04-27 VITALS — BP 134/59 | HR 55 | Ht 68.0 in | Wt 280.4 lb

## 2023-04-27 DIAGNOSIS — Z23 Encounter for immunization: Secondary | ICD-10-CM | POA: Diagnosis not present

## 2023-04-27 DIAGNOSIS — G5602 Carpal tunnel syndrome, left upper limb: Secondary | ICD-10-CM

## 2023-04-27 DIAGNOSIS — R200 Anesthesia of skin: Secondary | ICD-10-CM | POA: Diagnosis not present

## 2023-04-27 DIAGNOSIS — R202 Paresthesia of skin: Secondary | ICD-10-CM | POA: Diagnosis not present

## 2023-04-27 DIAGNOSIS — I10 Essential (primary) hypertension: Secondary | ICD-10-CM

## 2023-04-27 DIAGNOSIS — D497 Neoplasm of unspecified behavior of endocrine glands and other parts of nervous system: Secondary | ICD-10-CM

## 2023-04-27 NOTE — Assessment & Plan Note (Signed)
In the left hand. Positive flick sign at night, symptoms most fitting with carpal tunnel syndrome.  Likely also has underlying osteoarthritis contributing to pain. No symptoms consistent with a cervical radiculopathy. Referral sent to hand surgery for reevaluation as she is s/p carpal tunnel release surgery years ago. Continue nighttime wrist splint, emphasized importance of consistency with this.

## 2023-04-27 NOTE — Progress Notes (Signed)
    SUBJECTIVE:   CHIEF COMPLAINT / HPI:   Erin Coffey is a 64 female here to follow-up left hand tingling and numbness, thought to be related to possible carpal tunnel.  The tingling gets worse especially when she is holding something.  She has had carpal tunnel release. She used the wrist splint 1-2 times per week.  She awakens at night with numbness Denies any neck pain, but has "always had trouble with her shoulder." She uses CBD ointment that helps a bit.  Has been trying Voltaren gel with relief.  She has an appointment scheduled dermatology for February 2025 for right hand/wrist pain.  She is not currently on any DMARD therapy. She says she was diagnosed with RA when she was 18, but never had treatment.   Desires influenza vaccination today as well.  PERTINENT  PMH / PSH: Hypertension history of PE, T2DM, osteoarthritis  OBJECTIVE:   BP (!) 134/59   Pulse (!) 55   Ht 5\' 8"  (1.727 m)   Wt 280 lb 6.4 oz (127.2 kg)   SpO2 100%   BMI 42.63 kg/m  General: NAD, well appearing Cardiac: RRR Neuro: A&O Respiratory: normal WOB on RA. No wheezing or crackles on auscultation, good lung sounds throughout Extremities: Moving all 4 extremities equally Left hand/wrist: Inspection yields no obvious deformities, ecchymosis or erythema.  Normal strength, neurovascularly intact.  Positive Phalen's.   ASSESSMENT/PLAN:   Numbness and tingling In the left hand. Positive flick sign at night, symptoms most fitting with carpal tunnel syndrome.  Likely also has underlying osteoarthritis contributing to pain. No symptoms consistent with a cervical radiculopathy. Referral sent to hand surgery for reevaluation as she is s/p carpal tunnel release surgery years ago. Continue nighttime wrist splint, emphasized importance of consistency with this.  HYPERTENSION, BENIGN ESSENTIAL BP initially elevated 160s/50s, but repeat within goal less than 140/90     Annual influenza vaccination  administered today.  Darral Dash, DO [facility]

## 2023-04-27 NOTE — Patient Instructions (Addendum)
It was great seeing you today.  As we discussed, -Your symptoms are still consistent with carpal tunnel.  I have placed a referral to hand surgery.  They will call you to schedule. -In the meantime, please try to wear wrist splint most nights of the week.  You may continue using topical treatments as well.   If you have any questions or concerns, please feel free to call the clinic.   Have a wonderful day,  Dr. Darral Dash [facility] [phone]

## 2023-04-27 NOTE — Assessment & Plan Note (Signed)
BP initially elevated 160s/50s, but repeat within goal less than 140/90

## 2023-05-01 ENCOUNTER — Ambulatory Visit (INDEPENDENT_AMBULATORY_CARE_PROVIDER_SITE_OTHER): Payer: Medicare Other

## 2023-05-01 VITALS — Ht 68.0 in | Wt 280.0 lb

## 2023-05-01 DIAGNOSIS — Z0000 Encounter for general adult medical examination without abnormal findings: Secondary | ICD-10-CM

## 2023-05-01 NOTE — Patient Instructions (Signed)
Erin Coffey , Thank you for taking time to come for your Medicare Wellness Visit. I appreciate your ongoing commitment to your health goals. Please review the following plan we discussed and let me know if I can assist you in the future.   Referrals/Orders/Follow-Ups/Clinician Recommendations: Aim for 30 minutes of exercise or brisk walking, 6-8 glasses of water, and 5 servings of fruits and vegetables each day.  This is a list of the screening recommended for you and due dates:  Health Maintenance  Topic Date Due   Zoster (Shingles) Vaccine (2 of 2) 02/14/2013   DTaP/Tdap/Td vaccine (3 - Td or Tdap) 01/21/2023   COVID-19 Vaccine (5 - 2023-24 season) 04/12/2023   Hemoglobin A1C  07/11/2023   Yearly kidney health urinalysis for diabetes  08/30/2023   Yearly kidney function blood test for diabetes  10/17/2023   Complete foot exam   12/05/2023   Mammogram  01/26/2024   Eye exam for diabetics  04/05/2024   Medicare Annual Wellness Visit  04/30/2024   DEXA scan (bone density measurement)  01/25/2025   Pneumonia Vaccine  Completed   Flu Shot  Completed   Hepatitis C Screening  Completed   HPV Vaccine  Aged Out   Colon Cancer Screening  Discontinued    Advanced directives: (ACP Link)Information on Advanced Care Planning can be found at [facility] Advance Health Care Directives Advance Health Care Directives ([email or web address]   Next Medicare Annual Wellness Visit scheduled for next year: Yes

## 2023-05-01 NOTE — Progress Notes (Signed)
Subjective:   Erin Coffey is a 77 y.o. female who presents for Medicare Annual (Subsequent) preventive examination.  Visit Complete: Virtual  I connected with  Erin Coffey on 05/01/23 by a audio enabled telemedicine application and verified that I am speaking with the correct person using two identifiers.  Patient Location: Home  Provider Location: Home Office  I discussed the limitations of evaluation and management by telemedicine. The patient expressed understanding and agreed to proceed.  Vital Signs: Because this visit was a virtual/telehealth visit, some criteria may be missing or patient reported. Any vitals not documented were not able to be obtained and vitals that have been documented are patient reported.   Cardiac Risk Factors include: advanced age (>71men, >18 women);diabetes mellitus;dyslipidemia;hypertension     Objective:    Today's Vitals   05/01/23 1657  Weight: 280 lb (127 kg)  Height: 5\' 8"  (1.727 m)   Body mass index is 42.57 kg/m.     05/01/2023    5:04 PM 04/27/2023    8:35 AM 03/23/2023    2:09 PM 01/08/2023    8:31 AM 10/17/2022   10:47 AM 09/15/2022    8:49 AM 08/29/2022    8:32 AM  Advanced Directives  Does Patient Have a Medical Advance Directive? No No No No No Yes No  Type of Careers adviser;Living will   Does patient want to make changes to medical advance directive?      No - Patient declined   Copy of Healthcare Power of Attorney in Chart?      No - copy requested   Would patient like information on creating a medical advance directive? Yes (MAU/Ambulatory/Procedural Areas - Information given)   No - Patient declined No - Patient declined  No - Patient declined    Current Medications (verified) Outpatient Encounter Medications as of 05/01/2023  Medication Sig   acetaminophen (TYLENOL) 650 MG CR tablet Take 1,300 mg by mouth every 8 (eight) hours as needed for pain.   alendronate (FOSAMAX) 70  MG tablet TAKE 1 TABLET BY MOUTH WEEKLY  WITH 8 OZ OF PLAIN WATER 30  MINUTES BEFORE FIRST FOOD, DRINK OR MEDS. STAY UPRIGHT FOR 30  MINS   amLODipine (NORVASC) 5 MG tablet Take 1 tablet (5 mg total) by mouth at bedtime.   anastrozole (ARIMIDEX) 1 MG tablet Take 1 tablet (1 mg total) by mouth daily.   apixaban (ELIQUIS) 5 MG TABS tablet Take 1 tablet (5 mg total) by mouth 2 (two) times daily.   atorvastatin (LIPITOR) 40 MG tablet TAKE 1 TABLET BY MOUTH  DAILY   baclofen (LIORESAL) 10 MG tablet Take 1 tablet (10 mg total) by mouth daily as needed for muscle spasms.   Blood Glucose Monitoring Suppl (ONETOUCH VERIO) w/Device KIT Use as instructed to test sugars once daily.  Dx Code: E11.9   CHOLECALCIFEROL PO Take 2,200 Units by mouth daily.   diclofenac Sodium (VOLTAREN) 1 % GEL Apply 2 g topically 4 (four) times daily as needed (pain).   Ferrous Sulfate (IRON PO) Take 25 mg by mouth. Gentle Iron from [employer] store   fish oil-omega-3 fatty acids 1000 MG capsule Take 1 g by mouth daily.   gabapentin (NEURONTIN) 100 MG capsule TAKE 1 CAPSULE BY MOUTH TWICE  DAILY   glucose blood (ONETOUCH VERIO) test strip Use as instructed to test sugars once daily.  Dx Code: E11.9   ketoconazole (NIZORAL) 2 % cream  Apply to both feet once daily for 4 weeks.   Lancet Devices (ONE TOUCH DELICA LANCING DEV) MISC Use as instructed to test sugars once daily.  Dx Code: E11.9   losartan (COZAAR) 100 MG tablet Take 1 tablet (100 mg total) by mouth daily.   metoprolol tartrate (LOPRESSOR) 25 MG tablet Take 1 tablet (25 mg total) by mouth 2 (two) times daily.   omeprazole (PRILOSEC) 20 MG capsule Take 1 capsule (20 mg total) by mouth daily.   ondansetron (ZOFRAN-ODT) 4 MG disintegrating tablet Take 1 tablet (4 mg total) by mouth every 8 (eight) hours as needed for nausea or vomiting.   OneTouch Delica Lancets 33G MISC Use as instructed to test sugars once daily.  Dx Code: E11.9   polyethylene glycol powder  (GLYCOLAX/MIRALAX) powder Take 17 g by mouth 2 (two) times daily as needed. (Patient taking differently: Take 17 g by mouth daily as needed (constipation).)   traMADol (ULTRAM) 50 MG tablet TAKE 1 TABLET BY MOUTH EVERY 6 HOURS AS NEEDED   vitamin C (ASCORBIC ACID) 500 MG tablet Take 500 mg by mouth daily.   dapagliflozin propanediol (FARXIGA) 10 MG TABS tablet Take 1 tablet by mouth once daily (Patient not taking: Reported on 12/15/2022)   No facility-administered encounter medications on file as of 05/01/2023.    Allergies (verified) Lisinopril   History: Past Medical History:  Diagnosis Date   Allergy    Anemia, iron deficiency    ANXIETY, SITUATIONAL 04/06/2009   Qualifier: Diagnosis of  By: Sandi Mealy  MD, Stephanie     Arthritis    Back pain, thoracic 12/20/2012   Carpal tunnel syndrome 12/07/2014   Cholelithiasis 06/25/2012   Chronic kidney disease    DM type 2 goal A1C below 7.5 06/05/2008   Qualifier: Diagnosis of  By: Sandi Mealy  MD, Judeth Cornfield     Family history of breast cancer 03/05/2022   GASTROESOPHAGEAL REFLUX, NO ESOPHAGITIS 10/08/2006   Qualifier: Diagnosis of  By: De Burrs, unspecified 10/21/2007   Qualifier: Diagnosis of  By: Gavin Potters MD, [name]     History of DVT (deep vein thrombosis) 05/05/2013   on Eliquis   HYPERLIPIDEMIA 08/08/2008   Qualifier: Diagnosis of  By: Sandi Mealy  MD, Stephanie     HYPERTENSION, BENIGN ESSENTIAL 11/27/2006   Qualifier: Diagnosis of  ByGavin Potters MD, [name]     Mild coronary artery disease 10/2019   Coronary CT Angiogram: Coronary calcium score 46.Normal coronary origin.  Left dominance with tortuous coronary arteries.  Nondominant RCA - prox < 25% followed by ectasia (dilated to 5.5 mm) mixed plaque in distal vessel.  LM- normal . LAD: minimal mixed plaque in ostial & proximal (<25%) with brief IM bridging in mLAD. Mild mid-distal (25-49%), RI - prox <25%, Large LCx- minimal (<25%) mid-dista   Mild coronary artery disease    Personal history  of colonic polyps 10/24/2009   Qualifier: Diagnosis of  By: Arlyce Dice MD, Barbette Hair    Pica 08/08/2009   Qualifier: Diagnosis of  By: [place], [name]     Pituitary adenoma [facility])    Polymyalgia rheumatica (HCC) 10/02/2009   Qualifier: Diagnosis of  By: Hulen Luster MD, Fleet Contras     Pulmonary embolism [facility]) - On long Term anticoagulation (Z79.01) 04/28/2011   Converted from warfarin to Eliquis; on long-term anticoagulation.   Thyroid disease    Trigger ring finger of left hand 11/02/2014   Past Surgical History:  Procedure Laterality Date   ABDOMINAL HYSTERECTOMY  BREAST LUMPECTOMY WITH RADIOACTIVE SEED LOCALIZATION Right 03/25/2022   Procedure: RIGHT BREAST SEED LUMPECTOMY;  Surgeon: Harriette Bouillon, MD;  Location: [facility];  Service: General;  Laterality: Right;   CARDIAC CATHETERIZATION  04/29/2011   (Dr. Riley Kill - ): LM - normal. LAD with 1 major Diag - normal, bifurcating Ramus - normal, Large (6-7 mm) Dominant LCx with 1 OM, 2 LPL branches & LPDA - non significant disease; small non-dominant RCA.     CARDIAC EVENT MONITOR  09/2019   Mostly sinus rhythm, average rate 60 bpm.  Range 45 to 190 bpm.  No critical events indicating any arrhythmias or pauses.  For patient responses noted PVCs.  Less than 1% PVCs noted.    CARPAL TUNNEL RELEASE Right 1996   COLONOSCOPY     KNEE ARTHROSCOPY  Bilateral   masctomy     TRANSTHORACIC ECHOCARDIOGRAM  09/2019   Normal EF - 60 to 65%.  Mild LVH.  GR 1 DD.  Normal RV.  Trivial MR.  Normal IVC.  Normal PA pressures.   TUBAL LIGATION     UPPER GASTROINTESTINAL ENDOSCOPY     Family History  Problem Relation Age of Onset   Diabetes Mother    Heart disease Mother    Hypertension Mother    Heart disease Father    Hypertension Father    Diabetes Sister    Heart disease Sister    Leukemia Sister 80   Cirrhosis Sister        alcohol   Kidney disease Sister    Diabetes Sister    Hypertension Brother    Diabetes Brother     Breast cancer Cousin        dx 40s; maternal female cousin   Colon cancer Neg Hx    Pancreatic cancer Neg Hx    Esophageal cancer Neg Hx    Social History   Socioeconomic History   Marital status: Widowed    Spouse name: Not on file   Number of children: 3   Years of education: 10   Highest education level: 10th grade  Occupational History   Occupation: Retired- Multimedia programmer: [employer]  Tobacco Use   Smoking status: Never    Passive exposure: Never   Smokeless tobacco: Never  Vaping Use   Vaping status: Never Used  Substance and Sexual Activity   Alcohol use: No    Alcohol/week: 0.0 standard drinks of alcohol   Drug use: No   Sexual activity: Not Currently  Other Topics Concern   Not on file  Social History Narrative   Patient lives alone in [place], she is a widow.   Patient has 3 children. Two daughters live locally, one son in [place].    Patient is very active in her church and volunteer work to stay busy.    Patient enjoys cooking, spending time with family, and crossword puzzles.       Social Determinants of Health   Financial Resource Strain: Low Risk  (05/01/2023)   Overall Financial Resource Strain (CARDIA)    Difficulty of Paying Living Expenses: Not hard at all  Food Insecurity: No Food Insecurity (05/01/2023)   Hunger Vital Sign    Worried About Running Out of Food in the Last Year: Never true    Ran Out of Food in the Last Year: Never true  Transportation Needs: No Transportation Needs (05/01/2023)   PRAPARE - Administrator, Civil Service (Medical): No  Lack of Transportation (Non-Medical): No  Physical Activity: Insufficiently Active (05/01/2023)   Exercise Vital Sign    Days of Exercise per Week: 3 days    Minutes of Exercise per Session: 30 min  Stress: No Stress Concern Present (05/01/2023)   [employer] of Occupational Health - Occupational Stress Questionnaire    Feeling of Stress : Not at all  Social Connections:  Moderately Integrated (05/01/2023)   Social Connection and Isolation Panel [NHANES]    Frequency of Communication with Friends and Family: More than three times a week    Frequency of Social Gatherings with Friends and Family: Three times a week    Attends Religious Services: More than 4 times per year    Active Member of Clubs or Organizations: Yes    Attends Banker Meetings: More than 4 times per year    Marital Status: Widowed    Tobacco Counseling Counseling given: Not Answered   Clinical Intake:  Pre-visit preparation completed: Yes  Pain : No/denies pain     Diabetes: No  How often do you need to have someone help you when you read instructions, pamphlets, or other written materials from your doctor or pharmacy?: 1 - Never  Interpreter Needed?: No  Information entered by :: Kandis Fantasia LPN   Activities of Daily Living    05/01/2023    5:04 PM  In your present state of health, do you have any difficulty performing the following activities:  Hearing? 0  Vision? 0  Difficulty concentrating or making decisions? 0  Walking or climbing stairs? 0  Dressing or bathing? 0  Doing errands, shopping? 0  Preparing Food and eating ? N  Using the Toilet? N  In the past six months, have you accidently leaked urine? N  Do you have problems with loss of bowel control? N  Managing your Medications? N  Managing your Finances? N  Housekeeping or managing your Housekeeping? N    Patient Care Team: Alfredo Martinez, MD as PCP - General (Family Medicine) Marykay Lex, MD as PCP - Cardiology (Cardiology) Blima Ledger, OD (Optometry) Coletta Memos, MD as Consulting Physician (Neurosurgery) Maxie Barb, MD as Consulting Physician (Nephrology) Marykay Lex, MD as Consulting Physician (Cardiology) Harriette Bouillon, MD as Consulting Physician (General Surgery) Rachel Moulds, MD as Consulting Physician (Hematology and Oncology) Dorothy Puffer, MD as  Consulting Physician (Radiation Oncology)  Indicate any recent Medical Services you may have received from other than [facility] providers in the past year (date may be approximate).     Assessment:   This is a routine wellness examination for F[place]ia.  Hearing/Vision screen Hearing Screening - Comments:: Denies hearing difficulties   Vision Screening - Comments:: Wears rx glasses - up to date with routine eye exams with Dr. Blima Ledger     Goals Addressed   None   Depression Screen    05/01/2023    5:02 PM 04/27/2023    8:34 AM 03/23/2023    2:08 PM 01/08/2023    8:36 AM 10/17/2022   10:48 AM 08/29/2022    8:33 AM 08/26/2022   11:50 AM  PHQ 2/9 Scores  PHQ - 2 Score 1 1 1 1 1 2  0  PHQ- 9 Score 5 6 3 3 5 3      Fall Risk    05/01/2023    5:04 PM 04/27/2023    8:34 AM 03/23/2023    2:08 PM 01/08/2023   10:56 AM 10/17/2022   10:48 AM  Fall  Risk   Falls in the past year? 0 0 0 0 0  Number falls in past yr: 0 0 0 0 0  Injury with Fall? 0 0 0 0 0  Risk for fall due to : No Fall Risks   No Fall Risks   Follow up Falls prevention discussed;Education provided;Falls evaluation completed   Falls prevention discussed     MEDICARE RISK AT HOME: Medicare Risk at Home Any stairs in or around the home?: No If so, are there any without handrails?: No Home free of loose throw rugs in walkways, pet beds, electrical cords, etc?: Yes Adequate lighting in your home to reduce risk of falls?: Yes Life alert?: No Use of a cane, walker or w/c?: No Grab bars in the bathroom?: Yes Shower chair or bench in shower?: No Elevated toilet seat or a handicapped toilet?: No  TIMED UP AND GO:  Was the test performed?  No    Cognitive Function:    10/27/2013    4:00 PM  MMSE - Mini Mental State Exam  Orientation to time 5  Orientation to Place 5  Registration 3  Attention/ Calculation 5  Recall 3  Language- name 2 objects 2  Language- repeat 1  Language- follow 3 step command 3  Language- read &  follow direction 1  Write a sentence 1  Copy design 1  Total score 30        05/01/2023    5:04 PM 09/30/2021    9:23 AM 01/05/2020    9:30 AM  6CIT Screen  What Year? 0 points 0 points 0 points  What month? 0 points 0 points 0 points  What time? 0 points 0 points 0 points  Count back from 20 0 points 0 points 0 points  Months in reverse 0 points 0 points 0 points  Repeat phrase 0 points 0 points 0 points  Total Score 0 points 0 points 0 points    Immunizations Immunization History  Administered Date(s) Administered   Fluad Quad(high Dose 65+) 04/19/2020, 05/08/2021, 05/12/2022   Fluad Trivalent(High Dose 65+) 04/27/2023   Influenza Split 05/15/2011, 04/29/2012   Influenza Whole 06/05/2008, 08/08/2009   Influenza,inj,Quad PF,6+ Mos 05/05/2013, 06/08/2014, 04/19/2015, 04/29/2016, 08/21/2016, 04/09/2017, 05/31/2018, 05/05/2019   PFIZER(Purple Top)SARS-COV-2 Vaccination 10/01/2019, 10/25/2019, 08/18/2020   Pfizer Covid-19 Vaccine Bivalent Booster 91yrs & up 09/02/2021   Pneumococcal Conjugate-13 05/05/2013   Pneumococcal Polysaccharide-23 08/24/2014   Td 02/08/2002   Tdap 01/20/2013   Zoster Recombinant(Shingrix) 12/20/2012   Zoster, Live 12/20/2012    TDAP status: Due, Education has been provided regarding the importance of this vaccine. Advised may receive this vaccine at local pharmacy or Health Dept. Aware to provide a copy of the vaccination record if obtained from local pharmacy or Health Dept. Verbalized acceptance and understanding.  Flu Vaccine status: Up to date  Pneumococcal vaccine status: Up to date  Covid-19 vaccine status: Information provided on how to obtain vaccines.   Qualifies for Shingles Vaccine? Yes   Zostavax completed Yes   Shingrix Completed?: No.    Education has been provided regarding the importance of this vaccine. Patient has been advised to call insurance company to determine out of pocket expense if they have not yet received this vaccine.  Advised may also receive vaccine at local pharmacy or Health Dept. Verbalized acceptance and understanding.  Screening Tests Health Maintenance  Topic Date Due   Zoster Vaccines- Shingrix (2 of 2) 02/14/2013   DTaP/Tdap/Td (3 - Td or Tdap) 01/21/2023  COVID-19 Vaccine (5 - 2023-24 season) 04/12/2023   HEMOGLOBIN A1C  07/11/2023   Diabetic kidney evaluation - Urine ACR  08/30/2023   Diabetic kidney evaluation - eGFR measurement  10/17/2023   FOOT EXAM  12/05/2023   MAMMOGRAM  01/26/2024   OPHTHALMOLOGY EXAM  04/05/2024   Medicare Annual Wellness (AWV)  04/30/2024   DEXA SCAN  01/25/2025   Pneumonia Vaccine 67+ Years old  Completed   INFLUENZA VACCINE  Completed   Hepatitis C Screening  Completed   HPV VACCINES  Aged Out   Colonoscopy  Discontinued    Health Maintenance  Health Maintenance Due  Topic Date Due   Zoster Vaccines- Shingrix (2 of 2) 02/14/2013   DTaP/Tdap/Td (3 - Td or Tdap) 01/21/2023   COVID-19 Vaccine (5 - 2023-24 season) 04/12/2023    Colorectal cancer screening: No longer required.   Mammogram status: Completed 01/26/23. Repeat every year  Bone Density status: Completed 01/26/23. Results reflect: Bone density results: OSTEOPENIA. Repeat every 2 years.  Lung Cancer Screening: (Low Dose CT Chest recommended if Age 71-80 years, 20 pack-year currently smoking OR have quit w/in 15years.) does not qualify.   Lung Cancer Screening Referral: n/a  Additional Screening:  Hepatitis C Screening: does qualify; Completed 04/19/15  Vision Screening: Recommended annual ophthalmology exams for early detection of glaucoma and other disorders of the eye. Is the patient up to date with their annual eye exam?  Yes  Who is the provider or what is the name of the office in which the patient attends annual eye exams? Dr. Blima Ledger  If pt is not established with a provider, would they like to be referred to a provider to establish care? No .   Dental Screening: Recommended  annual dental exams for proper oral hygiene  Diabetic Foot Exam: Diabetic Foot Exam: Completed 12/05/22  Community Resource Referral / Chronic Care Management: CRR required this visit?  No   CCM required this visit?  No     Plan:     I have personally reviewed and noted the following in the patient's chart:   Medical and social history Use of alcohol, tobacco or illicit drugs  Current medications and supplements including opioid prescriptions. Patient is not currently taking opioid prescriptions. Functional ability and status Nutritional status Physical activity Advanced directives List of other physicians Hospitalizations, surgeries, and ER visits in previous 12 months Vitals Screenings to include cognitive, depression, and falls Referrals and appointments  In addition, I have reviewed and discussed with patient certain preventive protocols, quality metrics, and best practice recommendations. A written personalized care plan for preventive services as well as general preventive health recommendations were provided to patient.     Kandis Fantasia [place], [place]   7/82/9562   After Visit Summary: (MyChart) Due to this being a telephonic visit, the after visit summary with patients personalized plan was offered to patient via MyChart   Nurse Notes: No concerns at this time

## 2023-05-04 DIAGNOSIS — G5603 Carpal tunnel syndrome, bilateral upper limbs: Secondary | ICD-10-CM | POA: Diagnosis not present

## 2023-05-04 DIAGNOSIS — G5602 Carpal tunnel syndrome, left upper limb: Secondary | ICD-10-CM | POA: Diagnosis not present

## 2023-05-06 ENCOUNTER — Other Ambulatory Visit: Payer: Self-pay | Admitting: Student

## 2023-05-21 ENCOUNTER — Ambulatory Visit
Admission: RE | Admit: 2023-05-21 | Discharge: 2023-05-21 | Disposition: A | Discharge status: Home / Self Care | Payer: Medicare Other | Source: Ambulatory Visit | Attending: Neurosurgery | Admitting: Neurosurgery

## 2023-05-21 DIAGNOSIS — E237 Disorder of pituitary gland, unspecified: Secondary | ICD-10-CM | POA: Diagnosis not present

## 2023-05-21 DIAGNOSIS — D497 Neoplasm of unspecified behavior of endocrine glands and other parts of nervous system: Secondary | ICD-10-CM

## 2023-05-21 MED ORDER — GADOPICLENOL 0.5 MMOL/ML IV SOLN
10.0000 mL | Freq: Once | INTRAVENOUS | Status: AC | PRN
  Administered 2023-05-21: 10 mL via INTRAVENOUS

## 2023-05-22 ENCOUNTER — Other Ambulatory Visit: Payer: Self-pay

## 2023-05-22 DIAGNOSIS — E1140 Type 2 diabetes mellitus with diabetic neuropathy, unspecified: Secondary | ICD-10-CM

## 2023-05-22 NOTE — Telephone Encounter (Signed)
Patient calls nurse line requesting a refill on Gabapentin.   She reports she is no longer uses [facility].   Please send refill to [facility] on [address].

## 2023-05-25 MED ORDER — GABAPENTIN 100 MG PO CAPS: 100.0000 mg | ORAL_CAPSULE | Freq: Two times a day (BID) | ORAL | 1 refills | Status: DC

## 2023-05-28 DIAGNOSIS — D497 Neoplasm of unspecified behavior of endocrine glands and other parts of nervous system: Secondary | ICD-10-CM | POA: Diagnosis not present

## 2023-06-01 DIAGNOSIS — G5603 Carpal tunnel syndrome, bilateral upper limbs: Secondary | ICD-10-CM | POA: Diagnosis not present

## 2023-06-02 ENCOUNTER — Other Ambulatory Visit: Payer: Self-pay | Admitting: Student

## 2023-06-07 ENCOUNTER — Other Ambulatory Visit: Payer: Self-pay | Admitting: Hematology and Oncology

## 2023-06-08 DIAGNOSIS — G5603 Carpal tunnel syndrome, bilateral upper limbs: Secondary | ICD-10-CM | POA: Diagnosis not present

## 2023-06-18 ENCOUNTER — Telehealth: Payer: Self-pay

## 2023-06-18 ENCOUNTER — Inpatient Hospital Stay: Payer: Medicare Other | Attending: Adult Health | Admitting: Adult Health

## 2023-06-18 ENCOUNTER — Encounter: Payer: Self-pay | Admitting: Adult Health

## 2023-06-18 VITALS — BP 147/57 | HR 61 | Temp 97.5°F | Resp 18 | Wt 277.5 lb

## 2023-06-18 DIAGNOSIS — Z833 Family history of diabetes mellitus: Secondary | ICD-10-CM | POA: Diagnosis not present

## 2023-06-18 DIAGNOSIS — M79641 Pain in right hand: Secondary | ICD-10-CM | POA: Diagnosis not present

## 2023-06-18 DIAGNOSIS — Z8249 Family history of ischemic heart disease and other diseases of the circulatory system: Secondary | ICD-10-CM | POA: Diagnosis not present

## 2023-06-18 DIAGNOSIS — Z79811 Long term (current) use of aromatase inhibitors: Secondary | ICD-10-CM | POA: Insufficient documentation

## 2023-06-18 DIAGNOSIS — Z7901 Long term (current) use of anticoagulants: Secondary | ICD-10-CM | POA: Insufficient documentation

## 2023-06-18 DIAGNOSIS — Z79899 Other long term (current) drug therapy: Secondary | ICD-10-CM | POA: Insufficient documentation

## 2023-06-18 DIAGNOSIS — R232 Flushing: Secondary | ICD-10-CM | POA: Insufficient documentation

## 2023-06-18 DIAGNOSIS — I129 Hypertensive chronic kidney disease with stage 1 through stage 4 chronic kidney disease, or unspecified chronic kidney disease: Secondary | ICD-10-CM | POA: Diagnosis not present

## 2023-06-18 DIAGNOSIS — Z86718 Personal history of other venous thrombosis and embolism: Secondary | ICD-10-CM | POA: Insufficient documentation

## 2023-06-18 DIAGNOSIS — M8580 Other specified disorders of bone density and structure, unspecified site: Secondary | ICD-10-CM | POA: Diagnosis not present

## 2023-06-18 DIAGNOSIS — Z841 Family history of disorders of kidney and ureter: Secondary | ICD-10-CM | POA: Diagnosis not present

## 2023-06-18 DIAGNOSIS — D0511 Intraductal carcinoma in situ of right breast: Secondary | ICD-10-CM | POA: Diagnosis not present

## 2023-06-18 DIAGNOSIS — Z90710 Acquired absence of both cervix and uterus: Secondary | ICD-10-CM | POA: Diagnosis not present

## 2023-06-18 DIAGNOSIS — E079 Disorder of thyroid, unspecified: Secondary | ICD-10-CM | POA: Insufficient documentation

## 2023-06-18 DIAGNOSIS — N6315 Unspecified lump in the right breast, overlapping quadrants: Secondary | ICD-10-CM | POA: Diagnosis not present

## 2023-06-18 DIAGNOSIS — E1122 Type 2 diabetes mellitus with diabetic chronic kidney disease: Secondary | ICD-10-CM | POA: Insufficient documentation

## 2023-06-18 DIAGNOSIS — M069 Rheumatoid arthritis, unspecified: Secondary | ICD-10-CM | POA: Diagnosis not present

## 2023-06-18 DIAGNOSIS — Z8379 Family history of other diseases of the digestive system: Secondary | ICD-10-CM | POA: Diagnosis not present

## 2023-06-18 DIAGNOSIS — Z860101 Personal history of adenomatous and serrated colon polyps: Secondary | ICD-10-CM | POA: Diagnosis not present

## 2023-06-18 DIAGNOSIS — Z803 Family history of malignant neoplasm of breast: Secondary | ICD-10-CM | POA: Diagnosis not present

## 2023-06-18 DIAGNOSIS — Z888 Allergy status to other drugs, medicaments and biological substances status: Secondary | ICD-10-CM | POA: Diagnosis not present

## 2023-06-18 DIAGNOSIS — Z86018 Personal history of other benign neoplasm: Secondary | ICD-10-CM | POA: Diagnosis not present

## 2023-06-18 DIAGNOSIS — R630 Anorexia: Secondary | ICD-10-CM | POA: Insufficient documentation

## 2023-06-18 DIAGNOSIS — Z86711 Personal history of pulmonary embolism: Secondary | ICD-10-CM | POA: Insufficient documentation

## 2023-06-18 DIAGNOSIS — N1831 Chronic kidney disease, stage 3a: Secondary | ICD-10-CM | POA: Insufficient documentation

## 2023-06-18 NOTE — Assessment & Plan Note (Signed)
Erin Coffey is a 77 year old woman with history of right breast DCIS diagnosed in July 2023 status postlumpectomy, adjuvant radiation, and antiestrogen therapy with anastrozole which began in November 2023.  Right breast DCIS: She has no clinical signs of breast cancer recurrence.   Continue with annual mammograms.   She will continue anastrozole daily as she is tolerating this well. Bone health: Osteopenia on DEXA 01/26/2023, improving on Fosamax.  No issues reported with medication.  Continue Fosamax as currently prescribed. Breast Concern: Noted firmness in right breast post-surgery. No signs of infection. Recent mammogram was normal. Order diagnostic mammogram and ultrasound to further evaluate the area of concern.  Weight Loss: Patient has lost weight, possibly due to decreased appetite. Currently using fruit and vegetable smoothies as part of diet. Encourage continued weight loss. Advise on checking sugar content in smoothie ingredients and consider using unsweetened bases like almond milk. Food is Medicine Jumpstart Guide from [employer] and given to patient.  Rheumatoid Arthritis: Chronic condition, patient continues to be active despite aches and pains. Continue current management plan with PCP/rheumatology Hot Flashes: Occurring at night, causing patient to wake up once. Tolerable per patient Continue current management plan. General Health Maintenance Continue with current colon cancer screening plan as per primary care provider's recommendation. Advise on healthy diet and exercise. Follow-up in six months or sooner based on mammogram and ultrasound results.

## 2023-06-18 NOTE — Telephone Encounter (Signed)
Mammo and [name] orders faxed to [facility] [phone] per NP. Fax confirmation received.

## 2023-06-18 NOTE — Progress Notes (Signed)
[facility] Cancer Follow up:    Erin Martinez, MD [address] [place] [address]   DIAGNOSIS:  Cancer Staging  Ductal carcinoma in situ (DCIS) of right breast Staging form: Breast, AJCC 8th Edition - Clinical stage from 03/05/2022: Stage 0 (cTis (DCIS), cN0, cM0, ER+, PR+, HER2: Not Assessed) - Unsigned Stage prefix: Initial diagnosis Nuclear grade: G1 Laterality: Right Staged by: Pathologist and managing physician Stage used in treatment planning: Yes National guidelines used in treatment planning: Yes   SUMMARY OF ONCOLOGIC HISTORY: Oncology History  Ductal carcinoma in situ (DCIS) of right breast  01/20/2022 Mammogram   Digital screening mammogram with incomplete evaluation.  Right breast focal asymmetry indeterminate. Diagnostic mammogram and ultrasound showed no correlate.  Stereotactic biopsy was recommended   02/14/2022 Pathology Results   Pathology from the right breast needle core biopsy showed DCIS, low to intermediate nuclear grade, cribriform and micropapillary types without necrosis and foci suspicious for microinvasion.  Negative for microcalcifications.  DCIS measured 2.2 mm in greatest extent   03/03/2022 Initial Diagnosis   Ductal carcinoma in situ (DCIS) of right breast   03/25/2022 Surgery   She had a right breast lumpectomy which showed DCIS abutting the posterior margin of resection, additional breast tissue from posterior margin negative for DCIS.  No invasive carcinoma.  Prognostics from prior biopsy showed 100% ER positive and 100% PR positive.   04/30/2022 - 05/27/2022 Radiation Therapy   Site Technique Total Dose (Gy) Dose per Fx (Gy) Completed Fx Beam Energies  Breast, Right: Breast_R 3D 42.56/42.56 2.66 16/16 15X, 10XFFF  Breast, Right: Breast_R_Bst 3D 8/8 2 4/4 10X     06/2022 -  Anti-estrogen oral therapy   Anastrozole     CURRENT THERAPY: Anastrozole  INTERVAL HISTORY:  Discussed the use of AI scribe software for  clinical note transcription with the patient, who gave verbal consent to proceed.  Erin Coffey 77 y.o. female returns for f/u of her breast cancer.  Mammo 6/17 normal, breast density a. DEXA 01/26/2023 and demonstrated mild osteopenia.    Erin Coffey reports a change in the size and firmness of the previously operated breast. She has been experiencing a loss of appetite, which she attributes to either laziness or a lack of interest in food preparation. Despite this, she has been maintaining a diet that includes fruit and vegetable smoothies, with a base of juice or yogurt. She has also been managing her rheumatoid arthritis symptoms and continues to be active despite the associated aches and pains.  The patient is on Fosamax for osteopenia and reports no issues with this medication. She also takes Anastrozole daily. She has been experiencing hot flashes, particularly at night, which often result in her waking up drenched in sweat. This typically occurs once a night, around 2 am, when she also wakes up to use the restroom. Despite this, she is able to fall back asleep and reports getting adequate sleep overall.  The patient has been keeping up with her primary care provider and has completed colon cancer screening. She has a history of diverticulitis, which occasionally flares up. She was recently diagnosed with carpal tunnel syndrome in the left arm, for which surgery is planned.  Patient Active Problem List   Diagnosis Date Noted   Numbness and tingling 04/27/2023   Rheumatoid arthritis involving right hand (HCC) 03/23/2023   Right hand pain 03/23/2023   Hand tingling 03/23/2023   Family history of breast cancer 03/05/2022   Ductal carcinoma in situ (  DCIS) of right breast 03/03/2022   Fatigue 09/04/2021   Closed compression fracture of first lumbar vertebra (HCC) 04/09/2021   BPPV (benign paroxysmal positional vertigo), left 04/04/2021   Peripheral vertigo 01/15/2021   Pituitary mass (HCC)  12/31/2020   Left anterior knee pain 12/13/2020   Chronic kidney disease, stage 3a (HCC) 12/13/2020   Abdominal pain 04/30/2020   Body mass index (BMI) 40.0-44.9, adult (HCC) 01/26/2020   Mild coronary artery disease 10/2019   History of spinal fracture 03/24/2019   Age-related osteoporosis with current pathological fracture 03/24/2019   Nontraumatic complete tear of left rotator cuff 03/24/2019   Primary hypercoagulable state (HCC) 03/07/2019   Osteoporotic compression fracture of spine (HCC) 03/07/2019   Pulmonary emboli (HCC) 03/03/2019   Chronic left hip pain 08/17/2018   Left sided numbness 07/22/2018   Low back pain potentially associated with radiculopathy 01/29/2018   Diverticulosis of colon 03/24/2017   Hypertensive retinopathy of right eye 01/20/2017   Hypermetropia of both eyes 01/20/2017   Back pain 01/05/2017   Chronic kidney disease 04/29/2016   Anemia 04/29/2016   Rotator cuff dysfunction 06/25/2015   Long term (current) use of anticoagulants 05/08/2011   Hyperlipidemia associated with type 2 diabetes mellitus (HCC) 08/08/2008   Type 2 diabetes mellitus with hemoglobin A1c goal of less than 7.5% (HCC) 06/05/2008   HYPERTENSION, BENIGN ESSENTIAL 11/27/2006   Morbid (severe) obesity due to excess calories (HCC) 10/08/2006   GASTROESOPHAGEAL REFLUX, NO ESOPHAGITIS 10/08/2006   Osteoarthritis, multiple sites 10/08/2006    is allergic to lisinopril.  MEDICAL HISTORY: Past Medical History:  Diagnosis Date   Allergy    Anemia, iron deficiency    ANXIETY, SITUATIONAL 04/06/2009   Qualifier: Diagnosis of  By: Sandi Mealy  MD, Stephanie     Arthritis    Back pain, thoracic 12/20/2012   Carpal tunnel syndrome 12/07/2014   Cholelithiasis 06/25/2012   Chronic kidney disease    DM type 2 goal A1C below 7.5 06/05/2008   Qualifier: Diagnosis of  By: Sandi Mealy  MD, Judeth Cornfield     Family history of breast cancer 03/05/2022   GASTROESOPHAGEAL REFLUX, NO ESOPHAGITIS 10/08/2006    Qualifier: Diagnosis of  By: De Burrs, unspecified 10/21/2007   Qualifier: Diagnosis of  By: Gavin Potters MD, [name]     History of DVT (deep vein thrombosis) 05/05/2013   on Eliquis   HYPERLIPIDEMIA 08/08/2008   Qualifier: Diagnosis of  By: Sandi Mealy  MD, Stephanie     HYPERTENSION, BENIGN ESSENTIAL 11/27/2006   Qualifier: Diagnosis of  ByGavin Potters MD, [name]     Mild coronary artery disease 10/2019   Coronary CT Angiogram: Coronary calcium score 46.Normal coronary origin.  Left dominance with tortuous coronary arteries.  Nondominant RCA - prox < 25% followed by ectasia (dilated to 5.5 mm) mixed plaque in distal vessel.  LM- normal . LAD: minimal mixed plaque in ostial & proximal (<25%) with brief IM bridging in mLAD. Mild mid-distal (25-49%), RI - prox <25%, Large LCx- minimal (<25%) mid-dista   Mild coronary artery disease    Personal history of colonic polyps 10/24/2009   Qualifier: Diagnosis of  By: Arlyce Dice MD, Barbette Hair    [place] 08/08/2009   Qualifier: Diagnosis of  By: [place], [name]     Pituitary adenoma [facility])    Polymyalgia rheumatica (HCC) 10/02/2009   Qualifier: Diagnosis of  By: Hulen Luster MD, Fleet Contras     Pulmonary embolism [facility]) - On long Term anticoagulation (Z79.01) 04/28/2011   Converted from  warfarin to Eliquis; on long-term anticoagulation.   Thyroid disease    Trigger ring finger of left hand 11/02/2014    SURGICAL HISTORY: Past Surgical History:  Procedure Laterality Date   ABDOMINAL HYSTERECTOMY     BREAST LUMPECTOMY WITH RADIOACTIVE SEED LOCALIZATION Right 03/25/2022   Procedure: RIGHT BREAST SEED LUMPECTOMY;  Surgeon: Harriette Bouillon, MD;  Location: [facility];  Service: General;  Laterality: Right;   CARDIAC CATHETERIZATION  04/29/2011   (Dr. Riley Kill - [place]): LM - normal. LAD with 1 major Diag - normal, bifurcating Ramus - normal, Large (6-7 mm) Dominant LCx with 1 OM, 2 LPL branches & LPDA - non significant disease; small non-dominant RCA.      CARDIAC EVENT MONITOR  09/2019   Mostly sinus rhythm, average rate 60 bpm.  Range 45 to 190 bpm.  No critical events indicating any arrhythmias or pauses.  For patient responses noted PVCs.  Less than 1% PVCs noted.    CARPAL TUNNEL RELEASE Right 1996   COLONOSCOPY     KNEE ARTHROSCOPY  Bilateral   masctomy     TRANSTHORACIC ECHOCARDIOGRAM  09/2019   Normal EF - 60 to 65%.  Mild LVH.  GR 1 DD.  Normal RV.  Trivial MR.  Normal IVC.  Normal PA pressures.   TUBAL LIGATION     UPPER GASTROINTESTINAL ENDOSCOPY      SOCIAL HISTORY: Social History   Socioeconomic History   Marital status: Widowed    Spouse name: Not on file   Number of children: 3   Years of education: 10   Highest education level: 10th grade  Occupational History   Occupation: Retired- Multimedia programmer: [employer]  Tobacco Use   Smoking status: Never    Passive exposure: Never   Smokeless tobacco: Never  Vaping Use   Vaping status: Never Used  Substance and Sexual Activity   Alcohol use: No    Alcohol/week: 0.0 standard drinks of alcohol   Drug use: No   Sexual activity: Not Currently  Other Topics Concern   Not on file  Social History Narrative   Patient lives alone in [place], she is a widow.   Patient has 3 children. Two daughters live locally, one son in [place].    Patient is very active in her church and volunteer work to stay busy.    Patient enjoys cooking, spending time with family, and crossword puzzles.       Social Determinants of Health   Financial Resource Strain: Low Risk  (05/01/2023)   Overall Financial Resource Strain (CARDIA)    Difficulty of Paying Living Expenses: Not hard at all  Food Insecurity: No Food Insecurity (05/01/2023)   Hunger Vital Sign    Worried About Running Out of Food in the Last Year: Never true    Ran Out of Food in the Last Year: Never true  Transportation Needs: No Transportation Needs (05/01/2023)   PRAPARE - Administrator, Civil Service  (Medical): No    Lack of Transportation (Non-Medical): No  Physical Activity: Insufficiently Active (05/01/2023)   Exercise Vital Sign    Days of Exercise per Week: 3 days    Minutes of Exercise per Session: 30 min  Stress: No Stress Concern Present (05/01/2023)   [employer] of Occupational Health - Occupational Stress Questionnaire    Feeling of Stress : Not at all  Social Connections: Moderately Integrated (05/01/2023)   Social Connection and Isolation Panel [NHANES]  Frequency of Communication with Friends and Family: More than three times a week    Frequency of Social Gatherings with Friends and Family: Three times a week    Attends Religious Services: More than 4 times per year    Active Member of Clubs or Organizations: Yes    Attends Banker Meetings: More than 4 times per year    Marital Status: Widowed  Intimate Partner Violence: Not At Risk (05/01/2023)   Humiliation, Afraid, Rape, and Kick questionnaire    Fear of Current or Ex-Partner: No    Emotionally Abused: No    Physically Abused: No    Sexually Abused: No    FAMILY HISTORY: Family History  Problem Relation Age of Onset   Diabetes Mother    Heart disease Mother    Hypertension Mother    Heart disease Father    Hypertension Father    Diabetes Sister    Heart disease Sister    Leukemia Sister 13   Cirrhosis Sister        alcohol   Kidney disease Sister    Diabetes Sister    Hypertension Brother    Diabetes Brother    Breast cancer Cousin        dx 38s; maternal female cousin   Colon cancer Neg Hx    Pancreatic cancer Neg Hx    Esophageal cancer Neg Hx     Review of Systems  Constitutional:  Negative for appetite change, chills, fatigue, fever and unexpected weight change.  HENT:   Negative for hearing loss, lump/mass and trouble swallowing.   Eyes:  Negative for eye problems and icterus.  Respiratory:  Negative for chest tightness, cough and shortness of breath.   Cardiovascular:   Negative for chest pain, leg swelling and palpitations.  Gastrointestinal:  Negative for abdominal distention, abdominal pain, constipation, diarrhea, nausea and vomiting.  Endocrine: Negative for hot flashes.  Genitourinary:  Negative for difficulty urinating.   Musculoskeletal:  Negative for arthralgias.  Skin:  Negative for itching and rash.  Neurological:  Negative for dizziness, extremity weakness, headaches and numbness.  Hematological:  Negative for adenopathy. Does not bruise/bleed easily.  Psychiatric/Behavioral:  Negative for depression. The patient is not nervous/anxious.       PHYSICAL EXAMINATION    Vitals:   06/18/23 0844  BP: (!) 147/57  Pulse: 61  Resp: 18  Temp: (!) 97.5 F (36.4 C)  SpO2: 100%    Physical Exam Constitutional:      General: She is not in acute distress.    Appearance: Normal appearance. She is not toxic-appearing.  HENT:     Head: Normocephalic and atraumatic.     Mouth/Throat:     Mouth: Mucous membranes are moist.     Pharynx: Oropharynx is clear. No oropharyngeal exudate or posterior oropharyngeal erythema.  Eyes:     General: No scleral icterus. Cardiovascular:     Rate and Rhythm: Normal rate and regular rhythm.     Pulses: Normal pulses.     Heart sounds: Normal heart sounds.  Pulmonary:     Effort: Pulmonary effort is normal.     Breath sounds: Normal breath sounds.  Chest:     Comments: Right breast: thickened area about 2cm in size at 12 oclock, 2cmfn, left breast benign Abdominal:     General: Abdomen is flat. Bowel sounds are normal. There is no distension.     Palpations: Abdomen is soft.     Tenderness: There is no abdominal tenderness.  Musculoskeletal:        General: No swelling.     Cervical back: Neck supple.  Lymphadenopathy:     Cervical: No cervical adenopathy.     Upper Body:     Right upper body: No axillary adenopathy.     Left upper body: No axillary adenopathy.  Skin:    General: Skin is warm and  dry.     Findings: No rash.  Neurological:     General: No focal deficit present.     Mental Status: She is alert.  Psychiatric:        Mood and Affect: Mood normal.        Behavior: Behavior normal.     LABORATORY DATA:  CBC    Component Value Date/Time   WBC 4.2 10/17/2022 1219   RBC 3.63 (L) 10/17/2022 1219   HGB 10.8 (L) 10/17/2022 1219   HGB 11.2 (L) 03/05/2022 0806   HGB 11.2 01/17/2022 0924   HCT 33.4 (L) 10/17/2022 1219   HCT 34.2 01/17/2022 0924   PLT 195 10/17/2022 1219   PLT 189 03/05/2022 0806   PLT 222 01/17/2022 0924   MCV 92.0 10/17/2022 1219   MCV 89 01/17/2022 0924   MCH 29.8 10/17/2022 1219   MCHC 32.3 10/17/2022 1219   RDW 15.9 (H) 10/17/2022 1219   RDW 13.9 01/17/2022 0924   LYMPHSABS 1.6 10/17/2022 1219   LYMPHSABS 2.5 04/30/2020 1505   MONOABS 0.6 10/17/2022 1219   EOSABS 0.2 10/17/2022 1219   EOSABS 0.3 04/30/2020 1505   BASOSABS 0.0 10/17/2022 1219   BASOSABS 0.0 04/30/2020 1505    CMP     Component Value Date/Time   NA 135 10/17/2022 1219   NA 139 01/17/2022 0924   K 4.5 10/17/2022 1219   CL 103 10/17/2022 1219   CO2 23 10/17/2022 1219   GLUCOSE 88 10/17/2022 1219   BUN 17 10/17/2022 1219   BUN 19 01/17/2022 0924   CREATININE 1.21 (H) 10/17/2022 1219   CREATININE 1.38 (H) 03/05/2022 0806   CREATININE 1.22 (H) 04/29/2016 0914   CALCIUM 9.4 10/17/2022 1219   PROT 7.9 10/17/2022 1219   PROT 8.0 04/30/2020 1505   ALBUMIN 3.4 (L) 10/17/2022 1219   ALBUMIN 3.8 12/13/2020 1644   AST 28 10/17/2022 1219   AST 22 03/05/2022 0806   ALT 12 10/17/2022 1219   ALT 14 03/05/2022 0806   ALKPHOS 51 10/17/2022 1219   BILITOT 1.1 10/17/2022 1219   BILITOT 0.6 03/05/2022 0806   GFRNONAA 47 (L) 10/17/2022 1219   GFRNONAA 40 (L) 03/05/2022 0806   GFRNONAA 46 (L) 04/29/2016 0914   GFRAA 46 (L) 04/30/2020 1505   GFRAA 53 (L) 04/29/2016 0914       ASSESSMENT and THERAPY PLAN:   Ductal carcinoma in situ (DCIS) of right breast Erin Coffey is a  77 year old woman with history of right breast DCIS diagnosed in July 2023 status postlumpectomy, adjuvant radiation, and antiestrogen therapy with anastrozole which began in November 2023.  Right breast DCIS: She has no clinical signs of breast cancer recurrence.   Continue with annual mammograms.   She will continue anastrozole daily as she is tolerating this well. Bone health: Osteopenia on DEXA 01/26/2023, improving on Fosamax.  No issues reported with medication.  Continue Fosamax as currently prescribed. Breast Concern: Noted firmness in right breast post-surgery. No signs of infection. Recent mammogram was normal. Order diagnostic mammogram and ultrasound to further evaluate the area of concern.  Weight Loss: Patient has lost  weight, possibly due to decreased appetite. Currently using fruit and vegetable smoothies as part of diet. Encourage continued weight loss. Advise on checking sugar content in smoothie ingredients and consider using unsweetened bases like almond milk. Food is Medicine Jumpstart Guide from [employer] and given to patient.  Rheumatoid Arthritis: Chronic condition, patient continues to be active despite aches and pains. Continue current management plan with PCP/rheumatology Hot Flashes: Occurring at night, causing patient to wake up once. Tolerable per patient Continue current management plan. General Health Maintenance Continue with current colon cancer screening plan as per primary care provider's recommendation. Advise on healthy diet and exercise. Follow-up in six months or sooner based on mammogram and ultrasound results.   All questions were answered. The patient knows to call the clinic with any problems, questions or concerns. We can certainly see the patient much sooner if necessary.  Total encounter time:30 minutes*in face-to-face visit time, chart review, lab review, care coordination, order entry, and documentation of the encounter time.    Lillard Anes, NP 06/18/23 9:20 AM Medical Oncology and Hematology [facility] [address] [place], [place] [address] Tel. [phone]    Fax. [phone]  *Total Encounter Time as defined by the Centers for Medicare and Medicaid Services includes, in addition to the face-to-face time of a patient visit (documented in the note above) non-face-to-face time: obtaining and reviewing outside history, ordering and reviewing medications, tests or procedures, care coordination (communications with other health care professionals or caregivers) and documentation in the medical record.

## 2023-06-22 ENCOUNTER — Encounter: Payer: Self-pay | Admitting: Student

## 2023-06-22 ENCOUNTER — Ambulatory Visit (INDEPENDENT_AMBULATORY_CARE_PROVIDER_SITE_OTHER): Payer: Medicare Other | Admitting: Student

## 2023-06-22 ENCOUNTER — Telehealth: Payer: Self-pay

## 2023-06-22 VITALS — BP 131/60 | HR 58 | Ht 68.0 in | Wt 277.8 lb

## 2023-06-22 DIAGNOSIS — I10 Essential (primary) hypertension: Secondary | ICD-10-CM

## 2023-06-22 DIAGNOSIS — G8929 Other chronic pain: Secondary | ICD-10-CM | POA: Diagnosis not present

## 2023-06-22 DIAGNOSIS — R5383 Other fatigue: Secondary | ICD-10-CM | POA: Diagnosis not present

## 2023-06-22 DIAGNOSIS — R801 Persistent proteinuria, unspecified: Secondary | ICD-10-CM

## 2023-06-22 DIAGNOSIS — M545 Low back pain, unspecified: Secondary | ICD-10-CM

## 2023-06-22 DIAGNOSIS — E119 Type 2 diabetes mellitus without complications: Secondary | ICD-10-CM

## 2023-06-22 LAB — POCT GLYCOSYLATED HEMOGLOBIN (HGB A1C): HbA1c, POC (controlled diabetic range): 5.8 % (ref 0.0–7.0)

## 2023-06-22 MED ORDER — TRAMADOL HCL 50 MG PO TABS
50.0000 mg | ORAL_TABLET | Freq: Four times a day (QID) | ORAL | 0 refills | Status: DC | PRN
Start: 2023-06-22 — End: 2023-10-26

## 2023-06-22 MED ORDER — LOSARTAN POTASSIUM 100 MG PO TABS
100.0000 mg | ORAL_TABLET | Freq: Every day | ORAL | 1 refills | Status: DC
Start: 2023-06-22 — End: 2023-08-17

## 2023-06-22 NOTE — Assessment & Plan Note (Deleted)
Ha1c evaluated in clinic today and was %. - Continue Farxiga 10 mg daily - Will arrange for lipid panel to evaluate cholesterol level

## 2023-06-22 NOTE — Progress Notes (Signed)
Pharmacy Medication Assistance Program Note    06/22/2023  Patient ID: Erin Coffey, female   DOB: 1945-12-28, 77 y.o.   MRN: [id number]     06/22/2023  Outreach [employer]  Manufacturer Medication One Nurse, adult Drugs Farxiga  Dose of Farxiga 10MG   Type of Assistance Manufacturer Assistance  Name of Prescriber Alfredo Martinez  Patient Assistance Determination Approved  Approval Start Date 08/12/2023  Approval End Date 08/10/2024      Medication will ship to PATIENTS HOME  Pt ID: [id number]  Company phone: [phone]

## 2023-06-22 NOTE — Addendum Note (Signed)
Addended by: Jennette Bill on: 06/22/2023 02:43 PM   Modules accepted: Orders

## 2023-06-22 NOTE — Assessment & Plan Note (Deleted)
Pt reports fatigue and lack of energy since summer 2024. Possibly related to anastrozole, started by her oncologist in 04/2022. Less concern this is related to [place]. Will order lab work to evaluate for anemia, thyroid or kidney or liver disease, electrolyte abnormalities.  - Lab work: CBC, iron panel, CMP, TSH - Encouraged pt to reach out to oncologist about experiencing fatigue on anastrozole - Encouraged pt to keep moving

## 2023-06-22 NOTE — Progress Notes (Deleted)
    SUBJECTIVE:   CHIEF COMPLAINT / HPI:   FATIGUE Fatigue started:  *** The Tiredness is described as *** Feels like doing activities but can't: *** Does not feel like doing things: *** Feels the fatigue is due to: ***  Symptoms Fever: *** Sweating at night: *** Weight Loss: *** Shortness of Breath: *** Coughing up Blood: *** Muscle Pain or Weakness: *** Black or bloody Stools: *** Severe Snoring or Daytime Sleepiness: *** Feeling Down: *** Not enjoying things: *** Rash: *** Leg or Joint Swelling: *** Chest Pain or irregular heart beat:  ***      PERTINENT  PMH / PSH: ***  OBJECTIVE:   BP 131/60   Pulse (!) 58   Ht 5\' 8"  (1.727 m)   Wt 277 lb 12.8 oz (126 kg)   SpO2 100%   BMI 42.24 kg/m   General: Alert and oriented in no apparent distress Heart: Regular rate and rhythm with no murmurs appreciated Lungs: CTA bilaterally, no wheezing Abdomen: Bowel sounds present, no abdominal pain Skin: Warm and dry Extremities: No lower extremity edema   ASSESSMENT/PLAN:   Assessment & Plan Fatigue, unspecified type Pt reports fatigue and lack of energy since summer 2024. Possibly related to anastrozole, started by her oncologist in 04/2022. Less concern this is related to [place]. Will order lab work to evaluate for anemia, thyroid or kidney or liver disease, electrolyte abnormalities.  - Lab work: CBC, iron panel, CMP, TSH - Encouraged pt to reach out to oncologist about experiencing fatigue on anastrozole - Encouraged pt to keep moving Type 2 diabetes mellitus with hemoglobin A1c goal of less than 7.5% (HCC) Ha1c evaluated in clinic today and was %. - Continue Farxiga 10 mg daily - Will arrange for lipid panel to evaluate cholesterol level Essential hypertension, benign  Chronic bilateral low back pain without sciatica    The most likely diagnosis is ***   I have considered and concluded the patient has a very low likelihood of having: Sleep apnea as the  patient does not fall asleep during the day or feel as if they are having difficulty sleeping throughout the night and do not have evidence of high mallampati scoring ***, Substance cause (beta blocker, clonidine, alcohol, carbon monoxide),  Neurologic cause (ALS, MS, Guillain-Barre, Parkinsons, Myasthenia Gravis) without evidence of abnormalities on neurologic exam or evidence of acute neurologic red flags,  Infections (mono, hepatitis, tuberculosis, endocarditis, HIV, CMV, Lyme, Fungal (Histo/[place]occido)) without systemic symptoms, Pregnancy, Heart failure, Myocardial ischemia, Celiac disease, Cancer, lymphoma, Polymyalgia rheumatica, Adrenal insufficiency, Hypopituitarism, Post-Lyme disease. For the patient we will check basic labs CBC, CMP, TSH, CK if muscle weakness or pain is associated. Patient will need HIV and Hep C testing as well. No evidence of depression with PHQ9 of ***.    Alfredo Martinez, MD [facility] [place]

## 2023-06-22 NOTE — Progress Notes (Signed)
    SUBJECTIVE:   CHIEF COMPLAINT / HPI:   Erin Coffey is a 77 y.o. female who presents today for fatigue.  FATIGUE Reports fatigue started summer 2024. Described as running out of energy when she starts a task, she has to rest. Believes it might be related to starting Farxiga (started 11/2022) and anastrozole (started 04/2022). Gradual onset. Reports she sleeps good and feels rested when she wakes.   Symptoms Fever: None Sweating at night: Yes, due to cancer drug Weight Loss: Yes, 3 lbs down from 04/2023 and reports occasional decreased appetite Shortness of Breath: None Coughing up Blood: None Muscle Pain or Weakness: With RA, stable Black or bloody Stools: None Severe Snoring or Daytime Sleepiness: None Feeling Down: None Not enjoying things: None Rash: None Leg or Joint Swelling: Stable with RA Chest Pain or irregular heart beat:  None  Appointment with her oncologist 06/18/23 and reports she needs a mammogram after practitioner felt a lump on R breast.  Will plan to come back for COVID booster end of week.  PERTINENT  PMH / PSH: History of rheumatoid arthritis, pulmonary emboli, morbid obesity, coronary artery disease, hypertension, hyperlipidemia, chronic kidney disease, type 2 diabetes, history of pituitary mass, osteoporosis, DCIS of the right breast, anemia, GERD, vertigo  OBJECTIVE:   BP 131/60   Pulse (!) 58   Ht 5\' 8"  (1.727 m)   Wt 277 lb 12.8 oz (126 kg)   SpO2 100%   BMI 42.24 kg/m   General: Pt is well appearing and seated on chair, no acute distress. Cardiovascular: RRR, no murmurs, rubs, gallops. Pulmonary: Normal work of breathing. Lungs clear to auscultation bilaterally. Neuro/Psych: Alert and oriented to person, place, event, time. Normal affect.  ASSESSMENT/PLAN:   Fatigue Pt reports fatigue and lack of energy since summer 2024. Possibly related to anastrozole, started by her oncologist in 04/2022. Less concern this is related to [place].  Will order lab work to evaluate for anemia, thyroid or kidney or liver disease, electrolyte abnormalities. Consider sleep study to rule out OSA, patient declined today but would be on differential.  - Lab work: CBC, iron panel, CMP, TSH - Encouraged pt to reach out to oncologist about experiencing fatigue on anastrozole - Encouraged pt to keep moving  Type 2 diabetes mellitus with hemoglobin A1c goal of less than 7.5% (HCC) Ha1c evaluated in clinic today and will call with results.  - Continue Farxiga 10 mg daily for now  - Will arrange for lipid panel to evaluate cholesterol level    Governor Rooks, Medical Student [facility]  Upper Level Addendum:  I have seen and evaluated this patient along with Student Dr. Rema Jasmine and reviewed the above note, making necessary revisions as appropriate.  I agree with the medical decision making and physical exam as noted above.  Alfredo Martinez, MD PGY-3 [facility]

## 2023-06-22 NOTE — Patient Instructions (Addendum)
It was great to see you today! Thank you for choosing [facility] for your primary care.  Today we addressed: Fatigue - this is possibly because of your cancer medication, anastrozole; please call your oncologist and let them know about this possible side effect! We also got some labs, and results will be in your MyChart in a few days Please make an appointment to get your Covid booster at the end of the week  If you haven't already, sign up for My Chart to have easy access to your labs results, and communication with your primary care physician. I recommend that you always bring your medications to each appointment as this makes it easy to ensure you are on the correct medications and helps [name] not miss refills when you need them. Call the clinic at [phone] if your symptoms worsen or you have any concerns. Return in about 4 weeks (around 07/20/2023) for Fatigue . Please arrive 15 minutes before your appointment to ensure smooth check in process.  We appreciate your efforts in making this happen.  Thank you for allowing me to participate in your care, Dr. Alfredo Martinez 06/22/2023, 2:21 PM PGY-3, [facility] Family Medicine

## 2023-06-23 LAB — LIPID PANEL
Chol/HDL Ratio: 2.2 ratio (ref 0.0–4.4)
Cholesterol, Total: 171 mg/dL (ref 100–199)
HDL: 79 mg/dL (ref >39)
LDL Chol Calc (NIH): 80 mg/dL (ref 0–99)
Triglycerides: 61 mg/dL (ref 0–149)
VLDL Cholesterol Cal: 12 mg/dL (ref 5–40)

## 2023-06-23 LAB — COMPREHENSIVE METABOLIC PANEL
ALT: 11 [IU]/L (ref 0–32)
AST: 20 [IU]/L (ref 0–40)
Albumin: 4.1 g/dL (ref 3.8–4.8)
Alkaline Phosphatase: 72 [IU]/L (ref 44–121)
BUN/Creatinine Ratio: 12 (ref 12–28)
BUN: 18 mg/dL (ref 8–27)
Bilirubin Total: 0.4 mg/dL (ref 0.0–1.2)
CO2: 21 mmol/L (ref 20–29)
Calcium: 9.6 mg/dL (ref 8.7–10.3)
Chloride: 105 mmol/L (ref 96–106)
Creatinine, Ser: 1.5 mg/dL — ABNORMAL HIGH (ref 0.57–1.00)
Globulin, Total: 3.6 g/dL (ref 1.5–4.5)
Glucose: 100 mg/dL — ABNORMAL HIGH (ref 70–99)
Potassium: 4.7 mmol/L (ref 3.5–5.2)
Sodium: 139 mmol/L (ref 134–144)
Total Protein: 7.7 g/dL (ref 6.0–8.5)
eGFR: 36 mL/min/{1.73_m2} — ABNORMAL LOW (ref >59)

## 2023-06-23 LAB — CBC
Hematocrit: 33.4 % — ABNORMAL LOW (ref 34.0–46.6)
Hemoglobin: 10.9 g/dL — ABNORMAL LOW (ref 11.1–15.9)
MCH: 29.8 pg (ref 26.6–33.0)
MCHC: 32.6 g/dL (ref 31.5–35.7)
MCV: 91 fL (ref 79–97)
Platelets: 217 10*3/uL (ref 150–450)
RBC: 3.66 x10E6/uL — ABNORMAL LOW (ref 3.77–5.28)
RDW: 14.5 % (ref 11.7–15.4)
WBC: 4.7 10*3/uL (ref 3.4–10.8)

## 2023-06-23 LAB — IRON,TIBC AND FERRITIN PANEL
Ferritin: 338 ng/mL — ABNORMAL HIGH (ref 15–150)
Iron Saturation: 24 % (ref 15–55)
Iron: 49 ug/dL (ref 27–139)
Total Iron Binding Capacity: 208 ug/dL — ABNORMAL LOW (ref 250–450)
UIBC: 159 ug/dL (ref 118–369)

## 2023-06-23 LAB — TSH: TSH: 1.66 u[IU]/mL (ref 0.450–4.500)

## 2023-06-29 ENCOUNTER — Encounter: Payer: Self-pay | Admitting: Student

## 2023-07-02 DIAGNOSIS — N6315 Unspecified lump in the right breast, overlapping quadrants: Secondary | ICD-10-CM | POA: Diagnosis not present

## 2023-07-02 DIAGNOSIS — L7634 Postprocedural seroma of skin and subcutaneous tissue following other procedure: Secondary | ICD-10-CM | POA: Diagnosis not present

## 2023-07-03 ENCOUNTER — Encounter: Payer: Self-pay | Admitting: Adult Health

## 2023-07-03 NOTE — Telephone Encounter (Signed)
Please send 90 day RX of Farxiga 10mg  (w/ refills) to [facility] pharmacy. Patient will receive from company.

## 2023-07-07 ENCOUNTER — Ambulatory Visit: Payer: Medicare Other | Admitting: Podiatry

## 2023-07-07 MED ORDER — DAPAGLIFLOZIN PROPANEDIOL 10 MG PO TABS: 10.0000 mg | ORAL_TABLET | Freq: Every day | ORAL | 3 refills | Status: DC

## 2023-07-07 NOTE — Addendum Note (Signed)
Addended by: Alfredo Martinez on: 07/07/2023 03:07 PM   Modules accepted: Orders

## 2023-07-13 ENCOUNTER — Ambulatory Visit (INDEPENDENT_AMBULATORY_CARE_PROVIDER_SITE_OTHER): Payer: Medicare Other

## 2023-07-13 DIAGNOSIS — Z23 Encounter for immunization: Secondary | ICD-10-CM

## 2023-07-13 NOTE — Progress Notes (Signed)
Patient presents to nurse clinic for Covid vaccine.  Vaccine given in LD without complication.

## 2023-07-20 ENCOUNTER — Ambulatory Visit: Payer: Medicare Other | Admitting: Student

## 2023-07-20 ENCOUNTER — Encounter: Payer: Self-pay | Admitting: Student

## 2023-07-20 VITALS — BP 134/54 | HR 57 | Ht 68.0 in | Wt 272.5 lb

## 2023-07-20 DIAGNOSIS — R634 Abnormal weight loss: Secondary | ICD-10-CM

## 2023-07-20 LAB — POCT SEDIMENTATION RATE: POCT SED RATE: 60 mm/h — AB (ref 0–22)

## 2023-07-20 NOTE — Patient Instructions (Signed)
It was great to see you today! Thank you for choosing [facility] for your primary care.  Today we addressed: We will check some labs today I will message your oncologist  We will also check urine  STOP the farxiga for now until I see you again  Follow up in 1 month   If you haven't already, sign up for My Chart to have easy access to your labs results, and communication with your primary care physician.   Please arrive 15 minutes before your appointment to ensure smooth check in process.  We appreciate your efforts in making this happen.  Thank you for allowing me to participate in your care, Alfredo Martinez, MD 07/20/2023, 8:50 AM PGY-3, [facility]

## 2023-07-20 NOTE — Progress Notes (Signed)
    SUBJECTIVE:   CHIEF COMPLAINT / HPI:   Fatigue Follow Up Weight Loss:  Per last note:  Fatigue started Summer 2024   Symptoms Fever: None Sweating at night: Yes, due to cancer drug Weight Loss: Yes, several lbs down from 04/2023 and reports occasional decreased appetite. Patient reports that she has lost 5 pounds since last visit.  Weight 277 > 272 pounds in clinic.  She reports that she does not have an appetite and is too tired to cook intermittently. Used to weight 290s Shortness of Breath: None Coughing up Blood: None Muscle Pain or Weakness: With RA, stable Black or bloody Stools: None Severe Snoring or Daytime Sleepiness: None Feeling Down: None Not enjoying things: None Rash: None Leg or Joint Swelling: Stable with RA Chest Pain or irregular heart beat:  None Denies difficulty with sleeping.   Patient reports that her fatigue and weight loss initially started after starting [place] for kidney protection.   PERTINENT  PMH / PSH:  History of rheumatoid arthritis, pulmonary emboli, morbid obesity, coronary artery disease, hypertension, hyperlipidemia, chronic kidney disease, type 2 diabetes, history of pituitary mass, osteoporosis, DCIS of the right breast, anemia, GERD, vertigo   Last MR brain 05/21/23:  1. No change in the appearance of the pituitary mass with suprasellar extension and right cavernous sinus extension. Mild mass-effect upon the under surface of the optic chiasm. 2. Age related volume loss and mild small vessel change of the white matter.   OBJECTIVE:   BP (!) 134/54   Pulse (!) 57   Ht 5\' 8"  (1.727 m)   Wt 272 lb 8 oz (123.6 kg)   SpO2 100%   BMI 41.43 kg/m   General: Alert and oriented in no apparent distress Heart: Regular rate and rhythm with no murmurs appreciated Lungs: CTA bilaterally, no wheezing Abdomen: Bowel sounds present, no abdominal pain Skin: Warm and dry Neuro: Neurovascularly intact, normal sensation of upper and lower  extremities, normal gait, no dysarthria or facial droop   ASSESSMENT/PLAN:   Assessment & Plan Weight loss Broad ddx: Related to fatigue, medication induced, does not seem to be related to the cancer history as her last mammogram was unremarkable without evidence of malignancy.  Will continue with LDH, ESR, CRP for further assessment of secondary causes of weight loss.  Additionally, we will trial off of Farxiga given patient's association with this and current symptoms.  Obtaining UACR for further assessment of kidney function.  Additionally, will message oncology NP for further discussion.  Assess PHQ-9 at next visit.  Rheumatoid arthritis stable making this less likely, additionally with pituitary tumor but last MRI from 2 months ago showed stability.  Also, neurologically intact on our assessment. Return in 1 month, scheduled visit in office with strict return precautions.      Alfredo Martinez, MD [facility]

## 2023-07-21 LAB — C-REACTIVE PROTEIN: CRP: 8 mg/L (ref 0–10)

## 2023-07-21 LAB — MICROALBUMIN / CREATININE URINE RATIO
Creatinine, Urine: 60.2 mg/dL
Microalb/Creat Ratio: 62 mg/g{creat} — ABNORMAL HIGH (ref 0–29)
Microalbumin, Urine: 37.5 ug/mL

## 2023-07-21 LAB — LACTATE DEHYDROGENASE: LDH: 156 [IU]/L (ref 119–226)

## 2023-07-22 ENCOUNTER — Telehealth: Payer: Self-pay

## 2023-07-22 NOTE — Telephone Encounter (Signed)
Patient calls nurse line to follow up on lab results from visit on 07/20/23.  She was concerns about ESR level. Advised that per note from Dr. Jena Gauss that this level was slightly elevated but not concerning.   Patient appreciative of call and plans to keep scheduled follow up on 08/18/23.  Veronda Prude, RN

## 2023-07-27 ENCOUNTER — Encounter: Payer: Self-pay | Admitting: Student

## 2023-08-17 ENCOUNTER — Ambulatory Visit (INDEPENDENT_AMBULATORY_CARE_PROVIDER_SITE_OTHER): Payer: Medicare Other | Admitting: Student

## 2023-08-17 ENCOUNTER — Encounter: Payer: Self-pay | Admitting: Student

## 2023-08-17 VITALS — BP 139/66 | HR 71 | Ht 68.0 in | Wt 270.2 lb

## 2023-08-17 DIAGNOSIS — I10 Essential (primary) hypertension: Secondary | ICD-10-CM

## 2023-08-17 DIAGNOSIS — R1084 Generalized abdominal pain: Secondary | ICD-10-CM

## 2023-08-17 DIAGNOSIS — M8000XD Age-related osteoporosis with current pathological fracture, unspecified site, subsequent encounter for fracture with routine healing: Secondary | ICD-10-CM

## 2023-08-17 DIAGNOSIS — I2699 Other pulmonary embolism without acute cor pulmonale: Secondary | ICD-10-CM

## 2023-08-17 DIAGNOSIS — E119 Type 2 diabetes mellitus without complications: Secondary | ICD-10-CM

## 2023-08-17 MED ORDER — ALENDRONATE SODIUM 70 MG PO TABS: ORAL_TABLET | ORAL | 3 refills | Status: DC

## 2023-08-17 MED ORDER — POLYETHYLENE GLYCOL 3350 17 GM/SCOOP PO POWD: 17.0000 g | Freq: Every day | ORAL | 0 refills | Status: AC | PRN

## 2023-08-17 MED ORDER — AMLODIPINE BESYLATE 5 MG PO TABS: 5.0000 mg | ORAL_TABLET | Freq: Every day | ORAL | 2 refills | Status: AC

## 2023-08-17 MED ORDER — APIXABAN 5 MG PO TABS: 5.0000 mg | ORAL_TABLET | Freq: Two times a day (BID) | ORAL | 3 refills | Status: DC

## 2023-08-17 MED ORDER — METOPROLOL TARTRATE 25 MG PO TABS: 25.0000 mg | ORAL_TABLET | Freq: Two times a day (BID) | ORAL | 2 refills | Status: DC

## 2023-08-17 MED ORDER — LOSARTAN POTASSIUM 100 MG PO TABS: 100.0000 mg | ORAL_TABLET | Freq: Every day | ORAL | 1 refills | Status: DC

## 2023-08-17 MED ORDER — ATORVASTATIN CALCIUM 40 MG PO TABS: 40.0000 mg | ORAL_TABLET | Freq: Every day | ORAL | 2 refills | Status: DC

## 2023-08-17 MED ORDER — ONDANSETRON 4 MG PO TBDP: 4.0000 mg | ORAL_TABLET | Freq: Three times a day (TID) | ORAL | 0 refills | Status: AC | PRN

## 2023-08-17 NOTE — Progress Notes (Signed)
    SUBJECTIVE:   CHIEF COMPLAINT / HPI:   Abdominal Pain - Since start of holidays  - Has seen GI in the past  - Similar to previous episodes  -Per last GI note, she had diverticulosis and constipation and offered a MiraLAX  purge for the constipation to the patient.  Additionally offered to continue with Tylenol  and to follow-up with orthopedist for referred right lower back and right lower quadrant abdominal pain that seem to be musculoskeletal. -Patient denies any current hematochezia or hematemesis, notes some mild nausea -Per last note, she has had weight loss that has been ongoing over several months -No fever or chills -Patient reports improvement  She is asking for refills on her medications.    PERTINENT  PMH / PSH: History of rheumatoid arthritis, pulmonary emboli, morbid obesity, coronary artery disease, hypertension, hyperlipidemia, chronic kidney disease, type 2 diabetes, history of pituitary mass, osteoporosis, DCIS of the right breast, anemia, GERD, vertigo   OBJECTIVE:   BP 139/66   Pulse 71   Ht 5' 8 (1.727 m)   Wt 270 lb 4 oz (122.6 kg)   BMI 41.09 kg/m   General: Alert and oriented in no apparent distress Heart: Regular rate and rhythm with no murmurs appreciated Lungs: CTA bilaterally, no wheezing Abdomen: Bowel sounds present, no abdominal pain, NTTP, nondistended, soft  Skin: Warm and dry   ASSESSMENT/PLAN:    Assessment & Plan Generalized abdominal pain Patient has followed with GI in the past.  Recommend continuing with MiraLAX  for abdominal pain.  Patient reports that she feels this related to her diverticulosis.  No evidence of infectious symptoms so no concern for diverticulitis at this time.  Patient has no red flags or acute abdominal exam findings.  This is overall reassuring.  Patient has had total hysterectomy in the past and has no urinary symptoms.  Will have her follow-up with GI if the symptoms persist but overall she is improving.  CT  abdomen pelvis from 10/2022 unrevealing.  Strict return precautions.  Antiemetic ordered for patient upon request. HYPERTENSION, BENIGN ESSENTIAL BP: 139/66 today. Well controlled.  Other acute pulmonary embolism without acute cor pulmonale (HCC) Eliquis  refilled  Age-related osteoporosis with current pathological fracture with routine healing, subsequent encounter On fosamax , last DEXA scan in media-recommended follow up in 2 years for repeat to assess osteopenia. Continue fosamax  for now.     Laurier Hugger, MD [facility]

## 2023-08-17 NOTE — Patient Instructions (Addendum)
 It was great to see you today! Thank you for choosing [facility] for your primary care.  Today we addressed: Continue with liquid diet  Miralax  daily as needed for constipation -- 1 scoop in 8 ounces of water   If symptoms worsen, call [facility] to set up another appt [phone] I have refilled medications as well   If you haven't already, sign up for My Chart to have easy access to your labs results, and communication with your primary care physician.   Please arrive 15 minutes before your appointment to ensure smooth check in process.  We appreciate your efforts in making this happen.  Thank you for allowing me to participate in your care, Laurier Hugger, MD 08/17/2023, 3:36 PM PGY-3, [facility]

## 2023-08-18 ENCOUNTER — Ambulatory Visit: Payer: Medicare Other | Admitting: Student

## 2023-08-18 LAB — COMPREHENSIVE METABOLIC PANEL
ALT: 11 [IU]/L (ref 0–32)
AST: 20 [IU]/L (ref 0–40)
Albumin: 3.8 g/dL (ref 3.8–4.8)
Alkaline Phosphatase: 66 [IU]/L (ref 44–121)
BUN/Creatinine Ratio: 12 (ref 12–28)
BUN: 17 mg/dL (ref 8–27)
Bilirubin Total: 0.5 mg/dL (ref 0.0–1.2)
CO2: 21 mmol/L (ref 20–29)
Calcium: 9.1 mg/dL (ref 8.7–10.3)
Chloride: 104 mmol/L (ref 96–106)
Creatinine, Ser: 1.44 mg/dL — ABNORMAL HIGH (ref 0.57–1.00)
Globulin, Total: 3.5 g/dL (ref 1.5–4.5)
Glucose: 128 mg/dL — ABNORMAL HIGH (ref 70–99)
Potassium: 4.1 mmol/L (ref 3.5–5.2)
Sodium: 139 mmol/L (ref 134–144)
Total Protein: 7.3 g/dL (ref 6.0–8.5)
eGFR: 38 mL/min/{1.73_m2} — ABNORMAL LOW (ref >59)

## 2023-08-19 NOTE — Assessment & Plan Note (Signed)
 Patient has followed with GI in the past.  Recommend continuing with MiraLAX  for abdominal pain.  Patient reports that she feels this related to her diverticulosis.  No evidence of infectious symptoms so no concern for diverticulitis at this time.  Patient has no red flags or acute abdominal exam findings.  This is overall reassuring.  Patient has had total hysterectomy in the past and has no urinary symptoms.  Will have her follow-up with GI if the symptoms persist but overall she is improving.  CT abdomen pelvis from 10/2022 unrevealing.  Strict return precautions.  Antiemetic ordered for patient upon request.

## 2023-08-19 NOTE — Assessment & Plan Note (Signed)
 BP: 139/66 today. Well controlled.

## 2023-08-19 NOTE — Assessment & Plan Note (Signed)
 On fosamax, last DEXA scan in media-recommended follow up in 2 years for repeat to assess osteopenia. Continue fosamax for now.

## 2023-08-19 NOTE — Assessment & Plan Note (Signed)
 Eliquis refilled.

## 2023-09-01 ENCOUNTER — Other Ambulatory Visit: Payer: Self-pay | Admitting: Hematology and Oncology

## 2023-09-11 ENCOUNTER — Ambulatory Visit: Payer: Medicare Other | Admitting: Physician Assistant

## 2023-09-15 ENCOUNTER — Ambulatory Visit: Payer: Medicare Other | Admitting: Podiatry

## 2023-09-15 DIAGNOSIS — E1122 Type 2 diabetes mellitus with diabetic chronic kidney disease: Secondary | ICD-10-CM | POA: Diagnosis not present

## 2023-09-15 DIAGNOSIS — E119 Type 2 diabetes mellitus without complications: Secondary | ICD-10-CM | POA: Diagnosis not present

## 2023-09-15 DIAGNOSIS — N1832 Chronic kidney disease, stage 3b: Secondary | ICD-10-CM | POA: Diagnosis not present

## 2023-09-15 DIAGNOSIS — R809 Proteinuria, unspecified: Secondary | ICD-10-CM | POA: Diagnosis not present

## 2023-09-15 DIAGNOSIS — D649 Anemia, unspecified: Secondary | ICD-10-CM | POA: Diagnosis not present

## 2023-09-15 DIAGNOSIS — N2581 Secondary hyperparathyroidism of renal origin: Secondary | ICD-10-CM | POA: Diagnosis not present

## 2023-09-15 DIAGNOSIS — I129 Hypertensive chronic kidney disease with stage 1 through stage 4 chronic kidney disease, or unspecified chronic kidney disease: Secondary | ICD-10-CM | POA: Diagnosis not present

## 2023-09-28 ENCOUNTER — Encounter: Payer: Medicare Other | Admitting: Internal Medicine

## 2023-10-22 ENCOUNTER — Ambulatory Visit: Admitting: Podiatry

## 2023-10-22 ENCOUNTER — Ambulatory Visit (INDEPENDENT_AMBULATORY_CARE_PROVIDER_SITE_OTHER)

## 2023-10-22 ENCOUNTER — Encounter: Payer: Self-pay | Admitting: Podiatry

## 2023-10-22 DIAGNOSIS — M722 Plantar fascial fibromatosis: Secondary | ICD-10-CM

## 2023-10-22 DIAGNOSIS — M7661 Achilles tendinitis, right leg: Secondary | ICD-10-CM | POA: Diagnosis not present

## 2023-10-22 MED ORDER — TRIAMCINOLONE ACETONIDE 10 MG/ML IJ SUSP
10.0000 mg | Freq: Once | INTRAMUSCULAR | Status: AC
  Administered 2023-10-22: 10 mg via INTRA_ARTICULAR

## 2023-10-22 NOTE — Progress Notes (Signed)
 Subjective:   Patient ID: Erin Coffey, female   DOB: 78 y.o.   MRN: [id number]   HPI Patient states she has severe pain in the back of the right heel for the last few months this been very hard to wear shoe gear with that she knows that she is going to need some kind of boot.  States that it has been very bothersome for   ROS      Objective:  Physical Exam  Neurovascular status intact acute inflammation posterior lateral aspect right heel at the insertional point tendon calcaneus fluid buildup no central medial involvement currently     Assessment:  Acute Achilles tendinitis right inflammation fluid around the posterior lateral aspect     Plan:  H&P reviewed I did do careful steroid injection after explaining risk of rupture 3 mg dexamethasone Kenalog 5 mg Xylocaine and applied air fracture walker with all instructions on usage.  Fitted properly to her lower leg necessary due to the fact we put medicine in in order to immobilize  X-rays do indicate spur formation no indication of stress fracture detachment arthritis

## 2023-10-23 ENCOUNTER — Other Ambulatory Visit: Payer: Self-pay | Admitting: Student

## 2023-10-23 DIAGNOSIS — M545 Low back pain, unspecified: Secondary | ICD-10-CM

## 2023-10-27 ENCOUNTER — Other Ambulatory Visit: Payer: Self-pay | Admitting: Neurosurgery

## 2023-10-27 DIAGNOSIS — D497 Neoplasm of unspecified behavior of endocrine glands and other parts of nervous system: Secondary | ICD-10-CM

## 2023-11-10 ENCOUNTER — Ambulatory Visit
Admission: RE | Admit: 2023-11-10 | Discharge: 2023-11-10 | Disposition: A | Discharge status: Home / Self Care | Source: Ambulatory Visit | Attending: Neurosurgery | Admitting: Neurosurgery

## 2023-11-10 DIAGNOSIS — D352 Benign neoplasm of pituitary gland: Secondary | ICD-10-CM | POA: Diagnosis not present

## 2023-11-10 DIAGNOSIS — D497 Neoplasm of unspecified behavior of endocrine glands and other parts of nervous system: Secondary | ICD-10-CM

## 2023-11-10 MED ORDER — GADOPICLENOL 0.5 MMOL/ML IV SOLN
10.0000 mL | Freq: Once | INTRAVENOUS | Status: AC | PRN
  Administered 2023-11-10: 10 mL via INTRAVENOUS

## 2023-11-20 ENCOUNTER — Telehealth: Payer: Self-pay | Admitting: Adult Health

## 2023-11-20 NOTE — Telephone Encounter (Signed)
 Left vm for pt about rescheduled appt

## 2023-11-26 ENCOUNTER — Encounter: Payer: Self-pay | Admitting: Podiatry

## 2023-11-26 ENCOUNTER — Ambulatory Visit: Admitting: Podiatry

## 2023-11-26 DIAGNOSIS — M7661 Achilles tendinitis, right leg: Secondary | ICD-10-CM | POA: Diagnosis not present

## 2023-11-26 NOTE — Progress Notes (Signed)
 Subjective:   Patient ID: Erin Coffey, female   DOB: 78 y.o.   MRN: [id number]   HPI Patient presents stating that she is improved still having mild discomfort but the boot has been very helpful   ROS      Objective:  Physical Exam  Neurovascular status intact with patient's posterior right heel improved still has an area of discomfort lateral side but better than previous     Assessment:  Doing better with having had acute Achilles tendinitis right     Plan:  H&P reviewed we will continue boot for 2 more weeks gradual reduction over that time ice therapy heel lift therapy and stretch.  Patient will be seen back as symptoms indicate hopefully this will be the end of the problem

## 2023-12-01 ENCOUNTER — Other Ambulatory Visit: Payer: Self-pay | Admitting: Student

## 2023-12-02 ENCOUNTER — Other Ambulatory Visit: Payer: Self-pay | Admitting: Hematology and Oncology

## 2023-12-03 DIAGNOSIS — D497 Neoplasm of unspecified behavior of endocrine glands and other parts of nervous system: Secondary | ICD-10-CM | POA: Diagnosis not present

## 2023-12-09 ENCOUNTER — Telehealth: Payer: Self-pay | Admitting: Adult Health

## 2023-12-09 NOTE — Telephone Encounter (Signed)
 Left patient a vm regarding upcoming appointment

## 2023-12-17 ENCOUNTER — Ambulatory Visit: Payer: Medicare Other | Admitting: Adult Health

## 2023-12-18 ENCOUNTER — Ambulatory Visit: Admitting: Adult Health

## 2023-12-21 ENCOUNTER — Ambulatory Visit: Admitting: Podiatry

## 2023-12-21 ENCOUNTER — Encounter: Payer: Self-pay | Admitting: Podiatry

## 2023-12-21 DIAGNOSIS — R21 Rash and other nonspecific skin eruption: Secondary | ICD-10-CM | POA: Diagnosis not present

## 2023-12-21 DIAGNOSIS — D689 Coagulation defect, unspecified: Secondary | ICD-10-CM | POA: Diagnosis not present

## 2023-12-21 DIAGNOSIS — L84 Corns and callosities: Secondary | ICD-10-CM

## 2023-12-21 MED ORDER — KETOCONAZOLE 2 % EX CREA: TOPICAL_CREAM | Freq: Every day | CUTANEOUS | Status: DC

## 2023-12-22 ENCOUNTER — Telehealth: Payer: Self-pay | Admitting: Podiatry

## 2023-12-22 NOTE — Telephone Encounter (Signed)
 Hello, Patient contacted office in regards to prescription for ketoconazole  sent to [facility] at [employer].Patient states that pharmacy is telling her they have not received the prescription yet. Patient was wanting us  to resend again to [place] at [facility]. Thanks

## 2023-12-23 ENCOUNTER — Telehealth: Payer: Self-pay | Admitting: Podiatry

## 2023-12-23 NOTE — Telephone Encounter (Signed)
 Pt called in because foot is itching, burning and needs Ketoconazole  script sen to [facility].[phone]

## 2023-12-24 ENCOUNTER — Other Ambulatory Visit: Payer: Self-pay | Admitting: Podiatry

## 2023-12-24 DIAGNOSIS — B353 Tinea pedis: Secondary | ICD-10-CM

## 2023-12-24 MED ORDER — KETOCONAZOLE 2 % EX CREA: TOPICAL_CREAM | CUTANEOUS | 1 refills | Status: AC

## 2023-12-24 NOTE — Progress Notes (Signed)
 Subjective:   Patient ID: Erin Coffey, female   DOB: 78 y.o.   MRN: [id number]   HPI Patient presents with 2 different distinct problems with 1 being discoloration between the digits 4 and 5 of both feet with itching mechanism with history of treatment with cream that she no longer has and a painful keratotic lesion plantar left fifth metatarsal head that is hard to walk on with patient also on blood   ROS      Objective:  Physical Exam  Neurovascular status found to be intact no changes in health history with lesion plantar left that is painful when pressed with lucent core and white discoloration localized between the 4th and 5th toes     Assessment:  Oral keratotic lesion left painful when pressed with patient at high risk due to being on blood thinner and rash and probable fungal infection of the fourth interspace both feet     Plan:  H&P both conditions discussed specifically and independently debrided lesion plantar left no iatrogenic bleeding and for the other area we will start her back on Nizoral  cream that she has done well in the past and patient will be seen back as needed may require different cream for treatment

## 2023-12-29 ENCOUNTER — Ambulatory Visit: Admitting: Adult Health

## 2024-01-07 ENCOUNTER — Encounter: Payer: Self-pay | Admitting: Adult Health

## 2024-01-07 ENCOUNTER — Inpatient Hospital Stay: Attending: Adult Health | Admitting: Adult Health

## 2024-01-07 ENCOUNTER — Telehealth: Payer: Self-pay | Admitting: *Deleted

## 2024-01-07 VITALS — BP 176/48 | HR 62 | Temp 98.2°F | Resp 17 | Wt 266.7 lb

## 2024-01-07 DIAGNOSIS — I2510 Atherosclerotic heart disease of native coronary artery without angina pectoris: Secondary | ICD-10-CM | POA: Diagnosis not present

## 2024-01-07 DIAGNOSIS — Z833 Family history of diabetes mellitus: Secondary | ICD-10-CM | POA: Diagnosis not present

## 2024-01-07 DIAGNOSIS — Z803 Family history of malignant neoplasm of breast: Secondary | ICD-10-CM | POA: Diagnosis not present

## 2024-01-07 DIAGNOSIS — Z86718 Personal history of other venous thrombosis and embolism: Secondary | ICD-10-CM | POA: Diagnosis not present

## 2024-01-07 DIAGNOSIS — N1831 Chronic kidney disease, stage 3a: Secondary | ICD-10-CM | POA: Diagnosis not present

## 2024-01-07 DIAGNOSIS — Z841 Family history of disorders of kidney and ureter: Secondary | ICD-10-CM | POA: Insufficient documentation

## 2024-01-07 DIAGNOSIS — D0511 Intraductal carcinoma in situ of right breast: Secondary | ICD-10-CM | POA: Insufficient documentation

## 2024-01-07 DIAGNOSIS — Z8249 Family history of ischemic heart disease and other diseases of the circulatory system: Secondary | ICD-10-CM | POA: Insufficient documentation

## 2024-01-07 DIAGNOSIS — M25512 Pain in left shoulder: Secondary | ICD-10-CM | POA: Diagnosis not present

## 2024-01-07 DIAGNOSIS — Z79811 Long term (current) use of aromatase inhibitors: Secondary | ICD-10-CM | POA: Diagnosis not present

## 2024-01-07 DIAGNOSIS — Z860101 Personal history of adenomatous and serrated colon polyps: Secondary | ICD-10-CM | POA: Insufficient documentation

## 2024-01-07 DIAGNOSIS — Z888 Allergy status to other drugs, medicaments and biological substances status: Secondary | ICD-10-CM | POA: Diagnosis not present

## 2024-01-07 DIAGNOSIS — Z7901 Long term (current) use of anticoagulants: Secondary | ICD-10-CM | POA: Diagnosis not present

## 2024-01-07 DIAGNOSIS — M8580 Other specified disorders of bone density and structure, unspecified site: Secondary | ICD-10-CM | POA: Insufficient documentation

## 2024-01-07 DIAGNOSIS — I129 Hypertensive chronic kidney disease with stage 1 through stage 4 chronic kidney disease, or unspecified chronic kidney disease: Secondary | ICD-10-CM | POA: Insufficient documentation

## 2024-01-07 DIAGNOSIS — Z86018 Personal history of other benign neoplasm: Secondary | ICD-10-CM | POA: Diagnosis not present

## 2024-01-07 DIAGNOSIS — N6489 Other specified disorders of breast: Secondary | ICD-10-CM | POA: Diagnosis not present

## 2024-01-07 DIAGNOSIS — Z806 Family history of leukemia: Secondary | ICD-10-CM | POA: Diagnosis not present

## 2024-01-07 DIAGNOSIS — Z90710 Acquired absence of both cervix and uterus: Secondary | ICD-10-CM | POA: Diagnosis not present

## 2024-01-07 DIAGNOSIS — Z8379 Family history of other diseases of the digestive system: Secondary | ICD-10-CM | POA: Diagnosis not present

## 2024-01-07 DIAGNOSIS — Z86711 Personal history of pulmonary embolism: Secondary | ICD-10-CM | POA: Insufficient documentation

## 2024-01-07 DIAGNOSIS — E1122 Type 2 diabetes mellitus with diabetic chronic kidney disease: Secondary | ICD-10-CM | POA: Insufficient documentation

## 2024-01-07 DIAGNOSIS — Z79899 Other long term (current) drug therapy: Secondary | ICD-10-CM | POA: Insufficient documentation

## 2024-01-07 DIAGNOSIS — M069 Rheumatoid arthritis, unspecified: Secondary | ICD-10-CM | POA: Diagnosis not present

## 2024-01-07 NOTE — Assessment & Plan Note (Signed)
 Hubert Madden is a 78 year old woman with history of right breast DCIS diagnosed in July 2023 status postlumpectomy, adjuvant radiation, and antiestrogen therapy with anastrozole  which began in November 2023.  Assessment and Plan Assessment & Plan Breast cancer, right breast No signs of recurrence. Seroma from previous surgery is smaller. - Order diagnostic 3D mammogram for both breasts when due 01/2024. - Schedule follow-up in 6 months. - Continue anastrozole  therapy. - Instructed her to report any new symptoms or changes immediately.  Osteopenia Mild osteopenia with T-score of -1.6. Tolerates alendronate  well and engages in regular walking. - Continue alendronate  (Fosamax ) weekly. - Continue calcium  and vitamin D  supplementation. - Repeat bone density testing in June 2026.  RTC in 6 months for f/u with Dr. Arno Bibles.

## 2024-01-07 NOTE — Telephone Encounter (Signed)
 Mammo orders faxed to [facility]. Received receipt of confirmation.

## 2024-01-07 NOTE — Progress Notes (Signed)
 [facility] Cancer Follow up:    Erin Headland, MD [address] [place] [address]   DIAGNOSIS:  Cancer Staging  Ductal carcinoma in situ (DCIS) of right breast Staging form: Breast, AJCC 8th Edition - Clinical stage from 03/05/2022: Stage 0 (cTis (DCIS), cN0, cM0, ER+, PR+, HER2: Not Assessed) - Unsigned Stage prefix: Initial diagnosis Nuclear grade: G1 Laterality: Right Staged by: Pathologist and managing physician Stage used in treatment planning: Yes National guidelines used in treatment planning: Yes    SUMMARY OF ONCOLOGIC HISTORY: Oncology History  Ductal carcinoma in situ (DCIS) of right breast  01/20/2022 Mammogram   Digital screening mammogram with incomplete evaluation.  Right breast focal asymmetry indeterminate. Diagnostic mammogram and ultrasound showed no correlate.  Stereotactic biopsy was recommended   02/14/2022 Pathology Results   Pathology from the right breast needle core biopsy showed DCIS, low to intermediate nuclear grade, cribriform and micropapillary types without necrosis and foci suspicious for microinvasion.  Negative for microcalcifications.  DCIS measured 2.2 mm in greatest extent   03/03/2022 Initial Diagnosis   Ductal carcinoma in situ (DCIS) of right breast   03/25/2022 Surgery   She had a right breast lumpectomy which showed DCIS abutting the posterior margin of resection, additional breast tissue from posterior margin negative for DCIS.  No invasive carcinoma.  Prognostics from prior biopsy showed 100% ER positive and 100% PR positive.   04/30/2022 - 05/27/2022 Radiation Therapy   Site Technique Total Dose (Gy) Dose per Fx (Gy) Completed Fx Beam Energies  Breast, Right: Breast_R 3D 42.56/42.56 2.66 16/16 15X, 10XFFF  Breast, Right: Breast_R_Bst 3D 8/8 2 4/4 10X     06/2022 -  Anti-estrogen oral therapy   Anastrozole      CURRENT THERAPY:  INTERVAL HISTORY:  Discussed the use of AI scribe software for clinical note  transcription with the patient, who gave verbal consent to proceed.  History of Present Illness Erin Coffey is a 78 year old female with breast cancer who presents for follow-up regarding her breast health and medication management.  She underwent surgery on her right breast for breast cancer. Recent imaging, including a mammogram and ultrasound in , shows fluid in the right breast, which varies with her position. She is awaiting a bilateral mammogram appointment next month at [facility]. There are no new symptoms or changes in her breast aside from the fluid sensation.  She is on anastrozole , which she tolerates well. She also takes Fosamax  (alendronate ) weekly for osteopenia, with a T-score of -1.6, and supplements with calcium  and vitamin D . Her last calcium  level was normal in January.     Patient Active Problem List   Diagnosis Date Noted   Numbness and tingling 04/27/2023   Rheumatoid arthritis involving right hand (HCC) 03/23/2023   Right hand pain 03/23/2023   Hand tingling 03/23/2023   Family history of breast cancer 03/05/2022   Ductal carcinoma in situ (DCIS) of right breast 03/03/2022   Fatigue 09/04/2021   Closed compression fracture of first lumbar vertebra (HCC) 04/09/2021   BPPV (benign paroxysmal positional vertigo), left 04/04/2021   Peripheral vertigo 01/15/2021   Pituitary mass (HCC) 12/31/2020   Left anterior knee pain 12/13/2020   Chronic kidney disease, stage 3a (HCC) 12/13/2020   Abdominal pain 04/30/2020   Body mass index (BMI) 40.0-44.9, adult (HCC) 01/26/2020   Mild coronary artery disease 10/2019   History of spinal fracture 03/24/2019   Age-related osteoporosis with current pathological fracture 03/24/2019   Nontraumatic complete tear  of left rotator cuff 03/24/2019   Primary hypercoagulable state (HCC) 03/07/2019   Osteoporotic compression fracture of spine (HCC) 03/07/2019   Pulmonary emboli (HCC) 03/03/2019   Chronic left hip pain 08/17/2018    Left sided numbness 07/22/2018   Low back pain potentially associated with radiculopathy 01/29/2018   Diverticulosis of colon 03/24/2017   Hypertensive retinopathy of right eye 01/20/2017   Hypermetropia of both eyes 01/20/2017   Chronic kidney disease 04/29/2016   Anemia 04/29/2016   Rotator cuff dysfunction 06/25/2015   Long term (current) use of anticoagulants 05/08/2011   Hyperlipidemia associated with type 2 diabetes mellitus (HCC) 08/08/2008   Type 2 diabetes mellitus with hemoglobin A1c goal of less than 7.5% (HCC) 06/05/2008   HYPERTENSION, BENIGN ESSENTIAL 11/27/2006   Morbid (severe) obesity due to excess calories (HCC) 10/08/2006   GASTROESOPHAGEAL REFLUX, NO ESOPHAGITIS 10/08/2006   Osteoarthritis, multiple sites 10/08/2006    is allergic to lisinopril.  MEDICAL HISTORY: Past Medical History:  Diagnosis Date   Allergy    Anemia, iron  deficiency    ANXIETY, SITUATIONAL 04/06/2009   Qualifier: Diagnosis of  By: Joni Net  MD, Stephanie     Arthritis    Back pain, thoracic 12/20/2012   Carpal tunnel syndrome 12/07/2014   Cholelithiasis 06/25/2012   Chronic kidney disease    DM type 2 goal A1C below 7.5 06/05/2008   Qualifier: Diagnosis of  By: Joni Net  MD, Trevor Fudge     Family history of breast cancer 03/05/2022   GASTROESOPHAGEAL REFLUX, NO ESOPHAGITIS 10/08/2006   Qualifier: Diagnosis of  By: Dee Farber, unspecified 10/21/2007   Qualifier: Diagnosis of  By: Scotty Cyphers MD, [name]     History of DVT (deep vein thrombosis) 05/05/2013   on Eliquis    HYPERLIPIDEMIA 08/08/2008   Qualifier: Diagnosis of  By: Joni Net  MD, Stephanie     HYPERTENSION, BENIGN ESSENTIAL 11/27/2006   Qualifier: Diagnosis of  ByScotty Cyphers MD, [name]     Mild coronary artery disease 10/2019   Coronary CT Angiogram: Coronary calcium  score 46.Normal coronary origin.  Left dominance with tortuous coronary arteries.  Nondominant RCA - prox < 25% followed by ectasia (dilated to 5.5 mm) mixed plaque in  distal vessel.  LM- normal . LAD: minimal mixed plaque in ostial & proximal (<25%) with brief IM bridging in mLAD. Mild mid-distal (25-49%), RI - prox <25%, Large LCx- minimal (<25%) mid-dista   Mild coronary artery disease    Personal history of colonic polyps 10/24/2009   Qualifier: Diagnosis of  By: Arvie Latus MD, Moishe Angel    [place] 08/08/2009   Qualifier: Diagnosis of  By: [place], [name]     Pituitary adenoma [facility])    Polymyalgia rheumatica (HCC) 10/02/2009   Qualifier: Diagnosis of  By: Moses Arenas MD, Kayleen Party     Pulmonary embolism [facility]) - On long Term anticoagulation (Z79.01) 04/28/2011   Converted from warfarin to Eliquis ; on long-term anticoagulation.   Thyroid  disease    Trigger ring finger of left hand 11/02/2014    SURGICAL HISTORY: Past Surgical History:  Procedure Laterality Date   ABDOMINAL HYSTERECTOMY     BREAST LUMPECTOMY WITH RADIOACTIVE SEED LOCALIZATION Right 03/25/2022   Procedure: RIGHT BREAST SEED LUMPECTOMY;  Surgeon: Sim Dryer, MD;  Location: [facility];  Service: General;  Laterality: Right;   CARDIAC CATHETERIZATION  04/29/2011   (Dr. Judy Null - [place]): LM - normal. LAD with 1 major Diag - normal, bifurcating Ramus - normal, Large (6-7 mm) Dominant LCx  with 1 OM, 2 LPL branches & LPDA - non significant disease; small non-dominant RCA.     CARDIAC EVENT MONITOR  09/2019   Mostly sinus rhythm, average rate 60 bpm.  Range 45 to 190 bpm.  No critical events indicating any arrhythmias or pauses.  For patient responses noted PVCs.  Less than 1% PVCs noted.    CARPAL TUNNEL RELEASE Right 1996   COLONOSCOPY     KNEE ARTHROSCOPY  Bilateral   masctomy     TRANSTHORACIC ECHOCARDIOGRAM  09/2019   Normal EF - 60 to 65%.  Mild LVH.  GR 1 DD.  Normal RV.  Trivial MR.  Normal IVC.  Normal PA pressures.   TUBAL LIGATION     UPPER GASTROINTESTINAL ENDOSCOPY      SOCIAL HISTORY: Social History   Socioeconomic History   Marital status: Widowed    Spouse  name: Not on file   Number of children: 3   Years of education: 10   Highest education level: 10th grade  Occupational History   Occupation: Retired- Multimedia programmer: [employer]  Tobacco Use   Smoking status: Never    Passive exposure: Never   Smokeless tobacco: Never  Vaping Use   Vaping status: Never Used  Substance and Sexual Activity   Alcohol use: No    Alcohol/week: 0.0 standard drinks of alcohol   Drug use: No   Sexual activity: Not Currently  Other Topics Concern   Not on file  Social History Narrative   Patient lives alone in [place], she is a widow.   Patient has 3 children. Two daughters live locally, one son in [place] .    Patient is very active in her church and volunteer work to stay busy.    Patient enjoys cooking, spending time with family, and crossword puzzles.       Social Drivers of Corporate investment banker Strain: Low Risk  (05/01/2023)   Overall Financial Resource Strain (CARDIA)    Difficulty of Paying Living Expenses: Not hard at all  Food Insecurity: No Food Insecurity (05/01/2023)   Hunger Vital Sign    Worried About Running Out of Food in the Last Year: Never true    Ran Out of Food in the Last Year: Never true  Transportation Needs: No Transportation Needs (05/01/2023)   PRAPARE - Administrator, Civil Service (Medical): No    Lack of Transportation (Non-Medical): No  Physical Activity: Insufficiently Active (05/01/2023)   Exercise Vital Sign    Days of Exercise per Week: 3 days    Minutes of Exercise per Session: 30 min  Stress: No Stress Concern Present (05/01/2023)   [employer] of Occupational Health - Occupational Stress Questionnaire    Feeling of Stress : Not at all  Social Connections: Moderately Integrated (05/01/2023)   Social Connection and Isolation Panel [NHANES]    Frequency of Communication with Friends and Family: More than three times a week    Frequency of Social Gatherings with Friends and Family:  Three times a week    Attends Religious Services: More than 4 times per year    Active Member of Clubs or Organizations: Yes    Attends Banker Meetings: More than 4 times per year    Marital Status: Widowed  Intimate Partner Violence: Not At Risk (05/01/2023)   Humiliation, Afraid, Rape, and Kick questionnaire    Fear of Current or Ex-Partner: No    Emotionally Abused: No  Physically Abused: No    Sexually Abused: No    FAMILY HISTORY: Family History  Problem Relation Age of Onset   Diabetes Mother    Heart disease Mother    Hypertension Mother    Heart disease Father    Hypertension Father    Diabetes Sister    Heart disease Sister    Leukemia Sister 59   Cirrhosis Sister        alcohol   Kidney disease Sister    Diabetes Sister    Hypertension Brother    Diabetes Brother    Breast cancer Cousin        dx 92s; maternal female cousin   Colon cancer Neg Hx    Pancreatic cancer Neg Hx    Esophageal cancer Neg Hx     Review of Systems  Constitutional:  Negative for appetite change, chills, fatigue, fever and unexpected weight change.  HENT:   Negative for hearing loss, lump/mass and trouble swallowing.   Eyes:  Negative for eye problems and icterus.  Respiratory:  Negative for chest tightness, cough and shortness of breath.   Cardiovascular:  Negative for chest pain, leg swelling and palpitations.  Gastrointestinal:  Negative for abdominal distention, abdominal pain, constipation, diarrhea, nausea and vomiting.  Endocrine: Negative for hot flashes.  Genitourinary:  Negative for difficulty urinating.   Musculoskeletal:  Negative for arthralgias.  Skin:  Negative for itching and rash.  Neurological:  Negative for dizziness, extremity weakness, headaches and numbness.  Hematological:  Negative for adenopathy. Does not bruise/bleed easily.  Psychiatric/Behavioral:  Negative for depression. The patient is not nervous/anxious.       PHYSICAL  EXAMINATION    Vitals:   01/07/24 0849 01/07/24 0920  BP: (!) 178/70 (!) 176/48  Pulse:    Resp:    Temp:    SpO2:      Physical Exam Constitutional:      General: She is not in acute distress.    Appearance: Normal appearance. She is not toxic-appearing.  HENT:     Head: Normocephalic and atraumatic.     Mouth/Throat:     Mouth: Mucous membranes are moist.     Pharynx: Oropharynx is clear. No oropharyngeal exudate or posterior oropharyngeal erythema.  Eyes:     General: No scleral icterus. Cardiovascular:     Rate and Rhythm: Normal rate and regular rhythm.     Pulses: Normal pulses.     Heart sounds: Normal heart sounds.  Pulmonary:     Effort: Pulmonary effort is normal.     Breath sounds: Normal breath sounds.  Chest:     Comments: Right breast s/p lumpectomy and radiation, small seroma noted at lumpectomy site, no sign of local recurrence, left breast is benign Abdominal:     General: Abdomen is flat. Bowel sounds are normal. There is no distension.     Palpations: Abdomen is soft.     Tenderness: There is no abdominal tenderness.  Musculoskeletal:        General: No swelling.     Cervical back: Neck supple.  Lymphadenopathy:     Cervical: No cervical adenopathy.     Upper Body:     Right upper body: No supraclavicular or axillary adenopathy.     Left upper body: No supraclavicular or axillary adenopathy.  Skin:    General: Skin is warm and dry.     Findings: No rash.  Neurological:     General: No focal deficit present.     Mental  Status: She is alert.  Psychiatric:        Mood and Affect: Mood normal.        Behavior: Behavior normal.     LABORATORY DATA:  CBC    Component Value Date/Time   WBC 4.7 06/22/2023 1435   WBC 4.2 10/17/2022 1219   RBC 3.66 (L) 06/22/2023 1435   RBC 3.63 (L) 10/17/2022 1219   HGB 10.9 (L) 06/22/2023 1435   HCT 33.4 (L) 06/22/2023 1435   PLT 217 06/22/2023 1435   MCV 91 06/22/2023 1435   MCH 29.8 06/22/2023 1435    MCH 29.8 10/17/2022 1219   MCHC 32.6 06/22/2023 1435   MCHC 32.3 10/17/2022 1219   RDW 14.5 06/22/2023 1435   LYMPHSABS 1.6 10/17/2022 1219   LYMPHSABS 2.5 04/30/2020 1505   MONOABS 0.6 10/17/2022 1219   EOSABS 0.2 10/17/2022 1219   EOSABS 0.3 04/30/2020 1505   BASOSABS 0.0 10/17/2022 1219   BASOSABS 0.0 04/30/2020 1505    CMP     Component Value Date/Time   NA 139 08/17/2023 1654   K 4.1 08/17/2023 1654   CL 104 08/17/2023 1654   CO2 21 08/17/2023 1654   GLUCOSE 128 (H) 08/17/2023 1654   GLUCOSE 88 10/17/2022 1219   BUN 17 08/17/2023 1654   CREATININE 1.44 (H) 08/17/2023 1654   CREATININE 1.38 (H) 03/05/2022 0806   CREATININE 1.22 (H) 04/29/2016 0914   CALCIUM  9.1 08/17/2023 1654   PROT 7.3 08/17/2023 1654   ALBUMIN 3.8 08/17/2023 1654   AST 20 08/17/2023 1654   AST 22 03/05/2022 0806   ALT 11 08/17/2023 1654   ALT 14 03/05/2022 0806   ALKPHOS 66 08/17/2023 1654   BILITOT 0.5 08/17/2023 1654   BILITOT 0.6 03/05/2022 0806   GFRNONAA 47 (L) 10/17/2022 1219   GFRNONAA 40 (L) 03/05/2022 0806   GFRNONAA 46 (L) 04/29/2016 0914   GFRAA 46 (L) 04/30/2020 1505   GFRAA 53 (L) 04/29/2016 0914     ASSESSMENT and THERAPY PLAN:   Ductal carcinoma in situ (DCIS) of right breast Hubert Madden is a 78 year old woman with history of right breast DCIS diagnosed in July 2023 status postlumpectomy, adjuvant radiation, and antiestrogen therapy with anastrozole  which began in November 2023.  Assessment and Plan Assessment & Plan Breast cancer, right breast No signs of recurrence. Seroma from previous surgery is smaller. - Order diagnostic 3D mammogram for both breasts when due 01/2024. - Schedule follow-up in 6 months. - Continue anastrozole  therapy. - Instructed her to report any new symptoms or changes immediately.  Osteopenia Mild osteopenia with T-score of -1.6. Tolerates alendronate  well and engages in regular walking. - Continue alendronate  (Fosamax ) weekly. - Continue calcium   and vitamin D  supplementation. - Repeat bone density testing in June 2026.  RTC in 6 months for f/u with Dr. Arno Bibles.    All questions were answered. The patient knows to call the clinic with any problems, questions or concerns. We can certainly see the patient much sooner if necessary.  Total encounter time:20 minutes*in face-to-face visit time, chart review, lab review, care coordination, order entry, and documentation of the encounter time.    Alwin Baars, NP 01/07/24 9:21 AM Medical Oncology and Hematology [facility] [address] [place], [place] [address] Tel. [phone]    Fax. [phone]  *Total Encounter Time as defined by the Centers for Medicare and Medicaid Services includes, in addition to the face-to-face time of a patient visit (documented in the note above) non-face-to-face time: obtaining  and reviewing outside history, ordering and reviewing medications, tests or procedures, care coordination (communications with other health care professionals or caregivers) and documentation in the medical record.

## 2024-01-19 ENCOUNTER — Encounter: Payer: Self-pay | Admitting: *Deleted

## 2024-01-25 ENCOUNTER — Other Ambulatory Visit: Payer: Self-pay

## 2024-01-26 MED ORDER — ALENDRONATE SODIUM 70 MG PO TABS: ORAL_TABLET | ORAL | 0 refills | Status: DC

## 2024-01-29 ENCOUNTER — Ambulatory Visit (INDEPENDENT_AMBULATORY_CARE_PROVIDER_SITE_OTHER): Admitting: Student

## 2024-01-29 ENCOUNTER — Encounter: Payer: Self-pay | Admitting: Student

## 2024-01-29 VITALS — BP 129/59 | HR 57 | Ht 68.0 in | Wt 267.0 lb

## 2024-01-29 DIAGNOSIS — I10 Essential (primary) hypertension: Secondary | ICD-10-CM

## 2024-01-29 DIAGNOSIS — E119 Type 2 diabetes mellitus without complications: Secondary | ICD-10-CM | POA: Diagnosis not present

## 2024-01-29 DIAGNOSIS — R252 Cramp and spasm: Secondary | ICD-10-CM | POA: Diagnosis not present

## 2024-01-29 LAB — POCT GLYCOSYLATED HEMOGLOBIN (HGB A1C): HbA1c, POC (controlled diabetic range): 5.8 % (ref 0.0–7.0)

## 2024-01-29 MED ORDER — SHINGRIX 50 MCG/0.5ML IM SUSR: 0.5000 mL | INTRAMUSCULAR | 0 refills | Status: AC

## 2024-01-29 MED ORDER — BACLOFEN 10 MG PO TABS
10.0000 mg | ORAL_TABLET | Freq: Three times a day (TID) | ORAL | 0 refills | Status: AC
Start: 2024-01-29 — End: ?

## 2024-01-29 MED ORDER — ALENDRONATE SODIUM 70 MG PO TABS: ORAL_TABLET | ORAL | 0 refills | Status: DC

## 2024-01-29 NOTE — Progress Notes (Cosign Needed Addendum)
    SUBJECTIVE:   CHIEF COMPLAINT / HPI:   Type 2 Diabetes: Home medications include: None. Does endorse compliance with diet. Patient is up to date on diabetic eye. Most recent A1Cs:  Lab Results  Component Value Date   HGBA1C 5.8 01/29/2024   HGBA1C 5.8 06/22/2023   Last Microalbumin, LDL, Creatinine: Lab Results  Component Value Date   LDLCALC 80 06/22/2023   CREATININE 1.44 (H) 08/17/2023    Muscle cramping occurs primarily in her thighs and hands, mainly at night, and has been present for the past two weeks. The cramps occur even when sitting and are occasionally accompanied by weakness. She has experienced similar intermittent cramping episodes in the past. She has been out of baclofen , which she used as needed, typically at night.  She is not on any medication for diabetes and has not been since 2017, with her last A1c at 5.8. Current medications include amlodipine , Eliquis , Lipitor, an iron  supplement, and vitamins C and D. She reports decreased appetite and weight loss, attributed to eating less and walking for exercise, primarily indoors due to the heat.   PERTINENT  PMH / PSH: History of rheumatoid arthritis, pulmonary emboli, morbid obesity, coronary artery disease, hypertension, hyperlipidemia, chronic kidney disease, type 2 diabetes, history of pituitary mass, osteoporosis, DCIS of the right breast, anemia, GERD, vertigo   OBJECTIVE:   BP (!) 129/59   Pulse (!) 57   Ht 5' 8 (1.727 m)   Wt 267 lb (121.1 kg)   SpO2 100%   BMI 40.60 kg/m   General: Alert and oriented in no apparent distress Heart: Regular rate and rhythm with no murmurs appreciated Lungs: CTA bilaterally, no wheezing Abdomen: Bowel sounds present, no abdominal pain Skin: Warm and dry Extremities: No lower extremity edema Diabetic Foot Exam - Simple   Simple Foot Form Visual Inspection No deformities, no ulcerations, no other skin breakdown bilaterally: Yes Sensation Testing Intact to touch  and monofilament testing bilaterally: Yes Pulse Check Posterior Tibialis and Dorsalis pulse intact bilaterally: Yes Comments      ASSESSMENT/PLAN:   Assessment & Plan Type 2 diabetes mellitus with hemoglobin A1c goal of less than 7.5% (HCC) Well controlled with dietary changes. Foot exam performed  Muscle cramping Check labs to ensure no organic cause. Baclofen  refilled on request, takes only intermittently for muscle pains. Return in 4 weeks. Keep an eye on symptoms.    Refill fosamax , ordered shingrix second dose  Ernestina Headland, MD [facility]

## 2024-01-29 NOTE — Assessment & Plan Note (Signed)
 Well controlled with dietary changes. Foot exam performed

## 2024-01-29 NOTE — Patient Instructions (Signed)
 It was great to see you today! Thank you for choosing [facility] for your primary care.  Today we addressed: A1C looks great!   We will check some labs for muscle cramping  Take Baclofen  up to three times a day as needed   We will check cholesterol as well   If you haven't already, sign up for My Chart to have easy access to your labs results, and communication with your primary care physician.   Please arrive 15 minutes before your appointment to ensure smooth check in process.  We appreciate your efforts in making this happen.  Thank you for allowing me to participate in your care, Ernestina Headland, MD 01/29/2024, 1:58 PM PGY-3, [facility]

## 2024-01-30 LAB — LIPID PANEL
Chol/HDL Ratio: 2.2 ratio (ref 0.0–4.4)
Cholesterol, Total: 165 mg/dL (ref 100–199)
HDL: 74 mg/dL (ref >39)
LDL Chol Calc (NIH): 80 mg/dL (ref 0–99)
Triglycerides: 53 mg/dL (ref 0–149)
VLDL Cholesterol Cal: 11 mg/dL (ref 5–40)

## 2024-01-30 LAB — CBC WITH DIFFERENTIAL/PLATELET
Basophils Absolute: 0 10*3/uL (ref 0.0–0.2)
Basos: 1 %
EOS (ABSOLUTE): 0.2 10*3/uL (ref 0.0–0.4)
Eos: 6 %
Hematocrit: 31.5 % — ABNORMAL LOW (ref 34.0–46.6)
Hemoglobin: 10.3 g/dL — ABNORMAL LOW (ref 11.1–15.9)
Immature Grans (Abs): 0 10*3/uL (ref 0.0–0.1)
Immature Granulocytes: 0 %
Lymphocytes Absolute: 1.6 10*3/uL (ref 0.7–3.1)
Lymphs: 40 %
MCH: 30.6 pg (ref 26.6–33.0)
MCHC: 32.7 g/dL (ref 31.5–35.7)
MCV: 94 fL (ref 79–97)
Monocytes Absolute: 0.5 10*3/uL (ref 0.1–0.9)
Monocytes: 11 %
Neutrophils Absolute: 1.8 10*3/uL (ref 1.4–7.0)
Neutrophils: 42 %
Platelets: 189 10*3/uL (ref 150–450)
RBC: 3.37 x10E6/uL — ABNORMAL LOW (ref 3.77–5.28)
RDW: 14.1 % (ref 11.7–15.4)
WBC: 4.1 10*3/uL (ref 3.4–10.8)

## 2024-01-30 LAB — COMPREHENSIVE METABOLIC PANEL WITH GFR
ALT: 12 IU/L (ref 0–32)
AST: 23 IU/L (ref 0–40)
Albumin: 3.7 g/dL — ABNORMAL LOW (ref 3.8–4.8)
Alkaline Phosphatase: 66 IU/L (ref 44–121)
BUN/Creatinine Ratio: 14 (ref 12–28)
BUN: 21 mg/dL (ref 8–27)
Bilirubin Total: 0.4 mg/dL (ref 0.0–1.2)
CO2: 20 mmol/L (ref 20–29)
Calcium: 9 mg/dL (ref 8.7–10.3)
Chloride: 103 mmol/L (ref 96–106)
Creatinine, Ser: 1.53 mg/dL — ABNORMAL HIGH (ref 0.57–1.00)
Globulin, Total: 3.5 g/dL (ref 1.5–4.5)
Glucose: 115 mg/dL — ABNORMAL HIGH (ref 70–99)
Potassium: 4.3 mmol/L (ref 3.5–5.2)
Sodium: 136 mmol/L (ref 134–144)
Total Protein: 7.2 g/dL (ref 6.0–8.5)
eGFR: 35 mL/min/{1.73_m2} — ABNORMAL LOW (ref >59)

## 2024-01-30 LAB — MAGNESIUM: Magnesium: 2 mg/dL (ref 1.6–2.3)

## 2024-01-30 LAB — TSH RFX ON ABNORMAL TO FREE T4: TSH: 1.23 u[IU]/mL (ref 0.450–4.500)

## 2024-01-30 LAB — CK: Total CK: 159 U/L (ref 32–182)

## 2024-02-01 ENCOUNTER — Ambulatory Visit: Payer: Self-pay | Admitting: Student

## 2024-02-01 DIAGNOSIS — Z853 Personal history of malignant neoplasm of breast: Secondary | ICD-10-CM | POA: Diagnosis not present

## 2024-02-01 DIAGNOSIS — Z08 Encounter for follow-up examination after completed treatment for malignant neoplasm: Secondary | ICD-10-CM | POA: Diagnosis not present

## 2024-02-03 ENCOUNTER — Encounter: Payer: Self-pay | Admitting: Adult Health

## 2024-02-17 ENCOUNTER — Other Ambulatory Visit: Payer: Self-pay | Admitting: Hematology and Oncology

## 2024-02-25 ENCOUNTER — Other Ambulatory Visit: Payer: Self-pay

## 2024-02-25 ENCOUNTER — Emergency Department (HOSPITAL_COMMUNITY)

## 2024-02-25 ENCOUNTER — Emergency Department (HOSPITAL_COMMUNITY)
Admission: EM | Admit: 2024-02-25 | Discharge: 2024-02-25 | Disposition: A | Discharge status: Home / Self Care | Attending: Emergency Medicine | Admitting: Emergency Medicine

## 2024-02-25 ENCOUNTER — Encounter (HOSPITAL_COMMUNITY): Payer: Self-pay

## 2024-02-25 DIAGNOSIS — Z043 Encounter for examination and observation following other accident: Secondary | ICD-10-CM | POA: Diagnosis not present

## 2024-02-25 DIAGNOSIS — S20212A Contusion of left front wall of thorax, initial encounter: Secondary | ICD-10-CM | POA: Diagnosis not present

## 2024-02-25 DIAGNOSIS — R001 Bradycardia, unspecified: Secondary | ICD-10-CM | POA: Diagnosis not present

## 2024-02-25 DIAGNOSIS — Z86718 Personal history of other venous thrombosis and embolism: Secondary | ICD-10-CM | POA: Insufficient documentation

## 2024-02-25 DIAGNOSIS — S0990XA Unspecified injury of head, initial encounter: Secondary | ICD-10-CM | POA: Insufficient documentation

## 2024-02-25 DIAGNOSIS — Y9222 Religious institution as the place of occurrence of the external cause: Secondary | ICD-10-CM | POA: Diagnosis not present

## 2024-02-25 DIAGNOSIS — M47812 Spondylosis without myelopathy or radiculopathy, cervical region: Secondary | ICD-10-CM | POA: Diagnosis not present

## 2024-02-25 DIAGNOSIS — W01198A Fall on same level from slipping, tripping and stumbling with subsequent striking against other object, initial encounter: Secondary | ICD-10-CM | POA: Insufficient documentation

## 2024-02-25 DIAGNOSIS — M5020 Other cervical disc displacement, unspecified cervical region: Secondary | ICD-10-CM | POA: Diagnosis not present

## 2024-02-25 DIAGNOSIS — R079 Chest pain, unspecified: Secondary | ICD-10-CM | POA: Diagnosis not present

## 2024-02-25 DIAGNOSIS — W19XXXA Unspecified fall, initial encounter: Secondary | ICD-10-CM

## 2024-02-25 DIAGNOSIS — Z7901 Long term (current) use of anticoagulants: Secondary | ICD-10-CM | POA: Insufficient documentation

## 2024-02-25 DIAGNOSIS — R0789 Other chest pain: Secondary | ICD-10-CM | POA: Diagnosis not present

## 2024-02-25 DIAGNOSIS — R0781 Pleurodynia: Secondary | ICD-10-CM | POA: Diagnosis not present

## 2024-02-25 DIAGNOSIS — D352 Benign neoplasm of pituitary gland: Secondary | ICD-10-CM | POA: Diagnosis not present

## 2024-02-25 DIAGNOSIS — S199XXA Unspecified injury of neck, initial encounter: Secondary | ICD-10-CM | POA: Diagnosis not present

## 2024-02-25 DIAGNOSIS — S299XXA Unspecified injury of thorax, initial encounter: Secondary | ICD-10-CM | POA: Diagnosis present

## 2024-02-25 LAB — CBC WITH DIFFERENTIAL/PLATELET
Abs Immature Granulocytes: 0.02 K/uL (ref 0.00–0.07)
Basophils Absolute: 0 K/uL (ref 0.0–0.1)
Basophils Relative: 1 %
Eosinophils Absolute: 0.2 K/uL (ref 0.0–0.5)
Eosinophils Relative: 4 %
HCT: 32.9 % — ABNORMAL LOW (ref 36.0–46.0)
Hemoglobin: 10.9 g/dL — ABNORMAL LOW (ref 12.0–15.0)
Immature Granulocytes: 1 %
Lymphocytes Relative: 39 %
Lymphs Abs: 1.7 K/uL (ref 0.7–4.0)
MCH: 30.8 pg (ref 26.0–34.0)
MCHC: 33.1 g/dL (ref 30.0–36.0)
MCV: 92.9 fL (ref 80.0–100.0)
Monocytes Absolute: 0.6 K/uL (ref 0.1–1.0)
Monocytes Relative: 14 %
Neutro Abs: 1.8 K/uL (ref 1.7–7.7)
Neutrophils Relative %: 41 %
Platelets: 173 K/uL (ref 150–400)
RBC: 3.54 MIL/uL — ABNORMAL LOW (ref 3.87–5.11)
RDW: 15 % (ref 11.5–15.5)
WBC: 4.3 K/uL (ref 4.0–10.5)
nRBC: 0 % (ref 0.0–0.2)

## 2024-02-25 LAB — BASIC METABOLIC PANEL WITH GFR
Anion gap: 12 (ref 5–15)
BUN: 20 mg/dL (ref 8–23)
CO2: 21 mmol/L — ABNORMAL LOW (ref 22–32)
Calcium: 9.5 mg/dL (ref 8.9–10.3)
Chloride: 104 mmol/L (ref 98–111)
Creatinine, Ser: 1.45 mg/dL — ABNORMAL HIGH (ref 0.44–1.00)
GFR, Estimated: 38 mL/min — ABNORMAL LOW (ref >60)
Glucose, Bld: 99 mg/dL (ref 70–99)
Potassium: 4.1 mmol/L (ref 3.5–5.1)
Sodium: 137 mmol/L (ref 135–145)

## 2024-02-25 MED ORDER — OXYCODONE HCL 5 MG PO TABS: 5.0000 mg | ORAL_TABLET | ORAL | 0 refills | Status: DC | PRN

## 2024-02-25 MED ORDER — ACETAMINOPHEN 500 MG PO TABS
1000.0000 mg | ORAL_TABLET | ORAL | Status: AC
  Administered 2024-02-25: 1000 mg via ORAL
  Filled 2024-02-25: qty 2

## 2024-02-25 MED ORDER — LIDOCAINE 5 % EX PTCH
1.0000 | MEDICATED_PATCH | CUTANEOUS | Status: DC
  Administered 2024-02-25: 1 via TRANSDERMAL
  Filled 2024-02-25: qty 1

## 2024-02-25 MED ORDER — HYDROMORPHONE HCL 1 MG/ML IJ SOLN
0.5000 mg | Freq: Once | INTRAMUSCULAR | Status: AC
  Administered 2024-02-25: 0.5 mg via INTRAVENOUS
  Filled 2024-02-25: qty 1

## 2024-02-25 NOTE — ED Notes (Signed)
 Extra Blue top drawn

## 2024-02-25 NOTE — ED Notes (Signed)
 Patient transported to CT

## 2024-02-25 NOTE — ED Notes (Incomplete)
 Trauma Response Nurse Documentation   Erin Coffey is a 78 y.o. female arriving to [facility] ED via [facility]  On Eliquis  (apixaban ) daily. Trauma was activated as a Level 2 by Charge RN based on the following trauma criteria Elderly patients > 65 with head trauma on anti-coagulation (excluding ASA).  Patient cleared for CT by Dr. Yolande. Pt transported to CT with trauma response nurse present to monitor. RN remained with the patient throughout their absence from the department for clinical observation.   GCS 15.  Trauma Coffey Arrival Time: ***.  History   Past Medical History:  Diagnosis Date   Allergy    Anemia, iron  deficiency    ANXIETY, SITUATIONAL 04/06/2009   Qualifier: Diagnosis of  By: Flint  Coffey, Erin     Arthritis    Back pain, thoracic 12/20/2012   Carpal tunnel syndrome 12/07/2014   Cholelithiasis 06/25/2012   Chronic kidney disease    DM type 2 goal A1C below 7.5 06/05/2008   Qualifier: Diagnosis of  By: Flint  Coffey, Erin     Family history of breast cancer 03/05/2022   GASTROESOPHAGEAL REFLUX, NO ESOPHAGITIS 10/08/2006   Qualifier: Diagnosis of  By: Erin Coffey Saliva, unspecified 10/21/2007   Qualifier: Diagnosis of  By: ROJELIO Coffey, Erin     History of DVT (deep vein thrombosis) 05/05/2013   on Eliquis    HYPERLIPIDEMIA 08/08/2008   Qualifier: Diagnosis of  By: Flint  Coffey, Erin     HYPERTENSION, BENIGN ESSENTIAL 11/27/2006   Qualifier: Diagnosis of  ByBETHA ROJELIO Coffey, Erin     Mild coronary artery disease 10/2019   Coronary CT Angiogram: Coronary calcium  score 46.Normal coronary origin.  Left dominance with tortuous coronary arteries.  Nondominant RCA - prox < 25% followed by ectasia (dilated to 5.5 mm) mixed plaque in distal vessel.  LM- normal . LAD: minimal mixed plaque in ostial & proximal (<25%) with brief IM bridging in mLAD. Mild mid-distal (25-49%), RI - prox <25%, Large LCx- minimal (<25%) mid-dista   Mild coronary artery  disease    Personal history of colonic polyps 10/24/2009   Qualifier: Diagnosis of  By: Erin Coffey, Erin Coffey    Pica 08/08/2009   Qualifier: Diagnosis of  By: Coffey, Erin     Pituitary adenoma [facility])    Polymyalgia rheumatica (HCC) 10/02/2009   Qualifier: Diagnosis of  By: Rickard Coffey, Erin     Pulmonary embolism [facility]) - On long Term anticoagulation (Z79.01) 04/28/2011   Converted from warfarin to Eliquis ; on long-term anticoagulation.   Thyroid  disease    Trigger ring finger of left hand 11/02/2014     Past Surgical History:  Procedure Laterality Date   ABDOMINAL HYSTERECTOMY     BREAST LUMPECTOMY WITH RADIOACTIVE SEED LOCALIZATION Right 03/25/2022   Procedure: RIGHT BREAST SEED LUMPECTOMY;  Surgeon: Erin Ned, Coffey;  Location: [facility];  Service: General;  Laterality: Right;   CARDIAC CATHETERIZATION  04/29/2011   (Dr. Morris - Coffey): LM - normal. LAD with 1 major Diag - normal, bifurcating Ramus - normal, Large (6-7 mm) Dominant LCx with 1 OM, 2 LPL branches & LPDA - non significant disease; small non-dominant RCA.     CARDIAC EVENT MONITOR  09/2019   Mostly sinus rhythm, average rate 60 bpm.  Range 45 to 190 bpm.  No critical events indicating any arrhythmias or pauses.  For patient responses noted PVCs.  Less than 1% PVCs noted.  CARPAL TUNNEL RELEASE Right 1996   COLONOSCOPY     KNEE ARTHROSCOPY  Bilateral   masctomy     TRANSTHORACIC ECHOCARDIOGRAM  09/2019   Normal EF - 60 to 65%.  Mild LVH.  GR 1 DD.  Normal RV.  Trivial MR.  Normal IVC.  Normal PA pressures.   TUBAL LIGATION     UPPER GASTROINTESTINAL ENDOSCOPY         Initial Focused Assessment (If applicable, or please see trauma documentation): Airway - Clear Breathing - unlabored, pain in posterior left ribs Circulation - 2+ peripheral pulses GCS -15  CT's Completed:   CT Head and CT C-Spine   Interventions:  IV Labs Xrays CT scans  Plan for disposition:  {Trauma  Dispo:26867}   Consults completed:  {Trauma Consults:26862} at ***.  Event Summary:  Tripped on flip flop while at church - c/o hitting forehead- no obvious injury but pt states that she feels like her head is bruised, no other obvious injuries, pain on palpation over left posterior rib area. Full sensation to all extremities with full ROM. States that she did not get up and walk after the fall.  Bedside handoff with {Trauma handoff:26863::ED RN} ***.    Erin Coffey  Trauma Response RN  Please call TRN at [phone] for further assistance.

## 2024-02-25 NOTE — ED Notes (Signed)
 Patient ambulated to the restroom with a steady gait.

## 2024-02-25 NOTE — ED Provider Notes (Signed)
 [place] EMERGENCY DEPARTMENT AT [facility] Provider Note   CSN: [id number] Arrival date & time: 02/25/24  9145     Patient presents with: No chief complaint on file.   Erin Coffey is a 78 y.o. female.   78 year old female with a history of DVT on Eliquis  who presents the emergency department after a fall.  Patient reported that she tripped and fell when she was at church and landed on her left side.  Did have head strike.  No LOC.  Has been complaining of her left rib pain since then.  EMS placed her on oxygen for comfort.       Prior to Admission medications   Medication Sig Start Date End Date Taking? Authorizing Provider  oxyCODONE  (ROXICODONE ) 5 MG immediate release tablet Take 1 tablet (5 mg total) by mouth every 4 (four) hours as needed for severe pain (pain score 7-10). 02/25/24  Yes Yolande Lamar BROCKS, MD  acetaminophen  (TYLENOL ) 650 MG CR tablet Take 1,300 mg by mouth every 8 (eight) hours as needed for pain.    [provider]  alendronate  (FOSAMAX ) 70 MG tablet TAKE 1 TABLET BY MOUTH ONCE A WEEK WITH 8 OZ OF PLAIN WATER 30 MINUTES BEFORE FIRST FOOD, DRINK, OR MEDS. STAY UPRIGHT FOR 30 MINUTES 01/29/24   Bryan Bianchi, MD  amLODipine  (NORVASC ) 5 MG tablet Take 1 tablet (5 mg total) by mouth at bedtime. 08/17/23   Bryan Bianchi, MD  anastrozole  (ARIMIDEX ) 1 MG tablet Take 1 tablet by mouth once daily 02/17/24   Causey, Lindsey Cornetto, NP  apixaban  (ELIQUIS ) 5 MG TABS tablet Take 1 tablet (5 mg total) by mouth 2 (two) times daily. 08/17/23   Bryan Bianchi, MD  atorvastatin  (LIPITOR) 40 MG tablet Take 1 tablet (40 mg total) by mouth daily. 08/17/23   Bryan Bianchi, MD  baclofen  (LIORESAL ) 10 MG tablet Take 1 tablet (10 mg total) by mouth 3 (three) times daily. 01/29/24   Bryan Bianchi, MD  CHOLECALCIFEROL PO Take 2,200 Units by mouth daily.    [provider]  diclofenac  Sodium (VOLTAREN ) 1 % GEL Apply 2 g topically 4 (four) times daily as  needed (pain). 03/23/23   Bryan Bianchi, MD  Ferrous Sulfate (IRON  PO) Take 25 mg by mouth. Gentle Iron  from [employer]    [provider]  fish oil-omega-3 fatty acids 1000 MG capsule Take 1 g by mouth daily.    [provider]  ketoconazole  (NIZORAL ) 2 % cream Apply to both feet once daily for 4 weeks. 12/24/23   Magdalen Pasco RAMAN, DPM  losartan  (COZAAR ) 100 MG tablet Take 1 tablet (100 mg total) by mouth daily. 08/17/23 02/13/24  Bryan Bianchi, MD  metoprolol  tartrate (LOPRESSOR ) 25 MG tablet Take 1 tablet (25 mg total) by mouth 2 (two) times daily. 08/17/23   Bryan Bianchi, MD  omeprazole  (PRILOSEC) 20 MG capsule Take 1 capsule (20 mg total) by mouth daily. 05/03/21   Palumbo, April, MD  ondansetron  (ZOFRAN -ODT) 4 MG disintegrating tablet Take 1 tablet (4 mg total) by mouth every 8 (eight) hours as needed for nausea or vomiting. 08/17/23   Bryan Bianchi, MD  polyethylene glycol powder (MIRALAX ) 17 GM/SCOOP powder Take 17 g by mouth daily as needed (constipation). 08/17/23   Bryan Bianchi, MD  traMADol  (ULTRAM ) 50 MG tablet TAKE 1 TABLET BY MOUTH EVERY 6 HOURS AS NEEDED 10/26/23   Bryan Bianchi, MD  vitamin C (ASCORBIC ACID) 500 MG tablet Take 500 mg by  mouth daily.    [provider]    Allergies: Lisinopril    Review of Systems  Updated Vital Signs BP (!) 159/53   Pulse (!) 56   Temp 98.2 F (36.8 C) (Oral)   Resp (!) 24   Ht 5' 8 (1.727 m)   Wt 125.2 kg   SpO2 100%   BMI 41.97 kg/m   Physical Exam Constitutional:      General: She is not in acute distress.    Appearance: Normal appearance. She is not ill-appearing.  HENT:     Head: Normocephalic and atraumatic.     Right Ear: External ear normal.     Left Ear: External ear normal.     Mouth/Throat:     Mouth: Mucous membranes are moist.     Pharynx: Oropharynx is clear.  Eyes:     Extraocular Movements: Extraocular movements intact.     Conjunctiva/sclera: Conjunctivae normal.     Pupils:  Pupils are equal, round, and reactive to light.     Comments: Pupils 3 mm and reactive bilaterally  Neck:     Comments: No C-spine midline tenderness to palpation Cardiovascular:     Rate and Rhythm: Normal rate and regular rhythm.     Pulses: Normal pulses.     Heart sounds: Normal heart sounds.     Comments: Radial and DP pulses 2+ bilaterally Pulmonary:     Effort: Pulmonary effort is normal. No respiratory distress.     Breath sounds: Normal breath sounds.     Comments: Left rib tenderness palpation Abdominal:     General: Abdomen is flat. There is no distension.     Palpations: Abdomen is soft. There is no mass.     Tenderness: There is no abdominal tenderness. There is no guarding.  Musculoskeletal:        General: No deformity. Normal range of motion.     Cervical back: No rigidity or tenderness.     Comments: No tenderness to palpation of midline thoracic or lumbar spine.  No step-offs palpated.  No tenderness to palpation of bilateral clavicles.  No tenderness to palpation, bruising, or deformities noted of bilateral shoulders, elbows, wrists, hips, knees, or ankles.  Neurological:     General: No focal deficit present.     Mental Status: She is alert and oriented to person, place, and time. Mental status is at baseline.     Cranial Nerves: No cranial nerve deficit.     Sensory: No sensory deficit.     Motor: No weakness.     (all labs ordered are listed, but only abnormal results are displayed) Labs Reviewed  BASIC METABOLIC PANEL WITH GFR - Abnormal; Notable for the following components:      Result Value   CO2 21 (*)    Creatinine, Ser 1.45 (*)    GFR, Estimated 38 (*)    All other components within normal limits  CBC WITH DIFFERENTIAL/PLATELET - Abnormal; Notable for the following components:   RBC 3.54 (*)    Hemoglobin 10.9 (*)    HCT 32.9 (*)    All other components within normal limits    EKG: EKG Interpretation Date/Time:  Thursday February 25 2024  10:23:59 EDT Ventricular Rate:  48 PR Interval:  179 QRS Duration:  95 QT Interval:  468 QTC Calculation: 419 R Axis:   37  Text Interpretation: Sinus bradycardia Low voltage, precordial leads Confirmed by Yolande Charleston [phone]) on 02/25/2024 10:40:21 AM  Radiology: ARCOLA Ribs Unilateral  W/Chest Left Result Date: 02/25/2024 CLINICAL DATA:  L rib pain EXAM: LEFT RIBS AND CHEST - 3+ VIEW COMPARISON:  May 03, 2021 FINDINGS: No focal airspace consolidation, pleural effusion, or pneumothorax. No cardiomegaly. No acute, displaced rib fracture or destructive lesion. Multilevel thoracic osteophytosis. IMPRESSION: No acute, displaced rib fracture. Otherwise, clear lungs. Electronically Signed   By: Rogelia Myers M.D.   On: 02/25/2024 10:46   CT Head Wo Contrast Result Date: 02/25/2024 CLINICAL DATA:  78 year old female status post fall on blood thinners. Known pituitary tumor. EXAM: CT HEAD WITHOUT CONTRAST TECHNIQUE: Contiguous axial images were obtained from the base of the skull through the vertex without intravenous contrast. RADIATION DOSE REDUCTION: This exam was performed according to the departmental dose-optimization program which includes automated exposure control, adjustment of the mA and/or kV according to patient size and/or use of iterative reconstruction technique. COMPARISON:  Brain MRI 11/10/2023.  Head CT 05/03/2021. FINDINGS: Brain: Known pituitary intra sellar mass with suprasellar extension, now up to 1.7 cm craniocaudal (stable from April MRI, perhaps 1 mm larger since 2022). Associated mild suprasellar mass effect appears stable. No midline shift. No other intracranial mass effect. No ventriculomegaly. Background brain volume is within normal limits for age. No acute intracranial hemorrhage identified. No cortically based acute infarct identified. Gray-white differentiation is within normal limits for age. Vascular: Calcified atherosclerosis at the skull base. No suspicious  intracranial vascular hyperdensity. Skull: Congenital incomplete ossification of the posterior C1 ring, normal variant. No acute osseous abnormality identified. Sinuses/Orbits: Visualized paranasal sinuses and mastoids are stable and well aerated. Other: No orbit or scalp soft tissue injury identified. IMPRESSION: 1. No acute intracranial abnormality or acute traumatic injury identified. 2. Chronic pituitary adenoma stable from April MRI. Electronically Signed   By: VEAR Hurst M.D.   On: 02/25/2024 10:41   CT Cervical Spine Wo Contrast Result Date: 02/25/2024 CLINICAL DATA:  Fall.  On blood thinners. EXAM: CT CERVICAL SPINE WITHOUT CONTRAST TECHNIQUE: Multidetector CT imaging of the cervical spine was performed without intravenous contrast. Multiplanar CT image reconstructions were also generated. RADIATION DOSE REDUCTION: This exam was performed according to the departmental dose-optimization program which includes automated exposure control, adjustment of the mA and/or kV according to patient size and/or use of iterative reconstruction technique. COMPARISON:  Cervical MRI 03/18/2019. FINDINGS: Alignment: Normal. Skull base and vertebrae: No evidence of acute fracture or traumatic subluxation. Incomplete posterior arch of C1, normal variant. Soft tissues and spinal canal: No prevertebral fluid or swelling. No visible canal hematoma. Disc levels: Mild multilevel spondylosis with scattered small disc protrusions, similar to previous MRI. No large disc herniation or high-grade foraminal narrowing demonstrated. Upper chest: Clear lung apices. Other: None. IMPRESSION: 1. No evidence of acute cervical spine fracture, traumatic subluxation or static signs of instability. 2. Mild multilevel spondylosis. Electronically Signed   By: Elsie Perone M.D.   On: 02/25/2024 10:14     Procedures   Medications Ordered in the ED  lidocaine  (LIDODERM ) 5 % 1 patch (1 patch Transdermal Patch Applied 02/25/24 1040)   HYDROmorphone  (DILAUDID ) injection 0.5 mg (0.5 mg Intravenous Given 02/25/24 0912)  acetaminophen  (TYLENOL ) tablet 1,000 mg (1,000 mg Oral Given 02/25/24 0912)                                    Medical Decision Making Amount and/or Complexity of Data Reviewed Labs: ordered. Radiology: ordered.  Risk OTC drugs. Prescription drug management.  Erin Coffey is a 78 y.o. female with comorbidities that complicate the patient evaluation including DVT on Eliquis  after a fall   Initial Ddx:  TBI, concussion, c-spine injury, Bruised rib, rib fracture, pneumonia, pneumothorax  MDM:  Patient presents to the emergency department after mechanical fall.  Did hit her head.  Is on Eliquis .  Also is having some left-sided rib pain from the fall.  On exam does appear to be uncomfortable and is on 2 L nasal cannula for comfort.  Concerned about possible bruised rib or rib fractures will obtain x-rays at this time.  No symptoms of concussion at this point in time.  Given her age and blood thinner use will obtain a CT of the head and C-spine.  Plan:  CT head CT C-spine  Chest x-ray Lidocaine  patch P.o. pain medication  ED Summary/Re-evaluation:  Patient had x-rays which showed that they had no broken ribs.  Was able to be weaned to room air.  CT head and C-spine unremarkable.  No pneumothorax or pneumonia.  Pain under control here in the emergency department.  Given an incentive spirometer and instructed to follow-up with your primary doctor in several days.  This patient presents to the ED for concern of complaints listed in HPI, this involves an extensive number of treatment options, and is a complaint that carries with it a high risk of complications and morbidity. Disposition including potential need for admission considered.   Dispo: DC Home. Return precautions discussed including, but not limited to, those listed in the AVS. Allowed pt time to ask questions which were answered fully  prior to dc.  Additional history obtained from EMS Records reviewed Outpatient Clinic Notes The following labs were independently interpreted: Chemistry and show no acute abnormality I independently reviewed the following imaging with scope of interpretation limited to determining acute life threatening conditions related to emergency care: CT Head and agree with the radiologist interpretation with the following exceptions: none I personally reviewed and interpreted cardiac monitoring: sinus bradycardia I personally reviewed and interpreted the pt's EKG: see above for interpretation  I have reviewed the patients home medications and made adjustments as needed Social Determinants of health:  Geriatric   Final diagnoses:  Fall, initial encounter  Minor head injury, initial encounter  Bruised ribs, left, initial encounter    ED Discharge Orders          Ordered    oxyCODONE  (ROXICODONE ) 5 MG immediate release tablet  Every 4 hours PRN        02/25/24 1050               Yolande Lamar BROCKS, MD 02/25/24 1137

## 2024-02-25 NOTE — Discharge Instructions (Addendum)
 You were seen for your fall and rib pain in the emergency department.  It is likely that you have a bruised rib.  At home, please take Tylenol  and use lidocaine  patches we have prescribed you for your pain.  Please also use the incentive spirometer we have given you to prevent any pneumonias. You may also take the oxycodone  we have prescribed you for any breakthrough pain that may have.  Do not take this before driving or operating heavy machinery.  Do not take this medication with alcohol.  Check your MyChart online for the results of any tests that had not resulted by the time you left the emergency department.   Follow-up with your primary doctor in 2-3 days regarding your visit.    Return immediately to the emergency department if you experience any of the following: Difficulty breathing, fever, severe pain, or any other concerning symptoms.    Thank you for visiting our Emergency Department. It was a pleasure taking care of you today.

## 2024-02-25 NOTE — Progress Notes (Signed)
 Orthopedic Tech Progress Note Patient Details:  Erin Coffey October 28, 1945 [id number] Level 2 Trauma Patient ID: Erin Coffey, female   DOB: 04-02-1946, 78 y.o.   MRN: [id number]  Erin Coffey 02/25/2024, 9:03 AM

## 2024-02-25 NOTE — ED Triage Notes (Signed)
 Pt bib ems from church; had a mechanical fall, hit head, L temple; pt on Eliquis ; c/o L rib pain; worse with breathing; VSS; co some sob; 98% 2L, cbg 89, HR 70, RR 18; 132/72; GCS 15

## 2024-03-01 ENCOUNTER — Ambulatory Visit (INDEPENDENT_AMBULATORY_CARE_PROVIDER_SITE_OTHER)

## 2024-03-01 VITALS — BP 131/65 | HR 61 | Ht 68.0 in | Wt 270.9 lb

## 2024-03-01 DIAGNOSIS — W010XXA Fall on same level from slipping, tripping and stumbling without subsequent striking against object, initial encounter: Secondary | ICD-10-CM

## 2024-03-01 DIAGNOSIS — I10 Essential (primary) hypertension: Secondary | ICD-10-CM

## 2024-03-01 NOTE — Progress Notes (Signed)
     SUBJECTIVE:   CHIEF COMPLAINT / HPI:   Erin Coffey presents today for hospital follow up.   She was evaluated at the [facility] ED on 02/25/2024 for a mechanical fall and discharged the same day. The patient is on daily Eliquis . She tripped over her feet and fell at church, landing on her L side and hitting her head on the ground. There was no LOC.   CXR results 02/25/2024:No acute, displaced rib fracture. Otherwise, clear lungs. CT head results 02/25/2024:1. No acute intracranial abnormality or acute traumatic injury identified. 2. Chronic pituitary adenoma stable from April MRI. CT c-spine results 02/25/2024:1. No evidence of acute cervical spine fracture, traumatic subluxation or static signs of instability. 2. Mild multilevel spondylosis.  Since discharge, patient reports she is still sore on the L side of her chest. She was prescribed oxycodone  from the ED but she did not pick it up from the pharmacy because she has taken it before and it makes her heart rate slow down and she doesn't feel good. She had 50mg  tramadol  at home that she has been taking as needed for pain at bedtime which relieves pain, otherwise she sticks to 1300mg  of Tylenol . She describes the pain as a soreness and worse with cough or sneeze. She has chronic L shoulder and arm pain but this is not worse than normal. She denies vision changes, extremity numbness/weakness, dizziness, or any other complaints.   PERTINENT  PMH / PSH: DVT on Eliquis , L rotator cuff tear  OBJECTIVE:   BP 131/65   Pulse 61   Ht 5' 8 (1.727 m)   Wt 270 lb 14.2 oz (122.9 kg)   SpO2 100%   BMI 41.19 kg/m    General: A&O, NAD, pleasant HEENT: No sign of trauma, EOM grossly intact, PERRLA, very mild TTP to the L lateral supraorbital ridge of the frontal bone without ecchymosis/abrasions or other overlying skin changes, no dental damage, no subconjunctival hemorrhage, no TTP of the scalp, no overlying skin changes of the scalp,  no damage to the L ear, no facial or neck swelling or tenderness, no paraspinal cervical tenderness, no midline cervical tenderness, no bony step offs Cardiac: RRR, no m/r/g Respiratory: CTAB, normal WOB, no w/c/r GI: Soft, NTTP, non-distended  Extremities: NTTP, no peripheral edema, no L shoulder or arm edema or overlying skin changes, ecchymosis, abrasions, limited ROM of the shoulder secondary to rotator cuff tear (no different than normal per patient) Neuro: Normal gait, moves all four extremities appropriately, equal smile, CN II-XII intact, clear, full speech, sensation intact Psych: Appropriate mood and affect   ASSESSMENT/PLAN:   Assessment & Plan Fall on same level from tripping Occurred at church 6 days ago. Patient is feeling much better however still has some L sided chest soreness. Taking all medications as prescribed. Discussed ED precautions. Return to clinic as needed. -Continue taking your prescribed tramadol  as needed for pain.  Essential hypertension BP initially elevated, may be related to pain. Improved with recheck. -Continue current regimen. -F/U with PCP as usual.   Camie Dixons, DO [facility]

## 2024-03-01 NOTE — Patient Instructions (Addendum)
 It was wonderful to see you today.  Please bring ALL of your medications with you to every visit.   Today we talked about:  Your fall at church which you were seen at [facility] ED for on 02/25/2024. We are so glad you are doing better! Continue to take your tramadol  and tylenol  as needed for pain. Return to the ED for red flag symptoms such as chest pain or SOB. Continue to take your medications as prescribed. See us  back in the clinic as needed.   Thank you for choosing [facility].   Please call [phone] with any questions about today's appointment.  Please arrive at least 15 minutes prior to your scheduled appointments.   If you had blood work today, I will send you a MyChart message or a letter if results are normal. Otherwise, I will give you a call.   If you had a referral placed, they will call you to set up an appointment. Please give us  a call if you don't hear back in the next 2 weeks.   If you need additional refills before your next appointment, please call your pharmacy first.   You should follow up in our clinic as needed  Camie Dixons, DO Family Medicine

## 2024-03-07 ENCOUNTER — Emergency Department (HOSPITAL_COMMUNITY)

## 2024-03-07 ENCOUNTER — Encounter (HOSPITAL_COMMUNITY): Payer: Self-pay

## 2024-03-07 ENCOUNTER — Emergency Department (HOSPITAL_COMMUNITY)
Admission: EM | Admit: 2024-03-07 | Discharge: 2024-03-07 | Disposition: A | Discharge status: Home / Self Care | Attending: Emergency Medicine | Admitting: Emergency Medicine

## 2024-03-07 ENCOUNTER — Other Ambulatory Visit: Payer: Self-pay

## 2024-03-07 DIAGNOSIS — R1012 Left upper quadrant pain: Secondary | ICD-10-CM | POA: Insufficient documentation

## 2024-03-07 DIAGNOSIS — R1011 Right upper quadrant pain: Secondary | ICD-10-CM | POA: Diagnosis not present

## 2024-03-07 DIAGNOSIS — Z7901 Long term (current) use of anticoagulants: Secondary | ICD-10-CM | POA: Insufficient documentation

## 2024-03-07 DIAGNOSIS — R0602 Shortness of breath: Secondary | ICD-10-CM | POA: Diagnosis not present

## 2024-03-07 DIAGNOSIS — R0789 Other chest pain: Secondary | ICD-10-CM | POA: Diagnosis not present

## 2024-03-07 DIAGNOSIS — I700 Atherosclerosis of aorta: Secondary | ICD-10-CM | POA: Diagnosis not present

## 2024-03-07 LAB — CBC WITH DIFFERENTIAL/PLATELET
Abs Immature Granulocytes: 0.01 K/uL (ref 0.00–0.07)
Basophils Absolute: 0 K/uL (ref 0.0–0.1)
Basophils Relative: 0 %
Eosinophils Absolute: 0.2 K/uL (ref 0.0–0.5)
Eosinophils Relative: 6 %
HCT: 33 % — ABNORMAL LOW (ref 36.0–46.0)
Hemoglobin: 10.6 g/dL — ABNORMAL LOW (ref 12.0–15.0)
Immature Granulocytes: 0 %
Lymphocytes Relative: 36 %
Lymphs Abs: 1.3 K/uL (ref 0.7–4.0)
MCH: 29.9 pg (ref 26.0–34.0)
MCHC: 32.1 g/dL (ref 30.0–36.0)
MCV: 93 fL (ref 80.0–100.0)
Monocytes Absolute: 0.4 K/uL (ref 0.1–1.0)
Monocytes Relative: 12 %
Neutro Abs: 1.7 K/uL (ref 1.7–7.7)
Neutrophils Relative %: 46 %
Platelets: 209 K/uL (ref 150–400)
RBC: 3.55 MIL/uL — ABNORMAL LOW (ref 3.87–5.11)
RDW: 15.4 % (ref 11.5–15.5)
WBC: 3.7 K/uL — ABNORMAL LOW (ref 4.0–10.5)
nRBC: 0 % (ref 0.0–0.2)

## 2024-03-07 LAB — BASIC METABOLIC PANEL WITH GFR
Anion gap: 8 (ref 5–15)
BUN: 18 mg/dL (ref 8–23)
CO2: 21 mmol/L — ABNORMAL LOW (ref 22–32)
Calcium: 9.2 mg/dL (ref 8.9–10.3)
Chloride: 107 mmol/L (ref 98–111)
Creatinine, Ser: 1.26 mg/dL — ABNORMAL HIGH (ref 0.44–1.00)
GFR, Estimated: 44 mL/min — ABNORMAL LOW (ref >60)
Glucose, Bld: 110 mg/dL — ABNORMAL HIGH (ref 70–99)
Potassium: 4.4 mmol/L (ref 3.5–5.1)
Sodium: 136 mmol/L (ref 135–145)

## 2024-03-07 MED ORDER — OXYCODONE HCL 5 MG PO TABS
5.0000 mg | ORAL_TABLET | Freq: Once | ORAL | Status: DC
  Filled 2024-03-07: qty 1

## 2024-03-07 MED ORDER — OXYCODONE HCL 5 MG PO TABS: 5.0000 mg | ORAL_TABLET | Freq: Four times a day (QID) | ORAL | 0 refills | Status: AC | PRN

## 2024-03-07 MED ORDER — ACETAMINOPHEN 325 MG PO TABS
650.0000 mg | ORAL_TABLET | Freq: Once | ORAL | Status: AC
  Administered 2024-03-07: 650 mg via ORAL
  Filled 2024-03-07: qty 2

## 2024-03-07 MED ORDER — IOHEXOL 350 MG/ML SOLN
75.0000 mL | Freq: Once | INTRAVENOUS | Status: AC | PRN
  Administered 2024-03-07: 75 mL via INTRAVENOUS

## 2024-03-07 NOTE — Progress Notes (Signed)
 Chief Complaint: Bladder abnormality on CT  History of Present Illness:  Erin Coffey is a 78 y.o. female who is seen in consultation from [name], DO for evaluation of bladder abnormality seen on recent CT scan.  She presented to the emergency room about 10 days after sustaining a fall at church.  She is on Eliquis .  CT findings: 1. No acute traumatic injury in the chest, abdomen, or pelvis. 2. Mild pericholecystic haziness, nonspecific in the setting of a nondistended gallbladder. Recommend correlation with right upper quadrant pain. 3. New enhancing foci within the anterior right bladder wall measuring up to 9 x 8 mm, suspicious for bladder neoplasm. Recommend urology consultation. 4. Surgical clips in the right breast adjacent to a 4.8 x 3.6 cm fluid collection, which may be postsurgical. Recommend correlation with dedicated breast imaging. 5. Aortic Atherosclerosis (ICD10-I70.0). Coronary artery calcifications. Assessment for potential risk factor modification, dietary therapy or pharmacologic therapy may be warranted, if clinically indicated.  Since that visit, she has had 1 episode of gross painless hematuria.  She does not have history with high risk factors for urothelial carcinoma.  Past Medical History:  Past Medical History:  Diagnosis Date   Allergy    Anemia, iron  deficiency    ANXIETY, SITUATIONAL 04/06/2009   Qualifier: Diagnosis of  By: [place]  MD, Stephanie     Arthritis    Back pain, thoracic 12/20/2012   Carpal tunnel syndrome 12/07/2014   Cholelithiasis 06/25/2012   Chronic kidney disease    DM type 2 goal A1C below 7.5 06/05/2008   Qualifier: Diagnosis of  By: Flint  MD, Corean     Family history of breast cancer 03/05/2022   GASTROESOPHAGEAL REFLUX, NO ESOPHAGITIS 10/08/2006   Qualifier: Diagnosis of  By: [place] Rocky Saliva, unspecified 10/21/2007   Qualifier: Diagnosis of  By: ROJELIO MD, [name]     History of DVT (deep vein  thrombosis) 05/05/2013   on Eliquis    HYPERLIPIDEMIA 08/08/2008   Qualifier: Diagnosis of  By: [place]  MD, Stephanie     HYPERTENSION, BENIGN ESSENTIAL 11/27/2006   Qualifier: Diagnosis of  ByBETHA ROJELIO MD, [name]     Mild coronary artery disease 10/2019   Coronary CT Angiogram: Coronary calcium  score 46.Normal coronary origin.  Left dominance with tortuous coronary arteries.  Nondominant RCA - prox < 25% followed by ectasia (dilated to 5.5 mm) mixed plaque in distal vessel.  LM- normal . LAD: minimal mixed plaque in ostial & proximal (<25%) with brief IM bridging in mLAD. Mild mid-distal (25-49%), RI - prox <25%, Large LCx- minimal (<25%) mid-dista   Mild coronary artery disease    Personal history of colonic polyps 10/24/2009   Qualifier: Diagnosis of  By: Debrah MD, Lamar BIRCH    [place] 08/08/2009   Qualifier: Diagnosis of  By: [place], [name]     Pituitary adenoma [facility])    Polymyalgia rheumatica (HCC) 10/02/2009   Qualifier: Diagnosis of  By: Rickard MD, Vernell     Pulmonary embolism [employer] [facility]) - On long Term anticoagulation (Z79.01) 04/28/2011   Converted from warfarin to Eliquis ; on long-term anticoagulation.   Thyroid  disease    Trigger ring finger of left hand 11/02/2014    Past Surgical History:  Past Surgical History:  Procedure Laterality Date   ABDOMINAL HYSTERECTOMY     BREAST LUMPECTOMY WITH RADIOACTIVE SEED LOCALIZATION Right 03/25/2022   Procedure: RIGHT BREAST SEED LUMPECTOMY;  Surgeon: Vanderbilt Ned, MD;  Location: [facility];  Service: General;  Laterality: Right;   CARDIAC CATHETERIZATION  04/29/2011   (Dr. Morris - [facility]): LM - normal. LAD with 1 major Diag - normal, bifurcating Ramus - normal, Large (6-7 mm) Dominant LCx with 1 OM, 2 LPL branches & LPDA - non significant disease; small non-dominant RCA.     CARDIAC EVENT MONITOR  09/2019   Mostly sinus rhythm, average rate 60 bpm.  Range 45 to 190 bpm.  No critical events indicating any arrhythmias or  pauses.  For patient responses noted PVCs.  Less than 1% PVCs noted.    CARPAL TUNNEL RELEASE Right 1996   COLONOSCOPY     KNEE ARTHROSCOPY  Bilateral   masctomy     TRANSTHORACIC ECHOCARDIOGRAM  09/2019   Normal EF - 60 to 65%.  Mild LVH.  GR 1 DD.  Normal RV.  Trivial MR.  Normal IVC.  Normal PA pressures.   TUBAL LIGATION     UPPER GASTROINTESTINAL ENDOSCOPY      Allergies:  Allergies  Allergen Reactions   Lisinopril Cough    Family History:  Family History  Problem Relation Age of Onset   Diabetes Mother    Heart disease Mother    Hypertension Mother    Heart disease Father    Hypertension Father    Diabetes Sister    Heart disease Sister    Leukemia Sister 65   Cirrhosis Sister        alcohol   Kidney disease Sister    Diabetes Sister    Hypertension Brother    Diabetes Brother    Breast cancer Cousin        dx 14s; maternal female cousin   Colon cancer Neg Hx    Pancreatic cancer Neg Hx    Esophageal cancer Neg Hx     Social History:  Social History   Tobacco Use   Smoking status: Never    Passive exposure: Never   Smokeless tobacco: Never  Vaping Use   Vaping status: Never Used  Substance Use Topics   Alcohol use: No    Alcohol/week: 0.0 standard drinks of alcohol   Drug use: No    Review of symptoms:  Constitutional:  Negative for unexplained weight loss, night sweats, fever, chills ENT:  Negative for nose bleeds, sinus pain, painful swallowing CV:  Negative for chest pain, shortness of breath, exercise intolerance, palpitations, loss of consciousness Resp:  Negative for cough, wheezing, shortness of breath GI:  Negative for nausea, vomiting, diarrhea, bloody stools GU:  Positives noted in HPI; otherwise negative for gross hematuria, dysuria, urinary incontinence Neuro:  Negative for seizures, poor balance, limb weakness, slurred speech Psych:  Negative for lack of energy, depression, anxiety Endocrine:  Negative for polydipsia, polyuria,  symptoms of hypoglycemia (dizziness, hunger, sweating) Hematologic:  Negative for anemia, purpura, petechia, prolonged or excessive bleeding, use of anticoagulants  Allergic:  Negative for difficulty breathing or choking as a result of exposure to anything; no shellfish allergy; no allergic response (rash/itch) to materials, foods  Physical exam: There were no vitals taken for this visit. GENERAL APPEARANCE:  Well appearing, well developed, well nourished, NAD HEENT: Atraumatic, Normocephalic. NECK: Normal appearance LUNGS: Normal inspiratory and expiratory excursion HEART: Regular Rate EXTREMITIES: Moves all extremities well.  Without clubbing, cyanosis, or edema. NEUROLOGIC:  Alert and oriented x 3, normal gait, CN II-XII grossly intact.  MENTAL STATUS:  Appropriate. SKIN:  Warm, dry and intact.    Results: Results for orders placed or performed during  the hospital encounter of 03/07/24 (from the past 24 hours)  CBC with Differential   Collection Time: 03/07/24 10:09 AM  Result Value Ref Range   WBC 3.7 (L) 4.0 - 10.5 K/uL   RBC 3.55 (L) 3.87 - 5.11 MIL/uL   Hemoglobin 10.6 (L) 12.0 - 15.0 g/dL   HCT 66.9 (L) 63.9 - 53.9 %   MCV 93.0 80.0 - 100.0 fL   MCH 29.9 26.0 - 34.0 pg   MCHC 32.1 30.0 - 36.0 g/dL   RDW 84.5 88.4 - 84.4 %   Platelets 209 150 - 400 K/uL   nRBC 0.0 0.0 - 0.2 %   Neutrophils Relative % 46 %   Neutro Abs 1.7 1.7 - 7.7 K/uL   Lymphocytes Relative 36 %   Lymphs Abs 1.3 0.7 - 4.0 K/uL   Monocytes Relative 12 %   Monocytes Absolute 0.4 0.1 - 1.0 K/uL   Eosinophils Relative 6 %   Eosinophils Absolute 0.2 0.0 - 0.5 K/uL   Basophils Relative 0 %   Basophils Absolute 0.0 0.0 - 0.1 K/uL   Immature Granulocytes 0 %   Abs Immature Granulocytes 0.01 0.00 - 0.07 K/uL  Basic metabolic panel   Collection Time: 03/07/24 10:09 AM  Result Value Ref Range   Sodium 136 135 - 145 mmol/L   Potassium 4.4 3.5 - 5.1 mmol/L   Chloride 107 98 - 111 mmol/L   CO2 21 (L) 22 -  32 mmol/L   Glucose, Bld 110 (H) 70 - 99 mg/dL   BUN 18 8 - 23 mg/dL   Creatinine, Ser 8.73 (H) 0.44 - 1.00 mg/dL   Calcium  9.2 8.9 - 10.3 mg/dL   GFR, Estimated 44 (L) >60 mL/min   Anion gap 8 5 - 15     I have reviewed referring/prior physicians records  I have reviewed urinalysis--microscopic hematuria noted  I have reviewed recent blood studies-CMP--estimated GFR 44, creatinine 1.26.  CBC--hemoglobin 10.6/hematocrit 33.0  I reviewed prior imaging studies--CT from July 28  Assessment: Small bladder masses on CT scan-rule out urothelial carcinoma  Microscopic hematuria  Plan: I have discussed cystoscopy with the patient and her family  We will bring her back next available for cystoscopy

## 2024-03-07 NOTE — ED Notes (Signed)
 Pt. Is in bed; assessed for pain and pain went down from an 8/10 to 6/10; pt. Expressed that pain only occurs when she coughs or sneezes

## 2024-03-07 NOTE — ED Triage Notes (Signed)
 Pt arrives POV c/o shob since her fall on 7/17 that has gradually gotten worse. Pt states that she has also now had nausea for a couple days.

## 2024-03-07 NOTE — ED Notes (Signed)
 Pt transported to CT ?

## 2024-03-07 NOTE — Discharge Instructions (Addendum)
 Thank you for allowing us  to care for you today.  You came to emergency department because you were having continued chest wall tenderness after a fall.  We performed laboratory studies as well as chest x-ray imaging which did not show evidence of acute fracture or injury secondary to your fall.  Of note, on your CT imaging there was an abnormality seen in your bladder concerning for possible mass.  Therefore, I have given you a referral to a urologist.  Please call them to schedule an appointment in the outpatient setting.  Please be sure to follow-up with your primary care provider and return to emergency department if you experience worsening pain, chest pain, shortness of breath, passout or believe any other emergent medical care  We hope you have a better soon Erin Bolster DO  Emergency Medicine PGY2

## 2024-03-07 NOTE — ED Notes (Signed)
 Walked patient to the bathroom patient did well patient is now back in bed on there monitor with family at bedside and call bell in reach

## 2024-03-07 NOTE — ED Notes (Signed)
 Oxycodone  wasted in med room after patient discharge with other nurse Damien Search RN EID: [id number]

## 2024-03-07 NOTE — ED Provider Notes (Signed)
 [facility] Provider Note   CSN: [id number] Arrival date & time: 03/07/24  0845     Patient presents with: Shortness of Breath and Nausea   Erin Coffey is a 78 y.o. female past medical history significant for DVT on Eliquis  who presents emergency department for left chest wall tenderness after fall.  Patient was recently seen in the ED on 7/17 for a mechanical ground-level fall in which she landed on her left side.  At that time patient had no acute injury and was discharged home.  Patient states that since that time she has had worsening left-sided chest pain with associated intermittent shortness of breath.  Patient denies associated fever, productive cough, hematemesis, hemoptysis.  Patient denies recurrent falls.    Shortness of Breath      Prior to Admission medications   Medication Sig Start Date End Date Taking? Authorizing Provider  oxyCODONE  (ROXICODONE ) 5 MG immediate release tablet Take 1 tablet (5 mg total) by mouth every 6 (six) hours as needed for up to 3 days for severe pain (pain score 7-10). 03/07/24 03/10/24 Yes Nada Chroman, DO  acetaminophen  (TYLENOL ) 650 MG CR tablet Take 1,300 mg by mouth every 8 (eight) hours as needed for pain.    [provider]  alendronate  (FOSAMAX ) 70 MG tablet TAKE 1 TABLET BY MOUTH ONCE A WEEK WITH 8 OZ OF PLAIN WATER 30 MINUTES BEFORE FIRST FOOD, DRINK, OR MEDS. STAY UPRIGHT FOR 30 MINUTES 01/29/24   Bryan Bianchi, MD  amLODipine  (NORVASC ) 5 MG tablet Take 1 tablet (5 mg total) by mouth at bedtime. 08/17/23   Bryan Bianchi, MD  anastrozole  (ARIMIDEX ) 1 MG tablet Take 1 tablet by mouth once daily 02/17/24   Causey, Lindsey Cornetto, NP  apixaban  (ELIQUIS ) 5 MG TABS tablet Take 1 tablet (5 mg total) by mouth 2 (two) times daily. 08/17/23   Bryan Bianchi, MD  atorvastatin  (LIPITOR) 40 MG tablet Take 1 tablet (40 mg total) by mouth daily. 08/17/23   Bryan Bianchi, MD  baclofen  (LIORESAL )  10 MG tablet Take 1 tablet (10 mg total) by mouth 3 (three) times daily. Patient taking differently: Take 10 mg by mouth 3 (three) times daily. Taking PRN 01/29/24   Bryan Bianchi, MD  CHOLECALCIFEROL PO Take 2,200 Units by mouth daily.    [provider]  diclofenac  Sodium (VOLTAREN ) 1 % GEL Apply 2 g topically 4 (four) times daily as needed (pain). 03/23/23   Bryan Bianchi, MD  Ferrous Sulfate (IRON  PO) Take 25 mg by mouth. Gentle Iron  from [employer]    [provider]  fish oil-omega-3 fatty acids 1000 MG capsule Take 1 g by mouth daily.    [provider]  ketoconazole  (NIZORAL ) 2 % cream Apply to both feet once daily for 4 weeks. 12/24/23   Magdalen Pasco RAMAN, DPM  losartan  (COZAAR ) 100 MG tablet Take 1 tablet (100 mg total) by mouth daily. 08/17/23 03/01/24  Bryan Bianchi, MD  metoprolol  tartrate (LOPRESSOR ) 25 MG tablet Take 1 tablet (25 mg total) by mouth 2 (two) times daily. 08/17/23   Bryan Bianchi, MD  omeprazole  (PRILOSEC) 20 MG capsule Take 1 capsule (20 mg total) by mouth daily. 05/03/21   Palumbo, April, MD  ondansetron  (ZOFRAN -ODT) 4 MG disintegrating tablet Take 1 tablet (4 mg total) by mouth every 8 (eight) hours as needed for nausea or vomiting. 08/17/23   Bryan Bianchi, MD  polyethylene glycol powder (MIRALAX ) 17 GM/SCOOP powder Take 17  g by mouth daily as needed (constipation). 08/17/23   Bryan Bianchi, MD  traMADol  (ULTRAM ) 50 MG tablet TAKE 1 TABLET BY MOUTH EVERY 6 HOURS AS NEEDED 10/26/23   Bryan Bianchi, MD  vitamin C (ASCORBIC ACID) 500 MG tablet Take 500 mg by mouth daily.    [provider]    Allergies: Lisinopril    Review of Systems  Respiratory:  Positive for shortness of breath.   Reviewed in HPI  Updated Vital Signs BP (!) 154/50 (BP Location: Right Arm)   Pulse (!) 50   Temp 98.8 F (37.1 C) (Oral)   Resp 16   Ht 5' 8 (1.727 m)   Wt 121.1 kg   SpO2 100%   BMI 40.60 kg/m   Physical Exam Vitals and nursing note  reviewed.  Constitutional:      General: She is not in acute distress.    Appearance: She is not ill-appearing.  HENT:     Head: Normocephalic and atraumatic.  Cardiovascular:     Rate and Rhythm: Regular rhythm.     Heart sounds: Normal heart sounds. No murmur heard. Pulmonary:     Effort: Pulmonary effort is normal. No tachypnea.     Breath sounds: Normal breath sounds. No decreased air movement. No decreased breath sounds, wheezing or rhonchi.  Chest:     Comments: Left chest wall tenderness to palpation with nonacute ecchymosis Abdominal:     General: There is no distension.     Palpations: Abdomen is soft.     Tenderness: There is abdominal tenderness in the left upper quadrant. There is no guarding or rebound.  Neurological:     Mental Status: She is alert.  Psychiatric:        Behavior: Behavior is cooperative.     (all labs ordered are listed, but only abnormal results are displayed) Labs Reviewed  CBC WITH DIFFERENTIAL/PLATELET - Abnormal; Notable for the following components:      Result Value   WBC 3.7 (*)    RBC 3.55 (*)    Hemoglobin 10.6 (*)    HCT 33.0 (*)    All other components within normal limits  BASIC METABOLIC PANEL WITH GFR - Abnormal; Notable for the following components:   CO2 21 (*)    Glucose, Bld 110 (*)    Creatinine, Ser 1.26 (*)    GFR, Estimated 44 (*)    All other components within normal limits    EKG: EKG Interpretation Date/Time:  Monday March 07 2024 09:05:26 EDT Ventricular Rate:  72 PR Interval:  191 QRS Duration:  96 QT Interval:  397 QTC Calculation: 435 R Axis:   -1  Text Interpretation: Sinus rhythm Multiple ventricular premature complexes No significant change since last tracing Confirmed by Dasie Faden ([id number]) on 03/07/2024 9:11:09 AM  Radiology: CT CHEST ABDOMEN PELVIS W CONTRAST Result Date: 03/07/2024 CLINICAL DATA:  Abdominal pain status post mechanical fall on Eliquis . Shortness of breath. EXAM: CT CHEST,  ABDOMEN, AND PELVIS WITH CONTRAST TECHNIQUE: Multidetector CT imaging of the chest, abdomen and pelvis was performed following the standard protocol during bolus administration of intravenous contrast. RADIATION DOSE REDUCTION: This exam was performed according to the departmental dose-optimization program which includes automated exposure control, adjustment of the mA and/or kV according to patient size and/or use of iterative reconstruction technique. CONTRAST:  75mL OMNIPAQUE  IOHEXOL  350 MG/ML SOLN COMPARISON:  Same day chest radiograph, CT abdomen and pelvis dated 10/17/2022, CTA chest dated 05/03/2021 FINDINGS: CT CHEST FINDINGS Cardiovascular: Multichamber  cardiomegaly. No significant pericardial fluid/thickening. Great vessels are normal in course and caliber. No central pulmonary emboli. Coronary artery calcifications. Mediastinum/Nodes: Imaged thyroid  gland without nodules meeting criteria for imaging follow-up by size. Normal esophagus. No pathologically enlarged axillary, supraclavicular, mediastinal, or hilar lymph nodes. Lungs/Pleura: The central airways are patent. No focal consolidation. No pneumothorax. No pleural effusion. Musculoskeletal: No acute or abnormal lytic or blastic osseous lesions. Multilevel degenerative changes of the thoracic spine. Surgical clips in the right breast adjacent to a 4.8 x 3.6 cm fluid collection (3:25), which may be postsurgical. CT ABDOMEN PELVIS FINDINGS Hepatobiliary: Subcentimeter arterially enhancing focus within segment 2 (3:49), unchanged from 10/17/2022, likely a flash filling venous malformation or perfusional variation. No intra or extrahepatic biliary ductal dilation. Nondistended gallbladder. Mild pericholecystic haziness Pancreas: No focal lesions or main ductal dilation. Spleen: Unchanged multifocal splenic hypodensities. Normal splenic size. Adrenals/Urinary Tract: No adrenal nodules. No suspicious renal mass, calculi, or hydronephrosis. Simple interpolar  right renal cyst. Additional subcentimeter hypodensities, too small to characterize. New enhancing foci within the anterior right bladder wall measuring up to 9 x 8 mm (3:97, 100). Stomach/Bowel: Normal appearance of the stomach. No evidence of bowel wall thickening, distention, or inflammatory changes. Sigmoid diverticulosis without acute diverticulitis. Normal appendix. Vascular/Lymphatic: Aortic atherosclerosis. No enlarged abdominal or pelvic lymph nodes. Reproductive: No adnexal masses. Other: No free fluid, fluid collection, or free air. Musculoskeletal: No acute or abnormal lytic or blastic osseous findings. Multilevel degenerative changes of the lumbar spine. Unchanged L1 superior endplate compression deformity. IMPRESSION: 1. No acute traumatic injury in the chest, abdomen, or pelvis. 2. Mild pericholecystic haziness, nonspecific in the setting of a nondistended gallbladder. Recommend correlation with right upper quadrant pain. 3. New enhancing foci within the anterior right bladder wall measuring up to 9 x 8 mm, suspicious for bladder neoplasm. Recommend urology consultation. 4. Surgical clips in the right breast adjacent to a 4.8 x 3.6 cm fluid collection, which may be postsurgical. Recommend correlation with dedicated breast imaging. 5. Aortic Atherosclerosis (ICD10-I70.0). Coronary artery calcifications. Assessment for potential risk factor modification, dietary therapy or pharmacologic therapy may be warranted, if clinically indicated. Electronically Signed   By: Limin  Xu M.D.   On: 03/07/2024 12:09   DG Chest 2 View Result Date: 03/07/2024 CLINICAL DATA:  SOB EXAM: CHEST - 2 VIEW COMPARISON:  02/25/2024 FINDINGS: Lungs are clear. Heart size and mediastinal contours are within normal limits. Aortic Atherosclerosis (ICD10-170.0). No effusion. Vertebral endplate spurring at multiple levels in the lower thoracic spine. Chronic mild L1 vertebral compression deformity. IMPRESSION: No acute  cardiopulmonary disease. Electronically Signed   By: JONETTA Faes M.D.   On: 03/07/2024 10:30     Procedures   Medications Ordered in the ED  oxyCODONE  (Oxy IR/ROXICODONE ) immediate release tablet 5 mg (5 mg Oral Not Given 03/07/24 1002)  acetaminophen  (TYLENOL ) tablet 650 mg (650 mg Oral Given 03/07/24 1002)  iohexol  (OMNIPAQUE ) 350 MG/ML injection 75 mL (75 mLs Intravenous Contrast Given 03/07/24 1130)    Clinical Course as of 03/07/24 1639  Mon Mar 07, 2024  0955 Chest x-ray reviewed by me: Trachea midline, no pneumothorax.  No pleural effusion, pulmonary edema.  No focal consolidation.  Mediastinum within normal limits [AG]    Clinical Course User Index [AG] Nada Chroman, DO                                 Medical Decision Making Amount and/or  Complexity of Data Reviewed Labs: ordered. Radiology: ordered.  Risk OTC drugs. Prescription drug management.   On initial evaluation patient is hemodynamically stable, afebrile and not in acute distress.  Based upon patient's history and physical examination findings differential diagnosis include: Occult fracture, hemothorax, pneumothorax, intra-abdominal injury including splenic rupture, hematoma, acute blood loss with resulting anemia.  Based upon history and physical examination findings, will obtain laboratory studies as well as chest x-ray and CT of the chest abdomen and pelvis.  CT and x-ray imaging without evidence of acute cardiac, pulmonary or intra-abdominal abnormality.  As patient remains hemodynamically stable, afebrile and not in acute distress without need for supplemental oxygenation believe patient is safe to be discharged home with pain control.  Incidental finding found on CT imaging with new bladder mass, urology consult given a discharge and patient was advised to follow-up in the outpatient setting.  Patient states that she has incentive spirometry at home and will continue to use this multiple times per day.  Patient was  given strict return precautions and agreed with and understood plan of care at time of discharge     Final diagnoses:  Other chest pain  Chest wall pain    ED Discharge Orders          Ordered    oxyCODONE  (ROXICODONE ) 5 MG immediate release tablet  Every 6 hours PRN        03/07/24 1245            Lavanda Bolster DO  Emergency Medicine PGY2   Bolster Lavanda, DO 03/07/24 1640    Dasie Faden, MD 03/08/24 [phone]

## 2024-03-07 NOTE — ED Provider Notes (Signed)
 I saw and evaluated the patient, reviewed the resident's note and I agree with the findings and plan.  EKG Interpretation Date/Time:  Monday March 07 2024 09:05:26 EDT Ventricular Rate:  72 PR Interval:  191 QRS Duration:  96 QT Interval:  397 QTC Calculation: 435 R Axis:   -1  Text Interpretation: Sinus rhythm Multiple ventricular premature complexes No significant change since last tracing Confirmed by Dasie Faden ([id number]) on 03/07/2024 9:11:09 AM   Patient's EKG shows sinus rhythm.  No ischemic changes noted.  Patient here with continued left-sided pain after mechanical fall.  Patient is on Eliquis .  Complains of some increased dyspnea exertion.  On exam, she is tender at her left anterior chest wall as well as her left quadrant.  Will need imaging of her chest and abdomen to rule out internal hemorrhage   Dasie Faden, MD 03/07/24 0930

## 2024-03-14 ENCOUNTER — Ambulatory Visit: Admitting: Urology

## 2024-03-14 VITALS — BP 163/79 | HR 59 | Ht 68.0 in | Wt 267.0 lb

## 2024-03-14 DIAGNOSIS — N3289 Other specified disorders of bladder: Secondary | ICD-10-CM

## 2024-03-14 DIAGNOSIS — R3129 Other microscopic hematuria: Secondary | ICD-10-CM | POA: Diagnosis not present

## 2024-03-14 LAB — URINALYSIS, ROUTINE W REFLEX MICROSCOPIC
Bilirubin, UA: NEGATIVE
Glucose, UA: NEGATIVE
Ketones, UA: NEGATIVE
Leukocytes,UA: NEGATIVE
Nitrite, UA: NEGATIVE
Specific Gravity, UA: <1.005 — ABNORMAL LOW (ref 1.005–1.030)
Urobilinogen, Ur: 0.2 mg/dL (ref 0.2–1.0)
pH, UA: 5.5 (ref 5.0–7.5)

## 2024-03-14 LAB — MICROSCOPIC EXAMINATION

## 2024-03-28 ENCOUNTER — Other Ambulatory Visit: Payer: Self-pay

## 2024-03-28 MED ORDER — METOPROLOL TARTRATE 25 MG PO TABS: 25.0000 mg | ORAL_TABLET | Freq: Two times a day (BID) | ORAL | 2 refills | Status: DC

## 2024-04-08 DIAGNOSIS — H2513 Age-related nuclear cataract, bilateral: Secondary | ICD-10-CM | POA: Diagnosis not present

## 2024-04-08 DIAGNOSIS — E119 Type 2 diabetes mellitus without complications: Secondary | ICD-10-CM | POA: Diagnosis not present

## 2024-04-08 DIAGNOSIS — H53143 Visual discomfort, bilateral: Secondary | ICD-10-CM | POA: Diagnosis not present

## 2024-04-14 ENCOUNTER — Telehealth: Payer: Self-pay

## 2024-04-14 NOTE — Telephone Encounter (Signed)
 Patient calls nurse line requesting a valium .   She reports she has an apt with with Urology on 9/8. She reports she is having biopsies done on both renal cysts.   She reports she is very nervous about this. She reports her daughter will be taking her to and from apt.   Advised will forward to PCP.

## 2024-04-15 ENCOUNTER — Telehealth: Payer: Self-pay

## 2024-04-15 NOTE — Telephone Encounter (Signed)
 Pt called requesting a valium  to have biopsy next week. Reinforced with pt she will not be having a bx but will have a cysto. Reinforced with pt what a cysto procedure was and how it was preformed. Made pt aware valium  is not given for such procedures. Reinforced with pt the purpose of cysto and how it will allow insight into what needs to happen next. Pt was very thankful and voiced understanding.

## 2024-04-17 NOTE — Progress Notes (Signed)
 Assessment: Small bladder masses on CT scan-cystoscopy today was negative Microscopic hematuria  Plan: -Voided urine sent for cytology  -I will see her back in 3 months.  If persistent microscopic hematuria, I will repeat cystoscopy   History of Present Illness:  8.4.2025: Initial visit for evaluation of bladder abnormality seen on recent CT scan.  She presented to the emergency room about 10 days after sustaining a fall at church.  She is on Eliquis .  CT findings: 1. No acute traumatic injury in the chest, abdomen, or pelvis. 2. Mild pericholecystic haziness, nonspecific in the setting of a nondistended gallbladder. Recommend correlation with right upper quadrant pain. 3. New enhancing foci within the anterior right bladder wall measuring up to 9 x 8 mm, suspicious for bladder neoplasm. Recommend urology consultation. 4. Surgical clips in the right breast adjacent to a 4.8 x 3.6 cm fluid collection, which may be postsurgical. Recommend correlation with dedicated breast imaging. 5. Aortic Atherosclerosis (ICD10-I70.0). Coronary artery calcifications. Assessment for potential risk factor modification, dietary therapy or pharmacologic therapy may be warranted, if clinically indicated.  Since that visit, she has had 1 episode of gross painless hematuria.  She does not have history with high risk factors for urothelial carcinoma.  9.8.2025: Here for cysto.  She has had no recent gross hematuria.  Past Medical History:  Past Medical History:  Diagnosis Date   Allergy    Anemia, iron  deficiency    ANXIETY, SITUATIONAL 04/06/2009   Qualifier: Diagnosis of  By: Flint  MD, Stephanie     Arthritis    Back pain, thoracic 12/20/2012   Carpal tunnel syndrome 12/07/2014   Cholelithiasis 06/25/2012   Chronic kidney disease    DM type 2 goal A1C below 7.5 06/05/2008   Qualifier: Diagnosis of  By: Flint  MD, Corean     Family history of breast cancer 03/05/2022   GASTROESOPHAGEAL  REFLUX, NO ESOPHAGITIS 10/08/2006   Qualifier: Diagnosis of  By: [place] Rocky Saliva, unspecified 10/21/2007   Qualifier: Diagnosis of  By: ROJELIO MD, [name]     History of DVT (deep vein thrombosis) 05/05/2013   on Eliquis    HYPERLIPIDEMIA 08/08/2008   Qualifier: Diagnosis of  By: [place]  MD, Stephanie     HYPERTENSION, BENIGN ESSENTIAL 11/27/2006   Qualifier: Diagnosis of  ByBETHA ROJELIO MD, [name]     Mild coronary artery disease 10/2019   Coronary CT Angiogram: Coronary calcium  score 46.Normal coronary origin.  Left dominance with tortuous coronary arteries.  Nondominant RCA - prox < 25% followed by ectasia (dilated to 5.5 mm) mixed plaque in distal vessel.  LM- normal . LAD: minimal mixed plaque in ostial & proximal (<25%) with brief IM bridging in mLAD. Mild mid-distal (25-49%), RI - prox <25%, Large LCx- minimal (<25%) mid-dista   Mild coronary artery disease    Personal history of colonic polyps 10/24/2009   Qualifier: Diagnosis of  By: Debrah MD, Lamar BIRCH    [place] 08/08/2009   Qualifier: Diagnosis of  By: [place], [name]     Pituitary adenoma [facility])    Polymyalgia rheumatica (HCC) 10/02/2009   Qualifier: Diagnosis of  By: Rickard MD, Vernell     Pulmonary embolism [facility]) - On long Term anticoagulation (Z79.01) 04/28/2011   Converted from warfarin to Eliquis ; on long-term anticoagulation.   Thyroid  disease    Trigger ring finger of left hand 11/02/2014    Past Surgical History:  Past Surgical History:  Procedure Laterality Date   ABDOMINAL HYSTERECTOMY  BREAST LUMPECTOMY WITH RADIOACTIVE SEED LOCALIZATION Right 03/25/2022   Procedure: RIGHT BREAST SEED LUMPECTOMY;  Surgeon: Vanderbilt Ned, MD;  Location: [facility];  Service: General;  Laterality: Right;   CARDIAC CATHETERIZATION  04/29/2011   (Dr. Morris - [place]): LM - normal. LAD with 1 major Diag - normal, bifurcating Ramus - normal, Large (6-7 mm) Dominant LCx with 1 OM, 2 LPL branches & LPDA - non  significant disease; small non-dominant RCA.     CARDIAC EVENT MONITOR  09/2019   Mostly sinus rhythm, average rate 60 bpm.  Range 45 to 190 bpm.  No critical events indicating any arrhythmias or pauses.  For patient responses noted PVCs.  Less than 1% PVCs noted.    CARPAL TUNNEL RELEASE Right 1996   COLONOSCOPY     KNEE ARTHROSCOPY  Bilateral   masctomy     TRANSTHORACIC ECHOCARDIOGRAM  09/2019   Normal EF - 60 to 65%.  Mild LVH.  GR 1 DD.  Normal RV.  Trivial MR.  Normal IVC.  Normal PA pressures.   TUBAL LIGATION     UPPER GASTROINTESTINAL ENDOSCOPY      Allergies:  Allergies  Allergen Reactions   Lisinopril Cough    Family History:  Family History  Problem Relation Age of Onset   Diabetes Mother    Heart disease Mother    Hypertension Mother    Heart disease Father    Hypertension Father    Diabetes Sister    Heart disease Sister    Leukemia Sister 61   Cirrhosis Sister        alcohol   Kidney disease Sister    Diabetes Sister    Hypertension Brother    Diabetes Brother    Breast cancer Cousin        dx 38s; maternal female cousin   Colon cancer Neg Hx    Pancreatic cancer Neg Hx    Esophageal cancer Neg Hx     Social History:  Social History   Tobacco Use   Smoking status: Never    Passive exposure: Never   Smokeless tobacco: Never  Vaping Use   Vaping status: Never Used  Substance Use Topics   Alcohol use: No    Alcohol/week: 0.0 standard drinks of alcohol   Drug use: No     Indication: Bladder mass  After informed consent and discussion of the procedure and its risks, pt was positioned and prepped in the standard fashion.  Cystoscopy was performed with a flexible cystoscope.   Findings:  Urethra: Normal, no lesions Ureteral orifices: Normally located, configured bilaterally. Bladder: Modest residual urine.  I saw no urothelial lesions.  Thorough, systematic inspection performed.     I have reviewed referring/prior physicians records  I  have reviewed urinalysis--microscopic hematuria noted  I have reviewed recent blood studies-CMP--estimated GFR 44, creatinine 1.26.  CBC--hemoglobin 10.6/hematocrit 33.0  I reviewed prior imaging studies--CT from July 28

## 2024-04-18 ENCOUNTER — Telehealth: Payer: Self-pay | Admitting: Urology

## 2024-04-18 ENCOUNTER — Encounter: Payer: Self-pay | Admitting: Urology

## 2024-04-18 ENCOUNTER — Ambulatory Visit (INDEPENDENT_AMBULATORY_CARE_PROVIDER_SITE_OTHER): Admitting: Urology

## 2024-04-18 VITALS — BP 146/65 | HR 69 | Ht 68.0 in | Wt 268.0 lb

## 2024-04-18 DIAGNOSIS — R3129 Other microscopic hematuria: Secondary | ICD-10-CM | POA: Diagnosis not present

## 2024-04-18 DIAGNOSIS — N3289 Other specified disorders of bladder: Secondary | ICD-10-CM | POA: Diagnosis not present

## 2024-04-18 DIAGNOSIS — R319 Hematuria, unspecified: Secondary | ICD-10-CM | POA: Diagnosis not present

## 2024-04-18 LAB — URINALYSIS, ROUTINE W REFLEX MICROSCOPIC
Bilirubin, UA: NEGATIVE
Glucose, UA: NEGATIVE
Ketones, UA: NEGATIVE
Leukocytes,UA: NEGATIVE
Nitrite, UA: NEGATIVE
Specific Gravity, UA: 1.01 (ref 1.005–1.030)
Urobilinogen, Ur: 0.2 mg/dL (ref 0.2–1.0)
pH, UA: 6.5 (ref 5.0–7.5)

## 2024-04-18 LAB — MICROSCOPIC EXAMINATION
Bacteria, UA: NONE SEEN
RBC, Urine: >30 /HPF — AB (ref 0–2)

## 2024-04-18 MED ORDER — CIPROFLOXACIN HCL 500 MG PO TABS
500.0000 mg | ORAL_TABLET | Freq: Once | ORAL | Status: AC
  Administered 2024-04-18: 500 mg via ORAL

## 2024-04-18 NOTE — Telephone Encounter (Signed)
 Patient had an allergic reaction today to the medication she was prescribed today. Would like a call from our office.

## 2024-04-19 NOTE — Telephone Encounter (Signed)
 Spoke with pt in reference to how she was feeling. Pt stated that she took an allegra at check out and her s/s improved. Pt inquired what medication you thought she was allergic to- cipro , iodine, or lidocaine . Please advise.

## 2024-04-20 NOTE — Telephone Encounter (Signed)
LMOM requesting return call

## 2024-04-21 NOTE — Telephone Encounter (Signed)
 Pt called back to speak to [facility]. Please advise.

## 2024-04-21 NOTE — Telephone Encounter (Signed)
 Made pt aware of Dr. Matilda saying cipro  was most likely her allergy. Also added cipro  to allergy list with itching as reaction.

## 2024-04-25 ENCOUNTER — Other Ambulatory Visit: Payer: Self-pay

## 2024-04-25 DIAGNOSIS — G8929 Other chronic pain: Secondary | ICD-10-CM

## 2024-04-25 NOTE — Telephone Encounter (Signed)
 Patient calls nurse line requesting a refill on Tramadol .   She reports she has not needed this refilled since March 2025. She reports she uses this sparingly.  Patient scheduled with new PCP for 9/30 to meet new PCP.   Advised will forward to PCP.

## 2024-04-26 LAB — HEMATURIA PROFILE

## 2024-04-27 ENCOUNTER — Encounter: Payer: Self-pay | Admitting: Urology

## 2024-04-27 MED ORDER — TRAMADOL HCL 50 MG PO TABS: 50.0000 mg | ORAL_TABLET | Freq: Four times a day (QID) | ORAL | 0 refills | Status: DC | PRN

## 2024-05-03 DIAGNOSIS — R809 Proteinuria, unspecified: Secondary | ICD-10-CM | POA: Diagnosis not present

## 2024-05-03 DIAGNOSIS — N2581 Secondary hyperparathyroidism of renal origin: Secondary | ICD-10-CM | POA: Diagnosis not present

## 2024-05-03 DIAGNOSIS — D631 Anemia in chronic kidney disease: Secondary | ICD-10-CM | POA: Diagnosis not present

## 2024-05-03 DIAGNOSIS — E1122 Type 2 diabetes mellitus with diabetic chronic kidney disease: Secondary | ICD-10-CM | POA: Diagnosis not present

## 2024-05-03 DIAGNOSIS — E559 Vitamin D deficiency, unspecified: Secondary | ICD-10-CM | POA: Diagnosis not present

## 2024-05-03 DIAGNOSIS — I129 Hypertensive chronic kidney disease with stage 1 through stage 4 chronic kidney disease, or unspecified chronic kidney disease: Secondary | ICD-10-CM | POA: Diagnosis not present

## 2024-05-03 DIAGNOSIS — N1832 Chronic kidney disease, stage 3b: Secondary | ICD-10-CM | POA: Diagnosis not present

## 2024-05-09 ENCOUNTER — Other Ambulatory Visit: Payer: Self-pay

## 2024-05-09 MED ORDER — ALENDRONATE SODIUM 70 MG PO TABS: ORAL_TABLET | ORAL | 0 refills | Status: DC

## 2024-05-10 ENCOUNTER — Ambulatory Visit: Payer: Self-pay | Admitting: Family Medicine

## 2024-05-10 ENCOUNTER — Ambulatory Visit (INDEPENDENT_AMBULATORY_CARE_PROVIDER_SITE_OTHER): Admitting: Family Medicine

## 2024-05-10 ENCOUNTER — Encounter: Payer: Self-pay | Admitting: Family Medicine

## 2024-05-10 VITALS — BP 144/66 | HR 54 | Ht 68.0 in | Wt 269.4 lb

## 2024-05-10 DIAGNOSIS — Z23 Encounter for immunization: Secondary | ICD-10-CM

## 2024-05-10 DIAGNOSIS — I10 Essential (primary) hypertension: Secondary | ICD-10-CM | POA: Diagnosis not present

## 2024-05-10 DIAGNOSIS — E119 Type 2 diabetes mellitus without complications: Secondary | ICD-10-CM

## 2024-05-10 DIAGNOSIS — N3289 Other specified disorders of bladder: Secondary | ICD-10-CM | POA: Diagnosis not present

## 2024-05-10 LAB — POCT GLYCOSYLATED HEMOGLOBIN (HGB A1C): HbA1c, POC (controlled diabetic range): 5.8 % (ref 0.0–7.0)

## 2024-05-10 NOTE — Patient Instructions (Signed)
 It was wonderful to see you today!  Your A1c today was 5.8. This means your sugars are well controlled. Please continue to check your blood sugars daily, and take all of your medications, even when you feel well.   Please let me know if there are any updates to the concern with the new mass in your bladder, and please keep your upcoming appointment with urology. If you have any new symptoms, like urinary pain, frequency, or difficulty urinating, please reach out to the office right away.   Please reach out to your pharmacy for any medication refills.   Be Well, Dr. Lucie Pinal, DO

## 2024-05-10 NOTE — Assessment & Plan Note (Signed)
-  BP 144/66 today on recheck -Patient takes her BP medications half in the morning and half at bedtime, will call patient and discuss switching amlodipine  to AM and recheck at next visit.

## 2024-05-10 NOTE — Assessment & Plan Note (Signed)
-  A1c today stable at 5.8 -no new changes -follow up as needed

## 2024-05-10 NOTE — Progress Notes (Cosign Needed)
    SUBJECTIVE:   CHIEF COMPLAINT / HPI:   Presenting for A1c check, no changes to regimen recently, currently diet controlled  Fell back in July, was seen in ED on 9/8 for ongoing abdominal pain/soreness. CT scan at that time showed new ring enhancing lesion in the bladder wall. Patient is referred to urology and has upcoming appointment in November. Denies any urinary symptoms such as burning, hesitancy, dribbling, frequency. Does report intermittent pink tinge.   PERTINENT  PMH / PSH: HTN, HLD, T2DM  OBJECTIVE:   BP (!) 151/69   Pulse (!) 54   Ht 5' 8 (1.727 m)   Wt 269 lb 6 oz (122.2 kg)   SpO2 100%   BMI 40.96 kg/m   General: A&O, NAD HEENT: No sign of trauma, EOM grossly intact Cardiac: RRR, no m/r/g Respiratory: CTAB, normal WOB, no w/c/r GI: Soft, NTTP, non-distended  Extremities: NTTP, no peripheral edema. Neuro: Normal gait, moves all four extremities appropriately. Psych: Appropriate mood and affect   ASSESSMENT/PLAN:   Assessment & Plan Type 2 diabetes mellitus with hemoglobin A1c goal of less than 7.5% (HCC) -A1c today stable at 5.8 -no new changes -follow up as needed Bladder mass -noted on CT at 9/8 visit to ED -patient already has follow up scheduled with urology -will monitor and treat symptomatically as indicated.  Encounter for immunization -flu vaccine given HYPERTENSION, BENIGN ESSENTIAL -BP 144/66 today on recheck -Patient takes her BP medications half in the morning and half at bedtime, will call patient and discuss switching amlodipine  to AM and recheck at next visit.    Lucie Pinal, DO [facility]

## 2024-05-14 ENCOUNTER — Other Ambulatory Visit: Payer: Self-pay | Admitting: Adult Health

## 2024-05-18 ENCOUNTER — Other Ambulatory Visit: Payer: Self-pay | Admitting: Urology

## 2024-05-18 DIAGNOSIS — N3289 Other specified disorders of bladder: Secondary | ICD-10-CM

## 2024-05-19 ENCOUNTER — Ambulatory Visit: Payer: Self-pay

## 2024-05-19 NOTE — Telephone Encounter (Signed)
-----   Message from Garnette HERO Dahlstedt sent at 05/18/2024  1:20 PM EDT ----- Patient was called with results.  I suggested anesthetic cystoscopy and TURBT.  I put orders in.  Not urgent. ----- Message ----- From: Rebecka Memos Lab Results In Sent: 04/18/2024   1:36 PM EDT To: Garnette Shack, MD

## 2024-05-19 NOTE — Telephone Encounter (Signed)
 Ordered received and surgery scheduled for 06/06/24.

## 2024-05-23 ENCOUNTER — Other Ambulatory Visit: Payer: Self-pay | Admitting: Cardiology

## 2024-05-23 ENCOUNTER — Ambulatory Visit

## 2024-05-23 ENCOUNTER — Ambulatory Visit: Attending: Cardiology | Admitting: Cardiology

## 2024-05-23 ENCOUNTER — Encounter: Payer: Self-pay | Admitting: Cardiology

## 2024-05-23 VITALS — BP 136/60 | HR 63 | Resp 16 | Ht 68.0 in | Wt 264.4 lb

## 2024-05-23 DIAGNOSIS — I493 Ventricular premature depolarization: Secondary | ICD-10-CM

## 2024-05-23 DIAGNOSIS — Z01810 Encounter for preprocedural cardiovascular examination: Secondary | ICD-10-CM

## 2024-05-23 DIAGNOSIS — I2510 Atherosclerotic heart disease of native coronary artery without angina pectoris: Secondary | ICD-10-CM | POA: Diagnosis not present

## 2024-05-23 DIAGNOSIS — I10 Essential (primary) hypertension: Secondary | ICD-10-CM

## 2024-05-23 DIAGNOSIS — Z6841 Body Mass Index (BMI) 40.0 and over, adult: Secondary | ICD-10-CM

## 2024-05-23 DIAGNOSIS — I2699 Other pulmonary embolism without acute cor pulmonale: Secondary | ICD-10-CM | POA: Diagnosis not present

## 2024-05-23 DIAGNOSIS — E1169 Type 2 diabetes mellitus with other specified complication: Secondary | ICD-10-CM

## 2024-05-23 DIAGNOSIS — Z7901 Long term (current) use of anticoagulants: Secondary | ICD-10-CM | POA: Diagnosis not present

## 2024-05-23 DIAGNOSIS — E785 Hyperlipidemia, unspecified: Secondary | ICD-10-CM | POA: Diagnosis not present

## 2024-05-23 DIAGNOSIS — Z86711 Personal history of pulmonary embolism: Secondary | ICD-10-CM | POA: Diagnosis not present

## 2024-05-23 NOTE — Patient Instructions (Addendum)
 Medication Instructions:  No changes     You can  Eliquis  2-3 days  prior to surgery per Dr Dorothye  Lead.  *If you need a refill on your cardiac medications before your next appointment, please call your pharmacy*   Lab Work: Not needed    Testing/Procedures: Please schedule before Oct 24 , 2025  1) Your physician has requested that you have an echocardiogram. Echocardiography is a painless test that uses sound waves to create images of your heart. It provides your doctor with information about the size and shape of your heart and how well your heart's chambers and valves are working. This procedure takes approximately one hour. There are no restrictions for this procedure. Please do NOT wear cologne, perfume, aftershave, or lotions (deodorant is allowed). Please arrive 15 minutes prior to your appointment time.  Please note: We ask at that you not bring children with you during ultrasound (echo/ vascular) testing. Due to room size and safety concerns, children are not allowed in the ultrasound rooms during exams. Our front office staff cannot provide observation of children in our lobby area while testing is being conducted. An adult accompanying a patient to their appointment will only be allowed in the ultrasound room at the discretion of the ultrasound technician under special circumstances. We apologize for any inconvenience.  2) Your physician has recommended that you wear a holter monitor 3 DAY Zio. Holter monitors are medical devices that record the heart's electrical activity. Doctors most often use these monitors to diagnose arrhythmias. Arrhythmias are problems with the speed or rhythm of the heartbeat. The monitor is a small, portable device. You can wear one while you do your normal daily activities. This is usually used to diagnose what is causing palpitations/syncope (passing out).   Follow-Up: At [facility], you and your health needs are our priority.  As part of our  continuing mission to provide you with exceptional heart care, we have created designated Provider Care Teams.  These Care Teams include your primary Cardiologist (physician) and Advanced Practice Providers (APPs -  Physician Assistants and Nurse Practitioners) who all work together to provide you with the care you need, when you need it.     Your next appointment:   4 month(s)  The format for your next appointment:   In Person  Provider:   Alm Clay, MD   Other Instructions   ZIO XT- Long Term Monitor Instructions  Your physician has requested you wear a ZIO patch monitor for 3 days.  This is a single patch monitor. [employer] supplies one patch monitor per enrollment. Additional stickers are not available. Please do not apply patch if you will be having a Nuclear Stress Test,  Echocardiogram, Cardiac CT, MRI, or Chest Xray during the period you would be wearing the  monitor. The patch cannot be worn during these tests. You cannot remove and re-apply the  ZIO XT patch monitor.  Your ZIO patch monitor will be mailed 3 day USPS to your address on file. It may take 3-5 days  to receive your monitor after you have been enrolled.  Once you have received your monitor, please review the enclosed instructions. Your monitor  has already been registered assigning a specific monitor serial # to you.  Billing and Patient Assistance Program Information  We have supplied [employer] with any of your insurance information on file for billing purposes. [employer] offers a sliding scale Patient Assistance Program for patients that do not have  insurance, or whose  insurance does not completely cover the cost of the ZIO monitor.  You must apply for the Patient Assistance Program to qualify for this discounted rate.  To apply, please call [employer] at [phone], select option 4, select option 2, ask to apply for  Patient Assistance Program. Meredeth will ask your household income, and how many people   are in your household. They will quote your out-of-pocket cost based on that information.  [employer] will also be able to set up a 63-month, interest-free payment plan if needed.  Applying the monitor   Shave hair from upper left chest.  Hold abrader disc by orange tab. Rub abrader in 40 strokes over the upper left chest as  indicated in your monitor instructions.  Clean area with 4 enclosed alcohol pads. Let dry.  Apply patch as indicated in monitor instructions. Patch will be placed under collarbone on left  side of chest with arrow pointing upward.  Rub patch adhesive wings for 2 minutes. Remove white label marked 1. Remove the white  label marked 2. Rub patch adhesive wings for 2 additional minutes.  While looking in a mirror, press and release button in center of patch. A small green light will  flash 3-4 times. This will be your only indicator that the monitor has been turned on.  Do not shower for the first 24 hours. You may shower after the first 24 hours.  Press the button if you feel a symptom. You will hear a small click. Record Date, Time and  Symptom in the Patient Logbook.  When you are ready to remove the patch, follow instructions on the last 2 pages of Patient  Logbook. Stick patch monitor onto the last page of Patient Logbook.  Place Patient Logbook in the blue and white box. Use locking tab on box and tape box closed  securely. The blue and white box has prepaid postage on it. Please place it in the mailbox as  soon as possible. Your physician should have your test results approximately 7 days after the  monitor has been mailed back to [facility].  Call [facility] Customer Care at [phone] if you have questions regarding  your ZIO XT patch monitor. Call them immediately if you see an orange light blinking on your  monitor.  If your monitor falls off in less than 4 days, contact our Monitor department at [phone].  If your monitor becomes loose or  falls off after 4 days call [employer] at [phone] for  suggestions on securing your monitor

## 2024-05-23 NOTE — Progress Notes (Signed)
" Cardiology Office Note:  .   Date:  05/25/2024  ID:  Sundeep M Whobrey, DOB 1946-03-26, MRN [id number] PCP: [name], DO  [facility] Providers Cardiologist:  Alm Clay, MD     Chief Complaint  Patient presents with   Other acute pulmonary embolism without acute cor pulmonale    Mild coronary artery disease   Follow-up    Doing well.  Needs preop assessment for bladder cancer surgery    Patient Profile: Erin Coffey is a morbidly obese 78 y.o. female  with a PMH noted below who presents here for delayed annual follow-up and preop assessment.   Urologist: Dr. 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  She was undergoing treatment for breast cancer taken oral medications and having XRT.  No surgery.  She had lost 13 pounds but was otherwise stable cardiac standpoint.  BP was initially elevated but better on recheck.  Asymptomatic from cardiac standpoint.  No medication changes made.  Plan was for 1 year follow-up.  Subjective  Discussed the use of AI scribe software for clinical note transcription with the patient, who gave verbal consent to proceed.  History of Present Illness Erin Coffey is a 78 year old female with a history of breast cancer and pulmonary embolism who presents for preoperative evaluation for a bladder procedure.  She is scheduled for a bladder procedure on October 27th due to cancer cells found in her bladder. The cancer cells were discovered following a cystoscopy on September 8th, after a CT scan on July 28th revealed a mass in the anterior right bladder wall. She is concerned about managing her anticoagulation therapy, Eliquis , in preparation for the procedure.  Her medical history  includes breast cancer, for which she underwent a lumpectomy on her right breast followed by radiation therapy. She is currently on Arimidex  as part of her cancer treatment. She has experienced a weight loss from 277 pounds to 264 pounds since her last visit.  She has a history of a pulmonary embolism in 2012 and is on long-term Eliquis  therapy. She experienced a fall on July 17th, resulting in bruised ribs and a head injury, but has since recovered. She denies chest pain, shortness of breath, or swelling in her legs.  She is able to walk about 1.2 miles and perform light household activities, although limited by knee pain. No significant heart-related symptoms such as chest pain, pressure, or tightness during physical activity, but notes occasional heart flutters when anxious.  Her family history includes a father who had bladder cancer, which is relevant to her current condition.   Cardiovascular ROS: no chest pain or dyspnea on exertion positive for - mild end of day swelling, and dyspnea if she overdoes it.  More limited by knee pain. negative for - chest pain, edema, irregular heartbeat, loss of consciousness, orthopnea, palpitations, paroxysmal nocturnal dyspnea, rapid heart rate, shortness of breath, or lightheadedness dizziness or wooziness, near syncope, TIA/amaurosis fugax, claudication.  Melena, hematochezia, hematuria or epistaxis.  ROS:  Review of Systems - Negative except occasional nausea but otherwise 08 pain    Objective   Current CV Meds Amlodipine  5 mg nightly; losartan  100 mg daily; Lopressor  25 mg twice daily Eliquis   5 mg twice daily Atorvastatin  40 mg daily; omega-3 fatty acids (1 g daily   Other Medications Sig   acetaminophen  (TYLENOL ) 650 MG CR tablet Take 1,300 mg by mouth every 8 (eight) hours as needed for pain.   alendronate  (FOSAMAX ) 70 MG tablet TAKE 1 TABLET BY MOUTH ONCE A WEEK WITH 8 OZ OF PLAIN WATER  30 MINUTES BEFORE FIRST FOOD, DRINK, OR MEDS. STAY  UPRIGHT FOR 30 MINUTES   anastrozole  (ARIMIDEX ) 1 MG tablet Take 1 tablet by mouth once daily   baclofen  (LIORESAL ) 10 MG tablet Take 1 tablet (10 mg total) by mouth 3 (three) times daily.   CHOLECALCIFEROL PO Take 2,200 Units by mouth daily.   diclofenac  Sodium (VOLTAREN ) 1 % GEL Apply 2 g topically 4 (four) times daily as needed (pain).   Ferrous Sulfate (IRON  PO) Take 25 mg by mouth. Gentle Iron  from [employer] store   ketoconazole  (NIZORAL ) 2 % cream Apply to both feet once daily for 4 weeks.   omeprazole  (PRILOSEC) 20 MG capsule Take 1 capsule (20 mg total) by mouth daily.   ondansetron  (ZOFRAN -ODT) 4 MG disintegrating tablet Take 1 tablet (4 mg total) by mouth every 8 (eight) hours as needed for nausea or vomiting.   polyethylene glycol powder (MIRALAX ) 17 GM/SCOOP powder Take 17 g by mouth daily as needed (constipation).   traMADol  (ULTRAM ) 50 MG tablet Take 1 tablet (50 mg total) by mouth every 6 (six) hours as needed.   vitamin C (ASCORBIC ACID) 500 MG tablet Take 500 mg by mouth daily.    Studies Reviewed: Erin   EKG Interpretation Date/Time:  Monday May 23 2024 08:29:07 EDT Ventricular Rate:  76 PR Interval:  174 QRS Duration:  94 QT Interval:  396 QTC Calculation: 445 R Axis:   -32  Text Interpretation: Sinus rhythm with sinus arrhythmia with frequent Premature ventricular complexes Left axis deviation Minimal voltage criteria for LVH, may be normal variant ( Cornell product ) When compared with ECG of 07-Mar-2024 09:05, No significant change since last tracing Confirmed by Anner Lenis ([id number]) on 05/23/2024 8:45:14 AM    Lab Results  Component Value Date   CHOL 165 01/29/2024   HDL 74 01/29/2024   LDLCALC 80 01/29/2024   LDLDIRECT 98 02/02/2014   TRIG 53 01/29/2024   CHOLHDL 2.2 01/29/2024   Lab Results  Component Value Date   NA 136 03/07/2024   K 4.4 03/07/2024   CREATININE 1.26 (H) 03/07/2024   GFRNONAA 44 (L) 03/07/2024   GLUCOSE 110 (H) 03/07/2024    Lab Results  Component Value Date   HGBA1C 5.8 05/10/2024   Results RADIOLOGY CT Chest, Abdomen, and Pelvis: No acute traumatic injury, mild pulmonary haziness, new enhancing foci in anterior right bladder wall 9x8 mm, suggestive of bladder neoplasm, coronary calcification, residuals from breast surgery (03/07/2024)  DIAGNOSTIC Coronary CTA coronary catheter score 46, mild to moderate plaque in LAD, 25-50% stenosis, other arteries <25% plaque (March 2021) Echocardiogram: Ejection fraction 60-65%, mild impaired relaxation, mild tricuspid regurgitation, trivial mitral regurgitation, normal right ventricle (February 2021)   Risk Assessment/Calculations:            Erin Coffey perioperative risk of a major cardiac event is 0.4% according to the Revised Cardiac Risk Index (RCRI).  Therefore, she is at low risk for perioperative complications.   Her functional capacity is good at 7.25 METs according to the [facility] Activity Status Index (DASI). Recommendations: According to ACC/AHA guidelines, no further cardiovascular testing needed.  The patient  may proceed to surgery at acceptable risk.   Antiplatelet and/or Anticoagulation Recommendations: Not on antiplatelet agent Eliquis  (Apixaban ) can be held for 2-3 days prior to surgery.  Please resume post op when felt to be safe.   Due to PVCs noted on EKG - will chech 3 D Zio Patch Monitor & 2D Echo    Physical Exam:   VS:  BP 136/60   Pulse 63   Resp 16   Ht 5' 8 (1.727 m)   Wt 264 lb 6.4 oz (119.9 kg)   SpO2 97%   BMI 40.20 kg/m    Wt Readings from Last 3 Encounters:  05/23/24 264 lb 6.4 oz (119.9 kg)  05/10/24 269 lb 6 oz (122.2 kg)  04/18/24 268 lb (121.6 kg)      GEN: Well nourished, well developed in no acute distress; morbidly obese - but notably reduced from last visit.  NECK: No JVD; No carotid bruits CARDIAC: Normal S1, S2; RRR - with ectopy; no murmurs, rubs, gallops RESPIRATORY:  Clear to auscultation without rales,  wheezing or rhonchi ; nonlabored, good air movement. ABDOMEN: Soft, non-tender, non-distended EXTREMITIES:  No edema; No deformity     ASSESSMENT AND PLAN: .    Problem List Items Addressed This Visit       Cardiology Problems   Hyperlipidemia associated with type 2 diabetes mellitus (HCC) (Chronic)   Most recent lipid panel showed LDL improved down to 80, and A1c was 5.8. Would like to see LDL little bit lower, but at this point I would not add additional medication. -Continue atorvastatin  40 mg daily, however low threshold to convert to rosuvastatin if were not able to continue to improve her lipids.  Last A1c was 5.8.  Has improved with weight loss.  She is actually not on any medications at this point for glycemic control.  Continue to monitor.      Relevant Orders   ECHOCARDIOGRAM COMPLETE   HYPERTENSION, BENIGN ESSENTIAL (Chronic)   Initial Blood pressure was borderline elevated today, but usually normalizes after resting. On stable regimen for hypertension. - Continued amlodipine  5 mg daily but okay to titrate further if blood pressures do become an issue.. - Continue losartan  100 mg daily along with Lopressor  25 mg twice daily. Not currently on a diuretic, which would be the next class of medications add after titrating amlodipine  further if necessary.      Relevant Orders   ECHOCARDIOGRAM COMPLETE   Mild coronary artery disease - Primary (Chronic)   Atherosclerotic heart disease of native coronary arteries without angina Previous Coronary CTAngiogram in March 2021 showed mild to moderate plaque in the LAD and minimal plaque elsewhere. Asymptomatic with no angina or significant cardiac symptoms.  With no active symptoms, no need for further ischemic evaluation.  CRF modification: Continue amlodipine  5 mg daily, losartan  100 mg daily and Lopressor  25 mg twice daily for blood pressure control along with atorvastatin  40 g daily for lipid management. Not on aspirin  because of  long-term Eliquis . Continue to maintain activity level with weight loss.      Relevant Orders   EKG 12-Lead (Completed)   ECHOCARDIOGRAM COMPLETE   Pulmonary emboli (HCC) (Chronic)   History of PE.  On long-term anticoagulation with Eliquis . - Instruct to hold Eliquis  2-3 days before the procedure, depending on the surgeon's preference. - Restart Eliquis  when deemed safe post-procedure.      Relevant Orders   ECHOCARDIOGRAM COMPLETE   PVC's (premature ventricular contractions)   PVCs noted on  EKG. Concern is to ensure these are not frequent enough to pose a risk during the upcoming bladder procedure. Completely asymptomatic with PVCs noted on EKG. - Order a 3-day Holter monitor to quantify PVCs. - Order an echocardiogram to assess cardiac function.      Relevant Orders   ECHOCARDIOGRAM COMPLETE     Other   Body mass index (BMI) 40.0-44.9, adult (HCC)   Relevant Orders   ECHOCARDIOGRAM COMPLETE   Long term (current) use of anticoagulants (Chronic)   Recurrent PE with initial 1 in September 2012 followed up by another episode in 2020.  She is therefore on long-term DOAC.  Initially on Xarelto now on Eliquis  5 mg twice daily.  She is far enough out from PE that she should be safe holding Eliquis  2 to 3 days preop for surgeries or procedures.  This is not a medication controlled by me but should not be an issue to hold.      Morbid (severe) obesity due to excess calories (HCC) (Chronic)   She has actually lost quite a bit of weight from when I last saw her.  Partly related to her cancer treatments.  She is working hard to keep the weight off and is continuing her exercise and has adjusted her diet. I congratulated her efforts.  She still has weight to go but is hopefully coming down slowly but surely.      Preop cardiovascular exam   Preoperative cardiovascular risk assessment for bladder mass resection Scheduled for bladder mass resection on October 27th. Cardiovascular risk is  low; she performs daily activities without cardiac symptoms. Previous echocardiogram showed normal ejection fraction and mild regurgitation.  Erin Coffey perioperative risk of a major cardiac event is 0.4% according to the Revised Cardiac Risk Index (RCRI).  Therefore, she is at low risk for perioperative complications.   Her functional capacity is good at 7.25 METs according to the Duke Activity Status Index (DASI). Recommendations: According to ACC/AHA guidelines, no further cardiovascular testing needed.  The patient may proceed to surgery at acceptable risk.   Antiplatelet and/or Anticoagulation Recommendations: Not on antiplatelet agent Eliquis  (Apixaban ) can be held for 2-3 days prior to surgery.  Please resume post op when felt to be safe.   Due to PVCs noted on EKG - will chech 3 D Zio Patch Monitor & 2D Echo   In general, she is doing well with no active cardiac symptoms.  I am just interested in her PVCs and want to ensure that there is no new abnormalities so we will evaluate with a Zio patch monitor and echo cardiogram.  She is completely asymptomatic and does not require stress testing unless echocardiogram or monitor shows any gross abnormalities.  - Order a 3-day Holter monitor to assess the frequency of PVCs. - Order an echocardiogram to evaluate cardiac function before the procedure. - Instruct to hold Eliquis  2-3 days before the procedure, depending on the surgeon's preference. - Restart Eliquis  when deemed safe post-procedure.      Relevant Orders   ECHOCARDIOGRAM COMPLETE   Other Visit Diagnoses       History of pulmonary embolism       Relevant Orders   ECHOCARDIOGRAM COMPLETE            Follow-Up: Return in about 4 months (around 09/23/2024) for 3-4 month follow-up, Routine follow up with me.  2    Signed, Alm MICAEL Clay, MD, MS Alm Clay, M.D., M.S. Interventional Cardiologist  [facility] Pager #  [phone] ° °  °  °"

## 2024-05-23 NOTE — Progress Notes (Signed)
 Sent message, via epic in basket, requesting orders in epic from Careers adviser.

## 2024-05-23 NOTE — Progress Notes (Unsigned)
 Zio xt serial [id number] from office inventory applied to patient.

## 2024-05-24 ENCOUNTER — Ambulatory Visit: Payer: Self-pay | Admitting: Urology

## 2024-05-24 DIAGNOSIS — N3289 Other specified disorders of bladder: Secondary | ICD-10-CM

## 2024-05-25 ENCOUNTER — Encounter: Payer: Self-pay | Admitting: Cardiology

## 2024-05-25 NOTE — Assessment & Plan Note (Signed)
 PVCs noted on EKG. Concern is to ensure these are not frequent enough to pose a risk during the upcoming bladder procedure. Completely asymptomatic with PVCs noted on EKG. - Order a 3-day Holter monitor to quantify PVCs. - Order an echocardiogram to assess cardiac function.

## 2024-05-25 NOTE — Assessment & Plan Note (Signed)
 Initial Blood pressure was borderline elevated today, but usually normalizes after resting. On stable regimen for hypertension. - Continued amlodipine  5 mg daily but okay to titrate further if blood pressures do become an issue.. - Continue losartan  100 mg daily along with Lopressor  25 mg twice daily. Not currently on a diuretic, which would be the next class of medications add after titrating amlodipine  further if necessary.

## 2024-05-25 NOTE — Assessment & Plan Note (Signed)
 She has actually lost quite a bit of weight from when I last saw her.  Partly related to her cancer treatments.  She is working hard to keep the weight off and is continuing her exercise and has adjusted her diet. I congratulated her efforts.  She still has weight to go but is hopefully coming down slowly but surely.

## 2024-05-25 NOTE — Assessment & Plan Note (Signed)
 History of PE.  On long-term anticoagulation with Eliquis . - Instruct to hold Eliquis  2-3 days before the procedure, depending on the surgeon's preference. - Restart Eliquis  when deemed safe post-procedure.

## 2024-05-25 NOTE — Assessment & Plan Note (Signed)
 Preoperative cardiovascular risk assessment for bladder mass resection Scheduled for bladder mass resection on October 27th. Cardiovascular risk is low; she performs daily activities without cardiac symptoms. Previous echocardiogram showed normal ejection fraction and mild regurgitation.  Erin Coffey perioperative risk of a major cardiac event is 0.4% according to the Revised Cardiac Risk Index (RCRI).  Therefore, she is at low risk for perioperative complications.   Her functional capacity is good at 7.25 METs according to the Duke Activity Status Index (DASI). Recommendations: According to ACC/AHA guidelines, no further cardiovascular testing needed.  The patient may proceed to surgery at acceptable risk.   Antiplatelet and/or Anticoagulation Recommendations: Not on antiplatelet agent Eliquis  (Apixaban ) can be held for 2-3 days prior to surgery.  Please resume post op when felt to be safe.   Due to PVCs noted on EKG - will chech 3 D Zio Patch Monitor & 2D Echo   In general, she is doing well with no active cardiac symptoms.  I am just interested in her PVCs and want to ensure that there is no new abnormalities so we will evaluate with a Zio patch monitor and echo cardiogram.  She is completely asymptomatic and does not require stress testing unless echocardiogram or monitor shows any gross abnormalities.  - Order a 3-day Holter monitor to assess the frequency of PVCs. - Order an echocardiogram to evaluate cardiac function before the procedure. - Instruct to hold Eliquis  2-3 days before the procedure, depending on the surgeon's preference. - Restart Eliquis  when deemed safe post-procedure.

## 2024-05-25 NOTE — Assessment & Plan Note (Addendum)
 Most recent lipid panel showed LDL improved down to 80, and A1c was 5.8. Would like to see LDL little bit lower, but at this point I would not add additional medication. -Continue atorvastatin  40 mg daily, however low threshold to convert to rosuvastatin if were not able to continue to improve her lipids.  Last A1c was 5.8.  Has improved with weight loss.  She is actually not on any medications at this point for glycemic control.  Continue to monitor.

## 2024-05-25 NOTE — Assessment & Plan Note (Addendum)
 Atherosclerotic heart disease of native coronary arteries without angina Previous Coronary CTAngiogram in March 2021 showed mild to moderate plaque in the LAD and minimal plaque elsewhere. Asymptomatic with no angina or significant cardiac symptoms.  With no active symptoms, no need for further ischemic evaluation.  CRF modification: Continue amlodipine  5 mg daily, losartan  100 mg daily and Lopressor  25 mg twice daily for blood pressure control along with atorvastatin  40 g daily for lipid management. Not on aspirin  because of long-term Eliquis . Continue to maintain activity level with weight loss.

## 2024-05-25 NOTE — Assessment & Plan Note (Signed)
 Recurrent PE with initial 1 in September 2012 followed up by another episode in 2020.  She is therefore on long-term DOAC.  Initially on Xarelto now on Eliquis  5 mg twice daily.  She is far enough out from PE that she should be safe holding Eliquis  2 to 3 days preop for surgeries or procedures.  This is not a medication controlled by me but should not be an issue to hold.

## 2024-05-26 ENCOUNTER — Telehealth: Payer: Self-pay

## 2024-05-26 NOTE — Telephone Encounter (Signed)
 Patient calls nurse line wanting to make PCP aware of upcoming surgery.   She is scheduled for surgery on 10/27.  Advised will forward to PCP.

## 2024-05-27 ENCOUNTER — Ambulatory Visit (INDEPENDENT_AMBULATORY_CARE_PROVIDER_SITE_OTHER)

## 2024-05-27 DIAGNOSIS — Z86711 Personal history of pulmonary embolism: Secondary | ICD-10-CM

## 2024-05-27 DIAGNOSIS — I2510 Atherosclerotic heart disease of native coronary artery without angina pectoris: Secondary | ICD-10-CM | POA: Diagnosis not present

## 2024-05-27 DIAGNOSIS — I493 Ventricular premature depolarization: Secondary | ICD-10-CM | POA: Diagnosis not present

## 2024-05-27 DIAGNOSIS — I2699 Other pulmonary embolism without acute cor pulmonale: Secondary | ICD-10-CM | POA: Diagnosis not present

## 2024-05-27 DIAGNOSIS — E785 Hyperlipidemia, unspecified: Secondary | ICD-10-CM

## 2024-05-27 DIAGNOSIS — Z01810 Encounter for preprocedural cardiovascular examination: Secondary | ICD-10-CM

## 2024-05-27 DIAGNOSIS — I10 Essential (primary) hypertension: Secondary | ICD-10-CM | POA: Diagnosis not present

## 2024-05-27 DIAGNOSIS — E1169 Type 2 diabetes mellitus with other specified complication: Secondary | ICD-10-CM | POA: Diagnosis not present

## 2024-05-27 DIAGNOSIS — Z6841 Body Mass Index (BMI) 40.0 and over, adult: Secondary | ICD-10-CM

## 2024-05-27 LAB — ECHOCARDIOGRAM COMPLETE
AR max vel: 1.67 cm2
AV Area VTI: 1.85 cm2
AV Area mean vel: 1.66 cm2
AV Mean grad: 4 mmHg
AV Peak grad: 9.5 mmHg
AV Vena cont: 0.23 cm
Ao pk vel: 1.54 m/s
Area-P 1/2: 3.21 cm2
P 1/2 time: 559 ms
S' Lateral: 3.79 cm

## 2024-05-27 NOTE — Patient Instructions (Signed)
 SURGICAL WAITING ROOM VISITATION Patients having surgery or a procedure may have no more than 2 support people in the waiting area - these visitors may rotate in the visitor waiting room.   Due to an increase in RSV and influenza rates and associated hospitalizations, children ages 80 and under may not visit patients in [facility] hospitals. If the patient needs to stay at the hospital during part of their recovery, the visitor guidelines for inpatient rooms apply.  PRE-OP VISITATION  Pre-op nurse will coordinate an appropriate time for 1 support person to accompany the patient in pre-op.  This support person may not rotate.  This visitor will be contacted when the time is appropriate for the visitor to come back in the pre-op area.  Please refer to the [facility] website for the visitor guidelines for Inpatients (after your surgery is over and you are in a regular room).  You are not required to quarantine at this time prior to your surgery. However, you must do this: Hand Hygiene often Do NOT share personal items Notify your provider if you are in close contact with someone who has COVID or you develop fever 100.4 or greater, new onset of sneezing, cough, sore throat, shortness of breath or body aches.  If you test positive for Covid or have been in contact with anyone that has tested positive in the last 10 days please notify you surgeon.    Your procedure is scheduled on:  06/06/24  Report to [facility] Main Entrance: [place] entrance where the [employer] is available.   Report to admitting at:5:15 AM  Call this number if you have any questions or problems the morning of surgery [phone]  FOLLOW ANY ADDITIONAL PRE OP INSTRUCTIONS YOU RECEIVED FROM YOUR SURGEON'S OFFICE!!!  Do not eat food or drink fluids after Midnight the night prior to your surgery/procedure.   Oral Hygiene is also important to reduce your risk of infection.        Remember - BRUSH YOUR TEETH  THE MORNING OF SURGERY WITH YOUR REGULAR TOOTHPASTE  Do NOT smoke after Midnight the night before surgery.  STOP TAKING all Vitamins, Herbs and supplements 1 week before your surgery.   Take ONLY these medicines the morning of surgery with A SIP OF WATER: anastrozole ,metoprolol ,omeprazole .  If You have been diagnosed with Sleep Apnea - Bring CPAP mask and tubing day of surgery. We will provide you with a CPAP machine on the day of your surgery.                   You may not have any metal on your body including hair pins, jewelry, and body piercing  Do not wear make-up, lotions, powders, perfumes / cologne, or deodorant  Do not wear nail polish including gel and S&S, artificial / acrylic nails, or any other type of covering on natural nails including finger and toenails. If you have artificial nails, gel coating, etc., that needs to be removed by a nail salon, Please have this removed prior to surgery. Not doing so may mean that your surgery could be cancelled or delayed if the Surgeon or anesthesia staff feels like they are unable to monitor you safely.   Do not shave 48 hours prior to surgery to avoid nicks in your skin which may contribute to postoperative infections.   Contacts, Hearing Aids, dentures or bridgework may not be worn into surgery. DENTURES WILL BE REMOVED PRIOR TO SURGERY PLEASE DO NOT APPLY Poly grip  OR ADHESIVES!!!  You may bring a small overnight bag with you on the day of surgery, only pack items that are not valuable. [facility] IS NOT RESPONSIBLE   FOR VALUABLES THAT ARE LOST OR STOLEN.   Patients discharged on the day of surgery will not be allowed to drive home.  Someone NEEDS to stay with you for the first 24 hours after anesthesia.  Do not bring your home medications to the hospital. The Pharmacy will dispense medications listed on your medication list to you during your admission in the Hospital.  Special Instructions: Bring a copy of your healthcare power of  attorney and living will documents the day of surgery, if you wish to have them scanned into your [facility] Records- EPIC  Please read over the following fact sheets you were given: IF YOU HAVE QUESTIONS ABOUT YOUR PRE-OP INSTRUCTIONS, PLEASE CALL [phone]   [facility] - Preparing for Surgery      Before surgery, you can play an important role.  Because skin is not sterile, your skin needs to be as free of germs as possible.  You can reduce the number of germs on your skin by washing with CHG (chlorahexidine gluconate) soap before surgery.  CHG is an antiseptic cleaner which kills germs and bonds with the skin to continue killing germs even after washing. Please DO NOT use if you have an allergy to CHG or antibacterial soaps.  If your skin becomes reddened/irritated stop using the CHG and inform your nurse when you arrive at Short Stay. Do not shave (including legs and underarms) for at least 48 hours prior to the first CHG shower.  You may shave your face/neck.  Please follow these instructions carefully:  1.  Shower with CHG Soap the night before surgery ONLY (DO NOT USE THE SOAP THE MORNING OF SURGERY).  2.  If you choose to wash your hair, wash your hair first as usual with your normal  shampoo.  3.  After you shampoo, rinse your hair and body thoroughly to remove the shampoo.                             4.  Use CHG as you would any other liquid soap.  You can apply chg directly to the skin and wash.  Gently with a scrungie or clean washcloth.  5.  Apply the CHG Soap to your body ONLY FROM THE NECK DOWN.   Do not use on face/ open                           Wound or open sores. Avoid contact with eyes, ears mouth and genitals (private parts).                       Wash face,  Genitals (private parts) with your normal soap.             6.  Wash thoroughly, paying special attention to the area where your  surgery  will be performed.  7.  Thoroughly rinse your body with warm water  from the neck down.  8.  DO NOT shower/wash with your normal soap after using and rinsing off the CHG Soap.                9.  Pat yourself dry with a clean towel.  10.  Wear clean pajamas.            11.  Place clean sheets on your bed the night of your first shower and do not  sleep with pets.  Day of Surgery : Do not apply any CHG, lotions/deodorants the morning of surgery.  Please wear clean clothes to the hospital/surgery center.   FAILURE TO FOLLOW THESE INSTRUCTIONS MAY RESULT IN THE CANCELLATION OF YOUR SURGERY  PATIENT SIGNATURE_________________________________  NURSE SIGNATURE__________________________________  ________________________________________________________________________

## 2024-05-29 ENCOUNTER — Ambulatory Visit: Payer: Self-pay | Admitting: Cardiology

## 2024-05-30 ENCOUNTER — Encounter (HOSPITAL_COMMUNITY)
Admission: RE | Admit: 2024-05-30 | Discharge: 2024-05-30 | Disposition: A | Discharge status: Home / Self Care | Source: Ambulatory Visit | Attending: Urology | Admitting: Urology

## 2024-05-30 ENCOUNTER — Telehealth: Payer: Self-pay | Admitting: Hematology and Oncology

## 2024-05-30 ENCOUNTER — Encounter (HOSPITAL_COMMUNITY): Payer: Self-pay

## 2024-05-30 ENCOUNTER — Other Ambulatory Visit: Payer: Self-pay

## 2024-05-30 VITALS — BP 155/58 | HR 60 | Temp 98.7°F | Ht 68.0 in | Wt 267.0 lb

## 2024-05-30 DIAGNOSIS — Z86718 Personal history of other venous thrombosis and embolism: Secondary | ICD-10-CM | POA: Insufficient documentation

## 2024-05-30 DIAGNOSIS — I129 Hypertensive chronic kidney disease with stage 1 through stage 4 chronic kidney disease, or unspecified chronic kidney disease: Secondary | ICD-10-CM | POA: Insufficient documentation

## 2024-05-30 DIAGNOSIS — N329 Bladder disorder, unspecified: Secondary | ICD-10-CM | POA: Insufficient documentation

## 2024-05-30 DIAGNOSIS — Z86018 Personal history of other benign neoplasm: Secondary | ICD-10-CM | POA: Diagnosis not present

## 2024-05-30 DIAGNOSIS — Z860101 Personal history of adenomatous and serrated colon polyps: Secondary | ICD-10-CM | POA: Diagnosis not present

## 2024-05-30 DIAGNOSIS — I10 Essential (primary) hypertension: Secondary | ICD-10-CM

## 2024-05-30 DIAGNOSIS — E119 Type 2 diabetes mellitus without complications: Secondary | ICD-10-CM

## 2024-05-30 DIAGNOSIS — E1122 Type 2 diabetes mellitus with diabetic chronic kidney disease: Secondary | ICD-10-CM | POA: Diagnosis not present

## 2024-05-30 DIAGNOSIS — N189 Chronic kidney disease, unspecified: Secondary | ICD-10-CM | POA: Diagnosis not present

## 2024-05-30 DIAGNOSIS — Z01818 Encounter for other preprocedural examination: Secondary | ICD-10-CM | POA: Diagnosis not present

## 2024-05-30 HISTORY — DX: Malignant (primary) neoplasm, unspecified: C80.1

## 2024-05-30 HISTORY — DX: Cardiac murmur, unspecified: R01.1

## 2024-05-30 LAB — CBC
HCT: 34.8 % — ABNORMAL LOW (ref 36.0–46.0)
Hemoglobin: 11.1 g/dL — ABNORMAL LOW (ref 12.0–15.0)
MCH: 29.8 pg (ref 26.0–34.0)
MCHC: 31.9 g/dL (ref 30.0–36.0)
MCV: 93.3 fL (ref 80.0–100.0)
Platelets: 182 K/uL (ref 150–400)
RBC: 3.73 MIL/uL — ABNORMAL LOW (ref 3.87–5.11)
RDW: 15 % (ref 11.5–15.5)
WBC: 3.7 K/uL — ABNORMAL LOW (ref 4.0–10.5)
nRBC: 0 % (ref 0.0–0.2)

## 2024-05-30 LAB — HEMOGLOBIN A1C
Hgb A1c MFr Bld: 5.8 % — ABNORMAL HIGH (ref 4.8–5.6)
Mean Plasma Glucose: 119.76 mg/dL

## 2024-05-30 LAB — BASIC METABOLIC PANEL WITH GFR
Anion gap: 10 (ref 5–15)
BUN: 23 mg/dL (ref 8–23)
CO2: 23 mmol/L (ref 22–32)
Calcium: 10.1 mg/dL (ref 8.9–10.3)
Chloride: 107 mmol/L (ref 98–111)
Creatinine, Ser: 1.28 mg/dL — ABNORMAL HIGH (ref 0.44–1.00)
GFR, Estimated: 43 mL/min — ABNORMAL LOW (ref >60)
Glucose, Bld: 120 mg/dL — ABNORMAL HIGH (ref 70–99)
Potassium: 4.2 mmol/L (ref 3.5–5.1)
Sodium: 139 mmol/L (ref 135–145)

## 2024-05-30 LAB — GLUCOSE, CAPILLARY: Glucose-Capillary: 121 mg/dL — ABNORMAL HIGH (ref 70–99)

## 2024-05-30 NOTE — Progress Notes (Signed)
 For Anesthesia: PCP - [name], DO  Cardiologist - Anner Alm ORN, MD . ARNETTA: 05/23/24  Bowel Prep reminder:  Chest x-ray - CT chest: 03/07/24 EKG - 05/23/24 Stress Test -  ECHO -  Cardiac Cath - 04/29/11 Pacemaker/ICD device last checked: Pacemaker orders received: Device Rep notified:  Spinal Cord Stimulator:N/A  Sleep Study - N/A CPAP -   Fasting Blood Sugar - N/A Checks Blood Sugar __0___ times a day Date and result of last Hgb A1c-5.8: 01/29/24  Last dose of GLP1 agonist-  GLP1 instructions: Hold 7 days prior to schedule (Hold 24 hours-daily)   Last dose of SGLT-2 inhibitors-  SGLT-2 instructions: Hold 72 hours prior to surgery  Blood Thinner Instructions: Eliquis  will be on hold after: 05/23/24 as per Dr. Genice instructions. Last Dose: Time last taken:  Aspirin  Instructions: Last Dose: Time last taken:  Activity level:       Unable to go up a flight of stairs without shortness of breath    Anesthesia review:Hx: HTN,CAD,DVT,PE,DIA,CKD,PVC's.   Patient denies shortness of breath, fever, cough and chest pain at PAT appointment   Patient verbalized understanding of instructions that were reviewed over the telephone.

## 2024-05-30 NOTE — Telephone Encounter (Signed)
 Left voicemail for patient regarding rescheduled appointment from 06/27/2024 to 06/28/2024.

## 2024-05-30 NOTE — Progress Notes (Signed)
 Last read by Othel CHRISTELLA Silvan at 5:12PM on 05/29/2024.

## 2024-05-31 NOTE — Progress Notes (Signed)
 Anesthesia Chart Review   Case: [id number] Date/Time: 06/06/24 0715   Procedures:      CYSTOSCOPY     TURBT, WITH CHEMOTHERAPEUTIC AGENT INSTILLATION INTO BLADDER     INSTILLATION, BLADDER - gemcitabine installation   Anesthesia type: General   Diagnosis: Bladder mass [N32.89]   Pre-op diagnosis: Bladder Mass   Location: [facility] PROCEDURE [facility] / [facility]   Surgeons: Matilda Senior, MD       DISCUSSION:78 y.o. never smoker with h/o HTN, PE, DVT, CKD, DM II, pituitary adenoma, right breast cancer, bladder mass scheduled for above procedure 06/06/24 with Dr. Senior Matilda.   Per cardiology preoperative evaluation 05/23/2024, Erin Coffey's perioperative risk of a major cardiac event is 0.4% according to the Revised Cardiac Risk Index (RCRI).  Therefore, she is at low risk for perioperative complications.   Her functional capacity is good at 7.25 METs according to the Duke Activity Status Index (DASI). Recommendations: According to ACC/AHA guidelines, no further cardiovascular testing needed.  The patient may proceed to surgery at acceptable risk.   Antiplatelet and/or Anticoagulation Recommendations: Not on antiplatelet agent Eliquis  (Apixaban ) can be held for 2-3 days prior to surgery.  Please resume post op when felt to be safe.   Due to PVCs noted on EKG - will chech 3 D Zio Patch Monitor & 2D Echo   Echo 05/27/2024 with EF 60-65%, no valvular problems.  Per results notes, Based on this result, should be fine to proceed with her urologic procedure/surgery that is planned. VS: BP (!) 155/58   Pulse 60   Temp 37.1 C (Oral)   Ht 5' 8 (1.727 m)   Wt 121.1 kg   SpO2 100%   BMI 40.60 kg/m   PROVIDERS: [name], DO is PCP    Anner Lenis, MD is Cardiologist  LABS: Labs reviewed: Acceptable for surgery. (all labs ordered are listed, but only abnormal results are displayed)  Labs Reviewed  HEMOGLOBIN A1C - Abnormal; Notable for the following components:      Result  Value   Hgb A1c MFr Bld 5.8 (*)    All other components within normal limits  BASIC METABOLIC PANEL WITH GFR - Abnormal; Notable for the following components:   Glucose, Bld 120 (*)    Creatinine, Ser 1.28 (*)    GFR, Estimated 43 (*)    All other components within normal limits  CBC - Abnormal; Notable for the following components:   WBC 3.7 (*)    RBC 3.73 (*)    Hemoglobin 11.1 (*)    HCT 34.8 (*)    All other components within normal limits  GLUCOSE, CAPILLARY - Abnormal; Notable for the following components:   Glucose-Capillary 121 (*)    All other components within normal limits     IMAGES:   EKG:   CV: Echo 05/27/2024 1. Frequent PVCs throughout exam. Left ventricular ejection fraction, by  estimation, is 60 to 65%. Left ventricular ejection fraction by 3D volume  is 73 %. The left ventricle has normal function. The left ventricle has no  regional wall motion  abnormalities. The left ventricular internal cavity size was mildly  dilated. There is mild concentric left ventricular hypertrophy. Left  ventricular diastolic parameters are consistent with Grade I diastolic  dysfunction (impaired relaxation). The average   left ventricular global longitudinal strain is -17.8 %. The global  longitudinal strain is normal.   2. Right ventricular systolic function is normal. The right ventricular  size is normal. There is normal  pulmonary artery systolic pressure.   3. Left atrial size was mildly dilated.   4. A small pericardial effusion is present.   5. The mitral valve is normal in structure. Mild mitral valve  regurgitation. No evidence of mitral stenosis.   6. The aortic valve is tricuspid. Aortic valve regurgitation is mild. No  aortic stenosis is present.   7. The inferior vena cava is normal in size with greater than 50%  respiratory variability, suggesting right atrial pressure of 3 mmHg.  Past Medical History:  Diagnosis Date   Allergy    Anemia, iron   deficiency    ANXIETY, SITUATIONAL 04/06/2009   Qualifier: Diagnosis of  By: [place]  MD, Stephanie     Arthritis    Back pain, thoracic 12/20/2012   Cancer (HCC)    right breast   Carpal tunnel syndrome 12/07/2014   Cholelithiasis 06/25/2012   Chronic kidney disease    DM type 2 goal A1C below 7.5 06/05/2008   Qualifier: Diagnosis of  By: [place]  MD, Corean     Family history of breast cancer 03/05/2022   GASTROESOPHAGEAL REFLUX, NO ESOPHAGITIS 10/08/2006   Qualifier: Diagnosis of  By: [place] Rocky Saliva, unspecified 10/21/2007   Qualifier: Diagnosis of  By: ROJELIO MD, [name]     Heart murmur    History of DVT (deep vein thrombosis) 05/05/2013   on Eliquis    HYPERLIPIDEMIA 08/08/2008   Qualifier: Diagnosis of  By: [place]  MD, Stephanie     HYPERTENSION, BENIGN ESSENTIAL 11/27/2006   Qualifier: Diagnosis of  ByBETHA ROJELIO MD, [name]     Mild coronary artery disease 10/2019   Coronary CT Angiogram: Coronary calcium  score 46.Normal coronary origin.  Left dominance with tortuous coronary arteries.  Nondominant RCA - prox < 25% followed by ectasia (dilated to 5.5 mm) mixed plaque in distal vessel.  LM- normal . LAD: minimal mixed plaque in ostial & proximal (<25%) with brief IM bridging in mLAD. Mild mid-distal (25-49%), RI - prox <25%, Large LCx- minimal (<25%) mid-dista   Mild coronary artery disease    Personal history of colonic polyps 10/24/2009   Qualifier: Diagnosis of  By: Debrah MD, Lamar BIRCH    Pica 08/08/2009   Qualifier: Diagnosis of  By: [place], [name]     Pituitary adenoma [facility])    Polymyalgia rheumatica 10/02/2009   Qualifier: Diagnosis of  By: Rickard MD, Vernell     Pulmonary embolism [facility]) - On long Term anticoagulation (Z79.01) 04/28/2011   Converted from warfarin to Eliquis ; on long-term anticoagulation.   Thyroid  disease    Trigger ring finger of left hand 11/02/2014    Past Surgical History:  Procedure Laterality Date   ABDOMINAL HYSTERECTOMY     BREAST LUMPECTOMY  WITH RADIOACTIVE SEED LOCALIZATION Right 03/25/2022   Procedure: RIGHT BREAST SEED LUMPECTOMY;  Surgeon: Vanderbilt Ned, MD;  Location: [facility];  Service: General;  Laterality: Right;   CARDIAC CATHETERIZATION  04/29/2011   (Dr. Morris - [facility]): LM - normal. LAD with 1 major Diag - normal, bifurcating Ramus - normal, Large (6-7 mm) Dominant LCx with 1 OM, 2 LPL branches & LPDA - non significant disease; small non-dominant RCA.     CARDIAC EVENT MONITOR  09/2019   Mostly sinus rhythm, average rate 60 bpm.  Range 45 to 190 bpm.  No critical events indicating any arrhythmias or pauses.  For patient responses noted PVCs.  Less than 1% PVCs noted.    CARPAL TUNNEL  RELEASE Right 1996   COLONOSCOPY     KNEE ARTHROSCOPY  Bilateral   masctomy     TRANSTHORACIC ECHOCARDIOGRAM  09/2019   Normal EF - 60 to 65%.  Mild LVH.  GR 1 DD.  Normal RV.  Trivial MR.  Normal IVC.  Normal PA pressures.   TUBAL LIGATION     UPPER GASTROINTESTINAL ENDOSCOPY      MEDICATIONS:  acetaminophen  (TYLENOL ) 650 MG CR tablet   alendronate  (FOSAMAX ) 70 MG tablet   amLODipine  (NORVASC ) 5 MG tablet   anastrozole  (ARIMIDEX ) 1 MG tablet   apixaban  (ELIQUIS ) 5 MG TABS tablet   atorvastatin  (LIPITOR) 40 MG tablet   baclofen  (LIORESAL ) 10 MG tablet   BLACK CURRANT SEED OIL PO   Cholecalciferol 50 MCG (2000 UT) TABS   diclofenac  Sodium (VOLTAREN ) 1 % GEL   Ferrous Sulfate (IRON  PO)   fish oil-omega-3 fatty acids 1000 MG capsule   ketoconazole  (NIZORAL ) 2 % cream   losartan  (COZAAR ) 100 MG tablet   metoprolol  tartrate (LOPRESSOR ) 25 MG tablet   omeprazole  (PRILOSEC) 20 MG capsule   ondansetron  (ZOFRAN -ODT) 4 MG disintegrating tablet   polyethylene glycol powder (MIRALAX ) 17 GM/SCOOP powder   traMADol  (ULTRAM ) 50 MG tablet   vitamin C (ASCORBIC ACID) 500 MG tablet    ketoconazole  (NIZORAL ) 2 % cream    Marshaun Lortie Ward, PA-C [facility] Pre-Surgical Testing [phone]

## 2024-05-31 NOTE — Anesthesia Preprocedure Evaluation (Addendum)
 Anesthesia Evaluation  Patient identified by MRN, date of birth, ID band Patient awake    Reviewed: Allergy & Precautions, NPO status , Patient's Chart, lab work & pertinent test results, reviewed documented beta blocker date and time   History of Anesthesia Complications Negative for: history of anesthetic complications  Airway Mallampati: III  TM Distance: >3 FB Neck ROM: Full    Dental  (+) Edentulous Lower, Edentulous Upper   Pulmonary PE (2012, on Eliquis )   Pulmonary exam normal        Cardiovascular hypertension, Pt. on medications and Pt. on home beta blockers + CAD  Normal cardiovascular exam  TTE 05/27/24: EF 60-65%, mild LVE, mild LVH, grade 1 DD, mild LAE, mild MR, mild AI   Neuro/Psych   Anxiety     negative neurological ROS     GI/Hepatic Neg liver ROS,GERD  Medicated and Controlled,,  Endo/Other  diabetes, Type 2  Class 3 obesity  Renal/GU Renal InsufficiencyRenal disease   Bladder mass    Musculoskeletal  (+) Arthritis ,  Polymyalgia rheumatica   Abdominal   Peds  Hematology  (+) Blood dyscrasia (Hgb 11.1), anemia   Anesthesia Other Findings   Reproductive/Obstetrics                              Anesthesia Physical Anesthesia Plan  ASA: 3  Anesthesia Plan: General   Post-op Pain Management: Tylenol  PO (pre-op)*   Induction: Intravenous  PONV Risk Score and Plan: 3 and Treatment may vary due to age or medical condition, Ondansetron  and Dexamethasone   Airway Management Planned: LMA  Additional Equipment: None  Intra-op Plan:   Post-operative Plan: Extubation in OR  Informed Consent: I have reviewed the patients History and Physical, chart, labs and discussed the procedure including the risks, benefits and alternatives for the proposed anesthesia with the patient or authorized representative who has indicated his/her understanding and acceptance.      Dental advisory given  Plan Discussed with: CRNA  Anesthesia Plan Comments: (See PAT note 05/30/2024)         Anesthesia Quick Evaluation

## 2024-06-01 ENCOUNTER — Ambulatory Visit: Admitting: Urology

## 2024-06-01 VITALS — BP 151/75 | HR 51 | Ht 68.0 in | Wt 268.0 lb

## 2024-06-01 DIAGNOSIS — D494 Neoplasm of unspecified behavior of bladder: Secondary | ICD-10-CM

## 2024-06-01 DIAGNOSIS — N3289 Other specified disorders of bladder: Secondary | ICD-10-CM

## 2024-06-01 LAB — MICROSCOPIC EXAMINATION

## 2024-06-01 LAB — URINALYSIS, ROUTINE W REFLEX MICROSCOPIC
Bilirubin, UA: NEGATIVE
Glucose, UA: NEGATIVE
Ketones, UA: NEGATIVE
Nitrite, UA: NEGATIVE
Specific Gravity, UA: 1.01 (ref 1.005–1.030)
Urobilinogen, Ur: 0.2 mg/dL (ref 0.2–1.0)
pH, UA: 6.5 (ref 5.0–7.5)

## 2024-06-02 DIAGNOSIS — I493 Ventricular premature depolarization: Secondary | ICD-10-CM | POA: Diagnosis not present

## 2024-06-02 DIAGNOSIS — E785 Hyperlipidemia, unspecified: Secondary | ICD-10-CM | POA: Diagnosis not present

## 2024-06-06 ENCOUNTER — Encounter (HOSPITAL_COMMUNITY): Payer: Self-pay | Admitting: Urology

## 2024-06-06 ENCOUNTER — Ambulatory Visit (HOSPITAL_COMMUNITY)
Admission: RE | Admit: 2024-06-06 | Discharge: 2024-06-06 | Disposition: A | Discharge status: Home / Self Care | Source: Ambulatory Visit | Attending: Urology | Admitting: Urology

## 2024-06-06 ENCOUNTER — Other Ambulatory Visit: Payer: Self-pay

## 2024-06-06 ENCOUNTER — Ambulatory Visit (HOSPITAL_COMMUNITY): Payer: Self-pay | Admitting: Physician Assistant

## 2024-06-06 ENCOUNTER — Ambulatory Visit (HOSPITAL_BASED_OUTPATIENT_CLINIC_OR_DEPARTMENT_OTHER): Admitting: Anesthesiology

## 2024-06-06 ENCOUNTER — Encounter (HOSPITAL_COMMUNITY)
Admission: RE | Disposition: A | Discharge status: Home / Self Care | Payer: Self-pay | Source: Ambulatory Visit | Attending: Urology

## 2024-06-06 DIAGNOSIS — E1122 Type 2 diabetes mellitus with diabetic chronic kidney disease: Secondary | ICD-10-CM | POA: Diagnosis not present

## 2024-06-06 DIAGNOSIS — I2510 Atherosclerotic heart disease of native coronary artery without angina pectoris: Secondary | ICD-10-CM | POA: Diagnosis not present

## 2024-06-06 DIAGNOSIS — C679 Malignant neoplasm of bladder, unspecified: Secondary | ICD-10-CM | POA: Insufficient documentation

## 2024-06-06 DIAGNOSIS — D649 Anemia, unspecified: Secondary | ICD-10-CM | POA: Diagnosis not present

## 2024-06-06 DIAGNOSIS — D494 Neoplasm of unspecified behavior of bladder: Secondary | ICD-10-CM

## 2024-06-06 DIAGNOSIS — N1831 Chronic kidney disease, stage 3a: Secondary | ICD-10-CM | POA: Diagnosis not present

## 2024-06-06 DIAGNOSIS — Z79811 Long term (current) use of aromatase inhibitors: Secondary | ICD-10-CM | POA: Diagnosis not present

## 2024-06-06 DIAGNOSIS — I110 Hypertensive heart disease with heart failure: Secondary | ICD-10-CM | POA: Diagnosis not present

## 2024-06-06 DIAGNOSIS — Z79899 Other long term (current) drug therapy: Secondary | ICD-10-CM | POA: Diagnosis not present

## 2024-06-06 DIAGNOSIS — Z7983 Long term (current) use of bisphosphonates: Secondary | ICD-10-CM | POA: Diagnosis not present

## 2024-06-06 DIAGNOSIS — N189 Chronic kidney disease, unspecified: Secondary | ICD-10-CM | POA: Diagnosis not present

## 2024-06-06 DIAGNOSIS — N3289 Other specified disorders of bladder: Secondary | ICD-10-CM | POA: Diagnosis present

## 2024-06-06 DIAGNOSIS — M353 Polymyalgia rheumatica: Secondary | ICD-10-CM | POA: Diagnosis not present

## 2024-06-06 DIAGNOSIS — I129 Hypertensive chronic kidney disease with stage 1 through stage 4 chronic kidney disease, or unspecified chronic kidney disease: Secondary | ICD-10-CM | POA: Diagnosis not present

## 2024-06-06 HISTORY — PX: CYSTOSCOPY: SHX5120

## 2024-06-06 HISTORY — PX: BLADDER INSTILLATION: SHX6893

## 2024-06-06 LAB — GLUCOSE, CAPILLARY
Glucose-Capillary: 76 mg/dL (ref 70–99)
Glucose-Capillary: 115 mg/dL — ABNORMAL HIGH (ref 70–99)

## 2024-06-06 SURGERY — CYSTOSCOPY
Anesthesia: General

## 2024-06-06 MED ORDER — ONDANSETRON HCL 4 MG/2ML IJ SOLN
INTRAMUSCULAR | Status: AC
  Filled 2024-06-06: qty 2

## 2024-06-06 MED ORDER — SODIUM CHLORIDE 0.9 % IR SOLN
Status: DC | PRN
  Administered 2024-06-06: 3000 mL via INTRAVESICAL

## 2024-06-06 MED ORDER — LIDOCAINE HCL (PF) 2 % IJ SOLN
INTRAMUSCULAR | Status: AC
Start: 2024-06-06 — End: 2024-06-06
  Filled 2024-06-06: qty 5

## 2024-06-06 MED ORDER — PROPOFOL 10 MG/ML IV BOLUS
INTRAVENOUS | Status: AC
  Filled 2024-06-06: qty 20

## 2024-06-06 MED ORDER — CEPHALEXIN 500 MG PO CAPS: 500.0000 mg | ORAL_CAPSULE | Freq: Two times a day (BID) | ORAL | 0 refills | Status: DC

## 2024-06-06 MED ORDER — DROPERIDOL 2.5 MG/ML IJ SOLN: 0.6250 mg | Freq: Once | INTRAMUSCULAR | Status: DC | PRN

## 2024-06-06 MED ORDER — FENTANYL CITRATE (PF) 50 MCG/ML IJ SOSY
25.0000 ug | PREFILLED_SYRINGE | INTRAMUSCULAR | Status: DC | PRN
  Administered 2024-06-06: 50 ug via INTRAVENOUS

## 2024-06-06 MED ORDER — PROPOFOL 500 MG/50ML IV EMUL
INTRAVENOUS | Status: DC | PRN
  Administered 2024-06-06: 30 mg via INTRAVENOUS
  Administered 2024-06-06: 170 mg via INTRAVENOUS
  Administered 2024-06-06: 60 mg via INTRAVENOUS
  Administered 2024-06-06: 40 mg via INTRAVENOUS

## 2024-06-06 MED ORDER — GEMCITABINE CHEMO FOR BLADDER INSTILLATION 2000 MG
2000.0000 mg | Freq: Once | INTRAVENOUS | Status: AC
  Administered 2024-06-06: 2000 mg via INTRAVESICAL
  Filled 2024-06-06: qty 2000

## 2024-06-06 MED ORDER — ORAL CARE MOUTH RINSE: 15.0000 mL | Freq: Once | OROMUCOSAL | Status: AC

## 2024-06-06 MED ORDER — CHLORHEXIDINE GLUCONATE 0.12 % MT SOLN
15.0000 mL | Freq: Once | OROMUCOSAL | Status: AC
  Administered 2024-06-06: 15 mL via OROMUCOSAL

## 2024-06-06 MED ORDER — ROCURONIUM BROMIDE 10 MG/ML (PF) SYRINGE
PREFILLED_SYRINGE | INTRAVENOUS | Status: DC | PRN
  Administered 2024-06-06: 50 mg via INTRAVENOUS

## 2024-06-06 MED ORDER — INSULIN ASPART 100 UNIT/ML IJ SOLN: 0.0000 [IU] | INTRAMUSCULAR | Status: DC | PRN

## 2024-06-06 MED ORDER — ACETAMINOPHEN 500 MG PO TABS
1000.0000 mg | ORAL_TABLET | Freq: Once | ORAL | Status: AC
  Administered 2024-06-06: 1000 mg via ORAL
  Filled 2024-06-06: qty 2

## 2024-06-06 MED ORDER — LIDOCAINE HCL (PF) 2 % IJ SOLN
INTRAMUSCULAR | Status: DC | PRN
  Administered 2024-06-06: 100 mg via INTRADERMAL

## 2024-06-06 MED ORDER — DEXAMETHASONE SOD PHOSPHATE PF 10 MG/ML IJ SOLN
INTRAMUSCULAR | Status: DC | PRN
  Administered 2024-06-06: 5 mg via INTRAVENOUS

## 2024-06-06 MED ORDER — FENTANYL CITRATE (PF) 100 MCG/2ML IJ SOLN
INTRAMUSCULAR | Status: DC | PRN
  Administered 2024-06-06: 25 ug via INTRAVENOUS

## 2024-06-06 MED ORDER — OXYCODONE HCL 5 MG/5ML PO SOLN: 5.0000 mg | Freq: Once | ORAL | Status: DC | PRN

## 2024-06-06 MED ORDER — FENTANYL CITRATE (PF) 50 MCG/ML IJ SOSY
PREFILLED_SYRINGE | INTRAMUSCULAR | Status: AC
  Filled 2024-06-06: qty 1

## 2024-06-06 MED ORDER — LACTATED RINGERS IV SOLN: INTRAVENOUS | Status: DC

## 2024-06-06 MED ORDER — CEFAZOLIN SODIUM-DEXTROSE 3-4 GM/150ML-% IV SOLN
3.0000 g | INTRAVENOUS | Status: DC
  Filled 2024-06-06: qty 150

## 2024-06-06 MED ORDER — ONDANSETRON HCL 4 MG/2ML IJ SOLN
INTRAMUSCULAR | Status: DC | PRN
  Administered 2024-06-06: 4 mg via INTRAVENOUS

## 2024-06-06 MED ORDER — OXYCODONE HCL 5 MG PO TABS: 5.0000 mg | ORAL_TABLET | Freq: Once | ORAL | Status: DC | PRN

## 2024-06-06 MED ORDER — SUGAMMADEX SODIUM 200 MG/2ML IV SOLN
INTRAVENOUS | Status: DC | PRN
  Administered 2024-06-06: 300 mg via INTRAVENOUS

## 2024-06-06 MED ORDER — FENTANYL CITRATE (PF) 100 MCG/2ML IJ SOLN
INTRAMUSCULAR | Status: AC
  Filled 2024-06-06: qty 2

## 2024-06-06 MED ORDER — ROCURONIUM BROMIDE 10 MG/ML (PF) SYRINGE
PREFILLED_SYRINGE | INTRAVENOUS | Status: AC
  Filled 2024-06-06: qty 10

## 2024-06-06 MED ORDER — SUGAMMADEX SODIUM 200 MG/2ML IV SOLN
INTRAVENOUS | Status: AC
Start: 2024-06-06 — End: 2024-06-06
  Filled 2024-06-06: qty 4

## 2024-06-06 MED ORDER — OXYBUTYNIN CHLORIDE 5 MG PO TABS: ORAL_TABLET | ORAL | 0 refills | Status: DC

## 2024-06-06 MED ORDER — STERILE WATER FOR IRRIGATION IR SOLN
Status: DC | PRN
  Administered 2024-06-06: 500 mL

## 2024-06-06 SURGICAL SUPPLY — 22 items
BAG COUNTER SPONGE SURGICOUNT (BAG) IMPLANT
BAG URINE DRAIN 2000ML AR STRL (UROLOGICAL SUPPLIES) IMPLANT
BAG URO CATCHER STRL LF (MISCELLANEOUS) ×1 IMPLANT
CATH FOLEY 2WAY SLVR 30CC 24FR (CATHETERS) IMPLANT
CATH FOLEY 2WAY SLVR 5CC 18FR (CATHETERS) IMPLANT
CATH URETL OPEN END 6FR 70 (CATHETERS) IMPLANT
CLOTH BEACON ORANGE TIMEOUT ST (SAFETY) ×1 IMPLANT
DRAPE FOOT SWITCH (DRAPES) ×1 IMPLANT
EVACUATOR MICROVAS BLADDER (UROLOGICAL SUPPLIES) IMPLANT
GLOVE SURG LX STRL 8.0 MICRO (GLOVE) ×1 IMPLANT
GOWN STRL REUS W/ TWL XL LVL3 (GOWN DISPOSABLE) ×1 IMPLANT
GUIDEWIRE ANG ZIPWIRE 038X150 (WIRE) IMPLANT
GUIDEWIRE STR DUAL SENSOR (WIRE) ×1 IMPLANT
KIT TURNOVER KIT A (KITS) ×1 IMPLANT
LOOP CUT BIPOLAR 24F LRG (ELECTROSURGICAL) ×1 IMPLANT
MANIFOLD NEPTUNE II (INSTRUMENTS) ×1 IMPLANT
NDL SAFETY ECLIPSE 18X1.5 (NEEDLE) ×1 IMPLANT
PACK CYSTO (CUSTOM PROCEDURE TRAY) ×1 IMPLANT
SYRINGE TOOMEY IRRIG 70ML (MISCELLANEOUS) IMPLANT
TUBING CONNECTING 10 (TUBING) ×1 IMPLANT
TUBING UROLOGY SET (TUBING) ×1 IMPLANT
WATER STERILE IRR 3000ML UROMA (IV SOLUTION) ×1 IMPLANT

## 2024-06-06 NOTE — Anesthesia Procedure Notes (Addendum)
 Procedure Name: Intubation Date/Time: 06/06/2024 7:52 AM  Performed by: Paul Lamarr BRAVO, MDPre-anesthesia Checklist: Patient identified, Emergency Drugs available, Suction available and Patient being monitored Patient Re-evaluated:Patient Re-evaluated prior to induction Oxygen Delivery Method: Circle system utilized Preoxygenation: Pre-oxygenation with 100% oxygen Induction Type: IV induction Ventilation: Mask ventilation without difficulty Laryngoscope Size: Mac and 3 Grade View: Grade I Tube type: Oral Tube size: 7.0 mm Number of attempts: 1 Airway Equipment and Method: Stylet and Oral airway Placement Confirmation: ETT inserted through vocal cords under direct vision, positive ETCO2 and breath sounds checked- equal and bilateral Secured at: 21 cm Tube secured with: Tape Dental Injury: Teeth and Oropharynx as per pre-operative assessment  Comments: Previous LMA removed because not achieving desired tidal volumes

## 2024-06-06 NOTE — Anesthesia Procedure Notes (Addendum)
 Procedure Name: LMA Insertion Date/Time: 06/06/2024 7:43 AM  Performed by: Paul Lamarr BRAVO, MDPre-anesthesia Checklist: Patient identified, Emergency Drugs available, Suction available and Patient being monitored Patient Re-evaluated:Patient Re-evaluated prior to induction Oxygen Delivery Method: Circle system utilized Preoxygenation: Pre-oxygenation with 100% oxygen Induction Type: IV induction Ventilation: Mask ventilation without difficulty LMA: LMA inserted LMA Size: 4.0 Number of attempts: 1 Placement Confirmation: positive ETCO2 and breath sounds checked- equal and bilateral Tube secured with: Tape Dental Injury: Teeth and Oropharynx as per pre-operative assessment  Comments: Inserted by [employer] student

## 2024-06-06 NOTE — Anesthesia Procedure Notes (Signed)
 Procedure Name: LMA Insertion Date/Time: 06/06/2024 7:50 AM  Performed by: Paul Lamarr BRAVO, MDPre-anesthesia Checklist: Patient identified, Emergency Drugs available, Suction available and Patient being monitored Patient Re-evaluated:Patient Re-evaluated prior to induction Oxygen Delivery Method: Circle system utilized Preoxygenation: Pre-oxygenation with 100% oxygen Induction Type: IV induction Ventilation: Mask ventilation without difficulty LMA: LMA with gastric port inserted LMA Size: 4.0 Number of attempts: 1 Placement Confirmation: positive ETCO2 and breath sounds checked- equal and bilateral Tube secured with: Tape Dental Injury: Teeth and Oropharynx as per pre-operative assessment  Comments: Previous LMA removed because not achieving desired tidal volumes, [employer] student placed LMA

## 2024-06-06 NOTE — Op Note (Signed)
:   Preoperative diagnosis: Bladder tumor, 1.5 cm in maximum diameter  Postoperative diagnosis: Same  Principal procedure: Cystoscopy, transurethral resection of 1.5 cm bladder tumor, placement of intravesical gemcitabine  Surgeon: Gabrianna Fassnacht  Anesthesia: General With LMA  Complications: None  Specimen: Bladder tumor fragments, to pathology  Estimated blood loss: None  Indications: 78 year old female presented earlier in the year with intermittent gross painless hematuria.  She had a CT scan which revealed normal upper tracts but several small lesions in the bladder neck area.  Office cystoscopy did not see these, but she had cytology returned as consistent with transitional cell carcinoma.  She presents at this time for definitive resection as well as placement of intravesical Gemzar.  I have discussed the procedure with the patient as well as expected outcomes, risks and complications.  She desires to proceed.  Findings: Urethra was normal.  She had a very large capacity bladder.  The bladder tumors were in the anterior bladder neck area on the right, quite difficult to see but these were identified using right angled lens.  The largest of these was perhaps 1.5 cm, 2-3 smaller satellite lesions were noted nearby.  Otherwise, urothelium of the bladder and the was normal.  Description of procedure: Patient properly identified in the holding area, taken to the operating room where general anesthetic was administered.  She was placed in the dorsolithotomy position.  Genitalia and perineum were prepped, draped, she also received 3 g of Ancef .  Proper timeout was performed.  26 French resectoscope sheath was placed using the visual obturator.  Using both a 30 and 70 degree lenses, bladder tumors were identified in the left bladder neck area.  The resectoscope and bipolar loop were then placed.  These areas were resected.  I did not resect deeply, as it appeared that she had a very thin-walled bladder.   However, with proper resection all bladder tumors were thoroughly resected.  The bases of these were cauterized.  There was no evident bleeding after this.  Bladder tumor fragments were irrigated from the bladder and sent to pathology labeled bladder tumor.  Reinspection of the bladder revealed resected sites to be hemostatic, no further tumor material remaining.  An 11 French Foley catheter was placed, balloon filled with 10 cc of water.  This was hooked to dependent drainage.  Patient was then awakened and taken to the PACU in stable condition.  In the PACU, 2 g of Gemzar was placed in the bladder, left indwelling for an hour, then drained.  Patient was sent home with her catheter.

## 2024-06-06 NOTE — H&P (Signed)
 H&P  Chief Complaint: Bladder abnormality  History of Present Illness: Erin Coffey is a 78 y.o. year old female here for TURBT of probable bladder cancer.  Past Medical History:  Diagnosis Date   Allergy    Anemia, iron  deficiency    ANXIETY, SITUATIONAL 04/06/2009   Qualifier: Diagnosis of  By: Flint  MD, Stephanie     Arthritis    Back pain, thoracic 12/20/2012   Cancer (HCC)    right breast   Carpal tunnel syndrome 12/07/2014   Cholelithiasis 06/25/2012   Chronic kidney disease    DM type 2 goal A1C below 7.5 06/05/2008   Qualifier: Diagnosis of  By: Flint  MD, Corean     Family history of breast cancer 03/05/2022   GASTROESOPHAGEAL REFLUX, NO ESOPHAGITIS 10/08/2006   Qualifier: Diagnosis of  By: Manford Rocky Saliva, unspecified 10/21/2007   Qualifier: Diagnosis of  By: ROJELIO MD, [name]     Heart murmur    History of DVT (deep vein thrombosis) 05/05/2013   on Eliquis    HYPERLIPIDEMIA 08/08/2008   Qualifier: Diagnosis of  By: Flint  MD, Stephanie     HYPERTENSION, BENIGN ESSENTIAL 11/27/2006   Qualifier: Diagnosis of  ByBETHA ROJELIO MD, [name]     Mild coronary artery disease 10/2019   Coronary CT Angiogram: Coronary calcium  score 46.Normal coronary origin.  Left dominance with tortuous coronary arteries.  Nondominant RCA - prox < 25% followed by ectasia (dilated to 5.5 mm) mixed plaque in distal vessel.  LM- normal . LAD: minimal mixed plaque in ostial & proximal (<25%) with brief IM bridging in mLAD. Mild mid-distal (25-49%), RI - prox <25%, Large LCx- minimal (<25%) mid-dista   Mild coronary artery disease    Personal history of colonic polyps 10/24/2009   Qualifier: Diagnosis of  By: Debrah MD, Lamar BIRCH    Pica 08/08/2009   Qualifier: Diagnosis of  By: Jordan, [name]     Pituitary adenoma [facility])    Polymyalgia rheumatica 10/02/2009   Qualifier: Diagnosis of  By: Rickard MD, Vernell     Pulmonary embolism [facility]) - On long Term anticoagulation (Z79.01) 04/28/2011    Converted from warfarin to Eliquis ; on long-term anticoagulation.   Thyroid  disease    Trigger ring finger of left hand 11/02/2014    Past Surgical History:  Procedure Laterality Date   ABDOMINAL HYSTERECTOMY     BREAST LUMPECTOMY WITH RADIOACTIVE SEED LOCALIZATION Right 03/25/2022   Procedure: RIGHT BREAST SEED LUMPECTOMY;  Surgeon: Vanderbilt Ned, MD;  Location: [facility];  Service: General;  Laterality: Right;   CARDIAC CATHETERIZATION  04/29/2011   (Dr. Morris - [facility]): LM - normal. LAD with 1 major Diag - normal, bifurcating Ramus - normal, Large (6-7 mm) Dominant LCx with 1 OM, 2 LPL branches & LPDA - non significant disease; small non-dominant RCA.     CARDIAC EVENT MONITOR  09/2019   Mostly sinus rhythm, average rate 60 bpm.  Range 45 to 190 bpm.  No critical events indicating any arrhythmias or pauses.  For patient responses noted PVCs.  Less than 1% PVCs noted.    CARPAL TUNNEL RELEASE Right 1996   COLONOSCOPY     KNEE ARTHROSCOPY  Bilateral   masctomy     TRANSTHORACIC ECHOCARDIOGRAM  09/2019   Normal EF - 60 to 65%.  Mild LVH.  GR 1 DD.  Normal RV.  Trivial MR.  Normal IVC.  Normal PA pressures.   TUBAL LIGATION  UPPER GASTROINTESTINAL ENDOSCOPY      Home Medications:  Facility-Administered Medications Prior to Admission  Medication Dose Route Frequency Provider Last Rate Last Admin   ketoconazole  (NIZORAL ) 2 % cream   Topical Daily        Medications Prior to Admission  Medication Sig Dispense Refill   acetaminophen  (TYLENOL ) 650 MG CR tablet Take 1,300 mg by mouth every 8 (eight) hours as needed for pain.     alendronate  (FOSAMAX ) 70 MG tablet TAKE 1 TABLET BY MOUTH ONCE A WEEK WITH 8 OZ OF PLAIN WATER 30 MINUTES BEFORE FIRST FOOD, DRINK, OR MEDS. STAY UPRIGHT FOR 30 MINUTES 12 tablet 0   amLODipine  (NORVASC ) 5 MG tablet Take 1 tablet (5 mg total) by mouth at bedtime. 100 tablet 2   anastrozole  (ARIMIDEX ) 1 MG tablet Take 1 tablet by mouth  once daily 90 tablet 0   apixaban  (ELIQUIS ) 5 MG TABS tablet Take 1 tablet (5 mg total) by mouth 2 (two) times daily. 180 tablet 3   atorvastatin  (LIPITOR) 40 MG tablet Take 1 tablet (40 mg total) by mouth daily. 100 tablet 2   baclofen  (LIORESAL ) 10 MG tablet Take 1 tablet (10 mg total) by mouth 3 (three) times daily. (Patient taking differently: Take 10 mg by mouth daily as needed for muscle spasms.) 30 each 0   BLACK CURRANT SEED OIL PO Take by mouth.     Cholecalciferol 50 MCG (2000 UT) TABS Take 2,200 Units by mouth daily.     diclofenac  Sodium (VOLTAREN ) 1 % GEL Apply 2 g topically 4 (four) times daily as needed (pain). 150 g 1   Ferrous Sulfate (IRON  PO) Take 25 mg by mouth. Gentle Iron  from [employer] store     fish oil-omega-3 fatty acids 1000 MG capsule Take 1 g by mouth daily.     losartan  (COZAAR ) 100 MG tablet Take 1 tablet (100 mg total) by mouth daily. 90 tablet 1   metoprolol  tartrate (LOPRESSOR ) 25 MG tablet Take 1 tablet (25 mg total) by mouth 2 (two) times daily. 90 tablet 2   omeprazole  (PRILOSEC) 20 MG capsule Take 1 capsule (20 mg total) by mouth daily. 30 capsule 0   polyethylene glycol powder (MIRALAX ) 17 GM/SCOOP powder Take 17 g by mouth daily as needed (constipation). 255 g 0   traMADol  (ULTRAM ) 50 MG tablet Take 1 tablet (50 mg total) by mouth every 6 (six) hours as needed. 30 tablet 0   vitamin C (ASCORBIC ACID) 500 MG tablet Take 500 mg by mouth daily.     ketoconazole  (NIZORAL ) 2 % cream Apply to both feet once daily for 4 weeks. 30 g 1   ondansetron  (ZOFRAN -ODT) 4 MG disintegrating tablet Take 1 tablet (4 mg total) by mouth every 8 (eight) hours as needed for nausea or vomiting. 15 tablet 0    Allergies:  Allergies  Allergen Reactions   Ciprofloxacin  Itching   Lisinopril Cough    Family History  Problem Relation Age of Onset   Diabetes Mother    Heart disease Mother    Hypertension Mother    Heart disease Father    Hypertension Father    Diabetes  Sister    Heart disease Sister    Leukemia Sister 6   Cirrhosis Sister        alcohol   Kidney disease Sister    Diabetes Sister    Hypertension Brother    Diabetes Brother    Breast cancer Cousin  dx 30s; maternal female cousin   Colon cancer Neg Hx    Pancreatic cancer Neg Hx    Esophageal cancer Neg Hx     Social History:  reports that she has never smoked. She has never been exposed to tobacco smoke. She has never used smokeless tobacco. She reports that she does not drink alcohol and does not use drugs.  ROS: A complete review of systems was performed.  All systems are negative except for pertinent findings as noted.  Physical Exam:  Vital signs in last 24 hours: Temp:  [98.5 F (36.9 C)] 98.5 F (36.9 C) (10/27 0528) Pulse Rate:  [63] 63 (10/27 0528) Resp:  [17] 17 (10/27 0528) BP: (160)/(74) 160/74 (10/27 0528) SpO2:  [100 %] 100 % (10/27 0528) Weight:  [121.6 kg] 121.6 kg (10/27 0558) General:  Alert and oriented, No acute distress HEENT: Normocephalic, atraumatic Neck: No JVD or lymphadenopathy Cardiovascular: Regular rate  Lungs: Normal inspiratory/expiratory excursion Extremities: No edema Neurologic: Grossly intact  I have reviewed prior pt notes   Impression/Assessment:  Small bladder masses on CT scan w/ positive urine cytology  Plan:  TURBT, probable gemzar instillation  Garnette HERO Makayela Secrest 06/06/2024, 7:25 AM  Garnette HERO. Jimi Schappert MD

## 2024-06-06 NOTE — Addendum Note (Signed)
 Addendum  created 06/06/24 1102 by Joshua Vernell BROCKS, CRNA   Child order released for a procedure order, Clinical Note Signed, Delete clinical note, Intraprocedure Blocks edited, LDA created via procedure documentation, LDA removed via procedure documentation, LDA updated via procedure documentation, Order Canceled from Note, SmartForm saved

## 2024-06-06 NOTE — Discharge Instructions (Signed)
You may see some blood in the urine and may have some burning with urination for 48-72 hours. You also may notice that you have to urinate more frequently or urgently after your procedure which is normal.  You should call should you develop an inability urinate, fever > 101, persistent nausea and vomiting that prevents you from eating or drinking to stay hydrated. If you have a catheter, you will be taught how to take care of the catheter by the nursing staff prior to discharge from the hospital.  You may periodically feel a strong urge to void with the catheter in place.  This is a bladder spasm and most often can occur when having a bowel movement or moving around. It is typically self-limited and usually will stop after a few minutes.  You may use some Vaseline or Neosporin around the tip of the catheter to reduce friction at the tip of the penis. You may also see some blood in the urine.  A very small amount of blood can make the urine look quite red.  As long as the catheter is draining well, there usually is not a problem.  However, if the catheter is not draining well and is bloody, you should call the office [phone]) to notify us.  It is okay to remove the catheter on Tuesday morning as instructed by the nurses.

## 2024-06-06 NOTE — Anesthesia Procedure Notes (Deleted)
 Procedure Name: LMA Insertion Date/Time: 06/06/2024 7:50 AM  Performed by: Joshua Vernell BROCKS, CRNAPre-anesthesia Checklist: Patient identified, Emergency Drugs available, Suction available and Patient being monitored Patient Re-evaluated:Patient Re-evaluated prior to induction Oxygen Delivery Method: Circle system utilized Preoxygenation: Pre-oxygenation with 100% oxygen Induction Type: IV induction Ventilation: Mask ventilation without difficulty LMA: LMA with gastric port inserted LMA Size: 4.0 Number of attempts: 1 Placement Confirmation: positive ETCO2 and breath sounds checked- equal and bilateral Tube secured with: Tape Dental Injury: Teeth and Oropharynx as per pre-operative assessment  Comments: Previous LMA removed because not  achieving desired tidal volumes, [employer] student placed LMA

## 2024-06-06 NOTE — Transfer of Care (Signed)
 Immediate Anesthesia Transfer of Care Note  Patient: Erin Coffey  Procedure(s) Performed: CYSTOSCOPY TURBT, WITH CHEMOTHERAPEUTIC AGENT INSTILLATION INTO BLADDER INSTILLATION, BLADDER  Patient Location: PACU  Anesthesia Type:General  Level of Consciousness: drowsy and patient cooperative  Airway & Oxygen Therapy: Patient Spontanous Breathing and Patient connected to face mask oxygen  Post-op Assessment: Report given to RN and Post -op Vital signs reviewed and stable  Post vital signs: Reviewed and stable  Last Vitals:  Vitals Value Taken Time  BP 131/55 06/06/24 08:25  Temp    Pulse 62 06/06/24 08:26  Resp 12 06/06/24 08:26  SpO2 100 % 06/06/24 08:26  Vitals shown include unfiled device data.  Last Pain:  Vitals:   06/06/24 0558  TempSrc:   PainSc: 0-No pain         Complications: No notable events documented.

## 2024-06-06 NOTE — Anesthesia Postprocedure Evaluation (Signed)
 Anesthesia Post Note  Patient: Erin Coffey  Procedure(s) Performed: CYSTOSCOPY TURBT, WITH CHEMOTHERAPEUTIC AGENT INSTILLATION INTO BLADDER INSTILLATION, BLADDER     Patient location during evaluation: PACU Anesthesia Type: General Level of consciousness: awake and alert Pain management: pain level controlled Vital Signs Assessment: post-procedure vital signs reviewed and stable Respiratory status: spontaneous breathing, nonlabored ventilation and respiratory function stable Cardiovascular status: blood pressure returned to baseline Postop Assessment: no apparent nausea or vomiting Anesthetic complications: no   No notable events documented.  Last Vitals:  Vitals:   06/06/24 0845 06/06/24 0900  BP: (!) 146/53 (!) 145/55  Pulse: (!) 54 (!) 47  Resp: (!) 8 10  Temp:    SpO2: 95% 97%    Last Pain:  Vitals:   06/06/24 0900  TempSrc:   PainSc: 5                  Vertell Row

## 2024-06-07 ENCOUNTER — Encounter (HOSPITAL_COMMUNITY): Payer: Self-pay | Admitting: Urology

## 2024-06-07 LAB — SURGICAL PATHOLOGY

## 2024-06-07 NOTE — Progress Notes (Signed)
 Patient here for preop visit.  She is scheduled for TURBT next week.  She has had no lower urinary tract symptomatology.  No change in her medical status.  Medications were reviewed  Pathology and urinalysis were reviewed  Impression: Probable TCCA of bladder, small-volume  Plan: Discussed TURBT with gemcitabine placement with her.  We will proceed as scheduled

## 2024-06-08 ENCOUNTER — Ambulatory Visit: Payer: Self-pay | Admitting: Urology

## 2024-06-19 NOTE — Progress Notes (Signed)
 Assessment: HG NMIBC--s/p TURBT 10.27.2025  Plan: -  History of Present Illness:  8.4.2025: Initial visit for evaluation of bladder abnormality seen on recent CT scan.  She presented to the emergency room about 10 days after sustaining a fall at church.  She is on Eliquis .  CT findings: 1. No acute traumatic injury in the chest, abdomen, or pelvis. 2. Mild pericholecystic haziness, nonspecific in the setting of a nondistended gallbladder. Recommend correlation with right upper quadrant pain. 3. New enhancing foci within the anterior right bladder wall measuring up to 9 x 8 mm, suspicious for bladder neoplasm. Recommend urology consultation. 4. Surgical clips in the right breast adjacent to a 4.8 x 3.6 cm fluid collection, which may be postsurgical. Recommend correlation with dedicated breast imaging. 5. Aortic Atherosclerosis (ICD10-I70.0). Coronary artery calcifications. Assessment for potential risk factor modification, dietary therapy or pharmacologic therapy may be warranted, if clinically indicated.  Since that visit, she has had 1 episode of gross painless hematuria.  She does not have history with high risk factors for urothelial carcinoma.  9.8.2025: Office cysto negative but cytology positive.  10.27.2025: Cysto/TURBT. Somewhat difficult anesthetic induction. BTs seen Rt anterior bladder wall. Path--HG urothelial carcinoma w/ LP invasion.Muscularis not seen.  11.10.2025:  Past Medical History:  Past Medical History:  Diagnosis Date   Allergy    Anemia, iron  deficiency    ANXIETY, SITUATIONAL 04/06/2009   Qualifier: Diagnosis of  By: Flint  MD, Stephanie     Arthritis    Back pain, thoracic 12/20/2012   Cancer (HCC)    right breast   Carpal tunnel syndrome 12/07/2014   Cholelithiasis 06/25/2012   Chronic kidney disease    DM type 2 goal A1C below 7.5 06/05/2008   Qualifier: Diagnosis of  By: Flint  MD, Corean     Family history of breast cancer 03/05/2022    GASTROESOPHAGEAL REFLUX, NO ESOPHAGITIS 10/08/2006   Qualifier: Diagnosis of  By: Manford Rocky Saliva, unspecified 10/21/2007   Qualifier: Diagnosis of  By: ROJELIO MD, [name]     Heart murmur    History of DVT (deep vein thrombosis) 05/05/2013   on Eliquis    HYPERLIPIDEMIA 08/08/2008   Qualifier: Diagnosis of  By: Flint  MD, Stephanie     HYPERTENSION, BENIGN ESSENTIAL 11/27/2006   Qualifier: Diagnosis of  ByBETHA ROJELIO MD, [name]     Mild coronary artery disease 10/2019   Coronary CT Angiogram: Coronary calcium  score 46.Normal coronary origin.  Left dominance with tortuous coronary arteries.  Nondominant RCA - prox < 25% followed by ectasia (dilated to 5.5 mm) mixed plaque in distal vessel.  LM- normal . LAD: minimal mixed plaque in ostial & proximal (<25%) with brief IM bridging in mLAD. Mild mid-distal (25-49%), RI - prox <25%, Large LCx- minimal (<25%) mid-dista   Mild coronary artery disease    Personal history of colonic polyps 10/24/2009   Qualifier: Diagnosis of  By: Debrah MD, Lamar BIRCH    Pica 08/08/2009   Qualifier: Diagnosis of  By: Jordan, [name]     Pituitary adenoma [facility])    Polymyalgia rheumatica 10/02/2009   Qualifier: Diagnosis of  By: Rickard MD, Vernell     Pulmonary embolism [facility] - On long Term anticoagulation (Z79.01) 04/28/2011   Converted from warfarin to Eliquis ; on long-term anticoagulation.   Thyroid  disease    Trigger ring finger of left hand 11/02/2014    Past Surgical History:  Past Surgical History:  Procedure Laterality Date   ABDOMINAL HYSTERECTOMY  BLADDER INSTILLATION N/A 06/06/2024   Procedure: INSTILLATION, BLADDER;  Surgeon: Matilda Senior, MD;  Location: [facility];  Service: Urology;  Laterality: N/A;  gemcitabine installation   BREAST LUMPECTOMY WITH RADIOACTIVE SEED LOCALIZATION Right 03/25/2022   Procedure: RIGHT BREAST SEED LUMPECTOMY;  Surgeon: Vanderbilt Ned, MD;  Location: [facility];  Service: General;  Laterality:  Right;   CARDIAC CATHETERIZATION  04/29/2011   (Dr. Morris - [place]): LM - normal. LAD with 1 major Diag - normal, bifurcating Ramus - normal, Large (6-7 mm) Dominant LCx with 1 OM, 2 LPL branches & LPDA - non significant disease; small non-dominant RCA.     CARDIAC EVENT MONITOR  09/2019   Mostly sinus rhythm, average rate 60 bpm.  Range 45 to 190 bpm.  No critical events indicating any arrhythmias or pauses.  For patient responses noted PVCs.  Less than 1% PVCs noted.    CARPAL TUNNEL RELEASE Right 1996   COLONOSCOPY     CYSTOSCOPY N/A 06/06/2024   Procedure: CYSTOSCOPY;  Surgeon: Matilda Senior, MD;  Location: [facility];  Service: Urology;  Laterality: N/A;   KNEE ARTHROSCOPY  Bilateral   masctomy     TRANSTHORACIC ECHOCARDIOGRAM  09/2019   Normal EF - 60 to 65%.  Mild LVH.  GR 1 DD.  Normal RV.  Trivial MR.  Normal IVC.  Normal PA pressures.   TUBAL LIGATION     UPPER GASTROINTESTINAL ENDOSCOPY      Allergies:  Allergies  Allergen Reactions   Ciprofloxacin  Itching   Lisinopril Cough    Family History:  Family History  Problem Relation Age of Onset   Diabetes Mother    Heart disease Mother    Hypertension Mother    Heart disease Father    Hypertension Father    Diabetes Sister    Heart disease Sister    Leukemia Sister 16   Cirrhosis Sister        alcohol   Kidney disease Sister    Diabetes Sister    Hypertension Brother    Diabetes Brother    Breast cancer Cousin        dx 20s; maternal female cousin   Colon cancer Neg Hx    Pancreatic cancer Neg Hx    Esophageal cancer Neg Hx     Social History:  Social History   Tobacco Use   Smoking status: Never    Passive exposure: Never   Smokeless tobacco: Never  Vaping Use   Vaping status: Never Used  Substance Use Topics   Alcohol use: No    Alcohol/week: 0.0 standard drinks of alcohol   Drug use: No     Indication: Bladder mass  After informed consent and discussion of the procedure and its risks,  pt was positioned and prepped in the standard fashion.  Cystoscopy was performed with a flexible cystoscope.   Findings:  Urethra: Normal, no lesions Ureteral orifices: Normally located, configured bilaterally. Bladder: Modest residual urine.  I saw no urothelial lesions.  Thorough, systematic inspection performed.     I have reviewed referring/prior physicians records  Path reviewed w/ pt  I have reviewed recent blood studies-CMP--estimated GFR 44, creatinine 1.26.  CBC--hemoglobin 10.6/hematocrit 33.0  I reviewed prior imaging studies--CT from July 28

## 2024-06-20 ENCOUNTER — Ambulatory Visit: Admitting: Urology

## 2024-06-20 VITALS — BP 183/76 | HR 64 | Ht 68.0 in | Wt 267.0 lb

## 2024-06-20 DIAGNOSIS — C673 Malignant neoplasm of anterior wall of bladder: Secondary | ICD-10-CM

## 2024-06-20 DIAGNOSIS — C679 Malignant neoplasm of bladder, unspecified: Secondary | ICD-10-CM | POA: Diagnosis not present

## 2024-06-20 LAB — URINALYSIS, ROUTINE W REFLEX MICROSCOPIC
Bilirubin, UA: NEGATIVE
Glucose, UA: NEGATIVE
Ketones, UA: NEGATIVE
Nitrite, UA: NEGATIVE
Specific Gravity, UA: 1.01 (ref 1.005–1.030)
Urobilinogen, Ur: 0.2 mg/dL (ref 0.2–1.0)
pH, UA: 6.5 (ref 5.0–7.5)

## 2024-06-20 LAB — MICROSCOPIC EXAMINATION

## 2024-06-27 ENCOUNTER — Ambulatory Visit: Admitting: Hematology and Oncology

## 2024-06-27 ENCOUNTER — Telehealth: Payer: Self-pay

## 2024-06-27 NOTE — Telephone Encounter (Signed)
 Left message for upcoming appointment on 11/18

## 2024-06-28 ENCOUNTER — Inpatient Hospital Stay: Attending: Hematology and Oncology | Admitting: Hematology and Oncology

## 2024-06-28 VITALS — BP 154/56 | HR 67 | Temp 97.3°F | Resp 17 | Wt 270.3 lb

## 2024-06-28 DIAGNOSIS — Z803 Family history of malignant neoplasm of breast: Secondary | ICD-10-CM | POA: Diagnosis not present

## 2024-06-28 DIAGNOSIS — Z86718 Personal history of other venous thrombosis and embolism: Secondary | ICD-10-CM | POA: Diagnosis not present

## 2024-06-28 DIAGNOSIS — M069 Rheumatoid arthritis, unspecified: Secondary | ICD-10-CM | POA: Insufficient documentation

## 2024-06-28 DIAGNOSIS — Z555 Less than a high school diploma: Secondary | ICD-10-CM | POA: Diagnosis not present

## 2024-06-28 DIAGNOSIS — Z860101 Personal history of adenomatous and serrated colon polyps: Secondary | ICD-10-CM | POA: Diagnosis not present

## 2024-06-28 DIAGNOSIS — Z7901 Long term (current) use of anticoagulants: Secondary | ICD-10-CM | POA: Insufficient documentation

## 2024-06-28 DIAGNOSIS — Z7983 Long term (current) use of bisphosphonates: Secondary | ICD-10-CM | POA: Insufficient documentation

## 2024-06-28 DIAGNOSIS — Z806 Family history of leukemia: Secondary | ICD-10-CM | POA: Diagnosis not present

## 2024-06-28 DIAGNOSIS — Z90710 Acquired absence of both cervix and uterus: Secondary | ICD-10-CM | POA: Insufficient documentation

## 2024-06-28 DIAGNOSIS — E119 Type 2 diabetes mellitus without complications: Secondary | ICD-10-CM | POA: Diagnosis not present

## 2024-06-28 DIAGNOSIS — Z79899 Other long term (current) drug therapy: Secondary | ICD-10-CM | POA: Insufficient documentation

## 2024-06-28 DIAGNOSIS — I10 Essential (primary) hypertension: Secondary | ICD-10-CM | POA: Diagnosis not present

## 2024-06-28 DIAGNOSIS — Z881 Allergy status to other antibiotic agents status: Secondary | ICD-10-CM | POA: Diagnosis not present

## 2024-06-28 DIAGNOSIS — Z833 Family history of diabetes mellitus: Secondary | ICD-10-CM | POA: Insufficient documentation

## 2024-06-28 DIAGNOSIS — Z86018 Personal history of other benign neoplasm: Secondary | ICD-10-CM | POA: Diagnosis not present

## 2024-06-28 DIAGNOSIS — D0511 Intraductal carcinoma in situ of right breast: Secondary | ICD-10-CM | POA: Diagnosis not present

## 2024-06-28 DIAGNOSIS — Z86711 Personal history of pulmonary embolism: Secondary | ICD-10-CM | POA: Insufficient documentation

## 2024-06-28 DIAGNOSIS — Z8249 Family history of ischemic heart disease and other diseases of the circulatory system: Secondary | ICD-10-CM | POA: Insufficient documentation

## 2024-06-28 DIAGNOSIS — Z8419 Family history of other disorders of kidney and ureter: Secondary | ICD-10-CM | POA: Insufficient documentation

## 2024-06-28 DIAGNOSIS — Z8379 Family history of other diseases of the digestive system: Secondary | ICD-10-CM | POA: Insufficient documentation

## 2024-06-28 DIAGNOSIS — Z79811 Long term (current) use of aromatase inhibitors: Secondary | ICD-10-CM | POA: Insufficient documentation

## 2024-06-28 NOTE — Progress Notes (Signed)
 [facility] up:    Erin Lukes, DO [address] [place] [address]   DIAGNOSIS:  Cancer Staging  Ductal carcinoma in situ (DCIS) of right breast Staging form: Breast, AJCC 8th Edition - Clinical stage from 03/05/2022: Stage 0 (cTis (DCIS), cN0, cM0, ER+, PR+, HER2: Not Assessed) - Unsigned Stage prefix: Initial diagnosis Nuclear grade: G1 Laterality: Right Staged by: Pathologist and managing physician Stage used in treatment planning: Yes National guidelines used in treatment planning: Yes    SUMMARY OF ONCOLOGIC HISTORY: Oncology History  Ductal carcinoma in situ (DCIS) of right breast  01/20/2022 Mammogram   Digital screening mammogram with incomplete evaluation.  Right breast focal asymmetry indeterminate. Diagnostic mammogram and ultrasound showed no correlate.  Stereotactic biopsy was recommended   02/14/2022 Pathology Results   Pathology from the right breast needle core biopsy showed DCIS, low to intermediate nuclear grade, cribriform and micropapillary types without necrosis and foci suspicious for microinvasion.  Negative for microcalcifications.  DCIS measured 2.2 mm in greatest extent   03/03/2022 Initial Diagnosis   Ductal carcinoma in situ (DCIS) of right breast   03/25/2022 Surgery   She had a right breast lumpectomy which showed DCIS abutting the posterior margin of resection, additional breast tissue from posterior margin negative for DCIS.  No invasive carcinoma.  Prognostics from prior biopsy showed 100% ER positive and 100% PR positive.   04/30/2022 - 05/27/2022 Radiation Therapy   Site Technique Total Dose (Gy) Dose per Fx (Gy) Completed Fx Beam Energies  Breast, Right: Breast_R 3D 42.56/42.56 2.66 16/16 15X, 10XFFF  Breast, Right: Breast_R_Bst 3D 8/8 2 4/4 10X     06/2022 -  Anti-estrogen oral therapy   Anastrozole      CURRENT THERAPY:  INTERVAL HISTORY:  Discussed the use of AI scribe software for clinical note  transcription with the patient, who gave verbal consent to proceed.  History of Present Illness Erin [facility]offe[facility] is a 78 year old female with breast cancer who presents for follow-up regarding her breast health and medication management.  She underwent surgery on her right breast for breast cancer. Recent imaging, including a mammogram and ultrasound in , shows fluid in the right breast, which varies with her position. She is awaiting a bilateral mammogram appointment next month at [facility]. There are no new symptoms or changes in her breast aside from the fluid sensation.  She is on anastrozole , which she tolerates well. She also takes Fosamax  (alendronate ) weekly for osteopenia, with a T-score of -1.6, and supplements with calcium  and vitamin D . Her last calcium  level was normal in January.     Patient Active Problem List   Diagnosis Date Noted   Preop cardiovascular exam 05/23/2024   PVC's (premature ventricular contractions) 05/23/2024   Numbness and tingling 04/27/2023   Rheumatoid arthritis involving right hand (HCC) 03/23/2023   Right hand pain 03/23/2023   Hand tingling 03/23/2023   Family history of breast cancer 03/05/2022   Ductal carcinoma in situ (DCIS) of right breast 03/03/2022   Fatigue 09/04/2021   Closed compression fracture of first lumbar vertebra (HCC) 04/09/2021   BPPV (benign paroxysmal positional vertigo), left 04/04/2021   Peripheral vertigo 01/15/2021   Pituitary mass 12/31/2020   Left anterior knee pain 12/13/2020   Chronic kidney disease, stage 3a (HCC) 12/13/2020   Abdominal pain 04/30/2020   Body mass index (BMI) 40.0-44.9, adult (HCC) 01/26/2020   Mild coronary artery disease 10/2019   History of spinal fracture 03/24/2019  Age-related osteoporosis with current pathological fracture 03/24/2019   Nontraumatic complete tear of left rotator cuff 03/24/2019   Primary hypercoagulable state 03/07/2019   Osteoporotic compression fracture of spine (HCC)  03/07/2019   Pulmonary emboli (HCC) 03/03/2019   Chronic left hip pain 08/17/2018   Left sided numbness 07/22/2018   Low back pain potentially associated with radiculopathy 01/29/2018   Diverticulosis of colon 03/24/2017   Hypertensive retinopathy of right eye 01/20/2017   Hypermetropia of both eyes 01/20/2017   Chronic kidney disease 04/29/2016   Anemia 04/29/2016   Rotator cuff dysfunction 06/25/2015   Long term (current) use of anticoagulants 05/08/2011   Hyperlipidemia associated with type 2 diabetes mellitus (HCC) 08/08/2008   Type 2 diabetes mellitus with hemoglobin A1c goal of less than 7.5% (HCC) 06/05/2008   HYPERTENSION, BENIGN ESSENTIAL 11/27/2006   Morbid (severe) obesity due to excess calories (HCC) 10/08/2006   GASTROESOPHAGEAL REFLUX, NO ESOPHAGITIS 10/08/2006   Osteoarthritis, multiple sites 10/08/2006    is allergic to ciprofloxacin  and lisinopril.  MEDICAL HISTORY: Past Medical History:  Diagnosis Date   Allergy    Anemia, iron  deficiency    ANXIETY, SITUATIONAL 04/06/2009   Qualifier: Diagnosis of  By: [place]  MD, Stephanie     Arthritis    Back pain, thoracic 12/20/2012   Cancer (HCC)    right breast   Carpal tunnel syndrome 12/07/2014   Cholelithiasis 06/25/2012   Chronic kidney disease    DM type 2 goal A1C below 7.5 06/05/2008   Qualifier: Diagnosis of  By: [place]  MD, Corean     Family history of breast cancer 03/05/2022   GASTROESOPHAGEAL REFLUX, NO ESOPHAGITIS 10/08/2006   Qualifier: Diagnosis of  By: Manford Rocky Saliva, unspecified 10/21/2007   Qualifier: Diagnosis of  By: ROJELIO MD, [name]     Heart murmur    History of DVT (deep vein thrombosis) 05/05/2013   on Eliquis    HYPERLIPIDEMIA 08/08/2008   Qualifier: Diagnosis of  By: [place]  MD, Stephanie     HYPERTENSION, BENIGN ESSENTIAL 11/27/2006   Qualifier: Diagnosis of  ByBETHA ROJELIO MD, [name]     Mild coronary artery disease 10/2019   Coronary CT Angiogram: Coronary calcium  score 46.Normal  coronary origin.  Left dominance with tortuous coronary arteries.  Nondominant RCA - prox < 25% followed by ectasia (dilated to 5.5 mm) mixed plaque in distal vessel.  LM- normal . LAD: minimal mixed plaque in ostial & proximal (<25%) with brief IM bridging in mLAD. Mild mid-distal (25-49%), RI - prox <25%, Large LCx- minimal (<25%) mid-dista   Mild coronary artery disease    Personal history of colonic polyps 10/24/2009   Qualifier: Diagnosis of  By: Debrah MD, Lamar BIRCH    Pica 08/08/2009   Qualifier: Diagnosis of  By: Jordan, [name]     Pituitary adenoma [facility])    Polymyalgia rheumatica 10/02/2009   Qualifier: Diagnosis of  By: Rickard MD, Vernell     Pulmonary embolism [facility]) - On long Term anticoagulation (Z79.01) 04/28/2011   Converted from warfarin to Eliquis ; on long-term anticoagulation.   Thyroid  disease    Trigger ring finger of left hand 11/02/2014    SURGICAL HISTORY: Past Surgical History:  Procedure Laterality Date   ABDOMINAL HYSTERECTOMY     BLADDER INSTILLATION N/A 06/06/2024   Procedure: INSTILLATION, BLADDER;  Surgeon: Matilda Senior, MD;  Location: [facility];  Service: Urology;  Laterality: N/A;  gemcitabine installation   BREAST LUMPECTOMY WITH RADIOACTIVE SEED LOCALIZATION Right 03/25/2022  Procedure: RIGHT BREAST SEED LUMPECTOMY;  Surgeon: Vanderbilt Ned, MD;  Location: [facility];  Service: General;  Laterality: Right;   CARDIAC CATHETERIZATION  04/29/2011   (Dr. Morris - ): LM - normal. LAD with 1 major Diag - normal, bifurcating Ramus - normal, Large (6-7 mm) Dominant LCx with 1 OM, 2 LPL branches & LPDA - non significant disease; small non-dominant RCA.     CARDIAC EVENT MONITOR  09/2019   Mostly sinus rhythm, average rate 60 bpm.  Range 45 to 190 bpm.  No critical events indicating any arrhythmias or pauses.  For patient responses noted PVCs.  Less than 1% PVCs noted.    CARPAL TUNNEL RELEASE Right 1996   COLONOSCOPY     CYSTOSCOPY  N/A 06/06/2024   Procedure: CYSTOSCOPY;  Surgeon: Matilda Senior, MD;  Location: [facility];  Service: Urology;  Laterality: N/A;   KNEE ARTHROSCOPY  Bilateral   masctomy     TRANSTHORACIC ECHOCARDIOGRAM  09/2019   Normal EF - 60 to 65%.  Mild LVH.  GR 1 DD.  Normal RV.  Trivial MR.  Normal IVC.  Normal PA pressures.   TUBAL LIGATION     UPPER GASTROINTESTINAL ENDOSCOPY      SOCIAL HISTORY: Social History   Socioeconomic History   Marital status: Widowed    Spouse name: Not on file   Number of children: 3   Years of education: 10   Highest education level: 10th grade  Occupational History   Occupation: Retired- Multimedia Programmer: [employer]  Tobacco Use   Smoking status: Never    Passive exposure: Never   Smokeless tobacco: Never  Vaping Use   Vaping status: Never Used  Substance and Sexual Activity   Alcohol use: No    Alcohol/week: 0.0 standard drinks of alcohol   Drug use: No   Sexual activity: Not Currently  Other Topics Concern   Not on file  Social History Narrative   Patient lives alone in [place], she is a widow.   Patient has 3 children. Two daughters live locally, one son in [place] .    Patient is very active in her church and volunteer work to stay busy.    Patient enjoys cooking, spending time with family, and crossword puzzles.       Social Drivers of Corporate Investment Banker Strain: Low Risk  (05/01/2023)   Overall Financial Resource Strain (CARDIA)    Difficulty of Paying Living Expenses: Not hard at all  Food Insecurity: No Food Insecurity (05/01/2023)   Hunger Vital Sign    Worried About Running Out of Food in the Last Year: Never true    Ran Out of Food in the Last Year: Never true  Transportation Needs: No Transportation Needs (05/01/2023)   PRAPARE - Administrator, Civil Service (Medical): No    Lack of Transportation (Non-Medical): No  Physical Activity: Insufficiently Active (05/01/2023)   Exercise Vital Sign    Days of  Exercise per Week: 3 days    Minutes of Exercise per Session: 30 min  Stress: No Stress Concern Present (05/01/2023)   [employer] of Occupational Health - Occupational Stress Questionnaire    Feeling of Stress : Not at all  Social Connections: Moderately Integrated (05/01/2023)   Social Connection and Isolation Panel    Frequency of Communication with Friends and Family: More than three times a week    Frequency of Social Gatherings with Friends and Family: Three times  a week    Attends Religious Services: More than 4 times per year    Active Member of Clubs or Organizations: Yes    Attends Banker Meetings: More than 4 times per year    Marital Status: Widowed  Intimate Partner Violence: Not At Risk (05/01/2023)   Humiliation, Afraid, Rape, and Kick questionnaire    Fear of Current or Ex-Partner: No    Emotionally Abused: No    Physically Abused: No    Sexually Abused: No    FAMILY HISTORY: Family History  Problem Relation Age of Onset   Diabetes Mother    Heart disease Mother    Hypertension Mother    Heart disease Father    Hypertension Father    Diabetes Sister    Heart disease Sister    Leukemia Sister 104   Cirrhosis Sister        alcohol   Kidney disease Sister    Diabetes Sister    Hypertension Brother    Diabetes Brother    Breast cancer Cousin        dx 25s; maternal female cousin   Colon cancer Neg Hx    Pancreatic cancer Neg Hx    Esophageal cancer Neg Hx     Review of Systems  Constitutional:  Negative for appetite change, chills, fatigue, fever and unexpected weight change.  HENT:   Negative for hearing loss, lump/mass and trouble swallowing.   Eyes:  Negative for eye problems and icterus.  Respiratory:  Negative for chest tightness, cough and shortness of breath.   Cardiovascular:  Negative for chest pain, leg swelling and palpitations.  Gastrointestinal:  Negative for abdominal distention, abdominal pain, constipation, diarrhea,  nausea and vomiting.  Endocrine: Negative for hot flashes.  Genitourinary:  Negative for difficulty urinating.   Musculoskeletal:  Negative for arthralgias.  Skin:  Negative for itching and rash.  Neurological:  Negative for dizziness, extremity weakness, headaches and numbness.  Hematological:  Negative for adenopathy. Does not bruise/bleed easily.  Psychiatric/Behavioral:  Negative for depression. The patient is not nervous/anxious.       PHYSICAL EXAMINATION    Vitals:   06/28/24 1354  BP: (!) 154/56  Pulse: 67  Resp: 17  Temp: (!) 97.3 F (36.3 C)  SpO2: 100%    Physical Exam Constitutional:      General: She is not in acute distress.    Appearance: Normal appearance. She is not toxic-appearing.  HENT:     Head: Normocephalic and atraumatic.     Mouth/Throat:     Mouth: Mucous membranes are moist.     Pharynx: Oropharynx is clear. No oropharyngeal exudate or posterior oropharyngeal erythema.  Eyes:     General: No scleral icterus. Cardiovascular:     Rate and Rhythm: Normal rate and regular rhythm.     Pulses: Normal pulses.     Heart sounds: Normal heart sounds.  Pulmonary:     Effort: Pulmonary effort is normal.     Breath sounds: Normal breath sounds.  Chest:     Comments: Right breast s/p lumpectomy and radiation, small seroma noted at lumpectomy site, no sign of local recurrence, left breast is benign Abdominal:     General: Abdomen is flat. Bowel sounds are normal. There is no distension.     Palpations: Abdomen is soft.     Tenderness: There is no abdominal tenderness.  Musculoskeletal:        General: No swelling.     Cervical back: Neck supple.  Lymphadenopathy:     Cervical: No cervical adenopathy.     Upper Body:     Right upper body: No supraclavicular or axillary adenopathy.     Left upper body: No supraclavicular or axillary adenopathy.  Skin:    General: Skin is warm and dry.     Findings: No rash.  Neurological:     General: No focal  deficit present.     Mental Status: She is alert.  Psychiatric:        Mood and Affect: Mood normal.        Behavior: Behavior normal.     LABORATORY DATA:  CBC    Component Value Date/Time   WBC 3.7 (L) 05/30/2024 0919   RBC 3.73 (L) 05/30/2024 0919   HGB 11.1 (L) 05/30/2024 0919   HGB 10.3 (L) 01/29/2024 1405   HCT 34.8 (L) 05/30/2024 0919   HCT 31.5 (L) 01/29/2024 1405   PLT 182 05/30/2024 0919   PLT 189 01/29/2024 1405   MCV 93.3 05/30/2024 0919   MCV 94 01/29/2024 1405   MCH 29.8 05/30/2024 0919   MCHC 31.9 05/30/2024 0919   RDW 15.0 05/30/2024 0919   RDW 14.1 01/29/2024 1405   LYMPHSABS 1.3 03/07/2024 1009   LYMPHSABS 1.6 01/29/2024 1405   MONOABS 0.4 03/07/2024 1009   EOSABS 0.2 03/07/2024 1009   EOSABS 0.2 01/29/2024 1405   BASOSABS 0.0 03/07/2024 1009   BASOSABS 0.0 01/29/2024 1405    CMP     Component Value Date/Time   NA 139 05/30/2024 0919   NA 136 01/29/2024 1405   K 4.2 05/30/2024 0919   CL 107 05/30/2024 0919   CO2 23 05/30/2024 0919   GLUCOSE 120 (H) 05/30/2024 0919   BUN 23 05/30/2024 0919   BUN 21 01/29/2024 1405   CREATININE 1.28 (H) 05/30/2024 0919   CREATININE 1.38 (H) 03/05/2022 0806   CREATININE 1.22 (H) 04/29/2016 0914   CALCIUM  10.1 05/30/2024 0919   PROT 7.2 01/29/2024 1405   ALBUMIN 3.7 (L) 01/29/2024 1405   AST 23 01/29/2024 1405   AST 22 03/05/2022 0806   ALT 12 01/29/2024 1405   ALT 14 03/05/2022 0806   ALKPHOS 66 01/29/2024 1405   BILITOT 0.4 01/29/2024 1405   BILITOT 0.6 03/05/2022 0806   GFRNONAA 43 (L) 05/30/2024 0919   GFRNONAA 40 (L) 03/05/2022 0806   GFRNONAA 46 (L) 04/29/2016 0914   GFRAA 46 (L) 04/30/2020 1505   GFRAA 53 (L) 04/29/2016 0914     ASSESSMENT and THERAPY PLAN:   No problem-specific Assessment & Plan notes found for this encounter.  All questions were answered. The patient knows to call the clinic with any problems, questions or concerns. We can certainly see the patient much sooner if  necessary.  Total encounter time:20 minutes*in face-to-face visit time, chart review, lab review, care coordination, order entry, and documentation of the encounter time.    Morna Kendall, NP 06/28/24 2:17 PM Medical Oncology and Hematology [facility] [address] [place], [place] [address] Tel. [phone]    Fax. [phone]  *Total Encounter Time as defined by the Centers for Medicare and Medicaid Services includes, in addition to the face-to-face time of a patient visit (documented in the note above) non-face-to-face time: obtaining and reviewing outside history, ordering and reviewing medications, tests or procedures, care coordination (communications with other health care professionals or caregivers) and documentation in the medical record.

## 2024-06-28 NOTE — Progress Notes (Unsigned)
  Cancer Center Cancer Follow up:    Erin Lukes, DO [address] [place] [address]   DIAGNOSIS:  Cancer Staging  Ductal carcinoma in situ (DCIS) of right breast Staging form: Breast, AJCC 8th Edition - Clinical stage from 03/05/2022: Stage 0 (cTis (DCIS), cN0, cM0, ER+, PR+, HER2: Not Assessed) - Unsigned Stage prefix: Initial diagnosis Nuclear grade: G1 Laterality: Right Staged by: Pathologist and managing physician Stage used in treatment planning: Yes National guidelines used in treatment planning: Yes    SUMMARY OF ONCOLOGIC HISTORY: Oncology History  Ductal carcinoma in situ (DCIS) of right breast  01/20/2022 Mammogram   Digital screening mammogram with incomplete evaluation.  Right breast focal asymmetry indeterminate. Diagnostic mammogram and ultrasound showed no correlate.  Stereotactic biopsy was recommended   02/14/2022 Pathology Results   Pathology from the right breast needle core biopsy showed DCIS, low to intermediate nuclear grade, cribriform and micropapillary types without necrosis and foci suspicious for microinvasion.  Negative for microcalcifications.  DCIS measured 2.2 mm in greatest extent   03/03/2022 Initial Diagnosis   Ductal carcinoma in situ (DCIS) of right breast   03/25/2022 Surgery   She had a right breast lumpectomy which showed DCIS abutting the posterior margin of resection, additional breast tissue from posterior margin negative for DCIS.  No invasive carcinoma.  Prognostics from prior biopsy showed 100% ER positive and 100% PR positive.   04/30/2022 - 05/27/2022 Radiation Therapy   Site Technique Total Dose (Gy) Dose per Fx (Gy) Completed Fx Beam Energies  Breast, Right: Breast_R 3D 42.56/42.56 2.66 16/16 15X, 10XFFF  Breast, Right: Breast_R_Bst 3D 8/8 2 4/4 10X     06/2022 -  Anti-estrogen oral therapy   Anastrozole      CURRENT THERAPY:  INTERVAL HISTORY:  Discussed the use of AI scribe software for clinical note  transcription with the patient, who gave verbal consent to proceed.  History of Present Illness Erin Coffey is a 78 year old female with breast cancer who presents for follow-up.     Patient Active Problem List   Diagnosis Date Noted   Preop cardiovascular exam 05/23/2024   PVC's (premature ventricular contractions) 05/23/2024   Numbness and tingling 04/27/2023   Rheumatoid arthritis involving right hand (HCC) 03/23/2023   Right hand pain 03/23/2023   Hand tingling 03/23/2023   Family history of breast cancer 03/05/2022   Ductal carcinoma in situ (DCIS) of right breast 03/03/2022   Fatigue 09/04/2021   Closed compression fracture of first lumbar vertebra (HCC) 04/09/2021   BPPV (benign paroxysmal positional vertigo), left 04/04/2021   Peripheral vertigo 01/15/2021   Pituitary mass 12/31/2020   Left anterior knee pain 12/13/2020   Chronic kidney disease, stage 3a (HCC) 12/13/2020   Abdominal pain 04/30/2020   Body mass index (BMI) 40.0-44.9, adult (HCC) 01/26/2020   Mild coronary artery disease 10/2019   History of spinal fracture 03/24/2019   Age-related osteoporosis with current pathological fracture 03/24/2019   Nontraumatic complete tear of left rotator cuff 03/24/2019   Primary hypercoagulable state 03/07/2019   Osteoporotic compression fracture of spine (HCC) 03/07/2019   Pulmonary emboli (HCC) 03/03/2019   Chronic left hip pain 08/17/2018   Left sided numbness 07/22/2018   Low back pain potentially associated with radiculopathy 01/29/2018   Diverticulosis of colon 03/24/2017   Hypertensive retinopathy of right eye 01/20/2017   Hypermetropia of both eyes 01/20/2017   Chronic kidney disease 04/29/2016   Anemia 04/29/2016   Rotator cuff dysfunction 06/25/2015   Long  term (current) use of anticoagulants 05/08/2011   Hyperlipidemia associated with type 2 diabetes mellitus (HCC) 08/08/2008   Type 2 diabetes mellitus with hemoglobin A1c goal of less than 7.5% (HCC)  06/05/2008   HYPERTENSION, BENIGN ESSENTIAL 11/27/2006   Morbid (severe) obesity due to excess calories (HCC) 10/08/2006   GASTROESOPHAGEAL REFLUX, NO ESOPHAGITIS 10/08/2006   Osteoarthritis, multiple sites 10/08/2006    is allergic to ciprofloxacin  and lisinopril.  MEDICAL HISTORY: Past Medical History:  Diagnosis Date   Allergy    Anemia, iron  deficiency    ANXIETY, SITUATIONAL 04/06/2009   Qualifier: Diagnosis of  By: Flint  MD, Stephanie     Arthritis    Back pain, thoracic 12/20/2012   Cancer (HCC)    right breast   Carpal tunnel syndrome 12/07/2014   Cholelithiasis 06/25/2012   Chronic kidney disease    DM type 2 goal A1C below 7.5 06/05/2008   Qualifier: Diagnosis of  By: Flint  MD, Corean     Family history of breast cancer 03/05/2022   GASTROESOPHAGEAL REFLUX, NO ESOPHAGITIS 10/08/2006   Qualifier: Diagnosis of  By: Manford Rocky Saliva, unspecified 10/21/2007   Qualifier: Diagnosis of  By: ROJELIO MD, [name]     Heart murmur    History of DVT (deep vein thrombosis) 05/05/2013   on Eliquis    HYPERLIPIDEMIA 08/08/2008   Qualifier: Diagnosis of  By: Flint  MD, Stephanie     HYPERTENSION, BENIGN ESSENTIAL 11/27/2006   Qualifier: Diagnosis of  ByBETHA ROJELIO MD, [name]     Mild coronary artery disease 10/2019   Coronary CT Angiogram: Coronary calcium  score 46.Normal coronary origin.  Left dominance with tortuous coronary arteries.  Nondominant RCA - prox < 25% followed by ectasia (dilated to 5.5 mm) mixed plaque in distal vessel.  LM- normal . LAD: minimal mixed plaque in ostial & proximal (<25%) with brief IM bridging in mLAD. Mild mid-distal (25-49%), RI - prox <25%, Large LCx- minimal (<25%) mid-dista   Mild coronary artery disease    Personal history of colonic polyps 10/24/2009   Qualifier: Diagnosis of  By: Debrah MD, Lamar BIRCH    Pica 08/08/2009   Qualifier: Diagnosis of  By: Jordan, [name]     Pituitary adenoma [facility])    Polymyalgia rheumatica 10/02/2009    Qualifier: Diagnosis of  By: Rickard MD, Vernell     Pulmonary embolism [facility]) - On long Term anticoagulation (Z79.01) 04/28/2011   Converted from warfarin to Eliquis ; on long-term anticoagulation.   Thyroid  disease    Trigger ring finger of left hand 11/02/2014    SURGICAL HISTORY: Past Surgical History:  Procedure Laterality Date   ABDOMINAL HYSTERECTOMY     BLADDER INSTILLATION N/A 06/06/2024   Procedure: INSTILLATION, BLADDER;  Surgeon: Matilda Senior, MD;  Location: [facility];  Service: Urology;  Laterality: N/A;  gemcitabine installation   BREAST LUMPECTOMY WITH RADIOACTIVE SEED LOCALIZATION Right 03/25/2022   Procedure: RIGHT BREAST SEED LUMPECTOMY;  Surgeon: Vanderbilt Ned, MD;  Location: [facility];  Service: General;  Laterality: Right;   CARDIAC CATHETERIZATION  04/29/2011   (Dr. Morris - [place]): LM - normal. LAD with 1 major Diag - normal, bifurcating Ramus - normal, Large (6-7 mm) Dominant LCx with 1 OM, 2 LPL branches & LPDA - non significant disease; small non-dominant RCA.     CARDIAC EVENT MONITOR  09/2019   Mostly sinus rhythm, average rate 60 bpm.  Range 45 to 190 bpm.  No critical events indicating any arrhythmias  or pauses.  For patient responses noted PVCs.  Less than 1% PVCs noted.    CARPAL TUNNEL RELEASE Right 1996   COLONOSCOPY     CYSTOSCOPY N/A 06/06/2024   Procedure: CYSTOSCOPY;  Surgeon: Matilda Senior, MD;  Location: [facility];  Service: Urology;  Laterality: N/A;   KNEE ARTHROSCOPY  Bilateral   masctomy     TRANSTHORACIC ECHOCARDIOGRAM  09/2019   Normal EF - 60 to 65%.  Mild LVH.  GR 1 DD.  Normal RV.  Trivial MR.  Normal IVC.  Normal PA pressures.   TUBAL LIGATION     UPPER GASTROINTESTINAL ENDOSCOPY      SOCIAL HISTORY: Social History   Socioeconomic History   Marital status: Widowed    Spouse name: Not on file   Number of children: 3   Years of education: 10   Highest education level: 10th grade  Occupational History    Occupation: Retired- Multimedia Programmer: [employer]  Tobacco Use   Smoking status: Never    Passive exposure: Never   Smokeless tobacco: Never  Vaping Use   Vaping status: Never Used  Substance and Sexual Activity   Alcohol use: No    Alcohol/week: 0.0 standard drinks of alcohol   Drug use: No   Sexual activity: Not Currently  Other Topics Concern   Not on file  Social History Narrative   Patient lives alone in [place], she is a widow.   Patient has 3 children. Two daughters live locally, one son in [place] .    Patient is very active in her church and volunteer work to stay busy.    Patient enjoys cooking, spending time with family, and crossword puzzles.       Social Drivers of Corporate Investment Banker Strain: Low Risk  (05/01/2023)   Overall Financial Resource Strain (CARDIA)    Difficulty of Paying Living Expenses: Not hard at all  Food Insecurity: No Food Insecurity (05/01/2023)   Hunger Vital Sign    Worried About Running Out of Food in the Last Year: Never true    Ran Out of Food in the Last Year: Never true  Transportation Needs: No Transportation Needs (05/01/2023)   PRAPARE - Administrator, Civil Service (Medical): No    Lack of Transportation (Non-Medical): No  Physical Activity: Insufficiently Active (05/01/2023)   Exercise Vital Sign    Days of Exercise per Week: 3 days    Minutes of Exercise per Session: 30 min  Stress: No Stress Concern Present (05/01/2023)   [employer] of Occupational Health - Occupational Stress Questionnaire    Feeling of Stress : Not at all  Social Connections: Moderately Integrated (05/01/2023)   Social Connection and Isolation Panel    Frequency of Communication with Friends and Family: More than three times a week    Frequency of Social Gatherings with Friends and Family: Three times a week    Attends Religious Services: More than 4 times per year    Active Member of Clubs or Organizations: Yes    Attends Tax Inspector Meetings: More than 4 times per year    Marital Status: Widowed  Intimate Partner Violence: Not At Risk (05/01/2023)   Humiliation, Afraid, Rape, and Kick questionnaire    Fear of Current or Ex-Partner: No    Emotionally Abused: No    Physically Abused: No    Sexually Abused: No    FAMILY HISTORY: Family History  Problem Relation Age of Onset  Diabetes Mother    Heart disease Mother    Hypertension Mother    Heart disease Father    Hypertension Father    Diabetes Sister    Heart disease Sister    Leukemia Sister 20   Cirrhosis Sister        alcohol   Kidney disease Sister    Diabetes Sister    Hypertension Brother    Diabetes Brother    Breast cancer Cousin        dx 20s; maternal female cousin   Colon cancer Neg Hx    Pancreatic cancer Neg Hx    Esophageal cancer Neg Hx     Review of Systems  Constitutional:  Negative for appetite change, chills, fatigue, fever and unexpected weight change.  HENT:   Negative for hearing loss, lump/mass and trouble swallowing.   Eyes:  Negative for eye problems and icterus.  Respiratory:  Negative for chest tightness, cough and shortness of breath.   Cardiovascular:  Negative for chest pain, leg swelling and palpitations.  Gastrointestinal:  Negative for abdominal distention, abdominal pain, constipation, diarrhea, nausea and vomiting.  Endocrine: Negative for hot flashes.  Genitourinary:  Negative for difficulty urinating.   Musculoskeletal:  Negative for arthralgias.  Skin:  Negative for itching and rash.  Neurological:  Negative for dizziness, extremity weakness, headaches and numbness.  Hematological:  Negative for adenopathy. Does not bruise/bleed easily.  Psychiatric/Behavioral:  Negative for depression. The patient is not nervous/anxious.       PHYSICAL EXAMINATION    Vitals:   06/28/24 1354  BP: (!) 154/56  Pulse: 67  Resp: 17  Temp: (!) 97.3 F (36.3 C)  SpO2: 100%    Physical  Exam Constitutional:      General: She is not in acute distress.    Appearance: Normal appearance. She is not toxic-appearing.  HENT:     Head: Normocephalic and atraumatic.     Mouth/Throat:     Mouth: Mucous membranes are moist.     Pharynx: Oropharynx is clear. No oropharyngeal exudate or posterior oropharyngeal erythema.  Eyes:     General: No scleral icterus. Cardiovascular:     Rate and Rhythm: Normal rate and regular rhythm.     Pulses: Normal pulses.     Heart sounds: Normal heart sounds.  Pulmonary:     Effort: Pulmonary effort is normal.     Breath sounds: Normal breath sounds.  Chest:     Comments: Right breast s/p lumpectomy and radiation, small seroma noted at lumpectomy site, no sign of local recurrence, left breast is benign Abdominal:     General: Abdomen is flat. Bowel sounds are normal. There is no distension.     Palpations: Abdomen is soft.     Tenderness: There is no abdominal tenderness.  Musculoskeletal:        General: No swelling.     Cervical back: Neck supple.  Lymphadenopathy:     Cervical: No cervical adenopathy.     Upper Body:     Right upper body: No supraclavicular or axillary adenopathy.     Left upper body: No supraclavicular or axillary adenopathy.  Skin:    General: Skin is warm and dry.     Findings: No rash.  Neurological:     General: No focal deficit present.     Mental Status: She is alert.  Psychiatric:        Mood and Affect: Mood normal.        Behavior: Behavior normal.  LABORATORY DATA:  CBC    Component Value Date/Time   WBC 3.7 (L) 05/30/2024 0919   RBC 3.73 (L) 05/30/2024 0919   HGB 11.1 (L) 05/30/2024 0919   HGB 10.3 (L) 01/29/2024 1405   HCT 34.8 (L) 05/30/2024 0919   HCT 31.5 (L) 01/29/2024 1405   PLT 182 05/30/2024 0919   PLT 189 01/29/2024 1405   MCV 93.3 05/30/2024 0919   MCV 94 01/29/2024 1405   MCH 29.8 05/30/2024 0919   MCHC 31.9 05/30/2024 0919   RDW 15.0 05/30/2024 0919   RDW 14.1 01/29/2024  1405   LYMPHSABS 1.3 03/07/2024 1009   LYMPHSABS 1.6 01/29/2024 1405   MONOABS 0.4 03/07/2024 1009   EOSABS 0.2 03/07/2024 1009   EOSABS 0.2 01/29/2024 1405   BASOSABS 0.0 03/07/2024 1009   BASOSABS 0.0 01/29/2024 1405    CMP     Component Value Date/Time   NA 139 05/30/2024 0919   NA 136 01/29/2024 1405   K 4.2 05/30/2024 0919   CL 107 05/30/2024 0919   CO2 23 05/30/2024 0919   GLUCOSE 120 (H) 05/30/2024 0919   BUN 23 05/30/2024 0919   BUN 21 01/29/2024 1405   CREATININE 1.28 (H) 05/30/2024 0919   CREATININE 1.38 (H) 03/05/2022 0806   CREATININE 1.22 (H) 04/29/2016 0914   CALCIUM  10.1 05/30/2024 0919   PROT 7.2 01/29/2024 1405   ALBUMIN 3.7 (L) 01/29/2024 1405   AST 23 01/29/2024 1405   AST 22 03/05/2022 0806   ALT 12 01/29/2024 1405   ALT 14 03/05/2022 0806   ALKPHOS 66 01/29/2024 1405   BILITOT 0.4 01/29/2024 1405   BILITOT 0.6 03/05/2022 0806   GFRNONAA 43 (L) 05/30/2024 0919   GFRNONAA 40 (L) 03/05/2022 0806   GFRNONAA 46 (L) 04/29/2016 0914   GFRAA 46 (L) 04/30/2020 1505   GFRAA 53 (L) 04/29/2016 0914     ASSESSMENT and THERAPY PLAN:   No problem-specific Assessment & Plan notes found for this encounter.  All questions were answered. The patient knows to call the clinic with any problems, questions or concerns. We can certainly see the patient much sooner if necessary.  Total encounter time:20 minutes*in face-to-face visit time, chart review, lab review, care coordination, order entry, and documentation of the encounter time.    Morna Kendall, NP 06/28/24 2:19 PM Medical Oncology and Hematology [facility] [address] [place], [place] [address] Tel. [phone]    Fax. [phone]  *Total Encounter Time as defined by the Centers for Medicare and Medicaid Services includes, in addition to the face-to-face time of a patient visit (documented in the note above) non-face-to-face time: obtaining and reviewing outside history, ordering and  reviewing medications, tests or procedures, care coordination (communications with other health care professionals or caregivers) and documentation in the medical record.

## 2024-06-29 ENCOUNTER — Telehealth: Payer: Self-pay

## 2024-06-29 NOTE — Telephone Encounter (Signed)
-----   Message from Deemston Iruku sent at 06/28/2024  2:26 PM EST ----- Can we fax the order for mammo and bone density to [facility] She would like them both on the same day please.  Thanks,

## 2024-06-29 NOTE — Assessment & Plan Note (Signed)
 Erin Coffey is a 78 year old woman with history of right breast DCIS diagnosed in July 2023 status postlumpectomy, adjuvant radiation, and antiestrogen therapy with anastrozole  which began in November 2023. She is now on adj anastrozole 

## 2024-06-29 NOTE — Telephone Encounter (Signed)
 Orders faxed to [facility]. Confirmation received

## 2024-07-01 ENCOUNTER — Other Ambulatory Visit: Payer: Self-pay

## 2024-07-01 DIAGNOSIS — I10 Essential (primary) hypertension: Secondary | ICD-10-CM

## 2024-07-01 DIAGNOSIS — E119 Type 2 diabetes mellitus without complications: Secondary | ICD-10-CM

## 2024-07-01 MED ORDER — LOSARTAN POTASSIUM 100 MG PO TABS
100.0000 mg | ORAL_TABLET | Freq: Every day | ORAL | 1 refills | Status: AC
Start: 2024-07-01 — End: 2024-12-28

## 2024-07-18 ENCOUNTER — Ambulatory Visit: Admitting: Urology

## 2024-07-27 DIAGNOSIS — I2699 Other pulmonary embolism without acute cor pulmonale: Secondary | ICD-10-CM

## 2024-07-27 DIAGNOSIS — E785 Hyperlipidemia, unspecified: Secondary | ICD-10-CM | POA: Diagnosis not present

## 2024-07-27 DIAGNOSIS — Z01810 Encounter for preprocedural cardiovascular examination: Secondary | ICD-10-CM | POA: Diagnosis not present

## 2024-07-27 DIAGNOSIS — Z86711 Personal history of pulmonary embolism: Secondary | ICD-10-CM | POA: Diagnosis not present

## 2024-07-27 DIAGNOSIS — I493 Ventricular premature depolarization: Secondary | ICD-10-CM

## 2024-07-27 DIAGNOSIS — E1169 Type 2 diabetes mellitus with other specified complication: Secondary | ICD-10-CM

## 2024-07-27 DIAGNOSIS — I2510 Atherosclerotic heart disease of native coronary artery without angina pectoris: Secondary | ICD-10-CM

## 2024-07-28 ENCOUNTER — Ambulatory Visit: Payer: Self-pay | Admitting: Cardiology

## 2024-08-08 ENCOUNTER — Other Ambulatory Visit: Payer: Self-pay | Admitting: Family Medicine

## 2024-08-16 ENCOUNTER — Other Ambulatory Visit: Payer: Self-pay | Admitting: Adult Health

## 2024-08-16 DIAGNOSIS — M545 Low back pain, unspecified: Secondary | ICD-10-CM

## 2024-08-18 ENCOUNTER — Encounter

## 2024-08-21 NOTE — Progress Notes (Unsigned)
 " Assessment: HG NMIBC--s/p TURBT 10.27.2025.  She is doing well.  Here today to start induction BCG  Plan: - She was given BCG 1/6 today  - Multiple questions answered  - We will have her come back in 1 week for her second induction instillation  History of Present Illness:  8.4.2025: Initial visit for evaluation of bladder abnormality seen on recent CT scan.  She presented to the emergency room about 10 days after sustaining a fall at church.  She is on Eliquis .  CT findings: 1. No acute traumatic injury in the chest, abdomen, or pelvis. 2. Mild pericholecystic haziness, nonspecific in the setting of a nondistended gallbladder. Recommend correlation with right upper quadrant pain. 3. New enhancing foci within the anterior right bladder wall measuring up to 9 x 8 mm, suspicious for bladder neoplasm. Recommend urology consultation. 4. Surgical clips in the right breast adjacent to a 4.8 x 3.6 cm fluid collection, which may be postsurgical. Recommend correlation with dedicated breast imaging. 5. Aortic Atherosclerosis (ICD10-I70.0). Coronary artery calcifications. Assessment for potential risk factor modification, dietary therapy or pharmacologic therapy may be warranted, if clinically indicated.  Since that visit, she has had 1 episode of gross painless hematuria.  She does not have history with high risk factors for urothelial carcinoma.  9.8.2025: Office cysto negative but cytology positive.  10.27.2025: Cysto/TURBT/gemcitabine  instillation. Somewhat difficult anesthetic induction. BTs seen Rt anterior bladder wall. Path--HG urothelial carcinoma w/ LP invasion.Muscularis not seen.  11.10.2025: Here today for postsurgical follow-up.  She is voiding well.  No blood noted in urine, she has had no dysuria.  1.12.2026: Here today to start BCG induction  Past Medical History:  Past Medical History:  Diagnosis Date   Allergy    Anemia, iron  deficiency    ANXIETY, SITUATIONAL  04/06/2009   Qualifier: Diagnosis of  By: Flint  MD, Stephanie     Arthritis    Back pain, thoracic 12/20/2012   Cancer (HCC)    right breast   Carpal tunnel syndrome 12/07/2014   Cholelithiasis 06/25/2012   Chronic kidney disease    DM type 2 goal A1C below 7.5 06/05/2008   Qualifier: Diagnosis of  By: Flint  MD, Corean     Family history of breast cancer 03/05/2022   GASTROESOPHAGEAL REFLUX, NO ESOPHAGITIS 10/08/2006   Qualifier: Diagnosis of  By: Manford Rocky Saliva, unspecified 10/21/2007   Qualifier: Diagnosis of  By: ROJELIO MD, [name]     Heart murmur    History of DVT (deep vein thrombosis) 05/05/2013   on Eliquis    HYPERLIPIDEMIA 08/08/2008   Qualifier: Diagnosis of  By: Flint  MD, Stephanie     HYPERTENSION, BENIGN ESSENTIAL 11/27/2006   Qualifier: Diagnosis of  ByBETHA ROJELIO MD, [name]     Mild coronary artery disease 10/2019   Coronary CT Angiogram: Coronary calcium  score 46.Normal coronary origin.  Left dominance with tortuous coronary arteries.  Nondominant RCA - prox < 25% followed by ectasia (dilated to 5.5 mm) mixed plaque in distal vessel.  LM- normal . LAD: minimal mixed plaque in ostial & proximal (<25%) with brief IM bridging in mLAD. Mild mid-distal (25-49%), RI - prox <25%, Large LCx- minimal (<25%) mid-dista   Mild coronary artery disease    Personal history of colonic polyps 10/24/2009   Qualifier: Diagnosis of  By: Debrah MD, Lamar BIRCH    Pica 08/08/2009   Qualifier: Diagnosis of  By: Jordan, [name]     Pituitary adenoma [facility])  Polymyalgia rheumatica 10/02/2009   Qualifier: Diagnosis of  By: Rickard MD, Vernell     Pulmonary embolism [facility]) - On long Term anticoagulation (Z79.01) 04/28/2011   Converted from warfarin to Eliquis ; on long-term anticoagulation.   Thyroid  disease    Trigger ring finger of left hand 11/02/2014    Past Surgical History:  Past Surgical History:  Procedure Laterality Date   ABDOMINAL HYSTERECTOMY     BLADDER INSTILLATION N/A  06/06/2024   Procedure: INSTILLATION, BLADDER;  Surgeon: Matilda Senior, MD;  Location: [facility];  Service: Urology;  Laterality: N/A;  gemcitabine  installation   BREAST LUMPECTOMY WITH RADIOACTIVE SEED LOCALIZATION Right 03/25/2022   Procedure: RIGHT BREAST SEED LUMPECTOMY;  Surgeon: Vanderbilt Ned, MD;  Location: [facility];  Service: General;  Laterality: Right;   CARDIAC CATHETERIZATION  04/29/2011   (Dr. Morris - [place]): LM - normal. LAD with 1 major Diag - normal, bifurcating Ramus - normal, Large (6-7 mm) Dominant LCx with 1 OM, 2 LPL branches & LPDA - non significant disease; small non-dominant RCA.     CARDIAC EVENT MONITOR  09/2019   Mostly sinus rhythm, average rate 60 bpm.  Range 45 to 190 bpm.  No critical events indicating any arrhythmias or pauses.  For patient responses noted PVCs.  Less than 1% PVCs noted.    CARPAL TUNNEL RELEASE Right 1996   COLONOSCOPY     CYSTOSCOPY N/A 06/06/2024   Procedure: CYSTOSCOPY;  Surgeon: Matilda Senior, MD;  Location[facility];  Service: Urology;  Laterality: N/A;   KNEE ARTHROSCOPY  Bilateral   masctomy     TRANSTHORACIC ECHOCARDIOGRAM  09/2019   Normal EF - 60 to 65%.  Mild LVH.  GR 1 DD.  Normal RV.  Trivial MR.  Normal IVC.  Normal PA pressures.   TUBAL LIGATION     UPPER GASTROINTESTINAL ENDOSCOPY      Allergies:  Allergies  Allergen Reactions   Ciprofloxacin  Itching   Lisinopril Cough    Family History:  Family History  Problem Relation Age of Onset   Diabetes Mother    Heart disease Mother    Hypertension Mother    Heart disease Father    Hypertension Father    Diabetes Sister    Heart disease Sister    Leukemia Sister 43   Cirrhosis Sister        alcohol   Kidney disease Sister    Diabetes Sister    Hypertension Brother    Diabetes Brother    Breast cancer Cousin        dx 12s; maternal female cousin   Colon cancer Neg Hx    Pancreatic cancer Neg Hx    Esophageal cancer Neg Hx      Social History:  Social History   Tobacco Use   Smoking status: Never    Passive exposure: Never   Smokeless tobacco: Never  Vaping Use   Vaping status: Never Used  Substance Use Topics   Alcohol use: No    Alcohol/week: 0.0 standard drinks of alcohol   Drug use: No     Analysis today was clear  Pathology reviewed  Prior notes reviewed   "

## 2024-08-22 ENCOUNTER — Encounter: Payer: Self-pay | Admitting: Urology

## 2024-08-22 ENCOUNTER — Ambulatory Visit: Admitting: Urology

## 2024-08-22 VITALS — BP 150/66 | HR 60 | Ht 68.0 in | Wt 265.0 lb

## 2024-08-22 DIAGNOSIS — C679 Malignant neoplasm of bladder, unspecified: Secondary | ICD-10-CM | POA: Diagnosis not present

## 2024-08-22 DIAGNOSIS — C673 Malignant neoplasm of anterior wall of bladder: Secondary | ICD-10-CM

## 2024-08-22 LAB — MICROSCOPIC EXAMINATION: Bacteria, UA: NONE SEEN

## 2024-08-22 LAB — URINALYSIS, ROUTINE W REFLEX MICROSCOPIC
Bilirubin, UA: NEGATIVE
Glucose, UA: NEGATIVE
Ketones, UA: NEGATIVE
Nitrite, UA: NEGATIVE
Protein,UA: NEGATIVE
RBC, UA: NEGATIVE
Specific Gravity, UA: 1.01 (ref 1.005–1.030)
Urobilinogen, Ur: 0.2 mg/dL (ref 0.2–1.0)
pH, UA: 6 (ref 5.0–7.5)

## 2024-08-22 MED ORDER — OXYBUTYNIN CHLORIDE 5 MG PO TABS: ORAL_TABLET | ORAL | 0 refills | Status: AC

## 2024-08-22 MED ORDER — BCG LIVE 50 MG IS SUSR
3.2400 mL | Freq: Once | INTRAVESICAL | Status: AC
  Administered 2024-08-22: 81 mg via INTRAVESICAL

## 2024-08-22 NOTE — Progress Notes (Unsigned)
 BCG Bladder Instillation  BCG # 1  Due to Bladder Cancer patient is present today for a BCG treatment. Patient was cleaned and prepped in a sterile fashion with betadine. A 14FR catheter was inserted, urine return was noted 50ml, urine was yellow in color.  50ml of reconstituted BCG was instilled into the bladder. The catheter was then removed. Patient tolerated well, no complications were noted  Performed by: Korene Divina Neale,CMA  Follow up/ Additional notes: 1 week follow up for BCG treatment

## 2024-08-23 ENCOUNTER — Other Ambulatory Visit: Payer: Self-pay | Admitting: Family Medicine

## 2024-08-23 DIAGNOSIS — I2699 Other pulmonary embolism without acute cor pulmonale: Secondary | ICD-10-CM

## 2024-08-23 MED ORDER — ATORVASTATIN CALCIUM 40 MG PO TABS: 40.0000 mg | ORAL_TABLET | Freq: Every day | ORAL | 2 refills | Status: AC

## 2024-08-23 MED ORDER — APIXABAN 5 MG PO TABS: 5.0000 mg | ORAL_TABLET | Freq: Two times a day (BID) | ORAL | 3 refills | Status: AC

## 2024-08-24 NOTE — Progress Notes (Signed)
 " Assessment: HG NMIBC--s/p TURBT 10.27.2025.  She is doing well.  Here today to continue induction BCG  Plan: - She was given BCG 2/6 today  - We will have her come back in 1 week for her third induction instillation  History of Present Illness:  8.4.2025: Initial visit for evaluation of bladder abnormality seen on recent CT scan.  She presented to the emergency room about 10 days after sustaining a fall at church.  She is on Eliquis .  CT findings: 1. No acute traumatic injury in the chest, abdomen, or pelvis. 2. Mild pericholecystic haziness, nonspecific in the setting of a nondistended gallbladder. Recommend correlation with right upper quadrant pain. 3. New enhancing foci within the anterior right bladder wall measuring up to 9 x 8 mm, suspicious for bladder neoplasm. Recommend urology consultation. 4. Surgical clips in the right breast adjacent to a 4.8 x 3.6 cm fluid collection, which may be postsurgical. Recommend correlation with dedicated breast imaging. 5. Aortic Atherosclerosis (ICD10-I70.0). Coronary artery calcifications. Assessment for potential risk factor modification, dietary therapy or pharmacologic therapy may be warranted, if clinically indicated.  Since that visit, she has had 1 episode of gross painless hematuria.  She does not have history with high risk factors for urothelial carcinoma.  9.8.2025: Office cysto negative but cytology positive.  10.27.2025: Cysto/TURBT/gemcitabine  instillation. Somewhat difficult anesthetic induction. BTs seen Rt anterior bladder wall. Path--HG urothelial carcinoma w/ LP invasion.Muscularis not seen.  11.10.2025: Here today for postsurgical follow-up.  She is voiding well.  No blood noted in urine, she has had no dysuria.  1.12.2026: Here today to start BCG induction  1.19.2026: Here today to continue BCG induction with instillation of 2/6  Past Medical History:  Past Medical History:  Diagnosis Date   Allergy     Anemia, iron  deficiency    ANXIETY, SITUATIONAL 04/06/2009   Qualifier: Diagnosis of  By: Flint  MD, Stephanie     Arthritis    Back pain, thoracic 12/20/2012   Cancer (HCC)    right breast   Carpal tunnel syndrome 12/07/2014   Cholelithiasis 06/25/2012   Chronic kidney disease    DM type 2 goal A1C below 7.5 06/05/2008   Qualifier: Diagnosis of  By: Flint  MD, Corean     Family history of breast cancer 03/05/2022   GASTROESOPHAGEAL REFLUX, NO ESOPHAGITIS 10/08/2006   Qualifier: Diagnosis of  By: Manford Rocky Saliva, unspecified 10/21/2007   Qualifier: Diagnosis of  By: ROJELIO MD, [name]     Heart murmur    History of DVT (deep vein thrombosis) 05/05/2013   on Eliquis    HYPERLIPIDEMIA 08/08/2008   Qualifier: Diagnosis of  By: Flint  MD, Stephanie     HYPERTENSION, BENIGN ESSENTIAL 11/27/2006   Qualifier: Diagnosis of  ByBETHA ROJELIO MD, [name]     Mild coronary artery disease 10/2019   Coronary CT Angiogram: Coronary calcium  score 46.Normal coronary origin.  Left dominance with tortuous coronary arteries.  Nondominant RCA - prox < 25% followed by ectasia (dilated to 5.5 mm) mixed plaque in distal vessel.  LM- normal . LAD: minimal mixed plaque in ostial & proximal (<25%) with brief IM bridging in mLAD. Mild mid-distal (25-49%), RI - prox <25%, Large LCx- minimal (<25%) mid-dista   Mild coronary artery disease    Personal history of colonic polyps 10/24/2009   Qualifier: Diagnosis of  By: Debrah MD, Lamar BIRCH    Pica 08/08/2009   Qualifier: Diagnosis of  By: Jordan, [name]  Pituitary adenoma (HCC)    Polymyalgia rheumatica 10/02/2009   Qualifier: Diagnosis of  By: Rickard MD, Vernell     Pulmonary embolism [facility]) - On long Term anticoagulation (Z79.01) 04/28/2011   Converted from warfarin to Eliquis ; on long-term anticoagulation.   Thyroid  disease    Trigger ring finger of left hand 11/02/2014    Past Surgical History:  Past Surgical History:  Procedure Laterality Date    ABDOMINAL HYSTERECTOMY     BLADDER INSTILLATION N/A 06/06/2024   Procedure: INSTILLATION, BLADDER;  Surgeon: Matilda Senior, MD;  Location: [facility];  Service: Urology;  Laterality: N/A;  gemcitabine  installation   BREAST LUMPECTOMY WITH RADIOACTIVE SEED LOCALIZATION Right 03/25/2022   Procedure: RIGHT BREAST SEED LUMPECTOMY;  Surgeon: Vanderbilt Ned, MD;  Location: [facility];  Service: General;  Laterality: Right;   CARDIAC CATHETERIZATION  04/29/2011   (Dr. Morris - [place]): LM - normal. LAD with 1 major Diag - normal, bifurcating Ramus - normal, Large (6-7 mm) Dominant LCx with 1 OM, 2 LPL branches & LPDA - non significant disease; small non-dominant RCA.     CARDIAC EVENT MONITOR  09/2019   Mostly sinus rhythm, average rate 60 bpm.  Range 45 to 190 bpm.  No critical events indicating any arrhythmias or pauses.  For patient responses noted PVCs.  Less than 1% PVCs noted.    CARPAL TUNNEL RELEASE Right 1996   COLONOSCOPY     CYSTOSCOPY N/A 06/06/2024   Procedure: CYSTOSCOPY;  Surgeon: Matilda Senior, MD;  Location: [facility];  Service: Urology;  Laterality: N/A;   KNEE ARTHROSCOPY  Bilateral   masctomy     TRANSTHORACIC ECHOCARDIOGRAM  09/2019   Normal EF - 60 to 65%.  Mild LVH.  GR 1 DD.  Normal RV.  Trivial MR.  Normal IVC.  Normal PA pressures.   TUBAL LIGATION     UPPER GASTROINTESTINAL ENDOSCOPY      Allergies:  Allergies  Allergen Reactions   Ciprofloxacin  Itching   Lisinopril Cough    Family History:  Family History  Problem Relation Age of Onset   Diabetes Mother    Heart disease Mother    Hypertension Mother    Heart disease Father    Hypertension Father    Diabetes Sister    Heart disease Sister    Leukemia Sister 48   Cirrhosis Sister        alcohol   Kidney disease Sister    Diabetes Sister    Hypertension Brother    Diabetes Brother    Breast cancer Cousin        dx 42s; maternal female cousin   Colon cancer Neg Hx    Pancreatic  cancer Neg Hx    Esophageal cancer Neg Hx     Social History:  Social History   Tobacco Use   Smoking status: Never    Passive exposure: Never   Smokeless tobacco: Never  Vaping Use   Vaping status: Never Used  Substance Use Topics   Alcohol use: No    Alcohol/week: 0.0 standard drinks of alcohol   Drug use: No     Urinalysis reviewed.  Clear  "

## 2024-08-29 ENCOUNTER — Ambulatory Visit: Admitting: Urology

## 2024-08-29 DIAGNOSIS — C679 Malignant neoplasm of bladder, unspecified: Secondary | ICD-10-CM | POA: Diagnosis not present

## 2024-08-29 DIAGNOSIS — C673 Malignant neoplasm of anterior wall of bladder: Secondary | ICD-10-CM

## 2024-08-29 LAB — MICROSCOPIC EXAMINATION: Bacteria, UA: NONE SEEN

## 2024-08-29 LAB — URINALYSIS, ROUTINE W REFLEX MICROSCOPIC
Bilirubin, UA: NEGATIVE
Glucose, UA: NEGATIVE
Ketones, UA: NEGATIVE
Leukocytes,UA: NEGATIVE
Nitrite, UA: NEGATIVE
RBC, UA: NEGATIVE
Urobilinogen, Ur: 0.2 mg/dL (ref 0.2–1.0)
pH, UA: 6 (ref 5.0–7.5)

## 2024-08-29 MED ORDER — BCG LIVE 50 MG IS SUSR
3.2400 mL | Freq: Once | INTRAVESICAL | Status: AC
  Administered 2024-08-29: 81 mg via INTRAVESICAL

## 2024-08-29 NOTE — Progress Notes (Signed)
 BCG Bladder Instillation  BCG # 2  Due to Bladder Cancer patient is present today for a BCG treatment. Patient was cleaned and prepped in a sterile fashion with betadine. A 14FR catheter was inserted, urine return was noted , urine was yellow in color.  50ml of reconstituted BCG was instilled into the bladder. The catheter was then removed. Patient tolerated well, no complications were noted  Performed by: Hutchinson Isenberg,CMA   Follow up/ Additional notes:follow up in 1 week

## 2024-08-31 NOTE — Progress Notes (Unsigned)
 " Assessment: HG NMIBC--s/p TURBT 10.27.2025.  She is doing well.  Here today to continue induction BCG  Plan: - She was given BCG 2/6 today  - We will have her come back in 1 week for her third induction instillation  History of Present Illness:  8.4.2025: Initial visit for evaluation of bladder abnormality seen on recent CT scan.  She presented to the emergency room about 10 days after sustaining a fall at church.  She is on Eliquis .  CT findings: 1. No acute traumatic injury in the chest, abdomen, or pelvis. 2. Mild pericholecystic haziness, nonspecific in the setting of a nondistended gallbladder. Recommend correlation with right upper quadrant pain. 3. New enhancing foci within the anterior right bladder wall measuring up to 9 x 8 mm, suspicious for bladder neoplasm. Recommend urology consultation. 4. Surgical clips in the right breast adjacent to a 4.8 x 3.6 cm fluid collection, which may be postsurgical. Recommend correlation with dedicated breast imaging. 5. Aortic Atherosclerosis (ICD10-I70.0). Coronary artery calcifications. Assessment for potential risk factor modification, dietary therapy or pharmacologic therapy may be warranted, if clinically indicated.  Since that visit, she has had 1 episode of gross painless hematuria.  She does not have history with high risk factors for urothelial carcinoma.  9.8.2025: Office cysto negative but cytology positive.  10.27.2025: Cysto/TURBT/gemcitabine  instillation. Somewhat difficult anesthetic induction. BTs seen Rt anterior bladder wall. Path--HG urothelial carcinoma w/ LP invasion.Muscularis not seen.  11.10.2025: Here today for postsurgical follow-up.  She is voiding well.  No blood noted in urine, she has had no dysuria.  1.12.2026: Here today to start BCG induction  1.19.2026: Here today to continue BCG induction with instillation of 2/6  1.26.2026: She comes in today for continuation of BCG induction-instillation  3/6.  Past Medical History:  Past Medical History:  Diagnosis Date   Allergy    Anemia, iron  deficiency    ANXIETY, SITUATIONAL 04/06/2009   Qualifier: Diagnosis of  By: Flint  MD, Stephanie     Arthritis    Back pain, thoracic 12/20/2012   Cancer (HCC)    right breast   Carpal tunnel syndrome 12/07/2014   Cholelithiasis 06/25/2012   Chronic kidney disease    DM type 2 goal A1C below 7.5 06/05/2008   Qualifier: Diagnosis of  By: Flint  MD, Corean     Family history of breast cancer 03/05/2022   GASTROESOPHAGEAL REFLUX, NO ESOPHAGITIS 10/08/2006   Qualifier: Diagnosis of  By: Manford Rocky Saliva, unspecified 10/21/2007   Qualifier: Diagnosis of  By: ROJELIO MD, [name]     Heart murmur    History of DVT (deep vein thrombosis) 05/05/2013   on Eliquis    HYPERLIPIDEMIA 08/08/2008   Qualifier: Diagnosis of  By: Flint  MD, Stephanie     HYPERTENSION, BENIGN ESSENTIAL 11/27/2006   Qualifier: Diagnosis of  ByBETHA ROJELIO MD, [name]     Mild coronary artery disease 10/2019   Coronary CT Angiogram: Coronary calcium  score 46.Normal coronary origin.  Left dominance with tortuous coronary arteries.  Nondominant RCA - prox < 25% followed by ectasia (dilated to 5.5 mm) mixed plaque in distal vessel.  LM- normal . LAD: minimal mixed plaque in ostial & proximal (<25%) with brief IM bridging in mLAD. Mild mid-distal (25-49%), RI - prox <25%, Large LCx- minimal (<25%) mid-dista   Mild coronary artery disease    Personal history of colonic polyps 10/24/2009   Qualifier: Diagnosis of  By: Debrah MD, Lamar BIRCH    Pica  08/08/2009   Qualifier: Diagnosis of  By: Jordan, [name]     Pituitary adenoma [facility])    Polymyalgia rheumatica 10/02/2009   Qualifier: Diagnosis of  By: Rickard MD, Vernell     Pulmonary embolism [facility]) - On long Term anticoagulation (Z79.01) 04/28/2011   Converted from warfarin to Eliquis ; on long-term anticoagulation.   Thyroid  disease    Trigger ring finger of left hand 11/02/2014     Past Surgical History:  Past Surgical History:  Procedure Laterality Date   ABDOMINAL HYSTERECTOMY     BLADDER INSTILLATION N/A 06/06/2024   Procedure: INSTILLATION, BLADDER;  Surgeon: Matilda Senior, MD;  Location: [facility];  Service: Urology;  Laterality: N/A;  gemcitabine  installation   BREAST LUMPECTOMY WITH RADIOACTIVE SEED LOCALIZATION Right 03/25/2022   Procedure: RIGHT BREAST SEED LUMPECTOMY;  Surgeon: Vanderbilt Ned, MD;  Location: [facility];  Service: General;  Laterality: Right;   CARDIAC CATHETERIZATION  04/29/2011   (Dr. Morris - [place]): LM - normal. LAD with 1 major Diag - normal, bifurcating Ramus - normal, Large (6-7 mm) Dominant LCx with 1 OM, 2 LPL branches & LPDA - non significant disease; small non-dominant RCA.     CARDIAC EVENT MONITOR  09/2019   Mostly sinus rhythm, average rate 60 bpm.  Range 45 to 190 bpm.  No critical events indicating any arrhythmias or pauses.  For patient responses noted PVCs.  Less than 1% PVCs noted.    CARPAL TUNNEL RELEASE Right 1996   COLONOSCOPY     CYSTOSCOPY N/A 06/06/2024   Procedure: CYSTOSCOPY;  Surgeon: Matilda Senior, MD;  Location[facility];  Service: Urology;  Laterality: N/A;   KNEE ARTHROSCOPY  Bilateral   masctomy     TRANSTHORACIC ECHOCARDIOGRAM  09/2019   Normal EF - 60 to 65%.  Mild LVH.  GR 1 DD.  Normal RV.  Trivial MR.  Normal IVC.  Normal PA pressures.   TUBAL LIGATION     UPPER GASTROINTESTINAL ENDOSCOPY      Allergies:  Allergies  Allergen Reactions   Ciprofloxacin  Itching   Lisinopril Cough    Family History:  Family History  Problem Relation Age of Onset   Diabetes Mother    Heart disease Mother    Hypertension Mother    Heart disease Father    Hypertension Father    Diabetes Sister    Heart disease Sister    Leukemia Sister 39   Cirrhosis Sister        alcohol   Kidney disease Sister    Diabetes Sister    Hypertension Brother    Diabetes Brother    Breast cancer  Cousin        dx 1s; maternal female cousin   Colon cancer Neg Hx    Pancreatic cancer Neg Hx    Esophageal cancer Neg Hx     Social History:  Social History   Tobacco Use   Smoking status: Never    Passive exposure: Never   Smokeless tobacco: Never  Vaping Use   Vaping status: Never Used  Substance Use Topics   Alcohol use: No    Alcohol/week: 0.0 standard drinks of alcohol   Drug use: No     Urinalysis reviewed.  Clear  "

## 2024-09-05 ENCOUNTER — Ambulatory Visit: Admitting: Urology

## 2024-09-05 DIAGNOSIS — C673 Malignant neoplasm of anterior wall of bladder: Secondary | ICD-10-CM

## 2024-09-07 ENCOUNTER — Other Ambulatory Visit: Payer: Self-pay

## 2024-09-07 ENCOUNTER — Telehealth: Payer: Self-pay

## 2024-09-07 DIAGNOSIS — C673 Malignant neoplasm of anterior wall of bladder: Secondary | ICD-10-CM

## 2024-09-07 NOTE — Telephone Encounter (Signed)
 Pt called office today w/ c/o an increase in frequency since Tuesday 1/20 following her BCG treatment, some dysuria and some pain after urinating. Pt's BCG treatment for this week was postponed due to weather. Pt states she can drop off a sample for culture tomorrow to the lab. Advised pt I would let the provider know. Orders placed.

## 2024-09-07 NOTE — Progress Notes (Unsigned)
 " Assessment: HG NMIBC--s/p TURBT 10.27.2025.  She is doing well.  Here today to continue induction BCG  Plan: - She was given BCG 2/6 today  - We will have her come back in 1 week for her third induction instillation  History of Present Illness:  8.4.2025: Initial visit for evaluation of bladder abnormality seen on recent CT scan.  She presented to the emergency room about 10 days after sustaining a fall at church.  She is on Eliquis .  CT findings: 1. No acute traumatic injury in the chest, abdomen, or pelvis. 2. Mild pericholecystic haziness, nonspecific in the setting of a nondistended gallbladder. Recommend correlation with right upper quadrant pain. 3. New enhancing foci within the anterior right bladder wall measuring up to 9 x 8 mm, suspicious for bladder neoplasm. Recommend urology consultation. 4. Surgical clips in the right breast adjacent to a 4.8 x 3.6 cm fluid collection, which may be postsurgical. Recommend correlation with dedicated breast imaging. 5. Aortic Atherosclerosis (ICD10-I70.0). Coronary artery calcifications. Assessment for potential risk factor modification, dietary therapy or pharmacologic therapy may be warranted, if clinically indicated.  Since that visit, she has had 1 episode of gross painless hematuria.  She does not have history with high risk factors for urothelial carcinoma.  9.8.2025: Office cysto negative but cytology positive.  10.27.2025: Cysto/TURBT/gemcitabine  instillation. Somewhat difficult anesthetic induction. BTs seen Rt anterior bladder wall. Path--HG urothelial carcinoma w/ LP invasion.Muscularis not seen.  11.10.2025: Here today for postsurgical follow-up.  She is voiding well.  No blood noted in urine, she has had no dysuria.  1.12.2026: Here today to start BCG induction  1.19.2026: Here today to continue BCG induction with instillation of 2/6  2.2.2026: She comes in today for continuation of BCG induction-instillation  3/6.  Past Medical History:  Past Medical History:  Diagnosis Date   Allergy    Anemia, iron  deficiency    ANXIETY, SITUATIONAL 04/06/2009   Qualifier: Diagnosis of  By: Flint  MD, Stephanie     Arthritis    Back pain, thoracic 12/20/2012   Cancer (HCC)    right breast   Carpal tunnel syndrome 12/07/2014   Cholelithiasis 06/25/2012   Chronic kidney disease    DM type 2 goal A1C below 7.5 06/05/2008   Qualifier: Diagnosis of  By: Flint  MD, Corean     Family history of breast cancer 03/05/2022   GASTROESOPHAGEAL REFLUX, NO ESOPHAGITIS 10/08/2006   Qualifier: Diagnosis of  By: Manford Rocky Saliva, unspecified 10/21/2007   Qualifier: Diagnosis of  By: ROJELIO MD, [name]     Heart murmur    History of DVT (deep vein thrombosis) 05/05/2013   on Eliquis    HYPERLIPIDEMIA 08/08/2008   Qualifier: Diagnosis of  By: Flint  MD, Stephanie     HYPERTENSION, BENIGN ESSENTIAL 11/27/2006   Qualifier: Diagnosis of  ByBETHA ROJELIO MD, [name]     Mild coronary artery disease 10/2019   Coronary CT Angiogram: Coronary calcium  score 46.Normal coronary origin.  Left dominance with tortuous coronary arteries.  Nondominant RCA - prox < 25% followed by ectasia (dilated to 5.5 mm) mixed plaque in distal vessel.  LM- normal . LAD: minimal mixed plaque in ostial & proximal (<25%) with brief IM bridging in mLAD. Mild mid-distal (25-49%), RI - prox <25%, Large LCx- minimal (<25%) mid-dista   Mild coronary artery disease    Personal history of colonic polyps 10/24/2009   Qualifier: Diagnosis of  By: Debrah MD, Lamar BIRCH    Pica  08/08/2009   Qualifier: Diagnosis of  By: Jordan, [name]     Pituitary adenoma [facility])    Polymyalgia rheumatica 10/02/2009   Qualifier: Diagnosis of  By: Rickard MD, Vernell     Pulmonary embolism [facility]) - On long Term anticoagulation (Z79.01) 04/28/2011   Converted from warfarin to Eliquis ; on long-term anticoagulation.   Thyroid  disease    Trigger ring finger of left hand 11/02/2014     Past Surgical History:  Past Surgical History:  Procedure Laterality Date   ABDOMINAL HYSTERECTOMY     BLADDER INSTILLATION N/A 06/06/2024   Procedure: INSTILLATION, BLADDER;  Surgeon: Matilda Senior, MD;  Location: [facility];  Service: Urology;  Laterality: N/A;  gemcitabine  installation   BREAST LUMPECTOMY WITH RADIOACTIVE SEED LOCALIZATION Right 03/25/2022   Procedure: RIGHT BREAST SEED LUMPECTOMY;  Surgeon: Vanderbilt Ned, MD;  Location: [facility];  Service: General;  Laterality: Right;   CARDIAC CATHETERIZATION  04/29/2011   (Dr. Morris - [place]): LM - normal. LAD with 1 major Diag - normal, bifurcating Ramus - normal, Large (6-7 mm) Dominant LCx with 1 OM, 2 LPL branches & LPDA - non significant disease; small non-dominant RCA.     CARDIAC EVENT MONITOR  09/2019   Mostly sinus rhythm, average rate 60 bpm.  Range 45 to 190 bpm.  No critical events indicating any arrhythmias or pauses.  For patient responses noted PVCs.  Less than 1% PVCs noted.    CARPAL TUNNEL RELEASE Right 1996   COLONOSCOPY     CYSTOSCOPY N/A 06/06/2024   Procedure: CYSTOSCOPY;  Surgeon: Matilda Senior, MD;  Location: [facility];  Service: Urology;  Laterality: N/A;   KNEE ARTHROSCOPY  Bilateral   masctomy     TRANSTHORACIC ECHOCARDIOGRAM  09/2019   Normal EF - 60 to 65%.  Mild LVH.  GR 1 DD.  Normal RV.  Trivial MR.  Normal IVC.  Normal PA pressures.   TUBAL LIGATION     UPPER GASTROINTESTINAL ENDOSCOPY      Allergies:  Allergies  Allergen Reactions   Ciprofloxacin  Itching   Lisinopril Cough    Family History:  Family History  Problem Relation Age of Onset   Diabetes Mother    Heart disease Mother    Hypertension Mother    Heart disease Father    Hypertension Father    Diabetes Sister    Heart disease Sister    Leukemia Sister 77   Cirrhosis Sister        alcohol   Kidney disease Sister    Diabetes Sister    Hypertension Brother    Diabetes Brother    Breast cancer  Cousin        dx 32s; maternal female cousin   Colon cancer Neg Hx    Pancreatic cancer Neg Hx    Esophageal cancer Neg Hx     Social History:  Social History   Tobacco Use   Smoking status: Never    Passive exposure: Never   Smokeless tobacco: Never  Vaping Use   Vaping status: Never Used  Substance Use Topics   Alcohol use: No    Alcohol/week: 0.0 standard drinks of alcohol   Drug use: No     Urinalysis reviewed.  Clear  "

## 2024-09-08 ENCOUNTER — Other Ambulatory Visit

## 2024-09-08 DIAGNOSIS — C673 Malignant neoplasm of anterior wall of bladder: Secondary | ICD-10-CM

## 2024-09-08 LAB — URINALYSIS, ROUTINE W REFLEX MICROSCOPIC
Bilirubin, UA: NEGATIVE
Glucose, UA: NEGATIVE
Ketones, UA: NEGATIVE
Nitrite, UA: NEGATIVE
Specific Gravity, UA: 1.01 (ref 1.005–1.030)
Urobilinogen, Ur: 0.2 mg/dL (ref 0.2–1.0)
pH, UA: 6 (ref 5.0–7.5)

## 2024-09-08 LAB — MICROSCOPIC EXAMINATION: WBC, UA: >30 /HPF — AB (ref 0–5)

## 2024-09-09 ENCOUNTER — Emergency Department (HOSPITAL_COMMUNITY)
Admission: EM | Admit: 2024-09-09 | Discharge: 2024-09-10 | Disposition: A | Attending: Emergency Medicine | Admitting: Emergency Medicine

## 2024-09-09 ENCOUNTER — Other Ambulatory Visit: Payer: Self-pay

## 2024-09-09 ENCOUNTER — Ambulatory Visit (HOSPITAL_COMMUNITY)
Admission: EM | Admit: 2024-09-09 | Discharge: 2024-09-09 | Disposition: A | Discharge status: Home / Self Care | Attending: Emergency Medicine | Admitting: Emergency Medicine

## 2024-09-09 ENCOUNTER — Telehealth: Payer: Self-pay

## 2024-09-09 ENCOUNTER — Encounter (HOSPITAL_COMMUNITY): Payer: Self-pay

## 2024-09-09 DIAGNOSIS — F419 Anxiety disorder, unspecified: Secondary | ICD-10-CM | POA: Diagnosis not present

## 2024-09-09 DIAGNOSIS — Z7901 Long term (current) use of anticoagulants: Secondary | ICD-10-CM | POA: Diagnosis not present

## 2024-09-09 DIAGNOSIS — T3695XA Adverse effect of unspecified systemic antibiotic, initial encounter: Secondary | ICD-10-CM

## 2024-09-09 DIAGNOSIS — I2699 Other pulmonary embolism without acute cor pulmonale: Secondary | ICD-10-CM | POA: Insufficient documentation

## 2024-09-09 DIAGNOSIS — E1122 Type 2 diabetes mellitus with diabetic chronic kidney disease: Secondary | ICD-10-CM | POA: Diagnosis not present

## 2024-09-09 DIAGNOSIS — N189 Chronic kidney disease, unspecified: Secondary | ICD-10-CM | POA: Insufficient documentation

## 2024-09-09 DIAGNOSIS — Z853 Personal history of malignant neoplasm of breast: Secondary | ICD-10-CM | POA: Insufficient documentation

## 2024-09-09 DIAGNOSIS — R0602 Shortness of breath: Secondary | ICD-10-CM | POA: Insufficient documentation

## 2024-09-09 DIAGNOSIS — Z79899 Other long term (current) drug therapy: Secondary | ICD-10-CM | POA: Diagnosis not present

## 2024-09-09 DIAGNOSIS — N3000 Acute cystitis without hematuria: Secondary | ICD-10-CM | POA: Diagnosis present

## 2024-09-09 DIAGNOSIS — I129 Hypertensive chronic kidney disease with stage 1 through stage 4 chronic kidney disease, or unspecified chronic kidney disease: Secondary | ICD-10-CM | POA: Diagnosis not present

## 2024-09-09 DIAGNOSIS — T370X5A Adverse effect of sulfonamides, initial encounter: Secondary | ICD-10-CM | POA: Insufficient documentation

## 2024-09-09 LAB — POCT URINE DIPSTICK
Bilirubin, UA: NEGATIVE
Glucose, UA: NEGATIVE mg/dL
Ketones, POC UA: NEGATIVE mg/dL
Nitrite, UA: NEGATIVE
Protein Ur, POC: >=300 mg/dL — AB
Spec Grav, UA: 1.015
Urobilinogen, UA: 0.2 U/dL
pH, UA: 6

## 2024-09-09 MED ORDER — SULFAMETHOXAZOLE-TRIMETHOPRIM 800-160 MG PO TABS: 1.0000 | ORAL_TABLET | Freq: Two times a day (BID) | ORAL | 0 refills | Status: DC

## 2024-09-09 MED ORDER — FAMOTIDINE 20 MG PO TABS
20.0000 mg | ORAL_TABLET | Freq: Once | ORAL | Status: AC
  Administered 2024-09-09: 20 mg via ORAL
  Filled 2024-09-09: qty 1

## 2024-09-09 MED ORDER — CEPHALEXIN 250 MG PO CAPS
500.0000 mg | ORAL_CAPSULE | Freq: Once | ORAL | Status: AC
  Administered 2024-09-10: 500 mg via ORAL
  Filled 2024-09-09: qty 2

## 2024-09-09 MED ORDER — DIPHENHYDRAMINE HCL 25 MG PO CAPS
25.0000 mg | ORAL_CAPSULE | Freq: Once | ORAL | Status: AC
  Administered 2024-09-09: 25 mg via ORAL
  Filled 2024-09-09: qty 1

## 2024-09-09 MED ORDER — HYDROXYZINE HCL 25 MG PO TABS
25.0000 mg | ORAL_TABLET | Freq: Once | ORAL | Status: AC
  Administered 2024-09-10: 25 mg via ORAL
  Filled 2024-09-09: qty 1

## 2024-09-09 NOTE — ED Triage Notes (Addendum)
 Patient has a history of bladder cancer being treated with a surgery and continued medical treatment. Had a treatment on 08/29/24. Since then has been having painful urination and frequency.   Patient states she had urine testing performed 09-08-24 but has not been able to get in touch with her providers.

## 2024-09-09 NOTE — Telephone Encounter (Signed)
 Pt called clinic this morning with c/o burning and pain. She states her symptoms have worsened since dropping off a sample on 09/08/24. Sample was sent for culture, waiting on results. Pt would like to know if anything can be sent in before the culture results.

## 2024-09-09 NOTE — ED Provider Notes (Signed)
 " [place] [facility] AT [facility] Provider Note   CSN: [id number] Arrival date & time: 09/09/24  2141     Patient presents with: Allergic Reaction   Erin Coffey is a 79 y.o. female.  {Add pertinent medical, surgical, social history, OB history to HPI:2129} 79 year old female with a history of CKD, PE (on chronic Eliquis ), diabetes, hypertension, hyperlipidemia, and R breast cancer presents to the ED for SOB and chest tightness. States that symptoms began after taking a dose of Bactrim  around 2100. States her lower face feels funny. No tongue swelling, throat swelling, inability to swallow, drooling, wheezing, vomiting, rashes or itching. She did not take any medications PTA (aside from the abx taken at 2100).   She has recent hx of malignant neoplasm of the bladder. Currently undergoing BCG treatment with [facility]. Last treatment was on 08/29/24. Patient began having urinary frequency 2 days ago. She notes onset of burning dysuria earlier today for which she was seen at Urgent Care and given Bactrim  Rx for presumed UTI.  The history is provided by the patient. No language interpreter was used.  Allergic Reaction      Prior to Admission medications  Medication Sig Start Date End Date Taking? Authorizing Provider  acetaminophen  (TYLENOL ) 650 MG CR tablet Take 1,300 mg by mouth every 8 (eight) hours as needed for pain.    [provider]  alendronate  (FOSAMAX ) 70 MG tablet TAKE 1 TABLET BY MOUTH ONCE A WEEK WITH 8 OZ OF PLAIN WATER  30 MINUTES BEFORE FIRST FOOD, DRINK, OR MEDS. STAY UPRIGHT FOR 30 MINUTES 08/23/24   [name], DO  amLODipine  (NORVASC ) 5 MG tablet Take 1 tablet (5 mg total) by mouth at bedtime. 08/17/23   Bryan Bianchi, MD  anastrozole  (ARIMIDEX ) 1 MG tablet Take 1 tablet by mouth once daily 08/16/24   Crawford Morna Pickle, NP  apixaban  (ELIQUIS ) 5 MG TABS tablet Take 1 tablet (5 mg total) by mouth 2 (two) times daily.  08/23/24   [name], DO  atorvastatin  (LIPITOR) 40 MG tablet Take 1 tablet (40 mg total) by mouth daily. 08/23/24   [name], DO  baclofen  (LIORESAL ) 10 MG tablet Take 1 tablet (10 mg total) by mouth 3 (three) times daily. Patient taking differently: Take 10 mg by mouth daily as needed for muscle spasms. 01/29/24   Bryan Bianchi, MD  BLACK CURRANT SEED OIL PO Take by mouth.    [provider]  cephALEXin  (KEFLEX ) 500 MG capsule Take 1 capsule (500 mg total) by mouth 2 (two) times daily. 06/06/24   Matilda Senior, MD  Cholecalciferol 50 MCG (2000 UT) TABS Take 2,200 Units by mouth daily.    [provider]  diclofenac  Sodium (VOLTAREN ) 1 % GEL Apply 2 g topically 4 (four) times daily as needed (pain). 03/23/23   Bryan Bianchi, MD  Ferrous Sulfate (IRON  PO) Take 25 mg by mouth. Gentle Iron  from [employer]    [provider]  fish oil-omega-3 fatty acids 1000 MG capsule Take 1 g by mouth daily.    [provider]  ketoconazole  (NIZORAL ) 2 % cream Apply to both feet once daily for 4 weeks. 12/24/23   Magdalen Pasco RAMAN, DPM  losartan  (COZAAR ) 100 MG tablet Take 1 tablet (100 mg total) by mouth daily. 07/01/24 12/28/24  [name], DO  metoprolol  tartrate (LOPRESSOR ) 25 MG tablet Take 1 tablet by mouth twice daily 08/09/24   Miller, Samantha, DO  omeprazole  (PRILOSEC) 20 MG capsule  Take 1 capsule (20 mg total) by mouth daily. 05/03/21   Palumbo, April, MD  ondansetron  (ZOFRAN -ODT) 4 MG disintegrating tablet Take 1 tablet (4 mg total) by mouth every 8 (eight) hours as needed for nausea or vomiting. 08/17/23   Bryan Bianchi, MD  oxybutynin  (DITROPAN ) 5 MG tablet 1 tablet p.o. every 8 hours as needed bladder spasms 08/22/24   Matilda Senior, MD  polyethylene glycol powder (MIRALAX ) 17 GM/SCOOP powder Take 17 g by mouth daily as needed (constipation). 08/17/23   Bryan Bianchi, MD  sulfamethoxazole -trimethoprim  (BACTRIM  DS) 800-160 MG tablet Take 1  tablet by mouth 2 (two) times daily for 3 days. 09/09/24 09/12/24  Zelaya, Oscar A, PA-C  traMADol  (ULTRAM ) 50 MG tablet TAKE 1 TABLET BY MOUTH EVERY 6 HOURS AS NEEDED 08/16/24   [name], DO  vitamin C (ASCORBIC ACID) 500 MG tablet Take 500 mg by mouth daily.    [provider]    Allergies: Ciprofloxacin  and Lisinopril    Review of Systems Ten systems reviewed and are negative for acute change, except as noted in the HPI.    Updated Vital Signs BP (!) 155/67 (BP Location: Right Arm)   Pulse 91   Temp 98 F (36.7 C)   Resp 18   SpO2 96%   Physical Exam Vitals and nursing note reviewed.  Constitutional:      General: She is not in acute distress.    Appearance: She is well-developed. She is not diaphoretic.     Comments: Nontoxic appearing and in NAD  HENT:     Head: Normocephalic and atraumatic.     Right Ear: External ear normal.     Left Ear: External ear normal.     Mouth/Throat:     Mouth: Mucous membranes are moist.     Comments: No angioedema of lips or tongue.  Posterior oropharynx is clear.  She has normal phonation and is tolerating secretions without issue.  No tripoding or stridor noted. Eyes:     General: No scleral icterus.    Conjunctiva/sclera: Conjunctivae normal.  Cardiovascular:     Rate and Rhythm: Normal rate and regular rhythm.     Pulses: Normal pulses.  Pulmonary:     Effort: Pulmonary effort is normal. No respiratory distress.     Breath sounds: Normal breath sounds. No stridor. No wheezing.     Comments: Lungs CTAB. Respirations even and unlabored. Musculoskeletal:        General: Normal range of motion.     Cervical back: Normal range of motion.  Skin:    General: Skin is warm and dry.     Coloration: Skin is not pale.     Findings: No erythema or rash.  Neurological:     Mental Status: She is alert and oriented to person, place, and time.     Coordination: Coordination normal.     Comments: GCS 15.  Moving all extremities  spontaneously.  Psychiatric:        Mood and Affect: Mood is anxious.        Behavior: Behavior normal.     (all labs ordered are listed, but only abnormal results are displayed) Labs Reviewed - No data to display  EKG: None  Radiology: No results found.  {Document cardiac monitor, telemetry assessment procedure when appropriate:32947} Procedures   Medications Ordered in the ED  diphenhydrAMINE  (BENADRYL ) capsule 25 mg (has no administration in time range)  famotidine  (PEPCID ) tablet 20 mg (has no administration in time range)      {  Click here for ABCD2, HEART and other calculators REFRESH Note before signing:1}                              Medical Decision Making  ***  {Document critical care time when appropriate  Document review of labs and clinical decision tools ie CHADS2VASC2, etc  Document your independent review of radiology images and any outside records  Document your discussion with family members, caretakers and with consultants  Document social determinants of health affecting pt's care  Document your decision making why or why not admission, treatments were needed:32947:::1}   Final diagnoses:  None    ED Discharge Orders     None        "

## 2024-09-09 NOTE — ED Triage Notes (Signed)
 Pt being treated for bladder infection and took first dose of her Macrobid at 2030 and is now cmplaining of SOB and throat swelling. Pt is able to speak full sentences.

## 2024-09-10 LAB — URINE CULTURE

## 2024-09-10 MED ORDER — CEPHALEXIN 500 MG PO CAPS: 500.0000 mg | ORAL_CAPSULE | Freq: Two times a day (BID) | ORAL | 0 refills | Status: AC

## 2024-09-10 NOTE — Discharge Instructions (Addendum)
 Discontinue Bactrim .  Instead, take Keflex  as prescribed.  You have had this antibiotic in the past and tolerated it well.  Continue follow-up with urology.  You may return to the ED for new or concerning symptoms.

## 2024-09-11 LAB — URINE CULTURE: Culture: >=100000 — AB

## 2024-09-12 ENCOUNTER — Ambulatory Visit: Admitting: Urology

## 2024-09-12 ENCOUNTER — Ambulatory Visit (HOSPITAL_COMMUNITY): Payer: Self-pay

## 2024-09-12 DIAGNOSIS — C673 Malignant neoplasm of anterior wall of bladder: Secondary | ICD-10-CM

## 2024-09-13 ENCOUNTER — Telehealth: Payer: Self-pay | Admitting: Urology

## 2024-09-13 NOTE — Telephone Encounter (Signed)
 Spoke with Jalaine this morning, she stated that the new ABX was helping her symptoms and she was feeling better. She has 4 more days left of the medication. She asked if you would give her a call to discuss her BCG treatments.  Thanks, Rosaline

## 2024-09-14 NOTE — Telephone Encounter (Signed)
 Called and spoke with patient she stated that she is taking the medication that she was given at the ED that has helped. Informed her of her results of her culture she stated that she understood results However she was afraid to start treatment again because of the UTI. She stated that she said that she would like to discuss this w/ Dr Matilda before doing another treatment.

## 2024-09-19 ENCOUNTER — Ambulatory Visit: Admitting: Cardiology

## 2024-09-19 ENCOUNTER — Ambulatory Visit: Admitting: Urology

## 2024-09-22 ENCOUNTER — Ambulatory Visit: Payer: Self-pay | Admitting: Family Medicine

## 2024-09-26 ENCOUNTER — Ambulatory Visit: Admitting: Urology

## 2024-10-10 ENCOUNTER — Encounter

## 2025-06-30 ENCOUNTER — Inpatient Hospital Stay: Attending: Hematology and Oncology | Admitting: Hematology and Oncology
